# Patient Record
Sex: Female | Born: 1945 | Race: White | Hispanic: No | Marital: Married | Smoking: Never smoker
Health system: Southern US, Community
[De-identification: ages and names within clinical notes are randomized; demographics above are authoritative.]

## PROBLEM LIST (undated history)

## (undated) DIAGNOSIS — C50919 Malignant neoplasm of unspecified site of unspecified female breast: Secondary | ICD-10-CM

## (undated) DIAGNOSIS — M5416 Radiculopathy, lumbar region: Secondary | ICD-10-CM

## (undated) DIAGNOSIS — Z923 Personal history of irradiation: Secondary | ICD-10-CM

## (undated) DIAGNOSIS — M533 Sacrococcygeal disorders, not elsewhere classified: Secondary | ICD-10-CM

## (undated) DIAGNOSIS — E039 Hypothyroidism, unspecified: Secondary | ICD-10-CM

## (undated) DIAGNOSIS — E669 Obesity, unspecified: Secondary | ICD-10-CM

## (undated) DIAGNOSIS — G894 Chronic pain syndrome: Secondary | ICD-10-CM

## (undated) DIAGNOSIS — E538 Deficiency of other specified B group vitamins: Secondary | ICD-10-CM

## (undated) DIAGNOSIS — E119 Type 2 diabetes mellitus without complications: Secondary | ICD-10-CM

## (undated) DIAGNOSIS — D649 Anemia, unspecified: Secondary | ICD-10-CM

## (undated) DIAGNOSIS — I119 Hypertensive heart disease without heart failure: Secondary | ICD-10-CM

## (undated) DIAGNOSIS — I5189 Other ill-defined heart diseases: Secondary | ICD-10-CM

## (undated) DIAGNOSIS — M51369 Other intervertebral disc degeneration, lumbar region without mention of lumbar back pain or lower extremity pain: Secondary | ICD-10-CM

## (undated) DIAGNOSIS — Z9841 Cataract extraction status, right eye: Secondary | ICD-10-CM

## (undated) DIAGNOSIS — R0609 Other forms of dyspnea: Secondary | ICD-10-CM

## (undated) DIAGNOSIS — M5136 Other intervertebral disc degeneration, lumbar region: Secondary | ICD-10-CM

## (undated) DIAGNOSIS — C44711 Basal cell carcinoma of skin of unspecified lower limb, including hip: Secondary | ICD-10-CM

## (undated) DIAGNOSIS — E8809 Other disorders of plasma-protein metabolism, not elsewhere classified: Secondary | ICD-10-CM

## (undated) DIAGNOSIS — M1990 Unspecified osteoarthritis, unspecified site: Secondary | ICD-10-CM

## (undated) DIAGNOSIS — M7750 Other enthesopathy of unspecified foot: Secondary | ICD-10-CM

## (undated) DIAGNOSIS — E0590 Thyrotoxicosis, unspecified without thyrotoxic crisis or storm: Secondary | ICD-10-CM

## (undated) DIAGNOSIS — E559 Vitamin D deficiency, unspecified: Secondary | ICD-10-CM

## (undated) DIAGNOSIS — R600 Localized edema: Secondary | ICD-10-CM

## (undated) DIAGNOSIS — I499 Cardiac arrhythmia, unspecified: Secondary | ICD-10-CM

## (undated) DIAGNOSIS — I493 Ventricular premature depolarization: Secondary | ICD-10-CM

## (undated) DIAGNOSIS — M5126 Other intervertebral disc displacement, lumbar region: Secondary | ICD-10-CM

## (undated) DIAGNOSIS — I10 Essential (primary) hypertension: Secondary | ICD-10-CM

## (undated) DIAGNOSIS — E079 Disorder of thyroid, unspecified: Secondary | ICD-10-CM

## (undated) DIAGNOSIS — I890 Lymphedema, not elsewhere classified: Secondary | ICD-10-CM

## (undated) DIAGNOSIS — C801 Malignant (primary) neoplasm, unspecified: Secondary | ICD-10-CM

## (undated) DIAGNOSIS — M461 Sacroiliitis, not elsewhere classified: Secondary | ICD-10-CM

## (undated) DIAGNOSIS — R011 Cardiac murmur, unspecified: Secondary | ICD-10-CM

## (undated) DIAGNOSIS — M47816 Spondylosis without myelopathy or radiculopathy, lumbar region: Secondary | ICD-10-CM

## (undated) DIAGNOSIS — E785 Hyperlipidemia, unspecified: Secondary | ICD-10-CM

## (undated) DIAGNOSIS — I272 Pulmonary hypertension, unspecified: Secondary | ICD-10-CM

## (undated) DIAGNOSIS — M179 Osteoarthritis of knee, unspecified: Secondary | ICD-10-CM

## (undated) DIAGNOSIS — D0511 Intraductal carcinoma in situ of right breast: Secondary | ICD-10-CM

## (undated) DIAGNOSIS — M48061 Spinal stenosis, lumbar region without neurogenic claudication: Secondary | ICD-10-CM

## (undated) DIAGNOSIS — N183 Chronic kidney disease, stage 3 unspecified: Secondary | ICD-10-CM

## (undated) DIAGNOSIS — I071 Rheumatic tricuspid insufficiency: Secondary | ICD-10-CM

## (undated) DIAGNOSIS — C44309 Unspecified malignant neoplasm of skin of other parts of face: Secondary | ICD-10-CM

## (undated) HISTORY — DX: Type 2 diabetes mellitus without complications: E11.9

## (undated) HISTORY — PX: EYE SURGERY: SHX253

## (undated) HISTORY — PX: TOTAL KNEE ARTHROPLASTY: SHX125

## (undated) HISTORY — DX: Disorder of thyroid, unspecified: E07.9

## (undated) HISTORY — PX: KNEE ARTHROSCOPY: SUR90

## (undated) HISTORY — PX: KNEE SURGERY: SHX244

## (undated) HISTORY — DX: Rheumatic tricuspid insufficiency: I07.1

## (undated) HISTORY — DX: Osteoarthritis of knee, unspecified: M17.9

## (undated) HISTORY — DX: Hypertensive heart disease without heart failure: I11.9

## (undated) HISTORY — DX: Hyperlipidemia, unspecified: E78.5

## (undated) HISTORY — PX: SKIN CANCER EXCISION: SHX779

## (undated) HISTORY — DX: Sacroiliitis, not elsewhere classified: M46.1

## (undated) HISTORY — PX: CATARACT EXTRACTION: SUR2

## (undated) HISTORY — DX: Cardiac arrhythmia, unspecified: I49.9

## (undated) HISTORY — DX: Essential (primary) hypertension: I10

## (undated) HISTORY — DX: Vitamin D deficiency, unspecified: E55.9

## (undated) HISTORY — PX: JOINT REPLACEMENT: SHX530

## (undated) HISTORY — DX: Malignant neoplasm of unspecified site of unspecified female breast: C50.919

## (undated) HISTORY — DX: Obesity, unspecified: E66.9

## (undated) HISTORY — DX: Intraductal carcinoma in situ of right breast: D05.11

---

## 2000-11-20 ENCOUNTER — Other Ambulatory Visit: Admission: RE | Admit: 2000-11-20 | Discharge: 2000-11-20 | Payer: Self-pay | Admitting: Family Medicine

## 2005-04-18 ENCOUNTER — Ambulatory Visit: Payer: Self-pay | Admitting: General Practice

## 2005-05-27 ENCOUNTER — Other Ambulatory Visit: Payer: Self-pay

## 2005-06-03 ENCOUNTER — Ambulatory Visit: Payer: Self-pay | Admitting: General Practice

## 2006-07-14 ENCOUNTER — Ambulatory Visit: Payer: Self-pay

## 2007-06-26 ENCOUNTER — Ambulatory Visit: Payer: Self-pay | Admitting: Sports Medicine

## 2007-06-26 ENCOUNTER — Encounter (INDEPENDENT_AMBULATORY_CARE_PROVIDER_SITE_OTHER): Payer: Self-pay | Admitting: *Deleted

## 2007-06-26 DIAGNOSIS — M7750 Other enthesopathy of unspecified foot: Secondary | ICD-10-CM

## 2007-10-16 ENCOUNTER — Encounter: Payer: Self-pay | Admitting: Family Medicine

## 2007-10-16 ENCOUNTER — Ambulatory Visit: Payer: Self-pay | Admitting: Sports Medicine

## 2007-10-17 ENCOUNTER — Encounter: Payer: Self-pay | Admitting: Sports Medicine

## 2007-11-14 ENCOUNTER — Ambulatory Visit: Payer: Self-pay | Admitting: Sports Medicine

## 2007-11-14 DIAGNOSIS — M7660 Achilles tendinitis, unspecified leg: Secondary | ICD-10-CM

## 2007-11-14 DIAGNOSIS — E669 Obesity, unspecified: Secondary | ICD-10-CM | POA: Insufficient documentation

## 2008-01-21 ENCOUNTER — Ambulatory Visit: Payer: Self-pay | Admitting: Family Medicine

## 2008-03-12 ENCOUNTER — Ambulatory Visit: Payer: Self-pay | Admitting: Sports Medicine

## 2008-04-22 LAB — HM DEXA SCAN: HM Dexa Scan: NORMAL

## 2008-04-23 ENCOUNTER — Ambulatory Visit: Payer: Self-pay | Admitting: Sports Medicine

## 2008-04-23 LAB — HM PAP SMEAR: HM Pap smear: NORMAL

## 2008-05-08 ENCOUNTER — Ambulatory Visit: Payer: Self-pay | Admitting: Family Medicine

## 2009-05-19 ENCOUNTER — Ambulatory Visit: Payer: Self-pay | Admitting: Family Medicine

## 2010-02-16 ENCOUNTER — Ambulatory Visit: Payer: Self-pay | Admitting: Family Medicine

## 2010-08-23 ENCOUNTER — Encounter: Payer: Self-pay | Admitting: *Deleted

## 2011-07-11 ENCOUNTER — Ambulatory Visit: Payer: Self-pay | Admitting: General Practice

## 2011-07-11 LAB — CBC
HGB: 12.1 g/dL (ref 12.0–16.0)
MCH: 27.8 pg (ref 26.0–34.0)
Platelet: 258 10*3/uL (ref 150–440)

## 2011-07-11 LAB — BASIC METABOLIC PANEL
Anion Gap: 13 (ref 7–16)
Creatinine: 1.03 mg/dL (ref 0.60–1.30)
Glucose: 221 mg/dL — ABNORMAL HIGH (ref 65–99)
Osmolality: 292 (ref 275–301)
Sodium: 142 mmol/L (ref 136–145)

## 2011-07-11 LAB — PROTIME-INR
INR: 1
Prothrombin Time: 13.4 secs (ref 11.5–14.7)

## 2011-07-11 LAB — URINALYSIS, COMPLETE
Glucose,UR: NEGATIVE mg/dL (ref 0–75)
Hyaline Cast: 6
Ketone: NEGATIVE
Nitrite: NEGATIVE
RBC,UR: NONE SEEN /HPF (ref 0–5)
Specific Gravity: 1.015 (ref 1.003–1.030)
WBC UR: 2 /HPF (ref 0–5)

## 2011-07-11 LAB — SEDIMENTATION RATE: Erythrocyte Sed Rate: 20 mm/hr (ref 0–30)

## 2011-07-11 LAB — MRSA PCR SCREENING

## 2011-07-12 LAB — URINE CULTURE

## 2011-07-25 ENCOUNTER — Inpatient Hospital Stay: Payer: Self-pay | Admitting: General Practice

## 2011-07-26 LAB — BASIC METABOLIC PANEL
Anion Gap: 10 (ref 7–16)
Calcium, Total: 8.4 mg/dL — ABNORMAL LOW (ref 8.5–10.1)
Chloride: 109 mmol/L — ABNORMAL HIGH (ref 98–107)
Creatinine: 1.13 mg/dL (ref 0.60–1.30)
EGFR (Non-African Amer.): 52 — ABNORMAL LOW
Glucose: 125 mg/dL — ABNORMAL HIGH (ref 65–99)
Osmolality: 292 (ref 275–301)
Potassium: 4 mmol/L (ref 3.5–5.1)
Sodium: 143 mmol/L (ref 136–145)

## 2011-07-26 LAB — PLATELET COUNT: Platelet: 230 10*3/uL (ref 150–440)

## 2011-07-26 LAB — HEMOGLOBIN: HGB: 10.4 g/dL — ABNORMAL LOW (ref 12.0–16.0)

## 2011-07-27 LAB — BASIC METABOLIC PANEL
Anion Gap: 12 (ref 7–16)
BUN: 29 mg/dL — ABNORMAL HIGH (ref 7–18)
Calcium, Total: 8.2 mg/dL — ABNORMAL LOW (ref 8.5–10.1)
Co2: 19 mmol/L — ABNORMAL LOW (ref 21–32)
Creatinine: 1.32 mg/dL — ABNORMAL HIGH (ref 0.60–1.30)
Glucose: 166 mg/dL — ABNORMAL HIGH (ref 65–99)
Potassium: 4.1 mmol/L (ref 3.5–5.1)

## 2011-07-27 LAB — HEMOGLOBIN: HGB: 10.1 g/dL — ABNORMAL LOW (ref 12.0–16.0)

## 2011-07-28 ENCOUNTER — Encounter: Payer: Self-pay | Admitting: Internal Medicine

## 2011-07-28 LAB — BASIC METABOLIC PANEL
Anion Gap: 11 (ref 7–16)
Calcium, Total: 8.5 mg/dL (ref 8.5–10.1)
Chloride: 106 mmol/L (ref 98–107)
EGFR (African American): 54 — ABNORMAL LOW
Osmolality: 290 (ref 275–301)
Potassium: 4.2 mmol/L (ref 3.5–5.1)

## 2011-08-01 ENCOUNTER — Encounter: Payer: Self-pay | Admitting: Internal Medicine

## 2011-08-30 ENCOUNTER — Ambulatory Visit: Payer: Self-pay | Admitting: Family Medicine

## 2011-08-31 ENCOUNTER — Encounter: Payer: Self-pay | Admitting: Internal Medicine

## 2011-08-31 ENCOUNTER — Ambulatory Visit: Payer: Self-pay | Admitting: Family Medicine

## 2011-08-31 LAB — CREATININE, SERUM
Creatinine: 1.17 mg/dL (ref 0.60–1.30)
EGFR (Non-African Amer.): 49 — ABNORMAL LOW

## 2011-11-07 DIAGNOSIS — M7989 Other specified soft tissue disorders: Secondary | ICD-10-CM | POA: Diagnosis not present

## 2011-11-07 DIAGNOSIS — I890 Lymphedema, not elsewhere classified: Secondary | ICD-10-CM | POA: Diagnosis not present

## 2011-11-07 DIAGNOSIS — I872 Venous insufficiency (chronic) (peripheral): Secondary | ICD-10-CM | POA: Diagnosis not present

## 2011-11-07 DIAGNOSIS — M79609 Pain in unspecified limb: Secondary | ICD-10-CM | POA: Diagnosis not present

## 2011-11-09 DIAGNOSIS — Z1331 Encounter for screening for depression: Secondary | ICD-10-CM | POA: Diagnosis not present

## 2011-11-09 DIAGNOSIS — Z1339 Encounter for screening examination for other mental health and behavioral disorders: Secondary | ICD-10-CM | POA: Diagnosis not present

## 2011-11-09 DIAGNOSIS — Z0000 Encounter for general adult medical examination without abnormal findings: Secondary | ICD-10-CM | POA: Diagnosis not present

## 2011-11-09 DIAGNOSIS — E7800 Pure hypercholesterolemia, unspecified: Secondary | ICD-10-CM | POA: Diagnosis not present

## 2011-11-09 DIAGNOSIS — E119 Type 2 diabetes mellitus without complications: Secondary | ICD-10-CM | POA: Diagnosis not present

## 2011-11-09 DIAGNOSIS — R0989 Other specified symptoms and signs involving the circulatory and respiratory systems: Secondary | ICD-10-CM | POA: Diagnosis not present

## 2011-11-11 DIAGNOSIS — D045 Carcinoma in situ of skin of trunk: Secondary | ICD-10-CM | POA: Diagnosis not present

## 2011-11-14 DIAGNOSIS — M999 Biomechanical lesion, unspecified: Secondary | ICD-10-CM | POA: Diagnosis not present

## 2011-11-14 DIAGNOSIS — M5380 Other specified dorsopathies, site unspecified: Secondary | ICD-10-CM | POA: Diagnosis not present

## 2011-11-14 DIAGNOSIS — M549 Dorsalgia, unspecified: Secondary | ICD-10-CM | POA: Diagnosis not present

## 2011-11-14 DIAGNOSIS — M5137 Other intervertebral disc degeneration, lumbosacral region: Secondary | ICD-10-CM | POA: Diagnosis not present

## 2011-12-06 ENCOUNTER — Ambulatory Visit: Payer: Self-pay | Admitting: Family Medicine

## 2011-12-06 DIAGNOSIS — Z1231 Encounter for screening mammogram for malignant neoplasm of breast: Secondary | ICD-10-CM | POA: Diagnosis not present

## 2011-12-06 LAB — HM MAMMOGRAPHY: HM MAMMO: NORMAL

## 2012-01-19 ENCOUNTER — Ambulatory Visit: Payer: Self-pay | Admitting: Gastroenterology

## 2012-01-19 LAB — HM COLONOSCOPY: HM Colonoscopy: NORMAL

## 2012-02-07 DIAGNOSIS — M549 Dorsalgia, unspecified: Secondary | ICD-10-CM | POA: Diagnosis not present

## 2012-02-07 DIAGNOSIS — M5137 Other intervertebral disc degeneration, lumbosacral region: Secondary | ICD-10-CM | POA: Diagnosis not present

## 2012-02-07 DIAGNOSIS — M999 Biomechanical lesion, unspecified: Secondary | ICD-10-CM | POA: Diagnosis not present

## 2012-02-07 DIAGNOSIS — M5380 Other specified dorsopathies, site unspecified: Secondary | ICD-10-CM | POA: Diagnosis not present

## 2012-02-27 ENCOUNTER — Ambulatory Visit: Payer: Self-pay | Admitting: Family Medicine

## 2012-07-18 DIAGNOSIS — E1139 Type 2 diabetes mellitus with other diabetic ophthalmic complication: Secondary | ICD-10-CM | POA: Diagnosis not present

## 2013-06-10 DIAGNOSIS — D485 Neoplasm of uncertain behavior of skin: Secondary | ICD-10-CM | POA: Diagnosis not present

## 2013-07-22 ENCOUNTER — Ambulatory Visit: Payer: Self-pay | Admitting: General Practice

## 2013-12-11 DIAGNOSIS — I493 Ventricular premature depolarization: Secondary | ICD-10-CM | POA: Insufficient documentation

## 2014-02-14 ENCOUNTER — Other Ambulatory Visit: Payer: Self-pay

## 2014-02-24 DIAGNOSIS — Z23 Encounter for immunization: Secondary | ICD-10-CM | POA: Diagnosis not present

## 2014-02-27 DIAGNOSIS — D2261 Melanocytic nevi of right upper limb, including shoulder: Secondary | ICD-10-CM | POA: Diagnosis not present

## 2014-02-27 DIAGNOSIS — D225 Melanocytic nevi of trunk: Secondary | ICD-10-CM | POA: Diagnosis not present

## 2014-02-27 DIAGNOSIS — D2262 Melanocytic nevi of left upper limb, including shoulder: Secondary | ICD-10-CM | POA: Diagnosis not present

## 2014-02-27 DIAGNOSIS — Z85828 Personal history of other malignant neoplasm of skin: Secondary | ICD-10-CM | POA: Diagnosis not present

## 2014-02-27 DIAGNOSIS — C4441 Basal cell carcinoma of skin of scalp and neck: Secondary | ICD-10-CM | POA: Diagnosis not present

## 2014-02-27 DIAGNOSIS — D485 Neoplasm of uncertain behavior of skin: Secondary | ICD-10-CM | POA: Diagnosis not present

## 2014-03-17 DIAGNOSIS — E039 Hypothyroidism, unspecified: Secondary | ICD-10-CM | POA: Diagnosis not present

## 2014-03-17 DIAGNOSIS — M1990 Unspecified osteoarthritis, unspecified site: Secondary | ICD-10-CM | POA: Diagnosis not present

## 2014-03-17 DIAGNOSIS — E780 Pure hypercholesterolemia: Secondary | ICD-10-CM | POA: Diagnosis not present

## 2014-03-17 DIAGNOSIS — I10 Essential (primary) hypertension: Secondary | ICD-10-CM | POA: Diagnosis not present

## 2014-03-17 DIAGNOSIS — E669 Obesity, unspecified: Secondary | ICD-10-CM | POA: Diagnosis not present

## 2014-03-17 DIAGNOSIS — E785 Hyperlipidemia, unspecified: Secondary | ICD-10-CM | POA: Diagnosis not present

## 2014-03-17 DIAGNOSIS — E119 Type 2 diabetes mellitus without complications: Secondary | ICD-10-CM | POA: Diagnosis not present

## 2014-03-17 DIAGNOSIS — E559 Vitamin D deficiency, unspecified: Secondary | ICD-10-CM | POA: Diagnosis not present

## 2014-03-17 LAB — CBC AND DIFFERENTIAL
HEMATOCRIT: 34 % — AB (ref 36–46)
HEMOGLOBIN: 10.8 g/dL — AB (ref 12.0–16.0)
NEUTROS ABS: 61 /uL
PLATELETS: 276 10*3/uL (ref 150–399)
WBC: 10.8 10^3/mL

## 2014-03-17 LAB — BASIC METABOLIC PANEL
BUN: 28 mg/dL — AB (ref 4–21)
Creatinine: 1.1 mg/dL (ref <=1.1)
GLUCOSE: 103 mg/dL
Potassium: 4.9 mmol/L (ref 3.4–5.3)
SODIUM: 142 mmol/L (ref 137–147)

## 2014-03-17 LAB — HEPATIC FUNCTION PANEL
ALT: 7 U/L (ref 7–35)
AST: 16 U/L (ref 13–35)
Alkaline Phosphatase: 63 U/L (ref 25–125)
Bilirubin, Total: 0.4 mg/dL

## 2014-03-17 LAB — LIPID PANEL
CHOLESTEROL: 121 mg/dL (ref 0–200)
HDL: 61 mg/dL (ref 35–70)
LDL CALC: 47 mg/dL
LDL/HDL RATIO: 0.8
TRIGLYCERIDES: 65 mg/dL (ref 40–160)

## 2014-03-17 LAB — TSH: TSH: 2.41 u[IU]/mL (ref <=5.90)

## 2014-03-17 LAB — HEMOGLOBIN A1C: HEMOGLOBIN A1C: 6.6 % — AB (ref 4.0–6.0)

## 2014-04-22 DIAGNOSIS — C4441 Basal cell carcinoma of skin of scalp and neck: Secondary | ICD-10-CM | POA: Diagnosis not present

## 2014-07-03 DIAGNOSIS — D2262 Melanocytic nevi of left upper limb, including shoulder: Secondary | ICD-10-CM | POA: Diagnosis not present

## 2014-07-03 DIAGNOSIS — D2261 Melanocytic nevi of right upper limb, including shoulder: Secondary | ICD-10-CM | POA: Diagnosis not present

## 2014-07-03 DIAGNOSIS — D225 Melanocytic nevi of trunk: Secondary | ICD-10-CM | POA: Diagnosis not present

## 2014-07-03 DIAGNOSIS — Z85828 Personal history of other malignant neoplasm of skin: Secondary | ICD-10-CM | POA: Diagnosis not present

## 2014-07-08 DIAGNOSIS — E780 Pure hypercholesterolemia: Secondary | ICD-10-CM | POA: Diagnosis not present

## 2014-07-08 DIAGNOSIS — E119 Type 2 diabetes mellitus without complications: Secondary | ICD-10-CM | POA: Diagnosis not present

## 2014-07-08 DIAGNOSIS — I10 Essential (primary) hypertension: Secondary | ICD-10-CM | POA: Diagnosis not present

## 2014-07-08 DIAGNOSIS — M1990 Unspecified osteoarthritis, unspecified site: Secondary | ICD-10-CM | POA: Diagnosis not present

## 2014-07-08 DIAGNOSIS — Z96652 Presence of left artificial knee joint: Secondary | ICD-10-CM | POA: Diagnosis not present

## 2014-07-08 DIAGNOSIS — E669 Obesity, unspecified: Secondary | ICD-10-CM | POA: Diagnosis not present

## 2014-08-05 DIAGNOSIS — E11329 Type 2 diabetes mellitus with mild nonproliferative diabetic retinopathy without macular edema: Secondary | ICD-10-CM | POA: Diagnosis not present

## 2014-08-05 DIAGNOSIS — H524 Presbyopia: Secondary | ICD-10-CM | POA: Diagnosis not present

## 2014-08-24 NOTE — Op Note (Signed)
PATIENT NAME:  Carol Ramsey, Carol Ramsey MR#:  [id number] DATE OF BIRTH:  [birth year]  DATE OF PROCEDURE:  07/25/2011  PREOPERATIVE DIAGNOSIS: Degenerative arthrosis of the left knee.   POSTOPERATIVE DIAGNOSIS: Degenerative arthrosis of the left knee.   PROCEDURE PERFORMED: Left total knee arthroplasty using computer-assisted navigation.   SURGEON: Laurice Record. Holley Bouche., MD    ASSISTANT: Vance Peper, PA-C (required to maintain retraction throughout the procedure)   ANESTHESIA: Femoral nerve block and general.   ESTIMATED BLOOD LOSS: 50 mL.   FLUIDS REPLACED: 2000 mL of Crystalloid.   TOURNIQUET TIME: 92 minutes.   DRAINS: Two medium drains to reinfusion system.   SOFT TISSUE RELEASES: Anterior cruciate ligament, posterior cruciate ligament, deep medial collateral ligament, and patellofemoral ligament.   IMPLANTS UTILIZED: DePuy PFC Sigma size 3 posterior stabilized femoral component (cemented), size 3 MBT tibial component (cemented), 32 mm three peg oval dome patella (cemented), and a 12.5 mm stabilized rotating platform polyethylene insert.   INDICATIONS FOR SURGERY: The patient is a 69 year old female who has been seen for complaints of progressive left knee pain. X-rays demonstrated severe degenerative changes in a tricompartmental fashion with relative valgus deformity. After discussion of the risks and benefits of surgical intervention, the patient expressed her understanding of the risks and benefits and agreed with plans for surgical intervention.   PROCEDURE IN DETAIL: The patient was brought into the operating room and, after adequate femoral nerve block and general anesthesia was achieved, a tourniquet was placed on the patient's upper left thigh. The patient's left knee and leg were cleaned and prepped with alcohol and DuraPrep and draped in the usual sterile fashion. A "time-out" was performed as per usual protocol. The left lower extremity was exsanguinated using an Esmarch and the  tourniquet was inflated to 300 mmHg. Anterior longitudinal incision was made followed by a standard mid vastus approach. A large effusion was evacuated. Deep fibers of the medial collateral ligament were elevated in a subperiosteal fashion off the medial flare of the tibia so as to maintain a continuous soft tissue sleeve. The patella was subluxed laterally and the patellofemoral ligament was incised. Inspection of the knee demonstrated severe degenerative changes with evidence of eburnated bone noted to the lateral compartment. Prominent osteophytes were debrided using a rongeur. Anterior and posterior cruciate ligaments were excised. Two 4.0 mm Schanz pins were inserted into the femur and into the tibia for attachment of the array of spheres used for computer-assisted navigation. Hip center was identified using circumduction technique. Distal landmarks were mapped using the computer. Distal femur and proximal tibia were mapped using the computer. Distal femoral cutting guide was positioned using computer-assisted navigation so as to achieve 5 degree distal valgus cut. Cut was performed and verified using the computer. Distal femur was sized and it was felt that a size 3 femoral component was appropriate. Size 3 cutting guide was positioned using computer-assisted navigation and the anterior cut was performed and verified using the computer. This was followed by completion of the posterior and chamfer cuts. Femoral cutting guide for central box was then positioned and central box cut was performed.   Attention was then directed to the proximal tibia. Medial and lateral menisci were excised. The extramedullary tibial cutting guide was positioned using computer-assisted navigation so as to achieve 0 degree varus valgus alignment and 0 degree posterior slope. Cut was performed and verified using the computer. The proximal tibia was sized and it was felt that a size 3 tibial tray was appropriate. Tibial  and femoral  trials were inserted followed by insertion of first a 10 and subsequently a 12.5 mm polyethylene trial. This allowed for excellent mediolateral soft tissue balancing both in full extension and in 90 degrees of flexion. Finally, the patella was cut and prepared so as to accommodate a 32 mm three peg oval dome patella. Patellar trial was placed and the knee was placed through a range of motion with excellent patellar tracking appreciated.   Femoral trial was removed. Central post hole for the tibial component was reamed followed by insertion of keel punch. Tibial trial was then removed. Cut surfaces of bone were irrigated with copious amounts of normal saline with antibiotic solution using pulsatile lavage and then suctioned dry. Polymethyl methacrylate cement with gentamicin was mixed in the usual fashion using a vacuum mixer. Cement with gentamicin was utilized due to the patient's history of diabetes as well as elevated body mass index. Cement was applied to the cut surface of the proximal tibia as well as along the undersurface of a size 3 MBT tibial component. Tibial component was positioned and impacted into place. Excess cement was removed using freer elevators. Cement was then applied to the cut surface of the femur as well as along the posterior flanges of size 3 posterior stabilized femoral component. Femoral component was positioned and impacted into place. Excess cement was removed using freer elevators. A 12.5 mm stabilized polyethylene trial was inserted and the knee was brought in full extension with steady axial compression applied. Finally, cement was applied to the backside of a 32 mm three peg oval dome patella and patellar component was positioned and patellar clamp applied. Excess cement was removed using freer elevators.   After adequate curing of cement, the tourniquet was deflated after a total tourniquet time of 92 minutes. Hemostasis was achieved using electrocautery. Knee was irrigated  with copious amounts of normal saline with antibiotic solution using pulsatile lavage and then suctioned dry. The knee was inspected for any residual cement debris. 30 mL of 0.25% Marcaine with epinephrine was injected along the posterior capsule. A 12.5 mm stabilized rotating platform polyethylene insert was positioned and the knee was placed through a range of motion. Excellent patellar tracking was appreciated and excellent mediolateral soft tissue balancing was appreciated.   Two medium drains were placed in the wound bed and brought out through a separate stab incision to be attached to a reinfusion system. The medial parapatellar portion of the incision was reapproximated using interrupted sutures of #1 Vicryl. Subcutaneous tissue was approximated in layers using first #0 Vicryl followed by #2-0 Vicryl. Skin was closed with skin staples. Sterile dressing was applied.   The patient tolerated the procedure well. She was transported to the recovery room in stable condition.   ____________________________ Laurice Record. Holley Bouche., MD [id number] D: 07/25/2011 22:29:19 ET T: 07/26/2011 10:25:13 ET JOB#: [id number]  cc: Laurice Record. Holley Bouche., MD, <Dictator> Laurice Record Holley Bouche MD ELECTRONICALLY SIGNED 07/27/2011 15:53

## 2014-08-24 NOTE — Discharge Summary (Signed)
PATIENT NAME:  Carol Ramsey, BISPING MR#:  [id number] DATE OF BIRTH:  December 08, 1945  DATE OF ADMISSION:  07/25/2011 DATE OF DISCHARGE:  07/28/2011  ADMITTING DIAGNOSIS: Degenerative arthrosis of the left knee.   DISCHARGE DIAGNOSIS: Degenerative arthrosis of the left knee.  HISTORY OF PRESENT ILLNESS: The patient is a pleasant 69 year old who has been followed at [facility] for progression of left knee pain. She reported approximately a one-year history of progressive left knee pain. She also had some similar but less severe pain to the right knee. She had localized most of the pain along the lateral aspect of the knee. Her pain was aggravated with weight-bearing activities. The patient also reported start-up stiffness and episodes of near giving way. She had not seen any significant improvement in her condition despite the use of anti-inflammatory medications. At the time of surgery, she was not using any ambulatory aid. The pain progressed to the point that it was significantly interfering with her activities of daily living. X-rays taken in the clinic showed narrowing of the lateral compartment space with associated valgus alignment. Osteophyte as well as subchondral sclerosis was noted. After discussion of the risks and benefits of surgical intervention, the patient expressed her understanding of the risks and benefits and agreed with plans for surgical intervention.   PROCEDURE: Left total knee arthroplasty using computer-assisted navigation.   ANESTHESIA: Femoral nerve block with general.   SOFT TISSUE RELEASE: Anterior cruciate ligament, posterior cruciate ligament, deep medial collateral ligament, as well as the patellofemoral ligament.   IMPLANTS UTILIZED: DePuy PFC Sigma size 3 posterior stabilized femoral component (cemented), size 3 MBT tibial component (cemented), 32 mm three pegged oval dome patella (cemented), and a 12.5 mm stabilized rotating platform polyethylene insert.   HOSPITAL  COURSE: The patient tolerated the procedure very well. She had no complications. She was then taken to the PAC-U where she was stabilized and then transferred to the orthopedic floor. She began receiving anticoagulation therapy of Lovenox 30 mg subcutaneous every 12 hours per anesthesia and pharmacy protocol. She was fitted with TED stockings bilaterally. These were allowed to be removed one hour per eight hour shift. The left one was applied on day two following removal of the Hemovac and dressing change. The patient was also fitted with the AV-I compression foot pumps bilaterally set at 80 mmHg. Her calves have been nontender and free of any evidence of any deep venous thromboses to the lower extremity. Heels were elevated off the bed using rolled towels. Heels have been nontender and there was no tissue breakdown noted.   The patient has denied any chest pain or shortness of breath. She has had no other complaints as well. Her vital signs have been stable. She has been afebrile. Hemodynamically she was stable and no transfusions were given, other than the Autovac transfusion given the first six hours postoperatively. Her laboratory studies did show some elevation of her BUN and creatinine and after some discussion with her it appeared that she had not been drinking a lot of fluids. She began taking fluids and BUN and creatinine improved. Her urine output however was within normal limits throughout the hospital course.   The patient began receiving physical therapy on day one for gait training and transfers. She has been a little slow with this. This is partially secondary to her size and deconditioning. Occupational therapy was also initiated for activities of daily living and assistive devices.   The patient's IV, catheter, and Hemovac were discontinued on day two  along with a dressing change. Polar Care was reapplied to the surgical leg maintaining a temperature of 40 to 50 degrees Fahrenheit.    DISPOSITION: The patient is being discharged to a skilled nursing facility in improved stable condition.   DISCHARGE INSTRUCTIONS:  1. She will continue with physical therapy for gait training and transfers. Occupational therapy for activities of daily living and assistive devices.  2. Continue TED stockings. These are allowed to be removed one hour per eight hour shift.  3. Incentive spirometer every 1 hour while awake.  4. Encourage cough and deep breathing every two hours while awake.  5. She is placed on an ADA 1800 calorie diet.  6. Knee immobilizer until she is able to do 10 straight leg raises.  7. She may weight bear as tolerated.  8. Polar Care to the surgical leg maintaining a temperature of 40 to 50 degrees Fahrenheit.  9. She has a follow-up appointment on 08/09/2011 at 9:00 a.m. with Vance Peper, PA and on 09/06/2011 at 9:30 with Dr. Skip Estimable. She is to call the clinic sooner if any temperatures of 101.5 or greater or excessive bleeding.   DRUG ALLERGIES: Amoxicillin, diclofenac, quinine.   DISCHARGE MEDICATIONS:  1. Naphazoline-glycerine 0.012-0.2% eyedrops, one drop to both eyes every 4 hours p.r.n. for eye irritation.  2. Tylenol ES 500 to 1000 mg every four hours p.r.n. for temperatures of 100.4 or greater.  3. Celebrex 200 mg twice a day. 4. Roxicodone 5 to 10 mg every 4 to 6 hours p.r.n. 5. Tramadol 50 to 100 mg every four hours p.r.n.  6. Dulcolax suppositories 10 mg rectally daily p.r.n. for constipation.  7. Milk of Magnesia 30 mL twice a day p.r.n.  8. Enema soapsuds if no results with milk of magnesia or Dulcolax.  9. Mylanta DS 30 mL every six hours p.r.n.  10. Pantoprazole 40 mg twice a day. 11. Senokot-S 1 tablet twice a day. 12. Pioglitazone 45 mg daily with meals. 13. Glucotrol XL 10 mg before breakfast.  14. Lovenox 30 mg subcutaneous every 12 hours for 14 days then discontinue and begin taking one 81 mg enteric-coated aspirin per day.  15. Benadryl  25 mg every six hours p.r.n.  16. Lactulose 10 grams/15 mL syrup, 30 mL twice a day p.r.n.  17. Insulin sliding scale, Novolin R. 18. Glucophage 1000 mg twice a day.  19. Januvia 50 mg usual schedule every day at 0700 and 1700. 20. Norvasc 5 mg daily. 21. Lisinopril 20 mg/ HCTZ 12.5 mg two tablets every  a.m.  22. Vitamin D3 1,000 units daily.   PAST MEDICAL HISTORY:  1. Chickenpox.  2. Hypertension.  3. Diabetes.  4. 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      5. Seasonal allergies.  6. Arthritis.  ____________________________ Vance Peper, PA [id number] D: 07/28/2011 06:59:00 ET T: 07/28/2011 07:18:51 ET JOB#: [id number]  cc: Vance Peper, PA, <Dictator> JON WOLFE PA ELECTRONICALLY SIGNED 07/31/2011 20:21

## 2014-09-17 DIAGNOSIS — E785 Hyperlipidemia, unspecified: Secondary | ICD-10-CM | POA: Insufficient documentation

## 2014-09-17 DIAGNOSIS — M47816 Spondylosis without myelopathy or radiculopathy, lumbar region: Secondary | ICD-10-CM | POA: Insufficient documentation

## 2014-09-17 DIAGNOSIS — I499 Cardiac arrhythmia, unspecified: Secondary | ICD-10-CM | POA: Insufficient documentation

## 2014-09-17 DIAGNOSIS — E119 Type 2 diabetes mellitus without complications: Secondary | ICD-10-CM | POA: Insufficient documentation

## 2014-09-17 DIAGNOSIS — D649 Anemia, unspecified: Secondary | ICD-10-CM | POA: Insufficient documentation

## 2014-09-17 DIAGNOSIS — F4320 Adjustment disorder, unspecified: Secondary | ICD-10-CM | POA: Insufficient documentation

## 2014-09-17 DIAGNOSIS — E559 Vitamin D deficiency, unspecified: Secondary | ICD-10-CM | POA: Insufficient documentation

## 2014-09-17 DIAGNOSIS — E1122 Type 2 diabetes mellitus with diabetic chronic kidney disease: Secondary | ICD-10-CM | POA: Insufficient documentation

## 2014-09-17 DIAGNOSIS — C44309 Unspecified malignant neoplasm of skin of other parts of face: Secondary | ICD-10-CM | POA: Insufficient documentation

## 2014-09-17 DIAGNOSIS — E039 Hypothyroidism, unspecified: Secondary | ICD-10-CM | POA: Insufficient documentation

## 2014-09-17 DIAGNOSIS — I10 Essential (primary) hypertension: Secondary | ICD-10-CM | POA: Insufficient documentation

## 2014-09-17 DIAGNOSIS — E668 Other obesity: Secondary | ICD-10-CM | POA: Insufficient documentation

## 2014-10-21 ENCOUNTER — Other Ambulatory Visit: Payer: Self-pay

## 2014-10-21 ENCOUNTER — Other Ambulatory Visit: Payer: Self-pay | Admitting: Family Medicine

## 2014-10-21 DIAGNOSIS — M5442 Lumbago with sciatica, left side: Secondary | ICD-10-CM | POA: Diagnosis not present

## 2014-10-21 DIAGNOSIS — M5416 Radiculopathy, lumbar region: Secondary | ICD-10-CM | POA: Diagnosis not present

## 2014-10-21 DIAGNOSIS — M9905 Segmental and somatic dysfunction of pelvic region: Secondary | ICD-10-CM | POA: Diagnosis not present

## 2014-10-21 DIAGNOSIS — M791 Myalgia: Secondary | ICD-10-CM | POA: Diagnosis not present

## 2014-10-21 DIAGNOSIS — E118 Type 2 diabetes mellitus with unspecified complications: Secondary | ICD-10-CM

## 2014-10-21 DIAGNOSIS — M9903 Segmental and somatic dysfunction of lumbar region: Secondary | ICD-10-CM | POA: Diagnosis not present

## 2014-10-21 DIAGNOSIS — M5136 Other intervertebral disc degeneration, lumbar region: Secondary | ICD-10-CM | POA: Diagnosis not present

## 2014-10-21 MED ORDER — PIOGLITAZONE HCL 45 MG PO TABS: 45.0000 mg | ORAL_TABLET | Freq: Every day | ORAL | Status: DC

## 2014-10-21 MED ORDER — SITAGLIPTIN PHOS-METFORMIN HCL 50-1000 MG PO TABS: 1.0000 | ORAL_TABLET | Freq: Two times a day (BID) | ORAL | Status: DC

## 2014-10-22 DIAGNOSIS — M791 Myalgia: Secondary | ICD-10-CM | POA: Diagnosis not present

## 2014-10-22 DIAGNOSIS — M9905 Segmental and somatic dysfunction of pelvic region: Secondary | ICD-10-CM | POA: Diagnosis not present

## 2014-10-22 DIAGNOSIS — M9903 Segmental and somatic dysfunction of lumbar region: Secondary | ICD-10-CM | POA: Diagnosis not present

## 2014-10-22 DIAGNOSIS — M5136 Other intervertebral disc degeneration, lumbar region: Secondary | ICD-10-CM | POA: Diagnosis not present

## 2014-10-22 DIAGNOSIS — M5442 Lumbago with sciatica, left side: Secondary | ICD-10-CM | POA: Diagnosis not present

## 2014-10-22 DIAGNOSIS — M5416 Radiculopathy, lumbar region: Secondary | ICD-10-CM | POA: Diagnosis not present

## 2014-10-23 DIAGNOSIS — M5442 Lumbago with sciatica, left side: Secondary | ICD-10-CM | POA: Diagnosis not present

## 2014-10-23 DIAGNOSIS — M5416 Radiculopathy, lumbar region: Secondary | ICD-10-CM | POA: Diagnosis not present

## 2014-10-23 DIAGNOSIS — M791 Myalgia: Secondary | ICD-10-CM | POA: Diagnosis not present

## 2014-10-23 DIAGNOSIS — M9905 Segmental and somatic dysfunction of pelvic region: Secondary | ICD-10-CM | POA: Diagnosis not present

## 2014-10-23 DIAGNOSIS — M9903 Segmental and somatic dysfunction of lumbar region: Secondary | ICD-10-CM | POA: Diagnosis not present

## 2014-10-23 DIAGNOSIS — M5136 Other intervertebral disc degeneration, lumbar region: Secondary | ICD-10-CM | POA: Diagnosis not present

## 2014-10-27 ENCOUNTER — Other Ambulatory Visit: Payer: Self-pay

## 2014-10-29 DIAGNOSIS — M9903 Segmental and somatic dysfunction of lumbar region: Secondary | ICD-10-CM | POA: Diagnosis not present

## 2014-10-29 DIAGNOSIS — M791 Myalgia: Secondary | ICD-10-CM | POA: Diagnosis not present

## 2014-10-29 DIAGNOSIS — M5442 Lumbago with sciatica, left side: Secondary | ICD-10-CM | POA: Diagnosis not present

## 2014-10-29 DIAGNOSIS — M9905 Segmental and somatic dysfunction of pelvic region: Secondary | ICD-10-CM | POA: Diagnosis not present

## 2014-10-29 DIAGNOSIS — M5136 Other intervertebral disc degeneration, lumbar region: Secondary | ICD-10-CM | POA: Diagnosis not present

## 2014-10-29 DIAGNOSIS — M5416 Radiculopathy, lumbar region: Secondary | ICD-10-CM | POA: Diagnosis not present

## 2014-11-05 DIAGNOSIS — M9903 Segmental and somatic dysfunction of lumbar region: Secondary | ICD-10-CM | POA: Diagnosis not present

## 2014-11-05 DIAGNOSIS — M5442 Lumbago with sciatica, left side: Secondary | ICD-10-CM | POA: Diagnosis not present

## 2014-11-05 DIAGNOSIS — M5416 Radiculopathy, lumbar region: Secondary | ICD-10-CM | POA: Diagnosis not present

## 2014-11-05 DIAGNOSIS — M9905 Segmental and somatic dysfunction of pelvic region: Secondary | ICD-10-CM | POA: Diagnosis not present

## 2014-11-05 DIAGNOSIS — M5136 Other intervertebral disc degeneration, lumbar region: Secondary | ICD-10-CM | POA: Diagnosis not present

## 2014-11-05 DIAGNOSIS — M791 Myalgia: Secondary | ICD-10-CM | POA: Diagnosis not present

## 2014-11-13 ENCOUNTER — Encounter: Payer: Self-pay | Admitting: Family Medicine

## 2014-11-27 DIAGNOSIS — M791 Myalgia: Secondary | ICD-10-CM | POA: Diagnosis not present

## 2014-11-27 DIAGNOSIS — M9903 Segmental and somatic dysfunction of lumbar region: Secondary | ICD-10-CM | POA: Diagnosis not present

## 2014-11-27 DIAGNOSIS — M5442 Lumbago with sciatica, left side: Secondary | ICD-10-CM | POA: Diagnosis not present

## 2014-11-27 DIAGNOSIS — M9905 Segmental and somatic dysfunction of pelvic region: Secondary | ICD-10-CM | POA: Diagnosis not present

## 2014-11-27 DIAGNOSIS — M5416 Radiculopathy, lumbar region: Secondary | ICD-10-CM | POA: Diagnosis not present

## 2014-11-27 DIAGNOSIS — M5136 Other intervertebral disc degeneration, lumbar region: Secondary | ICD-10-CM | POA: Diagnosis not present

## 2014-12-15 ENCOUNTER — Ambulatory Visit (INDEPENDENT_AMBULATORY_CARE_PROVIDER_SITE_OTHER): Payer: Medicare Other | Admitting: Family Medicine

## 2014-12-15 ENCOUNTER — Encounter: Payer: Self-pay | Admitting: Family Medicine

## 2014-12-15 VITALS — BP 144/68 | HR 68 | Temp 98.1°F | Resp 14 | Ht 61.25 in | Wt 273.0 lb

## 2014-12-15 DIAGNOSIS — E785 Hyperlipidemia, unspecified: Secondary | ICD-10-CM | POA: Diagnosis not present

## 2014-12-15 DIAGNOSIS — E119 Type 2 diabetes mellitus without complications: Secondary | ICD-10-CM

## 2014-12-15 DIAGNOSIS — E668 Other obesity: Secondary | ICD-10-CM

## 2014-12-15 DIAGNOSIS — I10 Essential (primary) hypertension: Secondary | ICD-10-CM | POA: Diagnosis not present

## 2014-12-15 LAB — POCT GLYCOSYLATED HEMOGLOBIN (HGB A1C): Hemoglobin A1C: 6.2

## 2014-12-15 NOTE — Progress Notes (Signed)
Patient ID: Carol Ramsey, female   DOB: 1946/02/02, 69 y.o.   MRN: [id number]    Subjective:  HPI   Diabetes Mellitus Type II, Follow-up:   Lab Results  Component Value Date   HGBA1C 6.6* 03/17/2014   Last seen for diabetes 5 months ago.  Management changes included none. She reports good compliance with treatment. She is not having side effects.  Current symptoms include none  Home blood sugar records: fasting range: 60-80  Episodes of hypoglycemia? yes - one time when it got in 36s she did not feel so good.   Current Insulin Regimen: none Most Recent Eye Exam: 07/2014 Weight trend: stable Prior visit with dietician: no Current diet: not really follwoing a certain diet Current exercise: none     Hypertension, follow-up:  BP Readings from Last 3 Encounters:  12/15/14 144/68  07/08/14 120/60  04/23/08 126/79    She was last seen for hypertension 5 months ago.  BP at that visit was 120/60. Management changes since that visit include none .She reports good compliance with treatment. She is not having side effects.  She is not exercising. She is adherent to low salt diet.   Outside blood pressures are not been checked. She is experiencing dyspnea. When she is out in the heat and then goes inside.      Lipid/Cholesterol, Follow-up:   Last seen for this 5 months ago.  Management changes since that visit include none.Last labs were in November 2015  Last Lipid Panel:    Component Value Date/Time   CHOL 121 03/17/2014   TRIG 65 03/17/2014   HDL 61 03/17/2014   LDLCALC 47 03/17/2014    She reports good compliance with treatment. She is not having side effects.   Wt Readings from Last 3 Encounters:  12/15/14 273 lb (123.832 kg)  07/08/14 268 lb (121.564 kg)  01/21/08 284 lb (128.822 kg)    ------------------------------------------------------------------------    Prior to Admission medications   Medication Sig Start Date End Date Taking?  Authorizing Provider  ALPRAZolam Duanne Moron) 0.25 MG tablet Take by mouth. 07/23/12   Historical Provider, MD  amLODipine (NORVASC) 5 MG tablet Take by mouth. 05/28/14   Historical Provider, MD  aspirin 81 MG tablet Take by mouth. 08/30/11   Historical Provider, MD  atorvastatin (LIPITOR) 10 MG tablet Take by mouth.    Historical Provider, MD  cholecalciferol (VITAMIN D) 1000 UNITS tablet Take by mouth.    Historical Provider, MD  diclofenac sodium (VOLTAREN) 1 % GEL Apply topically. To affected area three times a day to qid as needed for pain-dispense 1 large container     Historical Provider, MD  glipiZIDE (GLUCOTROL XL) 10 MG 24 hr tablet Take by mouth. 08/26/14   Historical Provider, MD  glucose blood test strip BAYER CONTOUR TEST (In Vitro Strip)  1 (one) Strip Strip two times daily and as needed for 0 days  Quantity: 100;  Refills: 12   Ordered :05-Sep-2013  [name] ;  Started 05-Sep-2013 Active Comments: Medication taken as needed. diagnosis code 250.00 09/05/13   Historical Provider, MD  lisinopril-hydrochlorothiazide (PRINZIDE,ZESTORETIC) 20-12.5 MG per tablet Take by mouth. 08/26/14   Historical Provider, MD  loratadine (CLARITIN) 10 MG tablet Take by mouth. 01/31/12   Historical Provider, MD  meloxicam (MOBIC) 7.5 MG tablet Take by mouth. 03/18/13   Historical Provider, MD  nitroGLYCERIN (NITRO-DUR) 0.2 mg/hr Use 1/4 patch to each heel daily     Historical Provider, MD  pioglitazone (ACTOS) 45  MG tablet Take 1 tablet (45 mg total) by mouth daily. 10/21/14   Richard Maceo Pro., MD  sitaGLIPtin-metformin ([name]) 50-1000 MG per tablet Take 1 tablet by mouth 2 (two) times daily with a meal. 10/21/14   Jerrol Banana., MD    Patient Active Problem List   Diagnosis Date Noted  . Acquired hypothyroidism 09/17/2014  . Adaptation reaction 09/17/2014  . Absolute anemia 09/17/2014  . Cardiac dysrhythmia 09/17/2014  . Arthropathia 09/17/2014  . 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  09/17/2014  . Diabetes mellitus, type 2 09/17/2014  . Essential (primary) hypertension 09/17/2014  . HLD (hyperlipidemia) 09/17/2014  . Extreme obesity 09/17/2014  . Vitamin D deficiency 09/17/2014  . Beat, premature ventricular 12/11/2013  . OBESITY 11/14/2007  . ACHILLES BURSITIS OR TENDINITIS 11/14/2007  . BURSITIS, CALCANEAL 06/26/2007    No past medical history on file.  Social History   Social History  . Marital Status: Married    Spouse Name: N/A  . Number of Children: N/A  . Years of Education: N/A   Occupational History  . Not on file.   Social History Main Topics  . Smoking status: Never Smoker   . Smokeless tobacco: Never Used  . Alcohol Use: No  . Drug Use: No  . Sexual Activity: No   Other Topics Concern  . Not on file   Social History Narrative    Allergies  Allergen Reactions  . Amoxicillin   . Gabapentin Swelling  . Quinine Nausea Only and Nausea And Vomiting  . Dilaudid  [Hydromorphone Hcl] Rash  . Etodolac Rash  . Hydrochlorothiazide Rash  . Hydrocodone Rash  . Sulfa Antibiotics Rash    Review of Systems  Constitutional: Negative for fever, chills, weight loss and malaise/fatigue.  Eyes: Negative.   Respiratory: Positive for shortness of breath. Negative for cough, sputum production and wheezing.   Cardiovascular: Positive for leg swelling. Negative for chest pain, palpitations and orthopnea.  Gastrointestinal: Negative for heartburn, nausea, vomiting and abdominal pain.  Genitourinary: Negative.   Musculoskeletal: Positive for back pain and joint pain (knees stiffness).  Neurological: Negative for dizziness, tingling, tremors, weakness and headaches.  Endo/Heme/Allergies: Negative.   Psychiatric/Behavioral: Negative.     Immunization History  Administered Date(s) Administered  . Pneumococcal Polysaccharide-23 02/17/2007, 02/27/2012  . Td 10/21/2003  . Zoster 11/08/2010   Objective:  BP 144/68 mmHg  Pulse 68  Temp(Src) 98.1 F  (36.7 C)  Resp 14  Ht 5' 1.25" (1.556 m)  Wt 273 lb (123.832 kg)  BMI 51.15 kg/m2  Physical Exam  Constitutional: She is oriented to person, place, and time and well-developed, well-nourished, and in no distress.  Cooperative, obese white female in no acute distress  HENT:  Head: Normocephalic and atraumatic.  Right Ear: External ear normal.  Left Ear: External ear normal.  Nose: Nose normal.  Eyes: Conjunctivae are normal.  Neck: Neck supple.  Cardiovascular: Normal rate, regular rhythm and normal heart sounds.   Pulmonary/Chest: Effort normal and breath sounds normal.  Neurological: She is alert and oriented to person, place, and time. Gait normal.  Skin: Skin is warm and dry.  Psychiatric: Mood, memory, affect and judgment normal.    Lab Results  Component Value Date   WBC 10.8 03/17/2014   HGB 10.8* 03/17/2014   HCT 34* 03/17/2014   PLT 276 03/17/2014   GLUCOSE 153* 07/28/2011   CHOL 121 03/17/2014   TRIG 65 03/17/2014   HDL 61 03/17/2014  LDLCALC 47 03/17/2014   TSH 2.41 03/17/2014   INR 1.0 07/11/2011   HGBA1C 6.6* 03/17/2014    CMP     Component Value Date/Time   NA 142 03/17/2014   NA 140 07/28/2011 0144   K 4.9 03/17/2014   K 4.2 07/28/2011 0144   CL 106 07/28/2011 0144   CO2 23 07/28/2011 0144   GLUCOSE 153* 07/28/2011 0144   BUN 28* 03/17/2014   BUN 33* 07/28/2011 0144   CREATININE 1.1 03/17/2014   CREATININE 1.17 08/31/2011 1039   CALCIUM 8.5 07/28/2011 0144   AST 16 03/17/2014   ALT 7 03/17/2014   ALKPHOS 63 03/17/2014   GFRNONAA 49* 08/31/2011 1039   GFRNONAA 45* 07/28/2011 0144   GFRAA 57* 08/31/2011 1039   GFRAA 54* 07/28/2011 0144    Assessment and Plan :  Type 2 diabetes A1c today is 6.2%, this is improved from 6.6% this spring. Morbid obesity Diet and exercise discussed at some length. I think if the patient can lose 40 pounds we can get her off some of the medications she is on. It will take a significant effort on her part to  do so. Hypertension Again, regular exercise would help this issue. She will bring in blood pressures that are checked when she is at home or at the pharmacy. Readings for the past year been 120s over 89s.  [name] MD [facility] 12/15/2014 9:44 AM

## 2014-12-16 LAB — LIPID PANEL WITH LDL/HDL RATIO
CHOLESTEROL TOTAL: 111 mg/dL (ref 100–199)
HDL: 56 mg/dL (ref >39)
LDL CALC: 37 mg/dL (ref 0–99)
LDl/HDL Ratio: 0.7 ratio units (ref 0.0–3.2)
TRIGLYCERIDES: 92 mg/dL (ref 0–149)
VLDL CHOLESTEROL CAL: 18 mg/dL (ref 5–40)

## 2014-12-16 LAB — COMPREHENSIVE METABOLIC PANEL
ALBUMIN: 3.9 g/dL (ref 3.6–4.8)
ALT: 8 IU/L (ref 0–32)
AST: 18 IU/L (ref 0–40)
Albumin/Globulin Ratio: 1.5 (ref 1.1–2.5)
Alkaline Phosphatase: 55 IU/L (ref 39–117)
BILIRUBIN TOTAL: 0.4 mg/dL (ref 0.0–1.2)
BUN / CREAT RATIO: 29 — AB (ref 11–26)
BUN: 28 mg/dL — ABNORMAL HIGH (ref 8–27)
CHLORIDE: 105 mmol/L (ref 97–108)
CO2: 21 mmol/L (ref 18–29)
Calcium: 9.7 mg/dL (ref 8.7–10.3)
Creatinine, Ser: 0.98 mg/dL (ref 0.57–1.00)
GFR calc Af Amer: 69 mL/min/{1.73_m2} (ref >59)
GFR calc non Af Amer: 59 mL/min/{1.73_m2} — ABNORMAL LOW (ref >59)
GLOBULIN, TOTAL: 2.6 g/dL (ref 1.5–4.5)
Glucose: 102 mg/dL — ABNORMAL HIGH (ref 65–99)
Potassium: 4.7 mmol/L (ref 3.5–5.2)
SODIUM: 141 mmol/L (ref 134–144)
Total Protein: 6.5 g/dL (ref 6.0–8.5)

## 2014-12-16 LAB — CBC WITH DIFFERENTIAL/PLATELET
BASOS: 1 %
Basophils Absolute: 0 10*3/uL (ref 0.0–0.2)
EOS (ABSOLUTE): 0.2 10*3/uL (ref 0.0–0.4)
EOS: 3 %
HEMOGLOBIN: 10.4 g/dL — AB (ref 11.1–15.9)
Hematocrit: 32.9 % — ABNORMAL LOW (ref 34.0–46.6)
IMMATURE GRANS (ABS): 0 10*3/uL (ref 0.0–0.1)
IMMATURE GRANULOCYTES: 0 %
LYMPHS ABS: 1.5 10*3/uL (ref 0.7–3.1)
Lymphs: 22 %
MCH: 26.8 pg (ref 26.6–33.0)
MCHC: 31.6 g/dL (ref 31.5–35.7)
MCV: 85 fL (ref 79–97)
MONOS ABS: 0.6 10*3/uL (ref 0.1–0.9)
Monocytes: 10 %
Neutrophils Absolute: 4.2 10*3/uL (ref 1.4–7.0)
Neutrophils: 64 %
Platelets: 259 10*3/uL (ref 150–379)
RBC: 3.88 x10E6/uL (ref 3.77–5.28)
RDW: 15.2 % (ref 12.3–15.4)
WBC: 6.5 10*3/uL (ref 3.4–10.8)

## 2014-12-16 LAB — TSH: TSH: 2.21 u[IU]/mL (ref 0.450–4.500)

## 2014-12-18 ENCOUNTER — Other Ambulatory Visit: Payer: Self-pay | Admitting: Family Medicine

## 2014-12-19 NOTE — Telephone Encounter (Signed)
Should she continue with this at her age? Carol Ramsey

## 2015-01-08 DIAGNOSIS — Z23 Encounter for immunization: Secondary | ICD-10-CM | POA: Diagnosis not present

## 2015-01-14 DIAGNOSIS — I493 Ventricular premature depolarization: Secondary | ICD-10-CM | POA: Diagnosis not present

## 2015-01-14 DIAGNOSIS — I10 Essential (primary) hypertension: Secondary | ICD-10-CM | POA: Diagnosis not present

## 2015-01-14 DIAGNOSIS — E782 Mixed hyperlipidemia: Secondary | ICD-10-CM | POA: Diagnosis not present

## 2015-02-10 ENCOUNTER — Ambulatory Visit (INDEPENDENT_AMBULATORY_CARE_PROVIDER_SITE_OTHER): Payer: Medicare Other | Admitting: Family Medicine

## 2015-02-10 ENCOUNTER — Encounter: Payer: Self-pay | Admitting: Family Medicine

## 2015-02-10 VITALS — BP 142/72 | HR 62 | Temp 97.7°F | Resp 16 | Ht 61.0 in | Wt 273.0 lb

## 2015-02-10 DIAGNOSIS — Z1211 Encounter for screening for malignant neoplasm of colon: Secondary | ICD-10-CM

## 2015-02-10 DIAGNOSIS — Z1239 Encounter for other screening for malignant neoplasm of breast: Secondary | ICD-10-CM | POA: Diagnosis not present

## 2015-02-10 DIAGNOSIS — Z0000 Encounter for general adult medical examination without abnormal findings: Secondary | ICD-10-CM | POA: Diagnosis not present

## 2015-02-10 DIAGNOSIS — Z124 Encounter for screening for malignant neoplasm of cervix: Secondary | ICD-10-CM | POA: Diagnosis not present

## 2015-02-10 DIAGNOSIS — I272 Pulmonary hypertension, unspecified: Secondary | ICD-10-CM | POA: Insufficient documentation

## 2015-02-10 NOTE — Progress Notes (Signed)
Patient ID: Deriana L Scholze, female   DOB: 10/08/1945, 69 y.o.   MRN: [id number] Patient: Marasia L Buonomo, Female    DOB: [birth year], 69 y.o.   MRN: [id number] Visit Date: 02/10/2015  Today's Provider: [name], MD   Chief Complaint  Patient presents with  . Annual Exam   Subjective:   Hermena L Bemiss is a 69 y.o. female who presents today for her Subsequent Annual Wellness Visit. She feels well. She reports she is not exercising. She reports she is sleeping well.  Colonoscopy- 01/19/12 normal repeat 10 years Mammo 12/06/11  BMD- 11/28/11 Same osteopenia Pap- 04/23/08 normal Pneumovax23- has had 3 last 02/27/12 Td- 10/21/03 Zoster- 11/08/10  Pt already had flu vaccine she reports 2-3 weeks ago. Needs Prevnar.    Review of Systems  Constitutional: Negative.   HENT: Negative.   Eyes: Negative.   Respiratory: Negative.   Cardiovascular: Positive for leg swelling.  Gastrointestinal: Negative.   Endocrine: Positive for cold intolerance.  Genitourinary: Negative.   Musculoskeletal: Positive for back pain and arthralgias.  Skin: Negative.   Allergic/Immunologic: Negative.   Neurological: Negative.   Hematological: Negative.   Psychiatric/Behavioral: Negative.     Patient Active Problem List   Diagnosis Date Noted  . Pulmonary hypertension ([place]) 02/10/2015  . Acquired hypothyroidism 09/17/2014  . Adaptation reaction 09/17/2014  . Absolute anemia 09/17/2014  . Cardiac dysrhythmia 09/17/2014  . Arthropathia 09/17/2014  . 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 Diabetes mellitus, type 2 ([place]) 09/17/2014  . Essential (primary) hypertension 09/17/2014  . HLD (hyperlipidemia) 09/17/2014  . Extreme obesity ([place]) 09/17/2014  . Vitamin D deficiency 09/17/2014  . Beat, premature ventricular 12/11/2013  . OBESITY 11/14/2007  . ACHILLES BURSITIS OR TENDINITIS 11/14/2007  . BURSITIS, [place]CANEAL 06/26/2007    Social History   Social History  . Marital Status:  Married    Spouse Name: N/A  . Number of Children: N/A  . Years of Education: N/A   Occupational History  . Not on file.   Social History Main Topics  . Smoking status: Never Smoker   . Smokeless tobacco: Never Used  . Alcohol Use: No  . Drug Use: No  . Sexual Activity: No   Other Topics Concern  . Not on file   Social History Narrative    Past Surgical History  Procedure Laterality Date  . Knee surgery Left     arthroscopy  . Skin cancer excision      face    Her family history includes Brain cancer in her sister; Cervical cancer in her mother; Diabetes in her brother; Heart disease in her brother and father; Hypertension in her brother, father, and mother; Lung cancer in her father and sister; Stroke in her brother.    Outpatient Prescriptions Prior to Visit  Medication Sig Dispense Refill  . amLODipine (NORVASC) 5 MG tablet Take by mouth.    [name] aspirin 81 MG tablet Take by mouth.    [name] atorvastatin (LIPITOR) 10 MG tablet Take by mouth.    . cholecalciferol (VITAMIN D) 1000 UNITS tablet Take by mouth.    [name] glipiZIDE (GLUCOTROL XL) 10 MG 24 hr tablet Take by mouth.    [name] glucose blood test strip BAYER CONTOUR TEST (In Vitro Strip)  1 (one) Strip Strip two times daily and as needed for 0 days  Quantity: 100;  Refills: 12   Ordered :05-Sep-2013  [name] ;  Started 05-Sep-2013 Active Comments: Medication taken as needed.  diagnosis code 250.00    . lisinopril-hydrochlorothiazide (PRINZIDE,ZESTORETIC) 20-12.5 MG per tablet Take by mouth.    . meloxicam (MOBIC) 7.5 MG tablet TAKE 1 TABLET BY MOUTH TWICE DAILY AS NEEDED 60 tablet 0  . nitroGLYCERIN (NITRO-DUR) 0.2 mg/hr Use 1/4 patch to each heel daily     . sitaGLIPtin-metformin ([name]) 50-1000 MG per tablet Take 1 tablet by mouth 2 (two) times daily with a meal. 30 tablet 12  . pioglitazone (ACTOS) 45 MG tablet Take 1 tablet (45 mg total) by mouth daily. 30 tablet 12   No facility-administered medications prior to visit.     Allergies  Allergen Reactions  . Amoxicillin   . Gabapentin Swelling  . Quinine Nausea Only and Nausea And Vomiting  . Dilaudid  [Hydromorphone Hcl] Rash  . Etodolac Rash  . Hydrochlorothiazide Rash  . Hydrocodone Rash  . Sulfa Antibiotics Rash    Patient Care Team: Jerrol Banana., MD as PCP - General (Family Medicine)  Objective:   Vitals:  Filed Vitals:   02/10/15 0936  BP: 142/72  Pulse: 62  Temp: 97.7 F (36.5 C)  TempSrc: Oral  Resp: 16  Height: 5\' 1"  (1.549 m)  Weight: 273 lb (123.832 kg)    Physical Exam  Constitutional: She is oriented to person, place, and time. She appears well-developed and well-nourished.  Morbidly obese white female in no acute distress  HENT:  Head: Normocephalic and atraumatic.  Right Ear: External ear normal.  Left Ear: External ear normal.  Nose: Nose normal.  Mouth/Throat: Oropharynx is clear and moist.  Eyes: Conjunctivae and EOM are normal. Pupils are equal, round, and reactive to light.  Neck: Neck supple.  Cardiovascular: Normal rate, regular rhythm, normal heart sounds and intact distal pulses.   Pulmonary/Chest: Effort normal and breath sounds normal.  Musculoskeletal:  Patient has great difficulty moving/ambulating due to her obesity and arthritis  Neurological: She is alert and oriented to person, place, and time.  Skin: Skin is warm and dry.  Fair skin, thinning hair on the top of her head.  Psychiatric: She has a normal mood and affect. Her behavior is normal. Judgment and thought content normal.    Activities of Daily Living In your present state of health, do you have any difficulty performing the following activities: 02/10/2015 02/10/2015  Hearing? N N  Vision? N N  Difficulty concentrating or making decisions? N N  Walking or climbing stairs? Y N  Dressing or bathing? N N  Doing errands, shopping? N N    Fall Risk Assessment Fall Risk  02/10/2015 02/10/2015 12/15/2014  Falls in the past year?  No No No     Depression Screen PHQ 2/9 Scores 02/10/2015 02/10/2015 12/15/2014  PHQ - 2 Score 0 0 0    Cognitive Testing - 6-CIT    Year: 0 points  Month: 0 points  Memorize "Pia Mau, [address] [place]"  Time (within 1 hour:) 0 points  Count backwards from 20: 0 points  Name months of year: 0 points  Repeat Address: 4  points   Total Score: 4/28  Interpretation : Normal (0-7) Abnormal (8-28)    Assessment & Plan:     Annual Wellness Visit  Reviewed patient's Family Medical History Reviewed and updated list of patient's medical providers Assessment of cognitive impairment was done Assessed patient's functional ability Established a written schedule for health screening [place] Completed and Reviewed  Exercise Activities and Dietary recommendations Goals    None  Immunization History  Administered Date(s) Administered  . Pneumococcal Polysaccharide-23 02/17/2007, 02/27/2012  . Td 10/21/2003  . Zoster 11/08/2010    Health Maintenance  Topic Date Due  . Hepatitis C Screening  December 12, 1945  . FOOT EXAM  11/30/1956  . OPHTHALMOLOGY EXAM  11/30/1956  . URINE MICROALBUMIN  11/30/1956  . PNA vac Low Risk Adult (2 of 2 - PCV13) 02/26/2013  . TETANUS/TDAP  10/20/2013  . MAMMOGRAM  12/05/2013  . INFLUENZA VACCINE  02/10/2016 (Originally 12/01/2014)  . HEMOGLOBIN A1C  06/17/2015  . COLONOSCOPY  01/18/2022  . DEXA SCAN  Completed  . ZOSTAVAX  Completed     Tdap recommended. Discussed health benefits of physical activity, and encouraged her to engage in regular exercise appropriate for her age and condition.    [name] MD [facility] 02/10/2015 9:41 AM  ------------------------------------------------------------------------------------------------------------

## 2015-02-13 LAB — PAP IG (IMAGE GUIDED): PAP SMEAR COMMENT: 0

## 2015-02-16 ENCOUNTER — Other Ambulatory Visit: Payer: Self-pay | Admitting: Family Medicine

## 2015-02-17 ENCOUNTER — Other Ambulatory Visit: Payer: Self-pay | Admitting: Family Medicine

## 2015-02-17 ENCOUNTER — Telehealth: Payer: Self-pay

## 2015-02-17 DIAGNOSIS — Z1231 Encounter for screening mammogram for malignant neoplasm of breast: Secondary | ICD-10-CM

## 2015-02-17 NOTE — Telephone Encounter (Signed)
Tried calling, no answer-aa

## 2015-02-17 NOTE — Telephone Encounter (Signed)
Left message to call back  

## 2015-02-17 NOTE — Telephone Encounter (Signed)
Pt advised-aa 

## 2015-02-17 NOTE — Telephone Encounter (Signed)
Pt is returning call.  CB#[phone]/MW

## 2015-02-17 NOTE — Telephone Encounter (Signed)
-----   Message from Jerrol Banana., MD sent at 02/16/2015  1:56 PM EDT ----- Normal Pap

## 2015-02-24 DIAGNOSIS — M25561 Pain in right knee: Secondary | ICD-10-CM | POA: Diagnosis not present

## 2015-02-24 DIAGNOSIS — M1711 Unilateral primary osteoarthritis, right knee: Secondary | ICD-10-CM | POA: Diagnosis not present

## 2015-02-25 ENCOUNTER — Ambulatory Visit
Admission: RE | Admit: 2015-02-25 | Discharge: 2015-02-25 | Disposition: A | Discharge status: Home / Self Care | Payer: Medicare Other | Source: Ambulatory Visit | Attending: Family Medicine | Admitting: Family Medicine

## 2015-02-25 ENCOUNTER — Other Ambulatory Visit: Payer: Self-pay | Admitting: Family Medicine

## 2015-02-25 DIAGNOSIS — Z1231 Encounter for screening mammogram for malignant neoplasm of breast: Secondary | ICD-10-CM

## 2015-02-25 HISTORY — DX: Malignant (primary) neoplasm, unspecified: C80.1

## 2015-03-02 ENCOUNTER — Encounter: Payer: Self-pay | Admitting: Family Medicine

## 2015-03-02 NOTE — Telephone Encounter (Signed)
Pt advised through my chart-aa

## 2015-03-02 NOTE — Telephone Encounter (Signed)
With Tylenol yes, with ibuprofen no.

## 2015-03-02 NOTE — Telephone Encounter (Signed)
If I take tylenol or ibuprofen do I still take the low dose aspirin that day? Please review this was sent by my chart mail-aa

## 2015-03-17 ENCOUNTER — Other Ambulatory Visit: Payer: Self-pay | Admitting: Family Medicine

## 2015-04-13 ENCOUNTER — Other Ambulatory Visit: Payer: Self-pay | Admitting: Family Medicine

## 2015-04-13 ENCOUNTER — Encounter: Payer: Self-pay | Admitting: Family Medicine

## 2015-04-13 ENCOUNTER — Ambulatory Visit (INDEPENDENT_AMBULATORY_CARE_PROVIDER_SITE_OTHER): Payer: Medicare Other | Admitting: Family Medicine

## 2015-04-13 VITALS — BP 128/62 | HR 76 | Temp 98.0°F | Resp 18 | Wt 269.0 lb

## 2015-04-13 DIAGNOSIS — Z23 Encounter for immunization: Secondary | ICD-10-CM

## 2015-04-13 DIAGNOSIS — D559 Anemia due to enzyme disorder, unspecified: Secondary | ICD-10-CM | POA: Diagnosis not present

## 2015-04-13 DIAGNOSIS — I10 Essential (primary) hypertension: Secondary | ICD-10-CM

## 2015-04-13 DIAGNOSIS — E785 Hyperlipidemia, unspecified: Secondary | ICD-10-CM | POA: Diagnosis not present

## 2015-04-13 DIAGNOSIS — E119 Type 2 diabetes mellitus without complications: Secondary | ICD-10-CM | POA: Diagnosis not present

## 2015-04-13 LAB — POCT GLYCOSYLATED HEMOGLOBIN (HGB A1C): Hemoglobin A1C: 6.1

## 2015-04-13 MED ORDER — GLIPIZIDE ER 5 MG PO TB24: 10.0000 mg | ORAL_TABLET | Freq: Every day | ORAL | Status: DC

## 2015-04-13 NOTE — Progress Notes (Signed)
Patient ID: Carol Ramsey, female   DOB: 02/23/1946, 69 y.o.   MRN: [id number]    Subjective:  HPI  Diabetes follow up: Patient is here for 4 months follow up. Her lat OV was in October but for wellness visit. She does check her sugar and it has been running around 91, she had 1 low reading at 57 and she woke up sweaty that morning. She is taking Glipizide, [name], and Actos. Lab Results  Component Value Date   HGBA1C 6.2 12/15/2014    Hypertension follow up: Patient does not check her B/P at home. She is not having cardiac issues. BP Readings from Last 3 Encounters:  04/13/15 128/62  02/10/15 142/72  12/15/14 144/68    Prior to Admission medications   Medication Sig Start Date End Date Taking? Authorizing Provider  amLODipine (NORVASC) 5 MG tablet Take by mouth. 05/28/14  Yes Historical Provider, MD  aspirin 81 MG tablet Take by mouth. 08/30/11  Yes Historical Provider, MD  atorvastatin (LIPITOR) 10 MG tablet Take by mouth.   Yes Historical Provider, MD  cholecalciferol (VITAMIN D) 1000 UNITS tablet Take by mouth.   Yes Historical Provider, MD  glipiZIDE (GLUCOTROL XL) 10 MG 24 hr tablet TAKE 1 TABLET BY MOUTH DAILY. 02/16/15  Yes Shahzad Thomann Maceo Pro., MD  glucose blood test strip BAYER CONTOUR TEST (In Vitro Strip)  1 (one) Strip Strip two times daily and as needed for 0 days  Quantity: 100;  Refills: 12   Ordered :05-Sep-2013  [name] ;  Started 05-Sep-2013 Active Comments: Medication taken as needed. diagnosis code 250.00 09/05/13  Yes Historical Provider, MD  lisinopril-hydrochlorothiazide (PRINZIDE,ZESTORETIC) 20-12.5 MG tablet TAKE 2 TABLETS BY MOUTH EVERY MORNING 03/17/15  Yes Jerrol Banana., MD  meloxicam (MOBIC) 7.5 MG tablet TAKE 1 TABLET BY MOUTH TWICE DAILY AS NEEDED 12/19/14  Yes Jerrol Banana., MD  nitroGLYCERIN (NITRO-DUR) 0.2 mg/hr Use 1/4 patch to each heel daily    Yes Historical Provider, MD  pioglitazone (ACTOS) 45 MG tablet Take 1 tablet (45 mg  total) by mouth daily. 10/21/14  Yes Criss Pallone Maceo Pro., MD  sitaGLIPtin-metformin ([name]) 50-1000 MG per tablet Take 1 tablet by mouth 2 (two) times daily with a meal. 10/21/14  Yes Jerrol Banana., MD    Patient Active Problem List   Diagnosis Date Noted  . Pulmonary hypertension ([place]) 02/10/2015  . Acquired hypothyroidism 09/17/2014  . Adaptation reaction 09/17/2014  . Absolute anemia 09/17/2014  . Cardiac dysrhythmia 09/17/2014  . Arthropathia 09/17/2014  . 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 Diabetes mellitus, type 2 ([place]) 09/17/2014  . Essential (primary) hypertension 09/17/2014  . HLD (hyperlipidemia) 09/17/2014  . Extreme obesity ([place]) 09/17/2014  . Vitamin D deficiency 09/17/2014  . Beat, premature ventricular 12/11/2013  . OBESITY 11/14/2007  . ACHILLES BURSITIS OR TENDINITIS 11/14/2007  . BURSITIS, [place] 06/26/2007    Past Medical History  Diagnosis Date  . Cancer ([place])     basal cell on leg    Social History   Social History  . Marital Status: Married    Spouse Name: N/A  . Number of Children: N/A  . Years of Education: N/A   Occupational History  . Not on file.   Social History Main Topics  . Smoking status: Never Smoker   . Smokeless tobacco: Never Used  . Alcohol Use: No  . Drug Use: No  . Sexual Activity: No   Other  Topics Concern  . Not on file   Social History Narrative    Allergies  Allergen Reactions  . Amoxicillin   . Gabapentin Swelling  . Quinine Nausea Only and Nausea And Vomiting  . Dilaudid  [Hydromorphone Hcl] Rash  . Etodolac Rash  . Hydrochlorothiazide Rash  . Hydrocodone Rash  . Hydromorphone Rash  . Sulfa Antibiotics Rash    Review of Systems  Constitutional: Negative.   Eyes: Negative.   Respiratory: Negative.   Cardiovascular: Negative.   Gastrointestinal: Negative.   Musculoskeletal: Positive for back pain and joint pain.  Skin: Negative.   Neurological: Negative.     Psychiatric/Behavioral: Negative.     Immunization History  Administered Date(s) Administered  . Pneumococcal Polysaccharide-23 02/17/2007, 02/27/2012  . Td 10/21/2003  . Zoster 11/08/2010   Objective:  BP 128/62 mmHg  Pulse 76  Temp(Src) 98 F (36.7 C)  Resp 18  Wt 269 lb (122.018 kg)  Physical Exam  Constitutional: She is oriented to person, place, and time and well-developed, well-nourished, and in no distress.  Morbidly obese white female in no acute distress.  HENT:  Head: Normocephalic and atraumatic.  Eyes: Conjunctivae are normal. Pupils are equal, round, and reactive to light.  Neck: Normal range of motion. Neck supple.  Cardiovascular: Normal rate, regular rhythm, normal heart sounds and intact distal pulses.   No murmur heard. Pulmonary/Chest: Effort normal and breath sounds normal. No respiratory distress. She has no wheezes.  Musculoskeletal: She exhibits no edema or tenderness.  Neurological: She is alert and oriented to person, place, and time.  Skin: Skin is warm and dry.  Hair thinning on top of head.  Psychiatric: Mood, memory, affect and judgment normal.    Lab Results  Component Value Date   WBC 6.5 12/15/2014   HGB 10.8* 03/17/2014   HCT 32.9* 12/15/2014   PLT 276 03/17/2014   GLUCOSE 102* 12/15/2014   CHOL 111 12/15/2014   TRIG 92 12/15/2014   HDL 56 12/15/2014   LDLCALC 37 12/15/2014   TSH 2.210 12/15/2014   INR 1.0 07/11/2011   HGBA1C 6.2 12/15/2014    CMP     Component Value Date/Time   NA 141 12/15/2014 1106   NA 140 07/28/2011 0144   K 4.7 12/15/2014 1106   K 4.2 07/28/2011 0144   CL 105 12/15/2014 1106   CL 106 07/28/2011 0144   CO2 21 12/15/2014 1106   CO2 23 07/28/2011 0144   GLUCOSE 102* 12/15/2014 1106   GLUCOSE 153* 07/28/2011 0144   BUN 28* 12/15/2014 1106   BUN 33* 07/28/2011 0144   CREATININE 0.98 12/15/2014 1106   CREATININE 1.1 03/17/2014   CREATININE 1.17 08/31/2011 1039   CALCIUM 9.7 12/15/2014 1106    CALCIUM 8.5 07/28/2011 0144   PROT 6.5 12/15/2014 1106   ALBUMIN 3.9 12/15/2014 1106   AST 18 12/15/2014 1106   ALT 8 12/15/2014 1106   ALKPHOS 55 12/15/2014 1106   BILITOT 0.4 12/15/2014 1106   GFRNONAA 59* 12/15/2014 1106   GFRNONAA 49* 08/31/2011 1039   GFRNONAA 45* 07/28/2011 0144   GFRAA 69 12/15/2014 1106   GFRAA 57* 08/31/2011 1039   GFRAA 54* 07/28/2011 0144    Assessment and Plan :  1. Essential (primary) hypertension Stable. continue current medication.  2. Type 2 diabetes mellitus without complication, without long-term current use of insulin ([name]) A1C 6.1 today, better. Continue working on habits. She lost 4 lbs since October so far. May need to stop Glipizide in the  future if she starts having frequent hypoglycemic episodes, will go ahead just decrease Glipizide to 5 mg for now. FOllow. - POCT HgB A1C  3. Morbid obesity, unspecified obesity type ([place]) Encouraged continued weight loss with diet and exercise efforts. 4. HLD (hyperlipidemia)  5. 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 Re check labs today. May need further work up. Follow. Consider GI/hematology workup if persistent  6. Need for pneumococcal vaccination - Pneumococcal conjugate vaccine 13-valent   Patient was seen and examined by Dr. Eulas Post and note was scribed by Theressa Millard, RMA.   [name] MD [facility] 04/13/2015 8:47 AM

## 2015-04-14 ENCOUNTER — Telehealth: Payer: Self-pay

## 2015-04-14 LAB — CBC WITH DIFFERENTIAL/PLATELET
BASOS ABS: 0.1 10*3/uL (ref 0.0–0.2)
Basos: 1 %
EOS (ABSOLUTE): 0.1 10*3/uL (ref 0.0–0.4)
Eos: 2 %
Hematocrit: 34.1 % (ref 34.0–46.6)
Hemoglobin: 10.8 g/dL — ABNORMAL LOW (ref 11.1–15.9)
IMMATURE GRANS (ABS): 0 10*3/uL (ref 0.0–0.1)
Immature Granulocytes: 1 %
LYMPHS: 29 %
Lymphocytes Absolute: 1.7 10*3/uL (ref 0.7–3.1)
MCH: 26.7 pg (ref 26.6–33.0)
MCHC: 31.7 g/dL (ref 31.5–35.7)
MCV: 84 fL (ref 79–97)
MONOS ABS: 0.7 10*3/uL (ref 0.1–0.9)
Monocytes: 11 %
NEUTROS ABS: 3.2 10*3/uL (ref 1.4–7.0)
Neutrophils: 56 %
PLATELETS: 264 10*3/uL (ref 150–379)
RBC: 4.04 x10E6/uL (ref 3.77–5.28)
RDW: 14.8 % (ref 12.3–15.4)
WBC: 5.8 10*3/uL (ref 3.4–10.8)

## 2015-04-14 LAB — RENAL FUNCTION PANEL
Albumin: 4 g/dL (ref 3.6–4.8)
BUN / CREAT RATIO: 22 (ref 11–26)
BUN: 25 mg/dL (ref 8–27)
CO2: 20 mmol/L (ref 18–29)
CREATININE: 1.12 mg/dL — AB (ref 0.57–1.00)
Calcium: 9.7 mg/dL (ref 8.7–10.3)
Chloride: 102 mmol/L (ref 96–106)
GFR, EST AFRICAN AMERICAN: 58 mL/min/{1.73_m2} — AB (ref >59)
GFR, EST NON AFRICAN AMERICAN: 51 mL/min/{1.73_m2} — AB (ref >59)
GLUCOSE: 119 mg/dL — AB (ref 65–99)
POTASSIUM: 4.6 mmol/L (ref 3.5–5.2)
Phosphorus: 3.5 mg/dL (ref 2.5–4.5)
SODIUM: 143 mmol/L (ref 134–144)

## 2015-04-14 NOTE — Telephone Encounter (Signed)
LMTCB ED 

## 2015-04-14 NOTE — Telephone Encounter (Signed)
Advised patient

## 2015-04-14 NOTE — Telephone Encounter (Addendum)
Pt is returning call.  CB#[phone]/MW

## 2015-04-14 NOTE — Telephone Encounter (Signed)
-----   Message from Jerrol Banana., MD sent at 04/14/2015 10:29 AM EST ----- Labs stable

## 2015-04-20 DIAGNOSIS — Z85828 Personal history of other malignant neoplasm of skin: Secondary | ICD-10-CM | POA: Diagnosis not present

## 2015-04-20 DIAGNOSIS — L89899 Pressure ulcer of other site, unspecified stage: Secondary | ICD-10-CM | POA: Diagnosis not present

## 2015-04-20 DIAGNOSIS — D485 Neoplasm of uncertain behavior of skin: Secondary | ICD-10-CM | POA: Diagnosis not present

## 2015-04-20 DIAGNOSIS — D045 Carcinoma in situ of skin of trunk: Secondary | ICD-10-CM | POA: Diagnosis not present

## 2015-05-05 DIAGNOSIS — D045 Carcinoma in situ of skin of trunk: Secondary | ICD-10-CM | POA: Diagnosis not present

## 2015-05-21 ENCOUNTER — Other Ambulatory Visit: Payer: Self-pay

## 2015-05-21 MED ORDER — GLUCOSE BLOOD VI STRP: ORAL_STRIP | Status: DC

## 2015-05-22 ENCOUNTER — Other Ambulatory Visit: Payer: Self-pay | Admitting: Emergency Medicine

## 2015-05-22 ENCOUNTER — Telehealth: Payer: Self-pay | Admitting: Family Medicine

## 2015-05-22 DIAGNOSIS — E119 Type 2 diabetes mellitus without complications: Secondary | ICD-10-CM

## 2015-05-22 MED ORDER — GLUCOSE BLOOD VI STRP: ORAL_STRIP | Status: DC

## 2015-06-15 ENCOUNTER — Other Ambulatory Visit: Payer: Self-pay | Admitting: Family Medicine

## 2015-07-07 DIAGNOSIS — Z96652 Presence of left artificial knee joint: Secondary | ICD-10-CM | POA: Diagnosis not present

## 2015-07-07 DIAGNOSIS — M1711 Unilateral primary osteoarthritis, right knee: Secondary | ICD-10-CM | POA: Diagnosis not present

## 2015-08-24 ENCOUNTER — Ambulatory Visit: Payer: Medicare Other | Admitting: Family Medicine

## 2015-09-01 DIAGNOSIS — E113293 Type 2 diabetes mellitus with mild nonproliferative diabetic retinopathy without macular edema, bilateral: Secondary | ICD-10-CM | POA: Diagnosis not present

## 2015-09-01 DIAGNOSIS — H524 Presbyopia: Secondary | ICD-10-CM | POA: Diagnosis not present

## 2015-09-01 DIAGNOSIS — H2513 Age-related nuclear cataract, bilateral: Secondary | ICD-10-CM | POA: Diagnosis not present

## 2015-10-05 ENCOUNTER — Ambulatory Visit (INDEPENDENT_AMBULATORY_CARE_PROVIDER_SITE_OTHER): Payer: Medicare Other | Admitting: Family Medicine

## 2015-10-05 VITALS — BP 144/68 | HR 80 | Temp 97.5°F | Resp 16 | Wt 265.0 lb

## 2015-10-05 DIAGNOSIS — D559 Anemia due to enzyme disorder, unspecified: Secondary | ICD-10-CM | POA: Diagnosis not present

## 2015-10-05 DIAGNOSIS — I10 Essential (primary) hypertension: Secondary | ICD-10-CM

## 2015-10-05 DIAGNOSIS — E119 Type 2 diabetes mellitus without complications: Secondary | ICD-10-CM | POA: Diagnosis not present

## 2015-10-05 DIAGNOSIS — E785 Hyperlipidemia, unspecified: Secondary | ICD-10-CM | POA: Diagnosis not present

## 2015-10-05 NOTE — Progress Notes (Signed)
Patient ID: Carol Ramsey, female   DOB: Nov 26, 1945, 70 y.o.   MRN: [id number]   Carol Ramsey  MRN: [id number] DOB: [birth year]  Subjective:  HPI   1. Type 2 diabetes mellitus without complication, without long-term current use of insulin [name] The patient is a 70 year old female who presents today for follow up of her diabetes.  Her last visit was on 04/13/15.  Her A1C on that date was 6.1.  She does check her glucose at home and has been getting readings of 60's-130's.  When her glucose was 60, 3 days ago she did have symptoms of sweaty, shaky, weak and nervous feeling.  She did not have any nausea, tachycardia or palpations with  It.  On her last visit she was instructed to decrease the Glypizide to 5 mg daily which she has been doing.  It was discussed that she may need to come off of the Glypizide.  2. 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  On her last visit it was discussed that she may need to have a referral if this persisted.  Her last CBC was done on 04/13/15  Lab Results  Component Value Date   WBC 5.8 04/13/2015   HGB 10.8* 03/17/2014   HCT 34.1 04/13/2015   MCV 84 04/13/2015   PLT 264 04/13/2015    3. HLD (hyperlipidemia) The patient is due to have her cholesterol checked.  Her last one was done in August  2016.  Lab Results  Component Value Date   CHOL 111 12/15/2014   HDL 56 12/15/2014   LDLCALC 37 12/15/2014   TRIG 92 12/15/2014    4. Essential (primary) hypertension The patient is also followed for her hypertension.  On her last visit her blood pressure was 128/62.  She states that it is usually pretty good.  She has been stable until today but states she had a stressful weekend and did not sleep well last night.  She feels this may be contributing to her elevated pressure today.  The patient is due for Microalbumin, foot and eye exam, and she has never had a Hep C screening.    The patient is questioning if it would  be safe for her to take Turmeric for joint pain.  It does have a precaution for patient's with gall stones, she did have a test done about 3 years ago that reported seeing gall stones.   She would also like to know about taking Curamin for inflammation and joint pain.  It does not appear to have any precautions on the bottle.   Patient Active Problem List   Diagnosis Date Noted  . Pulmonary hypertension ([name]) 02/10/2015  . Acquired hypothyroidism 09/17/2014  . Adaptation reaction 09/17/2014  . Absolute anemia 09/17/2014  . Cardiac dysrhythmia 09/17/2014  . Arthropathia 09/17/2014  . 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 Diabetes mellitus, type 2 ([name]) 09/17/2014  . Essential (primary) hypertension 09/17/2014  . HLD (hyperlipidemia) 09/17/2014  . Extreme obesity ([name]) 09/17/2014  . Vitamin D deficiency 09/17/2014  . Beat, premature ventricular 12/11/2013  . OBESITY 11/14/2007  . ACHILLES BURSITIS OR TENDINITIS 11/14/2007  . BURSITIS, [place] 06/26/2007    Past Medical History  Diagnosis Date  . Cancer ([name])     basal cell on leg    Social History   Social History  . Marital Status: Married    Spouse Name: N/A  . Number  of Children: N/A  . Years of Education: N/A   Occupational History  . Not on file.   Social History Main Topics  . Smoking status: Never Smoker   . Smokeless tobacco: Never Used  . Alcohol Use: No  . Drug Use: No  . Sexual Activity: No   Other Topics Concern  . Not on file   Social History Narrative    Outpatient Prescriptions Prior to Visit  Medication Sig Dispense Refill  . amLODipine (NORVASC) 5 MG tablet TAKE 1 TABLET BY MOUTH EVERY DAY 90 tablet 3  . aspirin 81 MG tablet Take by mouth.    [name] atorvastatin (LIPITOR) 10 MG tablet Take by mouth.    . cholecalciferol (VITAMIN D) 1000 UNITS tablet Take by mouth.    [name] glipiZIDE (GLUCOTROL XL) 5 MG 24 hr tablet Take 2 tablets (10 mg total) by mouth daily. 90 tablet 1  .  glucose blood (ONETOUCH VERIO) test strip Use as instructed; Check blood sugar twice daily 100 each 12  . glucose blood test strip BAYER CONTOUR TEST (In Vitro Strip)  1 (one) Strip Strip two times daily and as needed for 0 days  Quantity: 100;  Refills: 12   Ordered :05-Sep-2013  [name] ;  Started 05-Sep-2013 Active Comments: Medication taken as needed. diagnosis code 250.00    . lisinopril-hydrochlorothiazide (PRINZIDE,ZESTORETIC) 20-12.5 MG tablet TAKE 2 TABLETS BY MOUTH EVERY MORNING 60 tablet 12  . meloxicam (MOBIC) 7.5 MG tablet TAKE 1 TABLET BY MOUTH TWICE DAILY AS NEEDED 60 tablet 0  . nitroGLYCERIN (NITRO-DUR) 0.2 mg/hr Use 1/4 patch to each heel daily     . pioglitazone (ACTOS) 45 MG tablet Take 1 tablet (45 mg total) by mouth daily. 30 tablet 12  . sitaGLIPtin-metformin ([name]) 50-1000 MG per tablet Take 1 tablet by mouth 2 (two) times daily with a meal. 30 tablet 12   No facility-administered medications prior to visit.    Allergies  Allergen Reactions  . Amoxicillin   . Gabapentin Swelling  . Quinine Nausea Only and Nausea And Vomiting  . Dilaudid  [Hydromorphone Hcl] Rash  . Etodolac Rash  . Hydrochlorothiazide Rash  . Hydrocodone Rash  . Hydromorphone Rash  . Sulfa Antibiotics Rash    Review of Systems  Constitutional: Negative for fever and malaise/fatigue.  Respiratory: Negative for cough, shortness of breath and wheezing.   Cardiovascular: Negative for chest pain, palpitations, orthopnea, claudication, leg swelling and PND.  Genitourinary: Negative for frequency.  Neurological: Negative for dizziness, weakness and headaches.  Endo/Heme/Allergies: Negative for polydipsia.   Objective:  BP 144/68 mmHg  Pulse 80  Temp(Src) 97.5 F (36.4 C) (Oral)  Resp 16  Wt 265 lb (120.203 kg)  Physical Exam  Constitutional: She is oriented to person, place, and time and well-developed, well-nourished, and in no distress.  HENT:  Head: Normocephalic and atraumatic.    Right Ear: External ear normal.  Left Ear: External ear normal.  Nose: Nose normal.  Eyes: Conjunctivae are normal.  Neck: Neck supple.  Cardiovascular: Normal rate, regular rhythm and normal heart sounds.   Pulmonary/Chest: Effort normal and breath sounds normal.  Abdominal: Soft.  Neurological: She is alert and oriented to person, place, and time.  Skin: Skin is warm and dry.  Psychiatric: Mood, memory, affect and judgment normal.    Diabetic Foot Exam - Simple   Simple Foot Form  Visual Inspection  No deformities, no ulcerations, no other skin breakdown bilaterally:  Yes  Sensation Testing  Intact  to touch and monofilament testing bilaterally:  Yes  Pulse Check  Posterior Tibialis and Dorsalis pulse intact bilaterally:  Yes  Comments       Assessment and Plan :   1. Type 2 diabetes mellitus without complication, without long-term current use of insulin ([name])  - Hemoglobin A1c  2. Anemia  ([name])  - CBC with Differential/Platelet  3. HLD (hyperlipidemia)  - Lipid Panel With LDL/HDL Ratio  4. Essential (primary) hypertension  5.Morbid Obesity  [name] MD [facility] 10/05/2015 9:42 AM

## 2015-10-06 LAB — CBC WITH DIFFERENTIAL/PLATELET
BASOS: 1 %
Basophils Absolute: 0.1 10*3/uL (ref 0.0–0.2)
EOS (ABSOLUTE): 0.3 10*3/uL (ref 0.0–0.4)
EOS: 4 %
HEMOGLOBIN: 10.8 g/dL — AB (ref 11.1–15.9)
Hematocrit: 33.6 % — ABNORMAL LOW (ref 34.0–46.6)
IMMATURE GRANS (ABS): 0 10*3/uL (ref 0.0–0.1)
IMMATURE GRANULOCYTES: 0 %
LYMPHS: 32 %
Lymphocytes Absolute: 2 10*3/uL (ref 0.7–3.1)
MCH: 26.7 pg (ref 26.6–33.0)
MCHC: 32.1 g/dL (ref 31.5–35.7)
MCV: 83 fL (ref 79–97)
MONOCYTES: 12 %
Monocytes Absolute: 0.7 10*3/uL (ref 0.1–0.9)
NEUTROS ABS: 3.2 10*3/uL (ref 1.4–7.0)
NEUTROS PCT: 51 %
PLATELETS: 266 10*3/uL (ref 150–379)
RBC: 4.05 x10E6/uL (ref 3.77–5.28)
RDW: 15.6 % — ABNORMAL HIGH (ref 12.3–15.4)
WBC: 6.2 10*3/uL (ref 3.4–10.8)

## 2015-10-06 LAB — HEMOGLOBIN A1C
ESTIMATED AVERAGE GLUCOSE: 146 mg/dL
Hgb A1c MFr Bld: 6.7 % — ABNORMAL HIGH (ref 4.8–5.6)

## 2015-10-06 LAB — LIPID PANEL WITH LDL/HDL RATIO
CHOLESTEROL TOTAL: 115 mg/dL (ref 100–199)
HDL: 55 mg/dL (ref >39)
LDL Calculated: 38 mg/dL (ref 0–99)
LDl/HDL Ratio: 0.7 ratio units (ref 0.0–3.2)
TRIGLYCERIDES: 109 mg/dL (ref 0–149)
VLDL CHOLESTEROL CAL: 22 mg/dL (ref 5–40)

## 2015-10-07 ENCOUNTER — Encounter: Payer: Self-pay | Admitting: Family Medicine

## 2015-10-08 NOTE — Progress Notes (Signed)
Advised  ED 

## 2015-10-20 DIAGNOSIS — D0472 Carcinoma in situ of skin of left lower limb, including hip: Secondary | ICD-10-CM | POA: Diagnosis not present

## 2015-10-20 DIAGNOSIS — D485 Neoplasm of uncertain behavior of skin: Secondary | ICD-10-CM | POA: Diagnosis not present

## 2015-10-20 DIAGNOSIS — D225 Melanocytic nevi of trunk: Secondary | ICD-10-CM | POA: Diagnosis not present

## 2015-10-20 DIAGNOSIS — D2261 Melanocytic nevi of right upper limb, including shoulder: Secondary | ICD-10-CM | POA: Diagnosis not present

## 2015-10-20 DIAGNOSIS — C44719 Basal cell carcinoma of skin of left lower limb, including hip: Secondary | ICD-10-CM | POA: Diagnosis not present

## 2015-10-20 DIAGNOSIS — D2272 Melanocytic nevi of left lower limb, including hip: Secondary | ICD-10-CM | POA: Diagnosis not present

## 2015-10-20 DIAGNOSIS — Z85828 Personal history of other malignant neoplasm of skin: Secondary | ICD-10-CM | POA: Diagnosis not present

## 2015-11-16 ENCOUNTER — Other Ambulatory Visit: Payer: Self-pay | Admitting: Family Medicine

## 2015-11-24 ENCOUNTER — Other Ambulatory Visit: Payer: Self-pay | Admitting: Family Medicine

## 2015-11-24 ENCOUNTER — Other Ambulatory Visit: Payer: Self-pay

## 2015-11-24 DIAGNOSIS — E119 Type 2 diabetes mellitus without complications: Secondary | ICD-10-CM

## 2015-12-09 ENCOUNTER — Encounter: Payer: Self-pay | Admitting: Family Medicine

## 2015-12-09 NOTE — Telephone Encounter (Signed)
error 

## 2015-12-14 DIAGNOSIS — C44719 Basal cell carcinoma of skin of left lower limb, including hip: Secondary | ICD-10-CM | POA: Diagnosis not present

## 2015-12-14 DIAGNOSIS — L905 Scar conditions and fibrosis of skin: Secondary | ICD-10-CM | POA: Diagnosis not present

## 2015-12-15 ENCOUNTER — Other Ambulatory Visit: Payer: Self-pay | Admitting: Family Medicine

## 2015-12-15 DIAGNOSIS — E119 Type 2 diabetes mellitus without complications: Secondary | ICD-10-CM

## 2015-12-15 DIAGNOSIS — E118 Type 2 diabetes mellitus with unspecified complications: Secondary | ICD-10-CM

## 2015-12-28 DIAGNOSIS — D0472 Carcinoma in situ of skin of left lower limb, including hip: Secondary | ICD-10-CM | POA: Diagnosis not present

## 2016-01-19 DIAGNOSIS — I10 Essential (primary) hypertension: Secondary | ICD-10-CM | POA: Diagnosis not present

## 2016-01-19 DIAGNOSIS — Z0000 Encounter for general adult medical examination without abnormal findings: Secondary | ICD-10-CM | POA: Diagnosis not present

## 2016-01-19 DIAGNOSIS — I119 Hypertensive heart disease without heart failure: Secondary | ICD-10-CM | POA: Insufficient documentation

## 2016-01-19 DIAGNOSIS — E119 Type 2 diabetes mellitus without complications: Secondary | ICD-10-CM | POA: Diagnosis not present

## 2016-02-15 ENCOUNTER — Ambulatory Visit: Payer: Medicare Other | Admitting: Family Medicine

## 2016-02-17 ENCOUNTER — Telehealth: Payer: Self-pay | Admitting: Family Medicine

## 2016-02-17 NOTE — Telephone Encounter (Signed)
Called Pt to schedule AWV with [facility] for 10:45 11/2 b4 OV - knb

## 2016-03-03 ENCOUNTER — Ambulatory Visit (INDEPENDENT_AMBULATORY_CARE_PROVIDER_SITE_OTHER): Payer: Medicare Other | Admitting: Family Medicine

## 2016-03-03 ENCOUNTER — Encounter: Payer: Self-pay | Admitting: Family Medicine

## 2016-03-03 VITALS — BP 134/62 | HR 74 | Temp 97.9°F | Resp 16 | Wt 259.0 lb

## 2016-03-03 DIAGNOSIS — I10 Essential (primary) hypertension: Secondary | ICD-10-CM

## 2016-03-03 DIAGNOSIS — E119 Type 2 diabetes mellitus without complications: Secondary | ICD-10-CM | POA: Diagnosis not present

## 2016-03-03 DIAGNOSIS — Z23 Encounter for immunization: Secondary | ICD-10-CM

## 2016-03-03 LAB — POCT GLYCOSYLATED HEMOGLOBIN (HGB A1C): Hemoglobin A1C: 6.4

## 2016-03-03 NOTE — Progress Notes (Signed)
Subjective:  HPI  Diabetes Mellitus Type II, Follow-up:   Lab Results  Component Value Date   HGBA1C 6.7 (H) 10/05/2015   HGBA1C 6.1 04/13/2015   HGBA1C 6.2 12/15/2014    Last seen for diabetes 4 months ago.  Management since then includes none. She reports good compliance with treatment. She is not having side effects.  Current symptoms include none and have been unchanged. Home blood sugar records: 90's-120 fasting and around 176 after meals   Episodes of hypoglycemia? yes - 1-2.   Current Insulin Regimen: n/a Most Recent Eye Exam: 08/2015 Current exercise: none  Pertinent Labs:    Component Value Date/Time   CHOL 115 10/05/2015 1059   TRIG 109 10/05/2015 1059   HDL 55 10/05/2015 1059   LDLCALC 38 10/05/2015 1059   CREATININE 1.12 (H) 04/13/2015 0932   CREATININE 1.17 08/31/2011 1039    Wt Readings from Last 3 Encounters:  03/03/16 259 lb (117.5 kg)  10/05/15 265 lb (120.2 kg)  04/13/15 269 lb (122 kg)    ------------------------------------------------------------------------  Pt would like forms filled out for diabetic shoes.  Prior to Admission medications   Medication Sig Start Date End Date Taking? Authorizing Provider  amLODipine (NORVASC) 5 MG tablet TAKE 1 TABLET BY MOUTH EVERY DAY 06/15/15   Jerrol Banana., MD  aspirin 81 MG tablet Take by mouth. 08/30/11   Historical Provider, MD  atorvastatin (LIPITOR) 10 MG tablet Take by mouth.    Historical Provider, MD  cholecalciferol (VITAMIN D) 1000 UNITS tablet Take by mouth.    Historical Provider, MD  glipiZIDE (GLUCOTROL XL) 5 MG 24 hr tablet TAKE 2 TABLETS(10 MG) BY MOUTH DAILY 11/24/15   Jerrol Banana., MD  glucose blood (ONETOUCH VERIO) test strip Use as instructed; Check blood sugar twice daily 05/22/15   Richard Maceo Pro., MD  glucose blood test strip BAYER CONTOUR TEST (In Vitro Strip)  1 (one) Strip Strip two times daily and as needed for 0 days  Quantity: 100;  Refills: 12    Ordered :05-Sep-2013  [name] ;  Started 05-Sep-2013 Active Comments: Medication taken as needed. diagnosis code 250.00 09/05/13   Historical Provider, MD  [name] 50-1000 MG tablet TAKE 1 TABLET BY MOUTH TWICE DAILY WITH A MEAL 12/15/15   Jerrol Banana., MD  lisinopril-hydrochlorothiazide (PRINZIDE,ZESTORETIC) 20-12.5 MG tablet TAKE 2 TABLETS BY MOUTH EVERY MORNING 03/17/15   Jerrol Banana., MD  meloxicam (MOBIC) 7.5 MG tablet TAKE 1 TABLET BY MOUTH TWICE DAILY AS NEEDED 12/19/14   Jerrol Banana., MD  nitroGLYCERIN (NITRO-DUR) 0.2 mg/hr Use 1/4 patch to each heel daily     Historical Provider, MD  pioglitazone (ACTOS) 45 MG tablet TAKE 1 TABLET(45 MG) BY MOUTH DAILY 11/16/15   Jerrol Banana., MD    Patient Active Problem List   Diagnosis Date Noted  . LVH (left ventricular hypertrophy) due to hypertensive disease, without heart failure 01/19/2016  . Pulmonary hypertension 02/10/2015  . Acquired hypothyroidism 09/17/2014  . Adaptation reaction 09/17/2014  . Absolute anemia 09/17/2014  . Cardiac dysrhythmia 09/17/2014  . Arthropathia 09/17/2014  . 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 Diabetes mellitus, type 2 ([place]) 09/17/2014  . Essential (primary) hypertension 09/17/2014  . HLD (hyperlipidemia) 09/17/2014  . Extreme obesity ([place]) 09/17/2014  . Vitamin D deficiency 09/17/2014  . Beat, premature ventricular 12/11/2013  . OBESITY 11/14/2007  . ACHILLES BURSITIS OR TENDINITIS 11/14/2007  .  BURSITIS, CALCANEAL 06/26/2007    Past Medical History:  Diagnosis Date  . Cancer ([place])    basal cell on leg    Social History   Social History  . Marital status: Married    Spouse name: N/A  . Number of children: N/A  . Years of education: N/A   Occupational History  . Not on file.   Social History Main Topics  . Smoking status: Never Smoker  . Smokeless tobacco: Never Used  . Alcohol use No  . Drug use: No  . Sexual activity: No    Other Topics Concern  . Not on file   Social History Narrative  . No narrative on file    Allergies  Allergen Reactions  . Amoxicillin   . Gabapentin Swelling  . Quinine Nausea Only and Nausea And Vomiting  . Dilaudid  [Hydromorphone Hcl] Rash  . Etodolac Rash  . Hydrochlorothiazide Rash  . Hydrocodone Rash  . Hydromorphone Rash  . Sulfa Antibiotics Rash    Review of Systems  Constitutional: Negative.   HENT: Negative.   Eyes: Negative.   Respiratory: Negative.   Cardiovascular: Negative.   Gastrointestinal: Negative.   Genitourinary: Negative.   Musculoskeletal: Positive for back pain.  Skin: Negative.   Neurological: Negative.   Endo/Heme/Allergies: Negative.   Psychiatric/Behavioral: Negative.     Immunization History  Administered Date(s) Administered  . Pneumococcal Conjugate-13 04/13/2015  . Pneumococcal Polysaccharide-23 02/17/2007, 02/27/2012  . Td 10/21/2003  . Zoster 11/08/2010   Objective:  BP 134/62 (BP Location: Left Arm, Patient Position: Sitting, Cuff Size: Large)   Pulse 74   Temp 97.9 F (36.6 C) (Oral)   Resp 16   Wt 259 lb (117.5 kg)   BMI 48.94 kg/m   Physical Exam  Constitutional: She is oriented to person, place, and time and well-developed, well-nourished, and in no distress.  Pleasant, cooperative, morbidly obese white female in no acute distress.  HENT:  Head: Normocephalic and atraumatic.  Eyes: Conjunctivae and EOM are normal. Pupils are equal, round, and reactive to light.  Neck: Normal range of motion. Neck supple. No thyromegaly present.  Cardiovascular: Normal rate, regular rhythm, normal heart sounds and intact distal pulses.   Pulmonary/Chest: Effort normal and breath sounds normal.  Abdominal: Soft.  Musculoskeletal: Normal range of motion. She exhibits edema (1+).  lymphedema   Neurological: She is alert and oriented to person, place, and time. She has normal reflexes. Gait normal. GCS score is 15.  Skin: Skin is  warm and dry.  Psychiatric: Mood, memory, affect and judgment normal.    Lab Results  Component Value Date   WBC 6.2 10/05/2015   HGB 10.8 (A) 03/17/2014   HCT 33.6 (L) 10/05/2015   PLT 266 10/05/2015   GLUCOSE 119 (H) 04/13/2015   CHOL 115 10/05/2015   TRIG 109 10/05/2015   HDL 55 10/05/2015   LDLCALC 38 10/05/2015   TSH 2.210 12/15/2014   INR 1.0 07/11/2011   HGBA1C 6.7 (H) 10/05/2015    CMP     Component Value Date/Time   NA 143 04/13/2015 0932   NA 140 07/28/2011 0144   K 4.6 04/13/2015 0932   K 4.2 07/28/2011 0144   CL 102 04/13/2015 0932   CL 106 07/28/2011 0144   CO2 20 04/13/2015 0932   CO2 23 07/28/2011 0144   GLUCOSE 119 (H) 04/13/2015 0932   GLUCOSE 153 (H) 07/28/2011 0144   BUN 25 04/13/2015 0932   BUN 33 (H) 07/28/2011 0144  CREATININE 1.12 (H) 04/13/2015 0932   CREATININE 1.17 08/31/2011 1039   CALCIUM 9.7 04/13/2015 0932   CALCIUM 8.5 07/28/2011 0144   PROT 6.5 12/15/2014 1106   ALBUMIN 4.0 04/13/2015 0932   AST 18 12/15/2014 1106   ALT 8 12/15/2014 1106   ALKPHOS 55 12/15/2014 1106   BILITOT 0.4 12/15/2014 1106   GFRNONAA 51 (L) 04/13/2015 0932   GFRNONAA 49 (L) 08/31/2011 1039   GFRAA 58 (L) 04/13/2015 0932   GFRAA 57 (L) 08/31/2011 1039    Assessment and Plan :  1. Type 2 diabetes mellitus without complication, without long-term current use of insulin ([name]) 6.4 today. Continue current medications.  - POCT HgB A1C--6.4--was 6.7 - Comprehensive metabolic panel  2. Essential (primary) hypertension Stable.  - TSH  3. Need for influenza vaccination  - Flu vaccine HIGH DOSE PF 4. Morbid obesity    HPI, Exam, and A&P Transcribed under the direction and in the presence of Richard L. Cranford Mon, MD  Electronically Signed: Webb Laws, CMA I have done the exam and reviewed the above chart and it is accurate to the best of my knowledge.  [name] MD [facility] 03/03/2016 11:33 AM

## 2016-03-04 LAB — COMPREHENSIVE METABOLIC PANEL
A/G RATIO: 1.4 (ref 1.2–2.2)
ALT: 7 IU/L (ref 0–32)
AST: 15 IU/L (ref 0–40)
Albumin: 4.2 g/dL (ref 3.6–4.8)
Alkaline Phosphatase: 65 IU/L (ref 39–117)
BUN/Creatinine Ratio: 28 (ref 12–28)
BUN: 30 mg/dL — AB (ref 8–27)
Bilirubin Total: 0.5 mg/dL (ref 0.0–1.2)
CALCIUM: 9.8 mg/dL (ref 8.7–10.3)
CO2: 21 mmol/L (ref 18–29)
Chloride: 100 mmol/L (ref 96–106)
Creatinine, Ser: 1.07 mg/dL — ABNORMAL HIGH (ref 0.57–1.00)
GFR, EST AFRICAN AMERICAN: 61 mL/min/{1.73_m2} (ref >59)
GFR, EST NON AFRICAN AMERICAN: 53 mL/min/{1.73_m2} — AB (ref >59)
Globulin, Total: 2.9 g/dL (ref 1.5–4.5)
Glucose: 129 mg/dL — ABNORMAL HIGH (ref 65–99)
Potassium: 4.5 mmol/L (ref 3.5–5.2)
Sodium: 137 mmol/L (ref 134–144)
TOTAL PROTEIN: 7.1 g/dL (ref 6.0–8.5)

## 2016-03-04 LAB — TSH: TSH: 2.14 u[IU]/mL (ref 0.450–4.500)

## 2016-03-07 ENCOUNTER — Telehealth: Payer: Self-pay

## 2016-03-07 NOTE — Telephone Encounter (Signed)
Pt advised-aa 

## 2016-03-07 NOTE — Telephone Encounter (Signed)
Called x 2, once busy tone other no sound on the other side of the phone-aa

## 2016-03-07 NOTE — Telephone Encounter (Signed)
-----   Message from Jerrol Banana., MD sent at 03/07/2016  8:19 AM EST ----- Labs OK

## 2016-03-17 ENCOUNTER — Other Ambulatory Visit: Payer: Self-pay | Admitting: Family Medicine

## 2016-03-17 DIAGNOSIS — Z1231 Encounter for screening mammogram for malignant neoplasm of breast: Secondary | ICD-10-CM

## 2016-04-06 ENCOUNTER — Other Ambulatory Visit: Payer: Self-pay | Admitting: Family Medicine

## 2016-04-06 NOTE — Telephone Encounter (Signed)
It states in her chart that she had rash from HCTZ, I looked in harmony this information came from her chart been abstracted from paper chart, I can not find a record where it mentions this allergy in anything that is scanned in harmony and in epic. She has been on Lisinopril-HCTZ since 2013 and has been doing well. I am not sure what to do about this been in her allergy record. Wanted to bring this to your attention-aa

## 2016-04-07 ENCOUNTER — Other Ambulatory Visit: Payer: Self-pay

## 2016-04-07 DIAGNOSIS — M7551 Bursitis of right shoulder: Secondary | ICD-10-CM

## 2016-04-12 DIAGNOSIS — M7542 Impingement syndrome of left shoulder: Secondary | ICD-10-CM | POA: Diagnosis not present

## 2016-04-21 ENCOUNTER — Telehealth: Payer: Self-pay | Admitting: Family Medicine

## 2016-04-21 NOTE — Telephone Encounter (Signed)
Called Pt to schedule AWV with [facility] for 3/21 @ 10am move f/up with Rosanna Randy to  3/21 @ 11am- knb

## 2016-05-02 DIAGNOSIS — D0511 Intraductal carcinoma in situ of right breast: Secondary | ICD-10-CM

## 2016-05-02 HISTORY — DX: Intraductal carcinoma in situ of right breast: D05.11

## 2016-05-11 ENCOUNTER — Ambulatory Visit (INDEPENDENT_AMBULATORY_CARE_PROVIDER_SITE_OTHER): Payer: Medicare Other

## 2016-05-11 VITALS — BP 129/60 | HR 76 | Temp 98.0°F | Ht 61.0 in | Wt 256.6 lb

## 2016-05-11 DIAGNOSIS — Z23 Encounter for immunization: Secondary | ICD-10-CM

## 2016-05-11 DIAGNOSIS — Z1159 Encounter for screening for other viral diseases: Secondary | ICD-10-CM | POA: Diagnosis not present

## 2016-05-11 DIAGNOSIS — Z0000 Encounter for general adult medical examination without abnormal findings: Secondary | ICD-10-CM | POA: Diagnosis not present

## 2016-05-11 NOTE — Patient Instructions (Addendum)
[name] , Thank you for taking time to come for your Medicare Wellness Visit. I appreciate your ongoing commitment to your health goals. Please review the following plan we discussed and let me know if I can assist you in the future.   These are the goals we discussed: Goals    None      This is a list of the screening recommended for you and due dates:  Health Maintenance  Topic Date Due  .  Hepatitis C: One time screening is recommended by Center for Disease Control  (CDC) for  adults born from [birth year] through 1964.   Carol Ramsey 10, 1947  . Tetanus Vaccine  10/20/2013  . Hemoglobin A1C  08/31/2016  . Eye exam for diabetics  09/02/2016  . Complete foot exam   10/04/2016  . Mammogram  02/24/2017  . Colon Cancer Screening  01/18/2022  . Flu Shot  Completed  . DEXA scan (bone density measurement)  Completed  . Shingles Vaccine  Completed  . Pneumonia vaccines  Completed   Preventive Care for Adults  A healthy lifestyle and preventive care can promote health and wellness. Preventive health guidelines for adults include the following key practices.  . A routine yearly physical is a good way to check with your health care provider about your health and preventive screening. It is a chance to share any concerns and updates on your health and to receive a thorough exam.  . Visit your dentist for a routine exam and preventive care every 6 months. Brush your teeth twice a day and floss once a day. Good oral hygiene prevents tooth decay and gum disease.  . The frequency of eye exams is based on your age, health, family medical history, use  of contact lenses, and other factors. Follow your health care provider's ecommendations for frequency of eye exams.  . Eat a healthy diet. Foods like vegetables, fruits, whole grains, low-fat dairy products, and lean protein foods contain the nutrients you need without too many calories. Decrease your intake of foods high in solid fats, added sugars, and salt.  Eat the right amount of calories for you. Get information about a proper diet from your health care provider, if necessary.  . Regular physical exercise is one of the most important things you can do for your health. Most adults should get at least 150 minutes of moderate-intensity exercise (any activity that increases your heart rate and causes you to sweat) each week. In addition, most adults need muscle-strengthening exercises on 2 or more days a week.  Silver Sneakers may be a benefit available to you. To determine eligibility, you may visit the website: www.silversneakers.com or contact program at 620-866-2267 Mon-Fri between 8AM-8PM.   . Maintain a healthy weight. The body mass index (BMI) is a screening tool to identify possible weight problems. It provides an estimate of body fat based on height and weight. Your health care provider can find your BMI and can help you achieve or maintain a healthy weight.   For adults 20 years and older: ? A BMI below 18.5 is considered underweight. ? A BMI of 18.5 to 24.9 is normal. ? A BMI of 25 to 29.9 is considered overweight. ? A BMI of 30 and above is considered obese.   . Maintain normal blood lipids and cholesterol levels by exercising and minimizing your intake of saturated fat. Eat a balanced diet with plenty of fruit and vegetables. Blood tests for lipids and cholesterol should begin at age 62 and  be repeated every 5 years. If your lipid or cholesterol levels are high, you are over 50, or you are at high risk for heart disease, you may need your cholesterol levels checked more frequently. Ongoing high lipid and cholesterol levels should be treated with medicines if diet and exercise are not working.  . If you smoke, find out from your health care provider how to quit. If you do not use tobacco, please do not start.  . If you choose to drink alcohol, please do not consume more than 2 drinks per day. One drink is considered to be 12 ounces (355  mL) of beer, 5 ounces (148 mL) of wine, or 1.5 ounces (44 mL) of liquor.  . If you are 52-42 years old, ask your health care provider if you should take aspirin to prevent strokes.  . Use sunscreen. Apply sunscreen liberally and repeatedly throughout the day. You should seek shade when your shadow is shorter than you. Protect yourself by wearing long sleeves, pants, a wide-brimmed hat, and sunglasses year round, whenever you are outdoors.  . Once a month, do a whole body skin exam, using a mirror to look at the skin on your back. Tell your health care provider of new moles, moles that have irregular borders, moles that are larger than a pencil eraser, or moles that have changed in shape or color.      Health Maintenance, Female Introduction Adopting a healthy lifestyle and getting preventive care can go a long way to promote health and wellness. Talk with your health care provider about what schedule of regular examinations is right for you. This is a good chance for you to check in with your provider about disease prevention and staying healthy. In between checkups, there are plenty of things you can do on your own. Experts have done a lot of research about which lifestyle changes and preventive measures are most likely to keep you healthy. Ask your health care provider for more information. Weight and diet Eat a healthy diet  Be sure to include plenty of vegetables, fruits, low-fat dairy products, and lean protein.  Do not eat a lot of foods high in solid fats, added sugars, or salt.  Get regular exercise. This is one of the most important things you can do for your health.  Most adults should exercise for at least 150 minutes each week. The exercise should increase your heart rate and make you sweat (moderate-intensity exercise).  Most adults should also do strengthening exercises at least twice a week. This is in addition to the moderate-intensity exercise. Maintain a healthy  weight  Body mass index (BMI) is a measurement that can be used to identify possible weight problems. It estimates body fat based on height and weight. Your health care provider can help determine your BMI and help you achieve or maintain a healthy weight.  For females 76 years of age and older:  A BMI below 18.5 is considered underweight.  A BMI of 18.5 to 24.9 is normal.  A BMI of 25 to 29.9 is considered overweight.  A BMI of 30 and above is considered obese. Watch levels of cholesterol and blood lipids  You should start having your blood tested for lipids and cholesterol at 70 years of age, then have this test every 5 years.  You may need to have your cholesterol levels checked more often if:  Your lipid or cholesterol levels are high.  You are older than 70 years of age.  You are at high risk for heart disease. Cancer screening Lung Cancer  Lung cancer screening is recommended for adults 25-110 years old who are at high risk for lung cancer because of a history of smoking.  A yearly low-dose CT scan of the lungs is recommended for people who:  Currently smoke.  Have quit within the past 15 years.  Have at least a 30-pack-year history of smoking. A pack year is smoking an average of one pack of cigarettes a day for 1 year.  Yearly screening should continue until it has been 15 years since you quit.  Yearly screening should stop if you develop a health problem that would prevent you from having lung cancer treatment. Breast Cancer  Practice breast self-awareness. This means understanding how your breasts normally appear and feel.  It also means doing regular breast self-exams. Let your health care provider know about any changes, no matter how small.  If you are in your 20s or 30s, you should have a clinical breast exam (CBE) by a health care provider every 1-3 years as part of a regular health exam.  If you are 35 or older, have a CBE every year. Also consider  having a breast X-ray (mammogram) every year.  If you have a family history of breast cancer, talk to your health care provider about genetic screening.  If you are at high risk for breast cancer, talk to your health care provider about having an MRI and a mammogram every year.  Breast cancer gene (BRCA) assessment is recommended for women who have family members with BRCA-related cancers. BRCA-related cancers include:  Breast.  Ovarian.  Tubal.  Peritoneal cancers.  Results of the assessment will determine the need for genetic counseling and BRCA1 and BRCA2 testing. Colorectal Cancer  This type of cancer can be detected and often prevented.  Routine colorectal cancer screening usually begins at 70 years of age and continues through 70 years of age.  Your health care provider may recommend screening at an earlier age if you have risk factors for colon cancer.  Your health care provider may also recommend using home test kits to check for hidden blood in the stool.  A small camera at the end of a tube can be used to examine your colon directly (sigmoidoscopy or colonoscopy). This is done to check for the earliest forms of colorectal cancer.  Routine screening usually begins at age 74.  Direct examination of the colon should be repeated every 5-10 years through 70 years of age. However, you may need to be screened more often if early forms of precancerous polyps or small growths are found. Skin Cancer  Check your skin from head to toe regularly.  Tell your health care provider about any new moles or changes in moles, especially if there is a change in a mole's shape or color.  Also tell your health care provider if you have a mole that is larger than the size of a pencil eraser.  Always use sunscreen. Apply sunscreen liberally and repeatedly throughout the day.  Protect yourself by wearing long sleeves, pants, a wide-brimmed hat, and sunglasses whenever you are outside. Heart  disease, diabetes, and high blood pressure  High blood pressure causes heart disease and increases the risk of stroke. High blood pressure is more likely to develop in:  People who have blood pressure in the high end of the normal range (130-139/85-89 mm Hg).  People who are overweight or obese.  People who are African  American.  If you are 46-32 years of age, have your blood pressure checked every 3-5 years. If you are 68 years of age or older, have your blood pressure checked every year. You should have your blood pressure measured twice-once when you are at a hospital or clinic, and once when you are not at a hospital or clinic. Record the average of the two measurements. To check your blood pressure when you are not at a hospital or clinic, you can use:  An automated blood pressure machine at a pharmacy.  A home blood pressure monitor.  If you are between 32 years and 54 years old, ask your health care provider if you should take aspirin to prevent strokes.  Have regular diabetes screenings. This involves taking a blood sample to check your fasting blood sugar level.  If you are at a normal weight and have a low risk for diabetes, have this test once every three years after 70 years of age.  If you are overweight and have a high risk for diabetes, consider being tested at a younger age or more often. Preventing infection Hepatitis B  If you have a higher risk for hepatitis B, you should be screened for this virus. You are considered at high risk for hepatitis B if:  You were born in a country where hepatitis B is common. Ask your health care provider which countries are considered high risk.  Your parents were born in a high-risk country, and you have not been immunized against hepatitis B (hepatitis B vaccine).  You have HIV or AIDS.  You use needles to inject street drugs.  You live with someone who has hepatitis B.  You have had sex with someone who has hepatitis  B.  You get hemodialysis treatment.  You take certain medicines for conditions, including cancer, organ transplantation, and autoimmune conditions. Hepatitis C  Blood testing is recommended for:  Everyone born from [birth year] through 1964.  Anyone with known risk factors for hepatitis C. Osteoporosis and menopause  Osteoporosis is a disease in which the bones lose minerals and strength with aging. This can result in serious bone fractures. Your risk for osteoporosis can be identified using a bone density scan.  If you are 58 years of age or older, or if you are at risk for osteoporosis and fractures, ask your health care provider if you should be screened.  Ask your health care provider whether you should take a calcium or vitamin D supplement to lower your risk for osteoporosis.  Menopause may have certain physical symptoms and risks.  Hormone replacement therapy may reduce some of these symptoms and risks. Talk to your health care provider about whether hormone replacement therapy is right for you. Follow these instructions at home:  Schedule regular health, dental, and eye exams.  Stay current with your immunizations.  Do not use any tobacco products including cigarettes, chewing tobacco, or electronic cigarettes.  If you are pregnant, do not drink alcohol.  If you are breastfeeding, limit how much and how often you drink alcohol.  Limit alcohol intake to no more than 1 drink per day for nonpregnant women. One drink equals 12 ounces of beer, 5 ounces of wine, or 1 ounces of hard liquor.  Do not use street drugs.  Do not share needles.  Ask your health care provider for help if you need support or information about quitting drugs.  Tell your health care provider if you often feel depressed.  Tell your health care  provider if you have ever been abused or do not feel safe at home. This information is not intended to replace advice given to you by your health care provider.  Make sure you discuss any questions you have with your health care provider. Document Released: 11/01/2010 Document Revised: 09/24/2015 Document Reviewed: 01/20/2015  2017 [employer]

## 2016-05-11 NOTE — Progress Notes (Signed)
Subjective:   Carol Ramsey is a 71 y.o. female who presents for Medicare Annual (Subsequent) preventive examination.  Review of Systems:  N/A  Cardiac Risk Factors include: advanced age (>57men, >8 women);obesity (BMI >30kg/m2);diabetes mellitus;dyslipidemia;hypertension     Objective:     Vitals: BP 129/60 (BP Location: Right Arm)   Pulse 76   Temp 98 F (36.7 C) (Oral)   Ht 5\' 1"  (1.549 m)   Wt 256 lb 9.6 oz (116.4 kg)   BMI 48.48 kg/m   Body mass index is 48.48 kg/m.   Tobacco History  Smoking Status  . Never Smoker  Smokeless Tobacco  . Never Used     Counseling given: Not Answered   Past Medical History:  Diagnosis Date  . 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 Diabetes mellitus without complication ([place])   . Hyperlipidemia   . Hypertension   . Obesity   . Thyroid disease    hyperthyroidism  . Vitamin D deficiency    Past Surgical History:  Procedure Laterality Date  . KNEE SURGERY Left    arthroscopy  . SKIN CANCER EXCISION     face   Family History  Problem Relation Age of Onset  . Hypertension Mother   . Cervical cancer Mother   . Heart disease Father   . Hypertension Father   . Lung cancer Father   . Lung cancer Sister   . Brain cancer Sister   . Diabetes Brother   . Heart disease Brother   . Hypertension Brother   . Stroke Brother    History  Sexual Activity  . Sexual activity: No    Outpatient Encounter Prescriptions as of 05/11/2016  Medication Sig  . amLODipine (NORVASC) 5 MG tablet TAKE 1 TABLET BY MOUTH EVERY DAY  . aspirin 81 MG tablet Take by mouth.  [name] atorvastatin (LIPITOR) 10 MG tablet Take by mouth.  . cholecalciferol (VITAMIN D) 1000 UNITS tablet Take by mouth.  . clindamycin (CLEOCIN) 150 MG capsule Take 150 mg by mouth as needed.  [name] glipiZIDE (GLUCOTROL XL) 5 MG 24 hr tablet TAKE 2 TABLETS(10 MG) BY MOUTH DAILY (Patient taking differently: TAKE 1 TABLETS BY MOUTH DAILY)  . glucose blood (ONETOUCH VERIO) test  strip Use as instructed; Check blood sugar twice daily (Patient taking differently: Use as instructed; Check blood sugar twice daily)  . [name] 50-1000 MG tablet TAKE 1 TABLET BY MOUTH TWICE DAILY WITH A MEAL  . lisinopril-hydrochlorothiazide (PRINZIDE,ZESTORETIC) 20-12.5 MG tablet TAKE 2 TABLETS BY MOUTH EVERY MORNING  . meloxicam (MOBIC) 15 MG tablet Take 15 mg by mouth daily.  . pioglitazone (ACTOS) 45 MG tablet TAKE 1 TABLET(45 MG) BY MOUTH DAILY  . meloxicam (MOBIC) 7.5 MG tablet TAKE 1 TABLET BY MOUTH TWICE DAILY AS NEEDED (Patient not taking: Reported on 05/11/2016)  . [DISCONTINUED] glucose blood test strip BAYER CONTOUR TEST (In Vitro Strip)  1 (one) Strip Strip two times daily and as needed for 0 days  Quantity: 100;  Refills: 12   Ordered :05-Sep-2013  [name] ;  Started 05-Sep-2013 Active Comments: Medication taken as needed. diagnosis code 250.00   No facility-administered encounter medications on file as of 05/11/2016.     Activities of Daily Living In your present state of health, do you have any difficulty performing the following activities: 05/11/2016 03/03/2016  Hearing? N N  Vision? N N  Difficulty concentrating or making decisions? N N  Walking or climbing stairs? Tempie Donning  Dressing or bathing? N N  Doing errands, shopping? N N  Preparing Food and eating ? N -  Using the Toilet? N -  In the past six months, have you accidently leaked urine? N -  Do you have problems with loss of bowel control? N -  Managing your Medications? N -  Managing your Finances? N -  Housekeeping or managing your Housekeeping? N -  Some recent data might be hidden    Patient Care Team: Jerrol Banana., MD as PCP - General (Family Medicine) Dereck Leep, MD as Consulting Physician (Orthopedic Surgery) Kennieth Francois, MD as Consulting Physician (Dermatology) Corey Skains, MD as Consulting Physician (Cardiology) Thelma Comp, OD as Consulting Physician (Optometry)      Assessment:     Exercise Activities and Dietary recommendations Current Exercise Habits: Structured exercise class (gym), Type of exercise: strength training/weights, Time (Minutes): 15, Frequency (Times/Week): 3, Weekly Exercise (Minutes/Week): 45, Intensity: Mild  Goals    . Have 3 meals a day          Starting 05/12/15, I will work to make sure I am eating 3 meals a day.      Fall Risk Fall Risk  05/11/2016 03/03/2016 02/10/2015 02/10/2015 12/15/2014  Falls in the past year? No No No No No   Depression Screen PHQ 2/9 Scores 05/11/2016 03/03/2016 02/10/2015 02/10/2015  PHQ - 2 Score 0 0 0 0     Cognitive Function     6CIT Screen 05/11/2016  What Year? 0 points  What month? 0 points  What time? 0 points  Count back from 20 0 points  Months in reverse 0 points  Repeat phrase 0 points  Total Score 0    Immunization History  Administered Date(s) Administered  . Influenza, High Dose Seasonal PF 03/03/2016  . Pneumococcal Conjugate-13 04/13/2015  . Pneumococcal Polysaccharide-23 02/17/2007, 02/27/2012  . Td 10/21/2003  . Tdap 05/11/2016  . Zoster 11/08/2010   Screening Tests Health Maintenance  Topic Date Due  . HEMOGLOBIN A1C  08/31/2016  . OPHTHALMOLOGY EXAM  09/02/2016  . FOOT EXAM  10/04/2016  . MAMMOGRAM  02/24/2017  . COLONOSCOPY  01/18/2022  . TETANUS/TDAP  05/11/2026  . INFLUENZA VACCINE  Completed  . DEXA SCAN  Completed  . ZOSTAVAX  Completed  . Hepatitis C Screening  Completed  . PNA vac Low Risk Adult  Completed      Plan:  I have personally reviewed and addressed the Medicare Annual Wellness questionnaire and have noted the following in the patient's chart:  A. Medical and social history B. Use of alcohol, tobacco or illicit drugs  C. Current medications and supplements D. Functional ability and status E.  Nutritional status F.  Physical activity G. Advance directives H. List of other physicians I.  Hospitalizations, surgeries, and ER visits  in previous 12 months J.  [place] such as hearing and vision if needed, cognitive and depression L. Referrals and appointments   In addition, I have reviewed and discussed with patient certain preventive protocols, quality metrics, and best practice recommendations. A written personalized care plan for preventive services as well as general preventive health recommendations were provided to patient.  See attached scanned questionnaire for additional information.   Signed,  Fabio Neighbors, LPN Nurse Health Advisor   MD Recommendations: none I have reviewed the health advisors note, was  available for consultation and I agree with documentation and plan. [name] MD [facility]

## 2016-05-12 LAB — HEPATITIS C ANTIBODY: Hep C Virus Ab: <0.1 s/co ratio (ref 0.0–0.9)

## 2016-05-24 DIAGNOSIS — D225 Melanocytic nevi of trunk: Secondary | ICD-10-CM | POA: Diagnosis not present

## 2016-05-24 DIAGNOSIS — L570 Actinic keratosis: Secondary | ICD-10-CM | POA: Diagnosis not present

## 2016-05-24 DIAGNOSIS — Z85828 Personal history of other malignant neoplasm of skin: Secondary | ICD-10-CM | POA: Diagnosis not present

## 2016-05-24 DIAGNOSIS — D2272 Melanocytic nevi of left lower limb, including hip: Secondary | ICD-10-CM | POA: Diagnosis not present

## 2016-05-24 DIAGNOSIS — X32XXXA Exposure to sunlight, initial encounter: Secondary | ICD-10-CM | POA: Diagnosis not present

## 2016-05-24 DIAGNOSIS — D2261 Melanocytic nevi of right upper limb, including shoulder: Secondary | ICD-10-CM | POA: Diagnosis not present

## 2016-05-25 ENCOUNTER — Ambulatory Visit
Admission: RE | Admit: 2016-05-25 | Discharge: 2016-05-25 | Disposition: A | Discharge status: Home / Self Care | Payer: Medicare Other | Source: Ambulatory Visit | Attending: Family Medicine | Admitting: Family Medicine

## 2016-05-25 DIAGNOSIS — R921 Mammographic calcification found on diagnostic imaging of breast: Secondary | ICD-10-CM | POA: Insufficient documentation

## 2016-05-25 DIAGNOSIS — Z1231 Encounter for screening mammogram for malignant neoplasm of breast: Secondary | ICD-10-CM | POA: Diagnosis not present

## 2016-05-27 ENCOUNTER — Other Ambulatory Visit: Payer: Self-pay | Admitting: Family Medicine

## 2016-05-27 DIAGNOSIS — R921 Mammographic calcification found on diagnostic imaging of breast: Secondary | ICD-10-CM

## 2016-05-27 DIAGNOSIS — R928 Other abnormal and inconclusive findings on diagnostic imaging of breast: Secondary | ICD-10-CM

## 2016-06-13 ENCOUNTER — Other Ambulatory Visit: Payer: Self-pay | Admitting: Family Medicine

## 2016-06-21 ENCOUNTER — Ambulatory Visit
Admission: RE | Admit: 2016-06-21 | Discharge: 2016-06-21 | Disposition: A | Discharge status: Home / Self Care | Payer: Medicare Other | Source: Ambulatory Visit | Attending: Family Medicine | Admitting: Family Medicine

## 2016-06-21 ENCOUNTER — Other Ambulatory Visit: Payer: Self-pay | Admitting: Family Medicine

## 2016-06-21 DIAGNOSIS — R921 Mammographic calcification found on diagnostic imaging of breast: Secondary | ICD-10-CM

## 2016-06-21 DIAGNOSIS — R928 Other abnormal and inconclusive findings on diagnostic imaging of breast: Secondary | ICD-10-CM

## 2016-06-28 ENCOUNTER — Ambulatory Visit
Admission: RE | Admit: 2016-06-28 | Discharge: 2016-06-28 | Disposition: A | Discharge status: Home / Self Care | Payer: Medicare Other | Source: Ambulatory Visit | Attending: Family Medicine | Admitting: Family Medicine

## 2016-06-28 DIAGNOSIS — R921 Mammographic calcification found on diagnostic imaging of breast: Secondary | ICD-10-CM | POA: Diagnosis not present

## 2016-06-28 DIAGNOSIS — R928 Other abnormal and inconclusive findings on diagnostic imaging of breast: Secondary | ICD-10-CM | POA: Diagnosis present

## 2016-06-28 DIAGNOSIS — C50919 Malignant neoplasm of unspecified site of unspecified female breast: Secondary | ICD-10-CM

## 2016-06-28 DIAGNOSIS — D0511 Intraductal carcinoma in situ of right breast: Secondary | ICD-10-CM | POA: Diagnosis not present

## 2016-06-28 HISTORY — DX: Malignant neoplasm of unspecified site of unspecified female breast: C50.919

## 2016-06-28 HISTORY — PX: BREAST BIOPSY: SHX20

## 2016-06-29 LAB — SURGICAL PATHOLOGY

## 2016-06-30 ENCOUNTER — Encounter: Payer: Self-pay | Admitting: General Surgery

## 2016-06-30 ENCOUNTER — Inpatient Hospital Stay: Payer: Self-pay

## 2016-06-30 ENCOUNTER — Ambulatory Visit (INDEPENDENT_AMBULATORY_CARE_PROVIDER_SITE_OTHER): Payer: Medicare Other | Admitting: General Surgery

## 2016-06-30 VITALS — BP 158/74 | HR 110 | Resp 16 | Ht 63.0 in | Wt 254.0 lb

## 2016-06-30 DIAGNOSIS — N6310 Unspecified lump in the right breast, unspecified quadrant: Secondary | ICD-10-CM

## 2016-06-30 DIAGNOSIS — D0511 Intraductal carcinoma in situ of right breast: Secondary | ICD-10-CM

## 2016-06-30 NOTE — Progress Notes (Addendum)
Patient ID: Carol Ramsey, female   DOB: [birth year], 71 y.o.   MRN: [id number]  Chief Complaint  Patient presents with  . Breast Problem    HPI Carol Ramsey is a 71 y.o. female.  who presents for a breast evaluation. The most recent right mammogram was done on 06-21-16 with right breast biopsy 06-28-16 .  Patient does perform regular self breast checks and does not get regular mammograms done.  Husband Sonia Side and Telford Nab present at visit.  The patient has had no previous breast problems.  She is a retired Pharmacist, hospital. She reports that she's had diabetes for about 30 years. A.m. fasting blood sugars run between 80 and 106.  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   HPI  Past Medical History:  Diagnosis Date  . Breast cancer ([place]) 06/28/2016   right breast  . 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 Diabetes mellitus without complication ([place])   . Hyperlipidemia   . Hypertension   . Obesity   . Thyroid disease    hyperthyroidism  . Vitamin D deficiency     Past Surgical History:  Procedure Laterality Date  . BREAST BIOPSY Right 06/28/2016   stereotactic biopsy - DUCTAL CARCINOMA IN SITU (DCIS), HIGH NUCLEAR GRADE   . KNEE SURGERY Left    arthroscopy  . SKIN CANCER EXCISION     face    Family History  Problem Relation Age of Onset  . Hypertension Mother   . Cervical cancer Mother   . Heart disease Father   . Hypertension Father   . Lung cancer Father   . Lung cancer Sister   . Brain cancer Sister   . Diabetes Brother   . Heart disease Brother   . Hypertension Brother   . Stroke Brother     Social History Social History  Substance Use Topics  . Smoking status: Never Smoker  . Smokeless tobacco: Never Used  . Alcohol use No    Allergies  Allergen Reactions  . Amoxicillin   . Gabapentin Swelling  . Quinine Nausea Only and Nausea And Vomiting  . Dilaudid  [Hydromorphone Hcl] Rash  . Etodolac Rash  . Hydrochlorothiazide Rash  . Hydrocodone Rash  .  Hydromorphone Rash  . Sulfa Antibiotics Rash    Current Outpatient Prescriptions  Medication Sig Dispense Refill  . amLODipine (NORVASC) 5 MG tablet TAKE 1 TABLET BY MOUTH EVERY DAY 90 tablet 3  . aspirin 81 MG tablet Take by mouth.    [name] atorvastatin (LIPITOR) 10 MG tablet Take by mouth.    . cholecalciferol (VITAMIN D) 1000 UNITS tablet Take by mouth.    [name] glipiZIDE (GLUCOTROL XL) 5 MG 24 hr tablet TAKE 2 TABLETS(10 MG) BY MOUTH DAILY (Patient taking differently: TAKE 1 TABLETS BY MOUTH DAILY) 90 tablet 3  . glucose blood (ONETOUCH VERIO) test strip Use as instructed; Check blood sugar twice daily (Patient taking differently: Use as instructed; Check blood sugar twice daily) 100 each 12  . [name] 50-1000 MG tablet TAKE 1 TABLET BY MOUTH TWICE DAILY WITH A MEAL 60 tablet 5  . lisinopril-hydrochlorothiazide (PRINZIDE,ZESTORETIC) 20-12.5 MG tablet TAKE 2 TABLETS BY MOUTH EVERY MORNING 60 tablet 11  . meloxicam (MOBIC) 7.5 MG tablet TAKE 1 TABLET BY MOUTH TWICE DAILY AS NEEDED 60 tablet 0  . pioglitazone (ACTOS) 45 MG tablet TAKE 1 TABLET(45 MG) BY MOUTH DAILY 30 tablet 12   No current facility-administered medications for this visit.  Review of Systems Review of Systems  Constitutional: Negative.   Respiratory: Negative.   Cardiovascular: Negative.     Blood pressure (!) 158/74, pulse (!) 110, resp. rate 16, height 5\' 3"  (1.6 m), weight 254 lb (115.2 kg).  Physical Exam Physical Exam  Constitutional: She is oriented to person, place, and time. She appears well-developed and well-nourished.  Eyes: Conjunctivae are normal. No scleral icterus.  Neck: Neck supple.  Cardiovascular: Normal rate, regular rhythm and normal heart sounds.   Bilateral leg edema  Pulmonary/Chest: Effort normal and breath sounds normal. Right breast exhibits no inverted nipple, no nipple discharge, no skin change and no tenderness. Left breast exhibits no inverted nipple, no mass, no nipple discharge, no  skin change and no tenderness.    Abdominal: Soft. Bowel sounds are normal. There is no tenderness.  Neurological: She is alert and oriented to person, place, and time.  Skin: Skin is warm and dry.  Psychiatric: Her behavior is normal.    Data Reviewed 2016 as well as 2018 mammograms reviewed.  DIAGNOSIS:  A. BREAST, RIGHT, MEDIAL SUBAREOLAR; STEREOTACTIC CORE BIOPSY:  - DUCTAL CARCINOMA IN SITU (DCIS), HIGH NUCLEAR GRADE WITH  COMEDONECROSIS, ASSOCIATED WITH CALCIFICATIONS.    Ultrasound was completed to determine if preoperative needle localization would be required. In the retroareolar area at the 4:00 position, 1 cm from the nipple a well-defined biopsy cavity and marker is identified. This is within 8 mm of the overlying skin.  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    Plan    While the patient has no skin changes of the nipple areola, the location is very close to the retroareolar ductal structures. It is possible that this could be resected with appropriate 2 mm margins, but is possible that the DCIS extends into the retroareolar ducts which I have suggested that excision of the nipple/areolar complex by undertaken at the same setting.  Role of postoperative radiation therapy and possible hormone therapy was reviewed.  With the present finding of only high-grade DCIS, I would not find necessary to do additional imaging. Surgical resection of the nipple/areolar would not preclude later sentinel node biopsy if invasive cancer was identified.  Informational website provided to the family.  Opportunity for second surgical opinion reviewed.  Approximately 75 minutes was spent reviewing surgical options and reviewing records.  The patient will contact the office if she have any questions or concerns.    If patient does decide to proceed with surgery through our office, patient will require an in person pre-admission testing appointment.   This  information has been scribed by Gaspar Cola CMA.   Robert Bellow 06/30/2016, 3:51 PM

## 2016-06-30 NOTE — Progress Notes (Addendum)
  Oncology Nurse Navigator Documentation  Navigator Location: [facility] Onc (06/30/16 1300)   )Navigator Encounter Type: Introductory phone call (06/30/16 1300)                     Patient Visit Type: Initial (06/30/16 1300) Treatment Phase: Pre-Tx/Tx Discussion (06/30/16 1300) Barriers/Navigation Needs: Coordination of Care;Education (06/30/16 1300)   Interventions: Coordination of Care (06/30/16 1300)   Coordination of Care: Appts (06/30/16 1300)        Acuity: Level 2 (06/30/16 1300)         Time Spent with Patient: 30 (06/30/16 1300)  Phoned patient to introduce navigation service, and to schedule Surgical and Med/Onc appointment.  Patient is being seen  By Dr. Bary Castilla today at 3:00.  Notified Dr. Alben Spittle office.  Met patient at Dr. Dwyane Luo office, and gave her the Breast Cancer Treatment Handbook/folder with hospital services.

## 2016-06-30 NOTE — Patient Instructions (Signed)
The patient is aware to call back for any questions or concerns.  

## 2016-07-01 NOTE — Progress Notes (Signed)
  Oncology Nurse Navigator Documentation  Navigator Location: [facility] Onc (07/01/16 1400) Referral date to RadOnc/MedOnc: 07/05/16 (07/01/16 1400) )Navigator Encounter Type: Telephone (07/01/16 1400) Telephone: Incoming Call;Outgoing Call;Appt Confirmation/Clarification (07/01/16 1400)                   Patient Visit Type: Follow-up (07/01/16 1400) Treatment Phase: Pre-Tx/Tx Discussion (07/01/16 1400) Barriers/Navigation Needs: Coordination of Care;Education (07/01/16 1400) Education: Accessing Care/ Finding Providers;Coping with Diagnosis/ Prognosis;Newly Diagnosed Cancer Education (07/01/16 1400) Interventions: Coordination of Care;Education (07/01/16 1400)   Coordination of Care: Appts (07/01/16 1400)        Acuity: Level 2 (07/01/16 1400)   Acuity Level 2: Initial guidance, education and coordination as needed;Educational needs;Assistance expediting appointments (07/01/16 1400)     Time Spent with Patient: 30 (07/01/16 1400)   Phoned patient as follow-up to surgical consult, and to schedule Med/Onc appointment.  She is scheduled to see Dr. Grayland Ormond on 07/05/16 at 8:30.  Will finalize surgical decision at that visit, and notify Dr. Bary Castilla.  States she felt less anxious after navigator phone call and surgical consult.    Notified Dr. Alben Spittle office of appointment, and to send referral.

## 2016-07-04 DIAGNOSIS — D0511 Intraductal carcinoma in situ of right breast: Secondary | ICD-10-CM | POA: Insufficient documentation

## 2016-07-04 NOTE — Progress Notes (Signed)
[place]  Telephone:([phone] Fax:(336) [phone]  ID: Carol Ramsey OB: July 05, 1945  MR#: [id number]  ZES#:[id number]  Patient Care Team: Jerrol Banana., MD as PCP - General (Family Medicine) Dereck Leep, MD as Consulting Physician (Orthopedic Surgery) Kennieth Francois, MD as Consulting Physician (Dermatology) Corey Skains, MD as Consulting Physician (Cardiology) Thelma Comp, OD as Consulting Physician (Optometry)  CHIEF COMPLAINT: Right breast high grade DCIS  INTERVAL HISTORY: Patient is a 71 year old female who was noted to have an abnormality on routine breast screening mammogram. Subsequent ultrasound and biopsy revealed high-grade DCIS. She currently feels well and is asymptomatic. She has no neurologic complaints. She denies any recent fevers or illnesses. She has a good appetite and denies weight loss. She has no chest pain or shortness of breath. She denies any nausea, vomiting, constipation, or diarrhea. She has no urinary complaints. Patient feels at her baseline and offers no specific complaints today.  REVIEW OF SYSTEMS:   Review of Systems  Constitutional: Negative.  Negative for fever, malaise/fatigue and weight loss.  Respiratory: Negative.  Negative for cough and shortness of breath.   Cardiovascular: Negative.  Negative for chest pain and leg swelling.  Gastrointestinal: Negative.  Negative for abdominal pain.  Genitourinary: Negative.   Musculoskeletal: Negative.   Neurological: Negative.  Negative for weakness.  Psychiatric/Behavioral: Negative.  The patient is not nervous/anxious.     As per HPI. Otherwise, a complete review of systems is negative.  PAST MEDICAL HISTORY: Past Medical History:  Diagnosis Date  . Breast cancer ([place]) 06/28/2016   right breast  . Cancer ([place])    basal cell on leg  . Diabetes mellitus without complication ([place])   . Hyperlipidemia   . Hypertension   . Obesity   . Thyroid disease    hyperthyroidism  . Vitamin D deficiency     PAST SURGICAL HISTORY: Past Surgical History:  Procedure Laterality Date  . BREAST BIOPSY Right 06/28/2016   stereotactic biopsy - DUCTAL CARCINOMA IN SITU (DCIS), HIGH NUCLEAR GRADE   . KNEE SURGERY Left    arthroscopy  . SKIN CANCER EXCISION     face    FAMILY HISTORY: Family History  Problem Relation Age of Onset  . Hypertension Mother   . Cervical cancer Mother   . Heart disease Father   . Hypertension Father   . Lung cancer Father   . Lung cancer Sister   . Brain cancer Sister   . Diabetes Brother   . Heart disease Brother   . Hypertension Brother   . Stroke Brother     ADVANCED DIRECTIVES (Y/N):  N  HEALTH MAINTENANCE: Social History  Substance Use Topics  . Smoking status: Never Smoker  . Smokeless tobacco: Never Used  . Alcohol use No     Colonoscopy:  PAP:  Bone density:  Lipid panel:  Allergies  Allergen Reactions  . Gabapentin Swelling  . Quinine Nausea Only and Nausea And Vomiting  . Amoxicillin Itching and Rash    Has patient had a PCN reaction causing immediate rash, facial/tongue/throat swelling, SOB or lightheadedness with hypotension: No Has patient had a PCN reaction causing severe rash involving mucus membranes or skin necrosis: No Has patient had a PCN reaction that required hospitalization No Has patient had a PCN reaction occurring within the last 10 years: No If all of the above answers are "NO", then may proceed with Cephalosporin use.   . Dilaudid  [Hydromorphone Hcl] Rash  . Etodolac  Rash  . Hydrochlorothiazide Rash  . Hydrocodone Rash  . Hydromorphone Rash  . Sulfa Antibiotics Rash    Current Outpatient Prescriptions  Medication Sig Dispense Refill  . amLODipine (NORVASC) 5 MG tablet TAKE 1 TABLET BY MOUTH EVERY DAY 90 tablet 3  . aspirin 81 MG tablet Take 81 mg by mouth daily. Hasn't taken in 1 week    . atorvastatin (LIPITOR) 10 MG tablet Take 10 mg by mouth daily at 6 PM.       . glipiZIDE (GLUCOTROL XL) 5 MG 24 hr tablet TAKE 2 TABLETS(10 MG) BY MOUTH DAILY (Patient taking differently: TAKE 1 TABLETS BY MOUTH DAILY) 90 tablet 3  . glucose blood (ONETOUCH VERIO) test strip Use as instructed; Check blood sugar twice daily (Patient taking differently: Use as instructed; Check blood sugar twice daily) 100 each 12  . [name] 50-1000 MG tablet TAKE 1 TABLET BY MOUTH TWICE DAILY WITH A MEAL 60 tablet 5  . lisinopril-hydrochlorothiazide (PRINZIDE,ZESTORETIC) 20-12.5 MG tablet TAKE 2 TABLETS BY MOUTH EVERY MORNING 60 tablet 11  . pioglitazone (ACTOS) 45 MG tablet TAKE 1 TABLET(45 MG) BY MOUTH DAILY 30 tablet 12  . acetaminophen (TYLENOL) 500 MG tablet Take 500 mg by mouth every 6 (six) hours as needed for mild pain or moderate pain.    . Cholecalciferol (VITAMIN D) 2000 units CAPS Take 2,000 Units by mouth daily.     . meloxicam (MOBIC) 15 MG tablet Take 15 mg by mouth daily.     . meloxicam (MOBIC) 7.5 MG tablet TAKE 1 TABLET BY MOUTH TWICE DAILY AS NEEDED (Patient not taking: Reported on 07/05/2016) 60 tablet 0   No current facility-administered medications for this visit.     OBJECTIVE: Vitals:   07/05/16 0843  BP: (!) 181/72  Pulse: 86  Resp: 18  Temp: 97.4 F (36.3 C)     Body mass index is 44.05 kg/m.    ECOG FS:0 - Asymptomatic  General: Well-developed, well-nourished, no acute distress. Eyes: Pink conjunctiva, anicteric sclera. Breasts: Patient declined breast exam today. HEENT: Normocephalic, moist mucous membranes, clear oropharnyx. Lungs: Clear to auscultation bilaterally. Heart: Regular rate and rhythm. No rubs, murmurs, or gallops. Abdomen: Soft, nontender, nondistended. No organomegaly noted, normoactive bowel sounds. Musculoskeletal: No edema, cyanosis, or clubbing. Neuro: Alert, answering all questions appropriately. Cranial nerves grossly intact. Skin: No rashes or petechiae noted. Psych: Normal affect. Lymphatics: No cervical, calvicular,  axillary or inguinal LAD.   LAB RESULTS:  Lab Results  Component Value Date   NA 137 03/03/2016   K 4.5 03/03/2016   CL 100 03/03/2016   CO2 21 03/03/2016   GLUCOSE 129 (H) 03/03/2016   BUN 30 (H) 03/03/2016   CREATININE 1.07 (H) 03/03/2016   CALCIUM 9.8 03/03/2016   PROT 7.1 03/03/2016   ALBUMIN 4.2 03/03/2016   AST 15 03/03/2016   ALT 7 03/03/2016   ALKPHOS 65 03/03/2016   BILITOT 0.5 03/03/2016   GFRNONAA 53 (L) 03/03/2016   GFRAA 61 03/03/2016    Lab Results  Component Value Date   WBC 6.2 10/05/2015   NEUTROABS 3.2 10/05/2015   HGB 10.8 (A) 03/17/2014   HCT 33.6 (L) 10/05/2015   MCV 83 10/05/2015   PLT 266 10/05/2015     STUDIES: Mm Digital Diagnostic Unilat R  Result Date: 06/21/2016 CLINICAL DATA:  Screening recall for right breast calcifications. EXAM: DIGITAL DIAGNOSTIC RIGHT MAMMOGRAM WITH CAD COMPARISON:  Previous exam(s). ACR Breast Density Category b: There are scattered areas of fibroglandular density.  FINDINGS: In the subareolar right breast, there is a 1.2 cm group of fine pleomorphic calcifications. Mammographic images were processed with CAD. IMPRESSION: There is an indeterminate group of calcifications in the subareolar right breast. RECOMMENDATION: Stereotactic biopsy is recommended for the subareolar right breast calcifications. I have discussed the findings and recommendations with the patient. Results were also provided in writing at the conclusion of the visit. If applicable, a reminder letter will be sent to the patient regarding the next appointment. BI-RADS CATEGORY  4: Suspicious. Electronically Signed   By: Ammie Ferrier M.D.   On: 06/21/2016 14:25   US Breast Complete Uni Right Inc Axilla  Result Date: 06/30/2016 Ultrasound was completed to determine if preoperative needle localization would be required. In the retroareolar area at the 4:00 position, 1 cm from the nipple a well-defined biopsy cavity and marker is identified. This is within 8  mm of the overlying skin.   Mm Clip Placement Right  Result Date: 06/28/2016 CLINICAL DATA:  Post biopsy mammogram of the right breast for clip placement. EXAM: DIAGNOSTIC RIGHT MAMMOGRAM POST STEREOTACTIC BIOPSY COMPARISON:  Previous exam(s). FINDINGS: Mammographic images were obtained following stereotactic guided biopsy of right breast calcifications. The cylindrical shaped biopsy marking clip is appropriately positioned at the site of biopsied calcifications in the subareolar slightly medial right breast. IMPRESSION: Appropriate positioning of the cylindrical shaped biopsy marking clip in the subareolar slightly medial right breast. Final Assessment: Post Procedure Mammograms for Marker Placement Electronically Signed   By: Ammie Ferrier M.D.   On: 06/28/2016 13:54   Mm Rt Breast Bx W Loc Dev 1st Lesion Image Bx Spec Stereo Guide  Addendum Date: 06/29/2016   ADDENDUM REPORT: 06/29/2016 16:00 ADDENDUM: PATHOLOGY: DIAGNOSIS: A. BREAST, RIGHT, MEDIAL SUBAREOLAR; STEREOTACTIC CORE BIOPSY: - DUCTAL CARCINOMA IN SITU (DCIS), HIGH NUCLEAR GRADE WITH COMEDONECROSIS, ASSOCIATED WITH CALCIFICATIONS. Comment: ER and PR are deferred to the excision specimen. High-grade DCIS is present in multiple core fragments, some foci associated with calcifications and others without calcifications. CONCORDANT: YES I telephoned the patient on 06/29/2016 at 4:00 p.m. and discussed these results and the recommendations stated below. All questions were answered. The patient denies significant pain or bleeding from the biopsy site. The patient was asked to call the [facility] with any questions or issues related to the biopsy. RECOMMENDATION: Surgical consultation is recommended. The patient will be contacted by one of the nurse navigators from the cancer center with a surgical referral. Electronically Signed   By: Pamelia Hoit M.D.   On: 06/29/2016 16:00   Result Date: 06/29/2016 CLINICAL DATA:  71 year old female  presenting for stereotactic biopsy of right breast calcifications. EXAM: Choose 3 BREAST STEREOTACTIC CORE NEEDLE BIOPSY COMPARISON:  Previous exams. FINDINGS: The patient and I discussed the procedure of stereotactic-guided biopsy including benefits and alternatives. We discussed the high likelihood of a successful procedure. We discussed the risks of the procedure including infection, bleeding, tissue injury, clip migration, and inadequate sampling. Informed written consent was given. The usual time out protocol was performed immediately prior to the procedure. Using sterile technique and 1% Lidocaine as local anesthetic, under stereotactic guidance, a 9 gauge vacuum assisted device was used to perform core needle biopsy of calcifications in the subareolar medial right breast using a medial approach. Specimen radiograph was performed showing calcifications within multiple core samples. Specimens with calcifications are identified for pathology. At the conclusion of the procedure, a cylinder shaped tissue marker clip was deployed into the biopsy cavity. Follow-up 2-view mammogram  was performed and dictated separately. IMPRESSION: Stereotactic-guided biopsy of subareolar medial right breast calcifications. No apparent complications. Electronically Signed: By: Ammie Ferrier M.D. On: 06/28/2016 13:53    ASSESSMENT: Right breast high grade DCIS.  PLAN:    1. Right breast high grade DCIS: Patient has agreed to pursue lumpectomy and this is scheduled on July 18, 2016. It is unlikely patient will require chemotherapy unless her final pathology specimen returns with an invasive component. She will require adjuvant XRT. Patient will also require tamoxifen for total 5 years at the conclusion of XRT. Return to clinic 1 to 2 weeks after her surgery to discuss her final pathology results and additional treatment planning.  Approximately 45 minutes was spent in discussion of which greater than 50% was  consultation.  Patient expressed understanding and was in agreement with this plan. She also understands that She can call clinic at any time with any questions, concerns, or complaints.   Cancer Staging Ductal carcinoma in situ (DCIS) of right breast Staging form: Breast, AJCC 8th Edition - Clinical stage from 07/04/2016: Stage 0 (cTis (DCIS), cN0, cM0, ER: Not Assessed, PR: Not Assessed, HER2: Not Assessed) - Signed by Lloyd Huger, MD on 07/04/2016   Lloyd Huger, MD   07/08/2016 4:30 PM

## 2016-07-05 ENCOUNTER — Encounter: Payer: Self-pay | Admitting: *Deleted

## 2016-07-05 ENCOUNTER — Inpatient Hospital Stay: Payer: Medicare Other | Attending: Oncology | Admitting: Oncology

## 2016-07-05 DIAGNOSIS — I10 Essential (primary) hypertension: Secondary | ICD-10-CM | POA: Insufficient documentation

## 2016-07-05 DIAGNOSIS — Z801 Family history of malignant neoplasm of trachea, bronchus and lung: Secondary | ICD-10-CM | POA: Insufficient documentation

## 2016-07-05 DIAGNOSIS — E785 Hyperlipidemia, unspecified: Secondary | ICD-10-CM | POA: Insufficient documentation

## 2016-07-05 DIAGNOSIS — D0511 Intraductal carcinoma in situ of right breast: Secondary | ICD-10-CM | POA: Insufficient documentation

## 2016-07-05 DIAGNOSIS — E039 Hypothyroidism, unspecified: Secondary | ICD-10-CM | POA: Diagnosis not present

## 2016-07-05 DIAGNOSIS — Z809 Family history of malignant neoplasm, unspecified: Secondary | ICD-10-CM | POA: Insufficient documentation

## 2016-07-05 DIAGNOSIS — E119 Type 2 diabetes mellitus without complications: Secondary | ICD-10-CM | POA: Insufficient documentation

## 2016-07-05 DIAGNOSIS — E669 Obesity, unspecified: Secondary | ICD-10-CM | POA: Insufficient documentation

## 2016-07-05 DIAGNOSIS — Z7984 Long term (current) use of oral hypoglycemic drugs: Secondary | ICD-10-CM | POA: Insufficient documentation

## 2016-07-05 DIAGNOSIS — Z79899 Other long term (current) drug therapy: Secondary | ICD-10-CM | POA: Diagnosis not present

## 2016-07-05 DIAGNOSIS — Z85828 Personal history of other malignant neoplasm of skin: Secondary | ICD-10-CM

## 2016-07-05 DIAGNOSIS — E559 Vitamin D deficiency, unspecified: Secondary | ICD-10-CM | POA: Diagnosis not present

## 2016-07-05 NOTE — Progress Notes (Signed)
  Oncology Nurse Navigator Documentation  Navigator Location: [facility]-Med Onc (07/05/16 0900)   )Navigator Encounter Type: Initial MedOnc (07/05/16 0900)                     Patient Visit Type: MedOnc (07/05/16 0900) Treatment Phase: Pre-Tx/Tx Discussion (07/05/16 0900)   Education: Understanding Cancer/ Treatment Options;Coping with Diagnosis/ Prognosis (07/05/16 0900)              Acuity: Level 2 (07/05/16 0900)    Met patient, her husband and her son today during her initial medical oncology visit with Dr. Grayland Ormond.  He reviewed next steps for surgery, radiation therapy and probable antihormonal therapy.  Husband with questions regarding insurance coverage.  Encouraged him to talk with Loletha Grayer for specific information.  He is agreeable.  She is to call with her surgery date so we can schedule a 2 week follow-up with Dr. Grayland Ormond.     Time Spent with Patient: 45 (07/05/16 0900)

## 2016-07-05 NOTE — Progress Notes (Signed)
Patient here today as new evaluation regarding breast cancer.  Referred by Dr. Rosanna Randy.

## 2016-07-06 ENCOUNTER — Other Ambulatory Visit: Payer: Self-pay | Admitting: General Surgery

## 2016-07-06 ENCOUNTER — Telehealth: Payer: Self-pay

## 2016-07-06 NOTE — Telephone Encounter (Signed)
Patient has decided to schedule her breast surgery. The patient is scheduled for surgery at [facility] on 07/18/16. She will pre admit at the hospital on 07/11/16 at 9:45 am. She is aware of dates, time, and instructions. Pre admit paperwork has been sent to the patient.

## 2016-07-11 ENCOUNTER — Encounter
Admission: RE | Admit: 2016-07-11 | Discharge: 2016-07-11 | Disposition: A | Discharge status: Home / Self Care | Payer: Medicare Other | Source: Ambulatory Visit | Attending: General Surgery | Admitting: General Surgery

## 2016-07-11 DIAGNOSIS — I10 Essential (primary) hypertension: Secondary | ICD-10-CM | POA: Diagnosis not present

## 2016-07-11 DIAGNOSIS — D0511 Intraductal carcinoma in situ of right breast: Secondary | ICD-10-CM | POA: Insufficient documentation

## 2016-07-11 DIAGNOSIS — R9431 Abnormal electrocardiogram [ECG] [EKG]: Secondary | ICD-10-CM | POA: Insufficient documentation

## 2016-07-11 DIAGNOSIS — E119 Type 2 diabetes mellitus without complications: Secondary | ICD-10-CM | POA: Insufficient documentation

## 2016-07-11 DIAGNOSIS — Z01818 Encounter for other preprocedural examination: Secondary | ICD-10-CM | POA: Insufficient documentation

## 2016-07-11 HISTORY — DX: Thyrotoxicosis, unspecified without thyrotoxic crisis or storm: E05.90

## 2016-07-11 HISTORY — DX: Anemia, unspecified: D64.9

## 2016-07-11 HISTORY — DX: Unspecified osteoarthritis, unspecified site: M19.90

## 2016-07-11 LAB — BASIC METABOLIC PANEL
ANION GAP: 9 (ref 5–15)
BUN: 26 mg/dL — ABNORMAL HIGH (ref 6–20)
CALCIUM: 9.4 mg/dL (ref 8.9–10.3)
CO2: 25 mmol/L (ref 22–32)
CREATININE: 1.14 mg/dL — AB (ref 0.44–1.00)
Chloride: 106 mmol/L (ref 101–111)
GFR, EST AFRICAN AMERICAN: 56 mL/min — AB (ref >60)
GFR, EST NON AFRICAN AMERICAN: 48 mL/min — AB (ref >60)
Glucose, Bld: 155 mg/dL — ABNORMAL HIGH (ref 65–99)
Potassium: 3.8 mmol/L (ref 3.5–5.1)
Sodium: 140 mmol/L (ref 135–145)

## 2016-07-11 LAB — CBC
HCT: 34.5 % — ABNORMAL LOW (ref 35.0–47.0)
HEMOGLOBIN: 11.3 g/dL — AB (ref 12.0–16.0)
MCH: 27.5 pg (ref 26.0–34.0)
MCHC: 32.9 g/dL (ref 32.0–36.0)
MCV: 83.7 fL (ref 80.0–100.0)
PLATELETS: 245 10*3/uL (ref 150–440)
RBC: 4.12 MIL/uL (ref 3.80–5.20)
RDW: 14.9 % — ABNORMAL HIGH (ref 11.5–14.5)
WBC: 5.8 10*3/uL (ref 3.6–11.0)

## 2016-07-11 NOTE — Patient Instructions (Signed)
  Your procedure is scheduled on: 07/18/16 Report to Day Surgery. [facility] SECOND FLOOR To find out your arrival time please call [phone] between 1PM - 3PM on 07/15/16.  Remember: Instructions that are not followed completely may result in serious medical risk, up to and including death, or upon the discretion of your surgeon and anesthesiologist your surgery may need to be rescheduled.    __X__ 1. Do not eat food or drink liquids after midnight. No gum chewing or hard candies.     ____ 2. No Alcohol for 24 hours before or after surgery.   ____ 3. Do Not Smoke For 24 Hours Prior to Your Surgery.   ____ 4. Bring all medications with you on the day of surgery if instructed.    ___X_ 5. Notify your doctor if there is any change in your medical condition     (cold, fever, infections).       Do not wear jewelry, make-up, hairpins, clips or nail polish.  Do not wear lotions, powders, or perfumes. You may wear deodorant.  Do not shave 48 hours prior to surgery. Men may shave face and neck.  Do not bring valuables to the hospital.    [facility] is not responsible for any belongings or valuables.               Contacts, dentures or bridgework may not be worn into surgery.  Leave your suitcase in the car. After surgery it may be brought to your room.  For patients admitted to the hospital, discharge time is determined by your                treatment team.   Patients discharged the day of surgery will not be allowed to drive home.   Please read over the following fact sheets that you were given:   Surgical Site Infection Prevention   ___X_ Take these medicines the morning of surgery with A SIP OF WATER:    1. AMLODIPINE  2.   3.   4.  5.  6.  ____ Fleet Enema (as directed)   X____ Use CHG Soap as directed  ____ Use inhalers on the day of surgery  _X___ Stop metformin 2 days prior to surgery STOP [name]  2 DAYS BEFORE SURGERY   ____ Take 1/2 of usual insulin dose the  night before surgery and none on the morning of surgery.   ____ Stop Coumadin/Plavix/aspirin on   ____ Stop Anti-inflammatories on    ALREADY STOPPED MOBIC   ____ Stop supplements until after surgery.    ____ Bring C-Pap to the hospital.

## 2016-07-11 NOTE — Pre-Procedure Instructions (Signed)
EKG COMPARED WITH EKG OF 9/17

## 2016-07-12 ENCOUNTER — Encounter: Payer: Self-pay | Admitting: *Deleted

## 2016-07-12 ENCOUNTER — Telehealth: Payer: Self-pay | Admitting: Family Medicine

## 2016-07-12 LAB — HEMOGLOBIN A1C
HEMOGLOBIN A1C: 7.1 % — AB (ref 4.8–5.6)
Mean Plasma Glucose: 157 mg/dL

## 2016-07-12 NOTE — Telephone Encounter (Signed)
Patient was at [facility] for pre-admit testing.  Her HBGa1c was 7.1 and Carol Ramsey though you needed to know.

## 2016-07-12 NOTE — Pre-Procedure Instructions (Signed)
hgba1c called to dr byrnett's nurse,carolyn. Was ordered under incorrect order set. Faxed to dr Rosanna Randy.

## 2016-07-14 ENCOUNTER — Encounter: Payer: Self-pay | Admitting: Family Medicine

## 2016-07-14 ENCOUNTER — Ambulatory Visit (INDEPENDENT_AMBULATORY_CARE_PROVIDER_SITE_OTHER): Payer: Medicare Other | Admitting: Family Medicine

## 2016-07-14 VITALS — BP 148/60 | HR 78 | Temp 97.6°F | Resp 16 | Wt 247.0 lb

## 2016-07-14 DIAGNOSIS — E119 Type 2 diabetes mellitus without complications: Secondary | ICD-10-CM

## 2016-07-14 DIAGNOSIS — M1711 Unilateral primary osteoarthritis, right knee: Secondary | ICD-10-CM | POA: Diagnosis not present

## 2016-07-14 DIAGNOSIS — Z96652 Presence of left artificial knee joint: Secondary | ICD-10-CM | POA: Diagnosis not present

## 2016-07-14 DIAGNOSIS — I10 Essential (primary) hypertension: Secondary | ICD-10-CM

## 2016-07-14 LAB — POCT GLYCOSYLATED HEMOGLOBIN (HGB A1C): HEMOGLOBIN A1C: 7

## 2016-07-14 MED ORDER — METOPROLOL SUCCINATE ER 25 MG PO TB24: 25.0000 mg | ORAL_TABLET | Freq: Every day | ORAL | 11 refills | Status: DC

## 2016-07-14 NOTE — Progress Notes (Signed)
Subjective:  HPI  Diabetes Mellitus Type II, Follow-up:   Lab Results  Component Value Date   HGBA1C 7.1 (H) 07/11/2016   HGBA1C 6.4 03/03/2016   HGBA1C 6.7 (H) 10/05/2015    Last seen for diabetes 4 months ago.  Management since then includes none. She reports good compliance with treatment. She is not having side effects.  Home blood sugar records: 130's and below, no low blood sugars though  Episodes of hypoglycemia? no    Pertinent Labs:    Component Value Date/Time   CHOL 115 10/05/2015 1059   TRIG 109 10/05/2015 1059   HDL 55 10/05/2015 1059   LDLCALC 38 10/05/2015 1059   CREATININE 1.14 (H) 07/11/2016 1001   CREATININE 1.17 08/31/2011 1039    Wt Readings from Last 3 Encounters:  07/14/16 247 lb (112 kg)  07/05/16 248 lb 10.9 oz (112.8 kg)  06/30/16 254 lb (115.2 kg)    ------------------------------------------------------------------------   Hypertension, follow-up:  BP Readings from Last 3 Encounters:  07/14/16 (!) 148/60  07/11/16 (!) 164/67  07/05/16 (!) 181/72    She was last seen for hypertension 4 months ago.  BP at that visit was 164/67. Management since that visit includes none. She reports good compliance with treatment. She is not having side effects.  Outside blood pressures are higher the last couple of weeks due being diagnosed with early breast cancer of her right breast. She is experiencing none.  Patient denies chest pain, chest pressure/discomfort, claudication, dyspnea, exertional chest pressure/discomfort, fatigue, irregular heart beat, lower extremity edema, near-syncope, orthopnea, palpitations, paroxysmal nocturnal dyspnea, syncope and tachypnea.   Wt Readings from Last 3 Encounters:  07/14/16 247 lb (112 kg)  07/05/16 248 lb 10.9 oz (112.8 kg)  06/30/16 254 lb (115.2 kg)   ------------------------------------------------------------------------    Prior to Admission medications   Medication Sig Start Date End Date  Taking? Authorizing Provider  acetaminophen (TYLENOL) 500 MG tablet Take 500 mg by mouth every 6 (six) hours as needed for mild pain or moderate pain.    Historical Provider, MD  amLODipine (NORVASC) 5 MG tablet TAKE 1 TABLET BY MOUTH EVERY DAY 06/13/16   Jerrol Banana., MD  aspirin 81 MG tablet Take 81 mg by mouth daily. Hasn't taken in 1 week 08/30/11   Historical Provider, MD  atorvastatin (LIPITOR) 10 MG tablet Take 10 mg by mouth daily at 6 PM.     Historical Provider, MD  Cholecalciferol (VITAMIN D) 2000 units CAPS Take 2,000 Units by mouth daily.     Historical Provider, MD  glipiZIDE (GLUCOTROL XL) 5 MG 24 hr tablet TAKE 2 TABLETS(10 MG) BY MOUTH DAILY Patient taking differently: TAKE 1 TABLETS BY MOUTH DAILY 11/24/15   Jerrol Banana., MD  glucose blood (ONETOUCH VERIO) test strip Use as instructed; Check blood sugar twice daily Patient taking differently: Use as instructed; Check blood sugar twice daily 05/22/15   Leslie Langille Maceo Pro., MD  [name] 50-1000 MG tablet TAKE 1 TABLET BY MOUTH TWICE DAILY WITH A MEAL 12/15/15   Jerrol Banana., MD  lisinopril-hydrochlorothiazide (PRINZIDE,ZESTORETIC) 20-12.5 MG tablet TAKE 2 TABLETS BY MOUTH EVERY MORNING 04/06/16   Jerrol Banana., MD  meloxicam (MOBIC) 15 MG tablet Take 15 mg by mouth daily.  06/06/16   Historical Provider, MD  pioglitazone (ACTOS) 45 MG tablet TAKE 1 TABLET(45 MG) BY MOUTH DAILY 11/16/15   Jerrol Banana., MD    Patient Active Problem List  Diagnosis Date Noted  . 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 LVH (left ventricular hypertrophy) due to hypertensive disease, without heart failure 01/19/2016  . Pulmonary hypertension 02/10/2015  . Acquired hypothyroidism 09/17/2014  . Adaptation reaction 09/17/2014  . Absolute anemia 09/17/2014  . Cardiac dysrhythmia 09/17/2014  . Arthropathia 09/17/2014  . 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 Diabetes  mellitus, type 2 ([place]) 09/17/2014  . Essential (primary) hypertension 09/17/2014  . HLD (hyperlipidemia) 09/17/2014  . Extreme obesity ([place]) 09/17/2014  . Vitamin D deficiency 09/17/2014  . Beat, premature ventricular 12/11/2013  . OBESITY 11/14/2007  . ACHILLES BURSITIS OR TENDINITIS 11/14/2007  . BURSITIS, CALCANEAL 06/26/2007    Past Medical History:  Diagnosis Date  . Anemia   . Arthritis   . Breast cancer ([place]) 06/28/2016   right breast  . Cancer ([place])    basal cell on leg  . Diabetes mellitus without complication ([place])   . Hyperlipidemia   . Hypertension   . Hyperthyroidism   . Obesity   . Thyroid disease    hyperthyroidism  . Vitamin D deficiency     Social History   Social History  . Marital status: Married    Spouse name: N/A  . Number of children: N/A  . Years of education: N/A   Occupational History  . Not on file.   Social History Main Topics  . Smoking status: Never Smoker  . Smokeless tobacco: Never Used  . Alcohol use No  . Drug use: No  . Sexual activity: No   Other Topics Concern  . Not on file   Social History Narrative  . No narrative on file    Allergies  Allergen Reactions  . Gabapentin Swelling  . Quinine Nausea Only and Nausea And Vomiting  . Amoxicillin Itching and Rash    Has patient had a PCN reaction causing immediate rash, facial/tongue/throat swelling, SOB or lightheadedness with hypotension: No Has patient had a PCN reaction causing severe rash involving mucus membranes or skin necrosis: No Has patient had a PCN reaction that required hospitalization No Has patient had a PCN reaction occurring within the last 10 years: No If all of the above answers are "NO", then may proceed with Cephalosporin use.   . Dilaudid  [Hydromorphone Hcl] Rash  . Etodolac Rash  . Hydrochlorothiazide Rash  . Hydrocodone Rash  . Hydromorphone Rash  . Sulfa Antibiotics Rash    Review of Systems  Constitutional: Negative.   HENT: Negative.    Eyes: Negative.   Respiratory: Negative.   Cardiovascular: Negative.   Gastrointestinal: Negative.   Genitourinary: Negative.   Musculoskeletal: Negative.   Skin: Negative.   Neurological: Negative.   Endo/Heme/Allergies: Negative.   Psychiatric/Behavioral: Negative.     Immunization History  Administered Date(s) Administered  . Influenza, High Dose Seasonal PF 03/03/2016  . Pneumococcal Conjugate-13 04/13/2015  . Pneumococcal Polysaccharide-23 02/17/2007, 02/27/2012  . Td 10/21/2003  . Tdap 05/11/2016  . Zoster 11/08/2010    Objective:  BP (!) 148/60 (BP Location: Left Arm, Patient Position: Sitting, Cuff Size: Large)   Pulse 78   Temp 97.6 F (36.4 C) (Oral)   Resp 16   Wt 247 lb (112 kg)   BMI 43.75 kg/m   Physical Exam  Constitutional: She is oriented to person, place, and time and well-developed, well-nourished, and in no distress.  HENT:  Head: Normocephalic and atraumatic.  Right Ear: External ear normal.  Left Ear: External  ear normal.  Nose: Nose normal.  Eyes: Conjunctivae and EOM are normal. Pupils are equal, round, and reactive to light.  Neck: Normal range of motion. Neck supple.  Cardiovascular: Normal rate, regular rhythm, normal heart sounds and intact distal pulses.   Pulmonary/Chest: Effort normal and breath sounds normal.  Musculoskeletal: Normal range of motion.  Neurological: She is alert and oriented to person, place, and time. She has normal reflexes. Gait normal. GCS score is 15.  Skin: Skin is warm and dry.  Psychiatric: Mood, memory, affect and judgment normal.    Lab Results  Component Value Date   WBC 5.8 07/11/2016   HGB 11.3 (L) 07/11/2016   HCT 34.5 (L) 07/11/2016   PLT 245 07/11/2016   GLUCOSE 155 (H) 07/11/2016   CHOL 115 10/05/2015   TRIG 109 10/05/2015   HDL 55 10/05/2015   LDLCALC 38 10/05/2015   TSH 2.140 03/03/2016   INR 1.0 07/11/2011   HGBA1C 7.1 (H) 07/11/2016    CMP     Component Value Date/Time   NA 140  07/11/2016 1001   NA 137 03/03/2016 1156   NA 140 07/28/2011 0144   K 3.8 07/11/2016 1001   K 4.2 07/28/2011 0144   CL 106 07/11/2016 1001   CL 106 07/28/2011 0144   CO2 25 07/11/2016 1001   CO2 23 07/28/2011 0144   GLUCOSE 155 (H) 07/11/2016 1001   GLUCOSE 153 (H) 07/28/2011 0144   BUN 26 (H) 07/11/2016 1001   BUN 30 (H) 03/03/2016 1156   BUN 33 (H) 07/28/2011 0144   CREATININE 1.14 (H) 07/11/2016 1001   CREATININE 1.17 08/31/2011 1039   CALCIUM 9.4 07/11/2016 1001   CALCIUM 8.5 07/28/2011 0144   PROT 7.1 03/03/2016 1156   ALBUMIN 4.2 03/03/2016 1156   AST 15 03/03/2016 1156   ALT 7 03/03/2016 1156   ALKPHOS 65 03/03/2016 1156   BILITOT 0.5 03/03/2016 1156   GFRNONAA 48 (L) 07/11/2016 1001   GFRNONAA 49 (L) 08/31/2011 1039   GFRAA 56 (L) 07/11/2016 1001   GFRAA 57 (L) 08/31/2011 1039    Assessment and Plan :   1. Type 2 diabetes mellitus without complication, without long-term current use of insulin ([name])  - POCT HgB A1C--7.0 today  2. Essential (primary) hypertension  - metoprolol succinate (TOPROL-XL) 25 MG 24 hr tablet; Take 1 tablet (25 mg total) by mouth daily.  Dispense: 30 tablet; Refill: 11 3.Recent Diagnosis of Breast Cancer Surgery soon. 4.Morbid Obesity I have done the exam and reviewed the chart and it is accurate to the best of my knowledge. Development worker, community has been used and  any errors in dictation or transcription are unintentional. [name] M.D. [facility]  HPI, Exam, and A&P Transcribed under the direction and in the presence of Kelley Polinsky L. Cranford Mon, MD  Electronically Signed: Katina Dung, [place]. I have done the exam and reviewed the above chart and it is accurate to the best of my knowledge. Development worker, community has been used in this note in any air is in the dictation or transcription are unintentional.  [facility] 07/14/2016 3:33  PM

## 2016-07-17 MED ORDER — FAMOTIDINE 20 MG PO TABS
20.0000 mg | ORAL_TABLET | Freq: Once | ORAL | Status: AC
  Administered 2016-07-18: 20 mg via ORAL

## 2016-07-18 ENCOUNTER — Ambulatory Visit: Payer: Medicare Other

## 2016-07-18 ENCOUNTER — Ambulatory Visit
Admission: RE | Admit: 2016-07-18 | Discharge: 2016-07-18 | Disposition: A | Discharge status: Home / Self Care | Payer: Medicare Other | Source: Ambulatory Visit | Attending: General Surgery | Admitting: General Surgery

## 2016-07-18 ENCOUNTER — Encounter
Admission: RE | Disposition: A | Discharge status: Home / Self Care | Payer: Self-pay | Source: Ambulatory Visit | Attending: General Surgery

## 2016-07-18 ENCOUNTER — Ambulatory Visit: Payer: Medicare Other | Admitting: Anesthesiology

## 2016-07-18 ENCOUNTER — Ambulatory Visit: Payer: Medicare Other | Admitting: Family Medicine

## 2016-07-18 ENCOUNTER — Encounter: Payer: Self-pay | Admitting: *Deleted

## 2016-07-18 DIAGNOSIS — Z79899 Other long term (current) drug therapy: Secondary | ICD-10-CM | POA: Insufficient documentation

## 2016-07-18 DIAGNOSIS — C50111 Malignant neoplasm of central portion of right female breast: Secondary | ICD-10-CM | POA: Diagnosis not present

## 2016-07-18 DIAGNOSIS — D0511 Intraductal carcinoma in situ of right breast: Secondary | ICD-10-CM | POA: Insufficient documentation

## 2016-07-18 DIAGNOSIS — D649 Anemia, unspecified: Secondary | ICD-10-CM | POA: Diagnosis not present

## 2016-07-18 DIAGNOSIS — Z6841 Body Mass Index (BMI) 40.0 and over, adult: Secondary | ICD-10-CM | POA: Insufficient documentation

## 2016-07-18 DIAGNOSIS — E119 Type 2 diabetes mellitus without complications: Secondary | ICD-10-CM | POA: Diagnosis not present

## 2016-07-18 DIAGNOSIS — I10 Essential (primary) hypertension: Secondary | ICD-10-CM | POA: Insufficient documentation

## 2016-07-18 DIAGNOSIS — C50911 Malignant neoplasm of unspecified site of right female breast: Secondary | ICD-10-CM | POA: Diagnosis not present

## 2016-07-18 DIAGNOSIS — E039 Hypothyroidism, unspecified: Secondary | ICD-10-CM | POA: Insufficient documentation

## 2016-07-18 DIAGNOSIS — Z7984 Long term (current) use of oral hypoglycemic drugs: Secondary | ICD-10-CM | POA: Insufficient documentation

## 2016-07-18 DIAGNOSIS — N6310 Unspecified lump in the right breast, unspecified quadrant: Secondary | ICD-10-CM

## 2016-07-18 HISTORY — PX: BREAST LUMPECTOMY: SHX2

## 2016-07-18 HISTORY — PX: MASTECTOMY, PARTIAL: SHX709

## 2016-07-18 LAB — GLUCOSE, CAPILLARY
GLUCOSE-CAPILLARY: 131 mg/dL — AB (ref 65–99)
Glucose-Capillary: 163 mg/dL — ABNORMAL HIGH (ref 65–99)

## 2016-07-18 SURGERY — MASTECTOMY PARTIAL
Anesthesia: General | Laterality: Right | Wound class: Clean

## 2016-07-18 MED ORDER — BUPIVACAINE-EPINEPHRINE (PF) 0.5% -1:200000 IJ SOLN
INTRAMUSCULAR | Status: AC
  Filled 2016-07-18: qty 30

## 2016-07-18 MED ORDER — BUPIVACAINE-EPINEPHRINE (PF) 0.5% -1:200000 IJ SOLN
INTRAMUSCULAR | Status: DC | PRN
  Administered 2016-07-18: 30 mL

## 2016-07-18 MED ORDER — FENTANYL CITRATE (PF) 100 MCG/2ML IJ SOLN
INTRAMUSCULAR | Status: DC | PRN
  Administered 2016-07-18 (×2): 50 ug via INTRAVENOUS
  Administered 2016-07-18: 25 ug via INTRAVENOUS

## 2016-07-18 MED ORDER — FENTANYL CITRATE (PF) 100 MCG/2ML IJ SOLN
INTRAMUSCULAR | Status: AC
  Filled 2016-07-18: qty 2

## 2016-07-18 MED ORDER — PHENYLEPHRINE HCL 10 MG/ML IJ SOLN
INTRAMUSCULAR | Status: AC
Start: 2016-07-18 — End: 2016-07-18
  Filled 2016-07-18: qty 1

## 2016-07-18 MED ORDER — SODIUM CHLORIDE 0.9 % IV SOLN
INTRAVENOUS | Status: DC
  Administered 2016-07-18: 06:00:00 via INTRAVENOUS

## 2016-07-18 MED ORDER — ACETAMINOPHEN 10 MG/ML IV SOLN
INTRAVENOUS | Status: DC | PRN
  Administered 2016-07-18: 1000 mg via INTRAVENOUS

## 2016-07-18 MED ORDER — ONDANSETRON HCL 4 MG/2ML IJ SOLN
INTRAMUSCULAR | Status: DC | PRN
  Administered 2016-07-18: 4 mg via INTRAVENOUS

## 2016-07-18 MED ORDER — PROPOFOL 10 MG/ML IV BOLUS
INTRAVENOUS | Status: AC
  Filled 2016-07-18: qty 20

## 2016-07-18 MED ORDER — SODIUM CHLORIDE 0.9 % IV SOLN
INTRAVENOUS | Status: DC | PRN
  Administered 2016-07-18: 100 ug/min via INTRAVENOUS

## 2016-07-18 MED ORDER — PHENYLEPHRINE HCL 10 MG/ML IJ SOLN
INTRAMUSCULAR | Status: DC | PRN
  Administered 2016-07-18 (×3): 100 ug via INTRAVENOUS
  Administered 2016-07-18: 50 ug via INTRAVENOUS

## 2016-07-18 MED ORDER — ACETAMINOPHEN 10 MG/ML IV SOLN
INTRAVENOUS | Status: AC
  Filled 2016-07-18: qty 100

## 2016-07-18 MED ORDER — ONDANSETRON HCL 4 MG/2ML IJ SOLN
INTRAMUSCULAR | Status: AC
  Filled 2016-07-18: qty 2

## 2016-07-18 MED ORDER — GLYCOPYRROLATE 0.2 MG/ML IJ SOLN
INTRAMUSCULAR | Status: DC | PRN
  Administered 2016-07-18: 0.1 mg via INTRAVENOUS

## 2016-07-18 MED ORDER — ONDANSETRON HCL 4 MG/2ML IJ SOLN: 4.0000 mg | Freq: Once | INTRAMUSCULAR | Status: DC | PRN

## 2016-07-18 MED ORDER — FAMOTIDINE 20 MG PO TABS
ORAL_TABLET | ORAL | Status: AC
  Administered 2016-07-18: 20 mg via ORAL
  Filled 2016-07-18: qty 1

## 2016-07-18 MED ORDER — TRAMADOL HCL 50 MG PO TABS: 50.0000 mg | ORAL_TABLET | ORAL | 0 refills | Status: DC | PRN

## 2016-07-18 MED ORDER — FENTANYL CITRATE (PF) 100 MCG/2ML IJ SOLN: 25.0000 ug | INTRAMUSCULAR | Status: DC | PRN

## 2016-07-18 MED ORDER — PROPOFOL 10 MG/ML IV BOLUS
INTRAVENOUS | Status: DC | PRN
  Administered 2016-07-18: 200 mg via INTRAVENOUS

## 2016-07-18 MED ORDER — GLIPIZIDE ER 5 MG PO TB24: ORAL_TABLET | ORAL | 3 refills | Status: DC

## 2016-07-18 SURGICAL SUPPLY — 53 items
BANDAGE ELASTIC 6 LF NS (GAUZE/BANDAGES/DRESSINGS) ×1 IMPLANT
BLADE SURG 15 STRL SS SAFETY (BLADE) ×2 IMPLANT
BNDG CMPR MED 5X6 ELC HKLP NS (GAUZE/BANDAGES/DRESSINGS)
BNDG GAUZE 4.5X4.1 6PLY STRL (MISCELLANEOUS) ×3 IMPLANT
BRA SURGICAL 2XLRG (MISCELLANEOUS) ×2 IMPLANT
BULB RESERV EVAC DRAIN JP 100C (MISCELLANEOUS) IMPLANT
CANISTER SUCT 1200ML W/VALVE (MISCELLANEOUS) ×3 IMPLANT
CHLORAPREP W/TINT 26ML (MISCELLANEOUS) ×3 IMPLANT
CLOSURE WOUND 1/2 X4 (GAUZE/BANDAGES/DRESSINGS) ×1
CNTNR SPEC 2.5X3XGRAD LEK (MISCELLANEOUS)
CONT SPEC 4OZ STER OR WHT (MISCELLANEOUS)
CONT SPEC 4OZ STRL OR WHT (MISCELLANEOUS)
CONTAINER SPEC 2.5X3XGRAD LEK (MISCELLANEOUS) ×1 IMPLANT
COVER PROBE FLX POLY STRL (MISCELLANEOUS) ×3 IMPLANT
DRAIN CHANNEL JP 15F RND 16 (MISCELLANEOUS) IMPLANT
DRAPE LAPAROTOMY TRNSV 106X77 (MISCELLANEOUS) ×3 IMPLANT
DRSG TELFA 3X8 NADH (GAUZE/BANDAGES/DRESSINGS) ×3 IMPLANT
ELECT CAUTERY BLADE TIP 2.5 (TIP) ×3
ELECT REM PT RETURN 9FT ADLT (ELECTROSURGICAL) ×3
ELECTRODE CAUTERY BLDE TIP 2.5 (TIP) ×1 IMPLANT
ELECTRODE REM PT RTRN 9FT ADLT (ELECTROSURGICAL) ×1 IMPLANT
GAUZE FLUFF 18X24 1PLY STRL (GAUZE/BANDAGES/DRESSINGS) ×3 IMPLANT
GAUZE SPONGE 4X4 12PLY STRL (GAUZE/BANDAGES/DRESSINGS) ×1 IMPLANT
GLOVE BIO SURGEON STRL SZ7.5 (GLOVE) ×7 IMPLANT
GLOVE INDICATOR 8.0 STRL GRN (GLOVE) ×3 IMPLANT
GOWN STRL REUS W/ TWL LRG LVL3 (GOWN DISPOSABLE) ×2 IMPLANT
GOWN STRL REUS W/TWL LRG LVL3 (GOWN DISPOSABLE) ×6
HARMONIC SCALPEL FOCUS (MISCELLANEOUS) ×1 IMPLANT
KIT RM TURNOVER STRD PROC AR (KITS) ×3 IMPLANT
LABEL OR SOLS (LABEL) ×3 IMPLANT
MARGIN MAP 10MM (MISCELLANEOUS) ×3 IMPLANT
NDL HYPO 25X1 1.5 SAFETY (NEEDLE) ×2 IMPLANT
NDL SAFETY 22GX1.5 (NEEDLE) ×3 IMPLANT
NEEDLE HYPO 25X1 1.5 SAFETY (NEEDLE) ×6 IMPLANT
PACK BASIN MINOR ARMC (MISCELLANEOUS) ×3 IMPLANT
PAD DRESSING TELFA 3X8 NADH (GAUZE/BANDAGES/DRESSINGS) ×1 IMPLANT
SHEARS FOC LG CVD HARMONIC 17C (MISCELLANEOUS) IMPLANT
STRIP CLOSURE SKIN 1/2X4 (GAUZE/BANDAGES/DRESSINGS) ×2 IMPLANT
SURGI-BRA 2X (MISCELLANEOUS) IMPLANT
SUT ETHILON 3-0 FS-10 30 BLK (SUTURE) ×3
SUT SILK 2 0 (SUTURE) ×3
SUT SILK 2-0 18XBRD TIE 12 (SUTURE) ×1 IMPLANT
SUT VIC AB 2-0 CT1 27 (SUTURE) ×6
SUT VIC AB 2-0 CT1 TAPERPNT 27 (SUTURE) ×2 IMPLANT
SUT VIC AB 4-0 FS2 27 (SUTURE) ×3 IMPLANT
SUT VICRYL+ 3-0 144IN (SUTURE) ×3 IMPLANT
SUTURE EHLN 3-0 FS-10 30 BLK (SUTURE) ×1 IMPLANT
SWABSTK COMLB BENZOIN TINCTURE (MISCELLANEOUS) ×1 IMPLANT
SYR BULB IRRIG 60ML STRL (SYRINGE) ×3 IMPLANT
SYR CONTROL 10ML (SYRINGE) ×3 IMPLANT
SYRINGE 10CC LL (SYRINGE) ×3 IMPLANT
TAPE TRANSPORE STRL 2 31045 (GAUZE/BANDAGES/DRESSINGS) ×3 IMPLANT
WATER STERILE IRR 1000ML POUR (IV SOLUTION) ×3 IMPLANT

## 2016-07-18 NOTE — Anesthesia Preprocedure Evaluation (Signed)
Anesthesia Evaluation  Patient identified by MRN, date of birth, ID band Patient awake    Reviewed: Allergy & Precautions, NPO status , Patient's Chart, lab work & pertinent test results  Airway Mallampati: III  TM Distance: >3 FB     Dental  (+) Teeth Intact   Pulmonary neg pulmonary ROS,    breath sounds clear to auscultation       Cardiovascular Exercise Tolerance: Good hypertension, Pt. on medications and Pt. on home beta blockers  Rhythm:Regular Rate:Normal     Neuro/Psych negative neurological ROS     GI/Hepatic negative GI ROS, Neg liver ROS,   Endo/Other  diabetes, Type 2, Oral Hypoglycemic AgentsHypothyroidism Morbid obesity  Renal/GU negative Renal ROS     Musculoskeletal   Abdominal (+) + obese,   Peds  Hematology  (+) anemia ,   Anesthesia Other Findings   Reproductive/Obstetrics                             Anesthesia Physical Anesthesia Plan  ASA: III  Anesthesia Plan: General   Post-op Pain Management:    Induction: Intravenous  Airway Management Planned: LMA  Additional Equipment:   Intra-op Plan:   Post-operative Plan: Extubation in OR  Informed Consent: I have reviewed the patients History and Physical, chart, labs and discussed the procedure including the risks, benefits and alternatives for the proposed anesthesia with the patient or authorized representative who has indicated his/her understanding and acceptance.     Plan Discussed with: CRNA  Anesthesia Plan Comments:         Anesthesia Quick Evaluation

## 2016-07-18 NOTE — H&P (Signed)
No change in clinical history or exam.  For right breast partial mastectomy, possible nipple/ areola removal.

## 2016-07-18 NOTE — OR Nursing (Signed)
Dr. Bary Castilla into see pt, ok to dc home when pt is ready.

## 2016-07-18 NOTE — Op Note (Signed)
Preoperative diagnosis: High-grade DCIS right breast.  Postoperative diagnosis: Same.  Procedure: Wide excision right breast with ultrasound guidance.  Attending surgeon: Ollen Bowl, M.D.  Anesthesia: Gen. by LMA, Marcaine 0.5% with 1-200,000 epinephrine, 30 mL.  Estimated blood loss: 5 mL.  Clinical note: This 71 year old woman had an abnormal mammogram with biopsy showing evidence of high-grade DCIS. She is admitted at this time for planned wide excision. Possibility of loss of the nipple areolar complex was reviewed prior to procedure.  Operative note: With the patient under adequate general anesthesia the area was prepped with ChloraPrep and draped. Ultrasound was used to confirm location of the biopsy site at the 4:00 position just 1 cm outside the edge of the nipple. Local anesthesia was infiltrated for postoperative analgesia. A periareolar incision from the 3-9:00 position was made and carried aphthous consulting this tissue with hemostasis achieved by electrocautery. The nipple areolar complex was lifted off the underlying breast parenchyma. A skin marker was placed at the area where the ductal structures came to the base the nipple for identification. A 6 cm diameter 3 cm thick section of tissue was resected. Postresection ultrasound confirmed the previously placed biopsy clip. This was again confirmed by mammography. The specimen was sent fresh to pathology for review showing a estimated 5 mm spacing between the biopsy cavity and the superficial aspect of the specimen.  The breast tissue was closed in multiple layers to reconstruct the volume behind the nipple areolar complex with interrupted 2-0 Vicryl figure-of-eight sutures in 2 layers. Interrupted 3-0 Vicryl sutures were used to approximate the subcutaneous fat and then a running 4-0 Vicryl subcuticular suture for was used on the skin.  Benzoin, Steri-Strips, Telfa, fluffed gauze and a surgical bra were then placed.  The  patient tolerated the procedure well and was taken recovery room stable condition.

## 2016-07-18 NOTE — Anesthesia Procedure Notes (Signed)
Procedure Name: LMA Insertion Performed by: Lance Muss Pre-anesthesia Checklist: Patient identified, Patient being monitored, Timeout performed, Emergency Drugs available and Suction available Patient Re-evaluated:Patient Re-evaluated prior to inductionOxygen Delivery Method: Circle system utilized Preoxygenation: Pre-oxygenation with 100% oxygen Intubation Type: IV induction Ventilation: Mask ventilation without difficulty LMA: LMA inserted LMA Size: 4.0 Tube type: Oral Number of attempts: 1 Placement Confirmation: positive ETCO2 and breath sounds checked- equal and bilateral Tube secured with: Tape Dental Injury: Teeth and Oropharynx as per pre-operative assessment

## 2016-07-18 NOTE — Anesthesia Post-op Follow-up Note (Cosign Needed)
Anesthesia QCDR form completed.        

## 2016-07-18 NOTE — Transfer of Care (Signed)
Immediate Anesthesia Transfer of Care Note  Patient: Carol Ramsey  Procedure(s) Performed: Procedure(s): MASTECTOMY PARTIAL (Right)  Patient Location: PACU  Anesthesia Type:General  Level of Consciousness: awake and responds to stimulation  Airway & Oxygen Therapy: Patient Spontanous Breathing and Patient connected to face mask oxygen  Post-op Assessment: Report given to RN and Post -op Vital signs reviewed and stable  Post vital signs: Reviewed and stable  Last Vitals:  Vitals:   07/18/16 0614 07/18/16 0843  BP: (!) 150/46 134/64  Pulse: 64 67  Resp: 16   Temp: 36.7 C     Last Pain:  Vitals:   07/18/16 0614  TempSrc: Oral         Complications: No apparent anesthesia complications

## 2016-07-18 NOTE — Anesthesia Postprocedure Evaluation (Signed)
Anesthesia Post Note  Patient: Carol Ramsey  Procedure(s) Performed: Procedure(s) (LRB): MASTECTOMY PARTIAL (Right)  Patient location during evaluation: PACU Anesthesia Type: General Level of consciousness: awake Pain management: pain level controlled Vital Signs Assessment: post-procedure vital signs reviewed and stable Respiratory status: spontaneous breathing Cardiovascular status: blood pressure returned to baseline Anesthetic complications: no     Last Vitals:  Vitals:   07/18/16 0922 07/18/16 0933  BP: (!) 106/58 (!) 161/47  Pulse: (!) 58 (!) 57  Resp: 17 18  Temp: 36.2 C 36.6 C    Last Pain:  Vitals:   07/18/16 0933  TempSrc: Oral                 VAN STAVEREN,Lynnlee Revels

## 2016-07-18 NOTE — Discharge Instructions (Addendum)
AMBULATORY SURGERY  DISCHARGE INSTRUCTIONS   1) The drugs that you were given will stay in your system until tomorrow so for the next 24 hours you should not:  A) Drive an automobile B) Make any legal decisions C) Drink any alcoholic beverage   2) You may resume regular meals tomorrow.  Today it is better to start with liquids and gradually work up to solid foods.  You may eat anything you prefer, but it is better to start with liquids, then soup and crackers, and gradually work up to solid foods.   3) Please notify your doctor immediately if you have any unusual bleeding, trouble breathing, redness and pain at the surgery site, drainage, fever, or pain not relieved by medication.   4) Additional Instructions: See Dr. Dwyane Luo instruction.

## 2016-07-19 ENCOUNTER — Encounter: Payer: Self-pay | Admitting: General Surgery

## 2016-07-19 NOTE — Progress Notes (Signed)
  Oncology Nurse Navigator Documentation  Navigator Location: [facility] (07/19/16 0900)   )Navigator Encounter Type: Telephone (07/19/16 0900) Telephone: Carol Ramsey Call (07/19/16 0900)     Surgery Date: 07/18/16 (07/19/16 0900)           Treatment Initiated Date: 07/18/16 (07/19/16 0900)   Treatment Phase:  (post surgery phone call) (07/19/16 0900)                            Time Spent with Patient: 15 (07/19/16 0900)  Post surgery phone call to patient.  State she has minimal discomfort, and slept well yesterday,and last night.  Confirmed follow-up appointments.

## 2016-07-21 ENCOUNTER — Ambulatory Visit (INDEPENDENT_AMBULATORY_CARE_PROVIDER_SITE_OTHER): Payer: Medicare Other | Admitting: General Surgery

## 2016-07-21 ENCOUNTER — Encounter: Payer: Self-pay | Admitting: General Surgery

## 2016-07-21 VITALS — BP 148/78 | HR 100 | Resp 14 | Ht 63.0 in | Wt 250.0 lb

## 2016-07-21 DIAGNOSIS — D0511 Intraductal carcinoma in situ of right breast: Secondary | ICD-10-CM

## 2016-07-21 DIAGNOSIS — N183 Chronic kidney disease, stage 3 unspecified: Secondary | ICD-10-CM | POA: Insufficient documentation

## 2016-07-21 DIAGNOSIS — I10 Essential (primary) hypertension: Secondary | ICD-10-CM | POA: Diagnosis not present

## 2016-07-21 DIAGNOSIS — I493 Ventricular premature depolarization: Secondary | ICD-10-CM | POA: Diagnosis not present

## 2016-07-21 DIAGNOSIS — E782 Mixed hyperlipidemia: Secondary | ICD-10-CM | POA: Diagnosis not present

## 2016-07-21 NOTE — Patient Instructions (Addendum)
The patient is aware to call back for any questions or concerns. Follow up appointment to be announced.

## 2016-07-21 NOTE — Progress Notes (Signed)
Patient ID: Carol Ramsey, female   DOB: 07/25/1945, 71 y.o.   MRN: [id number]  Chief Complaint  Patient presents with  . Routine Post Op    right breast lumpectomy    HPI Carol Ramsey is a 71 y.o. female here today for her post op right breast lumpectomy done on 07/18/2016. Patient states she is doing well. Son, Carol Ramsey present at visit.  HPI  Past Medical History:  Diagnosis Date  . Anemia   . Arthritis   . Breast cancer ([place]) 06/28/2016   right breast DCIS grade 3  . 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 Diabetes mellitus without complication ([place])   . Hyperlipidemia   . Hypertension   . Hyperthyroidism   . Obesity   . Thyroid disease    hyperthyroidism  . Vitamin D deficiency     Past Surgical History:  Procedure Laterality Date  . BREAST BIOPSY Right 06/28/2016   stereotactic biopsy - DUCTAL CARCINOMA IN SITU (DCIS), HIGH NUCLEAR GRADE   . JOINT REPLACEMENT Left    tkr  . KNEE SURGERY Left    arthroscopy  . MASTECTOMY, PARTIAL Right 07/18/2016   Procedure: MASTECTOMY PARTIAL;  Surgeon: Robert Bellow, MD;  Location: [facility] ORS;  Service: General;  Laterality: Right;  . SKIN CANCER EXCISION     face    Family History  Problem Relation Age of Onset  . Hypertension Mother   . Cervical cancer Mother   . Heart disease Father   . Hypertension Father   . Lung cancer Father   . Lung cancer Sister   . Brain cancer Sister   . Diabetes Brother   . Heart disease Brother   . Hypertension Brother   . Stroke Brother     Social History Social History  Substance Use Topics  . Smoking status: Never Smoker  . Smokeless tobacco: Never Used  . Alcohol use No    Allergies  Allergen Reactions  . Gabapentin Swelling  . Quinine Nausea Only and Nausea And Vomiting  . Amoxicillin Itching and Rash    Has patient had a PCN reaction causing immediate rash, facial/tongue/throat swelling, SOB or lightheadedness with hypotension: No Has patient had a PCN reaction causing  severe rash involving mucus membranes or skin necrosis: No Has patient had a PCN reaction that required hospitalization No Has patient had a PCN reaction occurring within the last 10 years: No If all of the above answers are "NO", then may proceed with Cephalosporin use.   . Dilaudid  [Hydromorphone Hcl] Rash  . Etodolac Rash  . Hydrochlorothiazide Rash  . Hydrocodone Rash  . Hydromorphone Rash  . Sulfa Antibiotics Rash    Current Outpatient Prescriptions  Medication Sig Dispense Refill  . acetaminophen (TYLENOL) 500 MG tablet Take 500 mg by mouth every 6 (six) hours as needed for mild pain or moderate pain.    [name] amLODipine (NORVASC) 5 MG tablet TAKE 1 TABLET BY MOUTH EVERY DAY 90 tablet 3  . aspirin 81 MG tablet Take 81 mg by mouth daily. Hasn't taken in 1 week    . atorvastatin (LIPITOR) 10 MG tablet Take 10 mg by mouth daily at 6 PM.     . Cholecalciferol (VITAMIN D) 2000 units CAPS Take 2,000 Units by mouth daily.     [name] glipiZIDE (GLUCOTROL XL) 5 MG 24 hr tablet TAKE 1 TABLETS BY MOUTH DAILY 90 tablet 3  . glucose blood (ONETOUCH VERIO) test strip Use as  instructed; Check blood sugar twice daily (Patient taking differently: Use as instructed; Check blood sugar twice daily) 100 each 12  . [name] 50-1000 MG tablet TAKE 1 TABLET BY MOUTH TWICE DAILY WITH A MEAL 60 tablet 5  . lisinopril-hydrochlorothiazide (PRINZIDE,ZESTORETIC) 20-12.5 MG tablet TAKE 2 TABLETS BY MOUTH EVERY MORNING 60 tablet 11  . meloxicam (MOBIC) 15 MG tablet Take 15 mg by mouth daily.     . metoprolol succinate (TOPROL-XL) 25 MG 24 hr tablet Take 1 tablet (25 mg total) by mouth daily. 30 tablet 11  . pioglitazone (ACTOS) 45 MG tablet TAKE 1 TABLET(45 MG) BY MOUTH DAILY 30 tablet 12   No current facility-administered medications for this visit.     Review of Systems Review of Systems  Constitutional: Negative.   Respiratory: Negative.   Cardiovascular: Negative.     Blood pressure (!) 148/78, pulse 100,  resp. rate 14, height 5\' 3"  (1.6 m), weight 250 lb (113.4 kg).  Physical Exam Physical Exam  Constitutional: She is oriented to person, place, and time. She appears well-developed and well-nourished.  HENT:  Mouth/Throat: Oropharynx is clear and moist.  Eyes: Conjunctivae are normal. No scleral icterus.  Neck: Neck supple.  Cardiovascular: Normal rate, regular rhythm and normal heart sounds.   Pulmonary/Chest: Effort normal and breath sounds normal.  Right breast incision   Lymphadenopathy:    She has no cervical adenopathy.  Neurological: She is alert and oriented to person, place, and time.  Skin: Skin is warm and dry.  Psychiatric: Her behavior is normal.    Data Reviewed DIAGNOSIS:  A. RIGHT BREAST; WIDE EXCISION:  - DUCTAL CARCINOMA IN SITU, NUCLEAR GRADE 3 WITH COMEDO NECROSIS.  - THE SURGICAL MARGINS ARE CLOSE BUT NEGATIVE (FOCALLY <0.5 MM TO  POSTERIOR).  - BIOPSY SITE CHANGES, MARKER CLIP PRESENT.   Surgical Pathology Cancer Case Summary   DUCTAL CARCINOMA IN SITU OF THE BREAST:  Procedure: Wide excision  Specimen Laterality: Right  Size (Extent) of DCIS: at least 20 mm  Histologic Type: Ductal carcinoma in situ (DCIS)  Nuclear Grade: 3  Necrosis: Comedo necrosis  Margins: Not involved, closest margin is posterior, <0.5 mm  Regional Lymph nodes: Lymph nodes not submitted or found  Pathologic Stage Classification (pTNM, AJCC 8th Edition): pTis pNx   Assessment    High-grade DCIS, good preservation of the nipple areolar complex, thin posterior margin.    Plan    Recommendations for a 2 mm margin was discussed with the patient and her son with appropriate visual illustration. Options for management at this time include 1) return to the operating room for reexcision of the deeper margin versus 2 proceed directly to whole breast radiation. The likelihood of additional malignancy is a problem 1 and 3. He had additional malignancy was left in situ her chance of  recurrent DCIS/invasive cancer would likely rise from 6-12% assuming a hormone negative tumor or 3-6% for a hormone Positive tumor.  Once again the ER/PR studies, the patient will be contacted and will make a treatment plan from that point.       Follow up appointment to be announced.   This information has been scribed by Gaspar Cola CMA.   Robert Bellow 07/21/2016, 9:25 PM

## 2016-07-22 LAB — SURGICAL PATHOLOGY

## 2016-07-25 ENCOUNTER — Ambulatory Visit: Payer: Medicare Other | Admitting: General Surgery

## 2016-07-26 NOTE — Progress Notes (Signed)
  Oncology Nurse Navigator Documentation  Navigator Location: [facility] (07/26/16 1400)   )Navigator Encounter Type: Telephone (07/26/16 1400) Telephone: Lahoma Crocker Call;Incoming Call;Appt Confirmation/Clarification;Diagnostic Results (07/26/16 1400)                   Patient Visit Type: Follow-up (07/26/16 1400) Treatment Phase: Active Tx (07/26/16 1400) Barriers/Navigation Needs: Coordination of Care;Family concerns (07/26/16 1400)   Interventions: Coordination of Care (07/26/16 1400)   Coordination of Care: Appts (07/26/16 1400)                  Time Spent with Patient: 30 (07/26/16 1400)   Patient ,and family have concerns about treatment options related to surgical margins.  Requested appointment with Dr.Finnegan be moved up from 08/01/16.  Scheduled patient to see Dr. Grayland Ormond 07/27/16 at 2:45.

## 2016-07-26 NOTE — Progress Notes (Signed)
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 She tolerated her lumpectomy well without significant side effects. She currently feels well and is asymptomatic. She has no neurologic complaints. She denies any recent fevers or illnesses. She has a good appetite and denies weight loss. She has no chest pain or shortness of breath. She denies any nausea, vomiting, constipation, or diarrhea. She has no urinary complaints. Patient offers no specific complaints today.  REVIEW OF SYSTEMS:   Review of Systems  Constitutional: Negative.  Negative for fever, malaise/fatigue and weight loss.  Respiratory: Negative.  Negative for cough and shortness of breath.   Cardiovascular: Negative.  Negative for chest pain and leg swelling.  Gastrointestinal: Negative.  Negative for abdominal pain.  Genitourinary: Negative.   Musculoskeletal: Negative.   Neurological: Negative.  Negative for weakness.  Psychiatric/Behavioral: Negative.  The patient is not nervous/anxious.     As per HPI. Otherwise, a complete review of systems is negative.  PAST MEDICAL HISTORY: Past Medical History:  Diagnosis Date  . Anemia   . Arthritis   . Breast cancer ([name]) 06/28/2016   right breast DCIS grade 3  . 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 Diabetes mellitus without complication ([name])   . Hyperlipidemia   .  Hypertension   . Hyperthyroidism   . Obesity   . Thyroid disease    hyperthyroidism  . Vitamin D deficiency     PAST SURGICAL HISTORY: Past Surgical History:  Procedure Laterality Date  . BREAST BIOPSY Right 06/28/2016   stereotactic biopsy - DUCTAL CARCINOMA IN SITU (DCIS), HIGH NUCLEAR GRADE   . JOINT REPLACEMENT Left    tkr  . KNEE SURGERY Left    arthroscopy  . MASTECTOMY, PARTIAL Right 07/18/2016   Procedure: MASTECTOMY PARTIAL;  Surgeon: Jeffrey W Byrnett, MD;  Location: [facility] ORS;  Service: General;  Laterality: Right;  . SKIN CANCER EXCISION     face    FAMILY HISTORY: Family History  Problem Relation Age of Onset  . Hypertension Mother   . Cervical cancer Mother   . Heart disease Father   . Hypertension Father   . Lung cancer Father   . Lung cancer Sister   . Brain cancer Sister   . Diabetes Brother   . Heart disease Brother   . Hypertension Brother   . Stroke Brother     ADVANCED DIRECTIVES (Y/N):  N  HEALTH MAINTENANCE: Social History  Substance Use Topics  . Smoking status: Never Smoker  . Smokeless tobacco: Never Used  . Alcohol use No     Colonoscopy:  PAP:  Bone density:  Lipid panel:  Allergies  Allergen Reactions  . Gabapentin Swelling  . Quinine Nausea Only and Nausea And Vomiting  . Amoxicillin Itching and Rash    Has patient had a PCN reaction causing immediate rash, facial/tongue/throat swelling, SOB or lightheadedness with hypotension: No Has patient had a PCN reaction causing severe rash involving mucus membranes   or skin necrosis: No Has patient had a PCN reaction that required hospitalization No Has patient had a PCN reaction occurring within the last 10 years: No If all of the above answers are "NO", then may proceed with Cephalosporin use.   . Dilaudid  [Hydromorphone Hcl] Rash  . Etodolac Rash  . Hydrochlorothiazide Rash  . Hydrocodone Rash  . Hydromorphone Rash  . Sulfa Antibiotics Rash    Current Outpatient  Prescriptions  Medication Sig Dispense Refill  . acetaminophen (TYLENOL) 500 MG tablet Take 500 mg by mouth every 6 (six) hours as needed for mild pain or moderate pain.    . amLODipine (NORVASC) 5 MG tablet TAKE 1 TABLET BY MOUTH EVERY DAY 90 tablet 3  . aspirin 81 MG tablet Take 81 mg by mouth daily. Hasn't taken in 1 week    . atorvastatin (LIPITOR) 10 MG tablet Take 10 mg by mouth daily at 6 PM.     . Cholecalciferol (VITAMIN D) 2000 units CAPS Take 2,000 Units by mouth daily.     . glipiZIDE (GLUCOTROL XL) 5 MG 24 hr tablet TAKE 1 TABLETS BY MOUTH DAILY 90 tablet 3  . glucose blood (ONETOUCH VERIO) test strip Use as instructed; Check blood sugar twice daily (Patient taking differently: Use as instructed; Check blood sugar twice daily) 100 each 12  . [name] 50-1000 MG tablet TAKE 1 TABLET BY MOUTH TWICE DAILY WITH A MEAL 60 tablet 5  . lisinopril-hydrochlorothiazide (PRINZIDE,ZESTORETIC) 20-12.5 MG tablet TAKE 2 TABLETS BY MOUTH EVERY MORNING 60 tablet 11  . meloxicam (MOBIC) 15 MG tablet Take 15 mg by mouth daily.     . metoprolol succinate (TOPROL-XL) 25 MG 24 hr tablet Take 1 tablet (25 mg total) by mouth daily. 30 tablet 11  . pioglitazone (ACTOS) 45 MG tablet TAKE 1 TABLET(45 MG) BY MOUTH DAILY 30 tablet 12   No current facility-administered medications for this visit.     OBJECTIVE: Vitals:   07/28/16 1456  BP: 136/71  Pulse: 69     Body mass index is 44.29 kg/m.    ECOG FS:0 - Asymptomatic  General: Well-developed, well-nourished, no acute distress. Eyes: Pink conjunctiva, anicteric sclera. Breasts: Patient declined breast exam today. Lungs: Clear to auscultation bilaterally. Heart: Regular rate and rhythm. No rubs, murmurs, or gallops. Abdomen: Soft, nontender, nondistended. No organomegaly noted, normoactive bowel sounds. Musculoskeletal: No edema, cyanosis, or clubbing. Neuro: Alert, answering all questions appropriately. Cranial nerves grossly intact. Skin: No rashes  or petechiae noted. Psych: Normal affect.   LAB RESULTS:  Lab Results  Component Value Date   NA 140 07/11/2016   K 3.8 07/11/2016   CL 106 07/11/2016   CO2 25 07/11/2016   GLUCOSE 155 (H) 07/11/2016   BUN 26 (H) 07/11/2016   CREATININE 1.14 (H) 07/11/2016   CALCIUM 9.4 07/11/2016   PROT 7.1 03/03/2016   ALBUMIN 4.2 03/03/2016   AST 15 03/03/2016   ALT 7 03/03/2016   ALKPHOS 65 03/03/2016   BILITOT 0.5 03/03/2016   GFRNONAA 48 (L) 07/11/2016   GFRAA 56 (L) 07/11/2016    Lab Results  Component Value Date   WBC 5.8 07/11/2016   NEUTROABS 3.2 10/05/2015   HGB 11.3 (L) 07/11/2016   HCT 34.5 (L) 07/11/2016   MCV 83.7 07/11/2016   PLT 245 07/11/2016     STUDIES: No results found.  ASSESSMENT: Right breast high grade DCIS.  PLAN:    1. Right breast high grade DCIS: Final pathology was reviewed independently as   also discussed at breast case conference. Patient has close margins less than 0.5 mm. Patient was given the option for reexcision to obtain better margins or proceed directly with XRT. She had an appointment with radiation oncology today as well and patient likely is going to decline repeat surgery and continue with adjuvant XRT. Patient will require tamoxifen for total 5 years. Return to clinic in 3 months approximately at the end of her XRT for further evaluation and initiation of tamoxifen.   Approximately 30 minutes was spent in discussion of which greater than 50% was consultation.  Patient expressed understanding and was in agreement with this plan. She also understands that She can call clinic at any time with any questions, concerns, or complaints.   Cancer Staging Ductal carcinoma in situ (DCIS) of right breast Staging form: Breast, AJCC 8th Edition - Pathologic stage from 08/02/2016: Stage 0 (pTis (DCIS), pN0, cM0, ER: Positive, PR: Positive, HER2: Negative) - Signed by Timothy J Finnegan, MD on 08/02/2016   Timothy J Finnegan, MD   08/02/2016 10:07  PM     

## 2016-07-28 ENCOUNTER — Encounter: Payer: Self-pay | Admitting: Oncology

## 2016-07-28 ENCOUNTER — Inpatient Hospital Stay (HOSPITAL_BASED_OUTPATIENT_CLINIC_OR_DEPARTMENT_OTHER): Payer: Medicare Other | Admitting: Oncology

## 2016-07-28 VITALS — BP 136/71 | HR 69 | Wt 250.0 lb

## 2016-07-28 DIAGNOSIS — E785 Hyperlipidemia, unspecified: Secondary | ICD-10-CM | POA: Diagnosis not present

## 2016-07-28 DIAGNOSIS — I10 Essential (primary) hypertension: Secondary | ICD-10-CM

## 2016-07-28 DIAGNOSIS — E669 Obesity, unspecified: Secondary | ICD-10-CM

## 2016-07-28 DIAGNOSIS — E039 Hypothyroidism, unspecified: Secondary | ICD-10-CM | POA: Diagnosis not present

## 2016-07-28 DIAGNOSIS — Z809 Family history of malignant neoplasm, unspecified: Secondary | ICD-10-CM

## 2016-07-28 DIAGNOSIS — E559 Vitamin D deficiency, unspecified: Secondary | ICD-10-CM | POA: Diagnosis not present

## 2016-07-28 DIAGNOSIS — Z7984 Long term (current) use of oral hypoglycemic drugs: Secondary | ICD-10-CM | POA: Diagnosis not present

## 2016-07-28 DIAGNOSIS — E119 Type 2 diabetes mellitus without complications: Secondary | ICD-10-CM | POA: Diagnosis not present

## 2016-07-28 DIAGNOSIS — Z79899 Other long term (current) drug therapy: Secondary | ICD-10-CM

## 2016-07-28 DIAGNOSIS — Z85828 Personal history of other malignant neoplasm of skin: Secondary | ICD-10-CM

## 2016-07-28 DIAGNOSIS — D0511 Intraductal carcinoma in situ of right breast: Secondary | ICD-10-CM | POA: Diagnosis not present

## 2016-07-28 DIAGNOSIS — Z801 Family history of malignant neoplasm of trachea, bronchus and lung: Secondary | ICD-10-CM | POA: Diagnosis not present

## 2016-08-01 ENCOUNTER — Encounter: Payer: Self-pay | Admitting: Radiation Oncology

## 2016-08-01 ENCOUNTER — Ambulatory Visit
Admission: RE | Admit: 2016-08-01 | Discharge: 2016-08-01 | Disposition: A | Discharge status: Home / Self Care | Payer: Medicare Other | Source: Ambulatory Visit | Attending: Radiation Oncology | Admitting: Radiation Oncology

## 2016-08-01 ENCOUNTER — Inpatient Hospital Stay: Payer: Medicare Other | Admitting: Oncology

## 2016-08-01 VITALS — BP 137/67 | HR 67 | Temp 97.2°F | Resp 18 | Wt 253.0 lb

## 2016-08-01 DIAGNOSIS — E559 Vitamin D deficiency, unspecified: Secondary | ICD-10-CM | POA: Diagnosis not present

## 2016-08-01 DIAGNOSIS — M25561 Pain in right knee: Secondary | ICD-10-CM | POA: Insufficient documentation

## 2016-08-01 DIAGNOSIS — Z510 Encounter for antineoplastic radiation therapy: Secondary | ICD-10-CM | POA: Insufficient documentation

## 2016-08-01 DIAGNOSIS — E785 Hyperlipidemia, unspecified: Secondary | ICD-10-CM | POA: Insufficient documentation

## 2016-08-01 DIAGNOSIS — D649 Anemia, unspecified: Secondary | ICD-10-CM | POA: Diagnosis not present

## 2016-08-01 DIAGNOSIS — Z7984 Long term (current) use of oral hypoglycemic drugs: Secondary | ICD-10-CM | POA: Insufficient documentation

## 2016-08-01 DIAGNOSIS — D0511 Intraductal carcinoma in situ of right breast: Secondary | ICD-10-CM

## 2016-08-01 DIAGNOSIS — Z9012 Acquired absence of left breast and nipple: Secondary | ICD-10-CM | POA: Diagnosis not present

## 2016-08-01 DIAGNOSIS — Z170 Estrogen receptor positive status [ER+]: Secondary | ICD-10-CM | POA: Diagnosis not present

## 2016-08-01 DIAGNOSIS — I10 Essential (primary) hypertension: Secondary | ICD-10-CM | POA: Diagnosis not present

## 2016-08-01 DIAGNOSIS — M25562 Pain in left knee: Secondary | ICD-10-CM | POA: Insufficient documentation

## 2016-08-01 DIAGNOSIS — Z808 Family history of malignant neoplasm of other organs or systems: Secondary | ICD-10-CM | POA: Insufficient documentation

## 2016-08-01 DIAGNOSIS — E119 Type 2 diabetes mellitus without complications: Secondary | ICD-10-CM | POA: Diagnosis not present

## 2016-08-01 DIAGNOSIS — D0512 Intraductal carcinoma in situ of left breast: Secondary | ICD-10-CM | POA: Diagnosis not present

## 2016-08-01 DIAGNOSIS — E0590 Thyrotoxicosis, unspecified without thyrotoxic crisis or storm: Secondary | ICD-10-CM | POA: Diagnosis not present

## 2016-08-01 DIAGNOSIS — E669 Obesity, unspecified: Secondary | ICD-10-CM | POA: Insufficient documentation

## 2016-08-01 DIAGNOSIS — Z79899 Other long term (current) drug therapy: Secondary | ICD-10-CM | POA: Insufficient documentation

## 2016-08-01 NOTE — Consult Note (Signed)
NEW PATIENT EVALUATION  Name: Carol Ramsey  MRN: [id number]  Date:   08/01/2016     DOB: 03-14-1946   This 71 y.o. 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  REFERRING PHYSICIAN: Jerrol Banana.,*  CHIEF COMPLAINT:  Chief Complaint  Patient presents with  . Breast Cancer    Initial Evaluation for treatment    DIAGNOSIS: The encounter diagnosis was Ductal carcinoma in situ (DCIS) of right breast.   PREVIOUS INVESTIGATIONS:  Mammograms and ultrasound reviewed Pathology reports reviewed Clinical notes reviewed Case presented at breast tumor board  HPI: Patient is a 71 year old morbidly obese female who presented with an abnormal mammogram of the right breast showing indeterminate group of calcifications the subgaleal region for which biopsy was recommended. Biopsy was positive for ER/PR positive ductal carcinoma in situ high-grade. She underwent a wide local excision showing nuclear grade 3 ductal carcinoma in situ with comedonecrosis measuring approximately 2 cm. Posterior margin was close at 0.5 mm. Again tumor is ER/PR positive. She has done well postoperatively has see medical oncology for which tamoxifen has been recommended. She is now referred to radiation oncology for consideration of treatment. She has extremely poor knees making ambulation difficult and she is morbidly obese. She is healing well specifically denies breast tenderness cough or bone pain.  PLANNED TREATMENT REGIMEN: Right whole breast radiation  PAST MEDICAL HISTORY:  has a past medical history of Anemia; Arthritis; Breast cancer ([place]) (06/28/2016); Cancer ([place]); Diabetes mellitus without complication ([place]); Hyperlipidemia; Hypertension; Hyperthyroidism; Obesity; Thyroid disease; and Vitamin D deficiency.    PAST SURGICAL HISTORY:  Past Surgical History:  Procedure Laterality Date  .  BREAST BIOPSY Right 06/28/2016   stereotactic biopsy - DUCTAL CARCINOMA IN SITU (DCIS), HIGH NUCLEAR GRADE   . JOINT REPLACEMENT Left    tkr  . KNEE SURGERY Left    arthroscopy  . MASTECTOMY, PARTIAL Right 07/18/2016   Procedure: MASTECTOMY PARTIAL;  Surgeon: Robert Bellow, MD;  Location: [facility] ORS;  Service: General;  Laterality: Right;  . SKIN CANCER EXCISION     face    FAMILY HISTORY: family history includes Brain cancer in her sister; Cervical cancer in her mother; Diabetes in her brother; Heart disease in her brother and father; Hypertension in her brother, father, and mother; Lung cancer in her father and sister; Stroke in her brother.  SOCIAL HISTORY:  reports that she has never smoked. She has never used smokeless tobacco. She reports that she does not drink alcohol or use drugs.  ALLERGIES: Gabapentin; Quinine; Amoxicillin; Dilaudid  [hydromorphone hcl]; Etodolac; Hydrochlorothiazide; Hydrocodone; Hydromorphone; and Sulfa antibiotics  MEDICATIONS:  Current Outpatient Prescriptions  Medication Sig Dispense Refill  . acetaminophen (TYLENOL) 500 MG tablet Take 500 mg by mouth every 6 (six) hours as needed for mild pain or moderate pain.    [name] amLODipine (NORVASC) 5 MG tablet TAKE 1 TABLET BY MOUTH EVERY DAY 90 tablet 3  . aspirin 81 MG tablet Take 81 mg by mouth daily. Hasn't taken in 1 week    . atorvastatin (LIPITOR) 10 MG tablet Take 10 mg by mouth daily at 6 PM.     . Cholecalciferol (VITAMIN D) 2000 units CAPS Take 2,000 Units by mouth daily.     [name] glipiZIDE (GLUCOTROL XL) 5 MG 24 hr tablet TAKE 1 TABLETS BY MOUTH DAILY 90 tablet 3  . glucose blood (ONETOUCH  VERIO) test strip Use as instructed; Check blood sugar twice daily (Patient taking differently: Use as instructed; Check blood sugar twice daily) 100 each 12  . [name] 50-1000 MG tablet TAKE 1 TABLET BY MOUTH TWICE DAILY WITH A MEAL 60 tablet 5  . lisinopril-hydrochlorothiazide (PRINZIDE,ZESTORETIC) 20-12.5 MG tablet  TAKE 2 TABLETS BY MOUTH EVERY MORNING 60 tablet 11  . meloxicam (MOBIC) 15 MG tablet Take 15 mg by mouth daily.     . metoprolol succinate (TOPROL-XL) 25 MG 24 hr tablet Take 1 tablet (25 mg total) by mouth daily. 30 tablet 11  . pioglitazone (ACTOS) 45 MG tablet TAKE 1 TABLET(45 MG) BY MOUTH DAILY 30 tablet 12   No current facility-administered medications for this encounter.     ECOG PERFORMANCE STATUS:  0 - Asymptomatic  REVIEW OF SYSTEMS: Except for the weakness in her knees and morbid obesity Patient denies any weight loss, fatigue, weakness, fever, chills or night sweats. Patient denies any loss of vision, blurred vision. Patient denies any ringing  of the ears or hearing loss. No irregular heartbeat. Patient denies heart murmur or history of fainting. Patient denies any chest pain or pain radiating to her upper extremities. Patient denies any shortness of breath, difficulty breathing at night, cough or hemoptysis. Patient denies any swelling in the lower legs. Patient denies any nausea vomiting, vomiting of blood, or coffee ground material in the vomitus. Patient denies any stomach pain. Patient states has had normal bowel movements no significant constipation or diarrhea. Patient denies any dysuria, hematuria or significant nocturia. Patient denies any problems walking, swelling in the joints or loss of balance. Patient denies any skin changes, loss of hair or loss of weight. Patient denies any excessive worrying or anxiety or significant depression. Patient denies any problems with insomnia. Patient denies excessive thirst, polyuria, polydipsia. Patient denies any swollen glands, patient denies easy bruising or easy bleeding. Patient denies any recent infections, allergies or URI. Patient "s visual fields have not changed significantly in recent time.    PHYSICAL EXAM: BP 137/67   Pulse 67   Temp 97.2 F (36.2 C)   Resp 18   Wt 252 lb 15.7 oz (114.8 kg)   BMI 44.81 kg/m  Morbidly  obese female with large pendulous breasts. She status post wide local excision in the area adjacent to the right areolar complex. Incision is healing well. No dominant mass or nodularity is noted in either breast in 2 positions examined. No axillary or supraclavicular adenopathy is appreciated. Well-developed well-nourished patient in NAD. HEENT reveals PERLA, EOMI, discs not visualized.  Oral cavity is clear. No oral mucosal lesions are identified. Neck is clear without evidence of cervical or supraclavicular adenopathy. Lungs are clear to A&P. Cardiac examination is essentially unremarkable with regular rate and rhythm without murmur rub or thrill. Abdomen is benign with no organomegaly or masses noted. Motor sensory and DTR levels are equal and symmetric in the upper and lower extremities. Cranial nerves II through XII are grossly intact. Proprioception is intact. No peripheral adenopathy or edema is identified. No motor or sensory levels are noted. Crude visual fields are within normal range.  LABORATORY DATA: Pathology reports reviewed    RADIOLOGY RESULTS: Mammogram and ultrasound reviewed   IMPRESSION: Stage 0 high-grade DCIS in 71 year old female status post wide local excision ER/PR positive  PLAN: At this time I discussed the case personally with the surgeon. She is not a MammoSite catheter placement candidate based on the close proximity to the nipple areolar  complex. I am not concerned about her close margin I would boost that area after whole breast radiation to 5040 cGy in 28 fractions would boost that area another 1600 cGy using electron beam boost. Based on the patient's large breast size I've told her that skin reaction will be the most critical issue. Other side effects such as fatigue possible inclusion of superficial lung alteration of blood counts all were described in detail to the patient and her family. I have personally set up and ordered CT simulation for later this week. Patient  also will be a candidate for tamoxifen therapy after completion of radiation.  I would like to take this opportunity to thank you for allowing me to participate in the care of your patient.Armstead Peaks., MD

## 2016-08-03 ENCOUNTER — Ambulatory Visit
Admission: RE | Admit: 2016-08-03 | Discharge: 2016-08-03 | Disposition: A | Discharge status: Home / Self Care | Payer: Medicare Other | Source: Ambulatory Visit | Attending: Radiation Oncology | Admitting: Radiation Oncology

## 2016-08-03 DIAGNOSIS — Z170 Estrogen receptor positive status [ER+]: Secondary | ICD-10-CM | POA: Diagnosis not present

## 2016-08-03 DIAGNOSIS — M25561 Pain in right knee: Secondary | ICD-10-CM | POA: Diagnosis not present

## 2016-08-03 DIAGNOSIS — M25562 Pain in left knee: Secondary | ICD-10-CM | POA: Diagnosis not present

## 2016-08-03 DIAGNOSIS — D0511 Intraductal carcinoma in situ of right breast: Secondary | ICD-10-CM | POA: Diagnosis not present

## 2016-08-03 DIAGNOSIS — E669 Obesity, unspecified: Secondary | ICD-10-CM | POA: Diagnosis not present

## 2016-08-03 DIAGNOSIS — D0512 Intraductal carcinoma in situ of left breast: Secondary | ICD-10-CM | POA: Diagnosis not present

## 2016-08-03 DIAGNOSIS — Z510 Encounter for antineoplastic radiation therapy: Secondary | ICD-10-CM | POA: Diagnosis not present

## 2016-08-04 ENCOUNTER — Other Ambulatory Visit: Payer: Self-pay | Admitting: *Deleted

## 2016-08-04 DIAGNOSIS — D0511 Intraductal carcinoma in situ of right breast: Secondary | ICD-10-CM

## 2016-08-12 ENCOUNTER — Ambulatory Visit
Admission: RE | Admit: 2016-08-12 | Discharge: 2016-08-12 | Disposition: A | Discharge status: Home / Self Care | Payer: Medicare Other | Source: Ambulatory Visit | Attending: Radiation Oncology | Admitting: Radiation Oncology

## 2016-08-12 DIAGNOSIS — D0512 Intraductal carcinoma in situ of left breast: Secondary | ICD-10-CM | POA: Diagnosis not present

## 2016-08-12 DIAGNOSIS — D0511 Intraductal carcinoma in situ of right breast: Secondary | ICD-10-CM | POA: Diagnosis not present

## 2016-08-12 DIAGNOSIS — M25562 Pain in left knee: Secondary | ICD-10-CM | POA: Diagnosis not present

## 2016-08-12 DIAGNOSIS — M25561 Pain in right knee: Secondary | ICD-10-CM | POA: Diagnosis not present

## 2016-08-12 DIAGNOSIS — Z510 Encounter for antineoplastic radiation therapy: Secondary | ICD-10-CM | POA: Diagnosis not present

## 2016-08-12 DIAGNOSIS — E669 Obesity, unspecified: Secondary | ICD-10-CM | POA: Diagnosis not present

## 2016-08-12 DIAGNOSIS — Z170 Estrogen receptor positive status [ER+]: Secondary | ICD-10-CM | POA: Diagnosis not present

## 2016-08-15 ENCOUNTER — Ambulatory Visit: Payer: Medicare Other

## 2016-08-15 ENCOUNTER — Ambulatory Visit
Admission: RE | Admit: 2016-08-15 | Discharge: 2016-08-15 | Disposition: A | Discharge status: Home / Self Care | Payer: Medicare Other | Source: Ambulatory Visit | Attending: Radiation Oncology | Admitting: Radiation Oncology

## 2016-08-15 DIAGNOSIS — Z170 Estrogen receptor positive status [ER+]: Secondary | ICD-10-CM | POA: Diagnosis not present

## 2016-08-15 DIAGNOSIS — E669 Obesity, unspecified: Secondary | ICD-10-CM | POA: Diagnosis not present

## 2016-08-15 DIAGNOSIS — Z510 Encounter for antineoplastic radiation therapy: Secondary | ICD-10-CM | POA: Diagnosis not present

## 2016-08-15 DIAGNOSIS — M25561 Pain in right knee: Secondary | ICD-10-CM | POA: Diagnosis not present

## 2016-08-15 DIAGNOSIS — M25562 Pain in left knee: Secondary | ICD-10-CM | POA: Diagnosis not present

## 2016-08-15 DIAGNOSIS — D0512 Intraductal carcinoma in situ of left breast: Secondary | ICD-10-CM | POA: Diagnosis not present

## 2016-08-16 ENCOUNTER — Ambulatory Visit: Payer: Medicare Other

## 2016-08-16 ENCOUNTER — Ambulatory Visit
Admission: RE | Admit: 2016-08-16 | Discharge: 2016-08-16 | Disposition: A | Discharge status: Home / Self Care | Payer: Medicare Other | Source: Ambulatory Visit | Attending: Radiation Oncology | Admitting: Radiation Oncology

## 2016-08-16 DIAGNOSIS — D0512 Intraductal carcinoma in situ of left breast: Secondary | ICD-10-CM | POA: Diagnosis not present

## 2016-08-16 DIAGNOSIS — E669 Obesity, unspecified: Secondary | ICD-10-CM | POA: Diagnosis not present

## 2016-08-16 DIAGNOSIS — M25561 Pain in right knee: Secondary | ICD-10-CM | POA: Diagnosis not present

## 2016-08-16 DIAGNOSIS — Z170 Estrogen receptor positive status [ER+]: Secondary | ICD-10-CM | POA: Diagnosis not present

## 2016-08-16 DIAGNOSIS — M25562 Pain in left knee: Secondary | ICD-10-CM | POA: Diagnosis not present

## 2016-08-16 DIAGNOSIS — Z510 Encounter for antineoplastic radiation therapy: Secondary | ICD-10-CM | POA: Diagnosis not present

## 2016-08-17 ENCOUNTER — Ambulatory Visit: Payer: Medicare Other

## 2016-08-17 ENCOUNTER — Ambulatory Visit: Admission: RE | Admit: 2016-08-17 | Payer: Medicare Other | Source: Ambulatory Visit

## 2016-08-17 ENCOUNTER — Ambulatory Visit
Admission: RE | Admit: 2016-08-17 | Discharge: 2016-08-17 | Disposition: A | Discharge status: Home / Self Care | Payer: Medicare Other | Source: Ambulatory Visit | Attending: Radiation Oncology | Admitting: Radiation Oncology

## 2016-08-18 ENCOUNTER — Other Ambulatory Visit: Payer: Self-pay | Admitting: Family Medicine

## 2016-08-18 ENCOUNTER — Ambulatory Visit
Admission: RE | Admit: 2016-08-18 | Discharge: 2016-08-18 | Disposition: A | Discharge status: Home / Self Care | Payer: Medicare Other | Source: Ambulatory Visit | Attending: Radiation Oncology | Admitting: Radiation Oncology

## 2016-08-18 ENCOUNTER — Ambulatory Visit: Payer: Medicare Other

## 2016-08-18 DIAGNOSIS — Z510 Encounter for antineoplastic radiation therapy: Secondary | ICD-10-CM | POA: Diagnosis not present

## 2016-08-18 DIAGNOSIS — M25561 Pain in right knee: Secondary | ICD-10-CM | POA: Diagnosis not present

## 2016-08-18 DIAGNOSIS — E118 Type 2 diabetes mellitus with unspecified complications: Secondary | ICD-10-CM

## 2016-08-18 DIAGNOSIS — D0512 Intraductal carcinoma in situ of left breast: Secondary | ICD-10-CM | POA: Diagnosis not present

## 2016-08-18 DIAGNOSIS — M25562 Pain in left knee: Secondary | ICD-10-CM | POA: Diagnosis not present

## 2016-08-18 DIAGNOSIS — E669 Obesity, unspecified: Secondary | ICD-10-CM | POA: Diagnosis not present

## 2016-08-18 DIAGNOSIS — Z170 Estrogen receptor positive status [ER+]: Secondary | ICD-10-CM | POA: Diagnosis not present

## 2016-08-19 ENCOUNTER — Ambulatory Visit
Admission: RE | Admit: 2016-08-19 | Discharge: 2016-08-19 | Disposition: A | Discharge status: Home / Self Care | Payer: Medicare Other | Source: Ambulatory Visit | Attending: Radiation Oncology | Admitting: Radiation Oncology

## 2016-08-19 ENCOUNTER — Ambulatory Visit: Payer: Medicare Other

## 2016-08-19 DIAGNOSIS — Z510 Encounter for antineoplastic radiation therapy: Secondary | ICD-10-CM | POA: Diagnosis not present

## 2016-08-19 DIAGNOSIS — M25562 Pain in left knee: Secondary | ICD-10-CM | POA: Diagnosis not present

## 2016-08-19 DIAGNOSIS — Z170 Estrogen receptor positive status [ER+]: Secondary | ICD-10-CM | POA: Diagnosis not present

## 2016-08-19 DIAGNOSIS — D0512 Intraductal carcinoma in situ of left breast: Secondary | ICD-10-CM | POA: Diagnosis not present

## 2016-08-19 DIAGNOSIS — M25561 Pain in right knee: Secondary | ICD-10-CM | POA: Diagnosis not present

## 2016-08-19 DIAGNOSIS — E669 Obesity, unspecified: Secondary | ICD-10-CM | POA: Diagnosis not present

## 2016-08-22 ENCOUNTER — Ambulatory Visit
Admission: RE | Admit: 2016-08-22 | Discharge: 2016-08-22 | Disposition: A | Discharge status: Home / Self Care | Payer: Medicare Other | Source: Ambulatory Visit | Attending: Radiation Oncology | Admitting: Radiation Oncology

## 2016-08-22 ENCOUNTER — Ambulatory Visit: Payer: Medicare Other

## 2016-08-22 DIAGNOSIS — M25562 Pain in left knee: Secondary | ICD-10-CM | POA: Diagnosis not present

## 2016-08-22 DIAGNOSIS — Z170 Estrogen receptor positive status [ER+]: Secondary | ICD-10-CM | POA: Diagnosis not present

## 2016-08-22 DIAGNOSIS — E669 Obesity, unspecified: Secondary | ICD-10-CM | POA: Diagnosis not present

## 2016-08-22 DIAGNOSIS — D0512 Intraductal carcinoma in situ of left breast: Secondary | ICD-10-CM | POA: Diagnosis not present

## 2016-08-22 DIAGNOSIS — M25561 Pain in right knee: Secondary | ICD-10-CM | POA: Diagnosis not present

## 2016-08-22 DIAGNOSIS — D0511 Intraductal carcinoma in situ of right breast: Secondary | ICD-10-CM | POA: Diagnosis not present

## 2016-08-22 DIAGNOSIS — Z510 Encounter for antineoplastic radiation therapy: Secondary | ICD-10-CM | POA: Diagnosis not present

## 2016-08-23 ENCOUNTER — Ambulatory Visit: Payer: Medicare Other

## 2016-08-23 ENCOUNTER — Ambulatory Visit
Admission: RE | Admit: 2016-08-23 | Discharge: 2016-08-23 | Disposition: A | Discharge status: Home / Self Care | Payer: Medicare Other | Source: Ambulatory Visit | Attending: Radiation Oncology | Admitting: Radiation Oncology

## 2016-08-23 DIAGNOSIS — M25562 Pain in left knee: Secondary | ICD-10-CM | POA: Diagnosis not present

## 2016-08-23 DIAGNOSIS — D0512 Intraductal carcinoma in situ of left breast: Secondary | ICD-10-CM | POA: Diagnosis not present

## 2016-08-23 DIAGNOSIS — E669 Obesity, unspecified: Secondary | ICD-10-CM | POA: Diagnosis not present

## 2016-08-23 DIAGNOSIS — Z510 Encounter for antineoplastic radiation therapy: Secondary | ICD-10-CM | POA: Diagnosis not present

## 2016-08-23 DIAGNOSIS — Z170 Estrogen receptor positive status [ER+]: Secondary | ICD-10-CM | POA: Diagnosis not present

## 2016-08-23 DIAGNOSIS — M25561 Pain in right knee: Secondary | ICD-10-CM | POA: Diagnosis not present

## 2016-08-24 ENCOUNTER — Ambulatory Visit
Admission: RE | Admit: 2016-08-24 | Discharge: 2016-08-24 | Disposition: A | Discharge status: Home / Self Care | Payer: Medicare Other | Source: Ambulatory Visit | Attending: Radiation Oncology | Admitting: Radiation Oncology

## 2016-08-24 ENCOUNTER — Ambulatory Visit: Payer: Medicare Other

## 2016-08-24 DIAGNOSIS — E669 Obesity, unspecified: Secondary | ICD-10-CM | POA: Diagnosis not present

## 2016-08-24 DIAGNOSIS — M25561 Pain in right knee: Secondary | ICD-10-CM | POA: Diagnosis not present

## 2016-08-24 DIAGNOSIS — D0512 Intraductal carcinoma in situ of left breast: Secondary | ICD-10-CM | POA: Diagnosis not present

## 2016-08-24 DIAGNOSIS — M25562 Pain in left knee: Secondary | ICD-10-CM | POA: Diagnosis not present

## 2016-08-24 DIAGNOSIS — Z170 Estrogen receptor positive status [ER+]: Secondary | ICD-10-CM | POA: Diagnosis not present

## 2016-08-24 DIAGNOSIS — Z510 Encounter for antineoplastic radiation therapy: Secondary | ICD-10-CM | POA: Diagnosis not present

## 2016-08-25 ENCOUNTER — Ambulatory Visit: Payer: Medicare Other

## 2016-08-25 ENCOUNTER — Ambulatory Visit
Admission: RE | Admit: 2016-08-25 | Discharge: 2016-08-25 | Disposition: A | Discharge status: Home / Self Care | Payer: Medicare Other | Source: Ambulatory Visit | Attending: Radiation Oncology | Admitting: Radiation Oncology

## 2016-08-25 DIAGNOSIS — E669 Obesity, unspecified: Secondary | ICD-10-CM | POA: Diagnosis not present

## 2016-08-25 DIAGNOSIS — Z170 Estrogen receptor positive status [ER+]: Secondary | ICD-10-CM | POA: Diagnosis not present

## 2016-08-25 DIAGNOSIS — M25562 Pain in left knee: Secondary | ICD-10-CM | POA: Diagnosis not present

## 2016-08-25 DIAGNOSIS — M25561 Pain in right knee: Secondary | ICD-10-CM | POA: Diagnosis not present

## 2016-08-25 DIAGNOSIS — Z510 Encounter for antineoplastic radiation therapy: Secondary | ICD-10-CM | POA: Diagnosis not present

## 2016-08-25 DIAGNOSIS — D0512 Intraductal carcinoma in situ of left breast: Secondary | ICD-10-CM | POA: Diagnosis not present

## 2016-08-26 ENCOUNTER — Ambulatory Visit: Payer: Medicare Other

## 2016-08-26 ENCOUNTER — Ambulatory Visit
Admission: RE | Admit: 2016-08-26 | Discharge: 2016-08-26 | Disposition: A | Discharge status: Home / Self Care | Payer: Medicare Other | Source: Ambulatory Visit | Attending: Radiation Oncology | Admitting: Radiation Oncology

## 2016-08-26 DIAGNOSIS — E669 Obesity, unspecified: Secondary | ICD-10-CM | POA: Diagnosis not present

## 2016-08-26 DIAGNOSIS — D0512 Intraductal carcinoma in situ of left breast: Secondary | ICD-10-CM | POA: Diagnosis not present

## 2016-08-26 DIAGNOSIS — M25561 Pain in right knee: Secondary | ICD-10-CM | POA: Diagnosis not present

## 2016-08-26 DIAGNOSIS — Z510 Encounter for antineoplastic radiation therapy: Secondary | ICD-10-CM | POA: Diagnosis not present

## 2016-08-26 DIAGNOSIS — Z170 Estrogen receptor positive status [ER+]: Secondary | ICD-10-CM | POA: Diagnosis not present

## 2016-08-26 DIAGNOSIS — M25562 Pain in left knee: Secondary | ICD-10-CM | POA: Diagnosis not present

## 2016-08-29 ENCOUNTER — Ambulatory Visit
Admission: RE | Admit: 2016-08-29 | Discharge: 2016-08-29 | Disposition: A | Discharge status: Home / Self Care | Payer: Medicare Other | Source: Ambulatory Visit | Attending: Radiation Oncology | Admitting: Radiation Oncology

## 2016-08-29 ENCOUNTER — Ambulatory Visit: Payer: Medicare Other

## 2016-08-29 DIAGNOSIS — E669 Obesity, unspecified: Secondary | ICD-10-CM | POA: Diagnosis not present

## 2016-08-29 DIAGNOSIS — M25561 Pain in right knee: Secondary | ICD-10-CM | POA: Diagnosis not present

## 2016-08-29 DIAGNOSIS — D0511 Intraductal carcinoma in situ of right breast: Secondary | ICD-10-CM | POA: Diagnosis not present

## 2016-08-29 DIAGNOSIS — M25562 Pain in left knee: Secondary | ICD-10-CM | POA: Diagnosis not present

## 2016-08-29 DIAGNOSIS — Z170 Estrogen receptor positive status [ER+]: Secondary | ICD-10-CM | POA: Diagnosis not present

## 2016-08-29 DIAGNOSIS — Z510 Encounter for antineoplastic radiation therapy: Secondary | ICD-10-CM | POA: Diagnosis not present

## 2016-08-29 DIAGNOSIS — D0512 Intraductal carcinoma in situ of left breast: Secondary | ICD-10-CM | POA: Diagnosis not present

## 2016-08-30 ENCOUNTER — Ambulatory Visit: Payer: Medicare Other

## 2016-08-30 ENCOUNTER — Inpatient Hospital Stay: Payer: Medicare Other | Attending: Radiation Oncology

## 2016-08-30 ENCOUNTER — Ambulatory Visit
Admission: RE | Admit: 2016-08-30 | Discharge: 2016-08-30 | Disposition: A | Discharge status: Home / Self Care | Payer: Medicare Other | Source: Ambulatory Visit | Attending: Radiation Oncology | Admitting: Radiation Oncology

## 2016-08-30 DIAGNOSIS — E669 Obesity, unspecified: Secondary | ICD-10-CM | POA: Diagnosis not present

## 2016-08-30 DIAGNOSIS — E785 Hyperlipidemia, unspecified: Secondary | ICD-10-CM | POA: Insufficient documentation

## 2016-08-30 DIAGNOSIS — I10 Essential (primary) hypertension: Secondary | ICD-10-CM | POA: Insufficient documentation

## 2016-08-30 DIAGNOSIS — Z7984 Long term (current) use of oral hypoglycemic drugs: Secondary | ICD-10-CM | POA: Insufficient documentation

## 2016-08-30 DIAGNOSIS — Z7982 Long term (current) use of aspirin: Secondary | ICD-10-CM | POA: Diagnosis not present

## 2016-08-30 DIAGNOSIS — E039 Hypothyroidism, unspecified: Secondary | ICD-10-CM | POA: Diagnosis not present

## 2016-08-30 DIAGNOSIS — Z923 Personal history of irradiation: Secondary | ICD-10-CM | POA: Diagnosis not present

## 2016-08-30 DIAGNOSIS — E119 Type 2 diabetes mellitus without complications: Secondary | ICD-10-CM | POA: Insufficient documentation

## 2016-08-30 DIAGNOSIS — Z79899 Other long term (current) drug therapy: Secondary | ICD-10-CM | POA: Diagnosis not present

## 2016-08-30 DIAGNOSIS — E0590 Thyrotoxicosis, unspecified without thyrotoxic crisis or storm: Secondary | ICD-10-CM | POA: Insufficient documentation

## 2016-08-30 DIAGNOSIS — Z510 Encounter for antineoplastic radiation therapy: Secondary | ICD-10-CM | POA: Diagnosis not present

## 2016-08-30 DIAGNOSIS — Z170 Estrogen receptor positive status [ER+]: Secondary | ICD-10-CM | POA: Insufficient documentation

## 2016-08-30 DIAGNOSIS — Z79810 Long term (current) use of selective estrogen receptor modulators (SERMs): Secondary | ICD-10-CM | POA: Diagnosis not present

## 2016-08-30 DIAGNOSIS — D0511 Intraductal carcinoma in situ of right breast: Secondary | ICD-10-CM | POA: Diagnosis present

## 2016-08-30 DIAGNOSIS — D0512 Intraductal carcinoma in situ of left breast: Secondary | ICD-10-CM | POA: Diagnosis not present

## 2016-08-30 DIAGNOSIS — E559 Vitamin D deficiency, unspecified: Secondary | ICD-10-CM | POA: Insufficient documentation

## 2016-08-30 DIAGNOSIS — Z85828 Personal history of other malignant neoplasm of skin: Secondary | ICD-10-CM | POA: Diagnosis not present

## 2016-08-30 DIAGNOSIS — M25561 Pain in right knee: Secondary | ICD-10-CM | POA: Diagnosis not present

## 2016-08-30 DIAGNOSIS — M25562 Pain in left knee: Secondary | ICD-10-CM | POA: Diagnosis not present

## 2016-08-30 LAB — CBC
HEMATOCRIT: 31.9 % — AB (ref 35.0–47.0)
HEMOGLOBIN: 10.4 g/dL — AB (ref 12.0–16.0)
MCH: 27.4 pg (ref 26.0–34.0)
MCHC: 32.7 g/dL (ref 32.0–36.0)
MCV: 84 fL (ref 80.0–100.0)
PLATELETS: 222 10*3/uL (ref 150–440)
RBC: 3.8 MIL/uL (ref 3.80–5.20)
RDW: 15.2 % — ABNORMAL HIGH (ref 11.5–14.5)
WBC: 6.4 10*3/uL (ref 3.6–11.0)

## 2016-08-31 ENCOUNTER — Ambulatory Visit
Admission: RE | Admit: 2016-08-31 | Discharge: 2016-08-31 | Disposition: A | Discharge status: Home / Self Care | Payer: Medicare Other | Source: Ambulatory Visit | Attending: Radiation Oncology | Admitting: Radiation Oncology

## 2016-08-31 ENCOUNTER — Ambulatory Visit: Payer: Medicare Other

## 2016-08-31 DIAGNOSIS — Z510 Encounter for antineoplastic radiation therapy: Secondary | ICD-10-CM | POA: Diagnosis not present

## 2016-08-31 DIAGNOSIS — D0512 Intraductal carcinoma in situ of left breast: Secondary | ICD-10-CM | POA: Diagnosis not present

## 2016-08-31 DIAGNOSIS — Z170 Estrogen receptor positive status [ER+]: Secondary | ICD-10-CM | POA: Diagnosis not present

## 2016-08-31 DIAGNOSIS — E669 Obesity, unspecified: Secondary | ICD-10-CM | POA: Diagnosis not present

## 2016-08-31 DIAGNOSIS — M25561 Pain in right knee: Secondary | ICD-10-CM | POA: Diagnosis not present

## 2016-08-31 DIAGNOSIS — M25562 Pain in left knee: Secondary | ICD-10-CM | POA: Diagnosis not present

## 2016-09-01 ENCOUNTER — Ambulatory Visit: Payer: Medicare Other

## 2016-09-01 ENCOUNTER — Ambulatory Visit
Admission: RE | Admit: 2016-09-01 | Discharge: 2016-09-01 | Disposition: A | Discharge status: Home / Self Care | Payer: Medicare Other | Source: Ambulatory Visit | Attending: Radiation Oncology | Admitting: Radiation Oncology

## 2016-09-01 DIAGNOSIS — M25562 Pain in left knee: Secondary | ICD-10-CM | POA: Diagnosis not present

## 2016-09-01 DIAGNOSIS — D0512 Intraductal carcinoma in situ of left breast: Secondary | ICD-10-CM | POA: Diagnosis not present

## 2016-09-01 DIAGNOSIS — Z170 Estrogen receptor positive status [ER+]: Secondary | ICD-10-CM | POA: Diagnosis not present

## 2016-09-01 DIAGNOSIS — Z510 Encounter for antineoplastic radiation therapy: Secondary | ICD-10-CM | POA: Diagnosis not present

## 2016-09-01 DIAGNOSIS — E669 Obesity, unspecified: Secondary | ICD-10-CM | POA: Diagnosis not present

## 2016-09-01 DIAGNOSIS — M25561 Pain in right knee: Secondary | ICD-10-CM | POA: Diagnosis not present

## 2016-09-02 ENCOUNTER — Ambulatory Visit: Payer: Medicare Other

## 2016-09-02 ENCOUNTER — Ambulatory Visit
Admission: RE | Admit: 2016-09-02 | Discharge: 2016-09-02 | Disposition: A | Discharge status: Home / Self Care | Payer: Medicare Other | Source: Ambulatory Visit | Attending: Radiation Oncology | Admitting: Radiation Oncology

## 2016-09-02 DIAGNOSIS — D0512 Intraductal carcinoma in situ of left breast: Secondary | ICD-10-CM | POA: Diagnosis not present

## 2016-09-02 DIAGNOSIS — E669 Obesity, unspecified: Secondary | ICD-10-CM | POA: Diagnosis not present

## 2016-09-02 DIAGNOSIS — M25562 Pain in left knee: Secondary | ICD-10-CM | POA: Diagnosis not present

## 2016-09-02 DIAGNOSIS — Z510 Encounter for antineoplastic radiation therapy: Secondary | ICD-10-CM | POA: Diagnosis not present

## 2016-09-02 DIAGNOSIS — M25561 Pain in right knee: Secondary | ICD-10-CM | POA: Diagnosis not present

## 2016-09-02 DIAGNOSIS — Z170 Estrogen receptor positive status [ER+]: Secondary | ICD-10-CM | POA: Diagnosis not present

## 2016-09-05 ENCOUNTER — Ambulatory Visit
Admission: RE | Admit: 2016-09-05 | Discharge: 2016-09-05 | Disposition: A | Discharge status: Home / Self Care | Payer: Medicare Other | Source: Ambulatory Visit | Attending: Radiation Oncology | Admitting: Radiation Oncology

## 2016-09-05 ENCOUNTER — Ambulatory Visit: Payer: Medicare Other

## 2016-09-05 DIAGNOSIS — M25561 Pain in right knee: Secondary | ICD-10-CM | POA: Diagnosis not present

## 2016-09-05 DIAGNOSIS — D0511 Intraductal carcinoma in situ of right breast: Secondary | ICD-10-CM | POA: Diagnosis not present

## 2016-09-05 DIAGNOSIS — D0512 Intraductal carcinoma in situ of left breast: Secondary | ICD-10-CM | POA: Diagnosis not present

## 2016-09-05 DIAGNOSIS — E669 Obesity, unspecified: Secondary | ICD-10-CM | POA: Diagnosis not present

## 2016-09-05 DIAGNOSIS — Z170 Estrogen receptor positive status [ER+]: Secondary | ICD-10-CM | POA: Diagnosis not present

## 2016-09-05 DIAGNOSIS — Z510 Encounter for antineoplastic radiation therapy: Secondary | ICD-10-CM | POA: Diagnosis not present

## 2016-09-05 DIAGNOSIS — M25562 Pain in left knee: Secondary | ICD-10-CM | POA: Diagnosis not present

## 2016-09-06 ENCOUNTER — Ambulatory Visit: Payer: Medicare Other

## 2016-09-06 ENCOUNTER — Ambulatory Visit
Admission: RE | Admit: 2016-09-06 | Discharge: 2016-09-06 | Disposition: A | Discharge status: Home / Self Care | Payer: Medicare Other | Source: Ambulatory Visit | Attending: Radiation Oncology | Admitting: Radiation Oncology

## 2016-09-06 DIAGNOSIS — D0512 Intraductal carcinoma in situ of left breast: Secondary | ICD-10-CM | POA: Diagnosis not present

## 2016-09-06 DIAGNOSIS — M25562 Pain in left knee: Secondary | ICD-10-CM | POA: Diagnosis not present

## 2016-09-06 DIAGNOSIS — Z510 Encounter for antineoplastic radiation therapy: Secondary | ICD-10-CM | POA: Diagnosis not present

## 2016-09-06 DIAGNOSIS — E669 Obesity, unspecified: Secondary | ICD-10-CM | POA: Diagnosis not present

## 2016-09-06 DIAGNOSIS — M25561 Pain in right knee: Secondary | ICD-10-CM | POA: Diagnosis not present

## 2016-09-06 DIAGNOSIS — Z170 Estrogen receptor positive status [ER+]: Secondary | ICD-10-CM | POA: Diagnosis not present

## 2016-09-07 ENCOUNTER — Ambulatory Visit
Admission: RE | Admit: 2016-09-07 | Discharge: 2016-09-07 | Disposition: A | Discharge status: Home / Self Care | Payer: Medicare Other | Source: Ambulatory Visit | Attending: Radiation Oncology | Admitting: Radiation Oncology

## 2016-09-07 ENCOUNTER — Ambulatory Visit: Payer: Medicare Other

## 2016-09-07 DIAGNOSIS — Z170 Estrogen receptor positive status [ER+]: Secondary | ICD-10-CM | POA: Diagnosis not present

## 2016-09-07 DIAGNOSIS — M25562 Pain in left knee: Secondary | ICD-10-CM | POA: Diagnosis not present

## 2016-09-07 DIAGNOSIS — D0512 Intraductal carcinoma in situ of left breast: Secondary | ICD-10-CM | POA: Diagnosis not present

## 2016-09-07 DIAGNOSIS — E669 Obesity, unspecified: Secondary | ICD-10-CM | POA: Diagnosis not present

## 2016-09-07 DIAGNOSIS — M25561 Pain in right knee: Secondary | ICD-10-CM | POA: Diagnosis not present

## 2016-09-07 DIAGNOSIS — Z510 Encounter for antineoplastic radiation therapy: Secondary | ICD-10-CM | POA: Diagnosis not present

## 2016-09-08 ENCOUNTER — Ambulatory Visit: Payer: Medicare Other

## 2016-09-08 ENCOUNTER — Ambulatory Visit
Admission: RE | Admit: 2016-09-08 | Discharge: 2016-09-08 | Disposition: A | Discharge status: Home / Self Care | Payer: Medicare Other | Source: Ambulatory Visit | Attending: Radiation Oncology | Admitting: Radiation Oncology

## 2016-09-08 DIAGNOSIS — E669 Obesity, unspecified: Secondary | ICD-10-CM | POA: Diagnosis not present

## 2016-09-08 DIAGNOSIS — M25561 Pain in right knee: Secondary | ICD-10-CM | POA: Diagnosis not present

## 2016-09-08 DIAGNOSIS — Z510 Encounter for antineoplastic radiation therapy: Secondary | ICD-10-CM | POA: Diagnosis not present

## 2016-09-08 DIAGNOSIS — M25562 Pain in left knee: Secondary | ICD-10-CM | POA: Diagnosis not present

## 2016-09-08 DIAGNOSIS — D0512 Intraductal carcinoma in situ of left breast: Secondary | ICD-10-CM | POA: Diagnosis not present

## 2016-09-08 DIAGNOSIS — Z170 Estrogen receptor positive status [ER+]: Secondary | ICD-10-CM | POA: Diagnosis not present

## 2016-09-09 ENCOUNTER — Telehealth: Payer: Self-pay

## 2016-09-09 ENCOUNTER — Ambulatory Visit: Payer: Medicare Other

## 2016-09-09 ENCOUNTER — Ambulatory Visit
Admission: RE | Admit: 2016-09-09 | Discharge: 2016-09-09 | Disposition: A | Discharge status: Home / Self Care | Payer: Medicare Other | Source: Ambulatory Visit | Attending: Radiation Oncology | Admitting: Radiation Oncology

## 2016-09-09 DIAGNOSIS — Z510 Encounter for antineoplastic radiation therapy: Secondary | ICD-10-CM | POA: Diagnosis not present

## 2016-09-09 DIAGNOSIS — M25562 Pain in left knee: Secondary | ICD-10-CM | POA: Diagnosis not present

## 2016-09-09 DIAGNOSIS — Z170 Estrogen receptor positive status [ER+]: Secondary | ICD-10-CM | POA: Diagnosis not present

## 2016-09-09 DIAGNOSIS — D0512 Intraductal carcinoma in situ of left breast: Secondary | ICD-10-CM | POA: Diagnosis not present

## 2016-09-09 DIAGNOSIS — M25561 Pain in right knee: Secondary | ICD-10-CM | POA: Diagnosis not present

## 2016-09-09 DIAGNOSIS — E669 Obesity, unspecified: Secondary | ICD-10-CM | POA: Diagnosis not present

## 2016-09-09 NOTE — Telephone Encounter (Signed)
According to [facility] note patient is to rtc 3 month approximately after her XRT treatment for further evaluation and we had her scheduled to [place] [facility].

## 2016-09-09 NOTE — Telephone Encounter (Signed)
Pt states that she was supposed to be seen a week before her last treatment to try a new medication.  Please call and advise.

## 2016-09-12 ENCOUNTER — Ambulatory Visit: Payer: Medicare Other

## 2016-09-12 ENCOUNTER — Ambulatory Visit
Admission: RE | Admit: 2016-09-12 | Discharge: 2016-09-12 | Disposition: A | Discharge status: Home / Self Care | Payer: Medicare Other | Source: Ambulatory Visit | Attending: Radiation Oncology | Admitting: Radiation Oncology

## 2016-09-12 DIAGNOSIS — E669 Obesity, unspecified: Secondary | ICD-10-CM | POA: Diagnosis not present

## 2016-09-12 DIAGNOSIS — M25562 Pain in left knee: Secondary | ICD-10-CM | POA: Diagnosis not present

## 2016-09-12 DIAGNOSIS — Z170 Estrogen receptor positive status [ER+]: Secondary | ICD-10-CM | POA: Diagnosis not present

## 2016-09-12 DIAGNOSIS — D0512 Intraductal carcinoma in situ of left breast: Secondary | ICD-10-CM | POA: Diagnosis not present

## 2016-09-12 DIAGNOSIS — M25561 Pain in right knee: Secondary | ICD-10-CM | POA: Diagnosis not present

## 2016-09-12 DIAGNOSIS — D0511 Intraductal carcinoma in situ of right breast: Secondary | ICD-10-CM | POA: Diagnosis not present

## 2016-09-12 DIAGNOSIS — Z510 Encounter for antineoplastic radiation therapy: Secondary | ICD-10-CM | POA: Diagnosis not present

## 2016-09-13 ENCOUNTER — Ambulatory Visit
Admission: RE | Admit: 2016-09-13 | Discharge: 2016-09-13 | Disposition: A | Discharge status: Home / Self Care | Payer: Medicare Other | Source: Ambulatory Visit | Attending: Radiation Oncology | Admitting: Radiation Oncology

## 2016-09-13 ENCOUNTER — Ambulatory Visit: Payer: Medicare Other

## 2016-09-13 ENCOUNTER — Inpatient Hospital Stay: Payer: Medicare Other

## 2016-09-13 DIAGNOSIS — E119 Type 2 diabetes mellitus without complications: Secondary | ICD-10-CM | POA: Diagnosis not present

## 2016-09-13 DIAGNOSIS — D0511 Intraductal carcinoma in situ of right breast: Secondary | ICD-10-CM | POA: Diagnosis not present

## 2016-09-13 DIAGNOSIS — Z510 Encounter for antineoplastic radiation therapy: Secondary | ICD-10-CM | POA: Diagnosis not present

## 2016-09-13 DIAGNOSIS — Z79810 Long term (current) use of selective estrogen receptor modulators (SERMs): Secondary | ICD-10-CM | POA: Diagnosis not present

## 2016-09-13 DIAGNOSIS — E669 Obesity, unspecified: Secondary | ICD-10-CM | POA: Diagnosis not present

## 2016-09-13 DIAGNOSIS — M25561 Pain in right knee: Secondary | ICD-10-CM | POA: Diagnosis not present

## 2016-09-13 DIAGNOSIS — I10 Essential (primary) hypertension: Secondary | ICD-10-CM | POA: Diagnosis not present

## 2016-09-13 DIAGNOSIS — M25562 Pain in left knee: Secondary | ICD-10-CM | POA: Diagnosis not present

## 2016-09-13 DIAGNOSIS — D0512 Intraductal carcinoma in situ of left breast: Secondary | ICD-10-CM | POA: Diagnosis not present

## 2016-09-13 DIAGNOSIS — Z170 Estrogen receptor positive status [ER+]: Secondary | ICD-10-CM | POA: Diagnosis not present

## 2016-09-13 DIAGNOSIS — E785 Hyperlipidemia, unspecified: Secondary | ICD-10-CM | POA: Diagnosis not present

## 2016-09-13 LAB — CBC
HEMATOCRIT: 32.6 % — AB (ref 35.0–47.0)
Hemoglobin: 10.7 g/dL — ABNORMAL LOW (ref 12.0–16.0)
MCH: 27.4 pg (ref 26.0–34.0)
MCHC: 32.9 g/dL (ref 32.0–36.0)
MCV: 83.3 fL (ref 80.0–100.0)
Platelets: 226 10*3/uL (ref 150–440)
RBC: 3.91 MIL/uL (ref 3.80–5.20)
RDW: 15.2 % — ABNORMAL HIGH (ref 11.5–14.5)
WBC: 5.9 10*3/uL (ref 3.6–11.0)

## 2016-09-14 ENCOUNTER — Ambulatory Visit
Admission: RE | Admit: 2016-09-14 | Discharge: 2016-09-14 | Disposition: A | Discharge status: Home / Self Care | Payer: Medicare Other | Source: Ambulatory Visit | Attending: Radiation Oncology | Admitting: Radiation Oncology

## 2016-09-14 ENCOUNTER — Ambulatory Visit: Payer: Medicare Other

## 2016-09-14 DIAGNOSIS — E669 Obesity, unspecified: Secondary | ICD-10-CM | POA: Diagnosis not present

## 2016-09-14 DIAGNOSIS — Z510 Encounter for antineoplastic radiation therapy: Secondary | ICD-10-CM | POA: Diagnosis not present

## 2016-09-14 DIAGNOSIS — Z170 Estrogen receptor positive status [ER+]: Secondary | ICD-10-CM | POA: Diagnosis not present

## 2016-09-14 DIAGNOSIS — D0512 Intraductal carcinoma in situ of left breast: Secondary | ICD-10-CM | POA: Diagnosis not present

## 2016-09-14 DIAGNOSIS — M25561 Pain in right knee: Secondary | ICD-10-CM | POA: Diagnosis not present

## 2016-09-14 DIAGNOSIS — M25562 Pain in left knee: Secondary | ICD-10-CM | POA: Diagnosis not present

## 2016-09-15 ENCOUNTER — Ambulatory Visit: Payer: Medicare Other

## 2016-09-16 ENCOUNTER — Ambulatory Visit: Payer: Medicare Other

## 2016-09-18 ENCOUNTER — Ambulatory Visit: Payer: Medicare Other

## 2016-09-18 NOTE — Progress Notes (Signed)
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 She is tolerating her XRT well and we will complete later this week. She currently feels well and is asymptomatic. She has no neurologic complaints. She denies any recent fevers or illnesses. She has a good appetite and denies weight loss. She has no chest pain or shortness of breath. She denies any nausea, vomiting, constipation, or diarrhea. She has no urinary complaints. Patient offers no specific complaints today.  REVIEW OF SYSTEMS:   Review of Systems  Constitutional: Negative.  Negative for fever, malaise/fatigue and weight loss.  Respiratory: Negative.  Negative for cough and shortness of breath.   Cardiovascular: Negative.  Negative for chest pain and leg swelling.  Gastrointestinal: Negative.  Negative for abdominal pain.  Genitourinary: Negative.   Musculoskeletal: Negative.   Skin: Negative.  Negative for rash.  Neurological: Negative.  Negative for weakness.  Psychiatric/Behavioral: Negative.  The patient is not nervous/anxious.     As per HPI. Otherwise, a complete review of systems is negative.  PAST MEDICAL HISTORY: Past Medical History:  Diagnosis Date  . Anemia   . Arthritis   . Breast cancer ([name]) 06/28/2016   right breast DCIS grade 3  . 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 Diabetes mellitus without complication  ([name])   . Hyperlipidemia   . Hypertension   . Hyperthyroidism   . Obesity   . Thyroid disease    hyperthyroidism  . Vitamin D deficiency     PAST SURGICAL HISTORY: Past Surgical History:  Procedure Laterality Date  . BREAST BIOPSY Right 06/28/2016   stereotactic biopsy - DUCTAL CARCINOMA IN SITU (DCIS), HIGH NUCLEAR GRADE   . JOINT REPLACEMENT Left    tkr  . KNEE SURGERY Left    arthroscopy  . MASTECTOMY, PARTIAL Right 07/18/2016   Procedure: MASTECTOMY PARTIAL;  Surgeon: Earline Mayotte, MD;  Location: [facility] ORS;  Service: General;  Laterality: Right;  . SKIN CANCER EXCISION     face    FAMILY HISTORY: Family History  Problem Relation Age of Onset  . Hypertension Mother   . Cervical cancer Mother   . Heart disease Father   . Hypertension Father   . Lung cancer Father   . Lung cancer Sister   . Brain cancer Sister   . Diabetes Brother   . Heart disease Brother   . Hypertension Brother   . Stroke Brother     ADVANCED DIRECTIVES (Y/N):  N  HEALTH MAINTENANCE: Social History  Substance Use Topics  . Smoking status: Never Smoker  . Smokeless tobacco: Never Used  . Alcohol use No     Colonoscopy:  PAP:  Bone density:  Lipid panel:  Allergies  Allergen Reactions  . Gabapentin Swelling  . Quinine Nausea Only and Nausea And Vomiting  . Amoxicillin Itching and Rash    Has patient had a PCN reaction causing immediate rash, facial/tongue/throat swelling, SOB or lightheadedness with hypotension: No Has patient had  a PCN reaction causing severe rash involving mucus membranes or skin necrosis: No Has patient had a PCN reaction that required hospitalization No Has patient had a PCN reaction occurring within the last 10 years: No If all of the above answers are "NO", then may proceed with Cephalosporin use.   . Dilaudid  [Hydromorphone Hcl] Rash  . Etodolac Rash  . Hydrochlorothiazide Rash  . Hydrocodone Rash  . Hydromorphone Rash  . Sulfa Antibiotics Rash     Current Outpatient Prescriptions  Medication Sig Dispense Refill  . acetaminophen (TYLENOL) 500 MG tablet Take 500 mg by mouth every 6 (six) hours as needed for mild pain or moderate pain.    [name] amLODipine (NORVASC) 5 MG tablet TAKE 1 TABLET BY MOUTH EVERY DAY 90 tablet 3  . aspirin 81 MG tablet Take 81 mg by mouth daily. Hasn't taken in 1 week    . atorvastatin (LIPITOR) 10 MG tablet Take 10 mg by mouth daily at 6 PM.     . Cholecalciferol (VITAMIN D) 2000 units CAPS Take 2,000 Units by mouth daily.     [name] glipiZIDE (GLUCOTROL XL) 5 MG 24 hr tablet TAKE 1 TABLETS BY MOUTH DAILY 90 tablet 3  . glucose blood (ONETOUCH VERIO) test strip Use as instructed; Check blood sugar twice daily (Patient taking differently: Use as instructed; Check blood sugar twice daily) 100 each 12  . [name] 50-1000 MG tablet TAKE 1 TABLET BY MOUTH TWICE DAILY WITH A MEAL 180 tablet 3  . lisinopril-hydrochlorothiazide (PRINZIDE,ZESTORETIC) 20-12.5 MG tablet TAKE 2 TABLETS BY MOUTH EVERY MORNING 60 tablet 11  . meloxicam (MOBIC) 15 MG tablet Take 15 mg by mouth daily.     . metoprolol succinate (TOPROL-XL) 25 MG 24 hr tablet Take 1 tablet (25 mg total) by mouth daily. 30 tablet 11  . pioglitazone (ACTOS) 45 MG tablet TAKE 1 TABLET(45 MG) BY MOUTH DAILY 30 tablet 12  . tamoxifen (NOLVADEX) 20 MG tablet Take 1 tablet (20 mg total) by mouth daily. 30 tablet 11   No current facility-administered medications for this visit.     OBJECTIVE: Vitals:   09/19/16 1524  BP: (!) 141/80  Pulse: 71  Resp: 20  Temp: 97.2 F (36.2 C)     Body mass index is 45.35 kg/m.    ECOG FS:0 - Asymptomatic  General: Well-developed, well-nourished, no acute distress. Eyes: Pink conjunctiva, anicteric sclera. Breasts: Patient declined breast exam today. Lungs: Clear to auscultation bilaterally. Heart: Regular rate and rhythm. No rubs, murmurs, or gallops. Abdomen: Soft, nontender, nondistended. No organomegaly noted, normoactive  bowel sounds. Musculoskeletal: No edema, cyanosis, or clubbing. Neuro: Alert, answering all questions appropriately. Cranial nerves grossly intact. Skin: No rashes or petechiae noted. Psych: Normal affect.   LAB RESULTS:  Lab Results  Component Value Date   NA 140 07/11/2016   K 3.8 07/11/2016   CL 106 07/11/2016   CO2 25 07/11/2016   GLUCOSE 155 (H) 07/11/2016   BUN 26 (H) 07/11/2016   CREATININE 1.14 (H) 07/11/2016   CALCIUM 9.4 07/11/2016   PROT 7.1 03/03/2016   ALBUMIN 4.2 03/03/2016   AST 15 03/03/2016   ALT 7 03/03/2016   ALKPHOS 65 03/03/2016   BILITOT 0.5 03/03/2016   GFRNONAA 48 (L) 07/11/2016   GFRAA 56 (L) 07/11/2016    Lab Results  Component Value Date   WBC 5.9 09/13/2016   NEUTROABS 3.2 10/05/2015   HGB 10.7 (L) 09/13/2016   HCT 32.6 (L) 09/13/2016   MCV 83.3  09/13/2016   PLT 226 09/13/2016     STUDIES: No results found.  ASSESSMENT: Right breast high grade DCIS.  PLAN:    1. Right breast high grade DCIS: Final pathology was reviewed independently as also discussed at breast case conference. Patient has close margins less than 0.5 mm. Patient was given the option for reexcision to obtain better margins or proceed directly with XRT. She declined reexcision and will complete adjuvant XRT later this week. Patient was given a prescription for tamoxifen which she will be quick required to take for total 5 years completing in May 2023. Return to clinic in 3 months for further evaluation.   Approximately 30 minutes was spent in discussion of which greater than 50% was consultation.  Patient expressed understanding and was in agreement with this plan. She also understands that She can call clinic at any time with any questions, concerns, or complaints.   Cancer Staging Ductal carcinoma in situ (DCIS) of right breast Staging form: Breast, AJCC 8th Edition - Pathologic stage from 08/02/2016: Stage 0 (pTis (DCIS), pN0, cM0, ER: Positive, PR: Positive, HER2:  Negative) - Signed by Lloyd Huger, MD on 08/02/2016   Lloyd Huger, MD   09/24/2016 9:19 AM

## 2016-09-19 ENCOUNTER — Inpatient Hospital Stay (HOSPITAL_BASED_OUTPATIENT_CLINIC_OR_DEPARTMENT_OTHER): Payer: Medicare Other | Admitting: Oncology

## 2016-09-19 ENCOUNTER — Ambulatory Visit
Admission: RE | Admit: 2016-09-19 | Discharge: 2016-09-19 | Disposition: A | Discharge status: Home / Self Care | Payer: Medicare Other | Source: Ambulatory Visit | Attending: Radiation Oncology | Admitting: Radiation Oncology

## 2016-09-19 ENCOUNTER — Ambulatory Visit: Payer: Medicare Other

## 2016-09-19 VITALS — BP 141/80 | HR 71 | Temp 97.2°F | Resp 20 | Wt 256.0 lb

## 2016-09-19 DIAGNOSIS — Z79810 Long term (current) use of selective estrogen receptor modulators (SERMs): Secondary | ICD-10-CM | POA: Diagnosis not present

## 2016-09-19 DIAGNOSIS — Z7984 Long term (current) use of oral hypoglycemic drugs: Secondary | ICD-10-CM

## 2016-09-19 DIAGNOSIS — D0512 Intraductal carcinoma in situ of left breast: Secondary | ICD-10-CM | POA: Diagnosis not present

## 2016-09-19 DIAGNOSIS — E0590 Thyrotoxicosis, unspecified without thyrotoxic crisis or storm: Secondary | ICD-10-CM

## 2016-09-19 DIAGNOSIS — D0511 Intraductal carcinoma in situ of right breast: Secondary | ICD-10-CM

## 2016-09-19 DIAGNOSIS — Z85828 Personal history of other malignant neoplasm of skin: Secondary | ICD-10-CM

## 2016-09-19 DIAGNOSIS — M25562 Pain in left knee: Secondary | ICD-10-CM | POA: Diagnosis not present

## 2016-09-19 DIAGNOSIS — E785 Hyperlipidemia, unspecified: Secondary | ICD-10-CM

## 2016-09-19 DIAGNOSIS — E669 Obesity, unspecified: Secondary | ICD-10-CM | POA: Diagnosis not present

## 2016-09-19 DIAGNOSIS — M25561 Pain in right knee: Secondary | ICD-10-CM | POA: Diagnosis not present

## 2016-09-19 DIAGNOSIS — E039 Hypothyroidism, unspecified: Secondary | ICD-10-CM

## 2016-09-19 DIAGNOSIS — Z923 Personal history of irradiation: Secondary | ICD-10-CM

## 2016-09-19 DIAGNOSIS — E559 Vitamin D deficiency, unspecified: Secondary | ICD-10-CM

## 2016-09-19 DIAGNOSIS — I10 Essential (primary) hypertension: Secondary | ICD-10-CM

## 2016-09-19 DIAGNOSIS — Z170 Estrogen receptor positive status [ER+]: Secondary | ICD-10-CM

## 2016-09-19 DIAGNOSIS — Z79899 Other long term (current) drug therapy: Secondary | ICD-10-CM

## 2016-09-19 DIAGNOSIS — Z510 Encounter for antineoplastic radiation therapy: Secondary | ICD-10-CM | POA: Diagnosis not present

## 2016-09-19 DIAGNOSIS — E119 Type 2 diabetes mellitus without complications: Secondary | ICD-10-CM | POA: Diagnosis not present

## 2016-09-19 DIAGNOSIS — Z7982 Long term (current) use of aspirin: Secondary | ICD-10-CM

## 2016-09-19 MED ORDER — TAMOXIFEN CITRATE 20 MG PO TABS: 20.0000 mg | ORAL_TABLET | Freq: Every day | ORAL | 11 refills | Status: DC

## 2016-09-19 NOTE — Progress Notes (Signed)
Patient denies any concerns today.  

## 2016-09-20 ENCOUNTER — Ambulatory Visit
Admission: RE | Admit: 2016-09-20 | Discharge: 2016-09-20 | Disposition: A | Discharge status: Home / Self Care | Payer: Medicare Other | Source: Ambulatory Visit | Attending: Radiation Oncology | Admitting: Radiation Oncology

## 2016-09-20 ENCOUNTER — Ambulatory Visit: Payer: Medicare Other

## 2016-09-20 DIAGNOSIS — Z510 Encounter for antineoplastic radiation therapy: Secondary | ICD-10-CM | POA: Diagnosis not present

## 2016-09-20 DIAGNOSIS — Z170 Estrogen receptor positive status [ER+]: Secondary | ICD-10-CM | POA: Diagnosis not present

## 2016-09-20 DIAGNOSIS — M25562 Pain in left knee: Secondary | ICD-10-CM | POA: Diagnosis not present

## 2016-09-20 DIAGNOSIS — D0512 Intraductal carcinoma in situ of left breast: Secondary | ICD-10-CM | POA: Diagnosis not present

## 2016-09-20 DIAGNOSIS — E669 Obesity, unspecified: Secondary | ICD-10-CM | POA: Diagnosis not present

## 2016-09-20 DIAGNOSIS — M25561 Pain in right knee: Secondary | ICD-10-CM | POA: Diagnosis not present

## 2016-09-21 ENCOUNTER — Ambulatory Visit: Payer: Medicare Other

## 2016-09-22 ENCOUNTER — Ambulatory Visit: Payer: Medicare Other

## 2016-09-23 ENCOUNTER — Ambulatory Visit: Payer: Medicare Other

## 2016-09-27 ENCOUNTER — Ambulatory Visit: Payer: Medicare Other

## 2016-09-28 ENCOUNTER — Ambulatory Visit: Payer: Medicare Other

## 2016-09-29 ENCOUNTER — Ambulatory Visit: Payer: Medicare Other

## 2016-09-30 ENCOUNTER — Ambulatory Visit: Payer: Medicare Other

## 2016-10-03 ENCOUNTER — Ambulatory Visit: Payer: Medicare Other

## 2016-10-04 ENCOUNTER — Encounter: Payer: Self-pay | Admitting: Family Medicine

## 2016-10-04 ENCOUNTER — Ambulatory Visit: Payer: Medicare Other

## 2016-10-14 DIAGNOSIS — H524 Presbyopia: Secondary | ICD-10-CM | POA: Diagnosis not present

## 2016-10-14 DIAGNOSIS — E113293 Type 2 diabetes mellitus with mild nonproliferative diabetic retinopathy without macular edema, bilateral: Secondary | ICD-10-CM | POA: Diagnosis not present

## 2016-10-14 DIAGNOSIS — H01022 Squamous blepharitis right lower eyelid: Secondary | ICD-10-CM | POA: Diagnosis not present

## 2016-10-14 DIAGNOSIS — H01025 Squamous blepharitis left lower eyelid: Secondary | ICD-10-CM | POA: Diagnosis not present

## 2016-10-14 DIAGNOSIS — H2513 Age-related nuclear cataract, bilateral: Secondary | ICD-10-CM | POA: Diagnosis not present

## 2016-10-17 LAB — HM DIABETES EYE EXAM

## 2016-10-18 DIAGNOSIS — M1711 Unilateral primary osteoarthritis, right knee: Secondary | ICD-10-CM | POA: Diagnosis not present

## 2016-10-24 ENCOUNTER — Ambulatory Visit (INDEPENDENT_AMBULATORY_CARE_PROVIDER_SITE_OTHER): Payer: Medicare Other | Admitting: Family Medicine

## 2016-10-24 ENCOUNTER — Encounter: Payer: Self-pay | Admitting: Family Medicine

## 2016-10-24 VITALS — BP 138/70 | HR 79 | Temp 98.0°F | Resp 16 | Wt 254.0 lb

## 2016-10-24 DIAGNOSIS — I10 Essential (primary) hypertension: Secondary | ICD-10-CM

## 2016-10-24 DIAGNOSIS — R600 Localized edema: Secondary | ICD-10-CM

## 2016-10-24 DIAGNOSIS — E119 Type 2 diabetes mellitus without complications: Secondary | ICD-10-CM | POA: Diagnosis not present

## 2016-10-24 LAB — POCT GLYCOSYLATED HEMOGLOBIN (HGB A1C)
ESTIMATED AVERAGE GLUCOSE: 137
HEMOGLOBIN A1C: 6.4

## 2016-10-24 MED ORDER — METOPROLOL SUCCINATE ER 25 MG PO TB24: 50.0000 mg | ORAL_TABLET | Freq: Every day | ORAL | 11 refills | Status: DC

## 2016-10-24 NOTE — Progress Notes (Signed)
Patient: Carol Ramsey Female    DOB: July 25, 1945   71 y.o.   MRN: [id number] Visit Date: 10/24/2016  Today's Provider: [name], MD   Chief Complaint  Patient presents with  . Hypertension  . Diabetes   Subjective:    HPI  Diabetes Mellitus Type II, Follow-up:   Lab Results  Component Value Date   HGBA1C 7.0 07/14/2016   HGBA1C 7.1 (H) 07/11/2016   HGBA1C 6.4 03/03/2016    Last seen for diabetes 3 months ago.  Management since then includes no changes. She reports good compliance with treatment. She is not having side effects.  Current symptoms include none and have been stable. Home blood sugar records: fasting range: 80-100  Episodes of hypoglycemia? yes - 74   Current Insulin Regimen: none Most Recent Eye Exam: 10/14/2016 Weight trend: stable Prior visit with dietician: no Current diet: in general, an "unhealthy" diet Current exercise: none  Pertinent Labs:    Component Value Date/Time   CHOL 115 10/05/2015 1059   TRIG 109 10/05/2015 1059   HDL 55 10/05/2015 1059   LDLCALC 38 10/05/2015 1059   CREATININE 1.14 (H) 07/11/2016 1001   CREATININE 1.17 08/31/2011 1039    Wt Readings from Last 3 Encounters:  10/24/16 254 lb (115.2 kg)  09/19/16 256 lb (116.1 kg)  08/01/16 252 lb 15.7 oz (114.8 kg)    ------------------------------------------------------------------------  Hypertension, follow-up:  BP Readings from Last 3 Encounters:  09/19/16 (!) 141/80  08/01/16 137/67  07/28/16 136/71    She was last seen for hypertension 3 months ago.  BP at that visit was 141/81. Management since that visit includes adding Metoprolol 25mg  daily. She reports good compliance with treatment. She is not having side effects.  She is not exercising. She is adherent to low salt diet.   Outside blood pressures are not being checked. She is experiencing none.  Patient denies chest pain, chest pressure/discomfort, claudication, dyspnea, exertional  chest pressure/discomfort, fatigue, irregular heart beat, near-syncope, orthopnea, palpitations, paroxysmal nocturnal dyspnea, syncope and tachypnea.   Cardiovascular risk factors include advanced age (older than 8 for men, 66 for women), diabetes mellitus and hypertension.  Use of agents associated with hypertension: NSAIDS.     Weight trend: stable Wt Readings from Last 3 Encounters:  10/24/16 254 lb (115.2 kg)  09/19/16 256 lb (116.1 kg)  08/01/16 252 lb 15.7 oz (114.8 kg)    Current diet: in general, an "unhealthy" diet  ------------------------------------------------------------------------     Allergies  Allergen Reactions  . Gabapentin Swelling  . Quinine Nausea Only and Nausea And Vomiting  . Amoxicillin Itching and Rash    Has patient had a PCN reaction causing immediate rash, facial/tongue/throat swelling, SOB or lightheadedness with hypotension: No Has patient had a PCN reaction causing severe rash involving mucus membranes or skin necrosis: No Has patient had a PCN reaction that required hospitalization No Has patient had a PCN reaction occurring within the last 10 years: No If all of the above answers are "NO", then may proceed with Cephalosporin use.   . Dilaudid  [Hydromorphone Hcl] Rash  . Etodolac Rash  . Hydrochlorothiazide Rash  . Hydrocodone Rash  . Hydromorphone Rash  . Sulfa Antibiotics Rash     Current Outpatient Prescriptions:  .  acetaminophen (TYLENOL) 500 MG tablet, Take 500 mg by mouth every 6 (six) hours as needed for mild pain or moderate pain., Disp: , Rfl:  .  amLODipine (NORVASC) 5 MG tablet,  TAKE 1 TABLET BY MOUTH EVERY DAY, Disp: 90 tablet, Rfl: 3 .  aspirin 81 MG tablet, Take 81 mg by mouth daily. Hasn't taken in 1 week, Disp: , Rfl:  .  atorvastatin (LIPITOR) 10 MG tablet, Take 10 mg by mouth daily at 6 PM. , Disp: , Rfl:  .  Cholecalciferol (VITAMIN D) 2000 units CAPS, Take 2,000 Units by mouth daily. , Disp: , Rfl:  .  glipiZIDE  (GLUCOTROL XL) 5 MG 24 hr tablet, TAKE 1 TABLETS BY MOUTH DAILY, Disp: 90 tablet, Rfl: 3 .  glucose blood (ONETOUCH VERIO) test strip, Use as instructed; Check blood sugar twice daily (Patient taking differently: Use as instructed; Check blood sugar twice daily), Disp: 100 each, Rfl: 12 .  [name] 50-1000 MG tablet, TAKE 1 TABLET BY MOUTH TWICE DAILY WITH A MEAL, Disp: 180 tablet, Rfl: 3 .  lisinopril-hydrochlorothiazide (PRINZIDE,ZESTORETIC) 20-12.5 MG tablet, TAKE 2 TABLETS BY MOUTH EVERY MORNING, Disp: 60 tablet, Rfl: 11 .  meloxicam (MOBIC) 15 MG tablet, Take 15 mg by mouth daily. , Disp: , Rfl:  .  metoprolol succinate (TOPROL-XL) 25 MG 24 hr tablet, Take 1 tablet (25 mg total) by mouth daily., Disp: 30 tablet, Rfl: 11 .  pioglitazone (ACTOS) 45 MG tablet, TAKE 1 TABLET(45 MG) BY MOUTH DAILY, Disp: 30 tablet, Rfl: 12 .  tamoxifen (NOLVADEX) 20 MG tablet, Take 1 tablet (20 mg total) by mouth daily., Disp: 30 tablet, Rfl: 11  Review of Systems  Constitutional: Negative for appetite change, chills, fatigue and fever.  Eyes: Negative.   Respiratory: Negative for chest tightness and shortness of breath.   Cardiovascular: Positive for leg swelling (in ankles and feet). Negative for chest pain and palpitations.  Gastrointestinal: Negative for abdominal pain, nausea and vomiting.  Endocrine: Negative.   Allergic/Immunologic: Negative.   Neurological: Negative for dizziness and weakness.  Hematological: Negative.   Psychiatric/Behavioral: Negative.     Social History  Substance Use Topics  . Smoking status: Never Smoker  . Smokeless tobacco: Never Used  . Alcohol use No   Objective:   BP 138/70 (BP Location: Left Arm, Patient Position: Sitting, Cuff Size: Large)   Pulse 79   Temp 98 F (36.7 C) (Oral)   Resp 16   Wt 254 lb (115.2 kg)   SpO2 98%   BMI 44.99 kg/m  Vitals:   10/24/16 0957  Weight: 254 lb (115.2 kg)     Physical Exam  Constitutional: She is oriented to person,  place, and time. She appears well-developed and well-nourished.  HENT:  Head: Normocephalic and atraumatic.  Right Ear: External ear normal.  Left Ear: External ear normal.  Nose: Nose normal.  Eyes: Conjunctivae are normal. No scleral icterus.  Neck: No thyromegaly present.  Cardiovascular: Normal rate, regular rhythm and normal heart sounds.   Pulmonary/Chest: Effort normal and breath sounds normal.  Abdominal: Soft.  Musculoskeletal: She exhibits edema.  Mild pedal and ankle edema.  Neurological: She is alert and oriented to person, place, and time.  Skin: Skin is dry.  Psychiatric: She has a normal mood and affect. Her behavior is normal. Judgment and thought content normal.        Assessment & Plan:      1. Type 2 diabetes mellitus without complication, without long-term current use of insulin ([name]) Stable. No changes.  - POCT HgB A1C  2. Essential (primary) hypertension Improved but patient having some swelling in ankles and feet. Will increase Metoprolol to 50mg  daily and discontinue Amlodipine.   -  metoprolol succinate (TOPROL-XL) 25 MG 24 hr tablet; Take 2 tablets (50 mg total) by mouth daily.  Dispense: 30 tablet; Refill: 11  3. Lower Extremity edema Will increase Metoprolol to 50mg  daily and discontinue Amlodipine. Follow up in 1 month.  4. Morbid obesity  HPI, Exam and A&P transcribed by Meyer Cory. CMA under direction and in the presence of [name], MD.       I have done the exam and reviewed the above chart and it is accurate to the best of my knowledge. Development worker, community has been used in this note in any air is in the dictation or transcription are unintentional.  [name], MD  [place]

## 2016-10-24 NOTE — Patient Instructions (Signed)
Increase Metoprolol to 50mg  daily and discontinue Amlodipine. Follow up in 1 month.

## 2016-10-27 ENCOUNTER — Ambulatory Visit: Payer: Medicare Other | Admitting: Oncology

## 2016-11-03 ENCOUNTER — Encounter: Payer: Self-pay | Admitting: Radiation Oncology

## 2016-11-03 ENCOUNTER — Ambulatory Visit
Admission: RE | Admit: 2016-11-03 | Discharge: 2016-11-03 | Disposition: A | Discharge status: Home / Self Care | Payer: Medicare Other | Source: Ambulatory Visit | Attending: Radiation Oncology | Admitting: Radiation Oncology

## 2016-11-03 VITALS — BP 150/76 | HR 55 | Temp 97.8°F | Resp 20 | Wt 252.0 lb

## 2016-11-03 DIAGNOSIS — Z923 Personal history of irradiation: Secondary | ICD-10-CM | POA: Diagnosis not present

## 2016-11-03 DIAGNOSIS — M549 Dorsalgia, unspecified: Secondary | ICD-10-CM | POA: Insufficient documentation

## 2016-11-03 DIAGNOSIS — Z79810 Long term (current) use of selective estrogen receptor modulators (SERMs): Secondary | ICD-10-CM | POA: Diagnosis not present

## 2016-11-03 DIAGNOSIS — Z170 Estrogen receptor positive status [ER+]: Secondary | ICD-10-CM | POA: Insufficient documentation

## 2016-11-03 DIAGNOSIS — D0511 Intraductal carcinoma in situ of right breast: Secondary | ICD-10-CM | POA: Insufficient documentation

## 2016-11-03 NOTE — Progress Notes (Signed)
Radiation Oncology Follow up Note  Name: Carol Ramsey   Date:   11/03/2016 MRN:  [id number] DOB: 1946-04-16    This 71 y.o. female presents to the clinic today for one-month follow-up status post whole breast radiation to her right breast for ductal carcinoma in situ ER/PR positive.  REFERRING PROVIDER: Jerrol Banana.,*  HPI: Patient is a 71 year old female now out 1 month having completed whole breast radiation to her right breast status post wide local excision for ER/PR positive ductal carcinoma in situ. She is seen today in routine follow-up is doing fairly well. She states she's been having some back pain. She was just started on tamoxifen. She specifically denies breast tenderness cough or bone pain.  COMPLICATIONS OF TREATMENT: none  FOLLOW UP COMPLIANCE: keeps appointments   PHYSICAL EXAM:  BP (!) 150/76   Pulse (!) 55   Temp 97.8 F (36.6 C)   Resp 20   Wt 251 lb 15.8 oz (114.3 kg)   BMI 44.64 kg/m  Lungs are clear to A&P cardiac examination essentially unremarkable with regular rate and rhythm. No dominant mass or nodularity is noted in either breast in 2 positions examined. Incision is well-healed. No axillary or supraclavicular adenopathy is appreciated. Cosmetic result is excellent. Still some slight hyperpigmentation of her right breast. Breasts are large and pendulous. Well-developed well-nourished patient in NAD. HEENT reveals PERLA, EOMI, discs not visualized.  Oral cavity is clear. No oral mucosal lesions are identified. Neck is clear without evidence of cervical or supraclavicular adenopathy. Lungs are clear to A&P. Cardiac examination is essentially unremarkable with regular rate and rhythm without murmur rub or thrill. Abdomen is benign with no organomegaly or masses noted. Motor sensory and DTR levels are equal and symmetric in the upper and lower extremities. Cranial nerves II through XII are grossly intact. Proprioception is intact. No peripheral adenopathy  or edema is identified. No motor or sensory levels are noted. Crude visual fields are within normal range.  RADIOLOGY RESULTS: No current films for review  PLAN: Present time she is recovering nicely from her radiation therapy treatments. I've assured her the radiation is nothing to do with her back pain although there is sometimes exacerbation of arthritis with tamoxifen. I've asked her to address that with her medical oncologist. Otherwise I'm please were overall progress. I've asked to see her back in 4-5 months for follow-up. Patient is to call sooner with any concerns.  I would like to take this opportunity to thank you for allowing me to participate in the care of your patient.Armstead Peaks., MD

## 2016-11-07 ENCOUNTER — Telehealth: Payer: Self-pay | Admitting: *Deleted

## 2016-11-07 NOTE — Telephone Encounter (Signed)
Patient states she thinks the Tamoxifen is making her kn ees and back worse. Advised per VO Dr Grayland Ormond to stop med times 1 week and call back next Monday to see how she is doing. Patient repeated back to me

## 2016-11-21 ENCOUNTER — Telehealth: Payer: Self-pay

## 2016-11-21 NOTE — Telephone Encounter (Signed)
Pt called that she stopped her tamoxifen for a week. And she is wanting to change it to something else. Call received at [facility] office.

## 2016-11-22 NOTE — Telephone Encounter (Signed)
Thank you. I will further discuss with her at her appt on 8/16.  -Tim

## 2016-11-23 ENCOUNTER — Ambulatory Visit (INDEPENDENT_AMBULATORY_CARE_PROVIDER_SITE_OTHER): Payer: Medicare Other | Admitting: Family Medicine

## 2016-11-23 ENCOUNTER — Encounter: Payer: Self-pay | Admitting: Family Medicine

## 2016-11-23 VITALS — BP 142/64 | HR 62 | Temp 97.9°F | Resp 16 | Wt 250.0 lb

## 2016-11-23 DIAGNOSIS — M5440 Lumbago with sciatica, unspecified side: Secondary | ICD-10-CM | POA: Diagnosis not present

## 2016-11-23 DIAGNOSIS — E119 Type 2 diabetes mellitus without complications: Secondary | ICD-10-CM

## 2016-11-23 DIAGNOSIS — G8929 Other chronic pain: Secondary | ICD-10-CM

## 2016-11-23 DIAGNOSIS — D0511 Intraductal carcinoma in situ of right breast: Secondary | ICD-10-CM

## 2016-11-23 DIAGNOSIS — I10 Essential (primary) hypertension: Secondary | ICD-10-CM | POA: Diagnosis not present

## 2016-11-23 MED ORDER — METOPROLOL SUCCINATE ER 50 MG PO TB24: 50.0000 mg | ORAL_TABLET | Freq: Every day | ORAL | 3 refills | Status: DC

## 2016-11-23 NOTE — Progress Notes (Signed)
Subjective:  HPI  Hypertension, follow-up:  BP Readings from Last 3 Encounters:  11/23/16 (!) 142/64  11/03/16 (!) 150/76  10/24/16 138/70    She was last seen for hypertension 1 months ago.  BP at that visit was 142/64. Management since that visit includes discontinued Amlodipine and double Metoprolol to 50 mg daily. She reports good compliance with treatment. She is not having side effects.  Patient denies chest pain, chest pressure/discomfort, claudication, dyspnea, exertional chest pressure/discomfort, fatigue, irregular heart beat, lower extremity edema, near-syncope, orthopnea, palpitations, paroxysmal nocturnal dyspnea, syncope and tachypnea.    Wt Readings from Last 3 Encounters:  11/23/16 250 lb (113.4 kg)  11/03/16 251 lb 15.8 oz (114.3 kg)  10/24/16 254 lb (115.2 kg)    ------------------------------------------------------------------------ Pt reports that her back has been getting worse. She reports that it started about 2 weeks after she started Tamoxifen. She read that it can cause arthritis to be worse. She has to wait 4 weeks to see Dr. Massie Maroon again. She is hoping he will change it. She reports that her knees feels stiff also. She says her back feels very tight, so that she can not stand up straight.     Prior to Admission medications   Medication Sig Start Date End Date Taking? Authorizing Provider  acetaminophen (TYLENOL) 500 MG tablet Take 500 mg by mouth every 6 (six) hours as needed for mild pain or moderate pain.    [provider]  amLODipine (NORVASC) 5 MG tablet TK 1 T PO QD 09/10/16   [provider]  aspirin 81 MG tablet Take 81 mg by mouth daily. Hasn't taken in 1 week 08/30/11   [provider]  atorvastatin (LIPITOR) 10 MG tablet Take 10 mg by mouth daily at 6 PM.     [provider]  Cholecalciferol (VITAMIN D) 2000 units CAPS Take 2,000 Units by mouth daily.     [provider]  glipiZIDE (GLUCOTROL  XL) 5 MG 24 hr tablet TAKE 1 TABLETS BY MOUTH DAILY 07/18/16   Byrnett, Forest Gleason, MD  glucose blood (ONETOUCH VERIO) test strip Use as instructed; Check blood sugar twice daily Patient taking differently: Use as instructed; Check blood sugar twice daily 05/22/15   Jerrol Banana., MD  [name] 50-1000 MG tablet TAKE 1 TABLET BY MOUTH TWICE DAILY WITH A MEAL 08/18/16   Jerrol Banana., MD  lisinopril-hydrochlorothiazide [facility]) 20-12.5 MG tablet TAKE 2 TABLETS BY MOUTH EVERY MORNING 04/06/16   Jerrol Banana., MD  meloxicam (MOBIC) 15 MG tablet Take 15 mg by mouth daily.  06/06/16   [provider]  metoprolol succinate (TOPROL-XL) 25 MG 24 hr tablet Take 2 tablets (50 mg total) by mouth daily. 10/24/16   Jerrol Banana., MD  pioglitazone (ACTOS) 45 MG tablet TAKE 1 TABLET(45 MG) BY MOUTH DAILY 11/16/15   Jerrol Banana., MD  tamoxifen (NOLVADEX) 20 MG tablet Take 1 tablet (20 mg total) by mouth daily. 09/19/16   Lloyd Huger, MD    Patient Active Problem List   Diagnosis Date Noted  . CKD (chronic kidney disease) stage 3, GFR 30-59 ml/min 07/21/2016  . 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 LVH (left ventricular hypertrophy) due to hypertensive disease, without heart failure 01/19/2016  . Pulmonary hypertension ([place]) 02/10/2015  . Acquired hypothyroidism 09/17/2014  . Adaptation reaction 09/17/2014  . Absolute anemia 09/17/2014  . Cardiac dysrhythmia 09/17/2014  . Arthropathia  09/17/2014  . 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 Diabetes mellitus, type 2 ([place]) 09/17/2014  . Essential (primary) hypertension 09/17/2014  . HLD (hyperlipidemia) 09/17/2014  . Extreme obesity 09/17/2014  . Vitamin D deficiency 09/17/2014  . Beat, premature ventricular 12/11/2013  . OBESITY 11/14/2007  . ACHILLES BURSITIS OR TENDINITIS 11/14/2007  . BURSITIS, CALCANEAL 06/26/2007    Past Medical History:    Diagnosis Date  . Anemia   . Arthritis   . Breast cancer ([place]) 06/28/2016   right breast DCIS grade 3  . 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 Diabetes mellitus without complication ([place])   . Hyperlipidemia   . Hypertension   . Hyperthyroidism   . Obesity   . Thyroid disease    hyperthyroidism  . Vitamin D deficiency     Social History   Social History  . Marital status: Married    Spouse name: N/A  . Number of children: N/A  . Years of education: N/A   Occupational History  . Not on file.   Social History Main Topics  . Smoking status: Never Smoker  . Smokeless tobacco: Never Used  . Alcohol use No  . Drug use: No  . Sexual activity: No   Other Topics Concern  . Not on file   Social History Narrative  . No narrative on file    Allergies  Allergen Reactions  . Gabapentin Swelling  . Quinine Nausea Only and Nausea And Vomiting  . Amoxicillin Itching and Rash    Has patient had a PCN reaction causing immediate rash, facial/tongue/throat swelling, SOB or lightheadedness with hypotension: No Has patient had a PCN reaction causing severe rash involving mucus membranes or skin necrosis: No Has patient had a PCN reaction that required hospitalization No Has patient had a PCN reaction occurring within the last 10 years: No If all of the above answers are "NO", then may proceed with Cephalosporin use.   . Dilaudid  [Hydromorphone Hcl] Rash  . Etodolac Rash  . Hydrochlorothiazide Rash  . Hydrocodone Rash  . Hydromorphone Rash  . Sulfa Antibiotics Rash    Review of Systems  Constitutional: Negative.   HENT: Negative.   Eyes: Negative.   Respiratory: Negative.   Cardiovascular: Negative.   Gastrointestinal: Negative.   Genitourinary: Negative.   Musculoskeletal: Positive for back pain and joint pain.  Skin: Negative.   Neurological: Negative.   Endo/Heme/Allergies: Negative.   Psychiatric/Behavioral: Negative.     Immunization History  Administered  Date(s) Administered  . Influenza, High Dose Seasonal PF 03/03/2016  . Pneumococcal Conjugate-13 04/13/2015  . Pneumococcal Polysaccharide-23 02/17/2007, 02/27/2012  . Td 10/21/2003  . Tdap 05/11/2016  . Zoster 11/08/2010    Objective:  BP (!) 142/64 (BP Location: Left Arm, Patient Position: Sitting, Cuff Size: Large)   Pulse 62   Temp 97.9 F (36.6 C) (Oral)   Resp 16   Wt 250 lb (113.4 kg)   BMI 44.29 kg/m   Physical Exam  Constitutional: She is oriented to person, place, and time and well-developed, well-nourished, and in no distress.  Eyes: Pupils are equal, round, and reactive to light. Conjunctivae and EOM are normal.  Neck: Normal range of motion. Neck supple.  Cardiovascular: Normal rate, regular rhythm, normal heart sounds and intact distal pulses.   Pulmonary/Chest: Effort normal and breath sounds normal.  Musculoskeletal: Normal range of motion. She exhibits tenderness (upper spine and right SI joint).  Neurological: She is alert  and oriented to person, place, and time. She has normal reflexes. Gait normal. GCS score is 15.  Skin: Skin is warm and dry.  Psychiatric: Mood, memory, affect and judgment normal.    Lab Results  Component Value Date   WBC 5.9 09/13/2016   HGB 10.7 (L) 09/13/2016   HCT 32.6 (L) 09/13/2016   PLT 226 09/13/2016   GLUCOSE 155 (H) 07/11/2016   CHOL 115 10/05/2015   TRIG 109 10/05/2015   HDL 55 10/05/2015   LDLCALC 38 10/05/2015   TSH 2.140 03/03/2016   INR 1.0 07/11/2011   HGBA1C 6.4 10/24/2016    CMP     Component Value Date/Time   NA 140 07/11/2016 1001   NA 137 03/03/2016 1156   NA 140 07/28/2011 0144   K 3.8 07/11/2016 1001   K 4.2 07/28/2011 0144   CL 106 07/11/2016 1001   CL 106 07/28/2011 0144   CO2 25 07/11/2016 1001   CO2 23 07/28/2011 0144   GLUCOSE 155 (H) 07/11/2016 1001   GLUCOSE 153 (H) 07/28/2011 0144   BUN 26 (H) 07/11/2016 1001   BUN 30 (H) 03/03/2016 1156   BUN 33 (H) 07/28/2011 0144   CREATININE 1.14  (H) 07/11/2016 1001   CREATININE 1.17 08/31/2011 1039   CALCIUM 9.4 07/11/2016 1001   CALCIUM 8.5 07/28/2011 0144   PROT 7.1 03/03/2016 1156   ALBUMIN 4.2 03/03/2016 1156   AST 15 03/03/2016 1156   ALT 7 03/03/2016 1156   ALKPHOS 65 03/03/2016 1156   BILITOT 0.5 03/03/2016 1156   GFRNONAA 48 (L) 07/11/2016 1001   GFRNONAA 49 (L) 08/31/2011 1039   GFRAA 56 (L) 07/11/2016 1001   GFRAA 57 (L) 08/31/2011 1039    Assessment and Plan :  1. Essential (primary) hypertension Improved. continue current dose of Metoprolol. Med refilled. - metoprolol succinate (TOPROL-XL) 50 MG 24 hr tablet; Take 1 tablet (50 mg total) by mouth daily.  Dispense: 90 tablet; Refill: 3  2. Ductal carcinoma in situ (DCIS) of right breast   3. Chronic low back pain with sciatica, sciatica laterality unspecified, unspecified back pain laterality Pt will stop Tamoxifen for 1 week and see if back pain is better and restart it after that week.   4. Type 2 diabetes mellitus without complication, without long-term current use of insulin ([place]) Follow up in 2 months for a1c. 5.Obesity  HPI, Exam, and A&P Transcribed under the direction and in the presence of Richard L. Cranford Mon, MD  Electronically Signed: Katina Dung, [place] MD [facility] 11/23/2016 3:13 PM

## 2016-11-23 NOTE — Patient Instructions (Signed)
Stop Tamoxifen for 1 week only then restart and see if you back gets better.    Back Exercises If you have pain in your back, do these exercises 2-3 times each day or as told by your doctor. When the pain goes away, do the exercises once each day, but repeat the steps more times for each exercise (do more repetitions). If you do not have pain in your back, do these exercises once each day or as told by your doctor. Exercises Single Knee to Chest  Do these steps 3-5 times in a row for each leg: 1. Lie on your back on a firm bed or the floor with your legs stretched out. 2. Bring one knee to your chest. 3. Hold your knee to your chest by grabbing your knee or thigh. 4. Pull on your knee until you feel a gentle stretch in your lower back. 5. Keep doing the stretch for 10-30 seconds. 6. Slowly let go of your leg and straighten it.  Pelvic Tilt  Do these steps 5-10 times in a row: 1. Lie on your back on a firm bed or the floor with your legs stretched out. 2. Bend your knees so they point up to the ceiling. Your feet should be flat on the floor. 3. Tighten your lower belly (abdomen) muscles to press your lower back against the floor. This will make your tailbone point up to the ceiling instead of pointing down to your feet or the floor. 4. Stay in this position for 5-10 seconds while you gently tighten your muscles and breathe evenly.  Cat-Cow  Do these steps until your lower back bends more easily: 1. Get on your hands and knees on a firm surface. Keep your hands under your shoulders, and keep your knees under your hips. You may put padding under your knees. 2. Let your head hang down, and make your tailbone point down to the floor so your lower back is round like the back of a cat. 3. Stay in this position for 5 seconds. 4. Slowly lift your head and make your tailbone point up to the ceiling so your back hangs low (sags) like the back of a cow. 5. Stay in this position for 5  seconds.  Press-Ups  Do these steps 5-10 times in a row: 1. Lie on your belly (face-down) on the floor. 2. Place your hands near your head, about shoulder-width apart. 3. While you keep your back relaxed and keep your hips on the floor, slowly straighten your arms to raise the top half of your body and lift your shoulders. Do not use your back muscles. To make yourself more comfortable, you may change where you place your hands. 4. Stay in this position for 5 seconds. 5. Slowly return to lying flat on the floor.  Bridges  Do these steps 10 times in a row: 1. Lie on your back on a firm surface. 2. Bend your knees so they point up to the ceiling. Your feet should be flat on the floor. 3. Tighten your butt muscles and lift your butt off of the floor until your waist is almost as high as your knees. If you do not feel the muscles working in your butt and the back of your thighs, slide your feet 1-2 inches farther away from your butt. 4. Stay in this position for 3-5 seconds. 5. Slowly lower your butt to the floor, and let your butt muscles relax.  If this exercise is too easy, try  doing it with your arms crossed over your chest. Belly Crunches  Do these steps 5-10 times in a row: 1. Lie on your back on a firm bed or the floor with your legs stretched out. 2. Bend your knees so they point up to the ceiling. Your feet should be flat on the floor. 3. Cross your arms over your chest. 4. Tip your chin a little bit toward your chest but do not bend your neck. 5. Tighten your belly muscles and slowly raise your chest just enough to lift your shoulder blades a tiny bit off of the floor. 6. Slowly lower your chest and your head to the floor.  Back Lifts Do these steps 5-10 times in a row: 1. Lie on your belly (face-down) with your arms at your sides, and rest your forehead on the floor. 2. Tighten the muscles in your legs and your butt. 3. Slowly lift your chest off of the floor while you keep  your hips on the floor. Keep the back of your head in line with the curve in your back. Look at the floor while you do this. 4. Stay in this position for 3-5 seconds. 5. Slowly lower your chest and your face to the floor.  Contact a doctor if:  Your back pain gets a lot worse when you do an exercise.  Your back pain does not lessen 2 hours after you exercise. If you have any of these problems, stop doing the exercises. Do not do them again unless your doctor says it is okay. Get help right away if:  You have sudden, very bad back pain. If this happens, stop doing the exercises. Do not do them again unless your doctor says it is okay. This information is not intended to replace advice given to you by your health care provider. Make sure you discuss any questions you have with your health care provider. Document Released: 05/21/2010 Document Revised: 09/24/2015 Document Reviewed: 06/12/2014 Elsevier Interactive Patient Education  [employer].

## 2016-11-23 NOTE — Telephone Encounter (Signed)
lvm per Dr Gary Fleet response.

## 2016-11-24 ENCOUNTER — Ambulatory Visit: Payer: Medicare Other | Admitting: Family Medicine

## 2016-12-14 NOTE — Progress Notes (Signed)
[facility]  [phone]) [phone] [phone]) [phone]  ID: Avanti L Shelvin OB: 03/26/1946  MR#: [id number]  ZYS#:[id number]  Patient Care Team: Jerrol Banana., MD as PCP - General (Family Medicine) Marry Guan, Laurice Record, MD as Consulting Physician (Orthopedic Surgery) Dasher, Rayvon Char, MD as Consulting Physician (Dermatology) Corey Skains, MD as Consulting Physician (Cardiology) Thelma Comp, OD as Consulting Physician (Optometry) Bary Castilla, Forest Gleason, MD (General Surgery)  CHIEF COMPLAINT: Right breast high grade DCIS  INTERVAL HISTORY: Patient returns to clinic today for follow-up for high grade DCIS of right breast. She was initiated on tamoxifen in May and reports tolerating it well with few side effects. She reports some hot flashes and complains that her chronic back pain is somewhat worse in the past 2 weeks. She has seen [facility] in the past at [facility] and is under the care of Dr. Rosanna Randy for primary care. She completed XRT 09/20/16 and continues to have some mild peeling of the breast. She uses aquaphor which seems to help. She is concerned of the effect of tamoxifen on cataracts which she was diagnosed with. She has no neurologic complaints. She denies any recent fevers or illnesses. She has a good appetite and denies weight loss. She has no chest pain or shortness of breath. She denies any nausea, vomiting, constipation, or diarrhea. She has no urinary complaints. Patient offers no specific complaints today.  REVIEW OF SYSTEMS:   Review of Systems  Constitutional: Negative.  Negative for fever, malaise/fatigue and weight loss.  Respiratory: Negative.  Negative for cough and shortness of breath.   Cardiovascular: Negative.  Negative for chest pain and leg swelling.  Gastrointestinal: Negative.  Negative for abdominal pain.  Genitourinary: Negative.   Musculoskeletal: Negative.   Skin: Negative.  Negative for rash.  Neurological: Negative.  Negative for  weakness.  Psychiatric/Behavioral: Negative.  The patient is not nervous/anxious.     As per HPI. Otherwise, a complete review of systems is negative.  PAST MEDICAL HISTORY: Past Medical History:  Diagnosis Date  . Anemia   . Arthritis   . Breast cancer ([place]) 06/28/2016   right breast DCIS grade 3  . 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 Diabetes mellitus without complication ([place])   . Hyperlipidemia   . Hypertension   . Hyperthyroidism   . Obesity   . Thyroid disease    hyperthyroidism  . Vitamin D deficiency     PAST SURGICAL HISTORY: Past Surgical History:  Procedure Laterality Date  . BREAST BIOPSY Right 06/28/2016   stereotactic biopsy - DUCTAL CARCINOMA IN SITU (DCIS), HIGH NUCLEAR GRADE   . JOINT REPLACEMENT Left    tkr  . KNEE SURGERY Left    arthroscopy  . MASTECTOMY, PARTIAL Right 07/18/2016   Procedure: MASTECTOMY PARTIAL;  Surgeon: Robert Bellow, MD;  Location: [facility] ORS;  Service: General;  Laterality: Right;  . SKIN CANCER EXCISION     face    FAMILY HISTORY: Family History  Problem Relation Age of Onset  . Hypertension Mother   . Cervical cancer Mother   . Heart disease Father   . Hypertension Father   . Lung cancer Father   . Lung cancer Sister   . Brain cancer Sister   . Diabetes Brother   . Heart disease Brother   . Hypertension Brother   . Stroke Brother     ADVANCED DIRECTIVES (Y/N):  N  HEALTH MAINTENANCE: Social History  Substance Use Topics  . Smoking  status: Never Smoker  . Smokeless tobacco: Never Used  . Alcohol use No     Colonoscopy:  PAP:  Bone density: 2013   Lipid panel:  Allergies  Allergen Reactions  . Gabapentin Swelling  . Quinine Nausea Only and Nausea And Vomiting  . Amoxicillin Itching and Rash    Has patient had a PCN reaction causing immediate rash, facial/tongue/throat swelling, SOB or lightheadedness with hypotension: No Has patient had a PCN reaction causing severe rash involving mucus membranes  or skin necrosis: No Has patient had a PCN reaction that required hospitalization No Has patient had a PCN reaction occurring within the last 10 years: No If all of the above answers are "NO", then may proceed with Cephalosporin use.   . Dilaudid  [Hydromorphone Hcl] Rash  . Etodolac Rash  . Hydrochlorothiazide Rash  . Hydrocodone Rash  . Hydromorphone Rash  . Sulfa Antibiotics Rash    Current Outpatient Prescriptions  Medication Sig Dispense Refill  . acetaminophen (TYLENOL) 500 MG tablet Take 500 mg by mouth every 6 (six) hours as needed for mild pain or moderate pain.    [name] aspirin 81 MG tablet Take 81 mg by mouth daily. Hasn't taken in 1 week    . atorvastatin (LIPITOR) 10 MG tablet Take 10 mg by mouth daily at 6 PM.     . Cholecalciferol (VITAMIN D) 2000 units CAPS Take 2,000 Units by mouth daily.     [name] glipiZIDE (GLUCOTROL XL) 5 MG 24 hr tablet TAKE 1 TABLETS BY MOUTH DAILY 90 tablet 3  . glucose blood (ONETOUCH VERIO) test strip Use as instructed; Check blood sugar twice daily (Patient taking differently: Use as instructed; Check blood sugar twice daily) 100 each 12  . [name] 50-1000 MG tablet TAKE 1 TABLET BY MOUTH TWICE DAILY WITH A MEAL 180 tablet 3  . lisinopril-hydrochlorothiazide (PRINZIDE,ZESTORETIC) 20-12.5 MG tablet TAKE 2 TABLETS BY MOUTH EVERY MORNING 60 tablet 11  . meloxicam (MOBIC) 15 MG tablet Take 15 mg by mouth daily.     . metoprolol succinate (TOPROL-XL) 50 MG 24 hr tablet Take 1 tablet (50 mg total) by mouth daily. 90 tablet 3  . pioglitazone (ACTOS) 45 MG tablet TAKE 1 TABLET(45 MG) BY MOUTH DAILY 30 tablet 12  . tamoxifen (NOLVADEX) 20 MG tablet Take 1 tablet (20 mg total) by mouth daily. 30 tablet 11   No current facility-administered medications for this visit.     OBJECTIVE: Vitals:   12/15/16 1032  BP: (!) 157/77  Pulse: (!) 58  Resp: 20  Temp: (!) 97.5 F (36.4 C)     Body mass index is 45.83 kg/m.    ECOG FS:1 - Symptomatic but completely  ambulatory  General: Obese, elderly female, in no acute distress. Eyes: Pink conjunctiva, anicteric sclera. Breasts: Patient declined breast exam today.  Lungs: Clear to auscultation bilaterally. Heart: Regular rate and rhythm. No rubs, murmurs, or gallops. Abdomen: Soft, nontender, nondistended. No organomegaly noted, normoactive bowel sounds. Musculoskeletal: 2+ edema BLE, no cyanosis, or clubbing. Well healed surgical scar  Neuro: Alert, answering all questions appropriately. Cranial nerves grossly intact. Skin: mild erythema of right breast. Skin intact.  Psych: Normal affect.   LAB RESULTS:  Lab Results  Component Value Date   NA 140 07/11/2016   K 3.8 07/11/2016   CL 106 07/11/2016   CO2 25 07/11/2016   GLUCOSE 155 (H) 07/11/2016   BUN 26 (H) 07/11/2016   CREATININE 1.14 (H) 07/11/2016   CALCIUM 9.4 07/11/2016  PROT 7.1 03/03/2016   ALBUMIN 4.2 03/03/2016   AST 15 03/03/2016   ALT 7 03/03/2016   ALKPHOS 65 03/03/2016   BILITOT 0.5 03/03/2016   GFRNONAA 48 (L) 07/11/2016   GFRAA 56 (L) 07/11/2016    Lab Results  Component Value Date   WBC 5.9 09/13/2016   NEUTROABS 3.2 10/05/2015   HGB 10.7 (L) 09/13/2016   HCT 32.6 (L) 09/13/2016   MCV 83.3 09/13/2016   PLT 226 09/13/2016     STUDIES: No results found.  ASSESSMENT: Right breast high grade DCIS.  PLAN:    1. Right breast high grade DCIS: Final pathology was reviewed independently as also discussed at breast case conference. Patient has close margins less than 0.5 mm. Patient was given the option for reexcision to obtain better margins or proceed directly with XRT. She declined reexcision and completed XRT 09/20/16. Continues to have skin peeling which Dr. Donella Stade reports may take several months to resolve. Tamoxifen was initiated and she will take for a total of 5 years completing in May 2023. Next mammogram due 06/28/2017. DEXA scan due now. Will defer to primary care for scheduling. Previous scan in 2013  demonstrated osteopenia in the femoral neck and progression from previous scans. Return to clinic in 3 months for further evaluation.  2. Cataracts: cataracts have been cautioned as a rare side effect of tamoxifen usage. Encouraged patient to seek regular eye exams and can discontinue tamoxifen if opthalmology feels there is relationship. Discussed follow up eye exam with opthalmology in 6 months to evaluate for possible progression or sooner if change in vision.  3. Chronic back pain: defer to primary care for workup and management  Approximately 30 minutes was spent in discussion of which greater than 50% was consultation.  Patient expressed understanding and was in agreement with this plan. She also understands that She can call clinic at any time with any questions, concerns, or complaints.   Cancer Staging Ductal carcinoma in situ (DCIS) of right breast Staging form: Breast, AJCC 8th Edition - Pathologic stage from 08/02/2016: Stage 0 (pTis (DCIS), pN0, cM0, ER: Positive, PR: Positive, HER2: Negative) - Signed by Lloyd Huger, MD on 08/02/2016  Beverely Risen. Zenia Resides, NP 12/15/16 1136 AM  Patient was seen and evaluated independently and I agree with the assessment and plan as dictated above. Patient also noted to have elevated blood pressures today. Continue treatment and monitoring per primary care.  Lloyd Huger, MD 12/16/16 1:03 PM

## 2016-12-15 ENCOUNTER — Inpatient Hospital Stay: Payer: Medicare Other | Attending: Oncology | Admitting: Oncology

## 2016-12-15 VITALS — BP 157/77 | HR 58 | Temp 97.5°F | Resp 20 | Wt 258.7 lb

## 2016-12-15 DIAGNOSIS — E559 Vitamin D deficiency, unspecified: Secondary | ICD-10-CM | POA: Insufficient documentation

## 2016-12-15 DIAGNOSIS — D0511 Intraductal carcinoma in situ of right breast: Secondary | ICD-10-CM

## 2016-12-15 DIAGNOSIS — D649 Anemia, unspecified: Secondary | ICD-10-CM

## 2016-12-15 DIAGNOSIS — E669 Obesity, unspecified: Secondary | ICD-10-CM | POA: Diagnosis not present

## 2016-12-15 DIAGNOSIS — Z808 Family history of malignant neoplasm of other organs or systems: Secondary | ICD-10-CM

## 2016-12-15 DIAGNOSIS — Z7984 Long term (current) use of oral hypoglycemic drugs: Secondary | ICD-10-CM

## 2016-12-15 DIAGNOSIS — E59 Dietary selenium deficiency: Secondary | ICD-10-CM

## 2016-12-15 DIAGNOSIS — E785 Hyperlipidemia, unspecified: Secondary | ICD-10-CM

## 2016-12-15 DIAGNOSIS — E119 Type 2 diabetes mellitus without complications: Secondary | ICD-10-CM | POA: Diagnosis not present

## 2016-12-15 DIAGNOSIS — Z170 Estrogen receptor positive status [ER+]: Secondary | ICD-10-CM | POA: Diagnosis not present

## 2016-12-15 DIAGNOSIS — M549 Dorsalgia, unspecified: Secondary | ICD-10-CM | POA: Diagnosis not present

## 2016-12-15 DIAGNOSIS — Z809 Family history of malignant neoplasm, unspecified: Secondary | ICD-10-CM | POA: Diagnosis not present

## 2016-12-15 DIAGNOSIS — I10 Essential (primary) hypertension: Secondary | ICD-10-CM

## 2016-12-15 DIAGNOSIS — R232 Flushing: Secondary | ICD-10-CM

## 2016-12-15 DIAGNOSIS — Z79810 Long term (current) use of selective estrogen receptor modulators (SERMs): Secondary | ICD-10-CM | POA: Diagnosis not present

## 2016-12-15 DIAGNOSIS — Z79899 Other long term (current) drug therapy: Secondary | ICD-10-CM | POA: Diagnosis not present

## 2016-12-15 DIAGNOSIS — Z801 Family history of malignant neoplasm of trachea, bronchus and lung: Secondary | ICD-10-CM | POA: Diagnosis not present

## 2016-12-15 DIAGNOSIS — E0590 Thyrotoxicosis, unspecified without thyrotoxic crisis or storm: Secondary | ICD-10-CM

## 2016-12-15 NOTE — Progress Notes (Signed)
Patient here today for follow up regarding DCIS. Patient reports worsening lower back pain. Patient has questions regarding lower back pain and cataracts being worsened by Tamoxifen.

## 2016-12-20 ENCOUNTER — Ambulatory Visit: Payer: Medicare Other | Admitting: Oncology

## 2016-12-26 ENCOUNTER — Other Ambulatory Visit: Payer: Self-pay | Admitting: Family Medicine

## 2017-01-05 DIAGNOSIS — Z85828 Personal history of other malignant neoplasm of skin: Secondary | ICD-10-CM | POA: Diagnosis not present

## 2017-01-05 DIAGNOSIS — D2272 Melanocytic nevi of left lower limb, including hip: Secondary | ICD-10-CM | POA: Diagnosis not present

## 2017-01-05 DIAGNOSIS — D2261 Melanocytic nevi of right upper limb, including shoulder: Secondary | ICD-10-CM | POA: Diagnosis not present

## 2017-01-05 DIAGNOSIS — X32XXXA Exposure to sunlight, initial encounter: Secondary | ICD-10-CM | POA: Diagnosis not present

## 2017-01-05 DIAGNOSIS — L570 Actinic keratosis: Secondary | ICD-10-CM | POA: Diagnosis not present

## 2017-01-05 DIAGNOSIS — D225 Melanocytic nevi of trunk: Secondary | ICD-10-CM | POA: Diagnosis not present

## 2017-01-05 DIAGNOSIS — D0461 Carcinoma in situ of skin of right upper limb, including shoulder: Secondary | ICD-10-CM | POA: Diagnosis not present

## 2017-01-05 DIAGNOSIS — D485 Neoplasm of uncertain behavior of skin: Secondary | ICD-10-CM | POA: Diagnosis not present

## 2017-01-25 DIAGNOSIS — N183 Chronic kidney disease, stage 3 (moderate): Secondary | ICD-10-CM | POA: Diagnosis not present

## 2017-01-25 DIAGNOSIS — I272 Pulmonary hypertension, unspecified: Secondary | ICD-10-CM | POA: Diagnosis not present

## 2017-01-25 DIAGNOSIS — R0609 Other forms of dyspnea: Secondary | ICD-10-CM | POA: Diagnosis not present

## 2017-01-25 DIAGNOSIS — I119 Hypertensive heart disease without heart failure: Secondary | ICD-10-CM | POA: Diagnosis not present

## 2017-01-25 DIAGNOSIS — I10 Essential (primary) hypertension: Secondary | ICD-10-CM | POA: Diagnosis not present

## 2017-01-25 DIAGNOSIS — E119 Type 2 diabetes mellitus without complications: Secondary | ICD-10-CM | POA: Diagnosis not present

## 2017-01-25 DIAGNOSIS — E782 Mixed hyperlipidemia: Secondary | ICD-10-CM | POA: Diagnosis not present

## 2017-01-26 ENCOUNTER — Encounter: Payer: Self-pay | Admitting: Family Medicine

## 2017-01-26 ENCOUNTER — Ambulatory Visit (INDEPENDENT_AMBULATORY_CARE_PROVIDER_SITE_OTHER): Payer: Medicare Other | Admitting: Family Medicine

## 2017-01-26 VITALS — BP 142/64 | HR 60 | Temp 98.1°F | Resp 16 | Ht 63.0 in | Wt 256.0 lb

## 2017-01-26 DIAGNOSIS — M5136 Other intervertebral disc degeneration, lumbar region: Secondary | ICD-10-CM | POA: Diagnosis not present

## 2017-01-26 DIAGNOSIS — E7800 Pure hypercholesterolemia, unspecified: Secondary | ICD-10-CM | POA: Diagnosis not present

## 2017-01-26 DIAGNOSIS — G8929 Other chronic pain: Secondary | ICD-10-CM | POA: Diagnosis not present

## 2017-01-26 DIAGNOSIS — E119 Type 2 diabetes mellitus without complications: Secondary | ICD-10-CM | POA: Diagnosis not present

## 2017-01-26 DIAGNOSIS — M545 Low back pain: Secondary | ICD-10-CM

## 2017-01-26 DIAGNOSIS — E039 Hypothyroidism, unspecified: Secondary | ICD-10-CM | POA: Diagnosis not present

## 2017-01-26 DIAGNOSIS — Z23 Encounter for immunization: Secondary | ICD-10-CM

## 2017-01-26 DIAGNOSIS — I10 Essential (primary) hypertension: Secondary | ICD-10-CM

## 2017-01-26 LAB — COMPREHENSIVE METABOLIC PANEL
AG Ratio: 1.2 (calc) (ref 1.0–2.5)
ALBUMIN MSPROF: 3.4 g/dL — AB (ref 3.6–5.1)
ALKALINE PHOSPHATASE (APISO): 44 U/L (ref 33–130)
ALT: 6 U/L (ref 6–29)
AST: 16 U/L (ref 10–35)
BILIRUBIN TOTAL: 0.6 mg/dL (ref 0.2–1.2)
BUN/Creatinine Ratio: 22 (calc) (ref 6–22)
BUN: 25 mg/dL (ref 7–25)
CALCIUM: 8.6 mg/dL (ref 8.6–10.4)
CHLORIDE: 105 mmol/L (ref 98–110)
CO2: 23 mmol/L (ref 20–32)
Creat: 1.16 mg/dL — ABNORMAL HIGH (ref 0.60–0.93)
GLOBULIN: 2.9 g/dL (ref 1.9–3.7)
GLUCOSE: 119 mg/dL — AB (ref 65–99)
POTASSIUM: 4.5 mmol/L (ref 3.5–5.3)
SODIUM: 138 mmol/L (ref 135–146)
TOTAL PROTEIN: 6.3 g/dL (ref 6.1–8.1)

## 2017-01-26 LAB — CBC WITH DIFFERENTIAL/PLATELET
Basophils Absolute: 49 cells/uL (ref 0–200)
Basophils Relative: 1 %
EOS ABS: 348 {cells}/uL (ref 15–500)
Eosinophils Relative: 7.1 %
HCT: 33.4 % — ABNORMAL LOW (ref 35.0–45.0)
Hemoglobin: 10.7 g/dL — ABNORMAL LOW (ref 11.7–15.5)
Lymphs Abs: 1240 cells/uL (ref 850–3900)
MCH: 27.6 pg (ref 27.0–33.0)
MCHC: 32 g/dL (ref 32.0–36.0)
MCV: 86.3 fL (ref 80.0–100.0)
MONOS PCT: 11.3 %
MPV: 11.7 fL (ref 7.5–12.5)
NEUTROS PCT: 55.3 %
Neutro Abs: 2710 cells/uL (ref 1500–7800)
PLATELETS: 210 10*3/uL (ref 140–400)
RBC: 3.87 10*6/uL (ref 3.80–5.10)
RDW: 13.7 % (ref 11.0–15.0)
TOTAL LYMPHOCYTE: 25.3 %
WBC: 4.9 10*3/uL (ref 3.8–10.8)
WBCMIX: 554 {cells}/uL (ref 200–950)

## 2017-01-26 LAB — LIPID PANEL
CHOL/HDL RATIO: 2.2 (calc) (ref <5.0)
Cholesterol: 107 mg/dL (ref <200)
HDL: 48 mg/dL — ABNORMAL LOW (ref >50)
LDL CHOLESTEROL (CALC): 43 mg/dL
Non-HDL Cholesterol (Calc): 59 mg/dL (calc) (ref <130)
Triglycerides: 81 mg/dL (ref <150)

## 2017-01-26 LAB — POCT GLYCOSYLATED HEMOGLOBIN (HGB A1C)
Est. average glucose Bld gHb Est-mCnc: 137
HEMOGLOBIN A1C: 6.4

## 2017-01-26 LAB — TSH: TSH: 2.49 m[IU]/L (ref 0.40–4.50)

## 2017-01-26 NOTE — Progress Notes (Signed)
Patient: Carol Ramsey Female    DOB: Carol Ramsey 04, 1947   71 y.o.   MRN: [id number] Visit Date: 01/26/2017  Today's Provider: [name], MD   Chief Complaint  Patient presents with  . Diabetes  . Hypertension  . Back Pain   Subjective:    HPI  Diabetes Mellitus Type II, Follow-up:   Lab Results  Component Value Date   HGBA1C 6.4 01/26/2017   HGBA1C 6.4 10/24/2016   HGBA1C 7.0 07/14/2016    Last seen for diabetes 3 months ago.  Management since then includes no changes. She reports good compliance with treatment. She is not having side effects.  Current symptoms include none and have been stable. Home blood sugar records: fasting range: 70s  Episodes of hypoglycemia? no   Current Insulin Regimen: none Most Recent Eye Exam: due Weight trend: stable Prior visit with dietician: no Current diet: well balanced Current exercise: none  Pertinent Labs:    Component Value Date/Time   CHOL 115 10/05/2015 1059   TRIG 109 10/05/2015 1059   HDL 55 10/05/2015 1059   LDLCALC 38 10/05/2015 1059   CREATININE 1.14 (H) 07/11/2016 1001   CREATININE 1.17 08/31/2011 1039    Wt Readings from Last 3 Encounters:  01/26/17 256 lb (116.1 kg)  12/15/16 258 lb 11.2 oz (117.3 kg)  11/23/16 250 lb (113.4 kg)      Hypertension, follow-up:  BP Readings from Last 3 Encounters:  01/26/17 (!) 142/64  12/15/16 (!) 157/77  11/23/16 (!) 142/64    She was last seen for hypertension 3 months ago.  BP at that visit was 142/64. Management since that visit includes no changes. She reports good compliance with treatment. She is not having side effects.  She is not exercising. She is adherent to low salt diet.   Outside blood pressures are checked occasionally. She is experiencing none.  Patient denies exertional chest pressure/discomfort and palpitations.   Cardiovascular risk factors include diabetes mellitus and dyslipidemia.   Weight trend: stable Wt Readings from Last  3 Encounters:  01/26/17 256 lb (116.1 kg)  12/15/16 258 lb 11.2 oz (117.3 kg)  11/23/16 250 lb (113.4 kg)    Current diet: well balanced   Back pain: Patient reports that she has had ongoing back pain for several months. On last visit, it was recommended that she hold the Tamoxifen X 1 week and see if her back pain improves. She reports that she saw Dr. Grayland Ramsey 1 week later, and he recommended that she continue Tamoxifen. She reports that she is currently taking Meloxicam which helps a little.  she has been seen by due physiatry and do neurosurgery for this. She was told that this is not a surgical issue.     Allergies  Allergen Reactions  . Gabapentin Swelling  . Quinine Nausea Only and Nausea And Vomiting  . Amoxicillin Itching and Rash    Has patient had a PCN reaction causing immediate rash, facial/tongue/throat swelling, SOB or lightheadedness with hypotension: No Has patient had a PCN reaction causing severe rash involving mucus membranes or skin necrosis: No Has patient had a PCN reaction that required hospitalization No Has patient had a PCN reaction occurring within the last 10 years: No If all of the above answers are "NO", then may proceed with Cephalosporin use.   . Dilaudid  [Hydromorphone Hcl] Rash  . Etodolac Rash  . Hydrochlorothiazide Rash  . Hydrocodone Rash  . Hydromorphone Rash  . Sulfa Antibiotics  Rash     Current Outpatient Prescriptions:  .  acetaminophen (TYLENOL) 500 MG tablet, Take 500 mg by mouth every 6 (six) hours as needed for mild pain or moderate pain., Disp: , Rfl:  .  aspirin 81 MG tablet, Take 81 mg by mouth daily. Hasn't taken in 1 week, Disp: , Rfl:  .  atorvastatin (LIPITOR) 10 MG tablet, Take 10 mg by mouth daily at 6 PM. , Disp: , Rfl:  .  Cholecalciferol (VITAMIN D) 2000 units CAPS, Take 2,000 Units by mouth daily. , Disp: , Rfl:  .  glipiZIDE (GLUCOTROL XL) 5 MG 24 hr tablet, TAKE 1 TABLETS BY MOUTH DAILY, Disp: 90 tablet, Rfl: 3 .   glucose blood (ONETOUCH VERIO) test strip, Use as instructed; Check blood sugar twice daily (Patient taking differently: Use as instructed; Check blood sugar twice daily), Disp: 100 each, Rfl: 12 .  [name] 50-1000 MG tablet, TAKE 1 TABLET BY MOUTH TWICE DAILY WITH A MEAL, Disp: 180 tablet, Rfl: 3 .  lisinopril-hydrochlorothiazide (PRINZIDE,ZESTORETIC) 20-12.5 MG tablet, TAKE 2 TABLETS BY MOUTH EVERY MORNING, Disp: 60 tablet, Rfl: 11 .  meloxicam (MOBIC) 15 MG tablet, Take 15 mg by mouth daily. , Disp: , Rfl:  .  metoprolol succinate (TOPROL-XL) 50 MG 24 hr tablet, Take 1 tablet (50 mg total) by mouth daily., Disp: 90 tablet, Rfl: 3 .  pioglitazone (ACTOS) 45 MG tablet, TAKE 1 TABLET(45 MG) BY MOUTH DAILY, Disp: 30 tablet, Rfl: 11 .  tamoxifen (NOLVADEX) 20 MG tablet, Take 1 tablet (20 mg total) by mouth daily., Disp: 30 tablet, Rfl: 11  Review of Systems  Constitutional: Positive for activity change and fatigue.  HENT: Negative.   Eyes: Negative.   Respiratory: Negative.   Cardiovascular: Negative for chest pain, palpitations and leg swelling.  Gastrointestinal: Negative.   Endocrine: Negative.   Musculoskeletal: Positive for arthralgias and back pain.  Skin: Negative.   Allergic/Immunologic: Positive for immunocompromised state. Negative for environmental allergies and food allergies.       Has breast cancer   Neurological: Negative.   Hematological: Negative.   Psychiatric/Behavioral: Negative.     Social History  Substance Use Topics  . Smoking status: Never Smoker  . Smokeless tobacco: Never Used  . Alcohol use No   Objective:   BP (!) 142/64 (BP Location: Left Arm, Patient Position: Sitting, Cuff Size: Large)   Pulse 60   Temp 98.1 F (36.7 C)   Resp 16   Ht 5\' 3"  (1.6 m)   Wt 256 lb (116.1 kg)   SpO2 94%   BMI 45.35 kg/m  Vitals:   01/26/17 1114  BP: (!) 142/64  Pulse: 60  Resp: 16  Temp: 98.1 F (36.7 C)  SpO2: 94%  Weight: 256 lb (116.1 kg)  Height: 5\' 3"   (1.6 m)     Physical Exam  Constitutional: She is oriented to person, place, and time. She appears well-developed and well-nourished.  Pleasant, morbidly obese white female in no acute distress.  HENT:  Head: Normocephalic and atraumatic.  Right Ear: External ear normal.  Left Ear: External ear normal.  Nose: Nose normal.  Eyes: Conjunctivae are normal. No scleral icterus.  Cardiovascular: Normal rate, regular rhythm and normal heart sounds.   Pulmonary/Chest: Effort normal and breath sounds normal.  Abdominal: Soft.  Musculoskeletal: She exhibits edema.  +1 pitting edema bilaterally  Neurological: She is alert and oriented to person, place, and time.  Skin: Skin is warm and dry.  Psychiatric: She has  a normal mood and affect. Her behavior is normal. Judgment and thought content normal.        Assessment & Plan:     1. Type 2 diabetes mellitus without complication, without long-term current use of insulin ([name])  - POCT glycosylated hemoglobin (Hb A1C)  2. Essential (primary) hypertension  - Comprehensive metabolic panel  3. Influenza vaccine needed  - Flu vaccine HIGH DOSE PF (Fluzone High dose)  4. DDD (degenerative disc disease), lumbar  - Ambulatory referral to Physical Therapy  5. Chronic low back pain, unspecified back pain laterality, with sciatica presence unspecified  - Ambulatory referral to Physical Therapy  6. Acquired hypothyroidism  - CBC with Differential/Platelet - TSH  7. Pure hypercholesterolemia  - Comprehensive metabolic panel - Lipid panel 8.Breast Cancer      I have done the exam and reviewed the above chart and it is accurate to the best of my knowledge. Development worker, community has been used in this note in any air is in the dictation or transcription are unintentional.  [name], MD  [employer]

## 2017-01-27 ENCOUNTER — Telehealth: Payer: Self-pay

## 2017-01-27 NOTE — Telephone Encounter (Signed)
Patient advised.KW 

## 2017-01-27 NOTE — Telephone Encounter (Signed)
-----   Message from Jerrol Banana., MD sent at 01/27/2017 12:54 PM EDT ----- Stable.

## 2017-02-10 DIAGNOSIS — R0609 Other forms of dyspnea: Secondary | ICD-10-CM | POA: Diagnosis not present

## 2017-02-14 ENCOUNTER — Encounter: Payer: Self-pay | Admitting: Physical Therapy

## 2017-02-14 ENCOUNTER — Ambulatory Visit: Payer: Medicare Other | Attending: Family Medicine | Admitting: Physical Therapy

## 2017-02-14 DIAGNOSIS — M6281 Muscle weakness (generalized): Secondary | ICD-10-CM | POA: Insufficient documentation

## 2017-02-14 DIAGNOSIS — R262 Difficulty in walking, not elsewhere classified: Secondary | ICD-10-CM | POA: Insufficient documentation

## 2017-02-14 DIAGNOSIS — M5440 Lumbago with sciatica, unspecified side: Secondary | ICD-10-CM | POA: Diagnosis not present

## 2017-02-15 NOTE — Therapy (Signed)
[facility] [address] [place], [address] Phone: [phone]   Fax:  [phone]  Physical Therapy Evaluation  Patient Details  Name: Carol Ramsey MRN: [id number] Date of Birth: [birth year] Referring Provider: [name] MD  Encounter Date: 02/14/69      PT End of Session - 02/14/17 1445    Visit Number 1   Number of Visits 12   Date for PT Re-Evaluation 03/28/17   Authorization Type 1   Authorization Time Period 10 (G codes)   PT Start Time 1350   PT Stop Time 1440   PT Time Calculation (min) 50 min   Activity Tolerance Patient tolerated treatment well   Behavior During Therapy [facility] for tasks assessed/performed      Past Medical History:  Diagnosis Date  . Anemia   . Arthritis   . 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 Diabetes mellitus without complication ([place])   . Hyperlipidemia   . Hypertension   . Hyperthyroidism   . Obesity   . Thyroid disease    hyperthyroidism  . Vitamin D deficiency     Past Surgical History:  Procedure Laterality Date  . BREAST BIOPSY Right 06/28/2016   stereotactic biopsy - DUCTAL CARCINOMA IN SITU (DCIS), HIGH NUCLEAR GRADE   . JOINT REPLACEMENT Left    tkr  . KNEE SURGERY Left    arthroscopy  . MASTECTOMY, PARTIAL Right 07/18/2016   Procedure: MASTECTOMY PARTIAL;  Surgeon: Robert Bellow, MD;  Location: [facility] ORS;  Service: General;  Laterality: Right;  . SKIN CANCER EXCISION     face    There were no vitals filed for this visit.       Subjective Assessment - 02/14/17 1417    Subjective Patient reports she is having back pain with prolonged standing. She is not able to perform household chores which requires standing such as dishes, standing in shower to wash. Pain also affects walking and bending activities.   Pertinent History history of back pain; most recent episode began   11/2016 and now she is having increased pain into bilateral buttocks with sitting, which quickly resolves   Limitations Sitting;Standing;Walking;House hold activities   How long can you sit comfortably? as long as she would like with choice of seating surfaces   How long can you stand comfortably? 10 minutes or less   How long can you walk comfortably? no more than short distances   Patient Stated Goals walk further with less pain, difficulty   Currently in Pain? Yes   Pain Score 8   constanct 8-9/10 per patient report   Pain Location Back   Pain Orientation Lower   Pain Descriptors / Indicators Aching   Pain Type Chronic pain   Pain Onset More than a month ago   Pain Frequency Constant   Effect of Pain on Daily Activities limited in standing, walking and bending activities            [facility] PT Assessment - 02/14/17 1407      Assessment   Medical Diagnosis DDD, lumbar; chronic low back pain, unspecified back pain laterality, with sciatica presence unspecified   Referring Provider [name] MD   Onset Date/Surgical Date 11/30/16   Hand Dominance Right   Prior Therapy yes prior therapy for back couple of years ago     Precautions  Precautions None     Balance Screen   Has the patient fallen in the past 6 months No     [place] residence   Living Arrangements Spouse/significant other;Children   Type of [place] to enter   Entrance Stairs-Number of Steps [place] One level     Prior Function   Level of [place] Retired   Leisure read, concerts, travel     Cognition   Overall Cognitive Status Within Functional Limits for tasks assessed     Observation/Other Assessments   Modified [place] 54% (severe self perceived disabiltiy)     Posture/Postural Control   Posture Comments sitting: slouched posture: standing posterior tilt pelvis      ROM / Strength   AROM / PROM / Strength AROM;Strength     AROM   Overall AROM Comments lumbar spine limited flexion, extension moderate with pain; hips, knees, ankles grossly WFL     Strength   Overall Strength Comments bilateral hip flexion, abduction, extension decreased 4-/5; knee extension bilateral 4-/5; knee flexion 4-/5     Ambulation/Gait   Gait Pattern Decreased arm swing - right;Decreased arm swing - left;Decreased step length - right;Decreased step length - left     Outcome measure: 5x sit to stand: using UE's for assistance: 21 seconds (unable to stand from standard chair without use of UE's)   Objective measurements completed on examination: See above findings.          PT Education - 02/14/17 1430    Education provided Yes   Education Details HEP; stabilization in sitting; hip adduction with ball and glute sets; hip abduction with resistive band, roll ball under each foot 25x; POC   Person(s) Educated Patient   Methods Explanation;Demonstration;Verbal cues;Handout   Comprehension Verbalized understanding;Returned demonstration;Verbal cues required             PT Long Term Goals - 02/14/17 1900      PT LONG TERM GOAL #1   Title  Patient will demonstrate improved function with daily tasks and decreased back pain as indicated by MODI score of 40% or better   Baseline 54%   Status New   Target Date 03/08/17     PT LONG TERM GOAL #2   Title Patient will demonstrate improved posture awareness and pain control strategies to allow patient to sit/stand for >30 min.   Baseline unable to sit or stand for 10 min. without increased back pain   Status New   Target Date 03/28/17     PT LONG TERM GOAL #3   Title Patient will improve 5x sit to stand in 15 seconds or less demonstrating improved functional strength in LE's   Baseline 21 seconds   Status New   Target Date 03/28/17     PT LONG TERM GOAL #4   Title Patient will be independent with home program  for posture awareness, pain control, progressive exercises to allow patient to transition to self management once discharged from physical therapy   Baseline requires guidance, moderate cuing for exercises, limited knowledge of appropriate pain control strategies, exercises   Status New   Target Date 03/28/17     PT LONG TERM GOAL #5   Title Patient will report maximal pain level of 5/10 with sitting, standing or walking activities   Baseline pain level ranges 8-9/10 and constant   Status New  Target Date 03/14/17     Additional Long Term Goals   Additional Long Term Goals Yes     PT LONG TERM GOAL #6   Title Patient will demonstrate decreased pain to moderate and improved function with less difficulty with MODI score of 30% or better   Baseline 54% initially 02/14/17   Status New   Target Date 03/28/17                Plan - 02/14/17 1917    Clinical Impression Statement Patient is a 71 year old female who presents with worsening back pain with radiation of symptoms into bilateral hip/buttocks. Pain is constant and ranges from 8-9/10. She is limited in standing and walking activities due to pain. she has MODI score of 54% indicating severe self perceived disability with daily activities. She has limited knowledge of appropriate exercises and progression including pain control strategies and will benefit from physical therapy intervention.    History and Personal Factors relevant to plan of care: pain in back with worsening symptoms since 11/2016. prior history of back pain, multiple co morbidities including diabetes, CA   Clinical Presentation Evolving   Clinical Presentation due to: radiating symptoms to bilateral hip/buttocks recently   Clinical Decision Making Moderate   Rehab Potential Fair   Clinical Impairments Affecting Rehab Potential (+)motivated, prior level of function(-)chronic condition, multiple co morbidities   PT Frequency 2x / week   PT Duration 6 weeks   PT  Treatment/Interventions Moist Heat;Electrical Stimulation;Cryotherapy;Patient/family education;Neuromuscular re-education;Therapeutic exercise;Manual techniques   PT Next Visit Plan pain control, progress exercises   PT Home Exercise Plan stabilization exercises for hip, LE   Consulted and Agree with Plan of Care Patient      Patient will benefit from skilled therapeutic intervention in order to improve the following deficits and impairments:  Decreased strength, Decreased activity tolerance, Impaired perceived functional ability, Pain, Obesity, Increased muscle spasms, Decreased endurance, Difficulty walking  Visit Diagnosis: Low back pain with sciatica, sciatica laterality unspecified, unspecified back pain laterality, unspecified chronicity - Plan: PT plan of care cert/re-cert  Muscle weakness (generalized) - Plan: PT plan of care cert/re-cert  Difficulty in walking, not elsewhere classified - Plan: PT plan of care cert/re-cert      G-Codes - 24/26/83 1924    Functional Assessment Tool Used (Outpatient Only) MODI, strength, pain, ROM, clinical judgment   Functional Limitation Mobility: Walking and moving around   Mobility: Walking and Moving Around Current Status (M1962) At least 40 percent but less than 60 percent impaired, limited or restricted   Mobility: Walking and Moving Around Goal Status [phone]) At least 20 percent but less than 40 percent impaired, limited or restricted       Problem List Patient Active Problem List   Diagnosis Date Noted  . CKD (chronic kidney disease) stage 3, GFR 30-59 ml/min ([name]) 07/21/2016  . 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 LVH (left ventricular hypertrophy) due to hypertensive disease, without heart failure 01/19/2016  . Pulmonary hypertension ([place]) 02/10/2015  . Acquired hypothyroidism 09/17/2014  . Adaptation reaction 09/17/2014  . Absolute anemia 09/17/2014  . Cardiac dysrhythmia 09/17/2014  . Arthropathia  09/17/2014  . 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 Diabetes mellitus, type 2 ([place]) 09/17/2014  . Essential (primary) hypertension 09/17/2014  . HLD (hyperlipidemia) 09/17/2014  . Extreme obesity 09/17/2014  . Vitamin D deficiency 09/17/2014  . Beat, premature ventricular 12/11/2013  . OBESITY 11/14/2007  .  ACHILLES BURSITIS OR TENDINITIS 11/14/2007  . Bonita Quin 06/26/2007    Jomarie Longs PT 02/15/2017, 9:55 PM  [facility] [address], [place], [address] Phone: [phone]   Fax:  [phone]  Name: Ira L Munyan MRN: [id number] Date of Birth: 1945-11-06

## 2017-02-20 ENCOUNTER — Encounter: Payer: Self-pay | Admitting: Physical Therapy

## 2017-02-20 ENCOUNTER — Ambulatory Visit: Payer: Medicare Other | Admitting: Physical Therapy

## 2017-02-20 DIAGNOSIS — M5440 Lumbago with sciatica, unspecified side: Secondary | ICD-10-CM | POA: Diagnosis not present

## 2017-02-20 DIAGNOSIS — M6281 Muscle weakness (generalized): Secondary | ICD-10-CM | POA: Diagnosis not present

## 2017-02-20 DIAGNOSIS — R262 Difficulty in walking, not elsewhere classified: Secondary | ICD-10-CM | POA: Diagnosis not present

## 2017-02-20 NOTE — Therapy (Signed)
[place] [facility] [address] [place], [address] Phone: [phone]   Fax:  [phone]  Physical Therapy Treatment  Patient Details  Name: Carol Ramsey MRN: [id number] Date of Birth: May 07, 1945 Referring Provider: [name] MD  Encounter Date: 02/20/2017      PT End of Session - 02/20/17 1527    Visit Number 2   Number of Visits 12   Date for PT Re-Evaluation 03/28/17   Authorization Type 2   Authorization Time Period 10 (G codes)   PT Start Time 1455   PT Stop Time 1525   PT Time Calculation (min) 30 min   Activity Tolerance Patient tolerated treatment well   Behavior During Therapy [facility] for tasks assessed/performed      Past Medical History:  Diagnosis Date  . Anemia   . Arthritis   . 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 Diabetes mellitus without complication ([place])   . Hyperlipidemia   . Hypertension   . Hyperthyroidism   . Obesity   . Thyroid disease    hyperthyroidism  . Vitamin D deficiency     Past Surgical History:  Procedure Laterality Date  . BREAST BIOPSY Right 06/28/2016   stereotactic biopsy - DUCTAL CARCINOMA IN SITU (DCIS), HIGH NUCLEAR GRADE   . JOINT REPLACEMENT Left    tkr  . KNEE SURGERY Left    arthroscopy  . MASTECTOMY, PARTIAL Right 07/18/2016   Procedure: MASTECTOMY PARTIAL;  Surgeon: Robert Bellow, MD;  Location: [facility] ORS;  Service: General;  Laterality: Right;  . SKIN CANCER EXCISION     face    There were no vitals filed for this visit.      Subjective Assessment - 02/20/17 1455    Subjective Patient reports feeling soreness the day or two following previous session. she is not having too much pain today. she is compliant with home program   Pertinent History history of back pain; most recent episode began   and now she is having increased pain into bilateral buttocks with sitting,  quickly resolves   Limitations Sitting;Standing;Walking;House hold activities   How long can you sit comfortably? as long as she would like with choice of seating surfaces   How long can you stand comfortably? 10 minutes or less   How long can you walk comfortably? no more than short distances   Patient Stated Goals walk further with less pain, difficulty   Currently in Pain? Yes   Pain Score 6    Pain Location Back   Pain Orientation Lower   Pain Descriptors / Indicators Aching   Pain Type Chronic pain   Pain Onset More than a month ago   Pain Frequency Constant     Observation: Gait ;increased valgus right knee and left  (right greater) Forward flexed posture, decreased flexibiltiy in hips and strength in quadriceps and hip ER/abduction  Treatment:  Therapeutic exercise: patient performed exercises with verbal, tactile cues and demonstration of therapist: goal: strength, independent with home program, improve LEFS Sitting: Hip adduction with glute sets x 15 with ball and hold 2 seconds each contraction Hip abduction with resistive band/manual resistance x 10 reps each Knee flexion with red resistive band 2 x 15 reps Sit to stand from elevated surface (24") onto balance pad holding (2) 2# weights 2 x 5 reps with contact guard PF off balance  stones with 4# weight on thighs 2 x 15 DF with red resistive band 2 x 15 reps  Patient response to treatment: patient demonstrated improved technique with exercises with moderate assistance and VC for correct alignment. Improved motor control with repetition and cuing.           PT Education - 02/20/17 1506    Education provided Yes   Education Details exercise instruction for correct technique/alignment   Person(s) Educated Patient   Methods Explanation;Demonstration;Verbal cues   Comprehension Verbalized understanding;Returned demonstration;Verbal cues required             PT Long Term Goals - 02/14/17 1900      PT LONG  TERM GOAL #1   Title  Patient will demonstrate improved function with daily tasks and decreased back pain as indicated by MODI score of 40% or better   Baseline 54%   Status New   Target Date 03/08/17     PT LONG TERM GOAL #2   Title Patient will demonstrate improved posture awareness and pain control strategies to allow patient to sit/stand for >30 min.   Baseline unable to sit or stand for 10 min. without increased back pain   Status New   Target Date 03/28/17     PT LONG TERM GOAL #3   Title Patient will improve 5x sit to stand in 15 seconds or less demonstrating improved functional strength in LE's   Baseline 21 seconds   Status New   Target Date 03/28/17     PT LONG TERM GOAL #4   Title Patient will be independent with home program for posture awareness, pain control, progressive exercises to allow patient to transition to self management once discharged from physical therapy   Baseline requires guidance, moderate cuing for exercises, limited knowledge of appropriate pain control strategies, exercises   Status New   Target Date 03/28/17     PT LONG TERM GOAL #5   Title Patient will report maximal pain level of 5/10 with sitting, standing or walking activities   Baseline pain level ranges 8-9/10 and constant   Status New   Target Date 03/14/17     Additional Long Term Goals   Additional Long Term Goals Yes     PT LONG TERM GOAL #6   Title Patient will demonstrate decreased pain to moderate and improved function with less difficulty with MODI score of 30% or better   Baseline 54% initially 02/14/17   Status New   Target Date 03/28/17               Plan - 02/20/17 1528    Clinical Impression Statement Patient demonstrated improvement with strength as demonstrated with increased intensity of exercises with minimal rest between exercises. She is compliant with home program. She should conitnue to improve with additional physical therapy intervention to progress  exercises appropriately.   Rehab Potential Fair   Clinical Impairments Affecting Rehab Potential (+)motivated, prior level of function(-)chronic condition, multiple co morbidities   PT Frequency 2x / week   PT Duration 6 weeks   PT Treatment/Interventions Moist Heat;Electrical Stimulation;Cryotherapy;Patient/family education;Neuromuscular re-education;Therapeutic exercise;Manual techniques   PT Next Visit Plan pain control, progress exercises   PT Home Exercise Plan stabilization exercises for hip, LE      Patient will benefit from skilled therapeutic intervention in order to improve the following deficits and impairments:  Decreased strength, Decreased activity tolerance, Impaired perceived functional ability, Pain, Obesity, Increased muscle spasms, Decreased endurance, Difficulty walking  Visit Diagnosis:  Low back pain with sciatica, sciatica laterality unspecified, unspecified back pain laterality, unspecified chronicity  Muscle weakness (generalized)  Difficulty in walking, not elsewhere classified     Problem List Patient Active Problem List   Diagnosis Date Noted  . CKD (chronic kidney disease) stage 3, GFR 30-59 ml/min ([name]) 07/21/2016  . 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 LVH (left ventricular hypertrophy) due to hypertensive disease, without heart failure 01/19/2016  . Pulmonary hypertension ([place]) 02/10/2015  . Acquired hypothyroidism 09/17/2014  . Adaptation reaction 09/17/2014  . Absolute anemia 09/17/2014  . Cardiac dysrhythmia 09/17/2014  . Arthropathia 09/17/2014  . 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 Diabetes mellitus, type 2 ([place]) 09/17/2014  . Essential (primary) hypertension 09/17/2014  . HLD (hyperlipidemia) 09/17/2014  . Extreme obesity 09/17/2014  . Vitamin D deficiency 09/17/2014  . Beat, premature ventricular 12/11/2013  . OBESITY 11/14/2007  . ACHILLES BURSITIS OR TENDINITIS 11/14/2007  . Bonita Quin 06/26/2007    Jomarie Longs PT 02/21/2017, 3:07 PM  [place] [facility] [address] [place], [address] Phone: [phone]   Fax:  [phone]  Name: Carol Ramsey MRN: [id number] Date of Birth: 11-19-1945

## 2017-02-21 DIAGNOSIS — N183 Chronic kidney disease, stage 3 (moderate): Secondary | ICD-10-CM | POA: Diagnosis not present

## 2017-02-21 DIAGNOSIS — E782 Mixed hyperlipidemia: Secondary | ICD-10-CM | POA: Diagnosis not present

## 2017-02-21 DIAGNOSIS — R600 Localized edema: Secondary | ICD-10-CM | POA: Diagnosis not present

## 2017-02-21 DIAGNOSIS — I10 Essential (primary) hypertension: Secondary | ICD-10-CM | POA: Diagnosis not present

## 2017-02-22 ENCOUNTER — Ambulatory Visit: Payer: Medicare Other | Admitting: Physical Therapy

## 2017-02-22 ENCOUNTER — Encounter: Payer: Self-pay | Admitting: Physical Therapy

## 2017-02-22 DIAGNOSIS — M5440 Lumbago with sciatica, unspecified side: Secondary | ICD-10-CM

## 2017-02-22 DIAGNOSIS — M6281 Muscle weakness (generalized): Secondary | ICD-10-CM

## 2017-02-22 DIAGNOSIS — R262 Difficulty in walking, not elsewhere classified: Secondary | ICD-10-CM

## 2017-02-22 NOTE — Therapy (Signed)
[place] [facility] [address], [place], [address] Phone: [phone]   Fax:  [phone]  Physical Therapy Treatment  Patient Details  Name: Carol Ramsey MRN: [id number] Date of Birth: Jan 31, 1946 Referring Provider: [name] MD  Encounter Date: 02/22/2017      PT End of Session - 02/22/17 1158    Visit Number 3   Number of Visits 12   Date for PT Re-Evaluation 03/28/17   Authorization Type 3   Authorization Time Period 10 (G codes)   PT Start Time 1150   PT Stop Time 1235   PT Time Calculation (min) 45 min   Activity Tolerance Patient tolerated treatment well   Behavior During Therapy [facility] for tasks assessed/performed      Past Medical History:  Diagnosis Date  . Anemia   . Arthritis   . 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 Diabetes mellitus without complication ([place])   . Hyperlipidemia   . Hypertension   . Hyperthyroidism   . Obesity   . Thyroid disease    hyperthyroidism  . Vitamin D deficiency     Past Surgical History:  Procedure Laterality Date  . BREAST BIOPSY Right 06/28/2016   stereotactic biopsy - DUCTAL CARCINOMA IN SITU (DCIS), HIGH NUCLEAR GRADE   . JOINT REPLACEMENT Left    tkr  . KNEE SURGERY Left    arthroscopy  . MASTECTOMY, PARTIAL Right 07/18/2016   Procedure: MASTECTOMY PARTIAL;  Surgeon: Robert Bellow, MD;  Location: [facility] ORS;  Service: General;  Laterality: Right;  . SKIN CANCER EXCISION     face    There were no vitals filed for this visit.      Subjective Assessment - 02/22/17 1153    Subjective Patient reports she is feeling sore in lower back on arrival today.    Pertinent History history of back pain; most recent episode began   and now she is having increased pain into bilateral buttocks with sitting, quickly resolves   Limitations Sitting;Standing;Walking;House hold activities   How  long can you sit comfortably? as long as she would like with choice of seating surfaces   How long can you stand comfortably? 10 minutes or less   How long can you walk comfortably? no more than short distances   Patient Stated Goals walk further with less pain, difficulty   Currently in Pain? Yes   Pain Score 6    Pain Location Back   Pain Orientation Lower   Pain Descriptors / Indicators Aching;Sore   Pain Type Chronic pain   Pain Onset More than a month ago   Pain Frequency Constant        Observation: Gait ;increased valgus right knee and left  (right greater) Forward flexed posture, decreased flexibiltiy in hips and strength in quadriceps and hip ER/abduction  Treatment:  Therapeutic exercise: patient performed exercises with verbal, tactile cues and demonstration of therapist: goal: strength, independent with home program, improve LEFS Sitting: with moist heat applied to lower back with patient in chair no adverse reactions noted; goal: pain control Hip adduction with glute sets x 15 with ball and hold 2 seconds each contraction Hip abduction with resistive band/manual resistance x 10 reps each Knee flexion with red resistive band 2 x 15 reps Sit to stand from elevated surface (24") holding (2) 3# weights 2 x 5 reps  with close supervision PF off balance stones with 6# weight on thighs 2 x 15 DF with red resistive band 2 x 15 reps  Patient response to treatment: Improved endurance, strength and technique with exercises with moderate cuing and repetition. Improved gait pattern with decreased valgus right knee at end of session.         PT Education - 02/22/17 1156    Education provided Yes   Education Details re assessed home exercises   Person(s) Educated Patient   Methods Explanation;Demonstration;Verbal cues   Comprehension Verbalized understanding;Returned demonstration;Verbal cues required             PT Long Term Goals - 02/14/17 1900      PT LONG TERM  GOAL #1   Title  Patient will demonstrate improved function with daily tasks and decreased back pain as indicated by MODI score of 40% or better   Baseline 54%   Status New   Target Date 03/08/17     PT LONG TERM GOAL #2   Title Patient will demonstrate improved posture awareness and pain control strategies to allow patient to sit/stand for >30 min.   Baseline unable to sit or stand for 10 min. without increased back pain   Status New   Target Date 03/28/17     PT LONG TERM GOAL #3   Title Patient will improve 5x sit to stand in 15 seconds or less demonstrating improved functional strength in LE's   Baseline 21 seconds   Status New   Target Date 03/28/17     PT LONG TERM GOAL #4   Title Patient will be independent with home program for posture awareness, pain control, progressive exercises to allow patient to transition to self management once discharged from physical therapy   Baseline requires guidance, moderate cuing for exercises, limited knowledge of appropriate pain control strategies, exercises   Status New   Target Date 03/28/17     PT LONG TERM GOAL #5   Title Patient will report maximal pain level of 5/10 with sitting, standing or walking activities   Baseline pain level ranges 8-9/10 and constant   Status New   Target Date 03/14/17     Additional Long Term Goals   Additional Long Term Goals Yes     PT LONG TERM GOAL #6   Title Patient will demonstrate decreased pain to moderate and improved function with less difficulty with MODI score of 30% or better   Baseline 54% initially 02/14/17   Status New   Target Date 03/28/17               Plan - 02/22/17 1245    Clinical Impression Statement Patient improved endurance and able to transfer sit to stand with less difficult today indicating good carry over and improving strength. she required moderat cuing to perform exercises with appropriate intensity and good technique.    Rehab Potential Fair   Clinical  Impairments Affecting Rehab Potential (+)motivated, prior level of function(-)chronic condition, multiple co morbidities   PT Frequency 2x / week   PT Duration 6 weeks   PT Treatment/Interventions Moist Heat;Electrical Stimulation;Cryotherapy;Patient/family education;Neuromuscular re-education;Therapeutic exercise;Manual techniques   PT Next Visit Plan pain control, progress exercises   PT Home Exercise Plan stabilization exercises for hip, LE      Patient will benefit from skilled therapeutic intervention in order to improve the following deficits and impairments:  Decreased strength, Decreased activity tolerance, Impaired perceived functional ability, Pain, Obesity, Increased muscle spasms, Decreased endurance, Difficulty  walking  Visit Diagnosis: Low back pain with sciatica, sciatica laterality unspecified, unspecified back pain laterality, unspecified chronicity  Muscle weakness (generalized)  Difficulty in walking, not elsewhere classified     Problem List Patient Active Problem List   Diagnosis Date Noted  . CKD (chronic kidney disease) stage 3, GFR 30-59 ml/min ([name]) 07/21/2016  . 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 LVH (left ventricular hypertrophy) due to hypertensive disease, without heart failure 01/19/2016  . Pulmonary hypertension ([place]) 02/10/2015  . Acquired hypothyroidism 09/17/2014  . Adaptation reaction 09/17/2014  . Absolute anemia 09/17/2014  . Cardiac dysrhythmia 09/17/2014  . Arthropathia 09/17/2014  . 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 Diabetes mellitus, type 2 ([place]) 09/17/2014  . Essential (primary) hypertension 09/17/2014  . HLD (hyperlipidemia) 09/17/2014  . Extreme obesity 09/17/2014  . Vitamin D deficiency 09/17/2014  . Beat, premature ventricular 12/11/2013  . OBESITY 11/14/2007  . ACHILLES BURSITIS OR TENDINITIS 11/14/2007  . Bonita Quin 06/26/2007    Jomarie Longs PT 02/22/2017, 1:49  PM  [place] [facility] [address] [place], [address] Phone: [phone]   Fax:  [phone]  Name: Carol Ramsey MRN: [id number] Date of Birth: 1945/08/18

## 2017-02-23 DIAGNOSIS — D0461 Carcinoma in situ of skin of right upper limb, including shoulder: Secondary | ICD-10-CM | POA: Diagnosis not present

## 2017-02-28 ENCOUNTER — Encounter: Payer: Self-pay | Admitting: Physical Therapy

## 2017-02-28 ENCOUNTER — Ambulatory Visit: Payer: Medicare Other | Admitting: Physical Therapy

## 2017-02-28 DIAGNOSIS — M5440 Lumbago with sciatica, unspecified side: Secondary | ICD-10-CM | POA: Diagnosis not present

## 2017-02-28 DIAGNOSIS — R262 Difficulty in walking, not elsewhere classified: Secondary | ICD-10-CM | POA: Diagnosis not present

## 2017-02-28 DIAGNOSIS — M6281 Muscle weakness (generalized): Secondary | ICD-10-CM | POA: Diagnosis not present

## 2017-02-28 NOTE — Therapy (Signed)
[place] [facility] [address], [place], [address] Phone: [phone]   Fax:  [phone]  Physical Therapy Treatment  Patient Details  Name: Carol Ramsey MRN: [id number] Date of Birth: 18-Dec-1945 Referring Provider: [name] MD  Encounter Date: 02/28/2017      PT End of Session - 02/28/17 0951    Visit Number 4   Number of Visits 12   Date for PT Re-Evaluation 03/28/17   Authorization Type 4   Authorization Time Period 10 (G codes)   PT Start Time 0945   PT Stop Time 1028   PT Time Calculation (min) 43 min   Activity Tolerance Patient tolerated treatment well   Behavior During Therapy [facility] for tasks assessed/performed      Past Medical History:  Diagnosis Date  . Anemia   . Arthritis   . 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 Diabetes mellitus without complication ([place])   . Hyperlipidemia   . Hypertension   . Hyperthyroidism   . Obesity   . Thyroid disease    hyperthyroidism  . Vitamin D deficiency     Past Surgical History:  Procedure Laterality Date  . BREAST BIOPSY Right 06/28/2016   stereotactic biopsy - DUCTAL CARCINOMA IN SITU (DCIS), HIGH NUCLEAR GRADE   . JOINT REPLACEMENT Left    tkr  . KNEE SURGERY Left    arthroscopy  . MASTECTOMY, PARTIAL Right 07/18/2016   Procedure: MASTECTOMY PARTIAL;  Surgeon: Robert Bellow, MD;  Location: [facility] ORS;  Service: General;  Laterality: Right;  . SKIN CANCER EXCISION     face    There were no vitals filed for this visit.      Subjective Assessment - 02/28/17 0948    Subjective Patient reports she is having increased back pain today and feels this is related to sitting in low seats at movie theater and sitting in a chair for dermatologist treatment last week.    Pertinent History history of back pain; most recent episode began   and now she is having increased pain into  bilateral buttocks with sitting, quickly resolves   Limitations Sitting;Standing;Walking;House hold activities   How long can you sit comfortably? as long as she would like with choice of seating surfaces   How long can you stand comfortably? 10 minutes or less   How long can you walk comfortably? no more than short distances   Patient Stated Goals walk further with less pain, difficulty   Currently in Pain? Yes   Pain Score 7    Pain Location Back   Pain Orientation Lower   Pain Descriptors / Indicators Aching;Sore   Pain Type Chronic pain   Pain Onset More than a month ago   Pain Frequency Constant        Observation: Gait; increased valgus right knee and left (right greater) Forward flexed posture, decreased flexibiltiy in hips and strength in quadriceps and hip ER/abduction  Treatment:  Therapeutic exercise: patient performed exercises with verbal, tactile cues and demonstration of therapist: goal: strength, independent with home program, improve LEFS Sitting: with moist heat applied to lower back with patient in chair no adverse reactions noted; goal: pain control 2# ankle weights bilateral knee extension 2 x 15 Hip adduction with glute sets x 15 with ball and hold 2 seconds each contraction Hip abduction with resistive band x 10  reps each Knee flexion with redresistive band 2 x 15 reps Sit to stand from elevated surface (24") holding (2) 3# weights 2 x 5 reps with close supervision PF off balance stones with 6# weight on thighs 2 x 15 DF with red resistive band 2 x 15 reps  Patient response to treatment: Improved endurance, strength and technique with exercises with moderate assistance, cuing of therapist. Improved motor control with repetition.         PT Education - 02/28/17 0950    Education provided Yes   Education Details exercise instruction   Person(s) Educated Patient   Methods Explanation;Demonstration;Verbal cues   Comprehension Verbal cues  required;Returned demonstration;Verbalized understanding             PT Long Term Goals - 02/14/17 1900      PT LONG TERM GOAL #1   Title  Patient will demonstrate improved function with daily tasks and decreased back pain as indicated by MODI score of 40% or better   Baseline 54%   Status New   Target Date 03/08/17     PT LONG TERM GOAL #2   Title Patient will demonstrate improved posture awareness and pain control strategies to allow patient to sit/stand for >30 min.   Baseline unable to sit or stand for 10 min. without increased back pain   Status New   Target Date 03/28/17     PT LONG TERM GOAL #3   Title Patient will improve 5x sit to stand in 15 seconds or less demonstrating improved functional strength in LE's   Baseline 21 seconds   Status New   Target Date 03/28/17     PT LONG TERM GOAL #4   Title Patient will be independent with home program for posture awareness, pain control, progressive exercises to allow patient to transition to self management once discharged from physical therapy   Baseline requires guidance, moderate cuing for exercises, limited knowledge of appropriate pain control strategies, exercises   Status New   Target Date 03/28/17     PT LONG TERM GOAL #5   Title Patient will report maximal pain level of 5/10 with sitting, standing or walking activities   Baseline pain level ranges 8-9/10 and constant   Status New   Target Date 03/14/17     Additional Long Term Goals   Additional Long Term Goals Yes     PT LONG TERM GOAL #6   Title Patient will demonstrate decreased pain to moderate and improved function with less difficulty with MODI score of 30% or better   Baseline 54% initially 02/14/17   Status New   Target Date 03/28/17               Plan - 02/28/17 1215    Clinical Impression Statement Patient demonstrates improvement with strength and endurance with exercises with less VC for correct technqiue. She continues with back pain,  limitations with standing and walking and will therefore benefit from additional physical therapy intervention.    Rehab Potential Fair   Clinical Impairments Affecting Rehab Potential (+)motivated, prior level of function(-)chronic condition, multiple co morbidities   PT Frequency 2x / week   PT Duration 6 weeks   PT Treatment/Interventions Moist Heat;Electrical Stimulation;Cryotherapy;Patient/family education;Neuromuscular re-education;Therapeutic exercise;Manual techniques   PT Next Visit Plan pain control, progress exercises   PT Home Exercise Plan stabilization exercises for hip, LE      Patient will benefit from skilled therapeutic intervention in order to improve the following deficits and impairments:  Decreased strength, Decreased activity tolerance, Impaired perceived functional ability, Pain, Obesity, Increased muscle spasms, Decreased endurance, Difficulty walking  Visit Diagnosis: Low back pain with sciatica, sciatica laterality unspecified, unspecified back pain laterality, unspecified chronicity  Muscle weakness (generalized)  Difficulty in walking, not elsewhere classified     Problem List Patient Active Problem List   Diagnosis Date Noted  . CKD (chronic kidney disease) stage 3, GFR 30-59 ml/min ([name]) 07/21/2016  . 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 LVH (left ventricular hypertrophy) due to hypertensive disease, without heart failure 01/19/2016  . Pulmonary hypertension ([place]) 02/10/2015  . Acquired hypothyroidism 09/17/2014  . Adaptation reaction 09/17/2014  . Absolute anemia 09/17/2014  . Cardiac dysrhythmia 09/17/2014  . Arthropathia 09/17/2014  . 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 Diabetes mellitus, type 2 ([place]) 09/17/2014  . Essential (primary) hypertension 09/17/2014  . HLD (hyperlipidemia) 09/17/2014  . Extreme obesity 09/17/2014  . Vitamin D deficiency 09/17/2014  . Beat, premature ventricular  12/11/2013  . OBESITY 11/14/2007  . ACHILLES BURSITIS OR TENDINITIS 11/14/2007  . Bonita Quin 06/26/2007    Jomarie Longs PT 03/01/2017, 12:16 PM  [place] [facility] [address], [place], [address] Phone: [phone]   Fax:  [phone]  Name: Carol Ramsey MRN: [id number] Date of Birth: 1946/02/26

## 2017-03-02 ENCOUNTER — Ambulatory Visit: Payer: Medicare Other | Attending: Family Medicine | Admitting: Physical Therapy

## 2017-03-02 ENCOUNTER — Encounter: Payer: Self-pay | Admitting: Physical Therapy

## 2017-03-02 DIAGNOSIS — R262 Difficulty in walking, not elsewhere classified: Secondary | ICD-10-CM | POA: Diagnosis not present

## 2017-03-02 DIAGNOSIS — M6281 Muscle weakness (generalized): Secondary | ICD-10-CM | POA: Diagnosis not present

## 2017-03-02 DIAGNOSIS — M5440 Lumbago with sciatica, unspecified side: Secondary | ICD-10-CM | POA: Diagnosis not present

## 2017-03-02 NOTE — Therapy (Signed)
[facility] [address], [place], [address] Phone: [phone]   Fax:  [phone]  Physical Therapy Treatment  Patient Details  Name: Carol Ramsey MRN: [id number] Date of Birth: Celenia 01, 1947 Referring Provider: [name] MD  Encounter Date: 03/02/2017      PT End of Session - 03/02/17 1640    Visit Number 5   Number of Visits 12   Date for PT Re-Evaluation 03/28/17   Authorization Type 5   Authorization Time Period 10 (G codes)   PT Start Time 0950   PT Stop Time 1030   PT Time Calculation (min) 40 min   Activity Tolerance Patient tolerated treatment well   Behavior During Therapy [facility] for tasks assessed/performed      Past Medical History:  Diagnosis Date  . Anemia   . Arthritis   . 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 Diabetes mellitus without complication ([place])   . Hyperlipidemia   . Hypertension   . Hyperthyroidism   . Obesity   . Thyroid disease    hyperthyroidism  . Vitamin D deficiency     Past Surgical History:  Procedure Laterality Date  . BREAST BIOPSY Right 06/28/2016   stereotactic biopsy - DUCTAL CARCINOMA IN SITU (DCIS), HIGH NUCLEAR GRADE   . JOINT REPLACEMENT Left    tkr  . KNEE SURGERY Left    arthroscopy  . MASTECTOMY, PARTIAL Right 07/18/2016   Procedure: MASTECTOMY PARTIAL;  Surgeon: Robert Bellow, MD;  Location: [facility] ORS;  Service: General;  Laterality: Right;  . SKIN CANCER EXCISION     face    There were no vitals filed for this visit.      Subjective Assessment - 03/02/17 0957    Subjective Patient reports she is having about the same pain in her back and feels she is not walking as well as she would like   Pertinent History history of back pain; most recent episode began   and now she is having increased pain into bilateral buttocks with sitting, quickly resolves   Limitations  Sitting;Standing;Walking;House hold activities   How long can you sit comfortably? as long as she would like with choice of seating surfaces   How long can you stand comfortably? 10 minutes or less   How long can you walk comfortably? no more than short distances   Patient Stated Goals walk further with less pain, difficulty   Currently in Pain? Yes   Pain Score 7    Pain Location Back   Pain Orientation Lower   Pain Descriptors / Indicators Aching;Sore   Pain Type Chronic pain   Pain Onset More than a month ago   Pain Frequency Constant        Observation: Gait; increased valgus right knee>left; decreased trunk rotation; forward trunk flexion  Treatment:  Therapeutic exercise: patient performed exercises with verbal, tactile cues and demonstration of therapist: goal: strength, independent with home program, improve LEFS Sitting: with moist heat applied to lower back with patient in chair no adverse reactions noted; goal: pain control 3# ankle weights bilateral knee extension 2 x 15 Hip adduction with glute sets x 15 with ball and hold 2 seconds each contraction Hip abduction with green + red resistive band x 25 reps Knee flexion with greenresistive band 2 x 20 reps Sit to stand from elevated surface (24") holding (  2) 4# weights x 10 reps with close supervision and VC PF off balance stones with 6# weight on thighs 2 x 15 DF with red resistive band  x 25 reps  Walking against resistive band with guidance 3 steps forward and back x 10; reverse and walk backwards x 10 reps  Patient response to treatment: Improved endurance, strength and technique with exercises with moderate assistance, cuing of therapist. Improved motor control with repetition. Improved posture with more erect posture at end of session with decreased back soreness to 4-5/10         PT Education - 03/02/17 1638    Education provided Yes   Education Details exercise instruction   Person(s) Educated Patient    Methods Explanation;Demonstration;Verbal cues   Comprehension Verbalized understanding;Returned demonstration;Verbal cues required             PT Long Term Goals - 02/14/17 1900      PT LONG TERM GOAL #1   Title  Patient will demonstrate improved function with daily tasks and decreased back pain as indicated by MODI score of 40% or better   Baseline 54%   Status New   Target Date 03/08/17     PT LONG TERM GOAL #2   Title Patient will demonstrate improved posture awareness and pain control strategies to allow patient to sit/stand for >30 min.   Baseline unable to sit or stand for 10 min. without increased back pain   Status New   Target Date 03/28/17     PT LONG TERM GOAL #3   Title Patient will improve 5x sit to stand in 15 seconds or less demonstrating improved functional strength in LE's   Baseline 21 seconds   Status New   Target Date 03/28/17     PT LONG TERM GOAL #4   Title Patient will be independent with home program for posture awareness, pain control, progressive exercises to allow patient to transition to self management once discharged from physical therapy   Baseline requires guidance, moderate cuing for exercises, limited knowledge of appropriate pain control strategies, exercises   Status New   Target Date 03/28/17     PT LONG TERM GOAL #5   Title Patient will report maximal pain level of 5/10 with sitting, standing or walking activities   Baseline pain level ranges 8-9/10 and constant   Status New   Target Date 03/14/17     Additional Long Term Goals   Additional Long Term Goals Yes     PT LONG TERM GOAL #6   Title Patient will demonstrate decreased pain to moderate and improved function with less difficulty with MODI score of 30% or better   Baseline 54% initially 02/14/17   Status New   Target Date 03/28/17               Plan - 03/02/17 1641    Clinical Impression Statement Patient demonstrates improvement with strength and endurance  with exercise and is able to advance to standing and walking with mild fatigue. She colntinues with back pain and limitations with standing and walking and will therefore benefit from additional physical therapy intervention.   Rehab Potential Fair   Clinical Impairments Affecting Rehab Potential (+)motivated, prior level of function(-)chronic condition, multiple co morbidities   PT Frequency 2x / week   PT Duration 6 weeks   PT Treatment/Interventions Moist Heat;Electrical Stimulation;Cryotherapy;Patient/family education;Neuromuscular re-education;Therapeutic exercise;Manual techniques   PT Next Visit Plan pain control, progress exercises   PT Home Exercise Plan stabilization  exercises for hip, LE      Patient will benefit from skilled therapeutic intervention in order to improve the following deficits and impairments:  Decreased strength, Decreased activity tolerance, Impaired perceived functional ability, Pain, Obesity, Increased muscle spasms, Decreased endurance, Difficulty walking  Visit Diagnosis: Low back pain with sciatica, sciatica laterality unspecified, unspecified back pain laterality, unspecified chronicity  Difficulty in walking, not elsewhere classified  Muscle weakness (generalized)     Problem List Patient Active Problem List   Diagnosis Date Noted  . CKD (chronic kidney disease) stage 3, GFR 30-59 ml/min ([name]) 07/21/2016  . 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 LVH (left ventricular hypertrophy) due to hypertensive disease, without heart failure 01/19/2016  . Pulmonary hypertension ([place]) 02/10/2015  . Acquired hypothyroidism 09/17/2014  . Adaptation reaction 09/17/2014  . Absolute anemia 09/17/2014  . Cardiac dysrhythmia 09/17/2014  . Arthropathia 09/17/2014  . 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 Diabetes mellitus, type 2 ([place]) 09/17/2014  . Essential (primary) hypertension 09/17/2014  . HLD (hyperlipidemia)  09/17/2014  . Extreme obesity 09/17/2014  . Vitamin D deficiency 09/17/2014  . Beat, premature ventricular 12/11/2013  . OBESITY 11/14/2007  . ACHILLES BURSITIS OR TENDINITIS 11/14/2007  . Bonita Quin 06/26/2007    Jomarie Longs PT 03/02/2017, 4:44 PM  [facility] [address] [place], [address] Phone: [phone]   Fax:  [phone]  Name: Carol Ramsey MRN: [id number] Date of Birth: 12-28-1945

## 2017-03-07 ENCOUNTER — Ambulatory Visit: Payer: Medicare Other | Admitting: Physical Therapy

## 2017-03-07 ENCOUNTER — Encounter: Payer: Self-pay | Admitting: Physical Therapy

## 2017-03-07 DIAGNOSIS — R262 Difficulty in walking, not elsewhere classified: Secondary | ICD-10-CM

## 2017-03-07 DIAGNOSIS — M5440 Lumbago with sciatica, unspecified side: Secondary | ICD-10-CM | POA: Diagnosis not present

## 2017-03-07 DIAGNOSIS — M6281 Muscle weakness (generalized): Secondary | ICD-10-CM | POA: Diagnosis not present

## 2017-03-07 NOTE — Therapy (Signed)
[facility] [address], [place], [address] Phone: [phone]   Fax:  [phone]  Physical Therapy Treatment  Patient Details  Name: Carol Ramsey MRN: [id number] Date of Birth: 1945-11-19 Referring Provider: [name] MD   Encounter Date: 03/07/2017  PT End of Session - 03/07/17 0857    Visit Number  6    Number of Visits  12    Date for PT Re-Evaluation  03/28/17    Authorization Type  6    Authorization Time Period  10 (G codes)    PT Start Time  0855    PT Stop Time  0938    PT Time Calculation (min)  43 min    Activity Tolerance  Patient tolerated treatment well    Behavior During Therapy  [facility] for tasks assessed/performed       Past Medical History:  Diagnosis Date  . Anemia   . Arthritis   . 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 Diabetes mellitus without complication ([place])   . Hyperlipidemia   . Hypertension   . Hyperthyroidism   . Obesity   . Thyroid disease    hyperthyroidism  . Vitamin D deficiency     Past Surgical History:  Procedure Laterality Date  . BREAST BIOPSY Right 06/28/2016   stereotactic biopsy - DUCTAL CARCINOMA IN SITU (DCIS), HIGH NUCLEAR GRADE   . JOINT REPLACEMENT Left    tkr  . KNEE SURGERY Left    arthroscopy  . SKIN CANCER EXCISION     face    There were no vitals filed for this visit.  Subjective Assessment - 03/07/17 0857    Subjective  Patient reports no problems from previous session. Her back is tight and sore today.     Pertinent History  history of back pain; most recent episode began   and now she is having increased pain into bilateral buttocks with sitting, quickly resolves    Limitations  Sitting;Standing;Walking;House hold activities    How long can you sit comfortably?  as long as she would like with choice of seating surfaces    How long can you stand comfortably?  10  minutes or less    How long can you walk comfortably?  no more than short distances    Patient Stated Goals  walk further with less pain, difficulty    Currently in Pain?  Yes    Pain Score  6     Pain Location  Back    Pain Orientation  Lower    Pain Descriptors / Indicators  Tightness;Aching    Pain Type  Chronic pain    Pain Onset  More than a month ago    Pain Frequency  Constant       Observation: Gait; guarded posture on arrival  Treatment:  Therapeutic exercise: patient performed exercises with verbal, tactile cues and demonstration of therapist: goal: strength, independent with home program, improve LEFS Sitting: with moist heat applied to lower back with patient in chair no adverse reactions noted; goal: pain control 3# ankle weights bilateral knee extension 2 x 15 Hip adduction with glute sets x 15 with ball and hold 2 seconds each contraction Hip abduction with green + red resistive band x 25 reps Knee flexion with greenresistive band 2 x 20 reps Sit to stand from elevated surface (24") holding (2)  4# weights x 10 reps with close supervision and VC PF off balance stones with 6# weight on thighs 2 x 15 DF with red resistive band  x 25 reps Tap balance stones in front with 3# weights on ankles x 25 reps  Walking against resistive band with guidance 3 steps forward and back x 10; reverse and walk backwards x 10 reps  Patient response to treatment: Patient required moderate VC and instruction to perform exercises through full ROM and with good technique. She improved motor control with repetition. Back pain decreased from  6/10 to 4/10 with treatment.       PT Education - 03/07/17 [phone]    Education provided  Yes    Education Details  exercise instruction    Person(s) Educated  Patient    Methods  Explanation;Demonstration;Verbal cues    Comprehension  Returned demonstration;Verbalized understanding;Verbal cues required          PT Long Term Goals - 02/14/17  1900      PT LONG TERM GOAL #1   Title   Patient will demonstrate improved function with daily tasks and decreased back pain as indicated by MODI score of 40% or better    Baseline  54%    Status  New    Target Date  03/08/17      PT LONG TERM GOAL #2   Title  Patient will demonstrate improved posture awareness and pain control strategies to allow patient to sit/stand for >30 min.    Baseline  unable to sit or stand for 10 min. without increased back pain    Status  New    Target Date  03/28/17      PT LONG TERM GOAL #3   Title  Patient will improve 5x sit to stand in 15 seconds or less demonstrating improved functional strength in LE's    Baseline  21 seconds    Status  New    Target Date  03/28/17      PT LONG TERM GOAL #4   Title  Patient will be independent with home program for posture awareness, pain control, progressive exercises to allow patient to transition to self management once discharged from physical therapy    Baseline  requires guidance, moderate cuing for exercises, limited knowledge of appropriate pain control strategies, exercises    Status  New    Target Date  03/28/17      PT LONG TERM GOAL #5   Title  Patient will report maximal pain level of 5/10 with sitting, standing or walking activities    Baseline  pain level ranges 8-9/10 and constant    Status  New    Target Date  03/14/17      Additional Long Term Goals   Additional Long Term Goals  Yes      PT LONG TERM GOAL #6   Title  Patient will demonstrate decreased pain to moderate and improved function with less difficulty with MODI score of 30% or better    Baseline  54% initially 02/14/17    Status  New    Target Date  03/28/17            Plan - 03/07/17 0857    Clinical Impression Statement  Patient demonstrates improvement with steady progress towards goals. She demonstrates improving endurance and strength as indicated by increased repetitions for exercises and increased intensity.     Rehab Potential  Fair    Clinical Impairments Affecting Rehab Potential  (+)motivated, prior  level of function(-)chronic condition, multiple co morbidities    PT Frequency  2x / week    PT Duration  6 weeks    PT Treatment/Interventions  Moist Heat;Electrical Stimulation;Cryotherapy;Patient/family education;Neuromuscular re-education;Therapeutic exercise;Manual techniques    PT Next Visit Plan  pain control, progress exercises    PT Home Exercise Plan  stabilization exercises for hip, LE       Patient will benefit from skilled therapeutic intervention in order to improve the following deficits and impairments:  Decreased strength, Decreased activity tolerance, Impaired perceived functional ability, Pain, Obesity, Increased muscle spasms, Decreased endurance, Difficulty walking  Visit Diagnosis: Low back pain with sciatica, sciatica laterality unspecified, unspecified back pain laterality, unspecified chronicity  Difficulty in walking, not elsewhere classified  Muscle weakness (generalized)     Problem List Patient Active Problem List   Diagnosis Date Noted  . CKD (chronic kidney disease) stage 3, GFR 30-59 ml/min ([name]) 07/21/2016  . 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 LVH (left ventricular hypertrophy) due to hypertensive disease, without heart failure 01/19/2016  . Pulmonary hypertension ([place]) 02/10/2015  . Acquired hypothyroidism 09/17/2014  . Adaptation reaction 09/17/2014  . Absolute anemia 09/17/2014  . Cardiac dysrhythmia 09/17/2014  . Arthropathia 09/17/2014  . 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 Diabetes mellitus, type 2 ([place]) 09/17/2014  . Essential (primary) hypertension 09/17/2014  . HLD (hyperlipidemia) 09/17/2014  . Extreme obesity 09/17/2014  . Vitamin D deficiency 09/17/2014  . Beat, premature ventricular 12/11/2013  . OBESITY 11/14/2007  . ACHILLES BURSITIS OR TENDINITIS 11/14/2007  . Bonita Quin  06/26/2007    Jomarie Longs PT 03/07/2017, 9:43 AM  [facility] [address], [place], [address] Phone: [phone]   Fax:  [phone]  Name: Keyri L Huot MRN: [id number] Date of Birth: 1945-09-25

## 2017-03-09 ENCOUNTER — Encounter: Payer: Self-pay | Admitting: Physical Therapy

## 2017-03-09 ENCOUNTER — Ambulatory Visit: Payer: Medicare Other | Admitting: Physical Therapy

## 2017-03-09 DIAGNOSIS — R262 Difficulty in walking, not elsewhere classified: Secondary | ICD-10-CM | POA: Diagnosis not present

## 2017-03-09 DIAGNOSIS — M5440 Lumbago with sciatica, unspecified side: Secondary | ICD-10-CM | POA: Diagnosis not present

## 2017-03-09 DIAGNOSIS — M6281 Muscle weakness (generalized): Secondary | ICD-10-CM

## 2017-03-09 NOTE — Therapy (Signed)
[facility] [address] [place], [address] Phone: [phone]   Fax:  [phone]  Physical Therapy Treatment  Patient Details  Name: Carol Ramsey MRN: [id number] Date of Birth: Aquarius 24, 1947 Referring Provider: [name] MD   Encounter Date: 03/09/2017  PT End of Session - 03/09/17 1249    Visit Number  7    Number of Visits  12    Date for PT Re-Evaluation  03/28/17    Authorization Type  7    Authorization Time Period  10 (G codes)    PT Start Time  [phone]    PT Stop Time  1031    PT Time Calculation (min)  45 min    Activity Tolerance  Patient tolerated treatment well    Behavior During Therapy  [facility] for tasks assessed/performed       Past Medical History:  Diagnosis Date  . Anemia   . Arthritis   . 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 Diabetes mellitus without complication ([place])   . Hyperlipidemia   . Hypertension   . Hyperthyroidism   . Obesity   . Thyroid disease    hyperthyroidism  . Vitamin D deficiency     Past Surgical History:  Procedure Laterality Date  . BREAST BIOPSY Right 06/28/2016   stereotactic biopsy - DUCTAL CARCINOMA IN SITU (DCIS), HIGH NUCLEAR GRADE   . JOINT REPLACEMENT Left    tkr  . KNEE SURGERY Left    arthroscopy  . SKIN CANCER EXCISION     face    There were no vitals filed for this visit.  Subjective Assessment - 03/09/17 0950    Subjective  Patient reports feeling sore and stiff in lower back today. a little sore following previous session, knees    Pertinent History  history of back pain; most recent episode began   and now she is having increased pain into bilateral buttocks with sitting, quickly resolves    Limitations  Sitting;Standing;Walking;House hold activities    How long can you sit comfortably?  as long as she would like with choice of seating surfaces    How long can you  stand comfortably?  10 minutes or less    How long can you walk comfortably?  no more than short distances    Patient Stated Goals  walk further with less pain, difficulty    Currently in Pain?  Yes    Pain Score  6     Pain Location  Back    Pain Orientation  Lower    Pain Descriptors / Indicators  Aching    Pain Type  Chronic pain    Pain Onset  More than a month ago    Pain Frequency  Intermittent no pain at rest, sitting   no pain at rest, sitting        Observation: Posture with standing improved erect posture Strength: right LE hip flexion 3/5; knee extension 4-/5; flexion 4/5; left LE hip flexion 4-/5, knee extension 4-/5, flexion 4/5  Treatment:  Therapeutic exercise: patient performed exercises with verbal, tactile cues and demonstration of therapist: goal: strength, independent with home program, improve LEFS Sitting: with moist heat applied to lower back with patient in chair no adverse reactions noted; goal: pain control 3# ankle weights bilateral knee extension 2 x 15 Hip adduction with glute sets x 15  with ball and hold 2 seconds each contraction Hip abduction withgreen + redresistive band x25 reps Knee flexion withgreenresistive band 2 x 20reps Sit to stand from elevated surface (26") holding (2)3# weightsx 8reps with close supervision and VC PF off balance stones with 6# weight on thighs 2 x 15 DF with red resistive band 2 x 15 reps Tap balance stones in front with 3# weights on ankles x 25 reps Sitting crunch/hip flexion with 45 cm ball alternate each hip10x Sitting raise 45 cm ball overhead x 10 Sitting trunk stretch with stability ball forward, left and right side 3 reps   Walking against resistive band with guidance 3 steps forward and back x 10; reverse and walk backwards x 10 reps  Patient response to treatment: Patient required moderate assistance and VC to perform exercises with improved technique and position. Mild soreness bilateral knees,  back with exercises.    PT Education - 03/09/17 [phone]    Education provided  Yes    Education Details  exercise instruction    Person(s) Educated  Patient    Methods  Explanation;Demonstration;Verbal cues    Comprehension  Verbalized understanding;Returned demonstration;Verbal cues required          PT Long Term Goals - 02/14/17 1900      PT LONG TERM GOAL #1   Title   Patient will demonstrate improved function with daily tasks and decreased back pain as indicated by MODI score of 40% or better    Baseline  54%    Status  New    Target Date  03/08/17      PT LONG TERM GOAL #2   Title  Patient will demonstrate improved posture awareness and pain control strategies to allow patient to sit/stand for >30 min.    Baseline  unable to sit or stand for 10 min. without increased back pain    Status  New    Target Date  03/28/17      PT LONG TERM GOAL #3   Title  Patient will improve 5x sit to stand in 15 seconds or less demonstrating improved functional strength in LE's    Baseline  21 seconds    Status  New    Target Date  03/28/17      PT LONG TERM GOAL #4   Title  Patient will be independent with home program for posture awareness, pain control, progressive exercises to allow patient to transition to self management once discharged from physical therapy    Baseline  requires guidance, moderate cuing for exercises, limited knowledge of appropriate pain control strategies, exercises    Status  New    Target Date  03/28/17      PT LONG TERM GOAL #5   Title  Patient will report maximal pain level of 5/10 with sitting, standing or walking activities    Baseline  pain level ranges 8-9/10 and constant    Status  New    Target Date  03/14/17      Additional Long Term Goals   Additional Long Term Goals  Yes      PT LONG TERM GOAL #6   Title  Patient will demonstrate decreased pain to moderate and improved function with less difficulty with MODI score of 30% or better    Baseline   54% initially 02/14/17    Status  New    Target Date  03/28/17            Plan - 03/09/17 1031  Clinical Impression Statement  Patient demonstrates improvement with increased endurance and strength and requires moderate assistance and cuing to perform exercises with good technique.    Rehab Potential  Fair    Clinical Impairments Affecting Rehab Potential  (+)motivated, prior level of function(-)chronic condition, multiple co morbidities    PT Frequency  2x / week    PT Duration  6 weeks    PT Treatment/Interventions  Moist Heat;Electrical Stimulation;Cryotherapy;Patient/family education;Neuromuscular re-education;Therapeutic exercise;Manual techniques    PT Next Visit Plan  pain control, progress exercises    PT Home Exercise Plan  stabilization exercises for hip, LE       Patient will benefit from skilled therapeutic intervention in order to improve the following deficits and impairments:  Decreased strength, Decreased activity tolerance, Impaired perceived functional ability, Pain, Obesity, Increased muscle spasms, Decreased endurance, Difficulty walking  Visit Diagnosis: Low back pain with sciatica, sciatica laterality unspecified, unspecified back pain laterality, unspecified chronicity  Difficulty in walking, not elsewhere classified  Muscle weakness (generalized)     Problem List Patient Active Problem List   Diagnosis Date Noted  . CKD (chronic kidney disease) stage 3, GFR 30-59 ml/min ([name]) 07/21/2016  . 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 LVH (left ventricular hypertrophy) due to hypertensive disease, without heart failure 01/19/2016  . Pulmonary hypertension ([place]) 02/10/2015  . Acquired hypothyroidism 09/17/2014  . Adaptation reaction 09/17/2014  . Absolute anemia 09/17/2014  . Cardiac dysrhythmia 09/17/2014  . Arthropathia 09/17/2014  . 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 Diabetes mellitus, type 2 ([place])  09/17/2014  . Essential (primary) hypertension 09/17/2014  . HLD (hyperlipidemia) 09/17/2014  . Extreme obesity 09/17/2014  . Vitamin D deficiency 09/17/2014  . Beat, premature ventricular 12/11/2013  . OBESITY 11/14/2007  . ACHILLES BURSITIS OR TENDINITIS 11/14/2007  . Bonita Quin 06/26/2007    Jomarie Longs PT 03/09/2017, 12:58 PM  [facility] [address], [place], [address] Phone: [phone]   Fax:  [phone]  Name: Tynleigh L Jaimes MRN: [id number] Date of Birth: [birth year]

## 2017-03-13 ENCOUNTER — Ambulatory Visit: Payer: Medicare Other | Admitting: Physical Therapy

## 2017-03-13 ENCOUNTER — Encounter: Payer: Self-pay | Admitting: Physical Therapy

## 2017-03-13 DIAGNOSIS — M6281 Muscle weakness (generalized): Secondary | ICD-10-CM | POA: Diagnosis not present

## 2017-03-13 DIAGNOSIS — M5440 Lumbago with sciatica, unspecified side: Secondary | ICD-10-CM

## 2017-03-13 DIAGNOSIS — R262 Difficulty in walking, not elsewhere classified: Secondary | ICD-10-CM | POA: Diagnosis not present

## 2017-03-13 NOTE — Therapy (Signed)
[facility] [id number] [place], [address] Phone: [phone]   Fax:  [phone]  Physical Therapy Treatment  Patient Details  Name: Carol Ramsey MRN: [id number] Date of Birth: 1945/12/06 Referring Provider: [name] MD   Encounter Date: 03/13/2017  PT End of Session - 03/13/17 0918    Visit Number  8    Number of Visits  12    Date for PT Re-Evaluation  03/28/17    Authorization Type  8    Authorization Time Period  10 (G codes)    PT Start Time  0904    PT Stop Time  0950    PT Time Calculation (min)  46 min    Activity Tolerance  Patient tolerated treatment well;Patient limited by fatigue;Patient limited by pain    Behavior During Therapy  [facility] for tasks assessed/performed       Past Medical History:  Diagnosis Date  . Anemia   . Arthritis   . 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 Diabetes mellitus without complication ([place])   . Hyperlipidemia   . Hypertension   . Hyperthyroidism   . Obesity   . Thyroid disease    hyperthyroidism  . Vitamin D deficiency     Past Surgical History:  Procedure Laterality Date  . BREAST BIOPSY Right 06/28/2016   stereotactic biopsy - DUCTAL CARCINOMA IN SITU (DCIS), HIGH NUCLEAR GRADE   . JOINT REPLACEMENT Left    tkr  . KNEE SURGERY Left    arthroscopy  . SKIN CANCER EXCISION     face    There were no vitals filed for this visit.  Subjective Assessment - 03/13/17 0915    Subjective  Patient reports increased soreness over the weekend for no apparent reason.     Pertinent History  history of back pain; most recent episode began   and now she is having increased pain into bilateral buttocks with sitting, quickly resolves    Limitations  Sitting;Standing;Walking;House hold activities    How long can you sit comfortably?  as long as she would like with choice of seating surfaces     How long can you stand comfortably?  10 minutes or less    How long can you walk comfortably?  no more than short distances    Patient Stated Goals  walk further with less pain, difficulty    Currently in Pain?  Yes    Pain Score  7     Pain Location  Back    Pain Orientation  Lower    Pain Type  Chronic pain    Pain Onset  More than a month ago    Pain Frequency  Intermittent         Observation: Gait: slow cadence, lateral trunk flexion to right, guarded posture  Treatment:  Therapeutic exercise: patient performed exercises with verbal, tactile cues and demonstration of therapist: goal: strength, independent with home program, improve LEFS Sitting: with moist heat applied to lower back with patient in chair no adverse reactions noted; goal: pain control Hip adduction with glute sets x 15 with ball and hold 2 seconds each contraction Hip abduction withgreen resistive band x15 reps, then single leg x 15 reps Knee flexion withgreenresistive band 2 x 20reps Sit to stand from elevated surface (26") x 10reps with close supervision and VC PF off balance  stones with 6# weight on thighs 2 x 15 DF with red resistive band 2 x 15 reps Sitting crunch/hip flexion with 45 cm ball alternate each hip10x Sitting raise 45 cm ball overhead x 10  Walking against resistive band with guidance 3 steps forward and back x 10; reverse and walk backwards x 10 reps  Patient response to treatment:Patient required minimal to moderate cuing to perform exercises with good technique. Limited trunk exercises due to increased pain in back today. No increased pain reported during session.      PT Education - 03/13/17 0917    Education provided  Yes    Education Details  exercise instruction    Person(s) Educated  Patient    Methods  Explanation;Demonstration;Verbal cues    Comprehension  Verbalized understanding;Returned demonstration;Verbal cues required          PT Long Term Goals -  02/14/17 1900      PT LONG TERM GOAL #1   Title   Patient will demonstrate improved function with daily tasks and decreased back pain as indicated by MODI score of 40% or better    Baseline  54%    Status  New    Target Date  03/08/17      PT LONG TERM GOAL #2   Title  Patient will demonstrate improved posture awareness and pain control strategies to allow patient to sit/stand for >30 min.    Baseline  unable to sit or stand for 10 min. without increased back pain    Status  New    Target Date  03/28/17      PT LONG TERM GOAL #3   Title  Patient will improve 5x sit to stand in 15 seconds or less demonstrating improved functional strength in LE's    Baseline  21 seconds    Status  New    Target Date  03/28/17      PT LONG TERM GOAL #4   Title  Patient will be independent with home program for posture awareness, pain control, progressive exercises to allow patient to transition to self management once discharged from physical therapy    Baseline  requires guidance, moderate cuing for exercises, limited knowledge of appropriate pain control strategies, exercises    Status  New    Target Date  03/28/17      PT LONG TERM GOAL #5   Title  Patient will report maximal pain level of 5/10 with sitting, standing or walking activities    Baseline  pain level ranges 8-9/10 and constant    Status  New    Target Date  03/14/17      Additional Long Term Goals   Additional Long Term Goals  Yes      PT LONG TERM GOAL #6   Title  Patient will demonstrate decreased pain to moderate and improved function with less difficulty with MODI score of 30% or better    Baseline  54% initially 02/14/17    Status  New    Target Date  03/28/17            Plan - 03/13/17 0945    Clinical Impression Statement  Patient continues to progress steadily towards goals with guidance, minimal to moderate cuing to perform exercises with correct technique.     Rehab Potential  Fair    Clinical Impairments  Affecting Rehab Potential  (+)motivated, prior level of function(-)chronic condition, multiple co morbidities    PT Frequency  2x / week  PT Duration  6 weeks    PT Treatment/Interventions  Moist Heat;Electrical Stimulation;Cryotherapy;Patient/family education;Neuromuscular re-education;Therapeutic exercise;Manual techniques    PT Next Visit Plan  pain control, progress exercises    PT Home Exercise Plan  stabilization exercises for hip, LE       Patient will benefit from skilled therapeutic intervention in order to improve the following deficits and impairments:  Decreased strength, Decreased activity tolerance, Impaired perceived functional ability, Pain, Obesity, Increased muscle spasms, Decreased endurance, Difficulty walking  Visit Diagnosis: Low back pain with sciatica, sciatica laterality unspecified, unspecified back pain laterality, unspecified chronicity  Difficulty in walking, not elsewhere classified  Muscle weakness (generalized)     Problem List Patient Active Problem List   Diagnosis Date Noted  . CKD (chronic kidney disease) stage 3, GFR 30-59 ml/min ([name]) 07/21/2016  . 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 LVH (left ventricular hypertrophy) due to hypertensive disease, without heart failure 01/19/2016  . Pulmonary hypertension ([place]) 02/10/2015  . Acquired hypothyroidism 09/17/2014  . Adaptation reaction 09/17/2014  . Absolute anemia 09/17/2014  . Cardiac dysrhythmia 09/17/2014  . Arthropathia 09/17/2014  . 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 Diabetes mellitus, type 2 ([place]) 09/17/2014  . Essential (primary) hypertension 09/17/2014  . HLD (hyperlipidemia) 09/17/2014  . Extreme obesity 09/17/2014  . Vitamin D deficiency 09/17/2014  . Beat, premature ventricular 12/11/2013  . OBESITY 11/14/2007  . ACHILLES BURSITIS OR TENDINITIS 11/14/2007  . Bonita Quin 06/26/2007    Jomarie Longs PT 03/14/2017, 9:08  AM  [facility] [address], [place], [address] Phone: [phone]   Fax:  [phone]  Name: Carol Ramsey MRN: [id number] Date of Birth: 02/25/1946

## 2017-03-15 ENCOUNTER — Ambulatory Visit: Payer: Medicare Other | Admitting: Physical Therapy

## 2017-03-15 ENCOUNTER — Other Ambulatory Visit: Payer: Self-pay

## 2017-03-15 ENCOUNTER — Encounter: Payer: Self-pay | Admitting: Physical Therapy

## 2017-03-15 DIAGNOSIS — R262 Difficulty in walking, not elsewhere classified: Secondary | ICD-10-CM

## 2017-03-15 DIAGNOSIS — M6281 Muscle weakness (generalized): Secondary | ICD-10-CM

## 2017-03-15 DIAGNOSIS — M5440 Lumbago with sciatica, unspecified side: Secondary | ICD-10-CM

## 2017-03-15 NOTE — Therapy (Signed)
[place] [facility] [address], [place], [address] Phone: [phone]   Fax:  [phone]  Physical Therapy Treatment  Patient Details  Name: Carol Ramsey MRN: [id number] Date of Birth: December 24, 1945 Referring Provider: [name] MD   Encounter Date: 03/15/2017  PT End of Session - 03/15/17 1024    Visit Number  9    Number of Visits  12    Date for PT Re-Evaluation  03/28/17    Authorization Type  9    Authorization Time Period  10 (G codes)    PT Start Time  0904    PT Stop Time  0950    PT Time Calculation (min)  46 min    Activity Tolerance  Patient tolerated treatment well;Patient limited by fatigue;Patient limited by pain    Behavior During Therapy  [facility] for tasks assessed/performed       Past Medical History:  Diagnosis Date  . Anemia   . Arthritis   . 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 Diabetes mellitus without complication ([place])   . Hyperlipidemia   . Hypertension   . Hyperthyroidism   . Obesity   . Thyroid disease    hyperthyroidism  . Vitamin D deficiency     Past Surgical History:  Procedure Laterality Date  . BREAST BIOPSY Right 06/28/2016   stereotactic biopsy - DUCTAL CARCINOMA IN SITU (DCIS), HIGH NUCLEAR GRADE   . JOINT REPLACEMENT Left    tkr  . KNEE SURGERY Left    arthroscopy  . SKIN CANCER EXCISION     face    There were no vitals filed for this visit.  Subjective Assessment - 03/15/17 0919    Subjective  Patient reports improvement with electrical stimulation last session and was able to walk with less difficulty. she is searching for a TENS unit to use at home.     Pertinent History  history of back pain; most recent episode began   and now she is having increased pain into bilateral buttocks with sitting, quickly resolves    Limitations  Sitting;Standing;Walking;House hold activities    How long  can you sit comfortably?  as long as she would like with choice of seating surfaces    How long can you stand comfortably?  10 minutes or less    How long can you walk comfortably?  no more than short distances    Patient Stated Goals  walk further with less pain, difficulty    Currently in Pain?  Yes    Pain Score  5     Pain Orientation  Lower    Pain Descriptors / Indicators  Aching    Pain Type  Chronic pain    Pain Onset  More than a month ago    Pain Frequency  Intermittent       Observation: Gait: slow cadence, lateral trunk flexion to right, guarded posture  Treatment:  Modalities: Sitting: in conjunction with exercises: moist heat and HV estim x 20 min.  (4) electrodes applied to lower back with patient seated in chair, intensity to tolerance; no adverse reactions noted; goal: pain control Therapeutic exercise: patient performed exercises with verbal, tactile cues and demonstration of therapist: goal: strength, independent with home program, improve LEFS Hip adduction with glute sets x 15 with ball and hold 2 seconds each contraction Hip abduction withgreen resistive  band x15 reps, then single leg x 15 reps Knee flexion withgreenresistive band 2 x 20reps PF off balance stones with 6# weight on thighs 2 x 15 DF with green resistive band 2x15 reps Sitting crunch/hip flexion with 45 cm ball alternate each hip10x Sitting raise 45 cm ball overhead x 10 Large stability ball for trunk stretches forward and each side with 5 second holds 3 reps each  Walking against resistive band with guidance and graded resistance: 3 steps forward and back x 10; reverse and walk backwards x 10 reps (patient using UE support on counter for balance as needed)  Patient response to treatment:Patient required minimal cuing to perform exercises with correct technique and alignment. Decreased soreness and pain in lower back from 5/10 to 3/10 following modialities, estim.  No increased pain  reported during session.    PT Education - 03/15/17 1023    Education provided  Yes    Education Details  exercise instruction; use of TENS unit for home    Person(s) Educated  Patient    Methods  Explanation;Demonstration;Verbal cues    Comprehension  Verbalized understanding;Returned demonstration;Verbal cues required          PT Long Term Goals - 02/14/17 1900      PT LONG TERM GOAL #1   Title   Patient will demonstrate improved function with daily tasks and decreased back pain as indicated by MODI score of 40% or better    Baseline  54%    Status  New    Target Date  03/08/17      PT LONG TERM GOAL #2   Title  Patient will demonstrate improved posture awareness and pain control strategies to allow patient to sit/stand for >30 min.    Baseline  unable to sit or stand for 10 min. without increased back pain    Status  New    Target Date  03/28/17      PT LONG TERM GOAL #3   Title  Patient will improve 5x sit to stand in 15 seconds or less demonstrating improved functional strength in LE's    Baseline  21 seconds    Status  New    Target Date  03/28/17      PT LONG TERM GOAL #4   Title  Patient will be independent with home program for posture awareness, pain control, progressive exercises to allow patient to transition to self management once discharged from physical therapy    Baseline  requires guidance, moderate cuing for exercises, limited knowledge of appropriate pain control strategies, exercises    Status  New    Target Date  03/28/17      PT LONG TERM GOAL #5   Title  Patient will report maximal pain level of 5/10 with sitting, standing or walking activities    Baseline  pain level ranges 8-9/10 and constant    Status  New    Target Date  03/14/17      Additional Long Term Goals   Additional Long Term Goals  Yes      PT LONG TERM GOAL #6   Title  Patient will demonstrate decreased pain to moderate and improved function with less difficulty with MODI score  of 30% or better    Baseline  54% initially 02/14/17    Status  New    Target Date  03/28/17            Plan - 03/15/17 1024    Clinical Impression Statement  Patient progressing steadily with goals with decreasing pain with modalities and may do well with home TENS unit. She continues to require minimal assistance for guidance, cuing for exercise technique and appropriate intensity.     Rehab Potential  Fair    Clinical Impairments Affecting Rehab Potential  (+)motivated, prior level of function(-)chronic condition, multiple co morbidities    PT Frequency  2x / week    PT Duration  6 weeks    PT Treatment/Interventions  Moist Heat;Electrical Stimulation;Cryotherapy;Patient/family education;Neuromuscular re-education;Therapeutic exercise;Manual techniques    PT Next Visit Plan  pain control, progress exercises    PT Home Exercise Plan  stabilization exercises for hip, LE       Patient will benefit from skilled therapeutic intervention in order to improve the following deficits and impairments:  Decreased strength, Decreased activity tolerance, Impaired perceived functional ability, Pain, Obesity, Increased muscle spasms, Decreased endurance, Difficulty walking  Visit Diagnosis: Low back pain with sciatica, sciatica laterality unspecified, unspecified back pain laterality, unspecified chronicity  Difficulty in walking, not elsewhere classified  Muscle weakness (generalized)     Problem List Patient Active Problem List   Diagnosis Date Noted  . CKD (chronic kidney disease) stage 3, GFR 30-59 ml/min ([name]) 07/21/2016  . 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 LVH (left ventricular hypertrophy) due to hypertensive disease, without heart failure 01/19/2016  . Pulmonary hypertension ([place]) 02/10/2015  . Acquired hypothyroidism 09/17/2014  . Adaptation reaction 09/17/2014  . Absolute anemia 09/17/2014  . Cardiac dysrhythmia 09/17/2014  . Arthropathia  09/17/2014  . 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 Diabetes mellitus, type 2 ([place]) 09/17/2014  . Essential (primary) hypertension 09/17/2014  . HLD (hyperlipidemia) 09/17/2014  . Extreme obesity 09/17/2014  . Vitamin D deficiency 09/17/2014  . Beat, premature ventricular 12/11/2013  . OBESITY 11/14/2007  . ACHILLES BURSITIS OR TENDINITIS 11/14/2007  . Bonita Quin 06/26/2007    Jomarie Longs PT 03/15/2017, 10:33 AM  [place] [facility] [address], [place], [address] Phone: [phone]   Fax:  [phone]  Name: Carol Ramsey MRN: [id number] Date of Birth: March 18, 1946

## 2017-03-16 ENCOUNTER — Ambulatory Visit: Payer: Medicare Other | Admitting: Oncology

## 2017-03-20 ENCOUNTER — Ambulatory Visit: Payer: Medicare Other | Admitting: Physical Therapy

## 2017-03-20 ENCOUNTER — Encounter: Payer: Self-pay | Admitting: Physical Therapy

## 2017-03-20 ENCOUNTER — Other Ambulatory Visit: Payer: Self-pay

## 2017-03-20 DIAGNOSIS — M5440 Lumbago with sciatica, unspecified side: Secondary | ICD-10-CM | POA: Diagnosis not present

## 2017-03-20 DIAGNOSIS — M6281 Muscle weakness (generalized): Secondary | ICD-10-CM | POA: Diagnosis not present

## 2017-03-20 DIAGNOSIS — R262 Difficulty in walking, not elsewhere classified: Secondary | ICD-10-CM | POA: Diagnosis not present

## 2017-03-20 NOTE — Therapy (Signed)
[place] [facility] [address], [place], [address] Phone: [phone]   Fax:  [phone]  Physical Therapy Treatment  Patient Details  Name: Carol Ramsey MRN: [id number] Date of Birth: 1946/04/05 Referring Provider: [name] MD   Encounter Date: 03/20/2017  PT End of Session - 03/20/17 1000    Visit Number  10    Number of Visits  12    Date for PT Re-Evaluation  03/28/17    Authorization Type  10    Authorization Time Period  10 (G codes)    PT Start Time  0903    PT Stop Time  0950    PT Time Calculation (min)  47 min    Activity Tolerance  Patient tolerated treatment well;Patient limited by fatigue;Patient limited by pain    Behavior During Therapy  [facility] for tasks assessed/performed       Past Medical History:  Diagnosis Date  . Anemia   . Arthritis   . 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 Diabetes mellitus without complication ([place])   . Hyperlipidemia   . Hypertension   . Hyperthyroidism   . Obesity   . Thyroid disease    hyperthyroidism  . Vitamin D deficiency     Past Surgical History:  Procedure Laterality Date  . BREAST BIOPSY Right 06/28/2016   stereotactic biopsy - DUCTAL CARCINOMA IN SITU (DCIS), HIGH NUCLEAR GRADE   . JOINT REPLACEMENT Left    tkr  . KNEE SURGERY Left    arthroscopy  . MASTECTOMY PARTIAL Right 07/18/2016   Performed by Robert Bellow, MD at [facility] ORS  . SKIN CANCER EXCISION     face    There were no vitals filed for this visit.  Subjective Assessment - 03/20/17 0914    Subjective  Patient reports soreness in back.     Pertinent History  history of back pain; most recent episode began   and now she is having increased pain into bilateral buttocks with sitting, quickly resolves    Limitations  Sitting;Standing;Walking;House hold activities    How long can you sit comfortably?  as long as  she would like with choice of seating surfaces    How long can you stand comfortably?  10 minutes or less    How long can you walk comfortably?  no more than short distances    Patient Stated Goals  walk further with less pain, difficulty    Currently in Pain?  Yes    Pain Score  5     Pain Location  Back    Pain Orientation  Lower    Pain Descriptors / Indicators  Aching    Pain Type  Chronic pain    Pain Onset  More than a month ago    Pain Frequency  Intermittent       Observation: Posture:  Forward flexed posture with increased thoracic kyphosis MODI 45%; 5x sit to stand 20 seconds from elevated surface  Treatment:  Modalities: Sitting: in conjunction with exercises: moist heat and HV estim x 20 min.  (4) electrodes applied to lower back with patient seated in chair, intensity to tolerance; no adverse reactions noted; goal: pain control Therapeutic exercise: patient performed exercises with verbal, tactile cues and demonstration of therapist: goal: strength, independent with home program, improve LEFS Hip adduction with glute sets x 15  with ball and hold 2 seconds each contraction Hip abduction withgreen resistive band x15 reps, then single leg x 15 reps Knee flexion withgreenresistive band 2 x 20reps PF off balance stones with 6# weight on thighs 2 x 15 DF with green resistive band 2x15 reps Sitting crunch/hip flexion with 45 cm ball alternate each hip10x Sitting raise 45 cm ball overhead x 10 Large stability ball for trunk stretches forward and each side with 5 second holds 3 reps each  Patient response to treatment: patient improved technique with exercises with minimal demonstration/verbla cuing. Patient with decreased pain in lower back following estim/moist heat to 3/10. Improved posture, more erect at end of session.     PT Education - 03/20/17 1115    Education provided  Yes    Education Details  exercise insruction    Person(s) Educated  Patient     Methods  Explanation;Verbal cues;Demonstration    Comprehension  Returned demonstration;Verbal cues required;Verbalized understanding          PT Long Term Goals - 03/20/17 1120      PT LONG TERM GOAL #1   Title   Patient will demonstrate improved function with daily tasks and decreased back pain as indicated by MODI score of 40% or better    Baseline  54%    Status  On-going    Target Date  03/28/17      PT LONG TERM GOAL #2   Title  Patient will demonstrate improved posture awareness and pain control strategies to allow patient to sit/stand for >30 min.    Baseline  unable to sit or stand for 10 min. without increased back pain    Status  On-going    Target Date  03/28/17      PT LONG TERM GOAL #3   Title  Patient will improve 5x sit to stand in 15 seconds or less demonstrating improved functional strength in LE's    Baseline  21 seconds    Status  On-going    Target Date  03/28/17      PT LONG TERM GOAL #4   Title  Patient will be independent with home program for posture awareness, pain control, progressive exercises to allow patient to transition to self management once discharged from physical therapy    Baseline  requires guidance, moderate cuing for exercises, limited knowledge of appropriate pain control strategies, exercises    Status  On-going    Target Date  03/28/17      PT LONG TERM GOAL #5   Title  Patient will report maximal pain level of 5/10 with sitting, standing or walking activities    Baseline  pain level ranges 8-9/10 and constant    Status  On-going    Target Date  03/28/17      PT LONG TERM GOAL #6   Title  Patient will demonstrate decreased pain to moderate and improved function with less difficulty with MODI score of 30% or better    Baseline  54% initially 02/14/17    Status  On-going    Target Date  03/28/17            Plan - 03/20/17 1118    Clinical Impression Statement  Patient demonstrated improved posture with decreased forwared  trunk flexion at end of session. improved technique with exercises with minimal cuing. Her current impairment level is 40% based on MODI, strength and clinical judgment. She is steadily progressing with goals and should continue with additional physical therapy intervention.  Rehab Potential  Fair    Clinical Impairments Affecting Rehab Potential  (+)motivated, prior level of function(-)chronic condition, multiple co morbidities    PT Frequency  2x / week    PT Duration  6 weeks    PT Treatment/Interventions  Moist Heat;Electrical Stimulation;Cryotherapy;Patient/family education;Neuromuscular re-education;Therapeutic exercise;Manual techniques    PT Next Visit Plan  pain control, progress exercises    PT Home Exercise Plan  stabilization exercises for hip, LE       Patient will benefit from skilled therapeutic intervention in order to improve the following deficits and impairments:  Decreased strength, Decreased activity tolerance, Impaired perceived functional ability, Pain, Obesity, Increased muscle spasms, Decreased endurance, Difficulty walking  Visit Diagnosis: Low back pain with sciatica, sciatica laterality unspecified, unspecified back pain laterality, unspecified chronicity  Difficulty in walking, not elsewhere classified  Muscle weakness (generalized)   G-Codes - Mar 25, 2017 1154    Functional Assessment Tool Used (Outpatient Only)  MODI, strength, pain, ROM, clinical judgment    Functional Limitation  Mobility: Walking and moving around    Mobility: Walking and Moving Around Current Status [phone])  At least 40 percent but less than 60 percent impaired, limited or restricted    Mobility: Walking and Moving Around Goal Status [phone])  At least 20 percent but less than 40 percent impaired, limited or restricted       Problem List Patient Active Problem List   Diagnosis Date Noted  . CKD (chronic kidney disease) stage 3, GFR 30-59 ml/min ([name]) 07/21/2016  . Ductal carcinoma in  situ (DCIS) of right breast 07/04/2016  . LVH (left ventricular hypertrophy) due to hypertensive disease, without heart failure 01/19/2016  . Pulmonary hypertension ([place]) 02/10/2015  . Acquired hypothyroidism 09/17/2014  . Adaptation reaction 09/17/2014  . Absolute anemia 09/17/2014  . Cardiac dysrhythmia 09/17/2014  . Arthropathia 09/17/2014  . 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 Diabetes mellitus, type 2 ([place]) 09/17/2014  . Essential (primary) hypertension 09/17/2014  . HLD (hyperlipidemia) 09/17/2014  . Extreme obesity 09/17/2014  . Vitamin D deficiency 09/17/2014  . Beat, premature ventricular 12/11/2013  . OBESITY 11/14/2007  . ACHILLES BURSITIS OR TENDINITIS 11/14/2007  . Bonita Quin 06/26/2007    Jomarie Longs PT 2017/03/25, 11:55 AM  [place] [facility] [address], [place], [address] Phone: [phone]   Fax:  [phone]  Name: Carol Ramsey MRN: [id number] Date of Birth: 02-Nov-1945

## 2017-03-22 ENCOUNTER — Encounter: Payer: Self-pay | Admitting: Physical Therapy

## 2017-03-22 ENCOUNTER — Ambulatory Visit: Payer: Medicare Other | Admitting: Physical Therapy

## 2017-03-22 DIAGNOSIS — M5440 Lumbago with sciatica, unspecified side: Secondary | ICD-10-CM

## 2017-03-22 DIAGNOSIS — R262 Difficulty in walking, not elsewhere classified: Secondary | ICD-10-CM

## 2017-03-22 DIAGNOSIS — M6281 Muscle weakness (generalized): Secondary | ICD-10-CM | POA: Diagnosis not present

## 2017-03-22 NOTE — Therapy (Signed)
[facility] Jul 29, [address], [place], [address] Phone: [phone]   Fax:  [phone]  Physical Therapy Treatment  Patient Details  Name: Carol Ramsey MRN: [id number] Date of Birth: 29-Sep-1945 Referring Provider: [name] MD   Encounter Date: 03/22/2017  PT End of Session - 03/22/17 0910    Visit Number  11    Number of Visits  12    Date for PT Re-Evaluation  03/28/17    Authorization Type  11    Authorization Time Period  20 (G codes)    PT Start Time  0901    PT Stop Time  0945    PT Time Calculation (min)  44 min    Activity Tolerance  Patient tolerated treatment well;Patient limited by fatigue;Patient limited by pain    Behavior During Therapy  [facility] for tasks assessed/performed       Past Medical History:  Diagnosis Date  . Anemia   . Arthritis   . 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 Diabetes mellitus without complication ([place])   . Hyperlipidemia   . Hypertension   . Hyperthyroidism   . Obesity   . Thyroid disease    hyperthyroidism  . Vitamin D deficiency     Past Surgical History:  Procedure Laterality Date  . BREAST BIOPSY Right 06/28/2016   stereotactic biopsy - DUCTAL CARCINOMA IN SITU (DCIS), HIGH NUCLEAR GRADE   . JOINT REPLACEMENT Left    tkr  . KNEE SURGERY Left    arthroscopy  . MASTECTOMY, PARTIAL Right 07/18/2016   Procedure: MASTECTOMY PARTIAL;  Surgeon: Robert Bellow, MD;  Location: [facility] ORS;  Service: General;  Laterality: Right;  . SKIN CANCER EXCISION     face    There were no vitals filed for this visit.  Subjective Assessment - 03/22/17 0909    Subjective  Patient reports she is doing better today and not as sore as the other day.     Pertinent History  history of back pain; most recent episode began   and now she is having increased pain into bilateral buttocks with sitting, quickly  resolves    Limitations  Sitting;Standing;Walking;House hold activities    How long can you sit comfortably?  as long as she would like with choice of seating surfaces    How long can you stand comfortably?  10 minutes or less    How long can you walk comfortably?  no more than short distances    Patient Stated Goals  walk further with less pain, difficulty    Currently in Pain?  Yes    Pain Score  5     Pain Location  Back    Pain Orientation  Lower    Pain Descriptors / Indicators  Sore;Aching    Pain Type  Chronic pain    Pain Onset  More than a month ago    Pain Frequency  Intermittent          Observation: Posture:  Forward flexed posture with increased thoracic kyphosis with standing /walking   Treatment: Modalities: Sitting:in conjunction with exercises:moist heatand HV estimx 20 min.(4) electrodes applied to lower back with patientseatedin chair, intensity to tolerance (180 volts);no adverse reactions noted; goal: pain control Therapeutic exercise: patient performed exercises with verbal, tactile cues and demonstration of therapist: goal: strength, independent with home program, improve  LEFS Hip adduction with glute sets x 15 with ball and hold 2 seconds each contraction Hip abduction withgreen resistive band x15 reps, then single leg x 15 reps Knee flexion withgreenresistive band 2 x 20reps PF off balance stones with 6# weight on thighs 2 x 15 DF withgreenresistive band 2x15 reps Sitting crunch/hip flexion with 45 cm ball alternate each hip10x Sitting raise 45 cm ball overhead x 10 Large stability ball for trunk stretches forward and each side with 5 second holds 3 reps each 5x sit to stand from 24" height table, 3 sets alternate  Scapular retraction at door frame with pillow + 2 folded towels behind head x 10 reps  Patient response to treatment: Patient required minimal VC and assistance for correct technique and alignment with exercises. Pain in  lower back decreased to 3/10 following estim., moist heat. Improved posture, more erect with decreased thoracic kyphosis at end of session.      PT Education - 03/22/17 1000    Education provided  Yes    Education Details  exercise instruction, postural education    Person(s) Educated  Patient    Methods  Explanation;Demonstration;Verbal cues    Comprehension  Verbalized understanding;Returned demonstration;Verbal cues required          PT Long Term Goals - 03/20/17 1120      PT LONG TERM GOAL #1   Title   Patient will demonstrate improved function with daily tasks and decreased back pain as indicated by MODI score of 40% or better    Baseline  54%    Status  On-going    Target Date  03/28/17      PT LONG TERM GOAL #2   Title  Patient will demonstrate improved posture awareness and pain control strategies to allow patient to sit/stand for >30 min.    Baseline  unable to sit or stand for 10 min. without increased back pain    Status  On-going    Target Date  03/28/17      PT LONG TERM GOAL #3   Title  Patient will improve 5x sit to stand in 15 seconds or less demonstrating improved functional strength in LE's    Baseline  21 seconds    Status  On-going    Target Date  03/28/17      PT LONG TERM GOAL #4   Title  Patient will be independent with home program for posture awareness, pain control, progressive exercises to allow patient to transition to self management once discharged from physical therapy    Baseline  requires guidance, moderate cuing for exercises, limited knowledge of appropriate pain control strategies, exercises    Status  On-going    Target Date  03/28/17      PT LONG TERM GOAL #5   Title  Patient will report maximal pain level of 5/10 with sitting, standing or walking activities    Baseline  pain level ranges 8-9/10 and constant    Status  On-going    Target Date  03/28/17      PT LONG TERM GOAL #6   Title  Patient will demonstrate decreased pain to  moderate and improved function with less difficulty with MODI score of 30% or better    Baseline  54% initially 02/14/17    Status  On-going    Target Date  03/28/17            Plan - 03/22/17 0950    Clinical Impression Statement  Patient is progressing  well towareds goals with imrpoving setrength and endurance and minimal cuing to perform exercises with correct techniuqe and alignment. Anticipate discharge to self managment next week following re assessment.     Rehab Potential  Fair    Clinical Impairments Affecting Rehab Potential  (+)motivated, prior level of function(-)chronic condition, multiple co morbidities    PT Frequency  2x / week    PT Duration  6 weeks    PT Treatment/Interventions  Moist Heat;Electrical Stimulation;Cryotherapy;Patient/family education;Neuromuscular re-education;Therapeutic exercise;Manual techniques    PT Next Visit Plan  pain control, progress exercises; re assess strength, function with MODI, LEFS and anticipate discharge    PT Home Exercise Plan  stabilization exercises for hip, LE's including strength and flexibility        Patient will benefit from skilled therapeutic intervention in order to improve the following deficits and impairments:  Decreased strength, Decreased activity tolerance, Impaired perceived functional ability, Pain, Obesity, Increased muscle spasms, Decreased endurance, Difficulty walking  Visit Diagnosis: Low back pain with sciatica, sciatica laterality unspecified, unspecified back pain laterality, unspecified chronicity  Difficulty in walking, not elsewhere classified  Muscle weakness (generalized)     Problem List Patient Active Problem List   Diagnosis Date Noted  . CKD (chronic kidney disease) stage 3, GFR 30-59 ml/min ([name]) 07/21/2016  . 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 LVH (left ventricular hypertrophy) due to hypertensive disease, without heart failure 01/19/2016  . Pulmonary  hypertension ([place]) 02/10/2015  . Acquired hypothyroidism 09/17/2014  . Adaptation reaction 09/17/2014  . Absolute anemia 09/17/2014  . Cardiac dysrhythmia 09/17/2014  . Arthropathia 09/17/2014  . 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 Diabetes mellitus, type 2 ([place]) 09/17/2014  . Essential (primary) hypertension 09/17/2014  . HLD (hyperlipidemia) 09/17/2014  . Extreme obesity 09/17/2014  . Vitamin D deficiency 09/17/2014  . Beat, premature ventricular 12/11/2013  . OBESITY 11/14/2007  . ACHILLES BURSITIS OR TENDINITIS 11/14/2007  . BURSITIS, CALCANEAL 06/26/2007    Aldona Lento 03/23/2017, 1:45 PM  [facility] [address] [place], [address] Phone: [phone]   Fax:  [phone]  Name: Carol Ramsey MRN: [id number] Date of Birth: [birth year]

## 2017-03-26 NOTE — Progress Notes (Signed)
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  She is tolerating tamoxifen well without significant side effects. She currently feels well and is asymptomatic. She has no neurologic complaints. She denies any recent fevers or illnesses. She has a good appetite and denies weight loss. She has no chest pain or shortness of breath. She denies any nausea, vomiting, constipation, or diarrhea. She has no urinary complaints. Patient offers no specific complaints today.  REVIEW OF SYSTEMS:   Review of Systems  Constitutional: Negative.  Negative for fever, malaise/fatigue and weight loss.  Respiratory: Negative.  Negative for cough and shortness of breath.   Cardiovascular: Negative.  Negative for chest pain and leg swelling.  Gastrointestinal: Negative.  Negative for abdominal pain.  Genitourinary: Negative.   Musculoskeletal: Negative.   Skin: Negative.  Negative for rash.  Neurological: Negative.  Negative for weakness.  Psychiatric/Behavioral: Negative.  The patient is not nervous/anxious.     As per HPI. Otherwise, a complete review of systems is negative.  PAST MEDICAL HISTORY: Past Medical History:  Diagnosis Date  . Anemia   . Arthritis   . [place]   right breast DCIS grade 3  . 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 Diabetes [place] complication ([place]   .  Hyperlipidemia   . Hypertension   . Hyperthyroidism   . Obesity   . Thyroid disease    hyperthyroidism  . Vitamin D deficiency     PAST SURGICAL HISTORY: Past Surgical History:  Procedure Laterality Date  . BREAST BIOPSY Right 06/28/2016   stereotactic biopsy - DUCTAL CARCINOMA IN SITU (DCIS), HIGH NUCLEAR GRADE   . JOINT REPLACEMENT Left    tkr  . KNEE SURGERY Left    arthroscopy  . MASTECTOMY, PARTIAL Right 07/18/2016   Procedure: MASTECTOMY PARTIAL;  Surgeon: JRobert Bellow MD;  Location: [facility] ORS;  Service: General;  Laterality: Right;  . SKIN CANCER EXCISION     face    FAMILY HISTORY: Family History  Problem Relation Age of Onset  . Hypertension Mother   . Cervical cancer Mother   . Heart disease Father   . Hypertension Father   . Lung cancer Father   . Lung cancer Sister   . Brain cancer Sister   . Diabetes Brother   . Heart disease Brother   . Hypertension Brother   . Stroke Brother     ADVANCED DIRECTIVES (Y/N):  N  HEALTH MAINTENANCE: Social History   Tobacco Use  . Smoking status: Never Smoker  . Smokeless tobacco: Never Used  Substance Use Topics  . Alcohol use: No  . Drug use: No     Colonoscopy:  PAP:  Bone density:  Lipid panel:  Allergies  Allergen Reactions  . Gabapentin Swelling  . Quinine Nausea Only and Nausea And Vomiting  . Amoxicillin Itching and Rash    Has patient had a PCN reaction causing immediate rash, facial/tongue/throat swelling, SOB or lightheadedness with hypotension: No  Has patient had a PCN reaction causing severe rash involving mucus membranes or skin necrosis: No Has patient had a PCN reaction that required hospitalization No Has patient had a PCN reaction occurring within the last 10 years: No If all of the above answers are "NO", then may proceed with Cephalosporin use.   . Dilaudid  [Hydromorphone Hcl] Rash  . Etodolac Rash  . Hydrochlorothiazide Rash  . Hydrocodone Rash  . Hydromorphone Rash  .  Sulfa Antibiotics Rash    Current Outpatient Medications  Medication Sig Dispense Refill  . acetaminophen (TYLENOL) 500 MG tablet Take 500 mg by mouth every 6 (six) hours as needed for mild pain or moderate pain.    [name] aspirin 81 MG tablet Take 81 mg by mouth daily. Hasn't taken in 1 week    . atorvastatin (LIPITOR) 10 MG tablet Take 10 mg by mouth daily at 6 PM.     . Cholecalciferol (VITAMIN D) 2000 units CAPS Take 2,000 Units by mouth daily.     [name] glipiZIDE (GLUCOTROL XL) 5 MG 24 hr tablet TAKE 1 TABLETS BY MOUTH DAILY 90 tablet 3  . glucose blood (ONETOUCH VERIO) test strip Use as instructed; Check blood sugar twice daily (Patient taking differently: Use as instructed; Check blood sugar twice daily) 100 each 12  . [name] 50-1000 MG tablet TAKE 1 TABLET BY MOUTH TWICE DAILY WITH A MEAL 180 tablet 3  . lisinopril-hydrochlorothiazide (PRINZIDE,ZESTORETIC) 20-12.5 MG tablet TAKE 2 TABLETS BY MOUTH EVERY MORNING 60 tablet 11  . meloxicam (MOBIC) 15 MG tablet Take 15 mg by mouth daily.     . metoprolol succinate (TOPROL-XL) 50 MG 24 hr tablet Take 1 tablet (50 mg total) by mouth daily. 90 tablet 3  . pioglitazone (ACTOS) 45 MG tablet TAKE 1 TABLET(45 MG) BY MOUTH DAILY 30 tablet 11  . tamoxifen (NOLVADEX) 20 MG tablet Take 1 tablet (20 mg total) by mouth daily. 30 tablet 11   No current facility-administered medications for this visit.     OBJECTIVE: Vitals:   03/27/17 1059  BP: (!) 159/82  Pulse: 67  Resp: 20  Temp: 97.8 F (36.6 C)     Body mass index is 45.15 kg/m.    ECOG FS:0 - Asymptomatic  General: Well-developed, well-nourished, no acute distress. Eyes: Pink conjunctiva, anicteric sclera. Breasts: Patient declined breast exam today. Lungs: Clear to auscultation bilaterally. Heart: Regular rate and rhythm. No rubs, murmurs, or gallops. Abdomen: Soft, nontender, nondistended. No organomegaly noted, normoactive bowel sounds. Musculoskeletal: No edema, cyanosis, or  clubbing. Neuro: Alert, answering all questions appropriately. Cranial nerves grossly intact. Skin: No rashes or petechiae noted. Psych: Normal affect.   LAB RESULTS:  Lab Results  Component Value Date   NA 138 01/26/2017   K 4.5 01/26/2017   CL 105 01/26/2017   CO2 23 01/26/2017   GLUCOSE 119 (H) 01/26/2017   BUN 25 01/26/2017   CREATININE 1.16 (H) 01/26/2017   CALCIUM 8.6 01/26/2017   PROT 6.3 01/26/2017   ALBUMIN 4.2 03/03/2016   AST 16 01/26/2017   ALT 6 01/26/2017   ALKPHOS 65 03/03/2016   BILITOT 0.6 01/26/2017   GFRNONAA 48 (L) 07/11/2016   GFRAA 56 (L) 07/11/2016    Lab Results  Component Value Date   WBC 4.9 01/26/2017   NEUTROABS 2,710 01/26/2017   HGB 10.7 (L) 01/26/2017   HCT 33.4 (L) 01/26/2017   MCV 86.3 01/26/2017   PLT 210 01/26/2017     STUDIES: No results found.  ASSESSMENT: Right breast high grade DCIS.  PLAN:    1. Right breast high grade DCIS: Final pathology was reviewed independently as also discussed at breast case conference. Patient has close margins less than 0.5 mm. Patient was given the option for reexcision to obtain better margins or proceed directly with XRT. She declined reexcision and subsequently underwent adjuvant XRT.  Continue tamoxifen for a total of 5 years completing in May 2023.  Patient will require repeat mammogram in February 2019.  Return to clinic in 6 months for further evaluation.   Approximately 20 minutes was spent in discussion of which greater than 50% was consultation.  Patient expressed understanding and was in agreement with this plan. She also understands that She can call clinic at any time with any questions, concerns, or complaints.   Cancer Staging Ductal carcinoma in situ (DCIS) of right breast Staging form: Breast, AJCC 8th Edition - Pathologic stage from 08/02/2016: Stage 0 (pTis (DCIS), pN0, cM0, ER: Positive, PR: Positive, HER2: Negative) - Signed by Lloyd Huger, MD on 08/02/2016   Lloyd Huger, MD   03/29/2017 9:59 AM

## 2017-03-27 ENCOUNTER — Encounter: Payer: Self-pay | Admitting: Physical Therapy

## 2017-03-27 ENCOUNTER — Other Ambulatory Visit: Payer: Self-pay

## 2017-03-27 ENCOUNTER — Encounter: Payer: Self-pay | Admitting: Oncology

## 2017-03-27 ENCOUNTER — Ambulatory Visit: Payer: Medicare Other | Admitting: Physical Therapy

## 2017-03-27 ENCOUNTER — Inpatient Hospital Stay: Payer: Medicare Other | Attending: Oncology | Admitting: Oncology

## 2017-03-27 VITALS — BP 159/82 | HR 67 | Temp 97.8°F | Resp 20 | Wt 254.9 lb

## 2017-03-27 DIAGNOSIS — Z170 Estrogen receptor positive status [ER+]: Secondary | ICD-10-CM | POA: Diagnosis not present

## 2017-03-27 DIAGNOSIS — E785 Hyperlipidemia, unspecified: Secondary | ICD-10-CM | POA: Diagnosis not present

## 2017-03-27 DIAGNOSIS — Z7984 Long term (current) use of oral hypoglycemic drugs: Secondary | ICD-10-CM | POA: Diagnosis not present

## 2017-03-27 DIAGNOSIS — E0590 Thyrotoxicosis, unspecified without thyrotoxic crisis or storm: Secondary | ICD-10-CM | POA: Diagnosis not present

## 2017-03-27 DIAGNOSIS — M129 Arthropathy, unspecified: Secondary | ICD-10-CM | POA: Diagnosis not present

## 2017-03-27 DIAGNOSIS — Z79810 Long term (current) use of selective estrogen receptor modulators (SERMs): Secondary | ICD-10-CM | POA: Diagnosis not present

## 2017-03-27 DIAGNOSIS — Z85828 Personal history of other malignant neoplasm of skin: Secondary | ICD-10-CM | POA: Insufficient documentation

## 2017-03-27 DIAGNOSIS — Z9011 Acquired absence of right breast and nipple: Secondary | ICD-10-CM

## 2017-03-27 DIAGNOSIS — R262 Difficulty in walking, not elsewhere classified: Secondary | ICD-10-CM | POA: Diagnosis not present

## 2017-03-27 DIAGNOSIS — M5440 Lumbago with sciatica, unspecified side: Secondary | ICD-10-CM | POA: Diagnosis not present

## 2017-03-27 DIAGNOSIS — Z923 Personal history of irradiation: Secondary | ICD-10-CM | POA: Insufficient documentation

## 2017-03-27 DIAGNOSIS — E119 Type 2 diabetes mellitus without complications: Secondary | ICD-10-CM

## 2017-03-27 DIAGNOSIS — E669 Obesity, unspecified: Secondary | ICD-10-CM | POA: Insufficient documentation

## 2017-03-27 DIAGNOSIS — Z801 Family history of malignant neoplasm of trachea, bronchus and lung: Secondary | ICD-10-CM | POA: Insufficient documentation

## 2017-03-27 DIAGNOSIS — M6281 Muscle weakness (generalized): Secondary | ICD-10-CM

## 2017-03-27 DIAGNOSIS — Z79899 Other long term (current) drug therapy: Secondary | ICD-10-CM | POA: Insufficient documentation

## 2017-03-27 DIAGNOSIS — Z808 Family history of malignant neoplasm of other organs or systems: Secondary | ICD-10-CM | POA: Diagnosis not present

## 2017-03-27 DIAGNOSIS — D0511 Intraductal carcinoma in situ of right breast: Secondary | ICD-10-CM | POA: Diagnosis not present

## 2017-03-27 DIAGNOSIS — I10 Essential (primary) hypertension: Secondary | ICD-10-CM | POA: Diagnosis not present

## 2017-03-27 DIAGNOSIS — D649 Anemia, unspecified: Secondary | ICD-10-CM | POA: Insufficient documentation

## 2017-03-27 DIAGNOSIS — E559 Vitamin D deficiency, unspecified: Secondary | ICD-10-CM | POA: Diagnosis not present

## 2017-03-27 NOTE — Therapy (Signed)
[facility] [address], [place], [address] Phone: [phone]   Fax:  [phone]  Physical Therapy Treatment/Discharge Summary  Patient Details  Name: Carol Ramsey MRN: [id number] Date of Birth: 07-13-1945 Referring Provider: [name] MD   Encounter Date: 03/27/2017   Patient began physical therapy on 02/14/2017 and has attended 12 sessions through 03/27/17. She has achieved/partially met/not met goals as outlined in goal section and is independent in home program for continued self management of pain/symptoms and exercises as instructed. Plan discharge from physical therapy at this time.    PT End of Session - 03/27/17 0900    Visit Number  12    Number of Visits  12    Date for PT Re-Evaluation  03/28/17    Authorization Type  12    Authorization Time Period  20 (G codes)    PT Start Time  0851    PT Stop Time  0937    PT Time Calculation (min)  46 min    Activity Tolerance  Patient tolerated treatment well;Patient limited by fatigue;Patient limited by pain    Behavior During Therapy  [facility] for tasks assessed/performed       Past Medical History:  Diagnosis Date  . Anemia   . Arthritis   . Breast cancer ([place]) 06/28/2016   right breast DCIS grade 3  . 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 Diabetes mellitus without complication ([place])   . Hyperlipidemia   . Hypertension   . Hyperthyroidism   . Obesity   . Thyroid disease    hyperthyroidism  . Vitamin D deficiency     Past Surgical History:  Procedure Laterality Date  . BREAST BIOPSY Right 06/28/2016   stereotactic biopsy - DUCTAL CARCINOMA IN SITU (DCIS), HIGH NUCLEAR GRADE   . JOINT REPLACEMENT Left    tkr  . KNEE SURGERY Left    arthroscopy  . MASTECTOMY, PARTIAL Right 07/18/2016   Procedure: MASTECTOMY PARTIAL;  Surgeon: Robert Bellow, MD;  Location: [facility] ORS;  Service: General;  Laterality: Right;  . SKIN CANCER EXCISION      face    There were no vitals filed for this visit.  Subjective Assessment - 03/27/17 0857    Subjective  Patient reports back soreness, may be due to rainy, cold weather. She is doing well with exercises and agrees to discharge from physical therapy at this time.    Pertinent History  history of back pain; most recent episode began   and now she is having increased pain into bilateral buttocks with sitting, quickly resolves    Limitations  Sitting;Standing;Walking;House hold activities    How long can you sit comfortably?  as long as she would like with choice of seating surfaces    How long can you stand comfortably?  10 minutes or less    How long can you walk comfortably?  no more than short distances    Patient Stated Goals  walk further with less pain, difficulty    Currently in Pain?  Yes    Pain Score  5     Pain Location  Back    Pain Orientation  Lower    Pain Descriptors / Indicators  Aching;Sore    Pain Type  Chronic pain    Pain Onset  More than a month ago    Pain Frequency  Intermittent         Observation: Posture:  Forward flexed posture with increased thoracic kyphosis with standing /walking MODI 40% (initially 54%: significant change) 5x sit to stand, elevated surface 22" no use of UE's 16.6 seconds ( initially 21 seconds)    Treatment: Modalities: Sitting:in conjunction with exercises:moist heatand HV estimx 20 min.(4) electrodes applied to lower back with patientseatedin chair, intensity to tolerance (180 volts);no adverse reactions noted; goal: pain control Therapeutic exercise: patient performed exercises with verbal, tactile cues and demonstration of therapist: goal: strength, independent with home program, improve LEFS Hip adduction with glute sets x 15 with ball and hold 2 seconds each contraction Hip abduction withgreen resistive band x25 reps Knee flexion withgreenresistive band 2 x 20reps PF off balance stones with 6# weight on  thighs 2 x 15 DF withgreen/redresistive band 2x15 reps with instruction to perform independently at home Sitting crunch/hip flexion with 45 cm ball alternate each hip10x Sitting raise 45 cm ball overhead x 10 Large stability ball for trunk stretches forward and each side with 5 second holds 3 reps each  Patient response to treatment: Patient demonstrates good understanding of exercises with minimal to no cuing to perform with correct technique.  Pain in lower back decreased to 3/10 following estim., moist heat. Improved posture, more erect with decreased thoracic kyphosis at end of session.       PT Education - 03/27/17 0859    Education provided  Yes    Education Details  re assessed home program with written instructions    Person(s) Educated  Patient    Methods  Explanation;Demonstration;Verbal cues    Comprehension  Verbalized understanding;Returned demonstration;Verbal cues required          PT Long Term Goals - 03/27/17 0940      PT LONG TERM GOAL #1   Title   Patient will demonstrate improved function with daily tasks and decreased back pain as indicated by MODI score of 40% or better    Baseline  54%; 03/27/17 40%    Status  Achieved      PT LONG TERM GOAL #2   Title  Patient will demonstrate improved posture awareness and pain control strategies to allow patient to sit/stand for >30 min.    Baseline  unable to sit or stand for 10 min. without increased back pain; intermittently able to sit and stand for longer periods    Status  Partially Met      PT LONG TERM GOAL #3   Title  Patient will improve 5x sit to stand in 15 seconds or less demonstrating improved functional strength in LE's    Baseline  21 seconds/ 03/27/17 16.6 seconds    Status  Partially Met      PT LONG TERM GOAL #4   Title  Patient will be independent with home program for posture awareness, pain control, progressive exercises to allow patient to transition to self management once discharged  from physical therapy    Baseline  requires guidance, moderate cuing for exercises, limited knowledge of appropriate pain control strategies, exercises    Status  Achieved      PT LONG TERM GOAL #5   Title  Patient will report maximal pain level of 5/10 with sitting, standing or walking activities    Baseline  pain level ranges 8-9/10 and constant; 03/27/17 5/10 reported consistently     Status  Achieved      PT LONG TERM GOAL #6   Title  Patient will demonstrate decreased pain to moderate and improved function with less difficulty  with MODI score of 30% or better    Baseline  54% initially 02/14/17    Status  Not Met            Plan - 16-Apr-2017 2706    Clinical Impression Statement  Patient has achieved or partially met most goals and is independent with home exercises. She is ready for discharge from physical therapy at this time.     Rehab Potential  Fair    Clinical Impairments Affecting Rehab Potential  (+)motivated, prior level of function(-)chronic condition, multiple co morbidities    PT Frequency  2x / week    PT Duration  6 weeks    PT Treatment/Interventions  Moist Heat;Electrical Stimulation;Cryotherapy;Patient/family education;Neuromuscular re-education;Therapeutic exercise;Manual techniques    PT Next Visit Plan  pain control, progress exercises; re assess strength, function with MODI, LEFS and anticipate discharge    PT Home Exercise Plan  stabilization exercises for hip, LE's including strength and flexibility     Consulted and Agree with Plan of Care  Patient       Patient will benefit from skilled therapeutic intervention in order to improve the following deficits and impairments:  Decreased strength, Decreased activity tolerance, Impaired perceived functional ability, Pain, Obesity, Increased muscle spasms, Decreased endurance, Difficulty walking  Visit Diagnosis: Low back pain with sciatica, sciatica laterality unspecified, unspecified back pain laterality,  unspecified chronicity  Difficulty in walking, not elsewhere classified  Muscle weakness (generalized)   G-Codes - 16-Apr-2017 1613    Functional Assessment Tool Used (Outpatient Only)  MODI, strength, pain, ROM, clinical judgment    Functional Limitation  Mobility: Walking and moving around    Mobility: Walking and Moving Around Goal Status [phone])  At least 20 percent but less than 40 percent impaired, limited or restricted    Mobility: Walking and Moving Around Discharge Status [phone])  At least 20 percent but less than 40 percent impaired, limited or restricted       Problem List Patient Active Problem List   Diagnosis Date Noted  . CKD (chronic kidney disease) stage 3, GFR 30-59 ml/min ([name]) 07/21/2016  . 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 LVH (left ventricular hypertrophy) due to hypertensive disease, without heart failure 01/19/2016  . Pulmonary hypertension ([place]) 02/10/2015  . Acquired hypothyroidism 09/17/2014  . Adaptation reaction 09/17/2014  . Absolute anemia 09/17/2014  . Cardiac dysrhythmia 09/17/2014  . Arthropathia 09/17/2014  . 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 Diabetes mellitus, type 2 ([place]) 09/17/2014  . Essential (primary) hypertension 09/17/2014  . HLD (hyperlipidemia) 09/17/2014  . Extreme obesity 09/17/2014  . Vitamin D deficiency 09/17/2014  . Beat, premature ventricular 12/11/2013  . OBESITY 11/14/2007  . ACHILLES BURSITIS OR TENDINITIS 11/14/2007  . Bonita Quin 06/26/2007    Jomarie Longs PT 16-Apr-2017, 4:14 PM  [facility] [address] [place], [address] Phone: [phone]   Fax:  [phone]  Name: Carol Ramsey MRN: [id number] Date of Birth: [birth year]

## 2017-03-27 NOTE — Progress Notes (Signed)
Patient reports back pain today, has been to physical therapy. Patient reports she is tolerating her Tamoxifen well.

## 2017-03-28 ENCOUNTER — Encounter: Payer: Medicare Other | Admitting: Physical Therapy

## 2017-03-29 ENCOUNTER — Encounter: Payer: Medicare Other | Admitting: Physical Therapy

## 2017-04-03 ENCOUNTER — Encounter: Payer: Medicare Other | Admitting: Physical Therapy

## 2017-04-06 ENCOUNTER — Encounter: Payer: Medicare Other | Admitting: Physical Therapy

## 2017-05-10 ENCOUNTER — Other Ambulatory Visit: Payer: Self-pay

## 2017-05-10 ENCOUNTER — Encounter: Payer: Self-pay | Admitting: Radiation Oncology

## 2017-05-10 ENCOUNTER — Ambulatory Visit
Admission: RE | Admit: 2017-05-10 | Discharge: 2017-05-10 | Disposition: A | Discharge status: Home / Self Care | Payer: Medicare Other | Source: Ambulatory Visit | Attending: Radiation Oncology | Admitting: Radiation Oncology

## 2017-05-10 VITALS — BP 183/75 | HR 55 | Temp 96.5°F | Resp 20 | Wt 252.8 lb

## 2017-05-10 DIAGNOSIS — Z79810 Long term (current) use of selective estrogen receptor modulators (SERMs): Secondary | ICD-10-CM | POA: Diagnosis not present

## 2017-05-10 DIAGNOSIS — D0511 Intraductal carcinoma in situ of right breast: Secondary | ICD-10-CM | POA: Insufficient documentation

## 2017-05-10 DIAGNOSIS — M549 Dorsalgia, unspecified: Secondary | ICD-10-CM | POA: Diagnosis not present

## 2017-05-10 DIAGNOSIS — Z923 Personal history of irradiation: Secondary | ICD-10-CM | POA: Insufficient documentation

## 2017-05-10 DIAGNOSIS — Z170 Estrogen receptor positive status [ER+]: Secondary | ICD-10-CM | POA: Insufficient documentation

## 2017-05-10 NOTE — Progress Notes (Signed)
Radiation Oncology Follow up Note  Name: Carol Ramsey   Date:   05/10/2017 MRN:  [id number] DOB: 07-17-1945    This 72 y.o. female presents to the clinic today for six-month follow-up status post whole breast radiation to her right breast for ER/PR positive ductal carcinoma in situ  REFERRING PROVIDER: Jerrol Banana.,*  HPI: Patient is a 72 year old female now out 6 months having completed whole breast radiation to her right breast for ER/PR positive ductal carcinoma in situ. She is seen today in routine follow-up and from a breast standpoint is doing well. She continues to have issues with back pain causing significant discomfort especially in the morning hours. She specifically denies breast tenderness cough or bone pain.. She is currently on tamoxifen tolerating that well without side effect. She is scheduled for mammograms in February.  COMPLICATIONS OF TREATMENT: none  FOLLOW UP COMPLIANCE: keeps appointments   PHYSICAL EXAM:  BP (!) 183/75   Pulse (!) 55   Temp (!) 96.5 F (35.8 C)   Resp 20   Wt 252 lb 12.1 oz (114.7 kg)   BMI 44.77 kg/m  Lungs are clear to A&P cardiac examination essentially unremarkable with regular rate and rhythm. No dominant mass or nodularity is noted in either breast in 2 positions examined. Incision is well-healed. No axillary or supraclavicular adenopathy is appreciated. Cosmetic result is excellent. Well-developed well-nourished patient in NAD. HEENT reveals PERLA, EOMI, discs not visualized.  Oral cavity is clear. No oral mucosal lesions are identified. Neck is clear without evidence of cervical or supraclavicular adenopathy. Lungs are clear to A&P. Cardiac examination is essentially unremarkable with regular rate and rhythm without murmur rub or thrill. Abdomen is benign with no organomegaly or masses noted. Motor sensory and DTR levels are equal and symmetric in the upper and lower extremities. Cranial nerves II through XII are grossly intact.  Proprioception is intact. No peripheral adenopathy or edema is identified. No motor or sensory levels are noted. Crude visual fields are within normal range.  RADIOLOGY RESULTS: No current films for review  PLAN: At the present time patient is doing well with no evidence of disease. I'm please were overall progress. I've asked to see her back in 6 months for follow-up and then will start once your follow-up visits. I will review her mammograms when available. She continues on tamoxifen without side effect. Patient knows to call with any concerns.  I would like to take this opportunity to thank you for allowing me to participate in the care of your patient.Noreene Filbert, MD

## 2017-05-22 ENCOUNTER — Ambulatory Visit (INDEPENDENT_AMBULATORY_CARE_PROVIDER_SITE_OTHER): Payer: Medicare Other | Admitting: Family Medicine

## 2017-05-22 ENCOUNTER — Ambulatory Visit: Payer: Self-pay | Admitting: Family Medicine

## 2017-05-22 ENCOUNTER — Other Ambulatory Visit: Payer: Self-pay

## 2017-05-22 VITALS — BP 152/70 | HR 76 | Temp 97.6°F | Resp 18 | Wt 252.0 lb

## 2017-05-22 DIAGNOSIS — E119 Type 2 diabetes mellitus without complications: Secondary | ICD-10-CM

## 2017-05-22 DIAGNOSIS — M5440 Lumbago with sciatica, unspecified side: Secondary | ICD-10-CM | POA: Diagnosis not present

## 2017-05-22 DIAGNOSIS — I10 Essential (primary) hypertension: Secondary | ICD-10-CM

## 2017-05-22 DIAGNOSIS — E668 Other obesity: Secondary | ICD-10-CM

## 2017-05-22 DIAGNOSIS — G8929 Other chronic pain: Secondary | ICD-10-CM

## 2017-05-22 LAB — POCT GLYCOSYLATED HEMOGLOBIN (HGB A1C): Hemoglobin A1C: 7.2

## 2017-05-22 MED ORDER — METOPROLOL SUCCINATE ER 100 MG PO TB24: 100.0000 mg | ORAL_TABLET | Freq: Every day | ORAL | 3 refills | Status: DC

## 2017-05-22 NOTE — Progress Notes (Signed)
Carol Ramsey  MRN: [id number] DOB: 10-08-1945  Subjective:  HPI   The patient is a 72 year old female who presents for follow up of her chronic illnesses.  She was last seen on 01/26/17.  Diabetes-Her last A1C in September was 6.4.  She checks her glucose on a regular basis and gets readings that range from 70-160.  No episodes suggestive of hypoglycemic events.    Hypertension-Patient is not at goal with her blood pressure readings.  She is on Lisinopril HCTZ 20/12.5 mg 2 q day and Metoprolol succinate 50.  BP Readings from Last 3 Encounters:  05/22/17 (!) 152/70  05/10/17 (!) 183/75  03/27/17 (!) 159/82     Patient Active Problem List   Diagnosis Date Noted  . CKD (chronic kidney disease) stage 3, GFR 30-59 ml/min ([name]) 07/21/2016  . 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 LVH (left ventricular hypertrophy) due to hypertensive disease, without heart failure 01/19/2016  . Pulmonary hypertension ([place]) 02/10/2015  . Acquired hypothyroidism 09/17/2014  . Adaptation reaction 09/17/2014  . Absolute anemia 09/17/2014  . Cardiac dysrhythmia 09/17/2014  . Arthropathia 09/17/2014  . 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 Diabetes mellitus, type 2 ([place]) 09/17/2014  . Essential (primary) hypertension 09/17/2014  . HLD (hyperlipidemia) 09/17/2014  . Extreme obesity 09/17/2014  . Vitamin D deficiency 09/17/2014  . Beat, premature ventricular 12/11/2013  . OBESITY 11/14/2007  . ACHILLES BURSITIS OR TENDINITIS 11/14/2007  . BURSITIS, CALCANEAL 06/26/2007    Past Medical History:  Diagnosis Date  . Anemia   . Arthritis   . Breast cancer ([place]) 06/28/2016   right breast DCIS grade 3  . 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 Diabetes mellitus without complication ([place])   . Hyperlipidemia   . Hypertension   . Hyperthyroidism   . Obesity   . Thyroid disease    hyperthyroidism  . Vitamin D deficiency     Social History   Socioeconomic  History  . Marital status: Married    Spouse name: Not on file  . Number of children: Not on file  . Years of education: Not on file  . Highest education level: Not on file  Social Needs  . Financial resource strain: Not on file  . Food insecurity - worry: Not on file  . Food insecurity - inability: Not on file  . Transportation needs - medical: Not on file  . Transportation needs - non-medical: Not on file  Occupational History  . Not on file  Tobacco Use  . Smoking status: Never Smoker  . Smokeless tobacco: Never Used  Substance and Sexual Activity  . Alcohol use: No  . Drug use: No  . Sexual activity: No  Other Topics Concern  . Not on file  Social History Narrative  . Not on file    Outpatient Encounter Medications as of 05/22/2017  Medication Sig Note  . acetaminophen (TYLENOL) 500 MG tablet Take 500 mg by mouth every 6 (six) hours as needed for mild pain or moderate pain.   [name] aspirin 81 MG tablet Take 81 mg by mouth daily. Hasn't taken in 1 week 09/17/2014: Received from: [facility]  . atorvastatin (LIPITOR) 10 MG tablet Take 10 mg by mouth daily at 6 PM.  09/17/2014: Received from: [facility]  . Cholecalciferol (VITAMIN D) 2000 units CAPS Take 2,000 Units by mouth daily.    [name] glipiZIDE (GLUCOTROL XL)  5 MG 24 hr tablet TAKE 1 TABLETS BY MOUTH DAILY   . glucose blood (ONETOUCH VERIO) test strip Use as instructed; Check blood sugar twice daily (Patient taking differently: Use as instructed; Check blood sugar twice daily)   . [name] 50-1000 MG tablet TAKE 1 TABLET BY MOUTH TWICE DAILY WITH A MEAL   . lisinopril-hydrochlorothiazide (PRINZIDE,ZESTORETIC) 20-12.5 MG tablet TAKE 2 TABLETS BY MOUTH EVERY MORNING   . meloxicam (MOBIC) 15 MG tablet Take 15 mg by mouth daily.    . metoprolol succinate (TOPROL-XL) 50 MG 24 hr tablet Take 1 tablet (50 mg total) by mouth daily.   . pioglitazone (ACTOS) 45 MG tablet TAKE 1 TABLET(45 MG) BY MOUTH DAILY    . tamoxifen (NOLVADEX) 20 MG tablet Take 1 tablet (20 mg total) by mouth daily.    No facility-administered encounter medications on file as of 05/22/2017.     Allergies  Allergen Reactions  . Gabapentin Swelling  . Quinine Nausea Only and Nausea And Vomiting  . Amoxicillin Itching and Rash    Has patient had a PCN reaction causing immediate rash, facial/tongue/throat swelling, SOB or lightheadedness with hypotension: No Has patient had a PCN reaction causing severe rash involving mucus membranes or skin necrosis: No Has patient had a PCN reaction that required hospitalization No Has patient had a PCN reaction occurring within the last 10 years: No If all of the above answers are "NO", then may proceed with Cephalosporin use.   . Dilaudid  [Hydromorphone Hcl] Rash  . Etodolac Rash  . Hydrochlorothiazide Rash  . Hydrocodone Rash  . Hydromorphone Rash  . Sulfa Antibiotics Rash    Review of Systems  Constitutional: Negative for fever and malaise/fatigue.  Respiratory: Negative for cough, shortness of breath and wheezing.   Cardiovascular: Negative for chest pain, palpitations, orthopnea, claudication and leg swelling.  Genitourinary: Negative for frequency.  Musculoskeletal: Positive for back pain.  Neurological: Negative.  Negative for weakness.  Endo/Heme/Allergies: Negative for polydipsia.  Psychiatric/Behavioral: Negative.     Objective:  BP (!) 152/70 (BP Location: Right Arm, Patient Position: Sitting, Cuff Size: Normal)   Pulse 76   Temp 97.6 F (36.4 C) (Oral)   Resp 18   Wt 252 lb (114.3 kg)   BMI 44.64 kg/m   Physical Exam  Constitutional: She is oriented to person, place, and time and well-developed, well-nourished, and in no distress.  HENT:  Head: Normocephalic and atraumatic.  Right Ear: External ear normal.  Left Ear: External ear normal.  Nose: Nose normal.  Eyes: Conjunctivae are normal. Pupils are equal, round, and reactive to light. No scleral  icterus.  Neck: Normal range of motion.  Cardiovascular: Normal rate, regular rhythm and normal heart sounds.  Pulmonary/Chest: Effort normal and breath sounds normal.  Abdominal: Soft.  Musculoskeletal: She exhibits edema (1+).  Neurological: She is alert and oriented to person, place, and time. Gait normal. GCS score is 15.  Skin: Skin is warm and dry.  Psychiatric: Mood, memory, affect and judgment normal.    Assessment and Plan :  1. Type 2 diabetes mellitus without complication, without long-term current use of insulin ([name])  - POCT glycosylated hemoglobin (Hb A1C)--7.2 today.  2. Essential (primary) hypertension  - metoprolol succinate (TOPROL-XL) 100 MG 24 hr tablet; Take 1 tablet (100 mg total) by mouth daily. Take with or immediately following a meal.  Dispense: 90 tablet; Refill: 3 3.Morbid Obesity 4.Chronic Back Pain Limited option--has seen physiatry and neurosurgery. Weight loss and PT.  I  have done the exam and reviewed the chart and it is accurate to the best of my knowledge. Development worker, community has been used and  any errors in dictation or transcription are unintentional. [name] M.D. [facility]

## 2017-05-23 ENCOUNTER — Telehealth: Payer: Self-pay | Admitting: Family Medicine

## 2017-05-23 NOTE — Telephone Encounter (Signed)
Pt stated at her OV yesterday Dr. Rosanna Randy discussed pt trying Voltaren gel for her back pain. Pt is requesting it be sent to [facility]. Pt stated she knows it might be costly but if it might help with her pain she would like to try it. Please advise. Thanks TNP

## 2017-05-23 NOTE — Telephone Encounter (Signed)
Dr Rosanna Randy, I spoke with her and apologized because I thought we had decided not to order anything due to the cost.  She said she would like to see if she could get something depending on just how expensive it is.  I went to send in the Voltaren because I knew you were ok with it yesterday.  However, she is allergic to Etodolac.  While talking with her she said if there is another topical or one of the patches that she could try she would like to see if she can.  I can see if there is any other gel otherwise I can do one of the patches if you like

## 2017-05-25 ENCOUNTER — Other Ambulatory Visit: Payer: Self-pay

## 2017-05-25 NOTE — Telephone Encounter (Signed)
With a "history of "allergy to etodolac I do not know of any other medication topically that I could prescribe.

## 2017-05-25 NOTE — Telephone Encounter (Signed)
Pt advised.

## 2017-05-25 NOTE — Telephone Encounter (Signed)
Pt called back for an update on the medication that she had requested earlier this week. Pt stated we were going to see if there was a cream or a patch that she could try.   I spoke with Jiles Garter and was advised that she would ask Dr. Rosanna Randy and call pt back. Pt was advised and voiced understanding. Thanks TNP

## 2017-05-25 NOTE — Telephone Encounter (Signed)
Patient is calling back.  What would you like me to call in? Thanks

## 2017-05-29 ENCOUNTER — Telehealth: Payer: Self-pay | Admitting: Family Medicine

## 2017-06-12 ENCOUNTER — Other Ambulatory Visit: Payer: Medicare Other

## 2017-06-12 NOTE — Telephone Encounter (Signed)
Pt is scheduled for AWV with [facility] on 09/18/17 @ 10 am. Thanks TNP

## 2017-06-14 ENCOUNTER — Ambulatory Visit
Admission: RE | Admit: 2017-06-14 | Discharge: 2017-06-14 | Disposition: A | Discharge status: Home / Self Care | Payer: Medicare Other | Source: Ambulatory Visit | Attending: Oncology | Admitting: Oncology

## 2017-06-14 DIAGNOSIS — R928 Other abnormal and inconclusive findings on diagnostic imaging of breast: Secondary | ICD-10-CM | POA: Diagnosis not present

## 2017-06-14 DIAGNOSIS — D0511 Intraductal carcinoma in situ of right breast: Secondary | ICD-10-CM | POA: Diagnosis not present

## 2017-06-14 HISTORY — DX: Personal history of irradiation: Z92.3

## 2017-06-15 ENCOUNTER — Encounter: Payer: Self-pay | Admitting: Family Medicine

## 2017-06-15 ENCOUNTER — Ambulatory Visit (INDEPENDENT_AMBULATORY_CARE_PROVIDER_SITE_OTHER): Payer: Medicare Other | Admitting: Family Medicine

## 2017-06-15 VITALS — BP 126/64

## 2017-06-15 DIAGNOSIS — M545 Low back pain: Secondary | ICD-10-CM

## 2017-06-15 DIAGNOSIS — H6123 Impacted cerumen, bilateral: Secondary | ICD-10-CM | POA: Diagnosis not present

## 2017-06-15 MED ORDER — CYCLOBENZAPRINE HCL 5 MG PO TABS
5.0000 mg | ORAL_TABLET | Freq: Every day | ORAL | 1 refills | Status: DC
Start: 2017-06-15 — End: 2017-09-18

## 2017-06-15 NOTE — Progress Notes (Signed)
Patient: Carol Ramsey Female    DOB: [birth year]   71 y.o.   MRN: [id number] Visit Date: 06/15/2017  Today's Provider: [name], MD   Chief Complaint  Patient presents with  . Ear Drainage   Subjective:    HPI  Patient reports that over the last 2 weeks, she has felt like her right ear has been "clogged". She has not used anything OTC for her symptoms. She reports that her ears often gets full of wax, and has to get them cleaned out at least twice a year.   Patient also reports that she traveled to [place] over the last week, and has developed low back pain due to the long car rides. She is requesting that something be called in for this.    Allergies  Allergen Reactions  . Gabapentin Swelling  . Quinine Nausea Only and Nausea And Vomiting  . Amoxicillin Itching and Rash    Has patient had a PCN reaction causing immediate rash, facial/tongue/throat swelling, SOB or lightheadedness with hypotension: No Has patient had a PCN reaction causing severe rash involving mucus membranes or skin necrosis: No Has patient had a PCN reaction that required hospitalization No Has patient had a PCN reaction occurring within the last 10 years: No If all of the above answers are "NO", then may proceed with Cephalosporin use.   . Dilaudid  [Hydromorphone Hcl] Rash  . Etodolac Rash  . Hydrochlorothiazide Rash  . Hydrocodone Rash  . Hydromorphone Rash  . Sulfa Antibiotics Rash     Current Outpatient Medications:  .  acetaminophen (TYLENOL) 500 MG tablet, Take 500 mg by mouth every 6 (six) hours as needed for mild pain or moderate pain., Disp: , Rfl:  .  aspirin 81 MG tablet, Take 81 mg by mouth daily. Hasn't taken in 1 week, Disp: , Rfl:  .  atorvastatin (LIPITOR) 10 MG tablet, Take 10 mg by mouth daily at 6 PM. , Disp: , Rfl:  .  Cholecalciferol (VITAMIN D) 2000 units CAPS, Take 2,000 Units by mouth daily. , Disp: , Rfl:  .  glipiZIDE (GLUCOTROL XL) 5 MG 24 hr tablet, TAKE 1  TABLETS BY MOUTH DAILY, Disp: 90 tablet, Rfl: 3 .  glucose blood (ONETOUCH VERIO) test strip, Use as instructed; Check blood sugar twice daily (Patient taking differently: Use as instructed; Check blood sugar twice daily), Disp: 100 each, Rfl: 12 .  [name] 50-1000 MG tablet, TAKE 1 TABLET BY MOUTH TWICE DAILY WITH A MEAL, Disp: 180 tablet, Rfl: 3 .  lisinopril-hydrochlorothiazide (PRINZIDE,ZESTORETIC) 20-12.5 MG tablet, TAKE 2 TABLETS BY MOUTH EVERY MORNING, Disp: 60 tablet, Rfl: 11 .  meloxicam (MOBIC) 15 MG tablet, Take 15 mg by mouth daily. , Disp: , Rfl:  .  metoprolol succinate (TOPROL-XL) 100 MG 24 hr tablet, Take 1 tablet (100 mg total) by mouth daily. Take with or immediately following a meal., Disp: 90 tablet, Rfl: 3 .  pioglitazone (ACTOS) 45 MG tablet, TAKE 1 TABLET(45 MG) BY MOUTH DAILY, Disp: 30 tablet, Rfl: 11 .  tamoxifen (NOLVADEX) 20 MG tablet, Take 1 tablet (20 mg total) by mouth daily., Disp: 30 tablet, Rfl: 11 .  cyclobenzaprine (FLEXERIL) 5 MG tablet, Take 1 tablet (5 mg total) by mouth at bedtime., Disp: 30 tablet, Rfl: 1  Review of Systems  Constitutional: Negative.   HENT: Positive for ear discharge and hearing loss.        Due to excessive ear wax.   Musculoskeletal:  Positive for back pain and myalgias.    Social History   Tobacco Use  . Smoking status: Never Smoker  . Smokeless tobacco: Never Used  Substance Use Topics  . Alcohol use: No   Objective:   BP 126/64  Vitals:   06/15/17 1159  BP: 126/64     Physical Exam  Constitutional: She appears well-developed and well-nourished.  HENT:  Head: Normocephalic and atraumatic.  Right Ear: External ear normal.  Left Ear: External ear normal.  Nose: Nose normal.  Both ears partially blocked with cerumen.  Eyes: Conjunctivae are normal. No scleral icterus.  Neck: No thyromegaly present.        Assessment & Plan:     Cermenosis Irrigated. Chronic back pain      [name], MD    [facility]

## 2017-06-15 NOTE — Patient Instructions (Signed)
Debrox OTC and soak for 10-15 mins at home. Ear will come clean in the shower.

## 2017-06-20 ENCOUNTER — Encounter: Payer: Self-pay | Admitting: *Deleted

## 2017-06-27 DIAGNOSIS — L821 Other seborrheic keratosis: Secondary | ICD-10-CM | POA: Diagnosis not present

## 2017-06-27 DIAGNOSIS — D2261 Melanocytic nevi of right upper limb, including shoulder: Secondary | ICD-10-CM | POA: Diagnosis not present

## 2017-06-27 DIAGNOSIS — L570 Actinic keratosis: Secondary | ICD-10-CM | POA: Diagnosis not present

## 2017-06-27 DIAGNOSIS — D045 Carcinoma in situ of skin of trunk: Secondary | ICD-10-CM | POA: Diagnosis not present

## 2017-06-27 DIAGNOSIS — Z85828 Personal history of other malignant neoplasm of skin: Secondary | ICD-10-CM | POA: Diagnosis not present

## 2017-06-27 DIAGNOSIS — D225 Melanocytic nevi of trunk: Secondary | ICD-10-CM | POA: Diagnosis not present

## 2017-06-27 DIAGNOSIS — D485 Neoplasm of uncertain behavior of skin: Secondary | ICD-10-CM | POA: Diagnosis not present

## 2017-06-27 DIAGNOSIS — X32XXXA Exposure to sunlight, initial encounter: Secondary | ICD-10-CM | POA: Diagnosis not present

## 2017-07-03 ENCOUNTER — Other Ambulatory Visit: Payer: Self-pay | Admitting: Family Medicine

## 2017-07-05 DIAGNOSIS — D045 Carcinoma in situ of skin of trunk: Secondary | ICD-10-CM | POA: Diagnosis not present

## 2017-07-25 DIAGNOSIS — Z6841 Body Mass Index (BMI) 40.0 and over, adult: Secondary | ICD-10-CM | POA: Diagnosis not present

## 2017-07-25 DIAGNOSIS — Z96652 Presence of left artificial knee joint: Secondary | ICD-10-CM | POA: Diagnosis not present

## 2017-08-08 ENCOUNTER — Encounter: Payer: Self-pay | Admitting: Family Medicine

## 2017-08-10 NOTE — Telephone Encounter (Signed)
Try 10mg  flexeril q 8 hours prn,#60,3rf.

## 2017-08-15 DIAGNOSIS — N183 Chronic kidney disease, stage 3 (moderate): Secondary | ICD-10-CM | POA: Diagnosis not present

## 2017-08-15 DIAGNOSIS — E782 Mixed hyperlipidemia: Secondary | ICD-10-CM | POA: Diagnosis not present

## 2017-08-15 DIAGNOSIS — I10 Essential (primary) hypertension: Secondary | ICD-10-CM | POA: Diagnosis not present

## 2017-08-15 DIAGNOSIS — I493 Ventricular premature depolarization: Secondary | ICD-10-CM | POA: Diagnosis not present

## 2017-09-05 ENCOUNTER — Other Ambulatory Visit: Payer: Self-pay | Admitting: Oncology

## 2017-09-17 NOTE — Progress Notes (Signed)
[facility]  [phone] [phone]  ID: Camren L Haque OB: 04/04/1946  MR#: [id number]  TIW#:[id number]  Patient Care Team: Jerrol Banana., MD as PCP - General (Family Medicine) Marry Guan, Laurice Record, MD as Consulting Physician (Orthopedic Surgery) Dasher, Rayvon Char, MD as Consulting Physician (Dermatology) Corey Skains, MD as Consulting Physician (Cardiology) Thelma Comp, OD as Consulting Physician (Optometry) Byrnett, Forest Gleason, MD (General Surgery) Noreene Filbert, MD as Referring Physician (Radiation Oncology) Lloyd Huger, MD as Consulting Physician (Oncology)  CHIEF COMPLAINT: Right breast high grade DCIS  INTERVAL HISTORY: Patient returns to clinic today for routine six-month evaluation.  She continues to tolerate tamoxifen well without significant side effects. She currently feels well and is asymptomatic. She has no neurologic complaints. She denies any recent fevers or illnesses. She has a good appetite and denies weight loss. She has no chest pain or shortness of breath. She denies any nausea, vomiting, constipation, or diarrhea. She has no urinary complaints.  Patient feels at her baseline offers no specific complaints today.  REVIEW OF SYSTEMS:   Review of Systems  Constitutional: Negative.  Negative for fever, malaise/fatigue and weight loss.  Respiratory: Negative.  Negative for cough and shortness of breath.   Cardiovascular: Negative.  Negative for chest pain and leg swelling.  Gastrointestinal: Negative.  Negative for abdominal pain.  Genitourinary: Negative.  Negative for dysuria.  Musculoskeletal: Negative.  Negative for back pain.  Skin: Negative.  Negative for rash.  Neurological: Negative.  Negative for sensory change, focal weakness and weakness.  Psychiatric/Behavioral: Negative.  The patient is not nervous/anxious.     As per HPI. Otherwise, a complete review of systems is negative.  PAST MEDICAL  HISTORY: Past Medical History:  Diagnosis Date  . Anemia   . Arthritis   . Breast cancer ([place]) 06/28/2016   right breast DCIS grade 3  . 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 Diabetes mellitus without complication ([place])   . Hyperlipidemia   . Hypertension   . Hyperthyroidism   . Obesity   . Personal history of radiation therapy   . Thyroid disease    hyperthyroidism  . Vitamin D deficiency     PAST SURGICAL HISTORY: Past Surgical History:  Procedure Laterality Date  . BREAST BIOPSY Right 06/28/2016   stereotactic biopsy - DUCTAL CARCINOMA IN SITU (DCIS), HIGH NUCLEAR GRADE  (very narrow margins)  . BREAST LUMPECTOMY Right 07/18/2016  . JOINT REPLACEMENT Left    tkr  . KNEE SURGERY Left    arthroscopy  . MASTECTOMY, PARTIAL Right 07/18/2016   Procedure: MASTECTOMY PARTIAL;  Surgeon: Robert Bellow, MD;  Location: [facility] ORS;  Service: General;  Laterality: Right;  . SKIN CANCER EXCISION     face    FAMILY HISTORY: Family History  Problem Relation Age of Onset  . Hypertension Mother   . Cervical cancer Mother   . Heart disease Father   . Hypertension Father   . Lung cancer Father   . Lung cancer Sister   . Brain cancer Sister   . Diabetes Brother   . Heart disease Brother   . Hypertension Brother   . Stroke Brother   . Breast cancer Neg Hx     ADVANCED DIRECTIVES (Y/N):  N  HEALTH MAINTENANCE: Social History   Tobacco Use  . Smoking status: Never Smoker  . Smokeless tobacco: Never Used  Substance Use Topics  . Alcohol use: No  . Drug use:  No     Colonoscopy:  PAP:  Bone density:  Lipid panel:  Allergies  Allergen Reactions  . Gabapentin Swelling  . Quinine Nausea Only and Nausea And Vomiting  . Amoxicillin Itching and Rash    Has patient had a PCN reaction causing immediate rash, facial/tongue/throat swelling, SOB or lightheadedness with hypotension: No Has patient had a PCN reaction causing severe rash involving mucus membranes or skin  necrosis: No Has patient had a PCN reaction that required hospitalization No Has patient had a PCN reaction occurring within the last 10 years: No If all of the above answers are "NO", then may proceed with Cephalosporin use.   . Dilaudid  [Hydromorphone Hcl] Rash  . Etodolac Rash  . Hydrochlorothiazide Rash  . Hydrocodone Rash  . Hydromorphone Rash  . Sulfa Antibiotics Rash    Current Outpatient Medications  Medication Sig Dispense Refill  . atorvastatin (LIPITOR) 10 MG tablet Take 10 mg by mouth daily at 6 PM.     . Cholecalciferol (VITAMIN D) 2000 units CAPS Take 2,000 Units by mouth daily.     [name] glipiZIDE (GLUCOTROL XL) 5 MG 24 hr tablet TAKE 1 TABLETS BY MOUTH DAILY 90 tablet 3  . glucose blood (ONETOUCH VERIO) test strip Use as instructed; Check blood sugar twice daily (Patient taking differently: Use as instructed; Check blood sugar twice daily) 100 each 12  . [name] 50-1000 MG tablet TAKE 1 TABLET BY MOUTH TWICE DAILY WITH A MEAL 180 tablet 3  . lisinopril-hydrochlorothiazide (PRINZIDE,ZESTORETIC) 20-12.5 MG tablet TAKE 2 TABLETS BY MOUTH EVERY MORNING 60 tablet 12  . metoprolol succinate (TOPROL-XL) 100 MG 24 hr tablet Take 1 tablet (100 mg total) by mouth daily. Take with or immediately following a meal. 90 tablet 3  . pioglitazone (ACTOS) 45 MG tablet TAKE 1 TABLET(45 MG) BY MOUTH DAILY 30 tablet 11  . tamoxifen (NOLVADEX) 20 MG tablet TAKE 1 TABLET(20 MG) BY MOUTH DAILY 30 tablet 0  . acetaminophen (TYLENOL) 500 MG tablet Take 500 mg by mouth every 6 (six) hours as needed for mild pain or moderate pain.    . cyclobenzaprine (FLEXERIL) 10 MG tablet Take 1 tablet (10 mg total) by mouth every 8 (eight) hours as needed for muscle spasms. (Patient not taking: Reported on 09/19/2017) 60 tablet 3  . meloxicam (MOBIC) 15 MG tablet Take 15 mg by mouth daily.      No current facility-administered medications for this visit.     OBJECTIVE: Vitals:   09/19/17 1147  BP: (!) 180/85    Pulse: (!) 59  Resp: 20  Temp: (!) 97.5 F (36.4 C)     Body mass index is 44.11 kg/m.    ECOG FS:0 - Asymptomatic  General: Well-developed, well-nourished, no acute distress. Eyes: Pink conjunctiva, anicteric sclera. Breast: Patient declined breast exam today. Lungs: Clear to auscultation bilaterally. Heart: Regular rate and rhythm. No rubs, murmurs, or gallops. Abdomen: Soft, nontender, nondistended. No organomegaly noted, normoactive bowel sounds. Musculoskeletal: No edema, cyanosis, or clubbing. Neuro: Alert, answering all questions appropriately. Cranial nerves grossly intact. Skin: No rashes or petechiae noted. Psych: Normal affect.  LAB RESULTS:  Lab Results  Component Value Date   NA 141 09/18/2017   K 4.4 09/18/2017   CL 105 09/18/2017   CO2 20 09/18/2017   GLUCOSE 140 (H) 09/18/2017   BUN 27 09/18/2017   CREATININE 1.36 (H) 09/18/2017   CALCIUM 9.0 09/18/2017   PROT 6.6 09/18/2017   ALBUMIN 3.9 09/18/2017   AST  17 09/18/2017   ALT 8 09/18/2017   ALKPHOS 43 09/18/2017   BILITOT 0.4 09/18/2017   GFRNONAA 39 (L) 09/18/2017   GFRAA 45 (L) 09/18/2017    Lab Results  Component Value Date   WBC 6.7 09/18/2017   NEUTROABS 4.1 09/18/2017   HGB 11.3 09/18/2017   HCT 36.8 09/18/2017   MCV 91 09/18/2017   PLT 225 09/18/2017     STUDIES: No results found.  ASSESSMENT: Right breast high grade DCIS.  PLAN:    1. Right breast high grade DCIS: Final pathology was reviewed independently as also discussed at breast case conference. Patient has close margins less than 0.5 mm. Patient was given the option for reexcision to obtain better margins or proceed directly with XRT. She declined reexcision and subsequently underwent adjuvant XRT.  Continue tamoxifen for a total of 5 years completing in May 2023.  Patient's most recent mammogram on June 14, 2017 was reported as BI-RADS 2.  Repeat in February 2020.  Return to clinic in 6 months for routine  evaluation.  Approximately 20 minutes spent in discussion of which greater than 50% was consultation.    Patient expressed understanding and was in agreement with this plan. She also understands that She can call clinic at any time with any questions, concerns, or complaints.   Cancer Staging Ductal carcinoma in situ (DCIS) of right breast Staging form: Breast, AJCC 8th Edition - Pathologic stage from 08/02/2016: Stage 0 (pTis (DCIS), pN0, cM0, ER: Positive, PR: Positive, HER2: Negative) - Signed by Lloyd Huger, MD on 08/02/2016   Lloyd Huger, MD   09/22/2017 11:30 AM

## 2017-09-18 ENCOUNTER — Ambulatory Visit (INDEPENDENT_AMBULATORY_CARE_PROVIDER_SITE_OTHER): Payer: Medicare Other | Admitting: Family Medicine

## 2017-09-18 ENCOUNTER — Encounter: Payer: Self-pay | Admitting: Family Medicine

## 2017-09-18 ENCOUNTER — Ambulatory Visit (INDEPENDENT_AMBULATORY_CARE_PROVIDER_SITE_OTHER): Payer: Medicare Other

## 2017-09-18 VITALS — BP 142/68 | HR 63 | Temp 98.4°F | Ht 63.0 in | Wt 248.6 lb

## 2017-09-18 DIAGNOSIS — Z0000 Encounter for general adult medical examination without abnormal findings: Secondary | ICD-10-CM | POA: Diagnosis not present

## 2017-09-18 DIAGNOSIS — M545 Low back pain: Secondary | ICD-10-CM

## 2017-09-18 DIAGNOSIS — N183 Chronic kidney disease, stage 3 unspecified: Secondary | ICD-10-CM

## 2017-09-18 DIAGNOSIS — E7800 Pure hypercholesterolemia, unspecified: Secondary | ICD-10-CM | POA: Diagnosis not present

## 2017-09-18 DIAGNOSIS — I10 Essential (primary) hypertension: Secondary | ICD-10-CM

## 2017-09-18 DIAGNOSIS — E119 Type 2 diabetes mellitus without complications: Secondary | ICD-10-CM | POA: Diagnosis not present

## 2017-09-18 MED ORDER — CYCLOBENZAPRINE HCL 10 MG PO TABS
10.0000 mg | ORAL_TABLET | Freq: Three times a day (TID) | ORAL | 3 refills | Status: DC | PRN
Start: 2017-09-18 — End: 2017-12-19

## 2017-09-18 NOTE — Progress Notes (Signed)
Subjective:   Carol Ramsey is a 72 y.o. female who presents for Medicare Annual (Subsequent) preventive examination.  Review of Systems:  N/A  Cardiac Risk Factors include: advanced age (>41men, >61 women);diabetes mellitus;dyslipidemia;hypertension;obesity (BMI >30kg/m2)     Objective:     Vitals: BP (!) 142/68 (BP Location: Right Arm)   Pulse 63   Temp 98.4 F (36.9 C) (Oral)   Ht 5\' 3"  (1.6 m)   Wt 248 lb 9.6 oz (112.8 kg)   BMI 44.04 kg/m   Body mass index is 44.04 kg/m.  Advanced Directives 09/18/2017 05/10/2017 02/14/2017 11/03/2016 08/01/2016 07/28/2016 07/05/2016  Does Patient Have a Medical Advance Directive? No No No No No No No  Would patient like information on creating a medical advance directive? No - Patient declined No - Patient declined - No - Patient declined No - Patient declined No - Patient declined -    Tobacco Social History   Tobacco Use  Smoking Status Never Smoker  Smokeless Tobacco Never Used     Counseling given: Not Answered   Clinical Intake:  Pre-visit preparation completed: Yes  Pain : No/denies pain Pain Score: 0-No pain(Pt states she only has back pain when she up and moving around.)     Nutritional Status: BMI > 30  Obese Nutritional Risks: None Diabetes: Yes(type 2) CBG done?: No Did pt. bring in CBG monitor from home?: No  How often do you need to have someone help you when you read instructions, pamphlets, or other written materials from your doctor or pharmacy?: 1 - Never  Interpreter Needed?: No  Information entered by :: [facility], LPN  Past Medical History:  Diagnosis Date  . Anemia   . Arthritis   . 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 Diabetes mellitus without complication ([place])   . Hyperlipidemia   . Hypertension   . Hyperthyroidism   . Obesity   . Personal history of radiation therapy   . Thyroid disease    hyperthyroidism  . Vitamin D  deficiency    Past Surgical History:  Procedure Laterality Date  . BREAST BIOPSY Right 06/28/2016   stereotactic biopsy - DUCTAL CARCINOMA IN SITU (DCIS), HIGH NUCLEAR GRADE  (very narrow margins)  . BREAST LUMPECTOMY Right 07/18/2016  . JOINT REPLACEMENT Left    tkr  . KNEE SURGERY Left    arthroscopy  . MASTECTOMY, PARTIAL Right 07/18/2016   Procedure: MASTECTOMY PARTIAL;  Surgeon: Robert Bellow, MD;  Location: [facility] ORS;  Service: General;  Laterality: Right;  . SKIN CANCER EXCISION     face   Family History  Problem Relation Age of Onset  . Hypertension Mother   . Cervical cancer Mother   . Heart disease Father   . Hypertension Father   . Lung cancer Father   . Lung cancer Sister   . Brain cancer Sister   . Diabetes Brother   . Heart disease Brother   . Hypertension Brother   . Stroke Brother   . Breast cancer Neg Hx    Social History   Socioeconomic History  . Marital status: Married    Spouse name: Not on file  . Number of children: 1  . Years of education: Not on file  . Highest education level: Bachelor's degree (e.g., BA, AB, BS)  Occupational History  . Occupation: retired  Scientific laboratory technician  . Financial resource strain: Not  hard at all  . Food insecurity:    Worry: Never true    Inability: Never true  . Transportation needs:    Medical: No    Non-medical: No  Tobacco Use  . Smoking status: Never Smoker  . Smokeless tobacco: Never Used  Substance and Sexual Activity  . Alcohol use: No  . Drug use: No  . Sexual activity: Never  Lifestyle  . Physical activity:    Days per week: Not on file    Minutes per session: Not on file  . Stress: Not at all  Relationships  . Social connections:    Talks on phone: Not on file    Gets together: Not on file    Attends religious service: Not on file    Active member of club or organization: Not on file    Attends meetings of clubs or organizations: Not on file    Relationship status: Not on file  Other  Topics Concern  . Not on file  Social History Narrative  . Not on file    Outpatient Encounter Medications as of 09/18/2017  Medication Sig  . acetaminophen (TYLENOL) 500 MG tablet Take 500 mg by mouth every 6 (six) hours as needed for mild pain or moderate pain.  [name] atorvastatin (LIPITOR) 10 MG tablet Take 10 mg by mouth daily at 6 PM.   . Cholecalciferol (VITAMIN D) 2000 units CAPS Take 2,000 Units by mouth daily.   [name] glipiZIDE (GLUCOTROL XL) 5 MG 24 hr tablet TAKE 1 TABLETS BY MOUTH DAILY  . glucose blood (ONETOUCH VERIO) test strip Use as instructed; Check blood sugar twice daily (Patient taking differently: Use as instructed; Check blood sugar twice daily)  . [name] 50-1000 MG tablet TAKE 1 TABLET BY MOUTH TWICE DAILY WITH A MEAL  . lisinopril-hydrochlorothiazide (PRINZIDE,ZESTORETIC) 20-12.5 MG tablet TAKE 2 TABLETS BY MOUTH EVERY MORNING  . meloxicam (MOBIC) 15 MG tablet Take 15 mg by mouth daily.   . metoprolol succinate (TOPROL-XL) 100 MG 24 hr tablet Take 1 tablet (100 mg total) by mouth daily. Take with or immediately following a meal.  . pioglitazone (ACTOS) 45 MG tablet TAKE 1 TABLET(45 MG) BY MOUTH DAILY  . tamoxifen (NOLVADEX) 20 MG tablet TAKE 1 TABLET(20 MG) BY MOUTH DAILY  . aspirin 81 MG tablet Take 81 mg by mouth daily. Hasn't taken in 1 week  . cyclobenzaprine (FLEXERIL) 5 MG tablet Take 1 tablet (5 mg total) by mouth at bedtime. (Patient not taking: Reported on 09/18/2017)   No facility-administered encounter medications on file as of 09/18/2017.     Activities of Daily Living In your present state of health, do you have any difficulty performing the following activities: 09/18/2017  Hearing? N  Vision? N  Difficulty concentrating or making decisions? N  Walking or climbing stairs? Y  Comment Due to back pain.   Dressing or bathing? N  Doing errands, shopping? Y  Comment Currently not driving, still has license.   Preparing Food and eating ? N  Using the Toilet?  N  In the past six months, have you accidently leaked urine? N  Do you have problems with loss of bowel control? N  Managing your Medications? N  Managing your Finances? N  Housekeeping or managing your Housekeeping? N  Some recent data might be hidden    Patient Care Team: Jerrol Banana., MD as PCP - General (Family Medicine) Marry Guan, Laurice Record, MD as Consulting Physician (Orthopedic Surgery) Dasher, Rayvon Char, MD  as Consulting Physician (Dermatology) Corey Skains, MD as Consulting Physician (Cardiology) Thelma Comp, OD as Consulting Physician (Optometry) Byrnett, Forest Gleason, MD (General Surgery) Noreene Filbert, MD as Referring Physician (Radiation Oncology) Lloyd Huger, MD as Consulting Physician (Oncology)    Assessment:   This is a routine wellness examination for Carol Ramsey.  Exercise Activities and Dietary recommendations Current Exercise Habits: The patient does not participate in regular exercise at present, Exercise limited by: orthopedic condition(s)(back pain)  Goals    . Exercise 3x per week (30 min per time)     Recommend to try the "sit down exercises" for 3 days a week for at least 20-30 minutes.        Fall Risk Fall Risk  09/18/2017 11/03/2016 05/11/2016 03/03/2016 02/10/2015  Falls in the past year? No No No No No   Is the patient's home free of loose throw rugs in walkways, pet beds, electrical cords, etc?   yes      Grab bars in the bathroom? yes      Handrails on the stairs?   yes      Adequate lighting?   yes  Will send a community referral for a walk in shower and ramps where stair are at home.   Timed Get Up and Go performed: N/A  Depression Screen PHQ 2/9 Scores 09/18/2017 11/03/2016 05/11/2016 03/03/2016  PHQ - 2 Score 0 0 0 0     Cognitive Function: Pt declined screening today.      6CIT Screen 05/11/2016  What Year? 0 points  What month? 0 points  What time? 0 points  Count back from 20 0 points  Months in reverse 0 points    Repeat phrase 0 points  Total Score 0    Immunization History  Administered Date(s) Administered  . Influenza Split 02/25/2006, 03/07/2011, 02/27/2012  . Influenza, High Dose Seasonal PF 03/03/2016, 01/26/2017  . Influenza,inj,Quad PF,6+ Mos 03/04/2013  . Pneumococcal Conjugate-13 04/13/2015  . Pneumococcal Polysaccharide-23 02/17/2007, 02/27/2012  . Td 10/21/2003  . Tdap 05/11/2016  . Zoster 11/08/2010    Qualifies for Shingles Vaccine? Due for Shingles vaccine. Declined my offer to administer today. Education has been provided regarding the importance of this vaccine. Pt has been advised to call her insurance company to determine her out of pocket expense. Advised she may also receive this vaccine at her local pharmacy or Health Dept. Verbalized acceptance and understanding.  Screening Tests Health Maintenance  Topic Date Due  . OPHTHALMOLOGY EXAM  10/17/2017  . HEMOGLOBIN A1C  11/19/2017  . INFLUENZA VACCINE  11/30/2017  . FOOT EXAM  01/26/2018  . MAMMOGRAM  06/15/2019  . COLONOSCOPY  01/18/2022  . TETANUS/TDAP  05/11/2026  . DEXA SCAN  Completed  . Hepatitis C Screening  Completed  . PNA vac Low Risk Adult  Completed    Cancer Screenings: Lung: Low Dose CT Chest recommended if Age 4-80 years, 30 pack-year currently smoking OR have quit w/in 15years. Patient does not qualify. Breast:  Up to date on Mammogram? Yes   Up to date of Bone Density/Dexa? Yes Colorectal: Up to date  Additional Screenings:  Hepatitis C Screening: Up to date     Plan:  I have personally reviewed and addressed the Medicare Annual Wellness questionnaire and have noted the following in the patient's chart:  A. Medical and social history B. Use of alcohol, tobacco or illicit drugs  C. Current medications and supplements D. Functional ability and status E.  Nutritional status F.  Physical activity G. Advance directives H. List of other physicians I.  Hospitalizations, surgeries, and ER  visits in previous 12 months J.  [place] such as hearing and vision if needed, cognitive and depression L. Referrals and appointments - none  In addition, I have reviewed and discussed with patient certain preventive protocols, quality metrics, and best practice recommendations. A written personalized care plan for preventive services as well as general preventive health recommendations were provided to patient.  See attached scanned questionnaire for additional information.   Signed,  Fabio Neighbors, LPN Nurse Health Advisor   Nurse Recommendations: None.

## 2017-09-18 NOTE — Progress Notes (Signed)
Patient: Carol Ramsey Female    DOB: 09-21-1945   72 y.o.   MRN: [id number] Visit Date: 09/18/2017  Today's Provider: [name], MD   Chief Complaint  Patient presents with  . Diabetes  . Hypertension   Subjective:    HPI  Mammogram- 06/14/17 normal Colonoscopy- 01/09/2012 normal repeat 10 years Pap- 02/10/15 normal BMD-04/22/08   Diabetes Mellitus Type II, Follow-up:   Lab Results  Component Value Date   HGBA1C 7.2 05/22/2017   HGBA1C 6.4 01/26/2017   HGBA1C 6.4 10/24/2016    Last seen for diabetes 4 months ago.  Management since then includes none. She reports good compliance with treatment. She is not having side effects.  Home blood sugar records: 120's  Episodes of hypoglycemia? no   Current Insulin Regimen: n/a Most Recent Eye Exam: 10/17/16 Current exercise: none  Pertinent Labs:    Component Value Date/Time   CHOL 107 01/26/2017 1222   CHOL 115 10/05/2015 1059   TRIG 81 01/26/2017 1222   HDL 48 (L) 01/26/2017 1222   HDL 55 10/05/2015 1059   LDLCALC 43 01/26/2017 1222   CREATININE 1.16 (H) 01/26/2017 1222    Wt Readings from Last 3 Encounters:  09/18/17 248 lb 9.6 oz (112.8 kg)  09/18/17 248 lb 9.6 oz (112.8 kg)  05/22/17 252 lb (114.3 kg)    ------------------------------------------------------------------------   Hypertension, follow-up:  BP Readings from Last 3 Encounters:  09/18/17 (!) 142/68  09/18/17 (!) 142/68  06/15/17 126/64    She was last seen for hypertension 4 months ago.  BP at that visit was . Management since that visit includes none. She reports good compliance with treatment. She is not having side effects.  She is not exercising. She is adherent to low salt diet.   Outside blood pressures are not being checked. Patient denies chest pain, chest pressure/discomfort, claudication, dyspnea, exertional chest pressure/discomfort, fatigue, irregular heart beat, lower extremity edema, near-syncope,  orthopnea, palpitations, paroxysmal nocturnal dyspnea, syncope and tachypnea.    Wt Readings from Last 3 Encounters:  09/18/17 248 lb 9.6 oz (112.8 kg)  09/18/17 248 lb 9.6 oz (112.8 kg)  05/22/17 252 lb (114.3 kg)   ------------------------------------------------------------------------ Her main complaint is chronic back and knee pain.    Allergies  Allergen Reactions  . Gabapentin Swelling  . Quinine Nausea Only and Nausea And Vomiting  . Amoxicillin Itching and Rash    Has patient had a PCN reaction causing immediate rash, facial/tongue/throat swelling, SOB or lightheadedness with hypotension: No Has patient had a PCN reaction causing severe rash involving mucus membranes or skin necrosis: No Has patient had a PCN reaction that required hospitalization No Has patient had a PCN reaction occurring within the last 10 years: No If all of the above answers are "NO", then may proceed with Cephalosporin use.   . Dilaudid  [Hydromorphone Hcl] Rash  . Etodolac Rash  . Hydrochlorothiazide Rash  . Hydrocodone Rash  . Hydromorphone Rash  . Sulfa Antibiotics Rash     Current Outpatient Medications:  .  acetaminophen (TYLENOL) 500 MG tablet, Take 500 mg by mouth every 6 (six) hours as needed for mild pain or moderate pain., Disp: , Rfl:  .  aspirin 81 MG tablet, Take 81 mg by mouth daily. Hasn't taken in 1 week, Disp: , Rfl:  .  atorvastatin (LIPITOR) 10 MG tablet, Take 10 mg by mouth daily at 6 PM. , Disp: , Rfl:  .  Cholecalciferol (VITAMIN  D) 2000 units CAPS, Take 2,000 Units by mouth daily. , Disp: , Rfl:  .  glipiZIDE (GLUCOTROL XL) 5 MG 24 hr tablet, TAKE 1 TABLETS BY MOUTH DAILY, Disp: 90 tablet, Rfl: 3 .  glucose blood (ONETOUCH VERIO) test strip, Use as instructed; Check blood sugar twice daily (Patient taking differently: Use as instructed; Check blood sugar twice daily), Disp: 100 each, Rfl: 12 .  [name] 50-1000 MG tablet, TAKE 1 TABLET BY MOUTH TWICE DAILY WITH A MEAL, Disp:  180 tablet, Rfl: 3 .  lisinopril-hydrochlorothiazide (PRINZIDE,ZESTORETIC) 20-12.5 MG tablet, TAKE 2 TABLETS BY MOUTH EVERY MORNING, Disp: 60 tablet, Rfl: 12 .  meloxicam (MOBIC) 15 MG tablet, Take 15 mg by mouth daily. , Disp: , Rfl:  .  metoprolol succinate (TOPROL-XL) 100 MG 24 hr tablet, Take 1 tablet (100 mg total) by mouth daily. Take with or immediately following a meal., Disp: 90 tablet, Rfl: 3 .  pioglitazone (ACTOS) 45 MG tablet, TAKE 1 TABLET(45 MG) BY MOUTH DAILY, Disp: 30 tablet, Rfl: 11 .  tamoxifen (NOLVADEX) 20 MG tablet, TAKE 1 TABLET(20 MG) BY MOUTH DAILY, Disp: 30 tablet, Rfl: 0 .  cyclobenzaprine (FLEXERIL) 5 MG tablet, Take 1 tablet (5 mg total) by mouth at bedtime. (Patient not taking: Reported on 09/18/2017), Disp: 30 tablet, Rfl: 1  Review of Systems  Constitutional: Negative.   HENT: Negative.   Eyes: Negative.   Respiratory: Negative.   Cardiovascular: Positive for leg swelling.  Gastrointestinal: Negative.   Endocrine: Positive for cold intolerance.  Genitourinary: Negative.   Musculoskeletal: Positive for arthralgias and back pain.  Skin: Negative.   Allergic/Immunologic: Positive for environmental allergies.  Neurological: Negative.   Hematological: Negative.   Psychiatric/Behavioral: Negative.     Social History   Tobacco Use  . Smoking status: Never Smoker  . Smokeless tobacco: Never Used  Substance Use Topics  . Alcohol use: No   Objective:   BP (!) 142/68   Pulse 63   Temp 98.4 F (36.9 C) (Oral)   Ht 5\' 3"  (1.6 m)   Wt 248 lb 9.6 oz (112.8 kg)   BMI 44.04 kg/m  Vitals:   09/18/17 1031  BP: (!) 142/68  Pulse: 63  Temp: 98.4 F (36.9 C)  TempSrc: Oral  Weight: 248 lb 9.6 oz (112.8 kg)  Height: 5\' 3"  (1.6 m)     Physical Exam  Constitutional: She is oriented to person, place, and time. She appears well-developed and well-nourished.  HENT:  Head: Normocephalic and atraumatic.  Right Ear: External ear normal.  Left Ear: External  ear normal.  Nose: Nose normal.  Eyes: Conjunctivae are normal. No scleral icterus.  Neck: No thyromegaly present.  Cardiovascular: Normal rate, regular rhythm and normal heart sounds.  Pulmonary/Chest: Effort normal and breath sounds normal.  Abdominal: Soft.  Musculoskeletal: She exhibits no edema.  Neurological: She is alert and oriented to person, place, and time.  Skin: Skin is warm and dry.  Chronic lymphedema.  Psychiatric: She has a normal mood and affect. Her behavior is normal. Judgment and thought content normal.        Assessment & Plan:     1. Acute on chronic low back pain, unspecified back pain laterality, with sciatica presence unspecified  - cyclobenzaprine (FLEXERIL) 10 MG tablet; Take 1 tablet (10 mg total) by mouth every 8 (eight) hours as needed for muscle spasms. (Patient not taking: Reported on 09/19/2017)  Dispense: 60 tablet; Refill: 3  2. Type 2 diabetes mellitus without complication, without  long-term current use of insulin ([name]) RTC 3 months. - Hemoglobin A1c  3. Essential (primary) hypertension  - CBC with Differential/Platelet - TSH  4. Pure hypercholesterolemia  - Lipid panel - Comprehensive metabolic panel 5.Morbid Obesity BMD WNL. 5.Chronic lymphedema 6.CKD     I have done the exam and reviewed the above chart and it is accurate to the best of my knowledge. Development worker, community has been used in this note in any air is in the dictation or transcription are unintentional.  [name], MD  [facility]

## 2017-09-18 NOTE — Patient Instructions (Addendum)
[name] , Thank you for taking time to come for your Medicare Wellness Visit. I appreciate your ongoing commitment to your health goals. Please review the following plan we discussed and let me know if I can assist you in the future.   Screening recommendations/referrals: Colonoscopy: Up to date Mammogram: Up to date Bone Density: Up to date Recommended yearly ophthalmology/optometry visit for glaucoma screening and checkup Recommended yearly dental visit for hygiene and checkup  Vaccinations: Influenza vaccine: Up to date Pneumococcal vaccine: Up to date Tdap vaccine: Up to date Shingles vaccine: Pt declines today.     Advanced directives: Advance directive discussed with you today. Even though you declined this today please call our office should you change your mind and we can give you the proper paperwork for you to fill out.  Conditions/risks identified: Obesity- recommend to try the "sit down exercises" for 3 days a week for at least 20-30 minutes.   Next appointment: 10:40 AM today with Dr Carol Ramsey   Preventive Care 10 Years and Older, Female Preventive care refers to lifestyle choices and visits with your health care provider that can promote health and wellness. What does preventive care include?  A yearly physical exam. This is also called an annual well check.  Dental exams once or twice a year.  Routine eye exams. Ask your health care provider how often you should have your eyes checked.  Personal lifestyle choices, including:  Daily care of your teeth and gums.  Regular physical activity.  Eating a healthy diet.  Avoiding tobacco and drug use.  Limiting alcohol use.  Practicing safe sex.  Taking low-dose aspirin every day.  Taking vitamin and mineral supplements as recommended by your health care provider. What happens during an annual well check? The services and screenings done by your health care provider during your annual well check will depend  on your age, overall health, lifestyle risk factors, and family history of disease. Counseling  Your health care provider may ask you questions about your:  Alcohol use.  Tobacco use.  Drug use.  Emotional well-being.  Home and relationship well-being.  Sexual activity.  Eating habits.  History of falls.  Memory and ability to understand (cognition).  Work and work Statistician.  Reproductive health. Screening  You may have the following tests or measurements:  Height, weight, and BMI.  Blood pressure.  Lipid and cholesterol levels. These may be checked every 5 years, or more frequently if you are over 66 years old.  Skin check.  Lung cancer screening. You may have this screening every year starting at age 69 if you have a 30-pack-year history of smoking and currently smoke or have quit within the past 15 years.  Fecal occult blood test (FOBT) of the stool. You may have this test every year starting at age 66.  Flexible sigmoidoscopy or colonoscopy. You may have a sigmoidoscopy every 5 years or a colonoscopy every 10 years starting at age 35.  Hepatitis C blood test.  Hepatitis B blood test.  Sexually transmitted disease (STD) testing.  Diabetes screening. This is done by checking your blood sugar (glucose) after you have not eaten for a while (fasting). You may have this done every 1-3 years.  Bone density scan. This is done to screen for osteoporosis. You may have this done starting at age 71.  Mammogram. This may be done every 1-2 years. Talk to your health care provider about how often you should have regular mammograms. Talk with your health  care provider about your test results, treatment options, and if necessary, the need for more tests. Vaccines  Your health care provider may recommend certain vaccines, such as:  Influenza vaccine. This is recommended every year.  Tetanus, diphtheria, and acellular pertussis (Tdap, Td) vaccine. You may need a Td  booster every 10 years.  Zoster vaccine. You may need this after age 54.  Pneumococcal 13-valent conjugate (PCV13) vaccine. One dose is recommended after age 67.  Pneumococcal polysaccharide (PPSV23) vaccine. One dose is recommended after age 31. Talk to your health care provider about which screenings and vaccines you need and how often you need them. This information is not intended to replace advice given to you by your health care provider. Make sure you discuss any questions you have with your health care provider. Document Released: 05/15/2015 Document Revised: 01/06/2016 Document Reviewed: 02/17/2015 Elsevier Interactive Patient Education  2017 [place] Prevention in the Home Falls can cause injuries. They can happen to people of all ages. There are many things you can do to make your home safe and to help prevent falls. What can I do on the outside of my home?  Regularly fix the edges of walkways and driveways and fix any cracks.  Remove anything that might make you trip as you walk through a door, such as a raised step or threshold.  Trim any bushes or trees on the path to your home.  Use bright outdoor lighting.  Clear any walking paths of anything that might make someone trip, such as rocks or tools.  Regularly check to see if handrails are loose or broken. Make sure that both sides of any steps have handrails.  Any raised decks and porches should have guardrails on the edges.  Have any leaves, snow, or ice cleared regularly.  Use sand or salt on walking paths during winter.  Clean up any spills in your garage right away. This includes oil or grease spills. What can I do in the bathroom?  Use night lights.  Install grab bars by the toilet and in the tub and shower. Do not use towel bars as grab bars.  Use non-skid mats or decals in the tub or shower.  If you need to sit down in the shower, use a plastic, non-slip stool.  Keep the floor dry. Clean up  any water that spills on the floor as soon as it happens.  Remove soap buildup in the tub or shower regularly.  Attach bath mats securely with double-sided non-slip rug tape.  Do not have throw rugs and other things on the floor that can make you trip. What can I do in the bedroom?  Use night lights.  Make sure that you have a light by your bed that is easy to reach.  Do not use any sheets or blankets that are too big for your bed. They should not hang down onto the floor.  Have a firm chair that has side arms. You can use this for support while you get dressed.  Do not have throw rugs and other things on the floor that can make you trip. What can I do in the kitchen?  Clean up any spills right away.  Avoid walking on wet floors.  Keep items that you use a lot in easy-to-reach places.  If you need to reach something above you, use a strong step stool that has a grab bar.  Keep electrical cords out of the way.  Do not use floor  polish or wax that makes floors slippery. If you must use wax, use non-skid floor wax.  Do not have throw rugs and other things on the floor that can make you trip. What can I do with my stairs?  Do not leave any items on the stairs.  Make sure that there are handrails on both sides of the stairs and use them. Fix handrails that are broken or loose. Make sure that handrails are as long as the stairways.  Check any carpeting to make sure that it is firmly attached to the stairs. Fix any carpet that is loose or worn.  Avoid having throw rugs at the top or bottom of the stairs. If you do have throw rugs, attach them to the floor with carpet tape.  Make sure that you have a light switch at the top of the stairs and the bottom of the stairs. If you do not have them, ask someone to add them for you. What else can I do to help prevent falls?  Wear shoes that:  Do not have high heels.  Have rubber bottoms.  Are comfortable and fit you well.  Are  closed at the toe. Do not wear sandals.  If you use a stepladder:  Make sure that it is fully opened. Do not climb a closed stepladder.  Make sure that both sides of the stepladder are locked into place.  Ask someone to hold it for you, if possible.  Clearly mark and make sure that you can see:  Any grab bars or handrails.  First and last steps.  Where the edge of each step is.  Use tools that help you move around (mobility aids) if they are needed. These include:  Canes.  Walkers.  Scooters.  Crutches.  Turn on the lights when you go into a dark area. Replace any light bulbs as soon as they burn out.  Set up your furniture so you have a clear path. Avoid moving your furniture around.  If any of your floors are uneven, fix them.  If there are any pets around you, be aware of where they are.  Review your medicines with your doctor. Some medicines can make you feel dizzy. This can increase your chance of falling. Ask your doctor what other things that you can do to help prevent falls. This information is not intended to replace advice given to you by your health care provider. Make sure you discuss any questions you have with your health care provider. Document Released: 02/12/2009 Document Revised: 09/24/2015 Document Reviewed: 05/23/2014 Elsevier Interactive Patient Education  2017 [employer].

## 2017-09-19 ENCOUNTER — Encounter: Payer: Self-pay | Admitting: Oncology

## 2017-09-19 ENCOUNTER — Other Ambulatory Visit: Payer: Self-pay

## 2017-09-19 ENCOUNTER — Inpatient Hospital Stay: Payer: Medicare Other | Attending: Oncology | Admitting: Oncology

## 2017-09-19 VITALS — BP 180/85 | HR 59 | Temp 97.5°F | Resp 20 | Wt 249.0 lb

## 2017-09-19 DIAGNOSIS — E669 Obesity, unspecified: Secondary | ICD-10-CM | POA: Insufficient documentation

## 2017-09-19 DIAGNOSIS — E0590 Thyrotoxicosis, unspecified without thyrotoxic crisis or storm: Secondary | ICD-10-CM | POA: Insufficient documentation

## 2017-09-19 DIAGNOSIS — Z79899 Other long term (current) drug therapy: Secondary | ICD-10-CM | POA: Diagnosis not present

## 2017-09-19 DIAGNOSIS — D0511 Intraductal carcinoma in situ of right breast: Secondary | ICD-10-CM | POA: Insufficient documentation

## 2017-09-19 DIAGNOSIS — E785 Hyperlipidemia, unspecified: Secondary | ICD-10-CM | POA: Diagnosis not present

## 2017-09-19 DIAGNOSIS — Z170 Estrogen receptor positive status [ER+]: Secondary | ICD-10-CM | POA: Diagnosis not present

## 2017-09-19 DIAGNOSIS — Z79810 Long term (current) use of selective estrogen receptor modulators (SERMs): Secondary | ICD-10-CM | POA: Diagnosis not present

## 2017-09-19 DIAGNOSIS — Z801 Family history of malignant neoplasm of trachea, bronchus and lung: Secondary | ICD-10-CM | POA: Diagnosis not present

## 2017-09-19 DIAGNOSIS — Z9011 Acquired absence of right breast and nipple: Secondary | ICD-10-CM

## 2017-09-19 DIAGNOSIS — Z923 Personal history of irradiation: Secondary | ICD-10-CM | POA: Diagnosis not present

## 2017-09-19 DIAGNOSIS — E119 Type 2 diabetes mellitus without complications: Secondary | ICD-10-CM | POA: Diagnosis not present

## 2017-09-19 DIAGNOSIS — I10 Essential (primary) hypertension: Secondary | ICD-10-CM | POA: Diagnosis not present

## 2017-09-19 DIAGNOSIS — Z808 Family history of malignant neoplasm of other organs or systems: Secondary | ICD-10-CM | POA: Diagnosis not present

## 2017-09-19 DIAGNOSIS — E559 Vitamin D deficiency, unspecified: Secondary | ICD-10-CM | POA: Diagnosis not present

## 2017-09-19 LAB — COMPREHENSIVE METABOLIC PANEL
ALBUMIN: 3.9 g/dL (ref 3.5–4.8)
ALT: 8 IU/L (ref 0–32)
AST: 17 IU/L (ref 0–40)
Albumin/Globulin Ratio: 1.4 (ref 1.2–2.2)
Alkaline Phosphatase: 43 IU/L (ref 39–117)
BUN / CREAT RATIO: 20 (ref 12–28)
BUN: 27 mg/dL (ref 8–27)
Bilirubin Total: 0.4 mg/dL (ref 0.0–1.2)
CO2: 20 mmol/L (ref 20–29)
CREATININE: 1.36 mg/dL — AB (ref 0.57–1.00)
Calcium: 9 mg/dL (ref 8.7–10.3)
Chloride: 105 mmol/L (ref 96–106)
GFR calc Af Amer: 45 mL/min/{1.73_m2} — ABNORMAL LOW (ref >59)
GFR calc non Af Amer: 39 mL/min/{1.73_m2} — ABNORMAL LOW (ref >59)
GLUCOSE: 140 mg/dL — AB (ref 65–99)
Globulin, Total: 2.7 g/dL (ref 1.5–4.5)
Potassium: 4.4 mmol/L (ref 3.5–5.2)
Sodium: 141 mmol/L (ref 134–144)
Total Protein: 6.6 g/dL (ref 6.0–8.5)

## 2017-09-19 LAB — CBC WITH DIFFERENTIAL/PLATELET
BASOS: 1 %
Basophils Absolute: 0 10*3/uL (ref 0.0–0.2)
EOS (ABSOLUTE): 0.2 10*3/uL (ref 0.0–0.4)
Eos: 3 %
Hematocrit: 36.8 % (ref 34.0–46.6)
Hemoglobin: 11.3 g/dL (ref 11.1–15.9)
IMMATURE GRANULOCYTES: 1 %
Immature Grans (Abs): 0 10*3/uL (ref 0.0–0.1)
Lymphocytes Absolute: 1.8 10*3/uL (ref 0.7–3.1)
Lymphs: 27 %
MCH: 28 pg (ref 26.6–33.0)
MCHC: 30.7 g/dL — AB (ref 31.5–35.7)
MCV: 91 fL (ref 79–97)
MONOS ABS: 0.6 10*3/uL (ref 0.1–0.9)
Monocytes: 9 %
NEUTROS PCT: 59 %
Neutrophils Absolute: 4.1 10*3/uL (ref 1.4–7.0)
PLATELETS: 225 10*3/uL (ref 150–450)
RBC: 4.03 x10E6/uL (ref 3.77–5.28)
RDW: 14.1 % (ref 12.3–15.4)
WBC: 6.7 10*3/uL (ref 3.4–10.8)

## 2017-09-19 LAB — HEMOGLOBIN A1C
Est. average glucose Bld gHb Est-mCnc: 171 mg/dL
Hgb A1c MFr Bld: 7.6 % — ABNORMAL HIGH (ref 4.8–5.6)

## 2017-09-19 LAB — TSH: TSH: 4.07 u[IU]/mL (ref 0.450–4.500)

## 2017-09-19 LAB — LIPID PANEL
Chol/HDL Ratio: 2.3 ratio (ref 0.0–4.4)
Cholesterol, Total: 114 mg/dL (ref 100–199)
HDL: 50 mg/dL (ref >39)
LDL Calculated: 41 mg/dL (ref 0–99)
Triglycerides: 114 mg/dL (ref 0–149)
VLDL Cholesterol Cal: 23 mg/dL (ref 5–40)

## 2017-09-19 NOTE — Progress Notes (Signed)
Pt stated " doing pretty good " except for on going back pain c/o

## 2017-09-20 NOTE — Progress Notes (Signed)
Advised  ED 

## 2017-10-18 ENCOUNTER — Other Ambulatory Visit: Payer: Self-pay | Admitting: Family Medicine

## 2017-10-18 DIAGNOSIS — E119 Type 2 diabetes mellitus without complications: Secondary | ICD-10-CM

## 2017-10-18 MED ORDER — GLIPIZIDE ER 5 MG PO TB24: ORAL_TABLET | ORAL | 3 refills | Status: DC

## 2017-10-18 NOTE — Telephone Encounter (Signed)
Please review. Thanks!  

## 2017-10-18 NOTE — Telephone Encounter (Signed)
Patient needs refill on Glipizide ER 5 mg. #90 called to [facility].

## 2017-11-01 ENCOUNTER — Other Ambulatory Visit: Payer: Self-pay | Admitting: *Deleted

## 2017-11-01 MED ORDER — TAMOXIFEN CITRATE 20 MG PO TABS: ORAL_TABLET | ORAL | 0 refills | Status: DC

## 2017-11-08 ENCOUNTER — Ambulatory Visit: Payer: Medicare Other | Admitting: Radiation Oncology

## 2017-11-15 ENCOUNTER — Ambulatory Visit
Admission: RE | Admit: 2017-11-15 | Discharge: 2017-11-15 | Disposition: A | Discharge status: Home / Self Care | Payer: Medicare Other | Source: Ambulatory Visit | Attending: Radiation Oncology | Admitting: Radiation Oncology

## 2017-11-15 ENCOUNTER — Encounter: Payer: Self-pay | Admitting: Radiation Oncology

## 2017-11-15 ENCOUNTER — Other Ambulatory Visit: Payer: Self-pay

## 2017-11-15 VITALS — BP 152/66 | HR 57 | Temp 96.0°F | Resp 18

## 2017-11-15 DIAGNOSIS — Z923 Personal history of irradiation: Secondary | ICD-10-CM | POA: Diagnosis not present

## 2017-11-15 DIAGNOSIS — Z79810 Long term (current) use of selective estrogen receptor modulators (SERMs): Secondary | ICD-10-CM | POA: Diagnosis not present

## 2017-11-15 DIAGNOSIS — M549 Dorsalgia, unspecified: Secondary | ICD-10-CM | POA: Diagnosis not present

## 2017-11-15 DIAGNOSIS — E669 Obesity, unspecified: Secondary | ICD-10-CM | POA: Insufficient documentation

## 2017-11-15 DIAGNOSIS — Z170 Estrogen receptor positive status [ER+]: Secondary | ICD-10-CM | POA: Insufficient documentation

## 2017-11-15 DIAGNOSIS — D0511 Intraductal carcinoma in situ of right breast: Secondary | ICD-10-CM | POA: Diagnosis not present

## 2017-11-15 NOTE — Progress Notes (Signed)
Here for follow up. Doing good per pt

## 2017-11-15 NOTE — Progress Notes (Signed)
Radiation Oncology Follow up Note  Name: Carol Ramsey   Date:   11/15/2017 MRN:  [id number] DOB: Sep 18, 1945    This 72 y.o. 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 Seen today in routine follow-up she is doing well. Major issues her back pain most likely connected to her morbid obesity..she had a mammogram back in February which I have reviewed was BI-RADS 2 benign.she is currently on tamoxifen tolerating that well without side effect.  COMPLICATIONS OF TREATMENT: none  FOLLOW UP COMPLIANCE: keeps appointments   PHYSICAL EXAM:  BP (!) 152/66 (BP Location: Right Arm, Patient Position: Sitting, Cuff Size: Normal)   Pulse (!) 57   Temp (!) 96 F (35.6 C) (Tympanic)   Resp 18  Lungs are clear to A&P cardiac examination essentially unremarkable with regular rate and rhythm. No dominant mass or nodularity is noted in either breast in 2 positions examined. Incision is well-healed. No axillary or supraclavicular adenopathy is appreciated. Cosmetic result is excellent. Well-developed well-nourished patient in NAD. HEENT reveals PERLA, EOMI, discs not visualized.  Oral cavity is clear. No oral mucosal lesions are identified. Neck is clear without evidence of cervical or supraclavicular adenopathy. Lungs are clear to A&P. Cardiac examination is essentially unremarkable with regular rate and rhythm without murmur rub or thrill. Abdomen is benign with no organomegaly or masses noted. Motor sensory and DTR levels are equal and symmetric in the upper and lower extremities. Cranial nerves II through XII are grossly intact. Proprioception is intact. No peripheral adenopathy or edema is  identified. No motor or sensory levels are noted. Crude visual fields are within normal range.  RADIOLOGY RESULTS: mammograms reviewed and compatible with the above-stated findings  PLAN: present time patient is doing well with no evidence of disease. I've asked to see her back in 1 year for follow-up. She knows to call sooner with any concerns. She is a rescheduled for follow-up mammograms. She continues on tamoxifen without side effect. Patient knows to call with any concerns at any time.  I would like to take this opportunity to thank you for allowing me to participate in the care of your patient.Noreene Filbert, MD

## 2017-11-28 DIAGNOSIS — E113293 Type 2 diabetes mellitus with mild nonproliferative diabetic retinopathy without macular edema, bilateral: Secondary | ICD-10-CM | POA: Diagnosis not present

## 2017-11-28 DIAGNOSIS — H25013 Cortical age-related cataract, bilateral: Secondary | ICD-10-CM | POA: Diagnosis not present

## 2017-11-28 DIAGNOSIS — H2513 Age-related nuclear cataract, bilateral: Secondary | ICD-10-CM | POA: Diagnosis not present

## 2017-11-28 DIAGNOSIS — H5203 Hypermetropia, bilateral: Secondary | ICD-10-CM | POA: Diagnosis not present

## 2017-11-28 DIAGNOSIS — H524 Presbyopia: Secondary | ICD-10-CM | POA: Diagnosis not present

## 2017-12-19 ENCOUNTER — Encounter: Payer: Self-pay | Admitting: Family Medicine

## 2017-12-19 ENCOUNTER — Ambulatory Visit (INDEPENDENT_AMBULATORY_CARE_PROVIDER_SITE_OTHER): Payer: Medicare Other | Admitting: Family Medicine

## 2017-12-19 VITALS — BP 136/62 | HR 65 | Temp 98.2°F | Wt 246.0 lb

## 2017-12-19 DIAGNOSIS — I10 Essential (primary) hypertension: Secondary | ICD-10-CM | POA: Diagnosis not present

## 2017-12-19 DIAGNOSIS — E119 Type 2 diabetes mellitus without complications: Secondary | ICD-10-CM | POA: Diagnosis not present

## 2017-12-19 DIAGNOSIS — M129 Arthropathy, unspecified: Secondary | ICD-10-CM | POA: Diagnosis not present

## 2017-12-19 DIAGNOSIS — Z6841 Body Mass Index (BMI) 40.0 and over, adult: Secondary | ICD-10-CM | POA: Diagnosis not present

## 2017-12-19 DIAGNOSIS — E7800 Pure hypercholesterolemia, unspecified: Secondary | ICD-10-CM

## 2017-12-19 DIAGNOSIS — R609 Edema, unspecified: Secondary | ICD-10-CM

## 2017-12-19 DIAGNOSIS — D0511 Intraductal carcinoma in situ of right breast: Secondary | ICD-10-CM

## 2017-12-19 LAB — POCT GLYCOSYLATED HEMOGLOBIN (HGB A1C): HEMOGLOBIN A1C: 7.6 % — AB (ref 4.0–5.6)

## 2017-12-19 NOTE — Progress Notes (Signed)
Patient: Carol Ramsey Female    DOB: 1945/11/14   72 y.o.   MRN: [id number] Visit Date: 12/19/2017  Today's Provider: [name], MD   Chief Complaint  Patient presents with  . Hypertension  . Hyperlipidemia  . Diabetes   Subjective:    Diabetes  She presents for her follow-up diabetic visit. She has type 2 diabetes mellitus. Her disease course has been stable. Pertinent negatives for hypoglycemia include no dizziness, headaches or sweats. Pertinent negatives for diabetes include no blurred vision, no chest pain, no fatigue, no foot paresthesias, no foot ulcerations, no polydipsia, no polyphagia, no polyuria, no visual change, no weakness and no weight loss. There are no hypoglycemic complications. Symptoms are stable. Her weight is stable. She is following a diabetic and generally healthy diet. She participates in exercise intermittently. There is no change (Pt states her fasting blood sugars run in the 90's.) in her home blood glucose trend. An ACE inhibitor/angiotensin II receptor blocker is being taken. She does not see a podiatrist.Eye exam is current.  Hypertension  This is a chronic problem. The problem is unchanged. Associated symptoms include peripheral edema. Pertinent negatives include no anxiety, blurred vision, chest pain, headaches, malaise/fatigue, neck pain, orthopnea, palpitations, PND, shortness of breath or sweats. There are no associated agents to hypertension. There are no compliance problems.   Hyperlipidemia  This is a chronic problem. The problem is controlled. Pertinent negatives include no chest pain, myalgias or shortness of breath. Current antihyperlipidemic treatment includes statins. There are no compliance problems.    BP Readings from Last 3 Encounters:  12/19/17 136/62  11/15/17 (!) 152/66  09/19/17 (!) 180/85   Lab Results  Component Value Date   CHOL 114 09/18/2017   CHOL 107 01/26/2017   CHOL 115 10/05/2015   Lab Results  Component  Value Date   HDL 50 09/18/2017   HDL 48 (L) 01/26/2017   HDL 55 10/05/2015   Lab Results  Component Value Date   LDLCALC 41 09/18/2017   LDLCALC 43 01/26/2017   LDLCALC 38 10/05/2015   Lab Results  Component Value Date   TRIG 114 09/18/2017   TRIG 81 01/26/2017   TRIG 109 10/05/2015   Lab Results  Component Value Date   CHOLHDL 2.3 09/18/2017   CHOLHDL 2.2 01/26/2017   No results found for: LDLDIRECT Lab Results  Component Value Date   HGBA1C 7.6 (H) 09/18/2017   Wt Readings from Last 3 Encounters:  12/19/17 246 lb (111.6 kg)  09/19/17 249 lb (112.9 kg)  09/18/17 248 lb 9.6 oz (112.8 kg)        Allergies  Allergen Reactions  . Gabapentin Swelling  . Quinine Nausea Only and Nausea And Vomiting  . Amoxicillin Itching and Rash    Has patient had a PCN reaction causing immediate rash, facial/tongue/throat swelling, SOB or lightheadedness with hypotension: No Has patient had a PCN reaction causing severe rash involving mucus membranes or skin necrosis: No Has patient had a PCN reaction that required hospitalization No Has patient had a PCN reaction occurring within the last 10 years: No If all of the above answers are "NO", then may proceed with Cephalosporin use.   . Dilaudid  [Hydromorphone Hcl] Rash  . Etodolac Rash  . Hydrochlorothiazide Rash  . Hydrocodone Rash  . Hydromorphone Rash  . Sulfa Antibiotics Rash     Current Outpatient Medications:  .  acetaminophen (TYLENOL) 500 MG tablet, Take 500 mg by mouth  every 6 (six) hours as needed for mild pain or moderate pain., Disp: , Rfl:  .  atorvastatin (LIPITOR) 10 MG tablet, Take 10 mg by mouth daily at 6 PM. , Disp: , Rfl:  .  Cholecalciferol (VITAMIN D) 2000 units CAPS, Take 2,000 Units by mouth daily. , Disp: , Rfl:  .  cyclobenzaprine (FLEXERIL) 10 MG tablet, Take 1 tablet (10 mg total) by mouth every 8 (eight) hours as needed for muscle spasms., Disp: 60 tablet, Rfl: 3 .  glipiZIDE (GLUCOTROL XL) 5 MG 24  hr tablet, TAKE 1 TABLETS BY MOUTH DAILY, Disp: 90 tablet, Rfl: 3 .  glucose blood (ONETOUCH VERIO) test strip, Use as instructed; Check blood sugar twice daily (Patient taking differently: Use as instructed; Check blood sugar twice daily), Disp: 100 each, Rfl: 12 .  [name] 50-1000 MG tablet, TAKE 1 TABLET BY MOUTH TWICE DAILY WITH A MEAL, Disp: 180 tablet, Rfl: 3 .  lisinopril-hydrochlorothiazide (PRINZIDE,ZESTORETIC) 20-12.5 MG tablet, TAKE 2 TABLETS BY MOUTH EVERY MORNING, Disp: 60 tablet, Rfl: 12 .  metoprolol succinate (TOPROL-XL) 100 MG 24 hr tablet, Take 1 tablet (100 mg total) by mouth daily. Take with or immediately following a meal., Disp: 90 tablet, Rfl: 3 .  pioglitazone (ACTOS) 45 MG tablet, TAKE 1 TABLET(45 MG) BY MOUTH DAILY, Disp: 30 tablet, Rfl: 11 .  tamoxifen (NOLVADEX) 20 MG tablet, TAKE 1 TABLET(20 MG) BY MOUTH DAILY, Disp: 90 tablet, Rfl: 0  Review of Systems  Constitutional: Negative.  Negative for fatigue, malaise/fatigue and weight loss.  Eyes: Negative for blurred vision.  Respiratory: Negative.  Negative for shortness of breath.   Cardiovascular: Negative.  Negative for chest pain, palpitations, orthopnea and PND.  Gastrointestinal: Negative.   Endocrine: Positive for cold intolerance. Negative for heat intolerance, polydipsia, polyphagia and polyuria.  Genitourinary: Negative.   Musculoskeletal: Positive for arthralgias (Chronic issue), back pain and gait problem. Negative for joint swelling, myalgias, neck pain and neck stiffness.       Pt complains bitterly of chronic low back pain.  Allergic/Immunologic: Negative.   Neurological: Negative for dizziness, weakness, light-headedness, numbness and headaches.  Psychiatric/Behavioral: Negative.     Social History   Tobacco Use  . Smoking status: Never Smoker  . Smokeless tobacco: Never Used  Substance Use Topics  . Alcohol use: No   Objective:   BP 136/62 (BP Location: Right Arm, Patient Position: Sitting,  Cuff Size: Large)   Pulse 65   Temp 98.2 F (36.8 C) (Oral)   Wt 246 lb (111.6 kg)   SpO2 95%   BMI 43.58 kg/m  Vitals:   12/19/17 0957  BP: 136/62  Pulse: 65  Temp: 98.2 F (36.8 C)  TempSrc: Oral  SpO2: 95%  Weight: 246 lb (111.6 kg)     Physical Exam  Constitutional: She is oriented to person, place, and time. She appears well-developed and well-nourished.  Obese WF NAD.  HENT:  Head: Normocephalic and atraumatic.  Right Ear: External ear normal.  Left Ear: External ear normal.  Nose: Nose normal.  Eyes: Conjunctivae are normal. No scleral icterus.  Neck: No thyromegaly present.  Cardiovascular: Normal rate, regular rhythm and normal heart sounds.  Pulmonary/Chest: Effort normal and breath sounds normal.  Abdominal: Soft.  Musculoskeletal: She exhibits no edema.  Neurological: She is alert and oriented to person, place, and time.  Skin: Skin is warm and dry.  Psychiatric: She has a normal mood and affect. Her behavior is normal. Judgment and thought content normal.  Assessment & Plan:     1. Essential (primary) hypertension   2. Type 2 diabetes mellitus without complication, without long-term current use of insulin ([name]) A1C stable at 7.6%   - POCT glycosylated hemoglobin (Hb A1C)  3. Pure hypercholesterolemia 4.Morbid Obesity 5.Chronic Back Pain Try Tizanadine Consider Pain rereferral.  6.OA 7.LE edema Pt wishes to try Lasix prn.      Richard Cranford Mon, MD  [facility]

## 2017-12-20 ENCOUNTER — Telehealth: Payer: Self-pay | Admitting: Family Medicine

## 2017-12-20 NOTE — Telephone Encounter (Signed)
Please review. I remember it was Tizanidine but I cant remember the mg or the fluid pill that you wanted. Thanks!

## 2017-12-20 NOTE — Telephone Encounter (Signed)
Pt stated she had an OV with Dr. Rosanna Randy yesterday 12/19/17 and thought that Dr. Rosanna Randy was going to send an Rx for a diuretic and muscle relaxer to [facility]. Pt stated that [facility] advised her they haven't received an Rx for either type of medications. Pt is requesting the 2 Rx's be sent today of possible. Please advise. Thanks TNP

## 2017-12-21 NOTE — Telephone Encounter (Signed)
Pt called back wanting to know if we had sent the medication in today that were prescribed on the 21st when she came in  Thanks Sobieski

## 2017-12-21 NOTE — Telephone Encounter (Signed)
Please review. Thanks!  

## 2017-12-22 MED ORDER — TIZANIDINE HCL 4 MG PO TABS: 4.0000 mg | ORAL_TABLET | Freq: Three times a day (TID) | ORAL | 1 refills | Status: DC

## 2017-12-22 MED ORDER — FUROSEMIDE 20 MG PO TABS: 20.0000 mg | ORAL_TABLET | Freq: Every day | ORAL | 3 refills | Status: DC

## 2017-12-25 MED ORDER — TIZANIDINE HCL 4 MG PO TABS: 4.0000 mg | ORAL_TABLET | Freq: Three times a day (TID) | ORAL | 2 refills | Status: DC

## 2017-12-25 MED ORDER — FUROSEMIDE 20 MG PO TABS: 20.0000 mg | ORAL_TABLET | Freq: Every day | ORAL | 3 refills | Status: DC

## 2018-01-02 ENCOUNTER — Other Ambulatory Visit: Payer: Self-pay | Admitting: Family Medicine

## 2018-01-02 DIAGNOSIS — E118 Type 2 diabetes mellitus with unspecified complications: Secondary | ICD-10-CM

## 2018-01-05 ENCOUNTER — Other Ambulatory Visit: Payer: Self-pay | Admitting: *Deleted

## 2018-01-05 MED ORDER — PIOGLITAZONE HCL 45 MG PO TABS: ORAL_TABLET | ORAL | 11 refills | Status: DC

## 2018-01-08 ENCOUNTER — Ambulatory Visit (INDEPENDENT_AMBULATORY_CARE_PROVIDER_SITE_OTHER): Payer: Medicare Other | Admitting: Family Medicine

## 2018-01-08 ENCOUNTER — Encounter: Payer: Self-pay | Admitting: Family Medicine

## 2018-01-08 VITALS — BP 142/80 | HR 96 | Temp 98.2°F | Resp 20 | Wt 246.0 lb

## 2018-01-08 DIAGNOSIS — N183 Chronic kidney disease, stage 3 unspecified: Secondary | ICD-10-CM

## 2018-01-08 DIAGNOSIS — Z6841 Body Mass Index (BMI) 40.0 and over, adult: Secondary | ICD-10-CM

## 2018-01-08 DIAGNOSIS — M5136 Other intervertebral disc degeneration, lumbar region: Secondary | ICD-10-CM

## 2018-01-08 DIAGNOSIS — Z23 Encounter for immunization: Secondary | ICD-10-CM | POA: Diagnosis not present

## 2018-01-08 DIAGNOSIS — E118 Type 2 diabetes mellitus with unspecified complications: Secondary | ICD-10-CM | POA: Diagnosis not present

## 2018-01-08 DIAGNOSIS — R609 Edema, unspecified: Secondary | ICD-10-CM

## 2018-01-08 DIAGNOSIS — I10 Essential (primary) hypertension: Secondary | ICD-10-CM | POA: Diagnosis not present

## 2018-01-08 NOTE — Patient Instructions (Signed)
Try knee high support hose to decrease swelling.

## 2018-01-08 NOTE — Progress Notes (Signed)
Patient: Carol Ramsey Female    DOB: [birth year]   72 y.o.   MRN: [id number] Visit Date: 01/08/2018  Today's Provider: [name], MD   Chief Complaint  Patient presents with  . Back Pain  . Leg Swelling   Subjective:    HPI Patient comes in today for a follow up. She was last seen in the office 3 weeks ago.   Chronic back pain- She was advised to start Tizanadine to help with this. She reports that her symptoms are still the same.   Lower extremity edema- Patient was advised to start lasix 20mg  daily to help decrease swelling. Patient reports that the fluid pill has not helped her swelling much either.      Allergies  Allergen Reactions  . Gabapentin Swelling  . Quinine Nausea Only and Nausea And Vomiting  . Amoxicillin Itching and Rash    Has patient had a PCN reaction causing immediate rash, facial/tongue/throat swelling, SOB or lightheadedness with hypotension: No Has patient had a PCN reaction causing severe rash involving mucus membranes or skin necrosis: No Has patient had a PCN reaction that required hospitalization No Has patient had a PCN reaction occurring within the last 10 years: No If all of the above answers are "NO", then may proceed with Cephalosporin use.   . Dilaudid  [Hydromorphone Hcl] Rash  . Etodolac Rash  . Hydrochlorothiazide Rash  . Hydrocodone Rash  . Hydromorphone Rash  . Sulfa Antibiotics Rash     Current Outpatient Medications:  .  acetaminophen (TYLENOL) 500 MG tablet, Take 500 mg by mouth every 6 (six) hours as needed for mild pain or moderate pain., Disp: , Rfl:  .  atorvastatin (LIPITOR) 10 MG tablet, Take 10 mg by mouth daily at 6 PM. , Disp: , Rfl:  .  Cholecalciferol (VITAMIN D) 2000 units CAPS, Take 2,000 Units by mouth daily. , Disp: , Rfl:  .  furosemide (LASIX) 20 MG tablet, Take 1 tablet (20 mg total) by mouth daily., Disp: 30 tablet, Rfl: 3 .  furosemide (LASIX) 20 MG tablet, Take 1 tablet (20 mg total) by mouth  daily., Disp: 30 tablet, Rfl: 3 .  glipiZIDE (GLUCOTROL XL) 5 MG 24 hr tablet, TAKE 1 TABLETS BY MOUTH DAILY, Disp: 90 tablet, Rfl: 3 .  glucose blood (ONETOUCH VERIO) test strip, Use as instructed; Check blood sugar twice daily (Patient taking differently: Use as instructed; Check blood sugar twice daily), Disp: 100 each, Rfl: 12 .  [name] 50-1000 MG tablet, TAKE 1 TABLET BY MOUTH TWICE DAILY WITH A MEAL, Disp: 180 tablet, Rfl: 3 .  lisinopril-hydrochlorothiazide (PRINZIDE,ZESTORETIC) 20-12.5 MG tablet, TAKE 2 TABLETS BY MOUTH EVERY MORNING, Disp: 60 tablet, Rfl: 12 .  metoprolol succinate (TOPROL-XL) 100 MG 24 hr tablet, Take 1 tablet (100 mg total) by mouth daily. Take with or immediately following a meal., Disp: 90 tablet, Rfl: 3 .  pioglitazone (ACTOS) 45 MG tablet, TAKE 1 TABLET(45 MG) BY MOUTH DAILY, Disp: 30 tablet, Rfl: 11 .  tamoxifen (NOLVADEX) 20 MG tablet, TAKE 1 TABLET(20 MG) BY MOUTH DAILY, Disp: 90 tablet, Rfl: 0 .  tiZANidine (ZANAFLEX) 4 MG tablet, Take 1 tablet (4 mg total) by mouth 3 (three) times daily., Disp: 90 tablet, Rfl: 2 .  tiZANidine (ZANAFLEX) 4 MG tablet, Take 1 tablet (4 mg total) by mouth 3 (three) times daily., Disp: 90 tablet, Rfl: 1  Review of Systems  Constitutional: Positive for fatigue. Negative for activity change,  appetite change, chills and diaphoresis.  HENT: Positive for postnasal drip and sneezing.   Eyes: Negative.   Respiratory: Negative for cough and shortness of breath.   Cardiovascular: Positive for leg swelling. Negative for chest pain and palpitations.  Gastrointestinal: Positive for nausea.  Endocrine: Negative.   Musculoskeletal: Positive for arthralgias, back pain, joint swelling and myalgias.  Allergic/Immunologic: Positive for environmental allergies.  Neurological: Negative for dizziness, light-headedness and headaches.  Psychiatric/Behavioral: Negative.     Social History   Tobacco Use  . Smoking status: Never Smoker  .  Smokeless tobacco: Never Used  Substance Use Topics  . Alcohol use: No   Objective:   BP (!) 142/80 (BP Location: Left Arm, Patient Position: Sitting, Cuff Size: Large)   Pulse 96   Temp 98.2 F (36.8 C)   Resp 20   Wt 246 lb (111.6 kg)   SpO2 96%   BMI 43.58 kg/m  Vitals:   01/08/18 1044  BP: (!) 142/80  Pulse: 96  Resp: 20  Temp: 98.2 F (36.8 C)  SpO2: 96%  Weight: 246 lb (111.6 kg)     Physical Exam  Constitutional: She is oriented to person, place, and time. She appears well-developed and well-nourished.  Obese WF NAD.  HENT:  Head: Normocephalic and atraumatic.  Right Ear: External ear normal.  Left Ear: External ear normal.  Nose: Nose normal.  Eyes: Conjunctivae are normal. No scleral icterus.  Neck: No thyromegaly present.  Cardiovascular: Normal rate, regular rhythm and normal heart sounds.  Pulmonary/Chest: Effort normal and breath sounds normal.  Abdominal: Soft.  Musculoskeletal: She exhibits edema.  1+ LE edema.  Neurological: She is alert and oriented to person, place, and time.  Skin: Skin is warm and dry.  Psychiatric: She has a normal mood and affect. Her behavior is normal. Judgment and thought content normal.        Assessment & Plan:     1. DDD (degenerative disc disease), lumbar Refer to pain clinic. Poor sugical candidate due to size. - Ambulatory referral to Pain Clinic  2. Edema, unspecified type Stop ;asix--no help--try knee high support hose.  3. Need for influenza vaccination  - Flu vaccine HIGH DOSE PF (Fluzone High dose)  4. Essential (primary) hypertension   5. Type 2 diabetes mellitus with complication, without long-term current use of insulin ([name])   6. CKD (chronic kidney disease) stage 3, GFR 30-59 ml/min ([name])   7. Class 3 severe obesity due to excess calories with serious comorbidity and body mass index (BMI) of 40.0 to 44.9 in adult [name])         I have done the exam and reviewed the above chart and it  is accurate to the best of my knowledge. Development worker, community has been used in this note in any air is in the dictation or transcription are unintentional.  [name], MD  [place]

## 2018-01-29 ENCOUNTER — Other Ambulatory Visit: Payer: Self-pay | Admitting: Oncology

## 2018-02-01 ENCOUNTER — Encounter: Payer: Self-pay | Admitting: Nurse Practitioner

## 2018-02-01 ENCOUNTER — Other Ambulatory Visit: Payer: Self-pay

## 2018-02-01 ENCOUNTER — Ambulatory Visit: Payer: Medicare Other | Attending: Nurse Practitioner | Admitting: Nurse Practitioner

## 2018-02-01 DIAGNOSIS — N183 Chronic kidney disease, stage 3 (moderate): Secondary | ICD-10-CM | POA: Diagnosis not present

## 2018-02-01 DIAGNOSIS — M5442 Lumbago with sciatica, left side: Secondary | ICD-10-CM

## 2018-02-01 DIAGNOSIS — M533 Sacrococcygeal disorders, not elsewhere classified: Secondary | ICD-10-CM | POA: Diagnosis not present

## 2018-02-01 DIAGNOSIS — M899 Disorder of bone, unspecified: Secondary | ICD-10-CM | POA: Diagnosis not present

## 2018-02-01 DIAGNOSIS — E1122 Type 2 diabetes mellitus with diabetic chronic kidney disease: Secondary | ICD-10-CM | POA: Insufficient documentation

## 2018-02-01 DIAGNOSIS — Z6841 Body Mass Index (BMI) 40.0 and over, adult: Secondary | ICD-10-CM | POA: Insufficient documentation

## 2018-02-01 DIAGNOSIS — E785 Hyperlipidemia, unspecified: Secondary | ICD-10-CM | POA: Diagnosis not present

## 2018-02-01 DIAGNOSIS — Z79899 Other long term (current) drug therapy: Secondary | ICD-10-CM | POA: Diagnosis not present

## 2018-02-01 DIAGNOSIS — G894 Chronic pain syndrome: Secondary | ICD-10-CM | POA: Diagnosis not present

## 2018-02-01 DIAGNOSIS — E039 Hypothyroidism, unspecified: Secondary | ICD-10-CM | POA: Diagnosis not present

## 2018-02-01 DIAGNOSIS — G8929 Other chronic pain: Secondary | ICD-10-CM | POA: Diagnosis not present

## 2018-02-01 DIAGNOSIS — M79605 Pain in left leg: Secondary | ICD-10-CM

## 2018-02-01 DIAGNOSIS — I129 Hypertensive chronic kidney disease with stage 1 through stage 4 chronic kidney disease, or unspecified chronic kidney disease: Secondary | ICD-10-CM | POA: Diagnosis not present

## 2018-02-01 DIAGNOSIS — Z789 Other specified health status: Secondary | ICD-10-CM | POA: Diagnosis not present

## 2018-02-01 NOTE — Progress Notes (Signed)
Patient's Name: Carol Ramsey  MRN: [id number]  Referring Provider: Jerrol Banana.,*  DOB: 03-07-1946  PCP: Jerrol Banana., MD  DOS: 02/01/2018  Note by: Dionisio David NP  Service setting: Ambulatory outpatient  Specialty: Interventional Pain Management  Location: [facility] (AMB) Pain Management Facility    Patient type: New Patient    Primary Reason(s) for Visit: Initial Patient Evaluation CC: Back Pain (low) and Pain (left buttock)  HPI  Carol Ramsey is a 72 y.o. year old, female patient, who comes today for an initial evaluation. She has OBESITY; ACHILLES BURSITIS OR TENDINITIS; BURSITIS, CALCANEAL; Acquired hypothyroidism; Adaptation reaction; Absolute anemia; Cardiac dysrhythmia; Arthropathia; 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; Diabetes mellitus type 2, uncomplicated ([place]); Essential (primary) hypertension; HLD (hyperlipidemia); Extreme obesity; Vitamin D deficiency; Beat, premature ventricular; Pulmonary hypertension ([name]); LVH (left ventricular hypertrophy) due to hypertensive disease, without heart failure; Ductal carcinoma in situ (DCIS) of right breast; CKD (chronic kidney disease) stage 3, GFR 30-59 ml/min ([place]); Bilateral leg edema; Morbid obesity ([place]); Chronic bilateral low back pain with left-sided sciatica (Primary Area of Pain); Chronic pain of left lower extremity (Secondary Area of Pain); Chronic pain syndrome; Pharmacologic therapy; Disorder of skeletal system; Problems influencing health status; and Chronic sacroiliac joint pain on their problem list.. Her primarily concern today is the Back Pain (low) and Pain (left buttock)  Pain Assessment: Location: Lower Back Onset: More than a month ago Duration: Chronic pain Quality: Aching, Tender Severity: 8 /10 (subjective, self-reported pain score)  Note: Reported level is compatible with observation. Clinically the patient looks like a 2/10 A 2/10 is viewed as "Mild to Moderate" and described as noticeable and  distracting. Impossible to hide from other people. More frequent flare-ups. Still possible to adapt and function close to normal. It can be very annoying and may have occasional stronger flare-ups. With discipline, patients may get used to it and adapt. Information on the proper use of the pain scale provided to the patient today. When using our objective Pain Scale, levels between 6 and 10/10 are said to belong in an emergency room, as it progressively worsens from a 6/10, described as severely limiting, requiring emergency care not usually available at an outpatient pain management facility. At a 6/10 level, communication becomes difficult and requires great effort. Assistance to reach the emergency department may be required. Facial flushing and profuse sweating along with potentially dangerous increases in heart rate and blood pressure will be evident. Timing: Intermittent Modifying factors: sitting BP: (!) 141/50  HR: (!) 57  Onset and Duration: Gradual Cause of pain: Unknown Severity: Getting worse, NAS-11 at its worse: 10/10, NAS-11 at its best: 6/10 and NAS-11 on the average: 8/10 Timing: Morning Aggravating Factors: Bending, Prolonged standing and Walking Alleviating Factors: Chiropractic manipulations Associated Problems: Night-time cramps and Swelling Quality of Pain: Aching and Tender Previous Examinations or Tests: MRI scan and Chiropractic evaluation Previous Treatments: Chiropractic manipulations and Physical Therapy  The patient comes into the clinics today for the first time for a chronic pain management evaluation.  According to the patient her primary area of pain is in her lower back.  She feels like the left side is worse than the right.  She denies any precipitating factors.  She admits that is just getting worse over the years.  She admits that the left side there is a tightness in her back.  The right side is an achy type feeling.  She denies any previous surgery.  She did  have 2 injections in the past by Dr. Nanci Pina approximately 3 to 4 years ago.  She does not feel like those injections were effective.  She has done physical therapy he approximately 1 year ago which she states was helpful.  She denies any recent images.  Her second area of pain is in her legs.  She admits that the pain in the left leg goes down the back of her leg into the thigh.  She denies numbness or tingling.  She denies any pain in the right leg.  Has previous nerve conduction studies.  Today I took the time to provide the patient with information regarding this pain practice. The patient was informed that the practice is divided into two sections: an interventional pain management section, as well as a completely separate and distinct medication management section. I explained that there are procedure days for interventional therapies, and evaluation days for follow-ups and medication management. Because of the amount of documentation required during both, they are kept separated. This means that there is the possibility that he may be scheduled for a procedure on one day, and medication management the next. I have also informed her that because of staffing and facility limitations, this practice will no longer take patients for medication management only. To illustrate the reasons for this, I gave the patient the example of surgeons, and how inappropriate it would be to refer a patient to his/her care, just to write for the post-surgical antibiotics on a surgery done by a different surgeon.   Because interventional pain management is part of the board-certified specialty for the doctors, the patient was informed that joining this practice means that they are open to any and all interventional therapies. I made it clear that this does not mean that they will be forced to have any procedures done. What this means is that I believe interventional therapies to be essential part of the diagnosis and proper  management of chronic pain conditions. Therefore, patients not interested in these interventional alternatives will be better served under the care of a different practitioner.  The patient was also made aware of my Comprehensive Pain Management Safety Guidelines where by joining this practice, they limit all of their nerve blocks and joint injections to those done by our practice, for as long as we are retained to manage their care. Historic Controlled Substance Pharmacotherapy Review  PMP and historical list of controlled substances: Tramadol 50 mg, alprazolam 0.5 mg Highest opioid analgesic regimen found: Tramadol 50 mg 1 tablet 4 times daily (last fill date 07/18/2016) tramadol 200 mg/day Most recent opioid analgesic: None Current opioid analgesics: None Highest recorded MME/day: 20 mg/day MME/day: 78m/day Medications: The patient did not bring the medication(s) to the appointment, as requested in our "New Patient Package" Pharmacodynamics: Desired effects: Analgesia: The patient reports >50% benefit. Reported improvement in function: The patient reports medication allows her to accomplish basic ADLs. Clinically meaningful improvement in function (CMIF): Sustained CMIF goals met Perceived effectiveness: Described as relatively effective, allowing for increase in activities of daily living (ADL) Undesirable effects: Side-effects or Adverse reactions: None reported Historical Monitoring: The patient  reports that she does not use drugs. List of all UDS Test(s): No results found for: MDMA, COCAINSCRNUR, PCPSCRNUR, PCPQUANT, CANNABQUANT, THCU, EJacksonList of all Serum Drug Screening Test(s):  No results found for: AMPHSCRSER, BARBSCRSER, BENZOSCRSER, COCAINSCRSER, PCPSCRSER, PCPQUANT, THCSCRSER, CANNABQUANT, OPIATESCRSER, OXYSCRSER, PROPOXSCRSER Historical Background Evaluation: [place] PDMP: Six (6) year initial data search conducted.  [place] Department of public safety, offender search:  Editor, commissioning Information) Non-contributory Risk Assessment Profile: Aberrant behavior: None observed or detected today Risk factors for fatal opioid overdose: None identified today Fatal overdose hazard ratio (HR): Calculation deferred Non-fatal overdose hazard ratio (HR): Calculation deferred Risk of opioid abuse or dependence: 0.7-3.0% with doses ? 36 MME/day and 6.1-26% with doses ? 120 MME/day. Substance use disorder (SUD) risk level: Pending results of Medical Psychology Evaluation for SUD Opioid risk tool (ORT) (Total Score): 0  ORT Scoring interpretation table:  Score <3 = Low Risk for SUD  Score between 4-7 = Moderate Risk for SUD  Score >8 = High Risk for Opioid Abuse   PHQ-2 Depression Scale:  Total score: 0  PHQ-2 Scoring interpretation table: (Score and probability of major depressive disorder)  Score 0 = No depression  Score 1 = 15.4% Probability  Score 2 = 21.1% Probability  Score 3 = 38.4% Probability  Score 4 = 45.5% Probability  Score 5 = 56.4% Probability  Score 6 = 78.6% Probability   PHQ-9 Depression Scale:  Total score: 0  PHQ-9 Scoring interpretation table:  Score 0-4 = No depression  Score 5-9 = Mild depression  Score 10-14 = Moderate depression  Score 15-19 = Moderately severe depression  Score 20-27 = Severe depression (2.4 times higher risk of SUD and 2.89 times higher risk of overuse)   Pharmacologic Plan: Pending ordered tests and/or consults  Meds  The patient has a current medication list which includes the following prescription(s): acetaminophen, atorvastatin, vitamin d, glipizide, glucose blood, janumet, lisinopril-hydrochlorothiazide, metoprolol succinate, pioglitazone, and tamoxifen.  Current Outpatient Medications on File Prior to Visit  Medication Sig  . acetaminophen (TYLENOL) 500 MG tablet Take 500 mg by mouth every 6 (six) hours as needed for mild pain or moderate pain.  [name] atorvastatin (LIPITOR) 10 MG tablet Take 10 mg by mouth daily at 6  PM.   . Cholecalciferol (VITAMIN D) 2000 units CAPS Take 2,000 Units by mouth daily.   [name] glipiZIDE (GLUCOTROL XL) 5 MG 24 hr tablet TAKE 1 TABLETS BY MOUTH DAILY  . glucose blood (ONETOUCH VERIO) test strip Use as instructed; Check blood sugar twice daily (Patient taking differently: Use as instructed; Check blood sugar twice daily)  . [name] 50-1000 MG tablet TAKE 1 TABLET BY MOUTH TWICE DAILY WITH A MEAL  . lisinopril-hydrochlorothiazide (PRINZIDE,ZESTORETIC) 20-12.5 MG tablet TAKE 2 TABLETS BY MOUTH EVERY MORNING  . metoprolol succinate (TOPROL-XL) 100 MG 24 hr tablet Take 1 tablet (100 mg total) by mouth daily. Take with or immediately following a meal.  . pioglitazone (ACTOS) 45 MG tablet TAKE 1 TABLET(45 MG) BY MOUTH DAILY  . tamoxifen (NOLVADEX) 20 MG tablet TAKE 1 TABLET(20 MG) BY MOUTH DAILY   No current facility-administered medications on file prior to visit.    Imaging Review  Note: No new results found.        ROS  Cardiovascular History: Abnormal heart rhythm, High blood pressure, Heart murmur and Needs antibiotics prior to dental procedures Pulmonary or Respiratory History: Coughing up mucus (Bronchitis) Neurological History: No reported neurological signs or symptoms such as seizures, abnormal skin sensations, urinary and/or fecal incontinence, being born with an abnormal open spine and/or a tethered spinal cord Review of Past Neurological Studies: No results found for this or any previous visit. Psychological-Psychiatric History: No reported psychological or psychiatric signs or symptoms such as difficulty sleeping, anxiety, depression, delusions or hallucinations (schizophrenial), mood swings (bipolar disorders) or suicidal ideations or attempts Gastrointestinal  History: No reported gastrointestinal signs or symptoms such as vomiting or evacuating blood, reflux, heartburn, alternating episodes of diarrhea and constipation, inflamed or scarred liver, or pancreas or irrregular  and/or infrequent bowel movements Genitourinary History: Kidney disease Hematological History: Weakness due to low blood hemoglobin or red blood cell count (Anemia) and Brusing easily Endocrine History: High blood sugar requiring insulin (IDDM) Rheumatologic History: Joint aches and or swelling due to excess weight (Osteoarthritis) Musculoskeletal History: Negative for myasthenia gravis, muscular dystrophy, multiple sclerosis or malignant hyperthermia Work History: Retired  Allergies  Carol Ramsey is allergic to gabapentin; quinine; amoxicillin; dilaudid  [hydromorphone hcl]; etodolac; hydrochlorothiazide; hydrocodone; hydromorphone; and sulfa antibiotics.  Laboratory Chemistry  Inflammation Markers Lab Results  Component Value Date   ESRSEDRATE 20 07/11/2011   (CRP: Acute Phase) (ESR: Chronic Phase) Renal Function Markers Lab Results  Component Value Date   BUN 27 09/18/2017   CREATININE 1.36 (H) 09/18/2017   GFRAA 45 (L) 09/18/2017   GFRNONAA 39 (L) 09/18/2017   Hepatic Function Markers Lab Results  Component Value Date   AST 17 09/18/2017   ALT 8 09/18/2017   ALBUMIN 3.9 09/18/2017   ALKPHOS 43 09/18/2017   Electrolytes Lab Results  Component Value Date   NA 141 09/18/2017   K 4.4 09/18/2017   CL 105 09/18/2017   CALCIUM 9.0 09/18/2017   Neuropathy Markers No results found for: [id number] Bone Pathology Markers Lab Results  Component Value Date   ALKPHOS 43 09/18/2017   CALCIUM 9.0 09/18/2017   Coagulation Parameters Lab Results  Component Value Date   INR 1.0 07/11/2011   LABPROT 13.4 07/11/2011   APTT 40.0 (H) 07/11/2011   PLT 225 09/18/2017   Cardiovascular Markers Lab Results  Component Value Date   HGB 11.3 09/18/2017   HCT 36.8 09/18/2017   Note: Lab results reviewed.  [place]  Drug: Carol Ramsey  reports that she does not use drugs. Alcohol:  reports that she does not drink alcohol. Tobacco:  reports that she has never smoked. She has never  used smokeless tobacco. Medical:  has a past medical history of Anemia, Arthritis, Breast cancer ([place]) (06/28/2016), Cancer ([place]), Diabetes mellitus without complication ([place]), Hyperlipidemia, Hypertension, Hyperthyroidism, Obesity, Personal history of radiation therapy, Thyroid disease, and Vitamin D deficiency. Family: family history includes Brain cancer in her sister; Cervical cancer in her mother; Diabetes in her brother; Heart disease in her brother and father; Hypertension in her brother, father, and mother; Lung cancer in her father and sister; Stroke in her brother.  Past Surgical History:  Procedure Laterality Date  . BREAST BIOPSY Right 06/28/2016   stereotactic biopsy - DUCTAL CARCINOMA IN SITU (DCIS), HIGH NUCLEAR GRADE  (very narrow margins)  . BREAST LUMPECTOMY Right 07/18/2016  . JOINT REPLACEMENT Left    tkr  . KNEE SURGERY Left    arthroscopy  . MASTECTOMY, PARTIAL Right 07/18/2016   Procedure: MASTECTOMY PARTIAL;  Surgeon: Robert Bellow, MD;  Location: [facility] ORS;  Service: General;  Laterality: Right;  . SKIN CANCER EXCISION     face   Active Ambulatory Problems    Diagnosis Date Noted  . OBESITY 11/14/2007  . ACHILLES BURSITIS OR TENDINITIS 11/14/2007  . BURSITIS, CALCANEAL 06/26/2007  . Acquired hypothyroidism 09/17/2014  . Adaptation reaction 09/17/2014  . Absolute anemia 09/17/2014  . Cardiac dysrhythmia 09/17/2014  . Arthropathia 09/17/2014  . 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 Diabetes mellitus type 2, uncomplicated ([place]) 73/42/8768  . Essential (primary) hypertension  09/17/2014  . HLD (hyperlipidemia) 09/17/2014  . Extreme obesity 09/17/2014  . Vitamin D deficiency 09/17/2014  . Beat, premature ventricular 12/11/2013  . Pulmonary hypertension ([place]) 02/10/2015  . LVH (left ventricular hypertrophy) due to hypertensive disease, without heart failure 01/19/2016  . 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 CKD  (chronic kidney disease) stage 3, GFR 30-59 ml/min ([name]) 07/21/2016  . Bilateral leg edema 02/21/2017  . Morbid obesity ([place]) 02/01/2018  . Chronic bilateral low back pain with left-sided sciatica (Primary Area of Pain) 02/01/2018  . Chronic pain of left lower extremity (Secondary Area of Pain) 02/01/2018  . Chronic pain syndrome 02/01/2018  . Pharmacologic therapy 02/01/2018  . Disorder of skeletal system 02/01/2018  . Problems influencing health status 02/01/2018  . Chronic sacroiliac joint pain 02/01/2018   Resolved Ambulatory Problems    Diagnosis Date Noted  . Breast neoplasm, Tis (DCIS), right 07/21/2016   Past Medical History:  Diagnosis Date  . Anemia   . Arthritis   . Breast cancer ([place]) 06/28/2016  . Cancer ([place])   . Diabetes mellitus without complication ([place])   . Hyperlipidemia   . Hypertension   . Hyperthyroidism   . Obesity   . Personal history of radiation therapy   . Thyroid disease    Constitutional Exam  General appearance: Well nourished, well developed, and well hydrated. In no apparent acute distress Vitals:   02/01/18 1021  BP: (!) 141/50  Pulse: (!) 57  Temp: 98.2 F (36.8 C)  TempSrc: Oral  Weight: 245 lb (111.1 kg)  Height: '5\' 3"'  (1.6 m)   BMI Assessment: Estimated body mass index is 43.4 kg/m as calculated from the following:   Height as of this encounter: '5\' 3"'  (1.6 m).   Weight as of this encounter: 245 lb (111.1 kg).  BMI interpretation table: BMI level Category Range association with higher incidence of chronic pain  <18 kg/m2 Underweight   18.5-24.9 kg/m2 Ideal body weight   25-29.9 kg/m2 Overweight Increased incidence by 20%  30-34.9 kg/m2 Obese (Class I) Increased incidence by 68%  35-39.9 kg/m2 Severe obesity (Class II) Increased incidence by 136%  >40 kg/m2 Extreme obesity (Class III) Increased incidence by 254%   BMI Readings from Last 4 Encounters:  02/01/18 43.40 kg/m  01/08/18 43.58 kg/m  12/19/17 43.58 kg/m   09/19/17 44.11 kg/m   Wt Readings from Last 4 Encounters:  02/01/18 245 lb (111.1 kg)  01/08/18 246 lb (111.6 kg)  12/19/17 246 lb (111.6 kg)  09/19/17 249 lb (112.9 kg)  Psych/Mental status: Alert, oriented x 3 (person, place, & time)       Eyes: PERLA Respiratory: No evidence of acute respiratory distress  Cervical Spine Exam  Inspection: No masses, redness, or swelling Alignment: Symmetrical Functional ROM: Unrestricted ROM      Stability: No instability detected Muscle strength & Tone: Functionally intact Sensory: Unimpaired Palpation: No palpable anomalies              Upper Extremity (UE) Exam    Side: Right upper extremity  Side: Left upper extremity  Inspection: No masses, redness, swelling, or asymmetry. No contractures  Inspection: No masses, redness, swelling, or asymmetry. No contractures  Functional ROM: Unrestricted ROM          Functional ROM: Unrestricted ROM          Muscle strength & Tone: Functionally intact  Muscle strength & Tone: Functionally intact  Sensory: Unimpaired  Sensory: Unimpaired  Palpation: No  palpable anomalies              Palpation: No palpable anomalies              Specialized Test(s): Deferred         Specialized Test(s): Deferred          Thoracic Spine Exam  Inspection: No masses, redness, or swelling Alignment: Symmetrical Functional ROM: Unrestricted ROM Stability: No instability detected Sensory: Unimpaired Muscle strength & Tone: No palpable anomalies  Lumbar Spine Exam  Inspection: No masses, redness, or swelling Alignment: Symmetrical Functional ROM: Unrestricted ROM      Stability: No instability detected Muscle strength & Tone: Functionally intact Sensory: Unimpaired Palpation: Complains of area being tender to palpation       Provocative Tests: Lumbar Hyperextension and rotation test: Positive bilaterally for facet joint pain.  Leg raises positive less than 45 degrees bilaterally Patrick's Maneuver: Positive for  bilateral S-I arthralgia              Gait & Posture Assessment  Ambulation: Patient ambulates using a wheel chair Gait: Antalgic Posture: Antalgic   Lower Extremity Exam    Side: Right lower extremity  Side: Left lower extremity  Inspection: Edema  Inspection: Edema  Functional ROM: Unrestricted ROM          Functional ROM: Unrestricted ROM          Muscle strength & Tone: Functionally intact  Muscle strength & Tone: Functionally intact  Sensory: Unimpaired  Sensory: Unimpaired  Palpation: No palpable anomalies  Palpation: No palpable anomalies   Assessment  Primary Diagnosis & Pertinent Problem List: Diagnoses of Chronic bilateral low back pain with left-sided sciatica (Primary Area of Pain), Chronic pain of left lower extremity (Secondary Area of Pain), Chronic pain syndrome, Pharmacologic therapy, Disorder of skeletal system, Problems influencing health status, and Chronic sacroiliac joint pain were pertinent to this visit.  Visit Diagnosis: 1. Chronic bilateral low back pain with left-sided sciatica (Primary Area of Pain)   2. Chronic pain of left lower extremity (Secondary Area of Pain)   3. Chronic pain syndrome   4. Pharmacologic therapy   5. Disorder of skeletal system   6. Problems influencing health status   7. Chronic sacroiliac joint pain    Plan of Care  Initial treatment plan:  Please be advised that as per protocol, today's visit has been an evaluation only. We have not taken over the patient's controlled substance management.  Problem-specific plan: No problem-specific Assessment & Plan notes found for this encounter.  Ordered Lab-work, Procedure(s), Referral(s), & Consult(s): Orders Placed This Encounter  Procedures  . DG Lumbar Spine Complete W/Bend  . DG Si Joints  . Compliance Drug Analysis, Ur  . Comp. Metabolic Panel (12)  . Magnesium  . Vitamin B12  . Sedimentation rate  . 25-Hydroxyvitamin D Lcms D2+D3  . C-reactive protein    Pharmacotherapy: Medications ordered:  No orders of the defined types were placed in this encounter.  Medications administered during this visit: Rooney L. Boehning had no medications administered during this visit.   Pharmacotherapy under consideration:  Opioid Analgesics: The patient was informed that there is no guarantee that she would be a candidate for opioid analgesics. The decision will be made following CDC guidelines. This decision will be based on the results of diagnostic studies, as well as Carol Ramsey's risk profile.  Membrane stabilizer: To be determined at a later time Muscle relaxant: To be determined at a later time NSAID:  To be determined at a later time Other analgesic(s): To be determined at a later time   Interventional therapies under consideration: Carol Ramsey was informed that there is no guarantee that she would be a candidate for interventional therapies. The decision will be based on the results of diagnostic studies, as well as Carol Ramsey's risk profile.  Possible procedure(s): Diagnostic left sided lumbar epidural steroid injection Diagnostic bilateral lumbar facet nerve block Diagnostic bilateral sacroiliac joint nerve block Possible bilateral lumbar facet radiofrequency ablation Possible bilateral sacroiliac joint radiofrequency ablation   Provider-requested follow-up: Return for 2nd Visit, w/ Dr. Dossie Arbour.  Future Appointments  Date Time Provider [place]  02/28/2018 10:30 AM Milinda Pointer, MD [facility] None  04/05/2018 11:00 AM Lloyd Huger, MD [facility] None  05/10/2018 10:40 AM Jerrol Banana., MD [facility] None  09/20/2018 10:00 AM [facility]SE HEALTH ADVISOR [facility] None  09/20/2018 10:40 AM Jerrol Banana., MD [facility] None  11/21/2018  9:30 AM Noreene Filbert, MD [facility] None    Primary Care Physician: Jerrol Banana., MD Location: [facility] Facility Note by:  Date: 02/01/2018;  Time: 3:57 PM  Pain Score Disclaimer: We use the NRS-11 scale. This is a self-reported, subjective measurement of pain severity with only modest accuracy. It is used primarily to identify changes within a particular patient. It must be understood that outpatient pain scales are significantly less accurate that those used for research, where they can be applied under ideal controlled circumstances with minimal exposure to variables. In reality, the score is likely to be a combination of pain intensity and pain affect, where pain affect describes the degree of emotional arousal or changes in action readiness caused by the sensory experience of pain. Factors such as social and work situation, setting, emotional state, anxiety levels, expectation, and prior pain experience may influence pain perception and show large inter-individual differences that may also be affected by time variables.  Patient instructions provided during this appointment: Patient Instructions   ____________________________________________________________________________________________  Appointment Policy Summary  It is our goal and responsibility to provide the medical community with assistance in the evaluation and management of patients with chronic pain. Unfortunately our resources are limited. Because we do not have an unlimited amount of time, or available appointments, we are required to closely monitor and manage their use. The following rules exist to maximize their use:  Patient's responsibilities: 1. Punctuality:  At what time should I arrive? You should be physically present in our office 30 minutes before your scheduled appointment. Your scheduled appointment is with your assigned healthcare provider. However, it takes 5-10 minutes to be "checked-in", and another 15 minutes for the nurses to do the admission. If you arrive to our office at the time you were given for your appointment, you will end up being at least  20-25 minutes late to your appointment with the provider. 2. Tardiness:  What happens if I arrive only a few minutes after my scheduled appointment time? You will need to reschedule your appointment. The cutoff is your appointment time. This is why it is so important that you arrive at least 30 minutes before that appointment. If you have an appointment scheduled for 10:00 AM and you arrive at 10:01, you will be required to reschedule your appointment.  3. Plan ahead:  Always assume that you will encounter traffic on your way in. Plan for it. If you are dependent on a driver, make sure they understand these rules and the need to  arrive early. 4. Other appointments and responsibilities:  Avoid scheduling any other appointments before or after your pain clinic appointments.  5. Be prepared:  Write down everything that you need to discuss with your healthcare provider and give this information to the admitting nurse. Write down the medications that you will need refilled. Bring your pills and bottles (even the empty ones), to all of your appointments, except for those where a procedure is scheduled. 6. No children or pets:  Find someone to take care of them. It is not appropriate to bring them in. 7. Scheduling changes:  We request "advanced notification" of any changes or cancellations. 8. Advanced notification:  Defined as a time period of more than 24 hours prior to the originally scheduled appointment. This allows for the appointment to be offered to other patients. 9. Rescheduling:  When a visit is rescheduled, it will require the cancellation of the original appointment. For this reason they both fall within the category of "Cancellations".  10. Cancellations:  They require advanced notification. Any cancellation less than 24 hours before the  appointment will be recorded as a "No Show". 11. No Show:  Defined as an unkept appointment where the patient failed to notify or declare to the  practice their intention or inability to keep the appointment.  Corrective process for repeat offenders:  1. Tardiness: Three (3) episodes of rescheduling due to late arrivals will be recorded as one (1) "No Show". 2. Cancellation or reschedule: Three (3) cancellations or rescheduling will be recorded as one (1) "No Show". 3. "No Shows": Three (3) "No Shows" within a 12 month period will result in discharge from the practice. ____________________________________________________________________________________________  ____________________________________________________________________________________________  Pain Scale  Introduction: The pain score used by this practice is the Verbal Numerical Rating Scale (VNRS-11). This is an 11-point scale. It is for adults and children 10 years or older. There are significant differences in how the pain score is reported, used, and applied. Forget everything you learned in the past and learn this scoring system.  General Information: The scale should reflect your current level of pain. Unless you are specifically asked for the level of your worst pain, or your average pain. If you are asked for one of these two, then it should be understood that it is over the past 24 hours.  Basic Activities of Daily Living (ADL): Personal hygiene, dressing, eating, transferring, and using restroom.  Instructions: Most patients tend to report their level of pain as a combination of two factors, their physical pain and their psychosocial pain. This last one is also known as "suffering" and it is reflection of how physical pain affects you socially and psychologically. From now on, report them separately. From this point on, when asked to report your pain level, report only your physical pain. Use the following table for reference.  Pain Clinic Pain Levels (0-5/10)  Pain Level Score  Description  No Pain 0   Mild pain 1 Nagging, annoying, but does not interfere with basic  activities of daily living (ADL). Patients are able to eat, bathe, get dressed, toileting (being able to get on and off the toilet and perform personal hygiene functions), transfer (move in and out of bed or a chair without assistance), and maintain continence (able to control bladder and bowel functions). Blood pressure and heart rate are unaffected. A normal heart rate for a healthy adult ranges from 60 to 100 bpm (beats per minute).   Mild to moderate pain 2 Noticeable and distracting.  Impossible to hide from other people. More frequent flare-ups. Still possible to adapt and function close to normal. It can be very annoying and may have occasional stronger flare-ups. With discipline, patients may get used to it and adapt.   Moderate pain 3 Interferes significantly with activities of daily living (ADL). It becomes difficult to feed, bathe, get dressed, get on and off the toilet or to perform personal hygiene functions. Difficult to get in and out of bed or a chair without assistance. Very distracting. With effort, it can be ignored when deeply involved in activities.   Moderately severe pain 4 Impossible to ignore for more than a few minutes. With effort, patients may still be able to manage work or participate in some social activities. Very difficult to concentrate. Signs of autonomic nervous system discharge are evident: dilated pupils (mydriasis); mild sweating (diaphoresis); sleep interference. Heart rate becomes elevated (>115 bpm). Diastolic blood pressure (lower number) rises above 100 mmHg. Patients find relief in laying down and not moving.   Severe pain 5 Intense and extremely unpleasant. Associated with frowning face and frequent crying. Pain overwhelms the senses.  Ability to do any activity or maintain social relationships becomes significantly limited. Conversation becomes difficult. Pacing back and forth is common, as getting into a comfortable position is nearly impossible. Pain wakes you  up from deep sleep. Physical signs will be obvious: pupillary dilation; increased sweating; goosebumps; brisk reflexes; cold, clammy hands and feet; nausea, vomiting or dry heaves; loss of appetite; significant sleep disturbance with inability to fall asleep or to remain asleep. When persistent, significant weight loss is observed due to the complete loss of appetite and sleep deprivation.  Blood pressure and heart rate becomes significantly elevated. Caution: If elevated blood pressure triggers a pounding headache associated with blurred vision, then the patient should immediately seek attention at an urgent or emergency care unit, as these may be signs of an impending stroke.    Emergency Department Pain Levels (6-10/10)  Emergency Room Pain 6 Severely limiting. Requires emergency care and should not be seen or managed at an outpatient pain management facility. Communication becomes difficult and requires great effort. Assistance to reach the emergency department may be required. Facial flushing and profuse sweating along with potentially dangerous increases in heart rate and blood pressure will be evident.   Distressing pain 7 Self-care is very difficult. Assistance is required to transport, or use restroom. Assistance to reach the emergency department will be required. Tasks requiring coordination, such as bathing and getting dressed become very difficult.   Disabling pain 8 Self-care is no longer possible. At this level, pain is disabling. The individual is unable to do even the most "basic" activities such as walking, eating, bathing, dressing, transferring to a bed, or toileting. Fine motor skills are lost. It is difficult to think clearly.   Incapacitating pain 9 Pain becomes incapacitating. Thought processing is no longer possible. Difficult to remember your own name. Control of movement and coordination are lost.   The worst pain imaginable 10 At this level, most patients pass out from pain.  When this level is reached, collapse of the autonomic nervous system occurs, leading to a sudden drop in blood pressure and heart rate. This in turn results in a temporary and dramatic drop in blood flow to the brain, leading to a loss of consciousness. Fainting is one of the body's self defense mechanisms. Passing out puts the brain in a calmed state and causes it to shut down for a  while, in order to begin the healing process.    Summary: 1. Refer to this scale when providing [place] with your pain level. 2. Be accurate and careful when reporting your pain level. This will help with your care. 3. Over-reporting your pain level will lead to loss of credibility. 4. Even a level of 1/10 means that there is pain and will be treated at our facility. 5. High, inaccurate reporting will be documented as "Symptom Exaggeration", leading to loss of credibility and suspicions of possible secondary gains such as obtaining more narcotics, or wanting to appear disabled, for fraudulent reasons. 6. Only pain levels of 5 or below will be seen at our facility. 7. Pain levels of 6 and above will be sent to the Emergency Department and the appointment cancelled. ____________________________________________________________________________________________   BMI Assessment: Estimated body mass index is 43.4 kg/m as calculated from the following:   Height as of this encounter: '5\' 3"'  (1.6 m).   Weight as of this encounter: 245 lb (111.1 kg).  BMI interpretation table: BMI level Category Range association with higher incidence of chronic pain  <18 kg/m2 Underweight   18.5-24.9 kg/m2 Ideal body weight   25-29.9 kg/m2 Overweight Increased incidence by 20%  30-34.9 kg/m2 Obese (Class I) Increased incidence by 68%  35-39.9 kg/m2 Severe obesity (Class II) Increased incidence by 136%  >40 kg/m2 Extreme obesity (Class III) Increased incidence by 254%   BMI Readings from Last 4 Encounters:  02/01/18 43.40 kg/m  01/08/18  43.58 kg/m  12/19/17 43.58 kg/m  09/19/17 44.11 kg/m   Wt Readings from Last 4 Encounters:  02/01/18 245 lb (111.1 kg)  01/08/18 246 lb (111.6 kg)  12/19/17 246 lb (111.6 kg)  09/19/17 249 lb (112.9 kg)

## 2018-02-01 NOTE — Patient Instructions (Addendum)
____________________________________________________________________________________________  Appointment Policy Summary  It is our goal and responsibility to provide the medical community with assistance in the evaluation and management of patients with chronic pain. Unfortunately our resources are limited. Because we do not have an unlimited amount of time, or available appointments, we are required to closely monitor and manage their use. The following rules exist to maximize their use:  Patient's responsibilities: 1. Punctuality:  At what time should I arrive? You should be physically present in our office 30 minutes before your scheduled appointment. Your scheduled appointment is with your assigned healthcare provider. However, it takes 5-10 minutes to be "checked-in", and another 15 minutes for the nurses to do the admission. If you arrive to our office at the time you were given for your appointment, you will end up being at least 20-25 minutes late to your appointment with the provider. 2. Tardiness:  What happens if I arrive only a few minutes after my scheduled appointment time? You will need to reschedule your appointment. The cutoff is your appointment time. This is why it is so important that you arrive at least 30 minutes before that appointment. If you have an appointment scheduled for 10:00 AM and you arrive at 10:01, you will be required to reschedule your appointment.  3. Plan ahead:  Always assume that you will encounter traffic on your way in. Plan for it. If you are dependent on a driver, make sure they understand these rules and the need to arrive early. 4. Other appointments and responsibilities:  Avoid scheduling any other appointments before or after your pain clinic appointments.  5. Be prepared:  Write down everything that you need to discuss with your healthcare provider and give this information to the admitting nurse. Write down the medications that you will need  refilled. Bring your pills and bottles (even the empty ones), to all of your appointments, except for those where a procedure is scheduled. 6. No children or pets:  Find someone to take care of them. It is not appropriate to bring them in. 7. Scheduling changes:  We request "advanced notification" of any changes or cancellations. 8. Advanced notification:  Defined as a time period of more than 24 hours prior to the originally scheduled appointment. This allows for the appointment to be offered to other patients. 9. Rescheduling:  When a visit is rescheduled, it will require the cancellation of the original appointment. For this reason they both fall within the category of "Cancellations".  10. Cancellations:  They require advanced notification. Any cancellation less than 24 hours before the  appointment will be recorded as a "No Show". 11. No Show:  Defined as an unkept appointment where the patient failed to notify or declare to the practice their intention or inability to keep the appointment.  Corrective process for repeat offenders:  1. Tardiness: Three (3) episodes of rescheduling due to late arrivals will be recorded as one (1) "No Show". 2. Cancellation or reschedule: Three (3) cancellations or rescheduling will be recorded as one (1) "No Show". 3. "No Shows": Three (3) "No Shows" within a 12 month period will result in discharge from the practice. ____________________________________________________________________________________________  ____________________________________________________________________________________________  Pain Scale  Introduction: The pain score used by this practice is the Verbal Numerical Rating Scale (VNRS-11). This is an 11-point scale. It is for adults and children 10 years or older. There are significant differences in how the pain score is reported, used, and applied. Forget everything you learned in the past and learn  this scoring system.  General  Information: The scale should reflect your current level of pain. Unless you are specifically asked for the level of your worst pain, or your average pain. If you are asked for one of these two, then it should be understood that it is over the past 24 hours.  Basic Activities of Daily Living (ADL): Personal hygiene, dressing, eating, transferring, and using restroom.  Instructions: Most patients tend to report their level of pain as a combination of two factors, their physical pain and their psychosocial pain. This last one is also known as "suffering" and it is reflection of how physical pain affects you socially and psychologically. From now on, report them separately. From this point on, when asked to report your pain level, report only your physical pain. Use the following table for reference.  Pain Clinic Pain Levels (0-5/10)  Pain Level Score  Description  No Pain 0   Mild pain 1 Nagging, annoying, but does not interfere with basic activities of daily living (ADL). Patients are able to eat, bathe, get dressed, toileting (being able to get on and off the toilet and perform personal hygiene functions), transfer (move in and out of bed or a chair without assistance), and maintain continence (able to control bladder and bowel functions). Blood pressure and heart rate are unaffected. A normal heart rate for a healthy adult ranges from 60 to 100 bpm (beats per minute).   Mild to moderate pain 2 Noticeable and distracting. Impossible to hide from other people. More frequent flare-ups. Still possible to adapt and function close to normal. It can be very annoying and may have occasional stronger flare-ups. With discipline, patients may get used to it and adapt.   Moderate pain 3 Interferes significantly with activities of daily living (ADL). It becomes difficult to feed, bathe, get dressed, get on and off the toilet or to perform personal hygiene functions. Difficult to get in and out of bed or a chair  without assistance. Very distracting. With effort, it can be ignored when deeply involved in activities.   Moderately severe pain 4 Impossible to ignore for more than a few minutes. With effort, patients may still be able to manage work or participate in some social activities. Very difficult to concentrate. Signs of autonomic nervous system discharge are evident: dilated pupils (mydriasis); mild sweating (diaphoresis); sleep interference. Heart rate becomes elevated (>115 bpm). Diastolic blood pressure (lower number) rises above 100 mmHg. Patients find relief in laying down and not moving.   Severe pain 5 Intense and extremely unpleasant. Associated with frowning face and frequent crying. Pain overwhelms the senses.  Ability to do any activity or maintain social relationships becomes significantly limited. Conversation becomes difficult. Pacing back and forth is common, as getting into a comfortable position is nearly impossible. Pain wakes you up from deep sleep. Physical signs will be obvious: pupillary dilation; increased sweating; goosebumps; brisk reflexes; cold, clammy hands and feet; nausea, vomiting or dry heaves; loss of appetite; significant sleep disturbance with inability to fall asleep or to remain asleep. When persistent, significant weight loss is observed due to the complete loss of appetite and sleep deprivation.  Blood pressure and heart rate becomes significantly elevated. Caution: If elevated blood pressure triggers a pounding headache associated with blurred vision, then the patient should immediately seek attention at an urgent or emergency care unit, as these may be signs of an impending stroke.    Emergency Department Pain Levels (6-10/10)  Emergency Room Pain 6 Severely   limiting. Requires emergency care and should not be seen or managed at an outpatient pain management facility. Communication becomes difficult and requires great effort. Assistance to reach the emergency department  may be required. Facial flushing and profuse sweating along with potentially dangerous increases in heart rate and blood pressure will be evident.   Distressing pain 7 Self-care is very difficult. Assistance is required to transport, or use restroom. Assistance to reach the emergency department will be required. Tasks requiring coordination, such as bathing and getting dressed become very difficult.   Disabling pain 8 Self-care is no longer possible. At this level, pain is disabling. The individual is unable to do even the most "basic" activities such as walking, eating, bathing, dressing, transferring to a bed, or toileting. Fine motor skills are lost. It is difficult to think clearly.   Incapacitating pain 9 Pain becomes incapacitating. Thought processing is no longer possible. Difficult to remember your own name. Control of movement and coordination are lost.   The worst pain imaginable 10 At this level, most patients pass out from pain. When this level is reached, collapse of the autonomic nervous system occurs, leading to a sudden drop in blood pressure and heart rate. This in turn results in a temporary and dramatic drop in blood flow to the brain, leading to a loss of consciousness. Fainting is one of the body's self defense mechanisms. Passing out puts the brain in a calmed state and causes it to shut down for a while, in order to begin the healing process.    Summary: 1. Refer to this scale when providing [place] with your pain level. 2. Be accurate and careful when reporting your pain level. This will help with your care. 3. Over-reporting your pain level will lead to loss of credibility. 4. Even a level of 1/10 means that there is pain and will be treated at our facility. 5. High, inaccurate reporting will be documented as "Symptom Exaggeration", leading to loss of credibility and suspicions of possible secondary gains such as obtaining more narcotics, or wanting to appear disabled, for  fraudulent reasons. 6. Only pain levels of 5 or below will be seen at our facility. 7. Pain levels of 6 and above will be sent to the Emergency Department and the appointment cancelled. ____________________________________________________________________________________________   BMI Assessment: Estimated body mass index is 43.4 kg/m as calculated from the following:   Height as of this encounter: 5\' 3"  (1.6 m).   Weight as of this encounter: 245 lb (111.1 kg).  BMI interpretation table: BMI level Category Range association with higher incidence of chronic pain  <18 kg/m2 Underweight   18.5-24.9 kg/m2 Ideal body weight   25-29.9 kg/m2 Overweight Increased incidence by 20%  30-34.9 kg/m2 Obese (Class I) Increased incidence by 68%  35-39.9 kg/m2 Severe obesity (Class II) Increased incidence by 136%  >40 kg/m2 Extreme obesity (Class III) Increased incidence by 254%   BMI Readings from Last 4 Encounters:  02/01/18 43.40 kg/m  01/08/18 43.58 kg/m  12/19/17 43.58 kg/m  09/19/17 44.11 kg/m   Wt Readings from Last 4 Encounters:  02/01/18 245 lb (111.1 kg)  01/08/18 246 lb (111.6 kg)  12/19/17 246 lb (111.6 kg)  09/19/17 249 lb (112.9 kg)

## 2018-02-01 NOTE — Progress Notes (Signed)
Safety precautions to be maintained throughout the outpatient stay will include: orient to surroundings, keep bed in low position, maintain call bell within reach at all times, provide assistance with transfer out of bed and ambulation.  

## 2018-02-05 ENCOUNTER — Ambulatory Visit
Admission: RE | Admit: 2018-02-05 | Discharge: 2018-02-05 | Disposition: A | Discharge status: Home / Self Care | Payer: Medicare Other | Source: Ambulatory Visit | Attending: Nurse Practitioner | Admitting: Nurse Practitioner

## 2018-02-05 DIAGNOSIS — M533 Sacrococcygeal disorders, not elsewhere classified: Secondary | ICD-10-CM | POA: Insufficient documentation

## 2018-02-05 DIAGNOSIS — M5442 Lumbago with sciatica, left side: Secondary | ICD-10-CM | POA: Insufficient documentation

## 2018-02-05 DIAGNOSIS — M419 Scoliosis, unspecified: Secondary | ICD-10-CM | POA: Insufficient documentation

## 2018-02-05 DIAGNOSIS — G8929 Other chronic pain: Secondary | ICD-10-CM | POA: Insufficient documentation

## 2018-02-05 DIAGNOSIS — I700 Atherosclerosis of aorta: Secondary | ICD-10-CM | POA: Insufficient documentation

## 2018-02-05 DIAGNOSIS — M4807 Spinal stenosis, lumbosacral region: Secondary | ICD-10-CM | POA: Diagnosis not present

## 2018-02-05 DIAGNOSIS — M899 Disorder of bone, unspecified: Secondary | ICD-10-CM | POA: Diagnosis not present

## 2018-02-05 DIAGNOSIS — M47816 Spondylosis without myelopathy or radiculopathy, lumbar region: Secondary | ICD-10-CM | POA: Diagnosis not present

## 2018-02-05 DIAGNOSIS — Z789 Other specified health status: Secondary | ICD-10-CM | POA: Diagnosis not present

## 2018-02-05 DIAGNOSIS — Z79899 Other long term (current) drug therapy: Secondary | ICD-10-CM | POA: Diagnosis not present

## 2018-02-05 DIAGNOSIS — M4186 Other forms of scoliosis, lumbar region: Secondary | ICD-10-CM | POA: Diagnosis not present

## 2018-02-06 ENCOUNTER — Encounter: Payer: Self-pay | Admitting: Nurse Practitioner

## 2018-02-06 DIAGNOSIS — E538 Deficiency of other specified B group vitamins: Secondary | ICD-10-CM | POA: Insufficient documentation

## 2018-02-06 NOTE — Progress Notes (Signed)
Results were reviewed and found to be: abnormal  No acute injury or pathology identified  Review would suggest interventional pain management techniques may be of benefit

## 2018-02-06 NOTE — Progress Notes (Signed)
Results were reviewed and found to be: mildly abnormal  No acute injury or pathology identified  Review would suggest interventional pain management techniques may be of benefit 

## 2018-02-08 LAB — COMPLIANCE DRUG ANALYSIS, UR

## 2018-02-09 LAB — COMP. METABOLIC PANEL (12)
A/G RATIO: 1.2 (ref 1.2–2.2)
ALBUMIN: 3.4 g/dL — AB (ref 3.5–4.8)
AST: 10 IU/L (ref 0–40)
Alkaline Phosphatase: 35 IU/L — ABNORMAL LOW (ref 39–117)
BILIRUBIN TOTAL: 0.3 mg/dL (ref 0.0–1.2)
BUN/Creatinine Ratio: 24 (ref 12–28)
BUN: 36 mg/dL — ABNORMAL HIGH (ref 8–27)
CHLORIDE: 102 mmol/L (ref 96–106)
Calcium: 9.1 mg/dL (ref 8.7–10.3)
Creatinine, Ser: 1.49 mg/dL — ABNORMAL HIGH (ref 0.57–1.00)
GFR calc Af Amer: 40 mL/min/{1.73_m2} — ABNORMAL LOW (ref >59)
GFR calc non Af Amer: 35 mL/min/{1.73_m2} — ABNORMAL LOW (ref >59)
GLOBULIN, TOTAL: 2.8 g/dL (ref 1.5–4.5)
Glucose: 145 mg/dL — ABNORMAL HIGH (ref 65–99)
POTASSIUM: 4.3 mmol/L (ref 3.5–5.2)
SODIUM: 137 mmol/L (ref 134–144)
Total Protein: 6.2 g/dL (ref 6.0–8.5)

## 2018-02-09 LAB — C-REACTIVE PROTEIN: CRP: <1 mg/L (ref 0–10)

## 2018-02-09 LAB — 25-HYDROXYVITAMIN D LCMS D2+D3
25-HYDROXY, VITAMIN D-2: 2.6 ng/mL
25-HYDROXY, VITAMIN D: 29 ng/mL — AB

## 2018-02-09 LAB — 25-HYDROXY VITAMIN D LCMS D2+D3: 25-Hydroxy, Vitamin D-3: 26 ng/mL

## 2018-02-09 LAB — MAGNESIUM: MAGNESIUM: 1.5 mg/dL — AB (ref 1.6–2.3)

## 2018-02-09 LAB — SEDIMENTATION RATE: Sed Rate: 34 mm/hr (ref 0–40)

## 2018-02-09 LAB — VITAMIN B12: Vitamin B-12: <150 pg/mL — ABNORMAL LOW (ref 232–1245)

## 2018-02-14 DIAGNOSIS — E782 Mixed hyperlipidemia: Secondary | ICD-10-CM | POA: Diagnosis not present

## 2018-02-14 DIAGNOSIS — I272 Pulmonary hypertension, unspecified: Secondary | ICD-10-CM | POA: Diagnosis not present

## 2018-02-14 DIAGNOSIS — N183 Chronic kidney disease, stage 3 (moderate): Secondary | ICD-10-CM | POA: Diagnosis not present

## 2018-02-14 DIAGNOSIS — R600 Localized edema: Secondary | ICD-10-CM | POA: Diagnosis not present

## 2018-02-14 DIAGNOSIS — I493 Ventricular premature depolarization: Secondary | ICD-10-CM | POA: Diagnosis not present

## 2018-02-14 DIAGNOSIS — E119 Type 2 diabetes mellitus without complications: Secondary | ICD-10-CM | POA: Diagnosis not present

## 2018-02-14 DIAGNOSIS — I10 Essential (primary) hypertension: Secondary | ICD-10-CM | POA: Diagnosis not present

## 2018-02-26 ENCOUNTER — Ambulatory Visit: Payer: Medicare Other | Admitting: Pain Medicine

## 2018-02-26 NOTE — Progress Notes (Signed)
Patient's Name: Carol Ramsey  MRN: [id number]  Referring Provider: Jerrol Banana.,*  DOB: March 23, 1946  PCP: Jerrol Banana., MD  DOS: 02/28/2018  Note by: Gaspar Cola, MD  Service setting: Ambulatory outpatient  Specialty: Interventional Pain Management  Location: [facility] (AMB) Pain Management Facility    Patient type: Established   Primary Reason(s) for Visit: Encounter for evaluation before starting new chronic pain management plan of care (Level of risk: moderate) CC: Back Pain  HPI  Carol Ramsey is a 72 y.o. year old, female patient, who comes today for a follow-up evaluation to review the test results and decide on a treatment plan. 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; Diabetes mellitus type 2, uncomplicated ([place]); Essential (primary) hypertension; HLD (hyperlipidemia); Extreme obesity; Vitamin D insufficiency; Beat, premature ventricular; Pulmonary hypertension ([name]); LVH (left ventricular hypertrophy) due to hypertensive disease, without heart failure; Ductal carcinoma in situ (DCIS) of right breast; CKD (chronic kidney disease) stage 3, GFR 30-59 ml/min ([place]); Bilateral leg edema; Morbid obesity ([place]); Chronic low back pain (Primary Area of Pain) (Bilateral) w/ sciatica (Left); Chronic lower extremity pain (Secondary Area of Pain) (Left); Chronic pain syndrome; Pharmacologic therapy; Disorder of skeletal system; Problems influencing health status; Chronic sacroiliac joint pain (Left); Low serum vitamin B12; Morbid obesity with BMI of 40.0-44.9, adult ([place]); Hypoalbuminemia; Hypomagnesemia; Vitamin B 12 deficiency; DDD (degenerative disc disease), lumbar; Osteoarthritis of sacroiliac joint (Left); Somatic dysfunction of sacroiliac joint (Left); Sacroiliac joint  dysfunction (Left); Abnormal MRI, lumbar spine (07/22/2013); Lumbar facet syndrome (Bilateral); Lumbar spondylosis; 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 Her primarily concern today is the Back Pain  Pain Assessment: Location: Right, Left, Lower Back(more pain on the left side in the morning) Radiating: pain radiaties down left hip Onset: More than a month ago Duration: Chronic pain Quality: Aching, Constant, Discomfort Severity: 9 /10 (subjective, self-reported pain score)  Note: Reported level is inconsistent with clinical observations. Clinically the patient looks like a 3/10 A 3/10 is viewed as "Moderate" and described as significantly interfering with activities of daily living (ADL). It becomes difficult to feed, bathe, get dressed, get on and off the toilet or to perform personal hygiene functions. Difficult to get in and out of bed or a chair without assistance. Very distracting. With effort, it can be ignored when deeply involved in activities. Information on the proper use of the pain scale provided to the patient today. When using our objective Pain Scale, levels between 6 and 10/10 are said to belong in an emergency room, as it progressively worsens from a 6/10, described as severely limiting, requiring emergency care not usually available at an outpatient pain management facility. At a 6/10 level, communication becomes difficult and requires great effort. Assistance to reach the emergency department may be required. Facial flushing and profuse sweating along with potentially dangerous increases in heart rate and blood pressure will be evident. Effect on ADL: unable to do anything Timing: Constant Modifying factors: sit down, tylenol helps  little bit BP: (!) 149/48  HR: (!) 51  Carol Ramsey comes in today for a follow-up visit after her initial evaluation  on 02/01/2018. Today we went over the results of her tests. These were explained in "Layman's terms". During today's appointment we went over  my diagnostic impression, as well as the proposed treatment plan.  According to the patient her primary area of pain is in her lower back (B) (L>R).  She feels like the left side is worse than the right.  She denies any precipitating factors.  She admits that is just getting worse over the years.  She admits that the left side there is a tightness in her back.  The right side is an achy type feeling.  She denies any previous surgery.  She did have 2 injections in the past by Dr. Sharlet Salina approximately 3 to 4 years ago.  She does not feel like those injections were not effective.  She has done physical therapy he approximately 1 year ago which she states was helpful.  She denies any recent images.  Her second area of pain is in her leg (L).  She admits that the pain in the left leg goes down the back of her leg into the thigh, does not reach the knee.  She denies numbness or tingling.  She denies any pain in the right leg.  Has previous nerve conduction studies.  Right knee pain needs replacement. Dr Marry Guan has been working with her. Did not do any injections.   In considering the treatment plan options, Carol Ramsey was reminded that I no longer take patients for medication management only. I asked her to let me know if she had no intention of taking advantage of the interventional therapies, so that we could make arrangements to provide this space to someone interested. I also made it clear that undergoing interventional therapies for the purpose of getting pain medications is very inappropriate on the part of a patient, and it will not be tolerated in this practice. This type of behavior would suggest true addiction and therefore it requires referral to an addiction specialist.   Further details on both, my assessment(s), as well as the proposed treatment plan,  please see below.  Controlled Substance Pharmacotherapy Assessment REMS (Risk Evaluation and Mitigation Strategy)  Analgesic: Tramadol 50 mg 2 tablets p.o. every 8 hours #30 (300 mg/day) (07/18/2016) (Did not take it. Given to her after breast surgery for a stage 0 breast cancer surgery.) Highest recorded MME/day: 30 mg/day MME/day: 0 mg/day Pill Count: None expected due to no prior prescriptions written by our practice. No notes on file Pharmacokinetics: Liberation and absorption (onset of action): WNL Distribution (time to peak effect): WNL Metabolism and excretion (duration of action): WNL         Pharmacodynamics: Desired effects: Analgesia: Ms. Holtmeyer reports >50% benefit. Functional ability: Patient reports that medication allows her to accomplish basic ADLs Clinically meaningful improvement in function (CMIF): Sustained CMIF goals met Perceived effectiveness: Described as relatively effective, allowing for increase in activities of daily living (ADL) Undesirable effects: Side-effects or Adverse reactions: None reported Monitoring: [place] PMP: Online review of the past 32-monthperiod previously conducted. Not applicable at this point since we have not taken over the patient's medication management yet. List of other Serum/Urine Drug Screening Test(s):  No results found. List of all UDS test(s) done:  Lab Results  Component Value Date   SUMMARY FINAL 02/01/2018   Last UDS on record: Summary  Date Value Ref Range Status  02/01/2018 FINAL  Final    Comment:    ==================================================================== TOXASSURE COMP DRUG ANALYSIS,UR ==================================================================== Test  Result       Flag       Units Drug Present and Declared for Prescription Verification   Acetaminophen                  PRESENT      EXPECTED   Metoprolol                     PRESENT      EXPECTED Drug Present not  Declared for Prescription Verification   Diphenhydramine                PRESENT      UNEXPECTED ==================================================================== Test                      Result    Flag   Units      Ref Range   Creatinine              139              mg/dL      >=20 ==================================================================== Declared Medications:  The flagging and interpretation on this report are based on the  following declared medications.  Unexpected results may arise from  inaccuracies in the declared medications.  **Note: The testing scope of this panel includes these medications:  Metoprolol (Toprol)  **Note: The testing scope of this panel does not include small to  moderate amounts of these reported medications:  Acetaminophen (Tylenol)  **Note: The testing scope of this panel does not include following  reported medications:  Atorvastatin (Lipitor)  Glipizide (Glucotrol)  Hydrochlorothiazide (Lisinopril-HCTZ)  Lisinopril (Lisinopril-HCTZ)  Metformin ([name])  Pioglitazone (Actos)  Sitagliptin ([name])  Tamoxifen  Vitamin D ==================================================================== For clinical consultation, please call [phone]. ====================================================================    UDS interpretation: No unexpected findings.          Medication Assessment Form: Patient introduced to form today Treatment compliance: Treatment may start today if patient agrees with proposed plan. Evaluation of compliance is not applicable at this point Risk Assessment Profile: Aberrant behavior: See initial evaluations. None observed or detected today Comorbid factors increasing risk of overdose: See initial evaluation. No additional risks detected today Opioid risk tool (ORT) (Total Score): 0 Personal History of Substance Abuse (SUD-Substance use disorder):  Alcohol: Negative  Illegal Drugs: Negative  Rx Drugs:  Negative  ORT Risk Level calculation: Low Risk Risk of substance use disorder (SUD): Low Opioid Risk Tool - 02/28/18 1116      Family History of Substance Abuse   Alcohol  Negative    Illegal Drugs  Negative    Rx Drugs  Negative      Personal History of Substance Abuse   Alcohol  Negative    Illegal Drugs  Negative    Rx Drugs  Negative      Age   Age between 12-45 years   No      History of Preadolescent Sexual Abuse   History of Preadolescent Sexual Abuse  Negative or Female      Psychological Disease   Psychological Disease  Negative    Depression  Negative      Total Score   Opioid Risk Tool Scoring  0    Opioid Risk Interpretation  Low Risk      ORT Scoring interpretation table:  Score <3 = Low Risk for SUD  Score between 4-7 = Moderate Risk for SUD  Score >8 = High Risk for Opioid Abuse   Risk  Mitigation Strategies:  Patient opioid safety counseling: Completed today. Counseling provided to patient as per "Patient Counseling Document". Document signed by patient, attesting to counseling and understanding Patient-Prescriber Agreement (PPA): Obtained today.  Controlled substance notification to other providers: Written and sent today.  Pharmacologic Plan: Today we may be taking over the patient's pharmacological regimen. See below.             Laboratory Chemistry  Inflammation Markers (CRP: Acute Phase) (ESR: Chronic Phase) Lab Results  Component Value Date   CRP <1 02/05/2018   ESRSEDRATE 34 02/05/2018                         Rheumatology Markers No results found.  Renal Function Markers Lab Results  Component Value Date   BUN 36 (H) 02/05/2018   CREATININE 1.49 (H) 02/05/2018   BCR 24 02/05/2018   GFRAA 40 (L) 02/05/2018   GFRNONAA 35 (L) 02/05/2018  Dr. Laqueta Carina is working with this, according to her.               Hepatic Function Markers Lab Results  Component Value Date   AST 10 02/05/2018   ALT 8 09/18/2017   ALBUMIN 3.4 (L) 02/05/2018    ALKPHOS 35 (L) 02/05/2018                        Electrolytes Lab Results  Component Value Date   NA 137 02/05/2018   K 4.3 02/05/2018   CL 102 02/05/2018   CALCIUM 9.1 02/05/2018   MG 1.5 (L) 02/05/2018   PHOS 3.5 04/13/2015                        Neuropathy Markers Lab Results  Component Value Date   VITAMINB12 <150 (L) 02/05/2018   HGBA1C 7.6 (A) 12/19/2017                        CNS Tests No results found.  Bone Pathology Markers Lab Results  Component Value Date   25OHVITD1 29 (L) 02/05/2018   25OHVITD2 2.6 02/05/2018   25OHVITD3 26 02/05/2018                         Coagulation Parameters Lab Results  Component Value Date   INR 1.0 07/11/2011   LABPROT 13.4 07/11/2011   APTT 40.0 (H) 07/11/2011   PLT 225 09/18/2017                        Cardiovascular Markers Lab Results  Component Value Date   HGB 11.3 09/18/2017   HCT 36.8 09/18/2017                         CA Markers No results found.  Note: Lab results reviewed.  Recent Diagnostic Imaging Review  Lumbosacral Imaging: Lumbar DG Bending views:  Results for orders placed during the hospital encounter of 02/05/18  DG Lumbar Spine Complete W/Bend   Narrative CLINICAL DATA:  72 year old female with chronic back pain for several years. Initial encounter.  EXAM: LUMBAR SPINE - COMPLETE WITH BENDING VIEWS  COMPARISON:  07/23/2015 lumbar spine MR  FINDINGS: Prominent scoliosis lumbar spine convex right.  Moderate disc space narrowing greater on the left at the L1-2 through L3-4 level.  Moderate to marked L4-5 disc space narrowing  greater on the right. Shift of the L4 vertebra in right lateral direction.  Moderate to marked right-sided L5-S1 disc space narrowing.  No pars defect or compression fracture noted.  No abnormal motion between flexion and extension.  Vascular calcification.  IMPRESSION: 1. Scoliosis lumbar spine convex right with superimposed prominent degenerative changes  as detailed above. 2. No abnormal motion between flexion and extension. 3.  Aortic Atherosclerosis (ICD10-I70.0).   Electronically Signed   By: Genia Del M.D.   On: 02/05/2018 15:00    Sacroiliac Joint Imaging: Sacroiliac Joint DG:  Results for orders placed during the hospital encounter of 02/05/18  DG Si Joints   Narrative CLINICAL DATA:  72 year old female with chronic back pain for several years. Initial encounter.  EXAM: BILATERAL SACROILIAC JOINTS - 3+ VIEW  COMPARISON:  Lumbar spine films same date dictated separately.  FINDINGS: Minimal left-sided sacroiliac joint degenerative changes. Right sacroiliac joint is intact. Degenerative changes lumbar spine as detailed on lumbar spine exam, dictated separately.  IMPRESSION: Minimal left-sided sacroiliac joint degenerative changes.   Electronically Signed   By: Genia Del M.D.   On: 02/05/2018 15:02    Complexity Note: Imaging results reviewed. Results shared with Ms. Foor, using Layman's terms.                         Meds   Current Outpatient Medications:  .  acetaminophen (TYLENOL) 500 MG tablet, Take 500 mg by mouth every 6 (six) hours as needed for mild pain or moderate pain., Disp: , Rfl:  .  atorvastatin (LIPITOR) 10 MG tablet, Take 10 mg by mouth daily at 6 PM. , Disp: , Rfl:  .  Cholecalciferol (VITAMIN D) 2000 units CAPS, Take 2,000 Units by mouth daily. , Disp: , Rfl:  .  glipiZIDE (GLUCOTROL XL) 5 MG 24 hr tablet, TAKE 1 TABLETS BY MOUTH DAILY, Disp: 90 tablet, Rfl: 3 .  glucose blood (ONETOUCH VERIO) test strip, Use as instructed; Check blood sugar twice daily (Patient taking differently: Use as instructed; Check blood sugar twice daily), Disp: 100 each, Rfl: 12 .  [name] 50-1000 MG tablet, TAKE 1 TABLET BY MOUTH TWICE DAILY WITH A MEAL, Disp: 180 tablet, Rfl: 3 .  lisinopril-hydrochlorothiazide (PRINZIDE,ZESTORETIC) 20-12.5 MG tablet, TAKE 2 TABLETS BY MOUTH EVERY MORNING, Disp: 60 tablet,  Rfl: 12 .  metoprolol succinate (TOPROL-XL) 100 MG 24 hr tablet, Take 1 tablet (100 mg total) by mouth daily. Take with or immediately following a meal., Disp: 90 tablet, Rfl: 3 .  pioglitazone (ACTOS) 45 MG tablet, TAKE 1 TABLET(45 MG) BY MOUTH DAILY, Disp: 30 tablet, Rfl: 11 .  tamoxifen (NOLVADEX) 20 MG tablet, TAKE 1 TABLET(20 MG) BY MOUTH DAILY, Disp: 90 tablet, Rfl: 0 .  Calcium Carbonate-Vit D-Min (GNP CALCIUM 1200) 1200-1000 MG-UNIT CHEW, Chew 1,200 mg by mouth daily with breakfast. Take in combination with vitamin D and magnesium., Disp: 30 tablet, Rfl: 5 .  Cholecalciferol (VITAMIN D3) 5000 units CAPS, Take 1 capsule (5,000 Units total) by mouth daily with breakfast. Take along with calcium and magnesium., Disp: 30 capsule, Rfl: 5 .  Cyanocobalamin (VITAMIN B-12) 5000 MCG SUBL, Place 1 tablet (5,000 mcg total) under the tongue daily., Disp: 30 each, Rfl: 0 .  [START ON 03/01/2018] ergocalciferol (VITAMIN D2) 50000 units capsule, Take 1 capsule (50,000 Units total) by mouth 2 (two) times a week. X 6 weeks., Disp: 12 capsule, Rfl: 0 .  Magnesium 500 MG CAPS, Take 1 capsule (500  mg total) by mouth at bedtime., Disp: 30 capsule, Rfl: 5  ROS  Constitutional: Denies any fever or chills Gastrointestinal: No reported hemesis, hematochezia, vomiting, or acute GI distress Musculoskeletal: Denies any acute onset joint swelling, redness, loss of ROM, or weakness Neurological: No reported episodes of acute onset apraxia, aphasia, dysarthria, agnosia, amnesia, paralysis, loss of coordination, or loss of consciousness  Allergies  Ms. Lacross is allergic to gabapentin; quinine; amoxicillin; dilaudid  [hydromorphone hcl]; etodolac; hydrochlorothiazide; hydrocodone; hydromorphone; and sulfa antibiotics.  [place]  Drug: Ms. Nass  reports that she does not use drugs. Alcohol:  reports that she does not drink alcohol. Tobacco:  reports that she has never smoked. She has never used smokeless  tobacco. Medical:  has a past medical history of Anemia, Arthritis, Breast cancer ([place]) (06/28/2016), Cancer ([place]), Diabetes mellitus without complication ([place]), Hyperlipidemia, Hypertension, Hyperthyroidism, Obesity, Personal history of radiation therapy, Thyroid disease, and Vitamin D deficiency. Surgical: Ms. Weyandt  has a past surgical history that includes Knee surgery (Left); Skin cancer excision; Joint replacement (Left); Mastectomy, partial (Right, 07/18/2016); Breast lumpectomy (Right, 07/18/2016); and Breast biopsy (Right, 06/28/2016). Family: family history includes Brain cancer in her sister; Cervical cancer in her mother; Diabetes in her brother; Heart disease in her brother and father; Hypertension in her brother, father, and mother; Lung cancer in her father and sister; Stroke in her brother.  Constitutional Exam  General appearance: Well nourished, well developed, and well hydrated. In no apparent acute distress Vitals:   02/28/18 1106  BP: (!) 149/48  Pulse: (!) 51  Temp: 98.2 F (36.8 C)  SpO2: 100%  Weight: 245 lb (111.1 kg)   BMI Assessment: Estimated body mass index is 43.4 kg/m as calculated from the following:   Height as of 02/01/18: '5\' 3"'  (1.6 m).   Weight as of this encounter: 245 lb (111.1 kg).  BMI interpretation table: BMI level Category Range association with higher incidence of chronic pain  <18 kg/m2 Underweight   18.5-24.9 kg/m2 Ideal body weight   25-29.9 kg/m2 Overweight Increased incidence by 20%  30-34.9 kg/m2 Obese (Class I) Increased incidence by 68%  35-39.9 kg/m2 Severe obesity (Class II) Increased incidence by 136%  >40 kg/m2 Extreme obesity (Class III) Increased incidence by 254%   Patient's current BMI Ideal Body weight  Body mass index is 43.4 kg/m. Ideal body weight: 52.4 kg (115 lb 8.3 oz) Adjusted ideal body weight: 75.9 kg (167 lb 5 oz)   BMI Readings from Last 4 Encounters:  02/28/18 43.40 kg/m  02/01/18 43.40 kg/m  01/08/18  43.58 kg/m  12/19/17 43.58 kg/m   Wt Readings from Last 4 Encounters:  02/28/18 245 lb (111.1 kg)  02/01/18 245 lb (111.1 kg)  01/08/18 246 lb (111.6 kg)  12/19/17 246 lb (111.6 kg)  Psych/Mental status: Alert, oriented x 3 (person, place, & time)       Eyes: PERLA Respiratory: No evidence of acute respiratory distress  Cervical Spine Area Exam  Skin & Axial Inspection: No masses, redness, edema, swelling, or associated skin lesions Alignment: Symmetrical Functional ROM: Unrestricted ROM      Stability: No instability detected Muscle Tone/Strength: Functionally intact. No obvious neuro-muscular anomalies detected. Sensory (Neurological): Unimpaired Palpation: No palpable anomalies              Upper Extremity (UE) Exam    Side: Right upper extremity  Side: Left upper extremity  Skin & Extremity Inspection: Skin color, temperature, and hair growth are WNL. No peripheral edema or cyanosis. No  masses, redness, swelling, asymmetry, or associated skin lesions. No contractures.  Skin & Extremity Inspection: Skin color, temperature, and hair growth are WNL. No peripheral edema or cyanosis. No masses, redness, swelling, asymmetry, or associated skin lesions. No contractures.  Functional ROM: Unrestricted ROM          Functional ROM: Unrestricted ROM          Muscle Tone/Strength: Functionally intact. No obvious neuro-muscular anomalies detected.  Muscle Tone/Strength: Functionally intact. No obvious neuro-muscular anomalies detected.  Sensory (Neurological): Unimpaired          Sensory (Neurological): Unimpaired          Palpation: No palpable anomalies              Palpation: No palpable anomalies              Provocative Test(s):  Phalen's test: deferred Tinel's test: deferred Apley's scratch test (touch opposite shoulder):  Action 1 (Across chest): deferred Action 2 (Overhead): deferred Action 3 (LB reach): deferred   Provocative Test(s):  Phalen's test: deferred Tinel's test:  deferred Apley's scratch test (touch opposite shoulder):  Action 1 (Across chest): deferred Action 2 (Overhead): deferred Action 3 (LB reach): deferred    Thoracic Spine Area Exam  Skin & Axial Inspection: No masses, redness, or swelling Alignment: Symmetrical Functional ROM: Unrestricted ROM Stability: No instability detected Muscle Tone/Strength: Functionally intact. No obvious neuro-muscular anomalies detected. Sensory (Neurological): Unimpaired Muscle strength & Tone: No palpable anomalies  Lumbar Spine Area Exam  Skin & Axial Inspection: No masses, redness, or swelling Alignment: Symmetrical Functional ROM: Unrestricted ROM       Stability: No instability detected Muscle Tone/Strength: Functionally intact. No obvious neuro-muscular anomalies detected. Sensory (Neurological): Unimpaired Palpation: No palpable anomalies       Provocative Tests: Hyperextension/rotation test: deferred today       Lumbar quadrant test (Kemp's test): deferred today       Lateral bending test: deferred today       Patrick's Maneuver: deferred today                   FABER test: deferred today                   S-I anterior distraction/compression test: deferred today         S-I lateral compression test: deferred today         S-I Thigh-thrust test: deferred today         S-I Gaenslen's test: deferred today          Gait & Posture Assessment  Ambulation: Patient came in today in a wheel chair Gait: Significantly limited. Dependent on assistive device to ambulate Posture: Antalgic   Lower Extremity Exam    Side: Right lower extremity  Side: Left lower extremity  Stability: No instability observed          Stability: No instability observed          Skin & Extremity Inspection: Skin color, temperature, and hair growth are WNL. No peripheral edema or cyanosis. No masses, redness, swelling, asymmetry, or associated skin lesions. No contractures.  Skin & Extremity Inspection: Skin color,  temperature, and hair growth are WNL. No peripheral edema or cyanosis. No masses, redness, swelling, asymmetry, or associated skin lesions. No contractures.  Functional ROM: Unrestricted ROM                  Functional ROM: Unrestricted ROM  Muscle Tone/Strength: Functionally intact. No obvious neuro-muscular anomalies detected.  Muscle Tone/Strength: Functionally intact. No obvious neuro-muscular anomalies detected.  Sensory (Neurological): Unimpaired  Sensory (Neurological): Unimpaired  Palpation: No palpable anomalies  Palpation: No palpable anomalies   Assessment & Plan  Primary Diagnosis & Pertinent Problem List: The primary encounter diagnosis was Chronic pain syndrome. 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  Visit Diagnosis: 1. Chronic pain syndrome   2. Chronic low back pain (Primary Area of Pain) (Bilateral) w/ sciatica (Left)   3. DDD (degenerative disc disease), lumbar   4. Lumbar facet hypertrophy   5. Lumbar facet arthropathy (Bilateral)   6. Lumbar facet syndrome (Bilateral)   7. Lumbar spondylosis   8. Lumbar intervertebral  disc protrusion   9. Chronic sacroiliac joint pain (Left)   10. Osteoarthritis of sacroiliac joint (Left)   11. Sacroiliac joint dysfunction (Left)   12. Somatic dysfunction of sacroiliac joint (Left)   13. Chronic lower extremity pain (Secondary Area of Pain) (Left)   14. 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 Lumbar nerve root compression   16. Disorder of skeletal system   17. Pharmacologic therapy   18. Problems influencing health status   19. Morbid obesity with BMI of 40.0-44.9, adult ([place])   20. Vitamin B 12 deficiency   21. Hypomagnesemia   22. Vitamin D insufficiency   23. CKD (chronic kidney disease) stage 3, GFR 30-59 ml/min ([name])    Problems updated and reviewed during this visit: Problem  Ddd (Degenerative Disc Disease), Lumbar  Osteoarthritis of sacroiliac joint (Left)  Somatic dysfunction of sacroiliac joint (Left)  Sacroiliac joint dysfunction (Left)  Abnormal MRI, lumbar spine (07/22/2013)   FINDINGS:  Five lumbar type vertebral bodies are assumed. The alignment is near anatomic. There are mild endplate degenerative changes at L4-5 and L5-S1. There is no evidence of acute fracture or pars defect. 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   The conus medullaris extends to the level and appears normal. No acute paraspinal abnormalities are identified. There is atrophy of the erector spinae musculature. Small calcified gallstone noted.   L1-2: There is loss of disc height with annular disc bulging and a left paracentral disc extrusion. There is caudal migration of a small disc fragment behind the L2 vertebral body. No mass effect on the distal cord or high-grade foraminal stenosis results.  L2-3: There is disc bulging with a small posterolateral disc protrusion on the left, possibly encroaching on the left L3 nerve root in the lateral recess. There is no significant foraminal stenosis or exiting L2 nerve root encroachment.  L3-4: There is disc bulging  with a broad-based extraforaminal disc protrusion on the right. There is mild facet and ligamentous hypertrophy. These factors contribute to mild triangulation of the thecal sac and possible extraforaminal right L3 nerve root encroachment. The left foramen  is patent.  L4-5: Disc bulging and osteophytes are eccentric to the left. There is advanced facet disease with bilateral facet joint effusions. These factors contribute to moderate spinal stenosis with moderate asymmetric left foraminal stenosis.  L5-S1: Disc bulging, osteophytes and facet hypertrophy are asymmetric to the right. Osteophytes could encroach on the extraforaminal portion of the right L5 nerve root. There is no significant foraminal, central or lateral recess stenosis.   IMPRESSION:  1. 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 There is moderate multifactorial spinal stenosis at L4-5 with asymmetric left-sided foraminal stenosis.  2. No other significant central stenosis demonstrated.  3. Multiple small disc herniations, including a caudally migrated left paracentral disc extrusion at L1-2, a small posterolateral disc protrusion on the left at L2-3 and a broad-based extraforaminal disc protrusion on the right at L3-4.  4. Facet disease with facet joint effusions inferiorly.    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 L4-5: There is advanced facet disease with bilateral facet joint effusions. L5-S1: facet hypertrophy asymmetric to the right.   Lumbar intervertebral disc protrusion   L1-2: There is left paracentral disc extrusion. There is caudal migration of a small disc fragment behind the L2 vertebral body.  L2-3: There is disc bulging with a small posterolateral disc protrusion on the left, possibly encroaching on the left L3 nerve root in the lateral  recess.  L3-4: There is disc bulging with a broad-based extraforaminal disc protrusion on the right  and possible extraforaminal right L3 nerve root encroachment.  L4-5: Disc bulging and osteophytes are eccentric to the left.  L5-S1: Disc bulging   Lumbar nerve root compression   L2-3: possible encroachment on the left L3 nerve root in the lateral recess. L3-4: possible encroachment on the extraforaminal right L3 nerve root encroachment. L5-S1: possible encroachment on the extraforaminal portion of the right L5 nerve root.   Chronic sacroiliac joint pain (Left)  Lumbar facet hypertrophy    Plan of Care  Pharmacotherapy (Medications Ordered): Meds ordered this encounter  Medications  . Cyanocobalamin (VITAMIN B-12) 5000 MCG SUBL    Sig: Place 1 tablet (5,000 mcg total) under the tongue daily.    Dispense:  30 each    Refill:  0    Do not place medication on "Automatic Refill". Fill one day early if pharmacy is closed on scheduled refill date.  . ergocalciferol (VITAMIN D2) 50000 units capsule    Sig: Take 1 capsule (50,000 Units total) by mouth 2 (two) times a week. X 6 weeks.    Dispense:  12 capsule    Refill:  0    Do not add this medication to the electronic "Automatic Refill" notification system. Patient may have prescription filled one day early if pharmacy is closed on scheduled refill date.  . Cholecalciferol (VITAMIN D3) 5000 units CAPS    Sig: Take 1 capsule (5,000 Units total) by mouth daily with breakfast. Take along with calcium and magnesium.    Dispense:  30 capsule    Refill:  5    Do not place medication on "Automatic Refill".  May substitute with similar over-the-counter product.  . Magnesium 500 MG CAPS    Sig: Take 1 capsule (500 mg total) by mouth at bedtime.    Dispense:  30 capsule    Refill:  5    Do not place medication on "Automatic Refill".  The patient may use similar over-the-counter product.  . Calcium Carbonate-Vit D-Min (GNP CALCIUM 1200) 1200-1000  MG-UNIT CHEW    Sig: Chew 1,200 mg by mouth daily with breakfast. Take in combination with vitamin D and magnesium.    Dispense:  30 tablet    Refill:  5    Do not place medication on "Automatic Refill".  May substitute with similar over-the-counter product.    Procedure Orders     LUMBAR FACET(MEDIAL BRANCH NERVE BLOCK) [facility] Lab Orders  No laboratory test(s) ordered today   Imaging Orders  No imaging studies ordered today    Referral Orders     Amb Ref to Medical Weight Management     Amb Referral to Bariatric Surgery  Pharmacological management options:  Opioid Analgesics: We'll take over management today. See above orders Membrane stabilizer: We have discussed the possibility of optimizing this mode of therapy, if tolerated Muscle relaxant: We have discussed the possibility of a trial NSAID: We have discussed the possibility of a trial Other analgesic(s): To be determined at a later time   Interventional management options: Planned, scheduled, and/or pending:    PRN Diagnostic bilateral lumbar facet block #1 under fluoroscopic guidance and IV sedation.   Considering:   Diagnostic bilateral lumbar facet nerve block  Possible bilateral lumbar facet RFA  Diagnostic bilateral sacroiliac joint block  Possible bilateral sacroiliac joint RFA  Diagnostic bilateral L3 transforaminal ESI  Diagnostic right-sided L5 transforaminal ESI  Diagnostic left-sided L1-2 interlaminar LESI  Diagnostic left-sided L2-3 interlaminar LESI  Diagnostic right-sided L3-4 interlaminar LESI  Diagnostic left-sided L4-5 interlaminar LESI    PRN Procedures:   None at this time   Provider-requested follow-up: Return for PRN Procedure (w/ sedation): (B) L-FCT BLK #1.  Future Appointments  Date Time Provider [place]  04/05/2018 11:00 AM Lloyd Huger, MD [facility] None  05/10/2018 10:40 AM Jerrol Banana., MD [facility] None  09/20/2018 10:00 AM [facility] HEALTH ADVISOR [facility]  None  09/20/2018 10:40 AM Jerrol Banana., MD [facility] None  11/21/2018  9:30 AM Noreene Filbert, MD [facility] None    Primary Care Physician: Jerrol Banana., MD Location: [facility] Note by: Gaspar Cola, MD Date: 02/28/2018; Time: 1:27 PM

## 2018-02-27 DIAGNOSIS — E8809 Other disorders of plasma-protein metabolism, not elsewhere classified: Secondary | ICD-10-CM | POA: Insufficient documentation

## 2018-02-27 DIAGNOSIS — Z6841 Body Mass Index (BMI) 40.0 and over, adult: Secondary | ICD-10-CM

## 2018-02-27 DIAGNOSIS — E538 Deficiency of other specified B group vitamins: Secondary | ICD-10-CM | POA: Insufficient documentation

## 2018-02-28 ENCOUNTER — Other Ambulatory Visit: Payer: Self-pay

## 2018-02-28 ENCOUNTER — Encounter: Payer: Self-pay | Admitting: Pain Medicine

## 2018-02-28 ENCOUNTER — Ambulatory Visit: Payer: Medicare Other | Attending: Pain Medicine | Admitting: Pain Medicine

## 2018-02-28 VITALS — BP 149/48 | HR 51 | Temp 98.2°F | Wt 245.0 lb

## 2018-02-28 DIAGNOSIS — Z5181 Encounter for therapeutic drug level monitoring: Secondary | ICD-10-CM | POA: Insufficient documentation

## 2018-02-28 DIAGNOSIS — G894 Chronic pain syndrome: Secondary | ICD-10-CM

## 2018-02-28 DIAGNOSIS — M533 Sacrococcygeal disorders, not elsewhere classified: Secondary | ICD-10-CM

## 2018-02-28 DIAGNOSIS — E539 Vitamin B deficiency, unspecified: Secondary | ICD-10-CM | POA: Insufficient documentation

## 2018-02-28 DIAGNOSIS — M48061 Spinal stenosis, lumbar region without neurogenic claudication: Secondary | ICD-10-CM

## 2018-02-28 DIAGNOSIS — M5442 Lumbago with sciatica, left side: Secondary | ICD-10-CM | POA: Diagnosis not present

## 2018-02-28 DIAGNOSIS — M79605 Pain in left leg: Secondary | ICD-10-CM | POA: Diagnosis not present

## 2018-02-28 DIAGNOSIS — M47816 Spondylosis without myelopathy or radiculopathy, lumbar region: Secondary | ICD-10-CM | POA: Insufficient documentation

## 2018-02-28 DIAGNOSIS — M899 Disorder of bone, unspecified: Secondary | ICD-10-CM

## 2018-02-28 DIAGNOSIS — E559 Vitamin D deficiency, unspecified: Secondary | ICD-10-CM

## 2018-02-28 DIAGNOSIS — M47818 Spondylosis without myelopathy or radiculopathy, sacral and sacrococcygeal region: Secondary | ICD-10-CM

## 2018-02-28 DIAGNOSIS — N183 Chronic kidney disease, stage 3 unspecified: Secondary | ICD-10-CM

## 2018-02-28 DIAGNOSIS — Z6841 Body Mass Index (BMI) 40.0 and over, adult: Secondary | ICD-10-CM | POA: Diagnosis not present

## 2018-02-28 DIAGNOSIS — M5126 Other intervertebral disc displacement, lumbar region: Secondary | ICD-10-CM | POA: Diagnosis not present

## 2018-02-28 DIAGNOSIS — M51369 Other intervertebral disc degeneration, lumbar region without mention of lumbar back pain or lower extremity pain: Secondary | ICD-10-CM

## 2018-02-28 DIAGNOSIS — M5388 Other specified dorsopathies, sacral and sacrococcygeal region: Secondary | ICD-10-CM | POA: Diagnosis not present

## 2018-02-28 DIAGNOSIS — Z789 Other specified health status: Secondary | ICD-10-CM

## 2018-02-28 DIAGNOSIS — M9904 Segmental and somatic dysfunction of sacral region: Secondary | ICD-10-CM | POA: Diagnosis not present

## 2018-02-28 DIAGNOSIS — M461 Sacroiliitis, not elsewhere classified: Secondary | ICD-10-CM

## 2018-02-28 DIAGNOSIS — R937 Abnormal findings on diagnostic imaging of other parts of musculoskeletal system: Secondary | ICD-10-CM | POA: Insufficient documentation

## 2018-02-28 DIAGNOSIS — M5416 Radiculopathy, lumbar region: Secondary | ICD-10-CM | POA: Diagnosis not present

## 2018-02-28 DIAGNOSIS — G8929 Other chronic pain: Secondary | ICD-10-CM

## 2018-02-28 DIAGNOSIS — M5136 Other intervertebral disc degeneration, lumbar region: Secondary | ICD-10-CM | POA: Insufficient documentation

## 2018-02-28 DIAGNOSIS — E538 Deficiency of other specified B group vitamins: Secondary | ICD-10-CM

## 2018-02-28 DIAGNOSIS — Z79899 Other long term (current) drug therapy: Secondary | ICD-10-CM

## 2018-02-28 MED ORDER — MAGNESIUM 500 MG PO CAPS: 500.0000 mg | ORAL_CAPSULE | Freq: Every day | ORAL | 5 refills | Status: DC

## 2018-02-28 MED ORDER — VITAMIN B-12 5000 MCG SL SUBL: 5000.0000 ug | SUBLINGUAL_TABLET | Freq: Every day | SUBLINGUAL | 0 refills | Status: AC

## 2018-02-28 MED ORDER — GNP CALCIUM 1200 1200-1000 MG-UNIT PO CHEW: 1200.0000 mg | CHEWABLE_TABLET | Freq: Every day | ORAL | 5 refills | Status: DC

## 2018-02-28 MED ORDER — VITAMIN D3 125 MCG (5000 UT) PO CAPS: 1.0000 | ORAL_CAPSULE | Freq: Every day | ORAL | 5 refills | Status: AC

## 2018-02-28 MED ORDER — ERGOCALCIFEROL 1.25 MG (50000 UT) PO CAPS: 50000.0000 [IU] | ORAL_CAPSULE | ORAL | 0 refills | Status: AC

## 2018-02-28 NOTE — Patient Instructions (Addendum)
____________________________________________________________________________________________  Pain Scale  Introduction: The pain score used by this practice is the Verbal Numerical Rating Scale (VNRS-11). This is an 11-point scale. It is for adults and children 10 years or older. There are significant differences in how the pain score is reported, used, and applied. Forget everything you learned in the past and learn this scoring system.  General Information: The scale should reflect your current level of pain. Unless you are specifically asked for the level of your worst pain, or your average pain. If you are asked for one of these two, then it should be understood that it is over the past 24 hours.  Basic Activities of Daily Living (ADL): Personal hygiene, dressing, eating, transferring, and using restroom.  Instructions: Most patients tend to report their level of pain as a combination of two factors, their physical pain and their psychosocial pain. This last one is also known as "suffering" and it is reflection of how physical pain affects you socially and psychologically. From now on, report them separately. From this point on, when asked to report your pain level, report only your physical pain. Use the following table for reference.  Pain Clinic Pain Levels (0-5/10)  Pain Level Score  Description  No Pain 0   Mild pain 1 Nagging, annoying, but does not interfere with basic activities of daily living (ADL). Patients are able to eat, bathe, get dressed, toileting (being able to get on and off the toilet and perform personal hygiene functions), transfer (move in and out of bed or a chair without assistance), and maintain continence (able to control bladder and bowel functions). Blood pressure and heart rate are unaffected. A normal heart rate for a healthy adult ranges from 60 to 100 bpm (beats per minute).   Mild to moderate pain 2 Noticeable and distracting. Impossible to hide from other  people. More frequent flare-ups. Still possible to adapt and function close to normal. It can be very annoying and may have occasional stronger flare-ups. With discipline, patients may get used to it and adapt.   Moderate pain 3 Interferes significantly with activities of daily living (ADL). It becomes difficult to feed, bathe, get dressed, get on and off the toilet or to perform personal hygiene functions. Difficult to get in and out of bed or a chair without assistance. Very distracting. With effort, it can be ignored when deeply involved in activities.   Moderately severe pain 4 Impossible to ignore for more than a few minutes. With effort, patients may still be able to manage work or participate in some social activities. Very difficult to concentrate. Signs of autonomic nervous system discharge are evident: dilated pupils (mydriasis); mild sweating (diaphoresis); sleep interference. Heart rate becomes elevated (>115 bpm). Diastolic blood pressure (lower number) rises above 100 mmHg. Patients find relief in laying down and not moving.   Severe pain 5 Intense and extremely unpleasant. Associated with frowning face and frequent crying. Pain overwhelms the senses.  Ability to do any activity or maintain social relationships becomes significantly limited. Conversation becomes difficult. Pacing back and forth is common, as getting into a comfortable position is nearly impossible. Pain wakes you up from deep sleep. Physical signs will be obvious: pupillary dilation; increased sweating; goosebumps; brisk reflexes; cold, clammy hands and feet; nausea, vomiting or dry heaves; loss of appetite; significant sleep disturbance with inability to fall asleep or to remain asleep. When persistent, significant weight loss is observed due to the complete loss of appetite and sleep deprivation.  Blood   pressure and heart rate becomes significantly elevated. Caution: If elevated blood pressure triggers a pounding headache  associated with blurred vision, then the patient should immediately seek attention at an urgent or emergency care unit, as these may be signs of an impending stroke.    Emergency Department Pain Levels (6-10/10)  Emergency Room Pain 6 Severely limiting. Requires emergency care and should not be seen or managed at an outpatient pain management facility. Communication becomes difficult and requires great effort. Assistance to reach the emergency department may be required. Facial flushing and profuse sweating along with potentially dangerous increases in heart rate and blood pressure will be evident.   Distressing pain 7 Self-care is very difficult. Assistance is required to transport, or use restroom. Assistance to reach the emergency department will be required. Tasks requiring coordination, such as bathing and getting dressed become very difficult.   Disabling pain 8 Self-care is no longer possible. At this level, pain is disabling. The individual is unable to do even the most "basic" activities such as walking, eating, bathing, dressing, transferring to a bed, or toileting. Fine motor skills are lost. It is difficult to think clearly.   Incapacitating pain 9 Pain becomes incapacitating. Thought processing is no longer possible. Difficult to remember your own name. Control of movement and coordination are lost.   The worst pain imaginable 10 At this level, most patients pass out from pain. When this level is reached, collapse of the autonomic nervous system occurs, leading to a sudden drop in blood pressure and heart rate. This in turn results in a temporary and dramatic drop in blood flow to the brain, leading to a loss of consciousness. Fainting is one of the body's self defense mechanisms. Passing out puts the brain in a calmed state and causes it to shut down for a while, in order to begin the healing process.    Summary: 1. Refer to this scale when providing [place] with your pain level. 2. Be  accurate and careful when reporting your pain level. This will help with your care. 3. Over-reporting your pain level will lead to loss of credibility. 4. Even a level of 1/10 means that there is pain and will be treated at our facility. 5. High, inaccurate reporting will be documented as "Symptom Exaggeration", leading to loss of credibility and suspicions of possible secondary gains such as obtaining more narcotics, or wanting to appear disabled, for fraudulent reasons. 6. Only pain levels of 5 or below will be seen at our facility. 7. Pain levels of 6 and above will be sent to the Emergency Department and the appointment cancelled. ____________________________________________________________________________________________  ____________________________________________________________________________________________  Weight Management Required  URGENT: Your weight has been found to be adversely affecting your health.  Dear Carol Ramsey:  Your current Body mass index is 43.4 kg/m.[name] Estimated body mass index is 43.4 kg/m as calculated from the following:   Height as of 02/01/18: 5\' 3"  (1.6 m).   Weight as of this encounter: 245 lb (111.1 kg).  Your last four (4) weight and BMI calculations are as follows: Wt Readings from Last 4 Encounters:  02/28/18 245 lb (111.1 kg)  02/01/18 245 lb (111.1 kg)  01/08/18 246 lb (111.6 kg)  12/19/17 246 lb (111.6 kg)   BMI Readings from Last 4 Encounters:  02/28/18 43.40 kg/m  02/01/18 43.40 kg/m  01/08/18 43.58 kg/m  12/19/17 43.58 kg/m    Calculations estimate your ideal body weight to be: Ideal body weight: 52.4 kg (115 lb 8.3 oz)  Adjusted ideal body weight: 75.9 kg (167 lb 5 oz)  Please use the table below to identify your weight category and associated incidence of chronic pain, secondary to your weight.  BMI interpretation table: BMI level Category Associated incidence of chronic pain  <18 kg/m2 Underweight   18.5-24.9 kg/m2 Ideal  body weight   25-29.9 kg/m2 Overweight  20%  30-34.9 kg/m2 Obese (Class I)  68%  35-39.9 kg/m2 Severe obesity (Class II)  136%  >40 kg/m2 Extreme obesity (Class III)  254%   In addition: You will be considered "Morbidly Obese", if your BMI is above 30 and you have one or more of the following conditions that are directly associated with obesity: 1. Type 2 Diabetes (Which in turn can lead to cardiovascular diseases (CVD), stroke, peripheral vascular diseases (PVD), retinopathy, nephropathy, and neuropathy) 2. Cardiovascular Disease  3. Breathing problems (Asthma, obesity-hypoventilation syndrome, obstructive sleep apnea, chronic inflammatory airway disease) 4. Chronic kidney disease 5. Liver disease (nonalcoholic fatty liver disease) 6. High blood pressure 7. Acid reflux (gastroesophageal reflux disease) 8. Osteoarthritis (OA) 9. Low back pain (Lumbar Facet Syndrome) 10. Hip pain (Osteoarthritis of hip) 11. Knee pain (Osteoarthritis of knee) (patients with a BMI>30 kg/m2 were 6.8 times more likely to develop knee OA than normal-weight individuals) 12. Certain types of cancer. (Epidemiological studies have shown that obesity is a risk factor for: post-menopausal breast cancer; cancers of the endometrium, colon and kidney; malignant adenomas of the oesophagus. Obese subjects have an approximately 1.5-3.5-fold increased risk of developing these cancers compared with normal-weight subjects, and it has been estimated that between 15 and 45% of these cancers can be attributed to overweight. More recent studies suggest that obesity may also increase the risk of other types of cancer, including pancreatic, hepatic and gallbladder cancer. Ref: Obesity and cancer. Pischon T, Nthlings U, [employer] H. Proc Nutr Soc. 2008 May;67(2):128-45. doi: 31.5400/Q6761950932671245.)  Recommendation: At this point it is urgent that you take a step back and concentrate in loosing weight. Because most chronic pain patients do  have difficulty exercising secondary to their pain, you must rely on proper nutrition and dieting in order to lose the weight. If your BMI is above 40, you should seriously consider bariatric surgery. A realistic goal is to lose 10% of your body weight over a period of 12 months.  If over time you have unsuccessfully try to lose weight, then it is time for you to seek professional help and to enter a medically supervised weight management program.  Pain management considerations:  1. Pharmacological Problems: Be advised that the use of opioid analgesics has been associated with decreased metabolism and weight gain.  For this reason, should we see that you are unable to lose weight while taking these medications, it may become necessary for [place] to taper down and indefinitely discontinue these medicines.  2. Technical Problems: The incidence of successful interventional therapies decreases as the patient's BMI increases. It is much more difficult to accomplish a safe and effective interventional therapy on a patient with a BMI above 35. Yours is Body mass index is 43.4 kg/m.[name] 3. Radiation Exposure Problems: The x-rays machine, used to accomplish injection therapies, will automatically increase their x-ray output in order to capture an appropriate bone image. This means that radiation exposure increases exponentially with the patient's BMI. (The higher the BMI, the higher the radiation exposure.) Although the level of radiation used at a given time is still safe to the patient, it is not for the physician and/or  assisting staff. Unfortunately, radiation exposure is accumulative. Because physicians and the staff have to do procedures and be exposed on a daily basis, this can result in health problems such as cancer and radiation burns. Radiation exposure to the staff is monitored by the radiation batches that they wear. The exposure levels are reported back to the staff on a quarterly basis. Depending on levels of  exposure, physicians and staff may be obligated by law to decrease this exposure. This means that they have the right and obligation to refuse providing therapies where they may be overexposed to radiation. For this reason, physicians may decline to offer therapies such as radiofrequency ablation or implants to patients with a BMI above 40. ____________________________________________________________________________________________  ____________________________________________________________________________________________  General Risks and Possible Complications  Patient Responsibilities: It is important that you read this as it is part of your informed consent. It is our duty to inform you of the risks and possible complications associated with treatments offered to you. It is your responsibility as a patient to read this and to ask questions about anything that is not clear or that you believe was not covered in this document.  Patient's Rights: You have the right to refuse treatment. You also have the right to change your mind, even after initially having agreed to have the treatment done. However, under this last option, if you wait until the last second to change your mind, you may be charged for the materials used up to that point.  Introduction: Medicine is not an Chief Strategy Officer. Everything in Medicine, including the lack of treatment(s), carries the potential for danger, harm, or loss (which is by definition: Risk). In Medicine, a complication is a secondary problem, condition, or disease that can aggravate an already existing one. All treatments carry the risk of possible complications. The fact that a side effects or complications occurs, does not imply that the treatment was conducted incorrectly. It must be clearly understood that these can happen even when everything is done following the highest safety standards.  No treatment: You can choose not to proceed with the proposed treatment  alternative. The "PRO(s)" would include: avoiding the risk of complications associated with the therapy. The "CON(s)" would include: not getting any of the treatment benefits. These benefits fall under one of three categories: diagnostic; therapeutic; and/or palliative. Diagnostic benefits include: getting information which can ultimately lead to improvement of the disease or symptom(s). Therapeutic benefits are those associated with the successful treatment of the disease. Finally, palliative benefits are those related to the decrease of the primary symptoms, without necessarily curing the condition (example: decreasing the pain from a flare-up of a chronic condition, such as incurable terminal cancer).  General Risks and Complications: These are associated to most interventional treatments. They can occur alone, or in combination. They fall under one of the following six (6) categories: no benefit or worsening of symptoms; bleeding; infection; nerve damage; allergic reactions; and/or death. 1. No benefits or worsening of symptoms: In Medicine there are no guarantees, only probabilities. No healthcare provider can ever guarantee that a medical treatment will work, they can only state the probability that it may. Furthermore, there is always the possibility that the condition may worsen, either directly, or indirectly, as a consequence of the treatment. 2. Bleeding: This is more common if the patient is taking a blood thinner, either prescription or over the counter (example: Goody Powders, Fish oil, Aspirin, Garlic, etc.), or if suffering a condition associated with impaired coagulation (example: Hemophilia, cirrhosis  of the liver, low platelet counts, etc.). However, even if you do not have one on these, it can still happen. If you have any of these conditions, or take one of these drugs, make sure to notify your treating physician. 3. Infection: This is more common in patients with a compromised immune  system, either due to disease (example: diabetes, cancer, human immunodeficiency virus [HIV], etc.), or due to medications or treatments (example: therapies used to treat cancer and rheumatological diseases). However, even if you do not have one on these, it can still happen. If you have any of these conditions, or take one of these drugs, make sure to notify your treating physician. 4. Nerve Damage: This is more common when the treatment is an invasive one, but it can also happen with the use of medications, such as those used in the treatment of cancer. The damage can occur to small secondary nerves, or to large primary ones, such as those in the spinal cord and brain. This damage may be temporary or permanent and it may lead to impairments that can range from temporary numbness to permanent paralysis and/or brain death. 5. Allergic Reactions: Any time a substance or material comes in contact with our body, there is the possibility of an allergic reaction. These can range from a mild skin rash (contact dermatitis) to a severe systemic reaction (anaphylactic reaction), which can result in death. 6. Death: In general, any medical intervention can result in death, most of the time due to an unforeseen complication. ____________________________________________________________________________________________   ____________________________________________________________________________________________  Preparing for Procedure with Sedation  Instructions: . Oral Intake: Do not eat or drink anything for at least 8 hours prior to your procedure. . Transportation: Public transportation is not allowed. Bring an adult driver. The driver must be physically present in our waiting room before any procedure can be started. [name] Physical Assistance: Bring an adult physically capable of assisting you, in the event you need help. This adult should keep you company at home for at least 6 hours after the  procedure. . Blood Pressure Medicine: Take your blood pressure medicine with a sip of water the morning of the procedure. . Blood thinners: Notify our staff if you are taking any blood thinners. Depending on which one you take, there will be specific instructions on how and when to stop it. . Diabetics on insulin: Notify the staff so that you can be scheduled 1st case in the morning. If your diabetes requires high dose insulin, take only  of your normal insulin dose the morning of the procedure and notify the staff that you have done so. . Preventing infections: Shower with an antibacterial soap the morning of your procedure. . Build-up your immune system: Take 1000 mg of Vitamin C with every meal (3 times a day) the day prior to your procedure. [name] Antibiotics: Inform the staff if you have a condition or reason that requires you to take antibiotics before dental procedures. . Pregnancy: If you are pregnant, call and cancel the procedure. . Sickness: If you have a cold, fever, or any active infections, call and cancel the procedure. . Arrival: You must be in the facility at least 30 minutes prior to your scheduled procedure. . Children: Do not bring children with you. . Dress appropriately: Bring dark clothing that you would not mind if they get stained. . Valuables: Do not bring any jewelry or valuables.  Procedure appointments are reserved for interventional treatments only. [name] No Prescription Refills. . No medication  changes will be discussed during procedure appointments. . No disability issues will be discussed.  Reasons to call and reschedule or cancel your procedure: (Following these recommendations will minimize the risk of a serious complication.) . Surgeries: Avoid having procedures within 2 weeks of any surgery. (Avoid for 2 weeks before or after any surgery). . Flu Shots: Avoid having procedures within 2 weeks of a flu shots or . (Avoid for 2 weeks before or after  immunizations). . Barium: Avoid having a procedure within 7-10 days after having had a radiological study involving the use of radiological contrast. (Myelograms, Barium swallow or enema study). . Heart attacks: Avoid any elective procedures or surgeries for the initial 6 months after a "Myocardial Infarction" (Heart Attack). . Blood thinners: It is imperative that you stop these medications before procedures. Let us know if you if you take any blood thinner.  . Infection: Avoid procedures during or within two weeks of an infection (including chest colds or gastrointestinal problems). Symptoms associated with infections include: Localized redness, fever, chills, night sweats or profuse sweating, burning sensation when voiding, cough, congestion, stuffiness, runny nose, sore throat, diarrhea, nausea, vomiting, cold or Flu symptoms, recent or current infections. It is specially important if the infection is over the area that we intend to treat. [name] Heart and lung problems: Symptoms that may suggest an active cardiopulmonary problem include: cough, chest pain, breathing difficulties or shortness of breath, dizziness, ankle swelling, uncontrolled high or unusually low blood pressure, and/or palpitations. If you are experiencing any of these symptoms, cancel your procedure and contact your primary care physician for an evaluation.  Remember:  Regular Business hours are:  Monday to Thursday 8:00 AM to 4:00 PM  Provider's Schedule: Milinda Pointer, MD:  Procedure days: Tuesday and Thursday 7:30 AM to 4:00 PM  Gillis Santa, MD:  Procedure days: Monday and Wednesday 7:30 AM to 4:00 PM ____________________________________________________________________________________________

## 2018-03-01 ENCOUNTER — Telehealth: Payer: Self-pay | Admitting: Family Medicine

## 2018-03-01 NOTE — Telephone Encounter (Signed)
Advised  ED 

## 2018-03-01 NOTE — Telephone Encounter (Signed)
Pt states she was put on several diffrent medication by pain mgmt clinic and wanted to speak to Dr. Rosanna Randy or nurse about the medication before she gets them filled today.    Medications suggested: Magnesium  V B 12 V D  V D3 Calcium  V D2

## 2018-03-01 NOTE — Telephone Encounter (Signed)
Patient reports that pain management would like her to start supplements as below. However, patient wants to know should she take Calcium being that her levels were normal? Please advise. Thanks!

## 2018-03-01 NOTE — Telephone Encounter (Signed)
Ok to take these .

## 2018-03-30 ENCOUNTER — Encounter: Payer: Self-pay | Admitting: Family Medicine

## 2018-03-30 ENCOUNTER — Ambulatory Visit (INDEPENDENT_AMBULATORY_CARE_PROVIDER_SITE_OTHER): Payer: Medicare Other | Admitting: Family Medicine

## 2018-03-30 VITALS — BP 132/78 | HR 68 | Temp 99.1°F | Resp 20 | Wt 262.0 lb

## 2018-03-30 DIAGNOSIS — S80812A Abrasion, left lower leg, initial encounter: Secondary | ICD-10-CM

## 2018-03-30 DIAGNOSIS — J069 Acute upper respiratory infection, unspecified: Secondary | ICD-10-CM

## 2018-03-30 NOTE — Progress Notes (Signed)
  Subjective:     Patient ID: Carol Ramsey, female   DOB: May 24, 1945, 72 y.o.   MRN: [id number] Chief Complaint  Patient presents with  . Cough    Patient comes in c/o cough, congestion, and headaches X 1 week. She also has runny nose, watery eyes with occasional burning, and PND. She has taken OTC allergy meds with no change. She denies fever. Reports congestion is mainly in her head.   . Cyst    Patient also c/o cyst on the back of her lower left leg that is draining clear fluids. She reports that it itches occasionally.    HPI Reports cold sx witht persistent clear sinus drainage with PND provoking cough.   Review of Systems     Objective:   Physical Exam  Constitutional: She appears well-developed and well-nourished. No distress.  Skin:  Left posterior leg with a 0.5 cm erosion with clear drainage. Band-aid placed to prevent further manipulation.  Ears: T.M's intact without inflammation Throat: no tonsillar enlargement or exudate Neck: no cervical adenopathy Lungs: clear     Assessment:    1. Excoriation of lower leg, left, initial encounter: continue band-aid/leg elevation  2. URI, acute    Plan:    Discussed use of non-sedating antihistamine like Claritin and low dose benadryl 12.5 mg at night. Call if not improving.

## 2018-03-30 NOTE — Patient Instructions (Signed)
Discussed use of Claritin during the day and Benadryl 12.5 mg at bedtime.. Let us know if this does not help. Keep bandaid on the spot on your leg to keep from scratching. Elevate legs to decreased swelling.

## 2018-04-01 NOTE — Progress Notes (Signed)
[place]  [phone]) [phone] [phone]) [phone]  ID: Niylah L Tullius OB: 29-Jan-1946  MR#: [id number]  QJF#:[id number]  Patient Care Team: Jerrol Banana., MD as PCP - General (Family Medicine) Marry Guan, Laurice Record, MD as Consulting Physician (Orthopedic Surgery) Dasher, Rayvon Char, MD as Consulting Physician (Dermatology) Corey Skains, MD as Consulting Physician (Cardiology) Thelma Comp, OD as Consulting Physician (Optometry) Byrnett, Forest Gleason, MD (General Surgery) Noreene Filbert, MD as Referring Physician (Radiation Oncology) Lloyd Huger, MD as Consulting Physician (Oncology)  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  She continues to tolerate tamoxifen well without significant side effects.  She has noted increasing bilateral lower extremity edema which is being managed by her primary care physician.  She otherwise feels well and is asymptomatic. She has no neurologic complaints. She denies any recent fevers or illnesses. She has a good appetite and denies weight loss. She has no chest pain or shortness of breath. She denies any nausea, vomiting, constipation, or diarrhea. She has no urinary complaints.  Patient offers no further specific complaints today.  REVIEW OF SYSTEMS:   Review of Systems  Constitutional: Negative.  Negative for fever, malaise/fatigue and weight loss.  Respiratory: Negative.  Negative for cough and shortness of breath.   Cardiovascular: Positive for leg swelling. Negative for chest pain.  Gastrointestinal: Negative.  Negative for abdominal pain.  Genitourinary: Negative.  Negative for dysuria.  Musculoskeletal: Negative.  Negative for back pain.  Skin: Negative.  Negative for rash.  Neurological: Negative.  Negative for sensory change, focal weakness and weakness.  Endo/Heme/Allergies: Positive for environmental allergies.    Psychiatric/Behavioral: Negative.  The patient is not nervous/anxious.     As per HPI. Otherwise, a complete review of systems is negative.  PAST MEDICAL HISTORY: Past Medical History:  Diagnosis Date  . Anemia   . Arthritis   . Breast cancer ([place] 06/28/2016   right breast DCIS grade 3  . 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 Diabetes mellitus without complication ([place]   . Hyperlipidemia   . Hypertension   . Hyperthyroidism   . Obesity   . Personal history of radiation therapy   . Thyroid disease    hyperthyroidism  . Vitamin D deficiency     PAST SURGICAL HISTORY: Past Surgical History:  Procedure Laterality Date  . BREAST BIOPSY Right 06/28/2016   stereotactic biopsy - DUCTAL CARCINOMA IN SITU (DCIS), HIGH NUCLEAR GRADE  (very narrow margins)  . BREAST LUMPECTOMY Right 07/18/2016  . JOINT REPLACEMENT Left    tkr  . KNEE SURGERY Left    arthroscopy  . MASTECTOMY, PARTIAL Right 07/18/2016   Procedure: MASTECTOMY PARTIAL;  Surgeon: JRobert Bellow MD;  Location: [facility] ORS;  Service: General;  Laterality: Right;  . SKIN CANCER EXCISION     face    FAMILY HISTORY: Family History  Problem Relation Age of Onset  . Hypertension Mother   . Cervical cancer Mother   . Heart disease Father   . Hypertension Father   . Lung cancer Father   . Lung cancer Sister   . Brain cancer Sister   . Diabetes Brother   . Heart disease Brother   . Hypertension Brother   . Stroke Brother   . Breast cancer Neg Hx     ADVANCED DIRECTIVES (Y/N):  N  HEALTH MAINTENANCE: Social History   Tobacco Use  . Smoking  status: Never Smoker  . Smokeless tobacco: Never Used  Substance Use Topics  . Alcohol use: No  . Drug use: No     Colonoscopy:  PAP:  Bone density:  Lipid panel:  Allergies  Allergen Reactions  . Gabapentin Swelling  . Quinine Nausea Only and Nausea And Vomiting  . Amoxicillin Itching and Rash    Has patient had a PCN reaction causing immediate rash,  facial/tongue/throat swelling, SOB or lightheadedness with hypotension: No Has patient had a PCN reaction causing severe rash involving mucus membranes or skin necrosis: No Has patient had a PCN reaction that required hospitalization No Has patient had a PCN reaction occurring within the last 10 years: No If all of the above answers are "NO", then may proceed with Cephalosporin use.   . Dilaudid  [Hydromorphone Hcl] Rash  . Etodolac Rash  . Hydrochlorothiazide Rash  . Hydrocodone Rash  . Hydromorphone Rash  . Sulfa Antibiotics Rash    Current Outpatient Medications  Medication Sig Dispense Refill  . acetaminophen (TYLENOL) 500 MG tablet Take 500 mg by mouth every 6 (six) hours as needed for mild pain or moderate pain.    [name] atorvastatin (LIPITOR) 10 MG tablet Take 10 mg by mouth daily at 6 PM.     . Calcium Carbonate-Vit D-Min (GNP CALCIUM 1200) 1200-1000 MG-UNIT CHEW Chew 1,200 mg by mouth daily with breakfast. Take in combination with vitamin D and magnesium. 30 tablet 5  . Cholecalciferol (VITAMIN D) 2000 units CAPS Take 2,000 Units by mouth daily.     . Cholecalciferol (VITAMIN D3) 5000 units CAPS Take 1 capsule (5,000 Units total) by mouth daily with breakfast. Take along with calcium and magnesium. 30 capsule 5  . ergocalciferol (VITAMIN D2) 50000 units capsule Take 1 capsule (50,000 Units total) by mouth 2 (two) times a week. X 6 weeks. 12 capsule 0  . glipiZIDE (GLUCOTROL XL) 5 MG 24 hr tablet TAKE 1 TABLETS BY MOUTH DAILY 90 tablet 3  . glucose blood (ONETOUCH VERIO) test strip Use as instructed; Check blood sugar twice daily (Patient taking differently: Use as instructed; Check blood sugar twice daily) 100 each 12  . [name] 50-1000 MG tablet TAKE 1 TABLET BY MOUTH TWICE DAILY WITH A MEAL 180 tablet 3  . lisinopril-hydrochlorothiazide (PRINZIDE,ZESTORETIC) 20-12.5 MG tablet TAKE 2 TABLETS BY MOUTH EVERY MORNING 60 tablet 12  . Magnesium 500 MG CAPS Take 1 capsule (500 mg total) by  mouth at bedtime. 30 capsule 5  . metoprolol succinate (TOPROL-XL) 100 MG 24 hr tablet Take 1 tablet (100 mg total) by mouth daily. Take with or immediately following a meal. 90 tablet 3  . pioglitazone (ACTOS) 45 MG tablet TAKE 1 TABLET(45 MG) BY MOUTH DAILY 30 tablet 11  . tamoxifen (NOLVADEX) 20 MG tablet TAKE 1 TABLET(20 MG) BY MOUTH DAILY 90 tablet 0   No current facility-administered medications for this visit.     OBJECTIVE: Vitals:   04/05/18 1113  BP: (!) 170/81  Pulse: 80  Temp: 97.8 F (36.6 C)     Body mass index is 46.77 kg/m.    ECOG FS:0 - Asymptomatic  General: Well-developed, well-nourished, no acute distress. Eyes: Pink conjunctiva, anicteric sclera. HEENT: Normocephalic, moist mucous membranes. Breast: Patient declined breast exam today. Lungs: Clear to auscultation bilaterally. Heart: Regular rate and rhythm. No rubs, murmurs, or gallops. Abdomen: Soft, nontender, nondistended. No organomegaly noted, normoactive bowel sounds. Musculoskeletal: No edema, cyanosis, or clubbing. Neuro: Alert, answering all questions appropriately. Cranial nerves  grossly intact. Skin: No rashes or petechiae noted. Psych: Normal affect.  LAB RESULTS:  Lab Results  Component Value Date   NA 137 02/05/2018   K 4.3 02/05/2018   CL 102 02/05/2018   CO2 20 09/18/2017   GLUCOSE 145 (H) 02/05/2018   BUN 36 (H) 02/05/2018   CREATININE 1.49 (H) 02/05/2018   CALCIUM 9.1 02/05/2018   PROT 6.2 02/05/2018   ALBUMIN 3.4 (L) 02/05/2018   AST 10 02/05/2018   ALT 8 09/18/2017   ALKPHOS 35 (L) 02/05/2018   BILITOT 0.3 02/05/2018   GFRNONAA 35 (L) 02/05/2018   GFRAA 40 (L) 02/05/2018    Lab Results  Component Value Date   WBC 6.7 09/18/2017   NEUTROABS 4.1 09/18/2017   HGB 11.3 09/18/2017   HCT 36.8 09/18/2017   MCV 91 09/18/2017   PLT 225 09/18/2017     STUDIES: No results found.  ASSESSMENT: Right breast high grade DCIS.  PLAN:    1. Right breast high grade DCIS:  Final pathology was reviewed independently as also discussed at breast case conference. Patient has close margins less than 0.5 mm. Patient was given the option for reexcision to obtain better margins or proceed directly with XRT. She declined reexcision and subsequently underwent adjuvant XRT which she completed in May 2018.  Continue tamoxifen for a total of 5 years completing in May 2023.  Patient's most recent mammogram on June 14, 2017 was reported as BI-RADS 2.  Repeat in February 2020.  Return to clinic in 6 months for routine evaluation. 2.  Lower extremity edema: Continue management and treatment per primary care. 3.  Environmental allergies: Continue OTC medications as needed.  Patient expressed understanding and was in agreement with this plan. She also understands that She can call clinic at any time with any questions, concerns, or complaints.   Cancer Staging Ductal carcinoma in situ (DCIS) of right breast Staging form: Breast, AJCC 8th Edition - Pathologic stage from 08/02/2016: Stage 0 (pTis (DCIS), pN0, cM0, ER: Positive, PR: Positive, HER2: Negative) - Signed by Lloyd Huger, MD on 08/02/2016   Lloyd Huger, MD   04/07/2018 8:55 AM

## 2018-04-05 ENCOUNTER — Inpatient Hospital Stay: Payer: Medicare Other | Attending: Oncology | Admitting: Oncology

## 2018-04-05 VITALS — BP 170/81 | HR 80 | Temp 97.8°F | Ht 63.0 in | Wt 264.0 lb

## 2018-04-05 DIAGNOSIS — R609 Edema, unspecified: Secondary | ICD-10-CM | POA: Insufficient documentation

## 2018-04-05 DIAGNOSIS — Z801 Family history of malignant neoplasm of trachea, bronchus and lung: Secondary | ICD-10-CM | POA: Diagnosis not present

## 2018-04-05 DIAGNOSIS — E559 Vitamin D deficiency, unspecified: Secondary | ICD-10-CM

## 2018-04-05 DIAGNOSIS — E669 Obesity, unspecified: Secondary | ICD-10-CM | POA: Diagnosis not present

## 2018-04-05 DIAGNOSIS — M129 Arthropathy, unspecified: Secondary | ICD-10-CM | POA: Diagnosis not present

## 2018-04-05 DIAGNOSIS — E0590 Thyrotoxicosis, unspecified without thyrotoxic crisis or storm: Secondary | ICD-10-CM | POA: Diagnosis not present

## 2018-04-05 DIAGNOSIS — Z79899 Other long term (current) drug therapy: Secondary | ICD-10-CM | POA: Insufficient documentation

## 2018-04-05 DIAGNOSIS — Z9011 Acquired absence of right breast and nipple: Secondary | ICD-10-CM | POA: Insufficient documentation

## 2018-04-05 DIAGNOSIS — D0511 Intraductal carcinoma in situ of right breast: Secondary | ICD-10-CM

## 2018-04-05 DIAGNOSIS — Z79810 Long term (current) use of selective estrogen receptor modulators (SERMs): Secondary | ICD-10-CM | POA: Insufficient documentation

## 2018-04-05 DIAGNOSIS — E785 Hyperlipidemia, unspecified: Secondary | ICD-10-CM

## 2018-04-05 DIAGNOSIS — D649 Anemia, unspecified: Secondary | ICD-10-CM

## 2018-04-05 DIAGNOSIS — I10 Essential (primary) hypertension: Secondary | ICD-10-CM | POA: Diagnosis not present

## 2018-04-05 DIAGNOSIS — Z170 Estrogen receptor positive status [ER+]: Secondary | ICD-10-CM | POA: Diagnosis not present

## 2018-04-05 DIAGNOSIS — E119 Type 2 diabetes mellitus without complications: Secondary | ICD-10-CM | POA: Diagnosis not present

## 2018-04-05 NOTE — Progress Notes (Signed)
Patient is here to follow up on her right breast cancer. Patient stated that she has had bilateral leg edema for a couple of weeks. Patient's PCP gave her Lasix for two weeks but it did not help the patient. Patient stated that she would elevate her legs but it is not helping. Patient stated that she will see a vein and vascular doctor next week and hopefully she gets an answer. Patient also stated that she has had allergy symptoms (runny nose and watery eyes). She had tried taking Benadryl and it is not helping with her symptoms.

## 2018-04-09 NOTE — Progress Notes (Signed)
Patient: Carol Ramsey Female    DOB: 1945/07/05   72 y.o.   MRN: [id number] Visit Date: 04/10/2018  Today's Provider: Vernie Murders, PA   Chief Complaint  Patient presents with  . Leg Swelling   Subjective:    HPI  Patient states she has had swelling in both legs for several weeks. Patient states her left leg has a sore on the back of her calf that has been there for 1-2 weeks. Also patient states left has fluid seeping out of the pores. Patient states both legs hurt.   Past Medical History:  Diagnosis Date  . Anemia   . Arthritis   . Breast cancer ([place]) 06/28/2016   right breast DCIS grade 3  . 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 Diabetes mellitus without complication ([place])   . Hyperlipidemia   . Hypertension   . Hyperthyroidism   . Obesity   . Personal history of radiation therapy   . Thyroid disease    hyperthyroidism  . Vitamin D deficiency    Past Surgical History:  Procedure Laterality Date  . BREAST BIOPSY Right 06/28/2016   stereotactic biopsy - DUCTAL CARCINOMA IN SITU (DCIS), HIGH NUCLEAR GRADE  (very narrow margins)  . BREAST LUMPECTOMY Right 07/18/2016  . JOINT REPLACEMENT Left    tkr  . KNEE SURGERY Left    arthroscopy  . MASTECTOMY, PARTIAL Right 07/18/2016   Procedure: MASTECTOMY PARTIAL;  Surgeon: Robert Bellow, MD;  Location: [facility] ORS;  Service: General;  Laterality: Right;  . SKIN CANCER EXCISION     face   Family History  Problem Relation Age of Onset  . Hypertension Mother   . Cervical cancer Mother   . Heart disease Father   . Hypertension Father   . Lung cancer Father   . Lung cancer Sister   . Brain cancer Sister   . Diabetes Brother   . Heart disease Brother   . Hypertension Brother   . Stroke Brother   . Breast cancer Neg Hx    Allergies  Allergen Reactions  . Gabapentin Swelling  . Quinine Nausea Only and Nausea And Vomiting  . Amoxicillin Itching and Rash    Has patient had a PCN reaction causing immediate  rash, facial/tongue/throat swelling, SOB or lightheadedness with hypotension: No Has patient had a PCN reaction causing severe rash involving mucus membranes or skin necrosis: No Has patient had a PCN reaction that required hospitalization No Has patient had a PCN reaction occurring within the last 10 years: No If all of the above answers are "NO", then may proceed with Cephalosporin use.   . Dilaudid  [Hydromorphone Hcl] Rash  . Etodolac Rash  . Hydrochlorothiazide Rash  . Hydrocodone Rash  . Hydromorphone Rash  . Sulfa Antibiotics Rash    Current Outpatient Medications:  .  acetaminophen (TYLENOL) 500 MG tablet, Take 500 mg by mouth every 6 (six) hours as needed for mild pain or moderate pain., Disp: , Rfl:  .  atorvastatin (LIPITOR) 10 MG tablet, Take 10 mg by mouth daily at 6 PM. , Disp: , Rfl:  .  Cholecalciferol (VITAMIN D) 2000 units CAPS, Take 2,000 Units by mouth daily. , Disp: , Rfl:  .  Cholecalciferol (VITAMIN D3) 5000 units CAPS, Take 1 capsule (5,000 Units total) by mouth daily with breakfast. Take along with calcium and magnesium., Disp: 30 capsule, Rfl: 5 .  ergocalciferol (VITAMIN D2) 50000 units capsule, Take  1 capsule (50,000 Units total) by mouth 2 (two) times a week. X 6 weeks., Disp: 12 capsule, Rfl: 0 .  glipiZIDE (GLUCOTROL XL) 5 MG 24 hr tablet, TAKE 1 TABLETS BY MOUTH DAILY, Disp: 90 tablet, Rfl: 3 .  glucose blood (ONETOUCH VERIO) test strip, Use as instructed; Check blood sugar twice daily (Patient taking differently: Use as instructed; Check blood sugar twice daily), Disp: 100 each, Rfl: 12 .  [name] 50-1000 MG tablet, TAKE 1 TABLET BY MOUTH TWICE DAILY WITH A MEAL, Disp: 180 tablet, Rfl: 3 .  lisinopril-hydrochlorothiazide (PRINZIDE,ZESTORETIC) 20-12.5 MG tablet, TAKE 2 TABLETS BY MOUTH EVERY MORNING, Disp: 60 tablet, Rfl: 12 .  Magnesium 500 MG CAPS, Take 1 capsule (500 mg total) by mouth at bedtime., Disp: 30 capsule, Rfl: 5 .  metoprolol succinate  (TOPROL-XL) 100 MG 24 hr tablet, Take 1 tablet (100 mg total) by mouth daily. Take with or immediately following a meal., Disp: 90 tablet, Rfl: 3 .  pioglitazone (ACTOS) 45 MG tablet, TAKE 1 TABLET(45 MG) BY MOUTH DAILY, Disp: 30 tablet, Rfl: 11 .  tamoxifen (NOLVADEX) 20 MG tablet, TAKE 1 TABLET(20 MG) BY MOUTH DAILY, Disp: 90 tablet, Rfl: 0 .  Calcium Carbonate-Vit D-Min (GNP CALCIUM 1200) 1200-1000 MG-UNIT CHEW, Chew 1,200 mg by mouth daily with breakfast. Take in combination with vitamin D and magnesium. (Patient not taking: Reported on 04/10/2018), Disp: 30 tablet, Rfl: 5  Review of Systems  Constitutional: Negative for appetite change, chills, fatigue and fever.  Respiratory: Negative for chest tightness and shortness of breath.   Cardiovascular: Positive for leg swelling. Negative for chest pain and palpitations.  Gastrointestinal: Negative for abdominal pain, nausea and vomiting.  Neurological: Negative for dizziness and weakness.   Social History   Tobacco Use  . Smoking status: Never Smoker  . Smokeless tobacco: Never Used  Substance Use Topics  . Alcohol use: No   Objective:   BP (!) 165/68 (BP Location: Right Arm, Patient Position: Sitting, Cuff Size: Large)   Pulse 63   Temp 98 F (36.7 C) (Oral)   Resp 16   Wt 260 lb (117.9 kg)   SpO2 99%   BMI 46.06 kg/m  Vitals:   04/10/18 0812  BP: (!) 165/68  Pulse: 63  Resp: 16  Temp: 98 F (36.7 C)  TempSrc: Oral  SpO2: 99%  Weight: 260 lb (117.9 kg)   Physical Exam  Constitutional: She is oriented to person, place, and time. She appears well-developed and well-nourished. No distress.  HENT:  Head: Normocephalic and atraumatic.  Right Ear: Hearing normal.  Left Ear: Hearing normal.  Nose: Nose normal.  Eyes: Conjunctivae and lids are normal. Right eye exhibits no discharge. Left eye exhibits no discharge. No scleral icterus.  Pulmonary/Chest: Effort normal. No respiratory distress.  Musculoskeletal: She exhibits  edema and tenderness.  Extreme peripheral edema in legs bilaterally. Has a persistent 2.0 cm ulceration posterior left lower calf with some scabs and weeping anteriorly. Diminished sensation in the lower leg.  Neurological: She is alert and oriented to person, place, and time.  Skin: Skin is intact. No lesion and no rash noted.  Psychiatric: She has a normal mood and affect. Her speech is normal and behavior is normal. Thought content normal.      Assessment & Plan:     1. Wound cellulitis Persistent 2.0 cm ulcer on the left posterior calf that was felt to be an excoriation initially. Will get wound culture and give Doxycycline until report available. May  need referral to wound care. Extreme edema of both lower legs. - Aerobic culture - doxycycline (VIBRA-TABS) 100 MG tablet; Take 1 tablet (100 mg total) by mouth 2 (two) times daily.  Dispense: 20 tablet; Refill: 0  2. Edema of both lower extremities due to peripheral venous insufficiency Worsening edema over the past 1-2 weeks. No known injury or dyspnea. Will treat with Lasix 20 mg qd and elevation as often as possible. May need referral to PT or [facility] for edema/lymphedema . Oncologist (Dr. Grayland Ormond) was contacted by the patient to see if he would schedule this referral.  - furosemide (LASIX) 20 MG tablet; Take 1 tablet (20 mg total) by mouth daily.  Dispense: 30 tablet; Refill: 3  3. Chronic pain syndrome Back and leg pains with multiple degenerative disease issues recently evaluated by Dr. Dossie Arbour (pain management clinic).  4. Type 1 diabetes mellitus with other specified complication ([name]) States FBS was in the 70's a few days ago. Last Hgb A1C was 7.6% on 12-19-17. Continues Actos 45 mg qd, [name] 50-1000 mg BID and glipizide 5 mg qd. Scheduled for follow up with Dr. Rosanna Randy in 1 month.       Vernie Murders, PA  [facility]

## 2018-04-10 ENCOUNTER — Encounter: Payer: Self-pay | Admitting: Family Medicine

## 2018-04-10 ENCOUNTER — Ambulatory Visit (INDEPENDENT_AMBULATORY_CARE_PROVIDER_SITE_OTHER): Payer: Medicare Other | Admitting: Family Medicine

## 2018-04-10 VITALS — BP 165/68 | HR 63 | Temp 98.0°F | Resp 16 | Wt 260.0 lb

## 2018-04-10 DIAGNOSIS — I872 Venous insufficiency (chronic) (peripheral): Secondary | ICD-10-CM | POA: Diagnosis not present

## 2018-04-10 DIAGNOSIS — E1069 Type 1 diabetes mellitus with other specified complication: Secondary | ICD-10-CM | POA: Diagnosis not present

## 2018-04-10 DIAGNOSIS — G894 Chronic pain syndrome: Secondary | ICD-10-CM

## 2018-04-10 DIAGNOSIS — L0390 Cellulitis, unspecified: Secondary | ICD-10-CM

## 2018-04-10 MED ORDER — FUROSEMIDE 20 MG PO TABS: 20.0000 mg | ORAL_TABLET | Freq: Every day | ORAL | 3 refills | Status: DC

## 2018-04-10 MED ORDER — DOXYCYCLINE HYCLATE 100 MG PO TABS: 100.0000 mg | ORAL_TABLET | Freq: Two times a day (BID) | ORAL | 0 refills | Status: DC

## 2018-04-13 ENCOUNTER — Telehealth: Payer: Self-pay

## 2018-04-13 MED ORDER — CIPROFLOXACIN HCL 500 MG PO TABS: 500.0000 mg | ORAL_TABLET | Freq: Two times a day (BID) | ORAL | 0 refills | Status: DC

## 2018-04-13 NOTE — Telephone Encounter (Signed)
Pt advised RX sent to [facility] in [place].  Thanks,   -Mickel Baas

## 2018-04-13 NOTE — Telephone Encounter (Signed)
-----   Message from Margo Common, [place] sent at 04/13/2018  3:13 PM EST ----- Wound culture isolated pseudomonas bacteria causing ulcer on leg. Need to change antibiotic to Cipro 500 mg BID #20 and schedule recheck of leg Tuesday or Thursday next week.

## 2018-04-16 LAB — AEROBIC CULTURE

## 2018-04-17 ENCOUNTER — Telehealth: Payer: Self-pay | Admitting: *Deleted

## 2018-04-17 NOTE — Telephone Encounter (Signed)
LMOVM for pt to return call 

## 2018-04-17 NOTE — Telephone Encounter (Signed)
-----   Message from Margo Common, [place] sent at 04/16/2018  4:42 PM EST ----- Final culture report showed Pseudomonas and Staphylococcus bacteria. Both are sensitive to the Ciprofloxacin given. Keep follow up appointment as planned for 04-19-18.

## 2018-04-19 ENCOUNTER — Encounter: Payer: Self-pay | Admitting: Family Medicine

## 2018-04-19 ENCOUNTER — Ambulatory Visit (INDEPENDENT_AMBULATORY_CARE_PROVIDER_SITE_OTHER): Payer: Medicare Other | Admitting: Family Medicine

## 2018-04-19 VITALS — BP 102/80 | HR 77 | Temp 98.1°F | Resp 16 | Wt 256.4 lb

## 2018-04-19 DIAGNOSIS — L0390 Cellulitis, unspecified: Secondary | ICD-10-CM | POA: Diagnosis not present

## 2018-04-19 DIAGNOSIS — I83022 Varicose veins of left lower extremity with ulcer of calf: Secondary | ICD-10-CM

## 2018-04-19 DIAGNOSIS — L97221 Non-pressure chronic ulcer of left calf limited to breakdown of skin: Secondary | ICD-10-CM | POA: Diagnosis not present

## 2018-04-19 DIAGNOSIS — R600 Localized edema: Secondary | ICD-10-CM

## 2018-04-19 NOTE — Progress Notes (Signed)
Patient: Carol Ramsey Female    DOB: Jul 21, 1945   72 y.o.   MRN: [id number] Visit Date: 04/19/2018  Today's Provider: Vernie Murders, PA   Chief Complaint  Patient presents with  . Cellulitis   Subjective:     HPI  Follow Up  Wound cellulitis From 04/10/2018-advised may need referral to wound care. Wound culture obtained-showing Pseudomonas and Staphylococcus bacteria. Given rx for doxycycline (VIBRA-TABS) 100 MG tablet; Take 1 tablet (100 mg total) by mouth 2 (two) times daily.    Edema of both lower extremities due to peripheral venous insufficiency From 04/10/2018-Given rx for Lasix 20 mg qd and advised elevation as often as possible. May need referral to PT or [facility] for edema/lymphedema . Oncologist (Dr. Grayland Ormond) was contacted by the patient to see if he would schedule this referral.   Patient states today that she has completed antibiotic but is still suffering with Edema of her lower extremity, she reports good compliance on Lasix.   Past Medical History:  Diagnosis Date  . Anemia   . Arthritis   . 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 Diabetes mellitus without complication ([place])   . Hyperlipidemia   . Hypertension   . Hyperthyroidism   . Obesity   . Personal history of radiation therapy   . Thyroid disease    hyperthyroidism  . Vitamin D deficiency    Past Surgical History:  Procedure Laterality Date  . BREAST BIOPSY Right 06/28/2016   stereotactic biopsy - DUCTAL CARCINOMA IN SITU (DCIS), HIGH NUCLEAR GRADE  (very narrow margins)  . BREAST LUMPECTOMY Right 07/18/2016  . JOINT REPLACEMENT Left    tkr  . KNEE SURGERY Left    arthroscopy  . MASTECTOMY, PARTIAL Right 07/18/2016   Procedure: MASTECTOMY PARTIAL;  Surgeon: Robert Bellow, MD;  Location: [facility] ORS;  Service: General;  Laterality: Right;  . SKIN CANCER EXCISION     face   Family History  Problem Relation Age  of Onset  . Hypertension Mother   . Cervical cancer Mother   . Heart disease Father   . Hypertension Father   . Lung cancer Father   . Lung cancer Sister   . Brain cancer Sister   . Diabetes Brother   . Heart disease Brother   . Hypertension Brother   . Stroke Brother   . Breast cancer Neg Hx    Allergies  Allergen Reactions  . Gabapentin Swelling  . Quinine Nausea Only and Nausea And Vomiting  . Amoxicillin Itching and Rash    Has patient had a PCN reaction causing immediate rash, facial/tongue/throat swelling, SOB or lightheadedness with hypotension: No Has patient had a PCN reaction causing severe rash involving mucus membranes or skin necrosis: No Has patient had a PCN reaction that required hospitalization No Has patient had a PCN reaction occurring within the last 10 years: No If all of the above answers are "NO", then may proceed with Cephalosporin use.   . Dilaudid  [Hydromorphone Hcl] Rash  . Etodolac Rash  . Hydrochlorothiazide Rash  . Hydrocodone Rash  . Hydromorphone Rash  . Sulfa Antibiotics Rash    Current Outpatient Medications:  .  acetaminophen (TYLENOL) 500 MG tablet, Take 500 mg by mouth every 6 (six) hours as needed for mild pain or moderate pain., Disp: , Rfl:  .  atorvastatin (LIPITOR) 10 MG  tablet, Take 10 mg by mouth daily at 6 PM. , Disp: , Rfl:  .  Cholecalciferol (VITAMIN D) 2000 units CAPS, Take 2,000 Units by mouth daily. , Disp: , Rfl:  .  Cholecalciferol (VITAMIN D3) 5000 units CAPS, Take 1 capsule (5,000 Units total) by mouth daily with breakfast. Take along with calcium and magnesium., Disp: 30 capsule, Rfl: 5 .  ciprofloxacin (CIPRO) 500 MG tablet, Take 1 tablet (500 mg total) by mouth 2 (two) times daily., Disp: 20 tablet, Rfl: 0 .  furosemide (LASIX) 20 MG tablet, Take 1 tablet (20 mg total) by mouth daily., Disp: 30 tablet, Rfl: 3 .  glipiZIDE (GLUCOTROL XL) 5 MG 24 hr tablet, TAKE 1 TABLETS BY MOUTH DAILY, Disp: 90 tablet, Rfl: 3 .   glucose blood (ONETOUCH VERIO) test strip, Use as instructed; Check blood sugar twice daily (Patient taking differently: Use as instructed; Check blood sugar twice daily), Disp: 100 each, Rfl: 12 .  [name] 50-1000 MG tablet, TAKE 1 TABLET BY MOUTH TWICE DAILY WITH A MEAL, Disp: 180 tablet, Rfl: 3 .  lisinopril-hydrochlorothiazide (PRINZIDE,ZESTORETIC) 20-12.5 MG tablet, TAKE 2 TABLETS BY MOUTH EVERY MORNING, Disp: 60 tablet, Rfl: 12 .  Magnesium 500 MG CAPS, Take 1 capsule (500 mg total) by mouth at bedtime., Disp: 30 capsule, Rfl: 5 .  metoprolol succinate (TOPROL-XL) 100 MG 24 hr tablet, Take 1 tablet (100 mg total) by mouth daily. Take with or immediately following a meal., Disp: 90 tablet, Rfl: 3 .  pioglitazone (ACTOS) 45 MG tablet, TAKE 1 TABLET(45 MG) BY MOUTH DAILY, Disp: 30 tablet, Rfl: 11 .  tamoxifen (NOLVADEX) 20 MG tablet, TAKE 1 TABLET(20 MG) BY MOUTH DAILY, Disp: 90 tablet, Rfl: 0 .  Calcium Carbonate-Vit D-Min (GNP CALCIUM 1200) 1200-1000 MG-UNIT CHEW, Chew 1,200 mg by mouth daily with breakfast. Take in combination with vitamin D and magnesium. (Patient not taking: Reported on 04/10/2018), Disp: 30 tablet, Rfl: 5  Review of Systems  Constitutional: Negative for appetite change, chills, fatigue and fever.  Respiratory: Negative for chest tightness and shortness of breath.   Cardiovascular: Negative for chest pain and palpitations.  Gastrointestinal: Negative for abdominal pain, nausea and vomiting.  Musculoskeletal: Positive for myalgias.  Neurological: Negative for dizziness and weakness.   Social History   Tobacco Use  . Smoking status: Never Smoker  . Smokeless tobacco: Never Used  Substance Use Topics  . Alcohol use: No     Objective:   BP 102/80   Pulse 77   Temp 98.1 F (36.7 C) (Oral)   Resp 16   Wt 256 lb 6.4 oz (116.3 kg)   BMI 45.42 kg/m   Wt Readings from Last 3 Encounters:  04/19/18 256 lb 6.4 oz (116.3 kg)  04/10/18 260 lb (117.9 kg)  04/05/18 264  lb (119.7 kg)   Vitals:   04/19/18 0954  BP: 102/80  Pulse: 77  Resp: 16  Temp: 98.1 F (36.7 C)  TempSrc: Oral  Weight: 256 lb 6.4 oz (116.3 kg)   Physical Exam Constitutional:      General: She is not in acute distress.    Appearance: She is well-developed.  HENT:     Head: Normocephalic and atraumatic.     Right Ear: Hearing normal.     Left Ear: Hearing normal.     Nose: Nose normal.  Eyes:     General: Lids are normal. No scleral icterus.       Right eye: No discharge.  Left eye: No discharge.     Conjunctiva/sclera: Conjunctivae normal.  Pulmonary:     Effort: Pulmonary effort is normal. No respiratory distress.  Musculoskeletal:     Right lower leg: Edema present.     Left lower leg: Edema present.  Skin:    Findings: No lesion or rash.     Comments: Scabs on the anterior left lower leg are dry today without erythema. Stasis ulcer on the left calf is more shallow without erythema. Only mild clear drainage now. No tenderness. Both lower legs 4+ edema but not as tense on the right.  Neurological:     Mental Status: She is alert and oriented to person, place, and time.  Psychiatric:        Speech: Speech normal.        Behavior: Behavior normal.        Thought Content: Thought content normal.       Assessment & Plan    1. Venous stasis ulcer of left calf limited to breakdown of skin, unspecified whether varicose veins present ([name]) Edema with 1.5-2.0 stasis ulcer on the left calf slowly improving. Has lost 4 lbs since 04-10-18. Still sore lower legs to palpation but no further erythema. May need to consider compression boots for lymphedema.  2. Bilateral leg edema Slight change with 8 lb weight loss since 04-05-18. Been using Lasix 20 mg qd since 04-10-18. BP much better. With history of hypomagnesemia, will continue supplement and proceed with follow up appointment on 05-10-18 with Dr. Rosanna Randy for follow up labs.  3. Wound cellulitis Much improved stasis  ulcer with cellulitis. No erythema or purulent discharge today. Wound culture isolated Pseudomonas and Staph bacteria susceptible to Cipro. Changed from Doxycycline to Cipro on 04-13-18 and should finish it on 04-23-18. Continue to clean with warm soap and water. Change Band-Aid daily and recheck if needed before follow up appointment scheduled for 05-10-18.     Vernie Murders, PA  [facility]

## 2018-04-20 NOTE — Telephone Encounter (Signed)
Patient seen in office 04/19/2018.

## 2018-04-24 NOTE — Telephone Encounter (Signed)
Patient advised.KW 

## 2018-04-27 ENCOUNTER — Other Ambulatory Visit: Payer: Self-pay | Admitting: Oncology

## 2018-05-10 ENCOUNTER — Ambulatory Visit (INDEPENDENT_AMBULATORY_CARE_PROVIDER_SITE_OTHER): Payer: Medicare Other | Admitting: Family Medicine

## 2018-05-10 ENCOUNTER — Encounter: Payer: Self-pay | Admitting: Family Medicine

## 2018-05-10 VITALS — BP 138/68 | HR 69 | Temp 98.3°F | Wt 254.8 lb

## 2018-05-10 DIAGNOSIS — D0511 Intraductal carcinoma in situ of right breast: Secondary | ICD-10-CM

## 2018-05-10 DIAGNOSIS — R600 Localized edema: Secondary | ICD-10-CM

## 2018-05-10 DIAGNOSIS — I10 Essential (primary) hypertension: Secondary | ICD-10-CM | POA: Diagnosis not present

## 2018-05-10 DIAGNOSIS — E118 Type 2 diabetes mellitus with unspecified complications: Secondary | ICD-10-CM | POA: Diagnosis not present

## 2018-05-10 DIAGNOSIS — N183 Chronic kidney disease, stage 3 unspecified: Secondary | ICD-10-CM

## 2018-05-10 DIAGNOSIS — Z6841 Body Mass Index (BMI) 40.0 and over, adult: Secondary | ICD-10-CM | POA: Diagnosis not present

## 2018-05-10 DIAGNOSIS — G8929 Other chronic pain: Secondary | ICD-10-CM | POA: Diagnosis not present

## 2018-05-10 DIAGNOSIS — M5442 Lumbago with sciatica, left side: Secondary | ICD-10-CM | POA: Diagnosis not present

## 2018-05-10 NOTE — Progress Notes (Signed)
Patient: Carol Ramsey Female    DOB: 05-13-1945   73 y.o.   MRN: [id number] Visit Date: 05/10/2018  Today's Provider: [name], MD   Chief Complaint  Patient presents with  . Back Pain  . Edema   Subjective:    HPI  Back Pain Patient present today for 4 month follow-up for lower back pain. Patient was referred to pain clinic at last office visit. Patient states she has been to the pain clinic and the doctor states he would like to start injection to her lower back but she was ready to decide. Patient states she really does not care for the doctor that she seen the day of her visit.   Edema Patient is presents today for lower leg edema 4 month follow-up. Patient was advised to stop Lasix at last appointment 01/08/2018 and to try knee high TED hose. Patient states she has not stop taking her Lasix and have not found any knee high TED hose.  Patient is using a new lymphedema pump.  Helped.  She has chronic rhinorrhea.  No other symptoms.  Allergies  Allergen Reactions  . Gabapentin Swelling  . Quinine Nausea Only and Nausea And Vomiting  . Amoxicillin Itching and Rash    Has patient had a PCN reaction causing immediate rash, facial/tongue/throat swelling, SOB or lightheadedness with hypotension: No Has patient had a PCN reaction causing severe rash involving mucus membranes or skin necrosis: No Has patient had a PCN reaction that required hospitalization No Has patient had a PCN reaction occurring within the last 10 years: No If all of the above answers are "NO", then may proceed with Cephalosporin use.   . Dilaudid  [Hydromorphone Hcl] Rash  . Etodolac Rash  . Hydrochlorothiazide Rash  . Hydrocodone Rash  . Hydromorphone Rash  . Sulfa Antibiotics Rash     Current Outpatient Medications:  .  acetaminophen (TYLENOL) 500 MG tablet, Take 500 mg by mouth every 6 (six) hours as needed for mild pain or moderate pain., Disp: , Rfl:  .  atorvastatin (LIPITOR) 10  MG tablet, Take 10 mg by mouth daily at 6 PM. , Disp: , Rfl:  .  Cholecalciferol (VITAMIN D) 2000 units CAPS, Take 2,000 Units by mouth daily. , Disp: , Rfl:  .  Cholecalciferol (VITAMIN D3) 5000 units CAPS, Take 1 capsule (5,000 Units total) by mouth daily with breakfast. Take along with calcium and magnesium., Disp: 30 capsule, Rfl: 5 .  ciprofloxacin (CIPRO) 500 MG tablet, Take 1 tablet (500 mg total) by mouth 2 (two) times daily., Disp: 20 tablet, Rfl: 0 .  furosemide (LASIX) 20 MG tablet, Take 1 tablet (20 mg total) by mouth daily., Disp: 30 tablet, Rfl: 3 .  glipiZIDE (GLUCOTROL XL) 5 MG 24 hr tablet, TAKE 1 TABLETS BY MOUTH DAILY, Disp: 90 tablet, Rfl: 3 .  glucose blood (ONETOUCH VERIO) test strip, Use as instructed; Check blood sugar twice daily (Patient taking differently: Use as instructed; Check blood sugar twice daily), Disp: 100 each, Rfl: 12 .  [name] 50-1000 MG tablet, TAKE 1 TABLET BY MOUTH TWICE DAILY WITH A MEAL, Disp: 180 tablet, Rfl: 3 .  lisinopril-hydrochlorothiazide (PRINZIDE,ZESTORETIC) 20-12.5 MG tablet, TAKE 2 TABLETS BY MOUTH EVERY MORNING, Disp: 60 tablet, Rfl: 12 .  Magnesium 500 MG CAPS, Take 1 capsule (500 mg total) by mouth at bedtime., Disp: 30 capsule, Rfl: 5 .  metoprolol succinate (TOPROL-XL) 100 MG 24 hr tablet, Take 1 tablet (100  mg total) by mouth daily. Take with or immediately following a meal., Disp: 90 tablet, Rfl: 3 .  pioglitazone (ACTOS) 45 MG tablet, TAKE 1 TABLET(45 MG) BY MOUTH DAILY, Disp: 30 tablet, Rfl: 11 .  tamoxifen (NOLVADEX) 20 MG tablet, TAKE 1 TABLET(20 MG) BY MOUTH DAILY, Disp: 90 tablet, Rfl: 3 .  Calcium Carbonate-Vit D-Min (GNP CALCIUM 1200) 1200-1000 MG-UNIT CHEW, Chew 1,200 mg by mouth daily with breakfast. Take in combination with vitamin D and magnesium. (Patient not taking: Reported on 04/10/2018), Disp: 30 tablet, Rfl: 5  Review of Systems  Constitutional: Negative.   HENT: Negative.   Respiratory: Negative.   Cardiovascular:  Positive for leg swelling.  Endocrine: Negative.   Genitourinary: Negative.   Musculoskeletal: Positive for back pain.  Allergic/Immunologic: Negative.   Neurological: Negative.   Psychiatric/Behavioral: Negative.     Social History   Tobacco Use  . Smoking status: Never Smoker  . Smokeless tobacco: Never Used  Substance Use Topics  . Alcohol use: No      Objective:   BP 138/68 (BP Location: Right Arm, Patient Position: Sitting, Cuff Size: Large)   Pulse 69   Temp 98.3 F (36.8 C) (Oral)   Wt 254 lb 12.8 oz (115.6 kg)   SpO2 99%   BMI 45.14 kg/m  Vitals:   05/10/18 1056  BP: 138/68  Pulse: 69  Temp: 98.3 F (36.8 C)  TempSrc: Oral  SpO2: 99%  Weight: 254 lb 12.8 oz (115.6 kg)     Physical Exam Constitutional:      Appearance: She is well-developed.     Comments: Obese WF NAD.  HENT:     Head: Normocephalic and atraumatic.     Right Ear: External ear normal.     Left Ear: External ear normal.     Nose: Nose normal.  Eyes:     General: No scleral icterus.    Conjunctiva/sclera: Conjunctivae normal.  Neck:     Thyroid: No thyromegaly.  Cardiovascular:     Rate and Rhythm: Normal rate and regular rhythm.     Heart sounds: Normal heart sounds.  Pulmonary:     Effort: Pulmonary effort is normal.     Breath sounds: Normal breath sounds.  Abdominal:     Palpations: Abdomen is soft.  Skin:    General: Skin is warm and dry.  Neurological:     General: No focal deficit present.     Mental Status: She is alert and oriented to person, place, and time. Mental status is at baseline.  Psychiatric:        Mood and Affect: Mood normal.        Behavior: Behavior normal.        Thought Content: Thought content normal.        Judgment: Judgment normal.         Assessment & Plan     1. Type 2 diabetes mellitus with complication, without long-term current use of insulin ([name]) Goal A1c less than 7-7.5. - Hemoglobin A1c  2. CKD (chronic kidney disease) stage  3, GFR 30-59 ml/min ([name])  - Renal function panel  3. Essential (primary) hypertension Controlled 4. Chronic low back pain (Primary Area of Pain) (Bilateral) w/ sciatica (Left) Patient states that this is her primary issue.  He is followed by pain clinic.  5. Morbid obesity with BMI of 40.0-44.9, adult [facility]) I think this is the biggest issue affecting the patient's health.  A loss would help all of her problems.  I do not think she can move at all for any exercise but do hope that she can cut back on eating.  6. Ductal carcinoma in situ (DCIS) of right breast 7.Edema Using lymphedema pump. 8.  Rhinorrhea Try Dyna mist nasal spray.  I, Hurman Horn, CMA, am acting as a scribe for [name], MD.    I have done the exam and reviewed the above chart and it is accurate to the best of my knowledge. Development worker, community has been used in this note in any air is in the dictation or transcription are unintentional.  [name], MD  [place]

## 2018-05-11 LAB — RENAL FUNCTION PANEL
Albumin: 3.9 g/dL (ref 3.5–4.8)
BUN/Creatinine Ratio: 15 (ref 12–28)
BUN: 21 mg/dL (ref 8–27)
CO2: 21 mmol/L (ref 20–29)
CREATININE: 1.39 mg/dL — AB (ref 0.57–1.00)
Calcium: 9.2 mg/dL (ref 8.7–10.3)
Chloride: 102 mmol/L (ref 96–106)
GFR calc Af Amer: 44 mL/min/{1.73_m2} — ABNORMAL LOW (ref >59)
GFR calc non Af Amer: 38 mL/min/{1.73_m2} — ABNORMAL LOW (ref >59)
Glucose: 157 mg/dL — ABNORMAL HIGH (ref 65–99)
Phosphorus: 2.9 mg/dL (ref 2.5–4.5)
Potassium: 4.2 mmol/L (ref 3.5–5.2)
Sodium: 140 mmol/L (ref 134–144)

## 2018-05-11 LAB — HEMOGLOBIN A1C
ESTIMATED AVERAGE GLUCOSE: 160 mg/dL
Hgb A1c MFr Bld: 7.2 % — ABNORMAL HIGH (ref 4.8–5.6)

## 2018-05-14 ENCOUNTER — Other Ambulatory Visit: Payer: Self-pay | Admitting: Family Medicine

## 2018-05-14 DIAGNOSIS — I10 Essential (primary) hypertension: Secondary | ICD-10-CM

## 2018-05-15 ENCOUNTER — Telehealth: Payer: Self-pay

## 2018-05-15 DIAGNOSIS — I83893 Varicose veins of bilateral lower extremities with other complications: Secondary | ICD-10-CM | POA: Diagnosis not present

## 2018-05-15 NOTE — Telephone Encounter (Signed)
Left message to call back  

## 2018-05-15 NOTE — Telephone Encounter (Signed)
Advised patient of results.  

## 2018-05-15 NOTE — Telephone Encounter (Signed)
-----   Message from Jerrol Banana., MD sent at 05/15/2018  8:10 AM EST ----- Stable.

## 2018-05-22 DIAGNOSIS — I83893 Varicose veins of bilateral lower extremities with other complications: Secondary | ICD-10-CM | POA: Diagnosis not present

## 2018-06-11 NOTE — Progress Notes (Signed)
Patient: Carol Ramsey Female    DOB: Dec 20, 1945   73 y.o.   MRN: [id number] Visit Date: 06/12/2018  Today's Provider: Vernie Murders, PA   Chief Complaint  Patient presents with  . Follow-up   Subjective:   HPI    Venous stasis ulcer of left calf limited to breakdown of skin, unspecified whether varicose veins present ([place]) From 04/19/2018-May need to consider compression boots for lymphedema.  Wound cellulitis From 04/19/2018-Changed from Doxycycline to Cipro on 04-13-18 and should finish it on 04-23-18. Continue to clean with warm soap and water. Change Band-Aid daily and recheck if needed before follow up appointment scheduled for 05-10-18.  Patient states wound is still open, red, and oozing fluid.   Past Medical History:  Diagnosis Date  . Anemia   . Arthritis   . 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 Diabetes mellitus without complication ([place])   . Hyperlipidemia   . Hypertension   . Hyperthyroidism   . Obesity   . Personal history of radiation therapy   . Thyroid disease    hyperthyroidism  . Vitamin D deficiency    Past Surgical History:  Procedure Laterality Date  . BREAST BIOPSY Right 06/28/2016   stereotactic biopsy - DUCTAL CARCINOMA IN SITU (DCIS), HIGH NUCLEAR GRADE  (very narrow margins)  . BREAST LUMPECTOMY Right 07/18/2016  . JOINT REPLACEMENT Left    tkr  . KNEE SURGERY Left    arthroscopy  . MASTECTOMY, PARTIAL Right 07/18/2016   Procedure: MASTECTOMY PARTIAL;  Surgeon: Robert Bellow, MD;  Location: [facility] ORS;  Service: General;  Laterality: Right;  . SKIN CANCER EXCISION     face   Family History  Problem Relation Age of Onset  . Hypertension Mother   . Cervical cancer Mother   . Heart disease Father   . Hypertension Father   . Lung cancer Father   . Lung cancer Sister   . Brain cancer Sister   . Diabetes Brother   . Heart disease Brother   . Hypertension  Brother   . Stroke Brother   . Breast cancer Neg Hx    Allergies  Allergen Reactions  . Gabapentin Swelling  . Quinine Nausea Only and Nausea And Vomiting  . Amoxicillin Itching and Rash    Has patient had a PCN reaction causing immediate rash, facial/tongue/throat swelling, SOB or lightheadedness with hypotension: No Has patient had a PCN reaction causing severe rash involving mucus membranes or skin necrosis: No Has patient had a PCN reaction that required hospitalization No Has patient had a PCN reaction occurring within the last 10 years: No If all of the above answers are "NO", then may proceed with Cephalosporin use.   . Dilaudid  [Hydromorphone Hcl] Rash  . Etodolac Rash  . Hydrochlorothiazide Rash  . Hydrocodone Rash  . Hydromorphone Rash  . Sulfa Antibiotics Rash    Current Outpatient Medications:  .  acetaminophen (TYLENOL) 500 MG tablet, Take 500 mg by mouth every 6 (six) hours as needed for mild pain or moderate pain., Disp: , Rfl:  .  atorvastatin (LIPITOR) 10 MG tablet, Take 10 mg by mouth daily at 6 PM. , Disp: , Rfl:  .  Calcium Carbonate-Vit D-Min (GNP CALCIUM 1200) 1200-1000 MG-UNIT CHEW, Chew 1,200 mg by mouth daily with breakfast. Take in combination with vitamin D and magnesium., Disp: 30 tablet, Rfl:  5 .  Cholecalciferol (VITAMIN D) 2000 units CAPS, Take 2,000 Units by mouth daily. , Disp: , Rfl:  .  Cholecalciferol (VITAMIN D3) 5000 units CAPS, Take 1 capsule (5,000 Units total) by mouth daily with breakfast. Take along with calcium and magnesium., Disp: 30 capsule, Rfl: 5 .  furosemide (LASIX) 20 MG tablet, Take 1 tablet (20 mg total) by mouth daily., Disp: 30 tablet, Rfl: 3 .  glipiZIDE (GLUCOTROL XL) 5 MG 24 hr tablet, TAKE 1 TABLETS BY MOUTH DAILY, Disp: 90 tablet, Rfl: 3 .  glucose blood (ONETOUCH VERIO) test strip, Use as instructed; Check blood sugar twice daily (Patient taking differently: Use as instructed; Check blood sugar twice daily), Disp: 100 each,  Rfl: 12 .  [name] 50-1000 MG tablet, TAKE 1 TABLET BY MOUTH TWICE DAILY WITH A MEAL, Disp: 180 tablet, Rfl: 3 .  lisinopril-hydrochlorothiazide (PRINZIDE,ZESTORETIC) 20-12.5 MG tablet, TAKE 2 TABLETS BY MOUTH EVERY MORNING, Disp: 60 tablet, Rfl: 12 .  Magnesium 500 MG CAPS, Take 1 capsule (500 mg total) by mouth at bedtime., Disp: 30 capsule, Rfl: 5 .  metoprolol succinate (TOPROL-XL) 100 MG 24 hr tablet, TAKE 1 TABLET BY MOUTH DAILY WITH OR IMMEDIATELY FOLLOWING A MEAL, Disp: 90 tablet, Rfl: 3 .  pioglitazone (ACTOS) 45 MG tablet, TAKE 1 TABLET(45 MG) BY MOUTH DAILY, Disp: 30 tablet, Rfl: 11 .  tamoxifen (NOLVADEX) 20 MG tablet, TAKE 1 TABLET(20 MG) BY MOUTH DAILY, Disp: 90 tablet, Rfl: 3 .  ciprofloxacin (CIPRO) 500 MG tablet, Take 1 tablet (500 mg total) by mouth 2 (two) times daily. (Patient not taking: Reported on 06/12/2018), Disp: 20 tablet, Rfl: 0  Review of Systems  Constitutional: Negative for appetite change, chills, fatigue and fever.  Respiratory: Negative for chest tightness and shortness of breath.   Cardiovascular: Negative for chest pain and palpitations.  Gastrointestinal: Negative for abdominal pain, nausea and vomiting.  Neurological: Negative for dizziness and weakness.   Social History   Tobacco Use  . Smoking status: Never Smoker  . Smokeless tobacco: Never Used  Substance Use Topics  . Alcohol use: No     Objective:   BP (!) 140/58 (BP Location: Right Arm, Patient Position: Sitting, Cuff Size: Large)   Pulse (!) 58   Temp 98.1 F (36.7 C) (Oral)   Resp 16   Wt 253 lb (114.8 kg)   SpO2 97%   BMI 44.82 kg/m  Vitals:   06/12/18 1001  BP: (!) 140/58  Pulse: (!) 58  Resp: 16  Temp: 98.1 F (36.7 C)  TempSrc: Oral  SpO2: 97%  Weight: 253 lb (114.8 kg)   Physical Exam Constitutional:      General: She is not in acute distress.    Appearance: She is well-developed.  HENT:     Head: Normocephalic and atraumatic.     Right Ear: Hearing normal.      Left Ear: Hearing normal.     Nose: Nose normal.  Eyes:     General: Lids are normal. No scleral icterus.       Right eye: No discharge.        Left eye: No discharge.     Conjunctiva/sclera: Conjunctivae normal.  Cardiovascular:     Rate and Rhythm: Normal rate and regular rhythm.  Pulmonary:     Effort: Pulmonary effort is normal. No respiratory distress.  Musculoskeletal:     Right lower leg: Edema present.     Left lower leg: Edema present.     Comments: 4+  edema of both lower legs with a 1.5 x 1.5 cm ulcer posterior left calf. Some tenderness of entire lower legs bilaterally from edema. No purulent discharge from ulcer only serous weeping. No erythema.  Skin:    Findings: No lesion or rash.  Neurological:     Mental Status: She is alert and oriented to person, place, and time.  Psychiatric:        Speech: Speech normal.        Behavior: Behavior normal.        Thought Content: Thought content normal.   Media Information   Document Information   Photos    06/12/2018 10:32  Attached To:  Office Visit on 06/12/18 with Laramie Meissner, Vickki Muff, PA  Source Information   Abigail Marsiglia, Vickki Muff, [place]  [facility]    1. Chronic cutaneous venous stasis ulcer ([name]) Continue to have the same ulcer on the posterior left calf. No sign of purulent drainage or significant erythema. Constant serous weeping. May use wet-to-dry dressings after cleaning daily with soap and water. Schedule wound care evaluation and treatment.  - AMB referral to wound care center  2. Bilateral leg edema Has been using Lasix 20 mg qd for edema and compression boot pumps to help lymphedema the past month. Followed by cardiologist for CHF.     Vernie Murders, PA  [facility]

## 2018-06-12 ENCOUNTER — Encounter: Payer: Self-pay | Admitting: Family Medicine

## 2018-06-12 ENCOUNTER — Ambulatory Visit (INDEPENDENT_AMBULATORY_CARE_PROVIDER_SITE_OTHER): Payer: Medicare Other | Admitting: Family Medicine

## 2018-06-12 VITALS — BP 140/58 | HR 58 | Temp 98.1°F | Resp 16 | Wt 253.0 lb

## 2018-06-12 DIAGNOSIS — I83009 Varicose veins of unspecified lower extremity with ulcer of unspecified site: Secondary | ICD-10-CM | POA: Diagnosis not present

## 2018-06-12 DIAGNOSIS — L97909 Non-pressure chronic ulcer of unspecified part of unspecified lower leg with unspecified severity: Secondary | ICD-10-CM

## 2018-06-12 DIAGNOSIS — R600 Localized edema: Secondary | ICD-10-CM

## 2018-06-18 ENCOUNTER — Other Ambulatory Visit: Payer: Medicare Other

## 2018-06-18 ENCOUNTER — Ambulatory Visit
Admission: RE | Admit: 2018-06-18 | Discharge: 2018-06-18 | Disposition: A | Discharge status: Home / Self Care | Payer: Medicare Other | Source: Ambulatory Visit | Attending: Oncology | Admitting: Oncology

## 2018-06-18 DIAGNOSIS — D0511 Intraductal carcinoma in situ of right breast: Secondary | ICD-10-CM | POA: Diagnosis not present

## 2018-06-18 DIAGNOSIS — R928 Other abnormal and inconclusive findings on diagnostic imaging of breast: Secondary | ICD-10-CM | POA: Diagnosis not present

## 2018-06-19 DIAGNOSIS — I83893 Varicose veins of bilateral lower extremities with other complications: Secondary | ICD-10-CM | POA: Diagnosis not present

## 2018-06-20 ENCOUNTER — Encounter: Payer: Medicare Other | Attending: Internal Medicine | Admitting: Internal Medicine

## 2018-06-20 DIAGNOSIS — Z96652 Presence of left artificial knee joint: Secondary | ICD-10-CM | POA: Insufficient documentation

## 2018-06-20 DIAGNOSIS — E11622 Type 2 diabetes mellitus with other skin ulcer: Secondary | ICD-10-CM | POA: Insufficient documentation

## 2018-06-20 DIAGNOSIS — I872 Venous insufficiency (chronic) (peripheral): Secondary | ICD-10-CM | POA: Insufficient documentation

## 2018-06-20 DIAGNOSIS — I87312 Chronic venous hypertension (idiopathic) with ulcer of left lower extremity: Secondary | ICD-10-CM | POA: Insufficient documentation

## 2018-06-20 DIAGNOSIS — N183 Chronic kidney disease, stage 3 (moderate): Secondary | ICD-10-CM | POA: Diagnosis not present

## 2018-06-20 DIAGNOSIS — E1122 Type 2 diabetes mellitus with diabetic chronic kidney disease: Secondary | ICD-10-CM | POA: Diagnosis not present

## 2018-06-20 DIAGNOSIS — L97211 Non-pressure chronic ulcer of right calf limited to breakdown of skin: Secondary | ICD-10-CM | POA: Insufficient documentation

## 2018-06-20 DIAGNOSIS — S81802A Unspecified open wound, left lower leg, initial encounter: Secondary | ICD-10-CM | POA: Diagnosis not present

## 2018-06-20 DIAGNOSIS — Z86000 Personal history of in-situ neoplasm of breast: Secondary | ICD-10-CM | POA: Diagnosis not present

## 2018-06-20 DIAGNOSIS — E785 Hyperlipidemia, unspecified: Secondary | ICD-10-CM | POA: Diagnosis not present

## 2018-06-20 DIAGNOSIS — I890 Lymphedema, not elsewhere classified: Secondary | ICD-10-CM | POA: Insufficient documentation

## 2018-06-21 NOTE — Progress Notes (Signed)
Carol Ramsey, [name] ([id number]) Visit Report for 06/20/2018 Abuse/Suicide Risk Screen Details Patient Name: Carol Ramsey, Carol Ramsey. Date of Service: 06/20/2018 10:15 AM Medical Record Number: [id number] Patient Account Number: [id number] Date of Birth/Sex: [birth year] (73 y.o. Female) Treating RN: Montey Hora Primary Care Dimonique Bourdeau: Cranford Mon, Delfino Lovett Other Clinician: Referring Tisheena Maguire: Vernie Murders Treating Emalee Knies/Extender: Tito Dine in Treatment: 0 Abuse/Suicide Risk Screen Items Answer ABUSE/SUICIDE RISK SCREEN: Has anyone close to you tried to hurt or harm you recentlyo No Do you feel uncomfortable with anyone in your familyo No Has anyone forced you do things that you didnot want to doo No Do you have any thoughts of harming yourselfo No Patient displays signs or symptoms of abuse and/or neglect. No Electronic Signature(s) Signed: 06/20/2018 5:36:16 PM By: Montey Hora Entered By: Montey Hora on 06/20/2018 10:23:29 Carol Ramsey, [name] ([id number]) -------------------------------------------------------------------------------- Activities of Daily Living Details Patient Name: Carol Ramsey, Carol Ramsey. Date of Service: 06/20/2018 10:15 AM Medical Record Number: [id number] Patient Account Number: [id number] Date of Birth/Sex: 04-20-1946 (73 y.o. Female) Treating RN: Montey Hora Primary Care Janis Sol: Cranford Mon, Delfino Lovett Other Clinician: Referring Rafael Quesada: Vernie Murders Treating Romi Rathel/Extender: Tito Dine in Treatment: 0 Activities of Daily Living Items Answer Activities of Daily Living (Please select one for each item) Drive Automobile Not Able Take Medications Completely Able Use Telephone Completely Able Care for Appearance Completely Able Use Toilet Completely Able Bath / Shower Completely Able Dress Self Completely Able Feed Self Completely Able Walk Completely Able Get In / Out Bed Completely Able Housework Completely Able Prepare Meals  Completely Able Handle Money Completely Able Shop for Self Completely Able Electronic Signature(s) Signed: 06/20/2018 5:36:16 PM By: Montey Hora Entered By: Montey Hora on 06/20/2018 10:23:54 Carol Ramsey, [name] ([id number]) -------------------------------------------------------------------------------- Education Assessment Details Patient Name: Carol Ramsey, Carol Ramsey. Date of Service: 06/20/2018 10:15 AM Medical Record Number: [id number] Patient Account Number: [id number] Date of Birth/Sex: 1946-01-20 (73 y.o. Female) Treating RN: Montey Hora Primary Care Tarrence Enck: Cranford Mon, Delfino Lovett Other Clinician: Referring Angel Weedon: Vernie Murders Treating Henretta Quist/Extender: Tito Dine in Treatment: 0 Primary Learner Assessed: Patient Learning Preferences/Education Level/Primary Language Learning Preference: Explanation, Demonstration Highest Education Level: College or Above Preferred Language: English Cognitive Barrier Assessment/Beliefs Language Barrier: No Translator Needed: No Memory Deficit: No Emotional Barrier: No Cultural/Religious Beliefs Affecting Medical Care: No Physical Barrier Assessment Impaired Vision: No Impaired Hearing: No Decreased Hand dexterity: No Knowledge/Comprehension Assessment Knowledge Level: Medium Comprehension Level: Medium Ability to understand written Medium instructions: Ability to understand verbal Medium instructions: Motivation Assessment Anxiety Level: Calm Cooperation: Cooperative Education Importance: Acknowledges Need Interest in Health Problems: Asks Questions Perception: Coherent Willingness to Engage in Self- Medium Management Activities: Readiness to Engage in Self- Medium Management Activities: Electronic Signature(s) Signed: 06/20/2018 5:36:16 PM By: Montey Hora Entered By: Montey Hora on 06/20/2018 10:24:20 Carol Ramsey, [name]  ([id number]) -------------------------------------------------------------------------------- Fall Risk Assessment Details Patient Name: Carol Ramsey, Carol Ramsey. Date of Service: 06/20/2018 10:15 AM Medical Record Number: [id number] Patient Account Number: [id number] Date of Birth/Sex: 1945/07/07 (72 y.o. Female) Treating RN: Montey Hora Primary Care Lylian Sanagustin: Cranford Mon, Delfino Lovett Other Clinician: Referring Tenesha Garza: Vernie Murders Treating Ferne Ellingwood/Extender: Tito Dine in Treatment: 0 Fall Risk Assessment Items Have you had 2 or more falls in the last 12 monthso 0 No Have you had any fall that resulted in injury in the last 12 monthso 0 No FALL RISK ASSESSMENT: History of falling - immediate or within 3 months 0 No Secondary diagnosis 0 No Ambulatory aid None/bed rest/wheelchair/nurse 0 No  Crutches/cane/walker 15 Yes Furniture 0 No IV Access/Saline Lock 0 No Gait/Training Normal/bed rest/immobile 0 No Weak 10 Yes Impaired 0 No Mental Status Oriented to own ability 0 Yes Electronic Signature(s) Signed: 06/20/2018 5:36:16 PM By: Montey Hora Entered By: Montey Hora on 06/20/2018 10:24:35 Carol Ramsey, [name] ([id number]) -------------------------------------------------------------------------------- Foot Assessment Details Patient Name: Carol Ramsey, Carol Ramsey. Date of Service: 06/20/2018 10:15 AM Medical Record Number: [id number] Patient Account Number: [id number] Date of Birth/Sex: 10/25/1945 (73 y.o. Female) Treating RN: Montey Hora Primary Care Moniqua Engebretsen: Cranford Mon, Delfino Lovett Other Clinician: Referring Gavyn Zoss: Vernie Murders Treating Mischele Detter/Extender: Tito Dine in Treatment: 0 Foot Assessment Items Site Locations + = Sensation present, - = Sensation absent, C = Callus, U = Ulcer R = Redness, W = Warmth, M = Maceration, PU = Pre-ulcerative lesion F = Fissure, S = Swelling, D = Dryness Assessment Right: Left: Other Deformity: No No Prior Foot Ulcer: No  No Prior Amputation: No No Charcot Joint: No No Ambulatory Status: Ambulatory With Help Assistance Device: Cane Gait: Buyer, retail Signature(s) Signed: 06/20/2018 5:36:16 PM By: Montey Hora Entered By: Montey Hora on 06/20/2018 10:35:48 Carol Ramsey, [name] ([id number]) -------------------------------------------------------------------------------- Nutrition Risk Assessment Details Patient Name: Carol Ramsey, Carol Ramsey. Date of Service: 06/20/2018 10:15 AM Medical Record Number: [id number] Patient Account Number: [id number] Date of Birth/Sex: Jun 13, 1945 (73 y.o. Female) Treating RN: Montey Hora Primary Care Areil Ottey: Cranford Mon, Delfino Lovett Other Clinician: Referring Shewanda Sharpe: Vernie Murders Treating Ane Conerly/Extender: Tito Dine in Treatment: 0 Height (in): 63 Weight (lbs): 257 Body Mass Index (BMI): 45.5 Nutrition Risk Assessment Items NUTRITION RISK SCREEN: I have an illness or condition that made me change the kind and/or amount of 0 No food I eat I eat fewer than two meals per day 0 No I eat few fruits and vegetables, or milk products 0 No I have three or more drinks of beer, liquor or wine almost every day 0 No I have tooth or mouth problems that make it hard for me to eat 0 No I don't always have enough money to buy the food I need 0 No I eat alone most of the time 0 No I take three or more different prescribed or over-the-counter drugs a day 1 Yes Without wanting to, I have lost or gained 10 pounds in the last six months 0 No I am not always physically able to shop, cook and/or feed myself 0 No Nutrition Protocols Good Risk Protocol 0 No interventions needed Moderate Risk Protocol Electronic Signature(s) Signed: 06/20/2018 5:36:16 PM By: Montey Hora Entered By: Montey Hora on 06/20/2018 10:24:42

## 2018-06-26 DIAGNOSIS — I83893 Varicose veins of bilateral lower extremities with other complications: Secondary | ICD-10-CM | POA: Diagnosis not present

## 2018-06-26 NOTE — Progress Notes (Signed)
Coggeshall, Shontelle L. ([id number]) Visit Report for 06/20/2018 Allergy List Details Patient Name: Carol Ramsey, Carol Ramsey. Date of Service: 06/20/2018 10:15 AM Medical Record Number: [id number] Patient Account Number: [id number] Date of Birth/Sex: 12/31/1945 (73 y.o. Female) Treating RN: Carol Ramsey Primary Care Carol Ramsey: Cranford Mon, Carol Ramsey Other Clinician: Referring Carol Ramsey: Carol Ramsey Treating Nikolis Berent/Extender: Carol Ramsey Weeks in Treatment: 0 Allergies Active Allergies amoxicillin Dilaudid etodolac hydrochlorothiazide hydrocodone Sulfa (Sulfonamide Antibiotics) Allergy Notes Electronic Signature(s) Signed: 06/20/2018 5:36:16 PM By: Carol Ramsey Entered By: Carol Ramsey on 06/20/2018 10:22:59 Carol Ramsey, [name] ([id number]) -------------------------------------------------------------------------------- Arrival Information Details Patient Name: Carol Ramsey, Carol Ramsey. Date of Service: 06/20/2018 10:15 AM Medical Record Number: [id number] Patient Account Number: [id number] Date of Birth/Sex: December 23, 1945 (73 y.o. Female) Treating RN: Carol Ramsey Primary Care Carol Ramsey: Cranford Mon, Carol Ramsey Other Clinician: Referring Carol Ramsey: Carol Ramsey Treating Mabell Esguerra/Extender: Carol Ramsey in Treatment: 0 Visit Information Patient Arrived: Cane Arrival Time: 10:17 Accompanied By: self Transfer Assistance: None Patient Identification Verified: Yes Secondary Verification Process Completed: Yes Patient Has Alerts: Yes Patient Alerts: DMII Electronic Signature(s) Signed: 06/20/2018 5:36:16 PM By: Carol Ramsey Entered By: Carol Ramsey on 06/20/2018 10:21:49 Carol Ramsey, [name] ([id number]) -------------------------------------------------------------------------------- Clinic Level of Care Assessment Details Patient Name: Carol Ramsey, Carol Ramsey. Date of Service: 06/20/2018 10:15 AM Medical Record Number: [id number] Patient Account Number: [id number] Date of Birth/Sex: 02-16-1946 (73 y.o.  Female) Treating RN: Carol Ramsey Primary Care Jamara Vary: Cranford Mon, Carol Ramsey Other Clinician: Referring Carol Ramsey: Carol Ramsey Treating Carol Ramsey/Extender: Carol Ramsey in Treatment: 0 Clinic Level of Care Assessment Items TOOL 4 Quantity Score []  - Use when only an EandM is performed on FOLLOW-UP visit 0 ASSESSMENTS - Nursing Assessment / Reassessment X - Reassessment of Co-morbidities (includes updates in patient status) 1 10 X- 1 5 Reassessment of Adherence to Treatment Plan ASSESSMENTS - Wound and Skin Assessment / Reassessment X - Simple Wound Assessment / Reassessment - one wound 1 5 []  - 0 Complex Wound Assessment / Reassessment - multiple wounds []  - 0 Dermatologic / Skin Assessment (not related to wound area) ASSESSMENTS - Focused Assessment X - Circumferential Edema Measurements - multi extremities 2 5 []  - 0 Nutritional Assessment / Counseling / Intervention []  - 0 Lower Extremity Assessment (monofilament, tuning fork, pulses) []  - 0 Peripheral Arterial Disease Assessment (using hand held doppler) ASSESSMENTS - Ostomy and/or Continence Assessment and Care []  - Incontinence Assessment and Management 0 []  - 0 Ostomy Care Assessment and Management (repouching, etc.) PROCESS - Coordination of Care X - Simple Patient / Family Education for ongoing care 1 15 []  - 0 Complex (extensive) Patient / Family Education for ongoing care X- 1 10 Staff obtains Programmer, systems, Records, Test Results / Process Orders []  - 0 Staff telephones HHA, Nursing Homes / Clarify orders / etc []  - 0 Routine Transfer to another Facility (non-emergent condition) []  - 0 Routine Hospital Admission (non-emergent condition) []  - 0 New Admissions / Biomedical engineer / Ordering NPWT, Apligraf, etc. []  - 0 Emergency Hospital Admission (emergent condition) X- 1 10 Simple Discharge Coordination Carol Ramsey, [name] ([id number]) []  - 0 Complex (extensive) Discharge  Coordination PROCESS - Special Needs []  - Pediatric / Minor Patient Management 0 []  - 0 Isolation Patient Management []  - 0 Hearing / Language / Visual special needs []  - 0 Assessment of Community assistance (transportation, D/C planning, etc.) []  - 0 Additional assistance / Altered mentation []  - 0 Support Surface(s) Assessment (bed, cushion, seat, etc.) INTERVENTIONS - Wound Cleansing / Measurement X - Simple Wound Cleansing -  one wound 1 5 []  - 0 Complex Wound Cleansing - multiple wounds X- 1 5 Wound Imaging (photographs - any number of wounds) []  - 0 Wound Tracing (instead of photographs) X- 1 5 Simple Wound Measurement - one wound []  - 0 Complex Wound Measurement - multiple wounds INTERVENTIONS - Wound Dressings X - Small Wound Dressing one or multiple wounds 1 10 []  - 0 Medium Wound Dressing one or multiple wounds []  - 0 Large Wound Dressing one or multiple wounds []  - 0 Application of Medications - topical []  - 0 Application of Medications - injection INTERVENTIONS - Miscellaneous []  - External ear exam 0 []  - 0 Specimen Collection (cultures, biopsies, blood, body fluids, etc.) []  - 0 Specimen(s) / Culture(s) sent or taken to Lab for analysis []  - 0 Patient Transfer (multiple staff / Civil Service fast streamer / Similar devices) []  - 0 Simple Staple / Suture removal (25 or less) []  - 0 Complex Staple / Suture removal (26 or more) []  - 0 Hypo / Hyperglycemic Management (close monitor of Blood Glucose) []  - 0 Ankle / Brachial Index (ABI) - do not check if billed separately X- 1 5 Vital Signs Carol Ramsey, [name] ([id number]) Has the patient been seen at the hospital within the last three years: Yes Total Score: 95 Level Of Care: New/Established - Level 3 Electronic Signature(s) Signed: 06/26/2018 11:13:25 AM By: Carol Ramsey Entered By: Carol Ramsey on 06/20/2018 11:15:43 Carol Ramsey, [name]  ([id number]) -------------------------------------------------------------------------------- Encounter Discharge Information Details Patient Name: Carol Ramsey. Date of Service: 06/20/2018 10:15 AM Medical Record Number: [id number] Patient Account Number: [id number] Date of Birth/Sex: 1946/04/20 (73 y.o. Female) Treating RN: Carol Ramsey Primary Care Carletha Dawn: Cranford Mon, Carol Ramsey Other Clinician: Referring Meygan Kyser: Carol Ramsey Treating Yariana Hoaglund/Extender: Carol Ramsey in Treatment: 0 Encounter Discharge Information Items Discharge Condition: Stable Ambulatory Status: Cane Discharge Destination: Home Transportation: Private Auto Accompanied By: self Schedule Follow-up Appointment: Yes Clinical Summary of Care: Electronic Signature(s) Signed: 06/26/2018 11:13:25 AM By: Carol Ramsey Entered By: Carol Ramsey on 06/20/2018 11:30:16 Self, Kalen L. ([id number]) -------------------------------------------------------------------------------- Lower Extremity Assessment Details Patient Name: MALASIA, TORAIN. Date of Service: 06/20/2018 10:15 AM Medical Record Number: [id number] Patient Account Number: [id number] Date of Birth/Sex: Oct 11, 1945 (73 y.o. Female) Treating RN: Carol Ramsey Primary Care Angelmarie Ponzo: Cranford Mon, Carol Ramsey Other Clinician: Referring Keanan Melander: Carol Ramsey Treating Azul Coffie/Extender: Carol Ramsey in Treatment: 0 Edema Assessment Assessed: [Left: No] [Right: No] Edema: [Left: Yes] [Right: Yes] Calf Left: Right: Point of Measurement: 34 cm From Medial Instep 55.5 cm 54.8 cm Ankle Left: Right: Point of Measurement: 10 cm From Medial Instep 30.8 cm 30.2 cm Vascular Assessment Pulses: Dorsalis Pedis Palpable: [Left:Yes] [Right:Yes] Doppler Audible: [Left:Yes] [Right:Yes] Posterior Tibial Palpable: [Left:No] [Right:No] Doppler Audible: [Left:Yes] [Right:Yes] Extremity colors, hair growth, and conditions: Extremity Color:  [Left:Normal] [Right:Normal] Hair Growth on Extremity: [Left:Yes] [Right:Yes] Temperature of Extremity: [Left:Warm] [Right:Warm] Capillary Refill: [Left:< 3 seconds] [Right:< 3 seconds] Blood Pressure: Brachial: [Right:164] Dorsalis Pedis: [Left:Dorsalis Pedis: 158] Ankle: Posterior Tibial: [Left:Posterior Tibial:] [Right:0.96] Toe Nail Assessment Left: Right: Thick: No No Discolored: No No Deformed: No No Improper Length and Hygiene: No No Notes ABI [place] LEFT >220 Electronic Signature(s) Signed: 06/20/2018 5:36:16 PM By: Orland Jarred, Jillianna L. ([id number]) Entered By: Carol Ramsey on 06/20/2018 10:47:46 Carol Ramsey, [name] ([id number]) -------------------------------------------------------------------------------- Multi Wound Chart Details Patient Name: Carol Ramsey, RIEDE. Date of Service: 06/20/2018 10:15 AM Medical Record Number: [id number] Patient Account Number: [id number] Date of Birth/Sex: [birth year] (73 y.o. Female) Treating RN: Oretha Ellis,  Carmell Austria Primary Care Alfredo Spong: Cranford Mon, Carol Ramsey Other Clinician: Referring Srinivas Lippman: Carol Ramsey Treating Demetrio Leighty/Extender: Carol Ramsey in Treatment: 0 Vital Signs Height(in): 63 Pulse(bpm): 21 Weight(lbs): 257 Blood Pressure(mmHg): 158/67 Body Mass Index(BMI): 46 Temperature(F): 98.1 Respiratory Rate 16 (breaths/min): Photos: [N/A:N/A] Wound Location: Left Lower Leg - Posterior N/A N/A Wounding Event: Gradually Appeared N/A N/A Primary Etiology: Diabetic Wound/Ulcer of the N/A N/A Lower Extremity Secondary Etiology: Lymphedema N/A N/A Comorbid History: Lymphedema, Hypertension, N/A N/A Peripheral Venous Disease, Type II Diabetes, End Stage Renal Disease, Osteoarthritis, Neuropathy, Received Radiation Date Acquired: 02/26/2018 N/A N/A Weeks of Treatment: 0 N/A N/A Wound Status: Open N/A N/A Measurements L x W x D 3.5x5.5x0.1 N/A N/A (cm) Area (cm) : 15.119 N/A N/A Volume (cm) : 1.512 N/A  N/A Classification: Grade 1 N/A N/A Exudate Amount: Large N/A N/A Exudate Type: Serous N/A N/A Exudate Color: amber N/A N/A Wound Margin: Indistinct, nonvisible N/A N/A Granulation Amount: Medium (34-66%) N/A N/A Granulation Quality: Pink N/A N/A Necrotic Amount: Medium (34-66%) N/A N/A Exposed Structures: Fat Layer (Subcutaneous N/A N/A Tissue) Exposed: Yes Fascia: No Claassen, Tujuana L. ([id number]) Tendon: No Muscle: No Joint: No Bone: No Epithelialization: None N/A N/A Periwound Skin Texture: Excoriation: No N/A N/A Induration: No Callus: No Crepitus: No Rash: No Scarring: No Periwound Skin Moisture: Maceration: No N/A N/A Dry/Scaly: No Periwound Skin Color: Atrophie Blanche: No N/A N/A Cyanosis: No Ecchymosis: No Erythema: No Hemosiderin Staining: No Mottled: No Pallor: No Rubor: No Temperature: No Abnormality N/A N/A Tenderness on Palpation: Yes N/A N/A Wound Preparation: Ulcer Cleansing: N/A N/A Rinsed/Irrigated with Saline Topical Anesthetic Applied: Other: lidocaine 4% Treatment Notes Wound #1 (Left, Posterior Lower Leg) Notes Silver alginate, drawtec, ABD, 3-Layer Left leg Electronic Signature(s) Signed: 06/20/2018 6:18:32 PM By: Linton Ham MD Entered By: Linton Ham on 06/20/2018 12:33:15 Baumbach, Kiley L. ([id number]) -------------------------------------------------------------------------------- [place] Details Patient Name: Carol Ramsey, VOSLER. Date of Service: 06/20/2018 10:15 AM Medical Record Number: [id number] Patient Account Number: [id number] Date of Birth/Sex: [birth year] (73 y.o. Female) Treating RN: Carol Ramsey Primary Care Jawann Urbani: Cranford Mon, Carol Ramsey Other Clinician: Referring Ramzy Cappelletti: Carol Ramsey Treating Zharia Conrow/Extender: Carol Ramsey in Treatment: 0 Active Inactive Abuse / Safety / Falls / Self Care Management Nursing Diagnoses: Impaired physical mobility Potential for  falls Goals: Patient will remain injury free related to falls Date Initiated: 06/20/2018 Target Resolution Date: 07/19/2018 Goal Status: Active Interventions: Assess fall risk on admission and as needed Notes: Orientation to the Wound Care Program Nursing Diagnoses: Knowledge deficit related to the wound healing center program Goals: Patient/caregiver will verbalize understanding of the [facility] Program Date Initiated: 06/20/2018 Target Resolution Date: 06/20/2018 Goal Status: Active Interventions: Provide education on orientation to the wound center Notes: Wound/Skin Impairment Nursing Diagnoses: Impaired tissue integrity Goals: Ulcer/skin breakdown will have a volume reduction of 30% by week 4 Date Initiated: 06/20/2018 Target Resolution Date: 07/19/2018 Goal Status: Active Interventions: Longoria, Berklee L. ([id number]) Assess patient/caregiver ability to obtain necessary supplies Assess patient/caregiver ability to perform ulcer/skin care regimen upon admission and as needed Assess ulceration(s) every visit Provide education on ulcer and skin care Notes: Electronic Signature(s) Signed: 06/20/2018 6:01:32 PM By: Gretta Cool, BSN, RN, CWS, Kim RN, BSN Signed: 06/26/2018 11:13:25 AM By: Carol Ramsey Entered By: Gretta Cool, BSN, RN, CWS, Kim on 06/20/2018 18:01:32 [place]house, Temple ([id number]) -------------------------------------------------------------------------------- Pain Assessment Details Patient Name: JAKALA, HERFORD. Date of Service: 06/20/2018 10:15 AM Medical Record Number: [id number] Patient Account Number: [id number] Date of Birth/Sex: 10-03-1945 (72  y.o. Female) Treating RN: Carol Ramsey Primary Care Nelli Swalley: Cranford Mon, Carol Ramsey Other Clinician: Referring Louine Tenpenny: Carol Ramsey Treating Debbera Wolken/Extender: Carol Ramsey in Treatment: 0 Active Problems Location of Pain Severity and Description of Pain Patient Has Paino No Site Locations Pain  Management and Medication Current Pain Management: Electronic Signature(s) Signed: 06/20/2018 2:49:55 PM By: Paulla Fore, RRT, CHT Signed: 06/20/2018 6:42:35 PM By: Gretta Cool, BSN, RN, CWS, Kim RN, BSN Entered By: Lorine Bears on 06/20/2018 10:18:54 Sprung, Willena L. ([id number]) -------------------------------------------------------------------------------- Patient/Caregiver Education Details Patient Name: DELAYZA, LUNGREN. Date of Service: 06/20/2018 10:15 AM Medical Record Number: [id number] Patient Account Number: [id number] Date of Birth/Gender: 07/06/1945 (73 y.o. Female) Treating RN: Carol Ramsey Primary Care Physician: Cranford Mon, Carol Ramsey Other Clinician: Referring Physician: Vernie Ramsey Treating Physician/Extender: Carol Ramsey in Treatment: 0 Education Assessment Education Provided To: Patient Education Topics Provided Wound/Skin Impairment: Handouts: Caring for Your Ulcer Methods: Demonstration, Explain/Verbal Responses: State content correctly Electronic Signature(s) Signed: 06/26/2018 11:13:25 AM By: Carol Ramsey Entered By: Carol Ramsey on 06/20/2018 11:30:23 Dore, Nailah L. ([id number]) -------------------------------------------------------------------------------- Wound Assessment Details Patient Name: OLETHA, TOLSON. Date of Service: 06/20/2018 10:15 AM Medical Record Number: [id number] Patient Account Number: [id number] Date of Birth/Sex: 1945/07/22 (73 y.o. Female) Treating RN: Carol Ramsey Primary Care Jennavie Martinek: Cranford Mon, Carol Ramsey Other Clinician: Referring Akisha Sturgill: Carol Ramsey Treating Fantasha Daniele/Extender: Carol Ramsey in Treatment: 0 Wound Status Wound Number: 1 Primary Diabetic Wound/Ulcer of the Lower Extremity Etiology: Wound Location: Left Lower Leg - Posterior Secondary Lymphedema Wounding Event: Gradually Appeared Etiology: Date Acquired: 02/26/2018 Wound Open Weeks Of  Treatment: 0 Status: Clustered Wound: No Comorbid Lymphedema, Hypertension, Peripheral Venous History: Disease, Type II Diabetes, End Stage Renal Disease, Osteoarthritis, Neuropathy, Received Radiation Photos Photo Uploaded By: Carol Ramsey on 06/20/2018 11:47:48 Wound Measurements Length: (cm) 3.5 Width: (cm) 5.5 Depth: (cm) 0.1 Area: (cm) 15.119 Volume: (cm) 1.512 % Reduction in Area: % Reduction in Volume: Epithelialization: None Tunneling: No Undermining: No Wound Description Classification: Grade 1 Wound Margin: Indistinct, nonvisible Exudate Amount: Large Exudate Type: Serous Exudate Color: amber Foul Odor After Cleansing: No Slough/Fibrino Yes Wound Bed Granulation Amount: Medium (34-66%) Exposed Structure Granulation Quality: Pink Fascia Exposed: No Necrotic Amount: Medium (34-66%) Fat Layer (Subcutaneous Tissue) Exposed: Yes Necrotic Quality: Adherent Slough Tendon Exposed: No Muscle Exposed: No Joint Exposed: No Wenke, Missi L. ([id number]) Bone Exposed: No Periwound Skin Texture Texture Color No Abnormalities Noted: No No Abnormalities Noted: No Callus: No Atrophie Blanche: No Crepitus: No Cyanosis: No Excoriation: No Ecchymosis: No Induration: No Erythema: No Rash: No Hemosiderin Staining: No Scarring: No Mottled: No Pallor: No Moisture Rubor: No No Abnormalities Noted: No Dry / Scaly: No Temperature / Pain Maceration: No Temperature: No Abnormality Tenderness on Palpation: Yes Wound Preparation Ulcer Cleansing: Rinsed/Irrigated with Saline Topical Anesthetic Applied: Other: lidocaine 4%, Treatment Notes Wound #1 (Left, Posterior Lower Leg) Notes Silver alginate, drawtec, ABD, 3-Layer Left leg Electronic Signature(s) Signed: 06/20/2018 5:36:16 PM By: Carol Ramsey Entered By: Carol Ramsey on 06/20/2018 10:37:02 Bezio, Vaughn L.  ([id number]) -------------------------------------------------------------------------------- Vitals Details Patient Name: OLGA, BOURBEAU. Date of Service: 06/20/2018 10:15 AM Medical Record Number: [id number] Patient Account Number: [id number] Date of Birth/Sex: Tekeisha 10, 1947 (73 y.o. Female) Treating RN: Carol Ramsey Primary Care Jordie Skalsky: Cranford Mon, Carol Ramsey Other Clinician: Referring Kimesha Claxton: Carol Ramsey Treating Benny Deutschman/Extender: Carol Ramsey in Treatment: 0 Vital Signs Time Taken: 10:19 Temperature (F): 98.1 Height (in): 63 Pulse (bpm): 66 Source: Stated Respiratory Rate (breaths/min): 16 Weight (lbs): 257  Blood Pressure (mmHg): 158/67 Source: Measured Reference Range: 80 - 120 mg / dl Body Mass Index (BMI): 45.5 Electronic Signature(s) Signed: 06/20/2018 2:49:55 PM By: Lorine Bears RCP, RRT, CHT Entered By: Lorine Bears on 06/20/2018 10:19:33

## 2018-06-26 NOTE — Progress Notes (Signed)
Carol Ramsey. ([id number]) Visit Report for 06/20/2018 Chief Complaint Document Details Patient Name: Carol Ramsey, Carol Ramsey. Date of Service: 06/20/2018 10:15 AM Medical Record Number: [id number] Patient Account Number: [id number] Date of Birth/Sex: 07/14/1945 (73 y.o. Female) Treating RN: Carol Ramsey Primary Care Provider: Cranford Mon, Delfino Lovett Other Clinician: Referring Provider: Vernie Murders Treating Provider/Extender: Carol Ramsey in Treatment: 0 Information Obtained from: Patient Chief Complaint 06/20/2018; patient is here for review of a wound on her left posterior calf Electronic Signature(s) Signed: 06/20/2018 6:18:32 PM By: Carol Ham MD Entered By: Carol Ramsey on 06/20/2018 12:34:14 Carol Ramsey. ([id number]) -------------------------------------------------------------------------------- HPI Details Patient Name: Carol Ramsey, Carol Ramsey. Date of Service: 06/20/2018 10:15 AM Medical Record Number: [id number] Patient Account Number: [id number] Date of Birth/Sex: 12-07-1945 (73 y.o. Female) Treating RN: Carol Ramsey Primary Care Provider: Cranford Mon, Delfino Lovett Other Clinician: Referring Provider: Vernie Murders Treating Provider/Extender: Carol Ramsey in Treatment: 0 History of Present Illness HPI Description: ADMISSION 06/20/2018 This is a 73 year old woman who has known problems with chronic venous insufficiency and bilateral lower extremity edema. She is also a type II diabetic with recent hemoglobin A1c of 7.2. She tells me in the last 3 to 4 months she is developed a weeping nonhealing area on the left posterior calf. There was no obvious trauma here. She thought she might of had a skin tag on the back of this and was scratching and wonder is if this actually caused the area. She is simply been putting dry gauze on the top of this. She has not been using compression stockings and does not think she would be able to get these on. She does have compression  pumps and she says she uses them once a day on most days. She was seen at her primary doctor's office on April 10, 2018 at which time she was felt to have wound cellulitis. She was followed up a week later and given Lasix 20 mg. Culture of the wound grew Pseudomonas and staph and was given a combination of doxycycline transitioning to Cipro. She tells me she follows at a vein clinic in [place]. She had some form of what sounds like sclerotherapy on the medial right upper thigh just above her knee yesterday. Past medical history includes ductal carcinoma of the right breast, type 2 diabetes, venous insufficiency with bilateral lower extremity edema, sciatica, left total knee replacement, hyperthyroidism and hyperlipidemia ABIs on the right were 0.96 and noncompressible on the left Electronic Signature(s) Signed: 06/20/2018 6:18:32 PM By: Carol Ham MD Entered By: Carol Ramsey on 06/20/2018 12:43:50 Carol Ramsey. ([id number]) -------------------------------------------------------------------------------- Physical Exam Details Patient Name: Carol Ramsey, Carol Ramsey. Date of Service: 06/20/2018 10:15 AM Medical Record Number: [id number] Patient Account Number: [id number] Date of Birth/Sex: 1945/10/02 (73 y.o. Female) Treating RN: Carol Ramsey Primary Care Provider: Cranford Mon, Delfino Lovett Other Clinician: Referring Provider: Vernie Murders Treating Provider/Extender: Carol Ramsey in Treatment: 0 Constitutional Patient is hypertensive.. Pulse regular and within target range for patient.[name] Respirations regular, non-labored and within target range.. Temperature is normal and within the target range for the patient.[name] appears in no distress. Eyes Conjunctivae clear. No discharge. Respiratory Respiratory effort is easy and symmetric bilaterally. Rate is normal at rest and on room air.. Bilateral breath sounds are clear and equal in all lobes with no wheezes, rales or  rhonchi.. Cardiovascular Sounds are normal no murmurs jugular venous pressure is not elevated no evidence of congestive heart failure. Pedal pulses palpable and strong bilaterally.. Bilateral lower extremity lymphedema which  is poorly controlled. Gastrointestinal (GI) Abdomen is soft and non-distended without masses or tenderness. Bowel sounds active in all quadrants.. Lymphatic None palpable in the popliteal or inguinal area on the left. Integumentary (Hair, Skin) Other than the lymphedema there is no major cutaneous findings. Psychiatric No evidence of depression, anxiety, or agitation. Calm, cooperative, and communicative. Appropriate interactions and affect.. Notes Wound exam; the patient has a superficial but weeping open area on the left posterior calf just below the midline. There is no evidence of surrounding infection. There is weeping edema fluid visible at the bedside. The area itself however is superficial there is no depth. She has very poorly controlled lymphedema oShe had a minor surgical procedure on the right anterior thigh apparently sclerotherapy of some form yesterday in [place]. This area looks satisfactorily healed Electronic Signature(s) Signed: 06/20/2018 6:18:32 PM By: Carol Ham MD Entered By: Carol Ramsey on 06/20/2018 12:40:50 [name], [name] ([id number]) -------------------------------------------------------------------------------- Physician Orders Details Patient Name: Carol Ramsey, DELANGE. Date of Service: 06/20/2018 10:15 AM Medical Record Number: [id number] Patient Account Number: [id number] Date of Birth/Sex: June 27, 1945 (73 y.o. Female) Treating RN: Harold Barban Primary Care Provider: [name] Other Clinician: Referring Provider: Vernie Murders Treating Provider/Extender: Carol Ramsey in Treatment: 0 Verbal / Phone Orders: No Diagnosis Coding Wound Cleansing o May shower with protection. - Cast Protector Primary Wound  Dressing Wound #1 Left,Posterior Lower Leg o Silver Alginate Secondary Dressing Wound #1 Left,Posterior Lower Leg o ABD pad - Drawtex Follow-up Appointments Wound #1 Left,Posterior Lower Leg o Return Appointment in 1 week. Edema Control Wound #1 Left,Posterior Lower Leg o 3 Layer Compression System - Left Lower Extremity Additional Orders / Instructions Wound #1 Left,Posterior Lower Leg o Other: - Use pumps 2 times per day, every day Electronic Signature(s) Signed: 06/20/2018 6:18:32 PM By: Carol Ham MD Signed: 06/26/2018 11:13:25 AM By: Harold Barban Entered By: Harold Barban on 06/20/2018 11:20:32 Mcphie, Calvert ([id number]) -------------------------------------------------------------------------------- Problem List Details Patient Name: Carol Ramsey, Carol Ramsey. Date of Service: 06/20/2018 10:15 AM Medical Record Number: [id number] Patient Account Number: [id number] Date of Birth/Sex: 1945/07/24 (73 y.o. 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 ([id number]) -------------------------------------------------------------------------------- Progress Note Details Patient Name: Carol Ramsey, Carol Ramsey. Date of Service: 06/20/2018 10:15 AM Medical Record Number: [id number] Patient Account Number:  [id number] Date of Birth/Sex: 06-14-1945 (73 y.o. Female) Treating RN: Carol Ramsey Primary Care Provider: Cranford Mon, Delfino Lovett Other Clinician: Referring Provider: Vernie Murders Treating Provider/Extender: Carol Ramsey in Treatment: 0 Subjective Chief Complaint Information obtained from Patient 06/20/2018; patient is here for review of a wound on her left posterior calf History of Present Illness (HPI) ADMISSION 06/20/2018 This is a 73 year old woman who has known problems with chronic venous insufficiency and bilateral lower extremity edema. She is also a type II diabetic with recent hemoglobin A1c of 7.2. She tells me in the last 3 to 4 months she is developed a weeping nonhealing area on the left posterior calf. There was no  obvious trauma here. She thought she might of had a skin tag on the back of this and was scratching and wonder is if this actually caused the area. She is simply been putting dry gauze on the top of this. She has not been using compression stockings and does not think she would be able to get these on. She does have compression pumps and she says she uses them once a day on most days. She was seen at her primary doctor's office on April 10, 2018 at which time she was felt to have wound cellulitis. She was followed up a week later and given Lasix 20 mg. Culture of the wound grew Pseudomonas and staph and was given a combination of doxycycline transitioning to Cipro. She tells me she follows at a vein clinic in [place]. She had some form of what sounds like sclerotherapy on the medial right upper thigh just above her knee yesterday. Past medical history includes ductal carcinoma of the right breast, type 2 diabetes, venous insufficiency with bilateral lower extremity edema, sciatica, left total knee replacement, hyperthyroidism and hyperlipidemia ABIs on the right were 0.96 and noncompressible on the left Wound History Patient presents with 1 open wound  that has been present for approximately 4 months. Patient has been treating wound in the following manner: dry gauze. Laboratory tests have not been performed in the last month. Patient reportedly has not tested positive for an antibiotic resistant organism. Patient reportedly has not tested positive for osteomyelitis. Patient reportedly has had testing performed to evaluate circulation in the legs. Patient experiences the following problems associated with their wounds: swelling. Patient History Information obtained from Patient. Allergies amoxicillin, Dilaudid, etodolac, hydrochlorothiazide, hydrocodone, Sulfa (Sulfonamide Antibiotics) Family History Cancer - Siblings,Mother, Diabetes - Siblings,Maternal Grandparents, Heart Disease - Father, Hypertension - Father, Lung Disease - Siblings,Mother, Stroke - Father,Siblings, No family history of Hereditary Spherocytosis, Kidney Disease, Seizures, Thyroid Problems, Tuberculosis. Carol Ramsey, Carol Ramsey. ([id number]) Social History Never smoker, Marital Status - Married, Alcohol Use - Never, Drug Use - No History, Caffeine Use - Rarely. Medical History Eyes Denies history of Cataracts, Glaucoma, Optic Neuritis Ear/Nose/Mouth/Throat Denies history of Chronic sinus problems/congestion, Middle ear problems Hematologic/Lymphatic Patient has history of Lymphedema Denies history of Anemia, Hemophilia, Human Immunodeficiency Virus, Sickle Cell Disease Respiratory Denies history of Aspiration, Asthma, Chronic Obstructive Pulmonary Disease (COPD), Pneumothorax, Sleep Apnea, Tuberculosis Cardiovascular Patient has history of Hypertension, Peripheral Venous Disease Denies history of Angina, Arrhythmia, Congestive Heart Failure, Coronary Artery Disease, Deep Vein Thrombosis, Hypotension, Myocardial Infarction, Peripheral Arterial Disease, Phlebitis, Vasculitis Gastrointestinal Denies history of Cirrhosis , Colitis, Crohn s, Hepatitis A, Hepatitis B, Hepatitis  C Endocrine Patient has history of Type II Diabetes Denies history of Type I Diabetes Genitourinary Patient has history 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 Blood sugar is not tested. Medical And Surgical History Notes Musculoskeletal chronic lower back pain, obesity Oncologic ductal carcinoma of right breast - removed 18 months ago Review of Systems (ROS) Constitutional Symptoms (General Health) Denies complaints or symptoms of Fatigue, Fever, Chills, Marked Weight Change. Eyes Complains or has symptoms of Glasses /  Contacts - glasses. Denies complaints or symptoms of Dry Eyes, Vision Changes. Ear/Nose/Mouth/Throat Denies complaints or symptoms of Difficult clearing ears, Sinusitis. Hematologic/Lymphatic Denies complaints or symptoms of Bleeding / Clotting Disorders, Human Immunodeficiency Virus. Respiratory Denies complaints or symptoms of Chronic or frequent coughs, Shortness of Breath. Carol Ramsey, Carol Ramsey. ([id number]) Cardiovascular Complains or has symptoms of LE edema. Denies complaints or symptoms of Chest pain. Gastrointestinal Denies complaints or symptoms of Frequent diarrhea, Nausea, Vomiting. Endocrine Denies complaints or symptoms of Hepatitis, Thyroid disease, Polydypsia (Excessive Thirst). Genitourinary Complains or has symptoms of Kidney failure/ Dialysis - CKD stage 3. Denies complaints or symptoms of  Incontinence/dribbling. Immunological Denies complaints or symptoms of Hives, Itching. Integumentary (Skin) Complains or has symptoms of Wounds, Swelling. Denies complaints or symptoms of Bleeding or bruising tendency, Breakdown. Musculoskeletal Denies complaints or symptoms of Muscle Pain, Muscle Weakness. Neurologic Denies complaints or symptoms of Numbness/parasthesias, Focal/Weakness. Psychiatric Denies complaints or symptoms of Anxiety, Claustrophobia. Objective Constitutional Patient is hypertensive.. Pulse regular and within target range for patient.[name] Respirations regular, non-labored and within target range.. Temperature is normal and within the target range for the patient.[name] appears in no distress. Vitals Time Taken: 10:19 AM, Height: 63 in, Source: Stated, Weight: 257 lbs, Source: Measured, BMI: 45.5, Temperature: 98.1 F, Pulse: 66 bpm, Respiratory Rate: 16 breaths/min, Blood Pressure: 158/67 mmHg. Eyes Conjunctivae clear. No discharge. Respiratory Respiratory effort is easy and symmetric bilaterally. Rate is normal at rest and on room air.. Bilateral breath sounds are clear and equal in all lobes with no wheezes, rales or rhonchi.. Cardiovascular Sounds are normal no murmurs jugular venous pressure is not elevated no evidence of congestive heart failure. Pedal pulses palpable and strong bilaterally.. Bilateral lower extremity lymphedema which is poorly controlled. Gastrointestinal (GI) Abdomen is soft and non-distended without masses or tenderness. Bowel sounds active in all quadrants.. Lymphatic None palpable in the popliteal or inguinal area on the left. Psychiatric No evidence of depression, anxiety, or agitation. Calm, cooperative, and communicative. Appropriate interactions and affect.Carol Ramsey, Carol Ramsey. ([id number]) General Notes: Wound exam; the patient has a superficial but weeping open area on the left posterior calf just below the midline. There is no evidence  of surrounding infection. There is weeping edema fluid visible at the bedside. The area itself however is superficial there is no depth. She has very poorly controlled lymphedema She had a minor surgical procedure on the right anterior thigh apparently sclerotherapy of some form yesterday in [place]. This area looks satisfactorily healed Integumentary (Hair, Skin) Other than the lymphedema there is no major cutaneous findings. Wound #1 status is Open. Original cause of wound was Gradually Appeared. The wound is located on the Left,Posterior Lower Leg. The wound measures 3.5cm length x 5.5cm width x 0.1cm depth; 15.119cm^2 area and 1.512cm^3 volume. There is Fat Layer (Subcutaneous Tissue) Exposed exposed. There is no tunneling or undermining noted. There is a large amount of serous drainage noted. The wound margin is indistinct and nonvisible. There is medium (34-66%) pink granulation within the wound bed. There is a medium (34-66%) amount of necrotic tissue within the wound bed including Adherent Slough. The periwound skin appearance did not exhibit: Callus, Crepitus, Excoriation, Induration, Rash, Scarring, Dry/Scaly, Maceration, Atrophie Blanche, Cyanosis, Ecchymosis, Hemosiderin Staining, Mottled, Pallor, Rubor, Erythema. Periwound temperature was noted as No Abnormality. The periwound has tenderness on palpation. Assessment Active Problems ICD-10 Non-pressure chronic ulcer of right calf limited to breakdown of skin Lymphedema, not elsewhere classified Chronic venous hypertension (idiopathic) with ulcer of left lower extremity  Plan Wound Cleansing: May shower with protection. - Cast Protector Primary Wound Dressing: Wound #1 Left,Posterior Lower Leg: Silver Alginate Secondary Dressing: Wound #1 Left,Posterior Lower Leg: ABD pad - Drawtex Follow-up Appointments: Wound #1 Left,Posterior Lower Leg: Return Appointment in 1 week. Edema Control: Wound #1 Left,Posterior Lower Leg: 3  Layer Compression System - Left Lower Extremity Additional Orders / Instructions: Wound #1 Left,Posterior Lower Leg: Other: - Use pumps 2 times per day, every day Carol Ramsey, Carol Ramsey. ([id number]) 1. Severe lymphedema bilaterally with a wound on her left posterior calf. This is no doubt secondary to chronic venous insufficiency given her history. 2. We use silver alginate/drawtex/ABDs under 3 layer compression. I think she would probably tolerate four 4-layer compression. Although her ABIs were noncompressible on the left her peripheral pulses were palpable. ABI was 0.96 on the right 3. I have asked her to use her external compression pumps twice a day. As long as there are no pain it is okay to do this over the wrap we have placed on the left leg 4. She is going to need some form of external compression garment. She apparently cannot put on standard stockings and does not have any assistance Electronic Signature(s) Signed: 06/20/2018 12:44:19 PM By: Carol Ham MD Entered By: Carol Ramsey on 06/20/2018 12:44:18 Carol Ramsey, Carol Ramsey ([id number]) -------------------------------------------------------------------------------- ROS/PFSH Details Patient Name: Carol Ramsey, Carol Ramsey. Date of Service: 06/20/2018 10:15 AM Medical Record Number: [id number] Patient Account Number: [id number] Date of Birth/Sex: 01-29-1946 (73 y.o. Female) Treating RN: Montey Hora Primary Care Provider: Cranford Mon, Delfino Lovett Other Clinician: Referring Provider: Vernie Murders Treating Provider/Extender: Carol Ramsey in Treatment: 0 Information Obtained From Patient Wound History Do you currently have one or more open woundso Yes How many open wounds do you currently haveo 1 Approximately how long have you had your woundso 4 months How have you been treating your wound(s) until nowo dry gauze Has your wound(s) ever healed and then re-openedo No Have you had any lab work done in the past montho No Have you  tested positive for an antibiotic resistant organism (MRSA, VRE)o No Have you tested positive for osteomyelitis (bone infection)o No Have you had any tests for circulation on your legso Yes Who ordered the testo [facility] Have you had other problems associated with your woundso Swelling Constitutional Symptoms (General Health) Complaints and Symptoms: Negative for: Fatigue; Fever; Chills; Marked Weight Change Eyes Complaints and Symptoms: Positive for: Glasses / Contacts - glasses Negative for: Dry Eyes; Vision Changes Medical History: Negative for: Cataracts; Glaucoma; Optic Neuritis Ear/Nose/Mouth/Throat Complaints and Symptoms: Negative for: Difficult clearing ears; Sinusitis Medical History: Negative for: Chronic sinus problems/congestion; Middle ear problems Hematologic/Lymphatic Complaints and Symptoms: Negative for: Bleeding / Clotting Disorders; Human Immunodeficiency Virus Medical History: Positive for: Lymphedema Negative for: Anemia; Hemophilia; Human Immunodeficiency Virus; Sickle Cell Disease Respiratory [place], [place] ([id number]) Complaints and Symptoms: Negative for: Chronic or frequent coughs; Shortness of Breath Medical History: Negative for: Aspiration; Asthma; Chronic Obstructive Pulmonary Disease (COPD); Pneumothorax; Sleep Apnea; Tuberculosis Cardiovascular Complaints and Symptoms: Positive for: LE edema Negative for: Chest pain Medical History: Positive for: Hypertension; Peripheral Venous Disease Negative for: Angina; Arrhythmia; Congestive Heart Failure; Coronary Artery Disease; Deep Vein Thrombosis; Hypotension; Myocardial Infarction; Peripheral Arterial Disease; Phlebitis; Vasculitis Gastrointestinal Complaints and Symptoms: Negative for: Frequent diarrhea; Nausea; Vomiting Medical History: Negative for: Cirrhosis ; Colitis; Crohnos; Hepatitis A; Hepatitis B; Hepatitis C Endocrine Complaints and Symptoms: Negative for: Hepatitis;  Thyroid disease; Polydypsia (Excessive Thirst) Medical History: Positive for: Type  II Diabetes Negative for: Type I Diabetes Treated with: Oral agents Blood sugar tested every day: No Genitourinary Complaints and Symptoms: Positive for: Kidney failure/ Dialysis - CKD stage 3 Negative for: Incontinence/dribbling Medical History: Positive for: 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 ([id number]) Medical History: Negative for: History of Burn; History of pressure wounds Musculoskeletal Complaints and Symptoms: Negative for: Muscle Pain; Muscle Weakness Medical History: Positive for: Osteoarthritis Negative for: Gout; Rheumatoid Arthritis; Osteomyelitis Past Medical History Notes: chronic lower back pain, obesity Neurologic Complaints and Symptoms: Negative for: Numbness/parasthesias; Focal/Weakness Medical History: Positive for: Neuropathy Negative for: Dementia; Quadriplegia; Paraplegia; Seizure Disorder Psychiatric Complaints and Symptoms: Negative for: Anxiety; Claustrophobia Medical History: Negative for: Anorexia/bulimia; Confinement Anxiety Oncologic Medical History: Positive for: Received Radiation Past Medical History Notes: ductal carcinoma of right breast - removed 18 months ago Immunizations Pneumococcal Vaccine: Received Pneumococcal Vaccination: Yes Immunization Notes: up to date Implantable Devices Family and Social History Cancer: Yes - Siblings,Mother; Diabetes: Yes - Siblings,Maternal Grandparents; Heart Disease: Yes - Father; Hereditary Spherocytosis: No; Hypertension: Yes - Father; Kidney Disease: No; Lung Disease: Yes - Siblings,Mother; Seizures: No; Stroke:  Yes - Father,Siblings; Thyroid Problems: No; Tuberculosis: No; Never smoker; Marital Status - Married; Alcohol Use: Never; Drug Use: No History; Caffeine Use: Rarely; Financial Concerns: No; Food, Clothing or Shelter Needs: No; Support System Lacking: No; Transportation Concerns: No; Advanced Directives: No; Patient does not want information on Visual merchandiser Signature(s) Signed: 06/20/2018 5:36:16 PM By: Montey Hora Signed: 06/20/2018 6:18:32 PM By: Carol Ham MD Lein, Manda Ramsey. ([id number]) Entered By: Montey Hora on 06/20/2018 10:34:18 Stemm, Eleonor Ramsey. ([id number]) -------------------------------------------------------------------------------- SuperBill Details Patient Name: KATHERLEEN, FOLKES. Date of Service: 06/20/2018 Medical Record Number: [id number] Patient Account Number: [id number] Date of Birth/Sex: Oct 17, 1945 (73 y.o. Female) Treating RN: Carol Ramsey Primary Care Provider: Cranford Mon, Delfino Lovett Other Clinician: Referring Provider: Vernie Murders Treating Provider/Extender: Carol Ramsey in Treatment: 0 Diagnosis Coding ICD-10 Codes Code Description [id number] Non-pressure chronic ulcer of right calf limited to breakdown of skin I89.0 Lymphedema, not elsewhere classified I87.312 Chronic venous hypertension (idiopathic) with ulcer of left lower extremity Facility Procedures CPT4 Code: 14239532 Description: [place] VISIT-LEV 3 EST PT Modifier: Quantity: 1 CPT4 Code: 02334356 Description: (Facility Use Only) 29581LT - [facility] LWR LT LEG Modifier: Quantity: 1 Physician Procedures CPT4 Code: 8616837 Description: 29021 - WC PHYS LEVEL 4 - NEW PT ICD-10 Diagnosis Description J15.520 Non-pressure chronic ulcer of right calf limited to breakdown I89.0 Lymphedema, not elsewhere classified I87.312 Chronic venous hypertension (idiopathic) with ulcer of  left Ramsey Modifier: of skin ower extremity Quantity: 1 Electronic  Signature(s) Signed: 06/20/2018 6:02:50 PM By: Gretta Cool, BSN, RN, CWS, Kim RN, BSN Signed: 06/20/2018 6:18:32 PM By: Carol Ham MD Entered By: Gretta Cool, BSN, RN, CWS, Kim on 06/20/2018 18:02:50

## 2018-06-27 ENCOUNTER — Encounter: Payer: Medicare Other | Admitting: Internal Medicine

## 2018-06-27 DIAGNOSIS — E1122 Type 2 diabetes mellitus with diabetic chronic kidney disease: Secondary | ICD-10-CM | POA: Diagnosis not present

## 2018-06-27 DIAGNOSIS — E11622 Type 2 diabetes mellitus with other skin ulcer: Secondary | ICD-10-CM | POA: Diagnosis not present

## 2018-06-27 DIAGNOSIS — S81802A Unspecified open wound, left lower leg, initial encounter: Secondary | ICD-10-CM | POA: Diagnosis not present

## 2018-06-27 DIAGNOSIS — L97211 Non-pressure chronic ulcer of right calf limited to breakdown of skin: Secondary | ICD-10-CM | POA: Diagnosis not present

## 2018-06-27 DIAGNOSIS — I87312 Chronic venous hypertension (idiopathic) with ulcer of left lower extremity: Secondary | ICD-10-CM | POA: Diagnosis not present

## 2018-06-27 DIAGNOSIS — I890 Lymphedema, not elsewhere classified: Secondary | ICD-10-CM | POA: Diagnosis not present

## 2018-06-27 DIAGNOSIS — I872 Venous insufficiency (chronic) (peripheral): Secondary | ICD-10-CM | POA: Diagnosis not present

## 2018-06-28 NOTE — Progress Notes (Signed)
Steinhaus, Elika L. ([id number]) Visit Report for 06/27/2018 HPI Details Patient Name: Carol Ramsey, Carol Ramsey. Date of Service: 06/27/2018 10:45 AM Medical Record Number: [id number] Patient Account Number: [id number] Date of Birth/Sex: [birth year] (73 y.o. F) Treating RN: Cornell Barman Primary Care Provider: Cranford Mon, Delfino Lovett Other Clinician: Referring Provider: Cranford Mon, RICHARD Treating Provider/Extender: Tito Dine in Treatment: 1 History of Present Illness HPI Description: ADMISSION 06/20/2018 This is a 73 year old woman who has known problems with chronic venous insufficiency and bilateral lower extremity edema. She is also a type II diabetic with recent hemoglobin A1c of 7.2. She tells me in the last 3 to 4 months she is developed a weeping nonhealing area on the left posterior calf. There was no obvious trauma here. She thought she might of had a skin tag on the back of this and was scratching and wonder is if this actually caused the area. She is simply been putting dry gauze on the top of this. She has not been using compression stockings and does not think she would be able to get these on. She does have compression pumps and she says she uses them once a day on most days. She was seen at her primary doctor's office on April 10, 2018 at which time she was felt to have wound cellulitis. She was followed up a week later and given Lasix 20 mg. Culture of the wound grew Pseudomonas and staph and was given a combination of doxycycline transitioning to Cipro. She tells me she follows at a vein clinic in [place]. She had some form of what sounds like sclerotherapy on the medial right upper thigh just above her knee yesterday. Past medical history includes ductal carcinoma of the right breast, type 2 diabetes, venous insufficiency with bilateral lower extremity edema, sciatica, left total knee replacement, hyperthyroidism and hyperlipidemia ABIs on the right were 0.96 and noncompressible  on the left 2/26; patient we admitted to the clinic last week. She has severe bilateral lymphedema. Since she was last here she appears to have had an ablation on the right leg probably the small saphenous vein at a vein clinic in [place]. They are apparently looking to do the same on the left. Her wound areas on the posterior part of the left calf small draining open area with some surrounding skin which is erythematous macerated and looks at some risk. We put her in 3 layer compression last week. She is telling me she uses her pumps twice a day. We have some improvement in the overall edema but not as much as I was hoping Electronic Signature(s) Signed: 06/27/2018 4:42:30 PM By: Linton Ham MD Entered By: Linton Ham on 06/27/2018 12:19:06 Schaff, Travis L. ([id number]) -------------------------------------------------------------------------------- Physical Exam Details Patient Name: Carol Ramsey, Carol Ramsey L. Date of Service: 06/27/2018 10:45 AM Medical Record Number: [id number] Patient Account Number: [id number] Date of Birth/Sex: 1945-12-14 (73 y.o. F) Treating RN: Cornell Barman Primary Care Provider: Cranford Mon, Delfino Lovett Other Clinician: Referring Provider: Cranford Mon, RICHARD Treating Provider/Extender: Tito Dine in Treatment: 1 Constitutional Patient is hypertensive.. Pulse regular and within target range for patient.[name] Respirations regular, non-labored and within target range.. Temperature is normal and within the target range for the patient.[name] appears in no distress. Respiratory Respiratory effort is easy and symmetric bilaterally. Rate is normal at rest and on room air.. Cardiovascular Left foot is warm.. Massive lymphedema in both legs. Integumentary (Hair, Skin) No primary skin lesions are seen. Psychiatric No evidence of depression, anxiety, or agitation. Calm, cooperative, and  communicative. Appropriate interactions and affect.. Notes Wound exam; the patient has a  small superficial area that is open on the left posterior calf. But a many times diameter surrounding wet macerated looking irritated skin around it. There is weeping edema fluid out of all of this. I do not know that this is much different from last week. Electronic Signature(s) Signed: 06/27/2018 4:42:30 PM By: Linton Ham MD Entered By: Linton Ham on 06/27/2018 12:24:17 Carol Ramsey, [name] ([id number]) -------------------------------------------------------------------------------- Physician Orders Details Patient Name: Carol Ramsey, Carol Ramsey. Date of Service: 06/27/2018 10:45 AM Medical Record Number: [id number] Patient Account Number: [id number] Date of Birth/Sex: 05-11-1945 (73 y.o. F) Treating RN: Cornell Barman Primary Care Provider: Cranford Mon, Delfino Lovett Other Clinician: Referring Provider: Cranford Mon, RICHARD Treating Provider/Extender: Tito Dine in Treatment: 1 Verbal / Phone Orders: No Diagnosis Coding Wound Cleansing o May shower with protection. - Cast Protector Skin Barriers/Peri-Wound Care Wound #1 Left,Posterior Lower Leg o Triamcinolone Acetonide Ointment (TCA) - peri-wound only Primary Wound Dressing Wound #1 Left,Posterior Lower Leg o Silver Alginate Secondary Dressing Wound #1 Left,Posterior Lower Leg o ABD pad - Drawtex Dressing Change Frequency Wound #1 Left,Posterior Lower Leg o Change dressing every week Follow-up Appointments Wound #1 Left,Posterior Lower Leg o Return Appointment in 1 week. o Nurse Visit as needed Edema Control Wound #1 Left,Posterior Lower Leg o 4-Layer Compression System - Left Lower Extremity. - If you have any pain, take wrap off immediately. Additional Orders / Instructions Wound #1 Left,Posterior Lower Leg o Other: - Use pumps 2 times per day, every day Electronic Signature(s) Signed: 06/27/2018 4:42:30 PM By: Linton Ham MD Signed: 06/27/2018 5:55:39 PM By: Gretta Cool, BSN, RN, CWS, Kim RN, BSN Entered  By: Gretta Cool, BSN, RN, CWS, Kim on 06/27/2018 11:30:59 Carol Ramsey, [name] ([id number]) -------------------------------------------------------------------------------- Problem List Details Patient Name: Carol Ramsey, Carol Ramsey. Date of Service: 06/27/2018 10:45 AM Medical Record Number: [id number] Patient Account Number: [id number] Date of Birth/Sex: March 18, 1946 (73 y.o. F) Treating RN: Cornell Barman Primary Care Provider: Cranford Mon, Delfino Lovett Other Clinician: Referring Provider: Cranford Mon, RICHARD Treating Provider/Extender: Tito Dine in Treatment: 1 Active Problems ICD-10 Evaluated Encounter Code Description Active Date Today Diagnosis L97.211 Non-pressure chronic ulcer of right calf limited to breakdown 06/20/2018 No Yes of skin I89.0 Lymphedema, not elsewhere classified 06/20/2018 No Yes I87.312 Chronic venous hypertension (idiopathic) with ulcer of left 06/20/2018 No Yes lower extremity Inactive Problems Resolved Problems Electronic Signature(s) Signed: 06/27/2018 4:42:30 PM By: Linton Ham MD Entered By: Linton Ham on 06/27/2018 12:17:21 Carol Ramsey, [name] ([id number]) -------------------------------------------------------------------------------- Progress Note Details Patient Name: Carol Ramsey, Carol Ramsey. Date of Service: 06/27/2018 10:45 AM Medical Record Number: [id number] Patient Account Number: [id number] Date of Birth/Sex: 06/03/1945 (73 y.o. F) Treating RN: Cornell Barman Primary Care Provider: Cranford Mon, Delfino Lovett Other Clinician: Referring Provider: Cranford Mon, RICHARD Treating Provider/Extender: Tito Dine in Treatment: 1 Subjective History of Present Illness (HPI) ADMISSION 06/20/2018 This is a 73 year old woman who has known problems with chronic venous insufficiency and bilateral lower extremity edema. She is also a type II diabetic with recent hemoglobin A1c of 7.2. She tells me in the last 3 to 4 months she is developed a weeping nonhealing area on the left  posterior calf. There was no obvious trauma here. She thought she might of had a skin tag on the back of this and was scratching and wonder is if this actually caused the area. She is simply been putting dry gauze on the top of this. She has not been  using compression stockings and does not think she would be able to get these on. She does have compression pumps and she says she uses them once a day on most days. She was seen at her primary doctor's office on April 10, 2018 at which time she was felt to have wound cellulitis. She was followed up a week later and given Lasix 20 mg. Culture of the wound grew Pseudomonas and staph and was given a combination of doxycycline transitioning to Cipro. She tells me she follows at a vein clinic in [place]. She had some form of what sounds like sclerotherapy on the medial right upper thigh just above her knee yesterday. Past medical history includes ductal carcinoma of the right breast, type 2 diabetes, venous insufficiency with bilateral lower extremity edema, sciatica, left total knee replacement, hyperthyroidism and hyperlipidemia ABIs on the right were 0.96 and noncompressible on the left 2/26; patient we admitted to the clinic last week. She has severe bilateral lymphedema. Since she was last here she appears to have had an ablation on the right leg probably the small saphenous vein at a vein clinic in [place]. They are apparently looking to do the same on the left. Her wound areas on the posterior part of the left calf small draining open area with some surrounding skin which is erythematous macerated and looks at some risk. We put her in 3 layer compression last week. She is telling me she uses her pumps twice a day. We have some improvement in the overall edema but not as much as I was hoping Objective Constitutional Patient is hypertensive.. Pulse regular and within target range for patient.[name] Respirations regular, non-labored and within  target range.. Temperature is normal and within the target range for the patient.[name] appears in no distress. Vitals Time Taken: 10:42 AM, Height: 63 in, Weight: 257 lbs, BMI: 45.5, Temperature: 98.2 F, Pulse: 58 bpm, Respiratory Rate: 18 breaths/min, Blood Pressure: 162/59 mmHg. Respiratory Respiratory effort is easy and symmetric bilaterally. Rate is normal at rest and on room air.Carol Ramsey, [name] ([id number]) Cardiovascular Left foot is warm.. Massive lymphedema in both legs. Psychiatric No evidence of depression, anxiety, or agitation. Calm, cooperative, and communicative. Appropriate interactions and affect.. General Notes: Wound exam; the patient has a small superficial area that is open on the left posterior calf. But a many times diameter surrounding wet macerated looking irritated skin around it. There is weeping edema fluid out of all of this. I do not know that this is much different from last week. Integumentary (Hair, Skin) No primary skin lesions are seen. Wound #1 status is Open. Original cause of wound was Gradually Appeared. The wound is located on the Left,Posterior Lower Leg. The wound measures 3.2cm length x 4.8cm width x 0.1cm depth; 12.064cm^2 area and 1.206cm^3 volume. There is Fat Layer (Subcutaneous Tissue) Exposed exposed. There is no tunneling or undermining noted. There is a medium amount of serous drainage noted. The wound margin is indistinct and nonvisible. There is large (67-100%) pink granulation within the wound bed. There is a small (1-33%) amount of necrotic tissue within the wound bed including Adherent Slough. The periwound skin appearance did not exhibit: Callus, Crepitus, Excoriation, Induration, Rash, Scarring, Dry/Scaly, Maceration, Atrophie Blanche, Cyanosis, Ecchymosis, Hemosiderin Staining, Mottled, Pallor, Rubor, Erythema. Periwound temperature was noted as No Abnormality. The periwound has tenderness on palpation. Assessment Active  Problems ICD-10 Non-pressure chronic ulcer of right calf limited to breakdown of skin Lymphedema, not elsewhere classified Chronic venous hypertension (idiopathic) with  ulcer of left lower extremity Diagnoses ICD-10 L97.211: Non-pressure chronic ulcer of right calf limited to breakdown of skin I89.0: Lymphedema, not elsewhere classified I87.312: Chronic venous hypertension (idiopathic) with ulcer of left lower extremity Plan Wound Cleansing: May shower with protection. - Cast Protector Skin Barriers/Peri-Wound Care: Wound #1 Left,Posterior Lower Leg: Triamcinolone Acetonide Ointment (TCA) - peri-wound only Primary Wound Dressing: Wound #1 Left,Posterior Lower Leg: Silver Alginate Secondary Dressing: Carol Ramsey, [name] ([id number]) Wound #1 Left,Posterior Lower Leg: ABD pad - Drawtex Dressing Change Frequency: Wound #1 Left,Posterior Lower Leg: Change dressing every week Follow-up Appointments: Wound #1 Left,Posterior Lower Leg: Return Appointment in 1 week. Nurse Visit as needed Edema Control: Wound #1 Left,Posterior Lower Leg: 4-Layer Compression System - Left Lower Extremity. - If you have any pain, take wrap off immediately. Additional Orders / Instructions: Wound #1 Left,Posterior Lower Leg: Other: - Use pumps 2 times per day, every day 1. Continue with silver alginate/drawtex/ABDs and I have increased the compression to 4 layer from 3 2. She claims to be using the compression pumps twice a day. 3. The patient is going to need some form of external compression pump garment probably some form of wraparound stocking. We will look towards ordering this in the next week or so Electronic Signature(s) Signed: 06/27/2018 12:25:16 PM By: Linton Ham MD Entered By: Linton Ham on 06/27/2018 12:25:16 Carol Ramsey, [name] ([id number]) -------------------------------------------------------------------------------- SuperBill Details Patient Name: ZYKERIAH, MATHIA. Date of  Service: 06/27/2018 Medical Record Number: [id number] Patient Account Number: [id number] Date of Birth/Sex: 1945-10-24 (73 y.o. F) Treating RN: Cornell Barman Primary Care Provider: Cranford Mon, Delfino Lovett Other Clinician: Referring Provider: Cranford Mon, RICHARD Treating Provider/Extender: Tito Dine in Treatment: 1 Diagnosis Coding ICD-10 Codes Code Description [phone] Non-pressure chronic ulcer of right calf limited to breakdown of skin I89.0 Lymphedema, not elsewhere classified I87.312 Chronic venous hypertension (idiopathic) with ulcer of left lower extremity Facility Procedures CPT4 Code: [id number] Description: (Facility Use Only) [phone] - [place] LWR LT LEG Modifier: Quantity: 1 Physician Procedures CPT4 Code: 7517001 Description: 74944 - WC PHYS LEVEL 3 - EST PT ICD-10 Diagnosis Description L97.211 Non-pressure chronic ulcer of right calf limited to breakdown I89.0 Lymphedema, not elsewhere classified I87.312 Chronic venous hypertension (idiopathic) with ulcer of  left l Modifier: of skin ower extremity Quantity: 1 Electronic Signature(s) Signed: 06/27/2018 4:42:30 PM By: Linton Ham MD Entered By: Linton Ham on 06/27/2018 12:25:39

## 2018-06-29 NOTE — Progress Notes (Signed)
Eide, Amarilis L. ([id number]) Visit Report for 06/27/2018 Arrival Information Details Patient Name: Carol Ramsey, Carol Ramsey. Date of Service: 06/27/2018 10:45 AM Medical Record Number: [id number] Patient Account Number: [id number] Date of Birth/Sex: 12/05/1945 (73 y.o. F) Treating RN: Montey Hora Primary Care Mi Balla: Cranford Mon, Delfino Lovett Other Clinician: Referring Kindred Reidinger: [name] Treating Jacobb Alen/Extender: Tito Dine in Treatment: 1 Visit Information History Since Last Visit Added or deleted any medications: No Patient Arrived: Cane Any new allergies or adverse reactions: No Arrival Time: 10:40 Had a fall or experienced change in No Accompanied By: self activities of daily living that may affect Transfer Assistance: None risk of falls: Patient Identification Verified: Yes Signs or symptoms of abuse/neglect since last visito No Secondary Verification Process Completed: Yes Hospitalized since last visit: No Patient Has Alerts: Yes Implantable device outside of the clinic excluding No Patient Alerts: DMII cellular tissue based products placed in the center since last visit: Has Dressing in Place as Prescribed: Yes Has Compression in Place as Prescribed: Yes Pain Present Now: No Electronic Signature(s) Signed: 06/27/2018 4:40:34 PM By: Montey Hora Entered By: Montey Hora on 06/27/2018 10:42:46 Royle, [name] ([id number]) -------------------------------------------------------------------------------- Encounter Discharge Information Details Patient Name: Carol Ramsey, Carol Ramsey. Date of Service: 06/27/2018 10:45 AM Medical Record Number: [id number] Patient Account Number: [id number] Date of Birth/Sex: 10-13-1945 (73 y.o. F) Treating RN: Harold Barban Primary Care Natausha Jungwirth: Cranford Mon, Delfino Lovett Other Clinician: Referring Megahn Killings: Cranford Mon, RICHARD Treating Christasia Angeletti/Extender: Tito Dine in Treatment: 1 Encounter Discharge Information  Items Discharge Condition: Stable Ambulatory Status: Cane Discharge Destination: Home Transportation: Private Auto Accompanied By: self Schedule Follow-up Appointment: Yes Clinical Summary of Care: Provided Form Type Recipient Paper Patient jn Electronic Signature(s) Signed: 06/27/2018 3:20:57 PM By: Lorine Bears RCP, RRT, CHT Entered By: Lorine Bears on 06/27/2018 11:54:44 Ollis, Consandra L. ([id number]) -------------------------------------------------------------------------------- Lower Extremity Assessment Details Patient Name: Carol Ramsey, Carol Ramsey. Date of Service: 06/27/2018 10:45 AM Medical Record Number: [id number] Patient Account Number: [id number] Date of Birth/Sex: 1946/03/20 (73 y.o. F) Treating RN: Montey Hora Primary Care Schae Cando: Cranford Mon, RICHARD Other Clinician: Referring Servando Kyllonen: Cranford Mon, RICHARD Treating Stetson Pelaez/Extender: Tito Dine in Treatment: 1 Edema Assessment Assessed: [Left: No] [Right: No] Edema: [Left: Ye] [Right: s] Calf Left: Right: Point of Measurement: 34 cm From Medial Instep 55.6 cm cm Ankle Left: Right: Point of Measurement: 10 cm From Medial Instep 27.5 cm cm Vascular Assessment Pulses: Dorsalis Pedis Palpable: [Left:Yes] Posterior Tibial Extremity colors, hair growth, and conditions: Extremity Color: [Left:Normal] Hair Growth on Extremity: [Left:No] Temperature of Extremity: [Left:Warm] Capillary Refill: [Left:< 3 seconds] Toe Nail Assessment Left: Right: Thick: No Discolored: Yes Deformed: No Improper Length and Hygiene: No Electronic Signature(s) Signed: 06/27/2018 4:40:34 PM By: Montey Hora Entered By: Montey Hora on 06/27/2018 10:48:22 Carol Ramsey, [name] ([id number]) -------------------------------------------------------------------------------- Multi Wound Chart Details Patient Name: Carol Ramsey, Carol Ramsey. Date of Service: 06/27/2018 10:45 AM Medical Record Number:  [id number] Patient Account Number: [id number] Date of Birth/Sex: 05/14/1945 (73 y.o. F) Treating RN: Cornell Barman Primary Care Kamerin Axford: Cranford Mon, Delfino Lovett Other Clinician: Referring Jaquelyne Firkus: Cranford Mon, RICHARD Treating Shye Doty/Extender: Tito Dine in Treatment: 1 Vital Signs Height(in): 63 Pulse(bpm): 24 Weight(lbs): 257 Blood Pressure(mmHg): 162/59 Body Mass Index(BMI): 46 Temperature(F): 98.2 Respiratory Rate 18 (breaths/min): Photos: [N/A:N/A] Wound Location: Left Lower Leg - Posterior N/A N/A Wounding Event: Gradually Appeared N/A N/A Primary Etiology: Diabetic Wound/Ulcer of the N/A N/A Lower Extremity Secondary Etiology: Lymphedema N/A N/A Comorbid History: Lymphedema, Hypertension, N/A N/A Peripheral Venous  Disease, Type II Diabetes, End Stage Renal Disease, Osteoarthritis, Neuropathy, Received Radiation Date Acquired: 02/26/2018 N/A N/A Weeks of Treatment: 1 N/A N/A Wound Status: Open N/A N/A Measurements L x W x D 3.2x4.8x0.1 N/A N/A (cm) Area (cm) : 12.064 N/A N/A Volume (cm) : 1.206 N/A N/A % Reduction in Area: 20.20% N/A N/A % Reduction in Volume: 20.20% N/A N/A Classification: Grade 1 N/A N/A Exudate Amount: Medium N/A N/A Exudate Type: Serous N/A N/A Exudate Color: amber N/A N/A Wound Margin: Indistinct, nonvisible N/A N/A Granulation Amount: Large (67-100%) N/A N/A Granulation Quality: Pink N/A N/A Necrotic Amount: Small (1-33%) N/A N/A Exposed Structures: N/A N/A Babiarz, Patric L. ([id number]) Fat Layer (Subcutaneous Tissue) Exposed: Yes Fascia: No Tendon: No Muscle: No Joint: No Bone: No Epithelialization: Small (1-33%) N/A N/A Periwound Skin Texture: Excoriation: No N/A N/A Induration: No Callus: No Crepitus: No Rash: No Scarring: No Periwound Skin Moisture: Maceration: No N/A N/A Dry/Scaly: No Periwound Skin Color: Atrophie Blanche: No N/A N/A Cyanosis: No Ecchymosis: No Erythema: No Hemosiderin Staining:  No Mottled: No Pallor: No Rubor: No Temperature: No Abnormality N/A N/A Tenderness on Palpation: Yes N/A N/A Wound Preparation: Ulcer Cleansing: Other: soap N/A N/A and water Topical Anesthetic Applied: Other: lidocaine 4% Treatment Notes Wound #1 (Left, Posterior Lower Leg) Notes Silver alginate, drawtec, ABD, 4-Layer Left leg Electronic Signature(s) Signed: 06/27/2018 4:42:30 PM By: Linton Ham MD Entered By: Linton Ham on 06/27/2018 12:17:31 Ganus, Taylinn L. ([id number]) -------------------------------------------------------------------------------- [place] Details Patient Name: Carol Ramsey, Carol Ramsey. Date of Service: 06/27/2018 10:45 AM Medical Record Number: [id number] Patient Account Number: [id number] Date of Birth/Sex: 12/07/1945 (73 y.o. F) Treating RN: Cornell Barman Primary Care Lucion Dilger: Cranford Mon, Delfino Lovett Other Clinician: Referring Saige Busby: Cranford Mon, RICHARD Treating Jamyron Redd/Extender: Tito Dine in Treatment: 1 Active Inactive Abuse / Safety / Falls / Self Care Management Nursing Diagnoses: Impaired physical mobility Potential for falls Goals: Patient will remain injury free related to falls Date Initiated: 06/20/2018 Target Resolution Date: 07/19/2018 Goal Status: Active Interventions: Assess fall risk on admission and as needed Notes: Orientation to the Wound Care Program Nursing Diagnoses: Knowledge deficit related to the wound healing center program Goals: Patient/caregiver will verbalize understanding of the [facility] Date Initiated: 06/20/2018 Target Resolution Date: 06/20/2018 Goal Status: Active Interventions: Provide education on orientation to the wound center Notes: Wound/Skin Impairment Nursing Diagnoses: Impaired tissue integrity Goals: Ulcer/skin breakdown will have a volume reduction of 30% by week 4 Date Initiated: 06/20/2018 Target Resolution Date: 07/19/2018 Goal Status:  Active Interventions: Tesoriero, Marlicia L. ([id number]) Assess patient/caregiver ability to obtain necessary supplies Assess patient/caregiver ability to perform ulcer/skin care regimen upon admission and as needed Assess ulceration(s) every visit Provide education on ulcer and skin care Notes: Electronic Signature(s) Signed: 06/27/2018 5:55:39 PM By: Gretta Cool, BSN, RN, CWS, Kim RN, BSN Entered By: Gretta Cool, BSN, RN, CWS, Kim on 06/27/2018 11:26:45 Lohr, Abrish L. ([id number]) -------------------------------------------------------------------------------- Pain Assessment Details Patient Name: Carol Ramsey, Carol Ramsey. Date of Service: 06/27/2018 10:45 AM Medical Record Number: [id number] Patient Account Number: [id number] Date of Birth/Sex: 1946-03-03 (73 y.o. F) Treating RN: Montey Hora Primary Care Laray Corbit: Cranford Mon, Delfino Lovett Other Clinician: Referring Sevastian Witczak: [name] Treating Nazirah Tri/Extender: Tito Dine in Treatment: 1 Active Problems Location of Pain Severity and Description of Pain Patient Has Paino No Site Locations Pain Management and Medication Current Pain Management: Electronic Signature(s) Signed: 06/27/2018 4:40:34 PM By: Montey Hora Entered By: Montey Hora on 06/27/2018 10:42:51 [place]enburg, Liyanna L. ([id number]) -------------------------------------------------------------------------------- Patient/Caregiver  Education Details Patient Name: Carol Ramsey, Carol Ramsey. Date of Service: 06/27/2018 10:45 AM Medical Record Number: [id number] Patient Account Number: [id number] Date of Birth/Gender: [birth year] (73 y.o. F) Treating RN: Cornell Barman Primary Care Physician: Cranford Mon, Delfino Lovett Other Clinician: Referring Physician: [name] Treating Physician/Extender: Tito Dine in Treatment: 1 Education Assessment Education Provided To: Patient Education Topics Provided Venous: Handouts: Controlling Swelling with Multilayered Compression  Wraps Methods: Demonstration, Explain/Verbal Responses: State content correctly Electronic Signature(s) Signed: 06/28/2018 7:54:32 AM By: Harold Barban Entered By: Harold Barban on 06/27/2018 11:49:16 [place], Kemp ([id number]) -------------------------------------------------------------------------------- Wound Assessment Details Patient Name: Carol Ramsey, Carol Ramsey. Date of Service: 06/27/2018 10:45 AM Medical Record Number: [id number] Patient Account Number: [id number] Date of Birth/Sex: Feb 11, 1946 (73 y.o. F) Treating RN: Montey Hora Primary Care Everette Dimauro: Cranford Mon, Delfino Lovett Other Clinician: Referring Kanaya Gunnarson: Cranford Mon, RICHARD Treating Kathren Scearce/Extender: Tito Dine in Treatment: 1 Wound Status Wound Number: 1 Primary Diabetic Wound/Ulcer of the Lower Extremity Etiology: Wound Location: Left Lower Leg - Posterior Secondary Lymphedema Wounding Event: Gradually Appeared Etiology: Date Acquired: 02/26/2018 Wound Open Weeks Of Treatment: 1 Status: Clustered Wound: No Comorbid Lymphedema, Hypertension, Peripheral Venous History: Disease, Type II Diabetes, End Stage Renal Disease, Osteoarthritis, Neuropathy, Received Radiation Photos Photo Uploaded By: Montey Hora on 06/27/2018 11:54:05 Wound Measurements Length: (cm) 3.2 Width: (cm) 4.8 Depth: (cm) 0.1 Area: (cm) 12.064 Volume: (cm) 1.206 % Reduction in Area: 20.2% % Reduction in Volume: 20.2% Epithelialization: Small (1-33%) Tunneling: No Undermining: No Wound Description Classification: Grade 1 Wound Margin: Indistinct, nonvisible Exudate Amount: Medium Exudate Type: Serous Exudate Color: amber Foul Odor After Cleansing: No Slough/Fibrino Yes Wound Bed Granulation Amount: Large (67-100%) Exposed Structure Granulation Quality: Pink Fascia Exposed: No Necrotic Amount: Small (1-33%) Fat Layer (Subcutaneous Tissue) Exposed: Yes Necrotic Quality: Adherent Slough Tendon Exposed:  No Muscle Exposed: No Joint Exposed: No Carol Ramsey, [name] ([id number]) Bone Exposed: No Periwound Skin Texture Texture Color No Abnormalities Noted: No No Abnormalities Noted: No Callus: No Atrophie Blanche: No Crepitus: No Cyanosis: No Excoriation: No Ecchymosis: No Induration: No Erythema: No Rash: No Hemosiderin Staining: No Scarring: No Mottled: No Pallor: No Moisture Rubor: No No Abnormalities Noted: No Dry / Scaly: No Temperature / Pain Maceration: No Temperature: No Abnormality Tenderness on Palpation: Yes Wound Preparation Ulcer Cleansing: Other: soap and water, Topical Anesthetic Applied: Other: lidocaine 4%, Treatment Notes Wound #1 (Left, Posterior Lower Leg) Notes Silver alginate, drawtec, ABD, 4-Layer Left leg Electronic Signature(s) Signed: 06/27/2018 4:40:34 PM By: Montey Hora Entered By: Montey Hora on 06/27/2018 10:51:17 Klingel, [name] ([id number]) -------------------------------------------------------------------------------- Vitals Details Patient Name: Carol Ramsey, Carol Ramsey. Date of Service: 06/27/2018 10:45 AM Medical Record Number: [id number] Patient Account Number: [id number] Date of Birth/Sex: 04/16/1946 (73 y.o. F) Treating RN: Montey Hora Primary Care Laranda Burkemper: Cranford Mon, Delfino Lovett Other Clinician: Referring Lurena Naeve: Cranford Mon, RICHARD Treating Carie Kapuscinski/Extender: Tito Dine in Treatment: 1 Vital Signs Time Taken: 10:42 Temperature (F): 98.2 Height (in): 63 Pulse (bpm): 58 Weight (lbs): 257 Respiratory Rate (breaths/min): 18 Body Mass Index (BMI): 45.5 Blood Pressure (mmHg): 162/59 Reference Range: 80 - 120 mg / dl Electronic Signature(s) Signed: 06/27/2018 4:40:34 PM By: Montey Hora Entered By: Montey Hora on 06/27/2018 10:43:16

## 2018-07-04 ENCOUNTER — Encounter: Payer: Medicare Other | Attending: Internal Medicine | Admitting: Internal Medicine

## 2018-07-04 DIAGNOSIS — L97222 Non-pressure chronic ulcer of left calf with fat layer exposed: Secondary | ICD-10-CM | POA: Diagnosis not present

## 2018-07-04 DIAGNOSIS — Z96652 Presence of left artificial knee joint: Secondary | ICD-10-CM | POA: Diagnosis not present

## 2018-07-04 DIAGNOSIS — I890 Lymphedema, not elsewhere classified: Secondary | ICD-10-CM | POA: Insufficient documentation

## 2018-07-04 DIAGNOSIS — I872 Venous insufficiency (chronic) (peripheral): Secondary | ICD-10-CM | POA: Insufficient documentation

## 2018-07-04 DIAGNOSIS — L97221 Non-pressure chronic ulcer of left calf limited to breakdown of skin: Secondary | ICD-10-CM | POA: Diagnosis not present

## 2018-07-04 DIAGNOSIS — I87312 Chronic venous hypertension (idiopathic) with ulcer of left lower extremity: Secondary | ICD-10-CM | POA: Diagnosis not present

## 2018-07-04 DIAGNOSIS — E11622 Type 2 diabetes mellitus with other skin ulcer: Secondary | ICD-10-CM | POA: Insufficient documentation

## 2018-07-04 DIAGNOSIS — Z853 Personal history of malignant neoplasm of breast: Secondary | ICD-10-CM | POA: Diagnosis not present

## 2018-07-05 DIAGNOSIS — I83893 Varicose veins of bilateral lower extremities with other complications: Secondary | ICD-10-CM | POA: Diagnosis not present

## 2018-07-06 ENCOUNTER — Other Ambulatory Visit: Payer: Self-pay | Admitting: Family Medicine

## 2018-07-06 DIAGNOSIS — I87312 Chronic venous hypertension (idiopathic) with ulcer of left lower extremity: Secondary | ICD-10-CM | POA: Diagnosis not present

## 2018-07-06 DIAGNOSIS — L97221 Non-pressure chronic ulcer of left calf limited to breakdown of skin: Secondary | ICD-10-CM | POA: Diagnosis not present

## 2018-07-06 DIAGNOSIS — Z853 Personal history of malignant neoplasm of breast: Secondary | ICD-10-CM | POA: Diagnosis not present

## 2018-07-06 DIAGNOSIS — I890 Lymphedema, not elsewhere classified: Secondary | ICD-10-CM | POA: Diagnosis not present

## 2018-07-06 DIAGNOSIS — E11622 Type 2 diabetes mellitus with other skin ulcer: Secondary | ICD-10-CM | POA: Diagnosis not present

## 2018-07-06 DIAGNOSIS — I872 Venous insufficiency (chronic) (peripheral): Secondary | ICD-10-CM | POA: Diagnosis not present

## 2018-07-06 NOTE — Progress Notes (Signed)
Ramsey, [name] ([id number]) Visit Report for 07/04/2018 Debridement Details Patient Name: Carol Ramsey, Carol Ramsey. Date of Service: 07/04/2018 9:00 AM Medical Record Number: [id number] Patient Account Number: [id number] Date of Birth/Sex: 12-Jun-1945 (73 y.o. F) Treating RN: Cornell Barman Primary Care Provider: Cranford Mon, Delfino Lovett Other Clinician: Referring Provider: Cranford Mon, RICHARD Treating Provider/Extender: Tito Dine in Treatment: 2 Debridement Performed for Wound #1 Left,Posterior Lower Leg Assessment: Performed By: Physician Ricard Dillon, MD Debridement Type: Chemical/Enzymatic/Mechanical Agent Used: saline and gauze Severity of Tissue Pre Fat layer exposed Debridement: Level of Consciousness (Pre- Awake and Alert procedure): Pre-procedure Verification/Time Yes - 09:00 Out Taken: Start Time: 09:01 Pain Control: Lidocaine Instrument: Other : saline and gauze Bleeding: Minimum Hemostasis Achieved: Pressure End Time: 09:02 Response to Treatment: Procedure was tolerated well Level of Consciousness Awake and Alert (Post-procedure): Post Debridement Measurements of Total Wound Length: (cm) 1.5 Width: (cm) 2 Depth: (cm) 0.1 Volume: (cm) 0.236 Character of Wound/Ulcer Post Debridement: Stable Severity of Tissue Post Debridement: Fat layer exposed Post Procedure Diagnosis Same as Pre-procedure Electronic Signature(s) Signed: 07/04/2018 9:43:33 AM By: Gretta Cool, BSN, RN, CWS, Kim RN, BSN Signed: 07/04/2018 5:05:37 PM By: Linton Ham MD Entered By: Gretta Cool, BSN, RN, CWS, Kim on 07/04/2018 09:43:33 [name], [name] ([id number]) -------------------------------------------------------------------------------- HPI Details Patient Name: Carol Ramsey, Carol Ramsey. Date of Service: 07/04/2018 9:00 AM Medical Record Number: [id number] Patient Account Number: [id number] Date of Birth/Sex: [birth year] (73 y.o. F) Treating RN: Cornell Barman Primary Care Provider: Cranford Mon, Delfino Lovett Other  Clinician: Referring Provider: Cranford Mon, RICHARD Treating Provider/Extender: Tito Dine in Treatment: 2 History of Present Illness HPI Description: ADMISSION 06/20/2018 This is a 73 year old woman who has known problems with chronic venous insufficiency and bilateral lower extremity edema. She is also a type II diabetic with recent hemoglobin A1c of 7.2. She tells me in the last 3 to 4 months she is developed a weeping nonhealing area on the left posterior calf. There was no obvious trauma here. She thought she might of had a skin tag on the back of this and was scratching and wonder is if this actually caused the area. She is simply been putting dry gauze on the top of this. She has not been using compression stockings and does not think she would be able to get these on. She does have compression pumps and she says she uses them once a day on most days. She was seen at her primary doctor's office on April 10, 2018 at which time she was felt to have wound cellulitis. She was followed up a week later and given Lasix 20 mg. Culture of the wound grew Pseudomonas and staph and was given a combination of doxycycline transitioning to Cipro. She tells me she follows at a vein clinic in [place]. She had some form of what sounds like sclerotherapy on the medial right upper thigh just above her knee yesterday. Past medical history includes ductal carcinoma of the right breast, type 2 diabetes, venous insufficiency with bilateral lower extremity edema, sciatica, left total knee replacement, hyperthyroidism and hyperlipidemia ABIs on the right were 0.96 and noncompressible on the left 2/26; patient we admitted to the clinic last week. She has severe bilateral lymphedema. Since she was last here she appears to have had an ablation on the right leg probably the small saphenous vein at a vein clinic in [place]. They are apparently looking to do the same on the left. Her wound areas on the  posterior part of the left calf  small draining open area with some surrounding skin which is erythematous macerated and looks at some risk. We put her in 3 layer compression last week. She is telling me she uses her pumps twice a day. We have some improvement in the overall edema but not as much as I was hoping 3/4; patient we admitted to the clinic 2 weeks ago. She has severe bilateral lymphedema. The wound areas on the left posterior calf. She is also going to a vein clinic in [place] and she tells me that she actually had ablations done on the left leg prior to her coming here. I knew she had the right leg done last week but I was not aware that they had previously worked on the left leg. She tells me she is going for an ultrasound tomorrow. Electronic Signature(s) Signed: 07/04/2018 5:05:37 PM By: Linton Ham MD Entered By: Linton Ham on 07/04/2018 09:19:07 [name], [name] ([id number]) -------------------------------------------------------------------------------- Physical Exam Details Patient Name: Carol Ramsey, Carol Ramsey L. Date of Service: 07/04/2018 9:00 AM Medical Record Number: [id number] Patient Account Number: [id number] Date of Birth/Sex: 1946/02/26 (73 y.o. F) Treating RN: Cornell Barman Primary Care Provider: Cranford Mon, Delfino Lovett Other Clinician: Referring Provider: Cranford Mon, RICHARD Treating Provider/Extender: Tito Dine in Treatment: 2 Constitutional Patient is hypertensive.. Pulse regular and within target range for patient.[name] Respirations regular, non-labored and within target range.. Temperature is normal and within the target range for the patient.[name] appears in no distress. Eyes Conjunctivae clear. No discharge. Respiratory Respiratory effort is easy and symmetric bilaterally. Rate is normal at rest and on room air.. Cardiovascular Pedal pulses palpable and strong bilaterally.. Edema on the left leg is much improved now versus the right and are  4-layer compression. Lymphatic Not palpable in the popliteal area bilaterally. Psychiatric No evidence of depression, anxiety, or agitation. Calm, cooperative, and communicative. Appropriate interactions and affect.. Notes Wound exam; the patient has a much improved area on the left posterior calf. Still 2 small open areas. Some degree of erythema around here but no evidence of cellulitis. Electronic Signature(s) Signed: 07/04/2018 5:05:37 PM By: Linton Ham MD Entered By: Linton Ham on 07/04/2018 09:20:26 Weedon, [name] ([id number]) -------------------------------------------------------------------------------- Physician Orders Details Patient Name: Carol Ramsey, Carol Ramsey. Date of Service: 07/04/2018 9:00 AM Medical Record Number: [id number] Patient Account Number: [id number] Date of Birth/Sex: 1945/07/25 (73 y.o. F) Treating RN: Cornell Barman Primary Care Provider: Cranford Mon, Delfino Lovett Other Clinician: Referring Provider: Cranford Mon, RICHARD Treating Provider/Extender: Tito Dine in Treatment: 2 Verbal / Phone Orders: No Diagnosis Coding Wound Cleansing o May shower with protection. - Cast Protector Skin Barriers/Peri-Wound Care Wound #1 Left,Posterior Lower Leg o Triamcinolone Acetonide Ointment (TCA) - peri-wound only Primary Wound Dressing Wound #1 Left,Posterior Lower Leg o Silver Alginate Secondary Dressing Wound #1 Left,Posterior Lower Leg o ABD pad - Drawtex Dressing Change Frequency Wound #1 Left,Posterior Lower Leg o Change dressing every week Follow-up Appointments Wound #1 Left,Posterior Lower Leg o Return Appointment in 1 week. o Nurse Visit as needed Edema Control Wound #1 Left,Posterior Lower Leg o 4-Layer Compression System - Left Lower Extremity. - If you have any pain, take wrap off immediately. Additional Orders / Instructions Wound #1 Left,Posterior Lower Leg o Other: - Use pumps 2 times per day, every day Electronic  Signature(s) Signed: 07/04/2018 5:05:37 PM By: Linton Ham MD Signed: 07/04/2018 5:29:19 PM By: Gretta Cool, BSN, RN, CWS, Kim RN, BSN Entered By: Gretta Cool, BSN, RN, CWS, Kim on 07/04/2018 09:09:47 Fore, [name] ([id number]) -------------------------------------------------------------------------------- Problem List  Details Patient Name: Carol Ramsey, Carol Ramsey. Date of Service: 07/04/2018 9:00 AM Medical Record Number: [id number] Patient Account Number: [id number] Date of Birth/Sex: 02/05/1946 (73 y.o. 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 ([id number]) -------------------------------------------------------------------------------- Progress Note Details Patient Name: Carol Ramsey, Carol Ramsey. Date of Service: 07/04/2018 9:00 AM Medical Record Number: [id number] Patient Account Number: [id number] Date of Birth/Sex: 05/02/1946 (73 y.o. F) Treating RN: Cornell Barman Primary Care Provider: Cranford Mon, Delfino Lovett Other Clinician: Referring Provider: Cranford Mon, RICHARD Treating Provider/Extender: Tito Dine in Treatment: 2 Subjective History of Present Illness (HPI) ADMISSION 06/20/2018 This is a 73 year old woman who has known problems with chronic venous insufficiency and bilateral lower extremity edema. She is also a type II  diabetic with recent hemoglobin A1c of 7.2. She tells me in the last 3 to 4 months she is developed a weeping nonhealing area on the left posterior calf. There was no obvious trauma here. She thought she might of had a skin tag on the back of this and was scratching and wonder is if this actually caused the area. She is simply been putting dry gauze on the top of this. She has not been using compression stockings and does not think she would be able to get these on. She does have compression pumps and she says she uses them once a day on most days. She was seen at her primary doctor's office on April 10, 2018 at which time she was felt to have wound cellulitis. She was followed up a week later and given Lasix 20 mg. Culture of the wound grew Pseudomonas and staph and was given a combination of doxycycline transitioning to Cipro. She tells me she follows at a vein clinic in [place]. She had some form of what sounds like sclerotherapy on the medial right upper thigh just above her knee yesterday. Past medical history includes ductal carcinoma of the right breast, type 2 diabetes, venous insufficiency with bilateral lower extremity edema, sciatica, left total knee replacement, hyperthyroidism and hyperlipidemia ABIs on the right were 0.96 and noncompressible on the left 2/26; patient we admitted to the clinic last week. She has severe bilateral lymphedema. Since she was last here she appears to have had an ablation on the right leg probably the small saphenous vein at a vein clinic in [place]. They are apparently looking to do the same on the left. Her wound areas on the posterior part of the left calf small draining open area with some surrounding skin which is erythematous macerated and looks at some risk. We put her in 3 layer compression last week. She is telling me she uses her pumps twice a day. We have some improvement in the overall edema but not as much as I was hoping 3/4; patient we  admitted to the clinic 2 weeks ago. She has severe bilateral lymphedema. The wound areas on the left posterior calf. She is also going to a vein clinic in [place] and she tells me  that she actually had ablations done on the left leg prior to her coming here. I knew she had the right leg done last week but I was not aware that they had previously worked on the left leg. She tells me she is going for an ultrasound tomorrow. Objective Constitutional Patient is hypertensive.. Pulse regular and within target range for patient.[name] Respirations regular, non-labored and within target range.. Temperature is normal and within the target range for the patient.[name] appears in no distress. Vitals Time Taken: 8:55 AM, Height: 63 in, Weight: 257 lbs, BMI: 45.5, Temperature: 98.2 F, Pulse: 70 bpm, Respiratory Heikes, [name] ([id number]) Rate: 18 breaths/min, Blood Pressure: 182/52 mmHg. Eyes Conjunctivae clear. No discharge. Respiratory Respiratory effort is easy and symmetric bilaterally. Rate is normal at rest and on room air.. Cardiovascular Pedal pulses palpable and strong bilaterally.. Edema on the left leg is much improved now versus the right and are 4-layer compression. Lymphatic Not palpable in the popliteal area bilaterally. Psychiatric No evidence of depression, anxiety, or agitation. Calm, cooperative, and communicative. Appropriate interactions and affect.. General Notes: Wound exam; the patient has a much improved area on the left posterior calf. Still 2 small open areas. Some degree of erythema around here but no evidence of cellulitis. Integumentary (Hair, Skin) Wound #1 status is Open. Original cause of wound was Gradually Appeared. The wound is located on the Left,Posterior Lower Leg. The wound measures 1.5cm length x 2cm width x 0.1cm depth; 2.356cm^2 area and 0.236cm^3 volume. There is Fat Layer (Subcutaneous Tissue) Exposed exposed. There is no tunneling or undermining noted. There is a  small amount of serous drainage noted. The wound margin is indistinct and nonvisible. There is large (67-100%) pink granulation within the wound bed. There is no necrotic tissue within the wound bed. The periwound skin appearance did not exhibit: Callus, Crepitus, Excoriation, Induration, Rash, Scarring, Dry/Scaly, Maceration, Atrophie Blanche, Cyanosis, Ecchymosis, Hemosiderin Staining, Mottled, Pallor, Rubor, Erythema. Periwound temperature was noted as No Abnormality. The periwound has tenderness on palpation. Assessment Active Problems ICD-10 Non-pressure chronic ulcer of right calf limited to breakdown of skin Lymphedema, not elsewhere classified Chronic venous hypertension (idiopathic) with ulcer of left lower extremity Diagnoses ICD-10 L97.211: Non-pressure chronic ulcer of right calf limited to breakdown of skin I89.0: Lymphedema, not elsewhere classified I87.312: Chronic venous hypertension (idiopathic) with ulcer of left lower extremity Procedures Brummitt, [name] ([id number]) Wound #1 Pre-procedure diagnosis of Wound #1 is a Diabetic Wound/Ulcer of the Lower Extremity located on the Left,Posterior Lower Leg . There was a Four Layer Compression Therapy Procedure with a pre-treatment ABI of 1 by Cornell Barman, RN. Post procedure Diagnosis Wound #1: Same as Pre-Procedure Plan Wound Cleansing: May shower with protection. - Cast Protector Skin Barriers/Peri-Wound Care: Wound #1 Left,Posterior Lower Leg: Triamcinolone Acetonide Ointment (TCA) - peri-wound only Primary Wound Dressing: Wound #1 Left,Posterior Lower Leg: Silver Alginate Secondary Dressing: Wound #1 Left,Posterior Lower Leg: ABD pad - Drawtex Dressing Change Frequency: Wound #1 Left,Posterior Lower Leg: Change dressing every week Follow-up Appointments: Wound #1 Left,Posterior Lower Leg: Return Appointment in 1 week. Nurse Visit as needed Edema Control: Wound #1 Left,Posterior Lower Leg: 4-Layer Compression  System - Left Lower Extremity. - If you have any pain, take wrap off immediately. Additional Orders / Instructions: Wound #1 Left,Posterior Lower Leg: Other: - Use pumps 2 times per day, every day 1. Silver alginate 2. Drawtex/ABDs under 4-layer compression 3. I am not sure about her compliance with external compression pumps 4. She tells me she  cannot get on standard over the toe stockings. We have suggested juxta lite stockings as a reasonable alternative and she seems to agree to this. We will order her one for the left leg and see what her insurance coverage is Electronic Signature(s) Signed: 07/04/2018 5:05:37 PM By: Linton Ham MD Entered By: Linton Ham on 07/04/2018 09:22:12 Mroz, Carol Ramsey ([id number]) -------------------------------------------------------------------------------- SuperBill Details Patient Name: Carol Ramsey, Carol Ramsey. Date of Service: 07/04/2018 Medical Record Number: [id number] Patient Account Number: [id number] Date of Birth/Sex: [birth year] (73 y.o. F) Treating RN: Cornell Barman Primary Care Provider: Cranford Mon, Delfino Lovett Other Clinician: Referring Provider: Cranford Mon, RICHARD Treating Provider/Extender: Tito Dine in Treatment: 2 Diagnosis Coding ICD-10 Codes Code Description [phone] Non-pressure chronic ulcer of right calf limited to breakdown of skin I89.0 Lymphedema, not elsewhere classified I87.312 Chronic venous hypertension (idiopathic) with ulcer of left lower extremity Facility Procedures CPT4 Code Description: [id number] (Facility Use Only) [phone] - APPLY MULTLAY COMPRS LWR RT LEG Modifier: Quantity: 1 Physician Procedures CPT4 Code: 7622633 Description: 35456 - WC PHYS LEVEL 3 - EST PT ICD-10 Diagnosis Description L97.211 Non-pressure chronic ulcer of right calf limited to breakdown I89.0 Lymphedema, not elsewhere classified I87.312 Chronic venous hypertension (idiopathic) with ulcer of  left l Modifier: of skin ower  extremity Quantity: 1 Electronic Signature(s) Signed: 07/04/2018 5:05:37 PM By: Linton Ham MD Entered By: Linton Ham on 07/04/2018 09:22:34

## 2018-07-08 NOTE — Progress Notes (Addendum)
Goodenow, Jolea L. ([id number]) Visit Report for 07/06/2018 Arrival Information Details Patient Name: Carol Ramsey, Carol Ramsey. Date of Service: 07/06/2018 8:30 AM Medical Record Number: [id number] Patient Account Number: [id number] Date of Birth/Sex: 1945-11-19 (73 y.o. F) Treating RN: Harold Barban Primary Care Adreanna Fickel: Cranford Mon, Delfino Lovett Other Clinician: Referring Enrico Eaddy: [name] Treating Agness Sibrian/Extender: Melburn Hake, HOYT Weeks in Treatment: 2 Visit Information History Since Last Visit Added or deleted any medications: No Patient Arrived: Cane Any new allergies or adverse reactions: No Arrival Time: 08:32 Had a fall or experienced change in No Accompanied By: family activities of daily living that may affect Transfer Assistance: None risk of falls: Patient Identification Verified: Yes Signs or symptoms of abuse/neglect since last visito No Secondary Verification Process Completed: Yes Hospitalized since last visit: No Patient Has Alerts: Yes Has Dressing in Place as Prescribed: Yes Patient Alerts: DMII Has Compression in Place as Prescribed: Yes Pain Present Now: No Electronic Signature(s) Signed: 07/06/2018 3:21:09 PM By: Harold Barban Entered By: Harold Barban on 07/06/2018 08:33:47 Bilodeau, Toshie L. ([id number]) -------------------------------------------------------------------------------- Compression Therapy Details Patient Name: Carol Ramsey, Carol Ramsey. Date of Service: 07/06/2018 8:30 AM Medical Record Number: [id number] Patient Account Number: [id number] Date of Birth/Sex: February 25, 1946 (73 y.o. F) Treating RN: Harold Barban Primary Care Shimshon Narula: Cranford Mon, Delfino Lovett Other Clinician: Referring Dima Ferrufino: [name] Treating Corban Kistler/Extender: Melburn Hake, HOYT Weeks in Treatment: 2 Compression Therapy Performed for Wound Assessment: Wound #1 Left,Posterior Lower Leg Performed By: Clinician Harold Barban, RN Compression Type: Four Layer Electronic  Signature(s) Signed: 07/09/2018 8:05:35 AM By: Harold Barban Entered By: Harold Barban on 07/09/2018 08:05:35 Saxer, Collen L. ([id number]) -------------------------------------------------------------------------------- Encounter Discharge Information Details Patient Name: Carol Ramsey, Carol Ramsey. Date of Service: 07/06/2018 8:30 AM Medical Record Number: [id number] Patient Account Number: [id number] Date of Birth/Sex: Jan 13, 1946 (73 y.o. F) Treating RN: Harold Barban Primary Care Derrico Zhong: Cranford Mon, Delfino Lovett Other Clinician: Referring Sena Hoopingarner: [name] Treating Yulisa Chirico/Extender: Sharalyn Ink in Treatment: 2 Encounter Discharge Information Items Discharge Condition: Stable Ambulatory Status: Cane Discharge Destination: Home Transportation: Private Auto Accompanied By: family Schedule Follow-up Appointment: Yes Clinical Summary of Care: Electronic Signature(s) Signed: 07/06/2018 3:21:09 PM By: Harold Barban Entered By: Harold Barban on 07/06/2018 08:51:06 Ditto, Christle L. ([id number]) -------------------------------------------------------------------------------- Patient/Caregiver Education Details Patient Name: Carol Ramsey, Carol Ramsey. Date of Service: 07/06/2018 8:30 AM Medical Record Number: [id number] Patient Account Number: [id number] Date of Birth/Gender: August 25, 1945 (73 y.o. F) Treating RN: Harold Barban Primary Care Physician: Cranford Mon, Delfino Lovett Other Clinician: Referring Physician: [name] Treating Physician/Extender: Sharalyn Ink in Treatment: 2 Education Assessment Education Provided To: Patient Education Topics Provided Wound/Skin Impairment: Handouts: Caring for Your Ulcer Methods: Demonstration, Explain/Verbal Responses: State content correctly Electronic Signature(s) Signed: 07/06/2018 3:21:09 PM By: Harold Barban Entered By: Harold Barban on 07/06/2018 08:50:48 Stucker, Jull L.  ([id number]) -------------------------------------------------------------------------------- Wound Assessment Details Patient Name: Carol Ramsey, Carol Ramsey. Date of Service: 07/06/2018 8:30 AM Medical Record Number: [id number] Patient Account Number: [id number] Date of Birth/Sex: 27-Mar-1946 (73 y.o. F) Treating RN: Harold Barban Primary Care Ashrita Chrismer: Cranford Mon, Delfino Lovett Other Clinician: Referring Yordi Krager: [name] Treating Milaya Hora/Extender: Melburn Hake, HOYT Weeks in Treatment: 2 Wound Status Wound Number: 1 Primary Diabetic Wound/Ulcer of the Lower Extremity Etiology: Wound Location: Left Lower Leg - Posterior Secondary Lymphedema Wounding Event: Gradually Appeared Etiology: Date Acquired: 02/26/2018 Wound Open Weeks Of Treatment: 2 Status: Clustered Wound: No Comorbid Lymphedema, Hypertension, Peripheral History: Venous Disease, Type II Diabetes, End Stage Renal Disease, Osteoarthritis, Neuropathy, Received Radiation Photos Wound Measurements Length: (  cm) 0.8 Width: (cm) 0.5 Depth: (cm) 0.1 Area: (cm) 0.314 Volume: (cm) 0.031 % Reduction in Area: 97.9% % Reduction in Volume: 97.9% Epithelialization: Medium (34-66%) Wound Description Classification: Grade 1 Foul Odor Af Wound Margin: Indistinct, nonvisible Slough/Fibri Exudate Amount: Small Exudate Type: Serous Exudate Color: amber ter Cleansing: No no Yes Wound Bed Granulation Amount: Large (67-100%) Exposed Structure Granulation Quality: Pink Fascia Exposed: No Necrotic Amount: None Present (0%) Fat Layer (Subcutaneous Tissue) Exposed: Yes Tendon Exposed: No Muscle Exposed: No Joint Exposed: No Bone Exposed: No Fallert, [name] ([id number]) Periwound Skin Texture Texture Color No Abnormalities Noted: No No Abnormalities Noted: No Callus: No Atrophie Blanche: No Crepitus: No Cyanosis: No Excoriation: No Ecchymosis: No Induration: No Erythema: No Rash: No Hemosiderin Staining: No Scarring:  No Mottled: No Pallor: No Moisture Rubor: No No Abnormalities Noted: No Dry / Scaly: No Temperature / Pain Maceration: No Temperature: No Abnormality Tenderness on Palpation: Yes Wound Preparation Ulcer Cleansing: Other: soap and water, Treatment Notes Wound #1 (Left, Posterior Lower Leg) Notes Silver alginate, ABD, 4 layer Electronic Signature(s) Signed: 07/06/2018 3:21:09 PM By: Harold Barban Entered By: Harold Barban on 07/06/2018 08:44:44

## 2018-07-08 NOTE — Progress Notes (Signed)
Aicher, Doranne L. ([id number]) Visit Report for 07/04/2018 Arrival Information Details Patient Name: Carol Ramsey, Carol Ramsey. Date of Service: 07/04/2018 9:00 AM Medical Record Number: [id number] Patient Account Number: [id number] Date of Birth/Sex: 08/20/1945 (73 y.o. F) Treating RN: Harold Barban Primary Care Desirey Keahey: Cranford Mon, Delfino Lovett Other Clinician: Referring Jonasia Coiner: Cranford Mon, RICHARD Treating Maurina Fawaz/Extender: Tito Dine in Treatment: 2 Visit Information History Since Last Visit Added or deleted any medications: No Patient Arrived: Cane Any new allergies or adverse reactions: No Arrival Time: 08:52 Had a fall or experienced change in No Accompanied By: self activities of daily living that may affect Transfer Assistance: None risk of falls: Patient Identification Verified: Yes Signs or symptoms of abuse/neglect since last visito No Secondary Verification Process Completed: Yes Hospitalized since last visit: No Patient Has Alerts: Yes Has Dressing in Place as Prescribed: Yes Patient Alerts: DMII Has Compression in Place as Prescribed: Yes Pain Present Now: No Electronic Signature(s) Signed: 07/06/2018 3:21:09 PM By: Harold Barban Entered By: Harold Barban on 07/04/2018 08:55:53 Carol Ramsey, [name] ([id number]) -------------------------------------------------------------------------------- Compression Therapy Details Patient Name: KIAN, GAMARRA. Date of Service: 07/04/2018 9:00 AM Medical Record Number: [id number] Patient Account Number: [id number] Date of Birth/Sex: Dec 28, 1945 (73 y.o. F) Treating RN: Cornell Barman Primary Care Harlie Buening: Cranford Mon, Delfino Lovett Other Clinician: Referring Idus Rathke: Cranford Mon, RICHARD Treating Rhiannon Sassaman/Extender: Tito Dine in Treatment: 2 Compression Therapy Performed for Wound Assessment: Wound #1 Left,Posterior Lower Leg Performed By: Clinician Cornell Barman, RN Compression Type: Four Layer Pre Treatment ABI: 1 Post  Procedure Diagnosis Same as Pre-procedure Electronic Signature(s) Signed: 07/04/2018 5:29:19 PM By: Gretta Cool, BSN, RN, CWS, Kim RN, BSN Entered By: Gretta Cool, BSN, RN, CWS, Kim on 07/04/2018 09:10:50 Wierman, Cason L. ([id number]) -------------------------------------------------------------------------------- Encounter Discharge Information Details Patient Name: Carol Ramsey, Carol Ramsey. Date of Service: 07/04/2018 9:00 AM Medical Record Number: [id number] Patient Account Number: [id number] Date of Birth/Sex: 10-07-1945 (73 y.o. F) Treating RN: Army Melia Primary Care Morgann Woodburn: [name] Other Clinician: Referring Traeson Dusza: Cranford Mon, RICHARD Treating Renton Berkley/Extender: Tito Dine in Treatment: 2 Encounter Discharge Information Items Discharge Condition: Stable Ambulatory Status: Cane Discharge Destination: Home Transportation: Private Auto Accompanied By: self Schedule Follow-up Appointment: Yes Clinical Summary of Care: Electronic Signature(s) Signed: 07/04/2018 4:40:46 PM By: Army Melia Entered By: Army Melia on 07/04/2018 09:29:20 Carol Ramsey, [name] ([id number]) -------------------------------------------------------------------------------- Lower Extremity Assessment Details Patient Name: MELODIE, ASHWORTH. Date of Service: 07/04/2018 9:00 AM Medical Record Number: [id number] Patient Account Number: [id number] Date of Birth/Sex: 21-Apr-1946 (73 y.o. F) Treating RN: Harold Barban Primary Care Fern Asmar: Cranford Mon, Delfino Lovett Other Clinician: Referring Murlene Revell: Cranford Mon, RICHARD Treating Jakeob Tullis/Extender: Tito Dine in Treatment: 2 Edema Assessment Assessed: [Left: No] [Right: No] Edema: [Left: Ye] [Right: s] Calf Left: Right: Point of Measurement: 34 cm From Medial Instep 55 cm cm Ankle Left: Right: Point of Measurement: 10 cm From Medial Instep 26.5 cm cm Vascular Assessment Pulses: Dorsalis Pedis Palpable: [Left:Yes] Posterior Tibial Palpable:  [Left:Yes] Extremity colors, hair growth, and conditions: Extremity Color: [Left:Normal] Hair Growth on Extremity: [Left:No] Temperature of Extremity: [Left:Warm] Capillary Refill: [Left:< 3 seconds] Toe Nail Assessment Left: Right: Thick: No Discolored: Yes Deformed: No Improper Length and Hygiene: No Electronic Signature(s) Signed: 07/06/2018 3:21:09 PM By: Harold Barban Entered By: Harold Barban on 07/04/2018 08:58:26 Carol Ramsey, [name] ([id number]) -------------------------------------------------------------------------------- Multi Wound Chart Details Patient Name: BLAIZE, NIPPER. Date of Service: 07/04/2018 9:00 AM Medical Record Number: [id number] Patient Account Number: [id number] Date of Birth/Sex: 21-Jul-1945 (73 y.o. F) Treating  RN: Cornell Barman Primary Care Cristalle Rohm: Cranford Mon, Delfino Lovett Other Clinician: Referring Sharine Cadle: Cranford Mon, RICHARD Treating Tarina Volk/Extender: Tito Dine in Treatment: 2 Vital Signs Height(in): 63 Pulse(bpm): 70 Weight(lbs): 257 Blood Pressure(mmHg): 182/52 Body Mass Index(BMI): 46 Temperature(F): 98.2 Respiratory Rate 18 (breaths/min): Photos: [N/A:N/A] Wound Location: Left Lower Leg - Posterior N/A N/A Wounding Event: Gradually Appeared N/A N/A Primary Etiology: Diabetic Wound/Ulcer of the N/A N/A Lower Extremity Secondary Etiology: Lymphedema N/A N/A Comorbid History: Lymphedema, Hypertension, N/A N/A Peripheral Venous Disease, Type II Diabetes, End Stage Renal Disease, Osteoarthritis, Neuropathy, Received Radiation Date Acquired: 02/26/2018 N/A N/A Weeks of Treatment: 2 N/A N/A Wound Status: Open N/A N/A Measurements L x W x D 1.5x2x0.1 N/A N/A (cm) Area (cm) : 2.356 N/A N/A Volume (cm) : 0.236 N/A N/A % Reduction in Area: 84.40% N/A N/A % Reduction in Volume: 84.40% N/A N/A Classification: Grade 1 N/A N/A Exudate Amount: Small N/A N/A Exudate Type: Serous N/A N/A Exudate Color: amber N/A N/A Wound  Margin: Indistinct, nonvisible N/A N/A Granulation Amount: Large (67-100%) N/A N/A Granulation Quality: Pink N/A N/A Necrotic Amount: None Present (0%) N/A N/A Exposed Structures: N/A N/A Carol Ramsey, [name] ([id number]) Fat Layer (Subcutaneous Tissue) Exposed: Yes Fascia: No Tendon: No Muscle: No Joint: No Bone: No Epithelialization: Medium (34-66%) N/A N/A Periwound Skin Texture: Excoriation: No N/A N/A Induration: No Callus: No Crepitus: No Rash: No Scarring: No Periwound Skin Moisture: Maceration: No N/A N/A Dry/Scaly: No Periwound Skin Color: Atrophie Blanche: No N/A N/A Cyanosis: No Ecchymosis: No Erythema: No Hemosiderin Staining: No Mottled: No Pallor: No Rubor: No Temperature: No Abnormality N/A N/A Tenderness on Palpation: Yes N/A N/A Wound Preparation: Ulcer Cleansing: Other: soap N/A N/A and water Topical Anesthetic Applied: Other: lidocaine 4% Procedures Performed: Compression Therapy N/A N/A Treatment Notes Electronic Signature(s) Signed: 07/04/2018 5:05:37 PM By: Linton Ham MD Entered By: Linton Ham on 07/04/2018 09:18:07 Carol Ramsey, [name] ([id number]) -------------------------------------------------------------------------------- [place] Details Patient Name: TAKEISHA, CIANCI. Date of Service: 07/04/2018 9:00 AM Medical Record Number: [id number] Patient Account Number: [id number] Date of Birth/Sex: Feb 06, 1946 (73 y.o. F) Treating RN: Cornell Barman Primary Care Aadhav Uhlig: Cranford Mon, Delfino Lovett Other Clinician: Referring Ruari Mudgett: Cranford Mon, RICHARD Treating Ronae Noell/Extender: Tito Dine in Treatment: 2 Active Inactive Abuse / Safety / Falls / Self Care Management Nursing Diagnoses: Impaired physical mobility Potential for falls Goals: Patient will remain injury free related to falls Date Initiated: 06/20/2018 Target Resolution Date: 07/19/2018 Goal Status: Active Interventions: Assess fall risk on admission and  as needed Notes: Orientation to the Wound Care Program Nursing Diagnoses: Knowledge deficit related to the wound healing center program Goals: Patient/caregiver will verbalize understanding of the [facility] Date Initiated: 06/20/2018 Target Resolution Date: 06/20/2018 Goal Status: Active Interventions: Provide education on orientation to the wound center Notes: Wound/Skin Impairment Nursing Diagnoses: Impaired tissue integrity Goals: Ulcer/skin breakdown will have a volume reduction of 30% by week 4 Date Initiated: 06/20/2018 Target Resolution Date: 07/19/2018 Goal Status: Active Interventions: Carol Ramsey, [name] ([id number]) Assess patient/caregiver ability to obtain necessary supplies Assess patient/caregiver ability to perform ulcer/skin care regimen upon admission and as needed Assess ulceration(s) every visit Provide education on ulcer and skin care Notes: Electronic Signature(s) Signed: 07/04/2018 5:29:19 PM By: Gretta Cool, BSN, RN, CWS, Kim RN, BSN Entered By: Gretta Cool, BSN, RN, CWS, Kim on 07/04/2018 09:07:32 Carol Ramsey, [name] ([id number]) -------------------------------------------------------------------------------- Pain Assessment Details Patient Name: JENNETTE, LEASK. Date of Service: 07/04/2018 9:00 AM Medical Record Number: [id number] Patient Account Number:  [id number] Date of Birth/Sex: 07/22/1945 (73 y.o. F) Treating RN: Harold Barban Primary Care Norene Oliveri: Cranford Mon, Delfino Lovett Other Clinician: Referring Nhu Glasby: [name] Treating Donnell Wion/Extender: Tito Dine in Treatment: 2 Active Problems Location of Pain Severity and Description of Pain Patient Has Paino No Site Locations Pain Management and Medication Current Pain Management: Electronic Signature(s) Signed: 07/06/2018 3:21:09 PM By: Harold Barban Entered By: Harold Barban on 07/04/2018 08:56:00 Carol Ramsey, [name]  ([id number]) -------------------------------------------------------------------------------- Patient/Caregiver Education Details Patient Name: Carol Ramsey, Carol Ramsey. Date of Service: 07/04/2018 9:00 AM Medical Record Number: [id number] Patient Account Number: [id number] Date of Birth/Gender: [birth year] (73 y.o. F) Treating RN: Cornell Barman Primary Care Physician: Cranford Mon, Delfino Lovett Other Clinician: Referring Physician: [name] Treating Physician/Extender: Tito Dine in Treatment: 2 Education Assessment Education Provided To: Patient Education Topics Provided Wound/Skin Impairment: Handouts: Caring for Your Ulcer Methods: Demonstration, Explain/Verbal Responses: State content correctly Electronic Signature(s) Signed: 07/04/2018 5:29:19 PM By: Gretta Cool, BSN, RN, CWS, Kim RN, BSN Entered By: Gretta Cool, BSN, RN, CWS, Kim on 07/04/2018 09:11:29 Carol Ramsey, Carol Ramsey ([id number]) -------------------------------------------------------------------------------- Wound Assessment Details Patient Name: Carol Ramsey, Carol Ramsey. Date of Service: 07/04/2018 9:00 AM Medical Record Number: [id number] Patient Account Number: [id number] Date of Birth/Sex: [birth year] (73 y.o. F) Treating RN: Harold Barban Primary Care Cloyd Ragas: Cranford Mon, Delfino Lovett Other Clinician: Referring Audris Speaker: Cranford Mon, RICHARD Treating Montie Gelardi/Extender: Tito Dine in Treatment: 2 Wound Status Wound Number: 1 Primary Diabetic Wound/Ulcer of the Lower Extremity Etiology: Wound Location: Left Lower Leg - Posterior Secondary Lymphedema Wounding Event: Gradually Appeared Etiology: Date Acquired: 02/26/2018 Wound Open Weeks Of Treatment: 2 Status: Clustered Wound: No Comorbid Lymphedema, Hypertension, Peripheral Venous History: Disease, Type II Diabetes, End Stage Renal Disease, Osteoarthritis, Neuropathy, Received Radiation Photos Wound Measurements Length: (cm) 1.5 % Reduction Width: (cm) 2 %  Reduction Depth: (cm) 0.1 Epitheliali Area: (cm) 2.356 Tunneling: Volume: (cm) 0.236 Underminin in Area: 84.4% in Volume: 84.4% zation: Medium (34-66%) No g: No Wound Description Classification: Grade 1 Foul Odor A Wound Margin: Indistinct, nonvisible Slough/Fibr Exudate Amount: Small Exudate Type: Serous Exudate Color: amber fter Cleansing: No ino Yes Wound Bed Granulation Amount: Large (67-100%) Exposed Structure Granulation Quality: Pink Fascia Exposed: No Necrotic Amount: None Present (0%) Fat Layer (Subcutaneous Tissue) Exposed: Yes Tendon Exposed: No Muscle Exposed: No Joint Exposed: No Bone Exposed: No Schnelle, Aishi L. ([id number]) Periwound Skin Texture Texture Color No Abnormalities Noted: No No Abnormalities Noted: No Callus: No Atrophie Blanche: No Crepitus: No Cyanosis: No Excoriation: No Ecchymosis: No Induration: No Erythema: No Rash: No Hemosiderin Staining: No Scarring: No Mottled: No Pallor: No Moisture Rubor: No No Abnormalities Noted: No Dry / Scaly: No Temperature / Pain Maceration: No Temperature: No Abnormality Tenderness on Palpation: Yes Wound Preparation Ulcer Cleansing: Other: soap and water, Topical Anesthetic Applied: Other: lidocaine 4%, Electronic Signature(s) Signed: 07/06/2018 3:21:09 PM By: Harold Barban Entered By: Harold Barban on 07/04/2018 09:02:37 Burnside, Shresta L. ([id number]) -------------------------------------------------------------------------------- Vitals Details Patient Name: TYJAE, SHVARTSMAN. Date of Service: 07/04/2018 9:00 AM Medical Record Number: [id number] Patient Account Number: [id number] Date of Birth/Sex: 07/16/1945 (73 y.o. F) Treating RN: Harold Barban Primary Care Mahreen Schewe: Cranford Mon, Delfino Lovett Other Clinician: Referring Shawonda Kerce: Cranford Mon, RICHARD Treating Aaden Buckman/Extender: Tito Dine in Treatment: 2 Vital Signs Time Taken: 08:55 Temperature (F): 98.2 Height (in):  63 Pulse (bpm): 70 Weight (lbs): 257 Respiratory Rate (breaths/min): 18 Body Mass Index (BMI): 45.5 Blood Pressure (mmHg): 182/52 Reference Range: 80 - 120 mg / dl Electronic Signature(s) Signed:  07/06/2018 3:21:09 PM By: Harold Barban Entered By: Harold Barban on 07/04/2018 08:57:28

## 2018-07-11 ENCOUNTER — Encounter: Payer: Medicare Other | Admitting: Internal Medicine

## 2018-07-11 ENCOUNTER — Other Ambulatory Visit: Payer: Self-pay

## 2018-07-11 DIAGNOSIS — S81802A Unspecified open wound, left lower leg, initial encounter: Secondary | ICD-10-CM | POA: Diagnosis not present

## 2018-07-11 DIAGNOSIS — E11622 Type 2 diabetes mellitus with other skin ulcer: Secondary | ICD-10-CM | POA: Diagnosis not present

## 2018-07-11 DIAGNOSIS — L97221 Non-pressure chronic ulcer of left calf limited to breakdown of skin: Secondary | ICD-10-CM | POA: Diagnosis not present

## 2018-07-11 DIAGNOSIS — I890 Lymphedema, not elsewhere classified: Secondary | ICD-10-CM | POA: Diagnosis not present

## 2018-07-11 DIAGNOSIS — Z853 Personal history of malignant neoplasm of breast: Secondary | ICD-10-CM | POA: Diagnosis not present

## 2018-07-11 DIAGNOSIS — I87312 Chronic venous hypertension (idiopathic) with ulcer of left lower extremity: Secondary | ICD-10-CM | POA: Diagnosis not present

## 2018-07-11 DIAGNOSIS — I872 Venous insufficiency (chronic) (peripheral): Secondary | ICD-10-CM | POA: Diagnosis not present

## 2018-07-12 NOTE — Progress Notes (Signed)
Ramsey, [name] ([id number]) Visit Report for 07/11/2018 HPI Details Patient Name: Carol Ramsey, Carol Ramsey. Date of Service: 07/11/2018 9:15 AM Medical Record Number: [id number] Patient Account Number: [id number] Date of Birth/Sex: [birth year] (73 y.o. F) Treating RN: Carol Ramsey Primary Care Provider: Cranford Ramsey, Carol Ramsey Other Clinician: Referring Provider: Cranford Ramsey, Carol Treating Provider/Extender: Carol Ramsey in Treatment: 3 History of Present Illness HPI Description: ADMISSION 06/20/2018 This is a 73 year old woman who has known problems with chronic venous insufficiency and bilateral lower extremity edema. She is also a type II diabetic with recent hemoglobin A1c of 7.2. She tells me in the last 3 to 4 months she is developed a weeping nonhealing area on the left posterior calf. There was no obvious trauma here. She thought she might of had a skin tag on the back of this and was scratching and wonder is if this actually caused the area. She is simply been putting dry gauze on the top of this. She has not been using compression stockings and does not think she would be able to get these on. She does have compression pumps and she says she uses them once a day on most days. She was seen at her primary doctor's office on April 10, 2018 at which time she was felt to have wound cellulitis. She was followed up a week later and given Lasix 20 mg. Culture of the wound grew Pseudomonas and staph and was given a combination of doxycycline transitioning to Cipro. She tells me she follows at a vein clinic in [place]. She had some form of what sounds like sclerotherapy on the medial right upper thigh just above her knee yesterday. Past medical history includes ductal carcinoma of the right breast, type 2 diabetes, venous insufficiency with bilateral lower extremity edema, sciatica, left total knee replacement, hyperthyroidism and hyperlipidemia ABIs on the right were 0.96 and noncompressible  on the left 2/26; patient we admitted to the clinic last week. She has severe bilateral lymphedema. Since she was last here she appears to have had an ablation on the right leg probably the small saphenous vein at a vein clinic in [place]. They are apparently looking to do the same on the left. Her wound areas on the posterior part of the left calf small draining open area with some surrounding skin which is erythematous macerated and looks at some risk. We put her in 3 layer compression last week. She is telling me she uses her pumps twice a day. We have some improvement in the overall edema but not as much as I was hoping 3/4; patient we admitted to the clinic 2 weeks ago. She has severe bilateral lymphedema. The wound areas on the left posterior calf. She is also going to a vein clinic in [place] and she tells me that she actually had ablations done on the left leg prior to her coming here. I knew she had the right leg done last week but I was not aware that they had previously worked on the left leg. She tells me she is going for an ultrasound tomorrow. 3/11; we have been putting 4-layer compression on the left leg. Small open wound on the left posterior calf is better this week with improved dimensions. She has had venous ablations and tells me she is going to start using her compression pumps today. She has a single external compression garment stocking that we will put on the right leg today and make sure that she can actually do this herself. If  she can then we will order one for the left leg. She was cautioned that she can use her compression pumps either over our wraps or compression stockings of any form Electronic Signature(s) Signed: 07/11/2018 5:42:07 PM By: Carol Ham MD Entered By: Carol Ramsey on 07/11/2018 09:59:44 Ramsey, [name] ([id number]) -------------------------------------------------------------------------------- Physical Exam Details Patient Name: Carol Ramsey, Carol Ramsey  L. Date of Service: 07/11/2018 9:15 AM Medical Record Number: [id number] Patient Account Number: [id number] Date of Birth/Sex: 18-Mar-1946 (73 y.o. F) Treating RN: Carol Ramsey Primary Care Provider: Cranford Ramsey, Carol Ramsey Other Clinician: Referring Provider: Cranford Ramsey, Carol Treating Provider/Extender: Carol Ramsey in Treatment: 3 Constitutional Patient is hypertensive.. Pulse regular and within target range for patient.[name] Respirations regular, non-labored and within target range.. Temperature is normal and within the target range for the patient.[name] appears in no distress. Eyes Conjunctivae clear. No discharge. Respiratory Respiratory effort is easy and symmetric bilaterally. Rate is normal at rest and on room air.. Cardiovascular Pedal pulses palpable on the left. Lymphatic None palpable in the popliteal area bilaterally. Integumentary (Hair, Skin) Other than bilateral severe lymphedema no other cutaneous issues are seen. Psychiatric No evidence of depression, anxiety, or agitation. Calm, cooperative, and communicative. Appropriate interactions and affect.. Notes Wound exam; left posterior calf. One small open area remains. This looks like it is epithelializing. Base of the wound looks healthy. No debridement is required. This should be closed by next week Electronic Signature(s) Signed: 07/11/2018 5:42:07 PM By: Carol Ham MD Entered By: Carol Ramsey on 07/11/2018 10:01:54 Ramsey, [place] ([id number]) -------------------------------------------------------------------------------- Physician Orders Details Patient Name: Carol Ramsey. Date of Service: 07/11/2018 9:15 AM Medical Record Number: [id number] Patient Account Number: [id number] Date of Birth/Sex: [birth year] (73 y.o. F) Treating RN: Carol Ramsey Primary Care Provider: Cranford Ramsey, Carol Ramsey Other Clinician: Referring Provider: Cranford Ramsey, Carol Treating Provider/Extender: Carol Ramsey in Treatment:  3 Verbal / Phone Orders: No Diagnosis Coding Wound Cleansing o May shower with protection. - Cast Protector Skin Barriers/Peri-Wound Care Wound #1 Left,Posterior Lower Leg o Triamcinolone Acetonide Ointment (TCA) - peri-wound only Primary Wound Dressing Wound #1 Left,Posterior Lower Leg o Silver Alginate Secondary Dressing Wound #1 Left,Posterior Lower Leg o ABD pad - Drawtex Dressing Change Frequency Wound #1 Left,Posterior Lower Leg o Change dressing every week Follow-up Appointments Wound #1 Left,Posterior Lower Leg o Return Appointment in 1 week. o Nurse Visit as needed Edema Control Wound #1 Left,Posterior Lower Leg o 4-Layer Compression System - Left Lower Extremity. - If you have any pain, take wrap off immediately. o Patient to wear own compression stockings - Farrow on right leg Additional Orders / Instructions Wound #1 Left,Posterior Lower Leg o Other: - Use pumps 2 times per day, every day Electronic Signature(s) Signed: 07/11/2018 5:23:21 PM By: Gretta Cool, BSN, RN, CWS, Kim RN, BSN Signed: 07/11/2018 5:42:07 PM By: Carol Ham MD Entered By: Gretta Cool, BSN, RN, CWS, Kim on 07/11/2018 09:40:51 [place], Carol Ramsey ([id number]) -------------------------------------------------------------------------------- Problem List Details Patient Name: Carol Ramsey, Carol Ramsey. Date of Service: 07/11/2018 9:15 AM Medical Record Number: [id number] Patient Account Number: [id number] Date of Birth/Sex: 1945/07/21 (73 y.o. F) Treating RN: Carol Ramsey Primary Care Provider: Cranford Ramsey, Carol Ramsey Other Clinician: Referring Provider: Cranford Ramsey, Carol Treating Provider/Extender: Carol Ramsey in Treatment: 3 Active Problems ICD-10 Evaluated Encounter Code Description Active Date Today Diagnosis L97.221 Non-pressure chronic ulcer of left calf limited to breakdown of 07/11/2018 No Yes skin I89.0 Lymphedema, not elsewhere classified 06/20/2018 No Yes I87.312 Chronic venous  hypertension (idiopathic) with ulcer  of left 06/20/2018 No Yes lower extremity Inactive Problems Resolved Problems Electronic Signature(s) Signed: 07/11/2018 5:42:07 PM By: Carol Ham MD Entered By: Carol Ramsey on 07/11/2018 10:04:01 Carol Ramsey, Carol Ramsey ([id number]) -------------------------------------------------------------------------------- Progress Note Details Patient Name: Carol Ramsey, Carol Ramsey. Date of Service: 07/11/2018 9:15 AM Medical Record Number: [id number] Patient Account Number: [id number] Date of Birth/Sex: 03-20-1946 (73 y.o. F) Treating RN: Carol Ramsey Primary Care Provider: Cranford Ramsey, Carol Ramsey Other Clinician: Referring Provider: Cranford Ramsey, Carol Treating Provider/Extender: Carol Ramsey in Treatment: 3 Subjective History of Present Illness (HPI) ADMISSION 06/20/2018 This is a 73 year old woman who has known problems with chronic venous insufficiency and bilateral lower extremity edema. She is also a type II diabetic with recent hemoglobin A1c of 7.2. She tells me in the last 3 to 4 months she is developed a weeping nonhealing area on the left posterior calf. There was no obvious trauma here. She thought she might of had a skin tag on the back of this and was scratching and wonder is if this actually caused the area. She is simply been putting dry gauze on the top of this. She has not been using compression stockings and does not think she would be able to get these on. She does have compression pumps and she says she uses them once a day on most days. She was seen at her primary doctor's office on April 10, 2018 at which time she was felt to have wound cellulitis. She was followed up a week later and given Lasix 20 mg. Culture of the wound grew Pseudomonas and staph and was given a combination of doxycycline transitioning to Cipro. She tells me she follows at a vein clinic in [place]. She had some form of what sounds like sclerotherapy on the medial  right upper thigh just above her knee yesterday. Past medical history includes ductal carcinoma of the right breast, type 2 diabetes, venous insufficiency with bilateral lower extremity edema, sciatica, left total knee replacement, hyperthyroidism and hyperlipidemia ABIs on the right were 0.96 and noncompressible on the left 2/26; patient we admitted to the clinic last week. She has severe bilateral lymphedema. Since she was last here she appears to have had an ablation on the right leg probably the small saphenous vein at a vein clinic in [place]. They are apparently looking to do the same on the left. Her wound areas on the posterior part of the left calf small draining open area with some surrounding skin which is erythematous macerated and looks at some risk. We put her in 3 layer compression last week. She is telling me she uses her pumps twice a day. We have some improvement in the overall edema but not as much as I was hoping 3/4; patient we admitted to the clinic 2 weeks ago. She has severe bilateral lymphedema. The wound areas on the left posterior calf. She is also going to a vein clinic in [place] and she tells me that she actually had ablations done on the left leg prior to her coming here. I knew she had the right leg done last week but I was not aware that they had previously worked on the left leg. She tells me she is going for an ultrasound tomorrow. 3/11; we have been putting 4-layer compression on the left leg. Small open wound on the left posterior calf is better this week with improved dimensions. She has had venous ablations and tells me she is going to start using her compression pumps today. She has  a single external compression garment stocking that we will put on the right leg today and make sure that she can actually do this herself. If she can then we will order one for the left leg. She was cautioned that she can use her compression pumps either over our wraps or  compression stockings of any form Objective Constitutional Ramsey, [name] ([id number]) Patient is hypertensive.. Pulse regular and within target range for patient.[name] Respirations regular, non-labored and within target range.. Temperature is normal and within the target range for the patient.[name] appears in no distress. Vitals Time Taken: 9:19 AM, Height: 63 in, Weight: 257 lbs, BMI: 45.5, Temperature: 98.5 F, Pulse: 60 bpm, Respiratory Rate: 16 breaths/min, Blood Pressure: 188/47 mmHg. Eyes Conjunctivae clear. No discharge. Respiratory Respiratory effort is easy and symmetric bilaterally. Rate is normal at rest and on room air.. Cardiovascular Pedal pulses palpable on the left. Lymphatic None palpable in the popliteal area bilaterally. Psychiatric No evidence of depression, anxiety, or agitation. Calm, cooperative, and communicative. Appropriate interactions and affect.. General Notes: Wound exam; left posterior calf. One small open area remains. This looks like it is epithelializing. Base of the wound looks healthy. No debridement is required. This should be closed by next week Integumentary (Hair, Skin) Other than bilateral severe lymphedema no other cutaneous issues are seen. Wound #1 status is Open. Original cause of wound was Gradually Appeared. The wound is located on the Left,Posterior Lower Leg. The wound measures 0.5cm length x 0.3cm width x 0.1cm depth; 0.118cm^2 area and 0.012cm^3 volume. There is Fat Layer (Subcutaneous Tissue) Exposed exposed. There is no tunneling or undermining noted. There is a small amount of serous drainage noted. The wound margin is indistinct and nonvisible. There is medium (34-66%) pink granulation within the wound bed. There is a medium (34-66%) amount of necrotic tissue within the wound bed including Eschar and Adherent Slough. The periwound skin appearance exhibited: Dry/Scaly. The periwound skin appearance did not exhibit: Callus, Crepitus,  Excoriation, Induration, Rash, Scarring, Maceration, Atrophie Blanche, Cyanosis, Ecchymosis, Hemosiderin Staining, Mottled, Pallor, Rubor, Erythema. Periwound temperature was noted as No Abnormality. The periwound has tenderness on palpation. Assessment Active Problems ICD-10 Non-pressure chronic ulcer of left calf limited to breakdown of skin Lymphedema, not elsewhere classified Chronic venous hypertension (idiopathic) with ulcer of left lower extremity Diagnoses ICD-10 L97.211: Non-pressure chronic ulcer of right calf limited to breakdown of skin I89.0: Lymphedema, not elsewhere classified I87.312: Chronic venous hypertension (idiopathic) with ulcer of left lower extremity Carol Ramsey, [name] ([id number]) Plan Wound Cleansing: May shower with protection. - Cast Protector Skin Barriers/Peri-Wound Care: Wound #1 Left,Posterior Lower Leg: Triamcinolone Acetonide Ointment (TCA) - peri-wound only Primary Wound Dressing: Wound #1 Left,Posterior Lower Leg: Silver Alginate Secondary Dressing: Wound #1 Left,Posterior Lower Leg: ABD pad - Drawtex Dressing Change Frequency: Wound #1 Left,Posterior Lower Leg: Change dressing every week Follow-up Appointments: Wound #1 Left,Posterior Lower Leg: Return Appointment in 1 week. Nurse Visit as needed Edema Control: Wound #1 Left,Posterior Lower Leg: 4-Layer Compression System - Left Lower Extremity. - If you have any pain, take wrap off immediately. Patient to wear own compression stockings - Farrow on right leg Additional Orders / Instructions: Wound #1 Left,Posterior Lower Leg: Other: - Use pumps 2 times per day, every day 1. Continue with silver alginate under 4-layer compression 2. Order an additional Farrow stocking for the left leg. Hopefully she can properly apply these herself. 3. Likely to be healed next week Electronic Signature(s) Signed: 07/11/2018 10:04:52 AM By: Carol Ham MD Entered  By: Carol Ramsey on 07/11/2018  10:04:51 [place], [place] ([id number]) -------------------------------------------------------------------------------- SuperBill Details Patient Name: Carol Ramsey, Carol Ramsey. Date of Service: 07/11/2018 Medical Record Number: [id number] Patient Account Number: [id number] Date of Birth/Sex: 16-Mar-1946 (73 y.o. F) Treating RN: Carol Ramsey Primary Care Provider: Cranford Ramsey, Carol Ramsey Other Clinician: Referring Provider: Cranford Ramsey, Carol Treating Provider/Extender: Carol Ramsey in Treatment: 3 Diagnosis Coding ICD-10 Codes Code Description [phone] Non-pressure chronic ulcer of left calf limited to breakdown of skin I89.0 Lymphedema, not elsewhere classified I87.312 Chronic venous hypertension (idiopathic) with ulcer of left lower extremity Facility Procedures CPT4 Code: [id number] Description: (Facility Use Only) [phone] - [facility] LT LEG Modifier: Quantity: 1 Physician Procedures CPT4 Code: [id number] Description: [id number] - WC PHYS LEVEL 3 - EST PT ICD-10 Diagnosis Description L97.221 Non-pressure chronic ulcer of left calf limited to breakdown I87.312 Chronic venous hypertension (idiopathic) with ulcer of left l Modifier: of skin ower extremity Quantity: 1 Electronic Signature(s) Signed: 07/11/2018 5:42:07 PM By: Carol Ham MD Entered By: Carol Ramsey on 07/11/2018 10:05:23

## 2018-07-12 NOTE — Progress Notes (Signed)
Ramsey, [name] ([id number]) Visit Report for 07/11/2018 Arrival Information Details Patient Name: Carol Ramsey, Carol Ramsey. Date of Service: 07/11/2018 9:15 AM Medical Record Number: [id number] Patient Account Number: [id number] Date of Birth/Sex: 19-Nov-1945 (73 y.o. F) Treating RN: Army Melia Primary Care Mikenna Bunkley: Cranford Mon, Delfino Lovett Other Clinician: Referring Markese Bloxham: Cranford Mon, RICHARD Treating Braylie Badami/Extender: Tito Dine in Treatment: 3 Visit Information History Since Last Visit Added or deleted any medications: No Patient Arrived: Ambulatory Any new allergies or adverse reactions: No Arrival Time: 09:19 Had a fall or experienced change in No Accompanied By: self activities of daily living that may affect Transfer Assistance: None risk of falls: Patient Has Alerts: Yes Signs or symptoms of abuse/neglect since last visito No Patient Alerts: DMII Hospitalized since last visit: No Implantable device outside of the clinic excluding No cellular tissue based products placed in the center since last visit: Has Dressing in Place as Prescribed: Yes Pain Present Now: No Electronic Signature(s) Signed: 07/11/2018 11:45:59 AM By: Army Melia Entered By: Army Melia on 07/11/2018 09:19:31 [place], [place] ([id number]) -------------------------------------------------------------------------------- Encounter Discharge Information Details Patient Name: Carol Ramsey. Date of Service: 07/11/2018 9:15 AM Medical Record Number: [id number] Patient Account Number: [id number] Date of Birth/Sex: May 05, 1945 (73 y.o. F) Treating RN: Army Melia Primary Care Haasini Patnaude: [name] Other Clinician: Referring Caralynn Gelber: Cranford Mon, RICHARD Treating Jacquelyn Shadrick/Extender: Tito Dine in Treatment: 3 Encounter Discharge Information Items Discharge Condition: Stable Ambulatory Status: Cane Discharge Destination: Home Transportation: Private Auto Accompanied By:  self Schedule Follow-up Appointment: Yes Clinical Summary of Care: Electronic Signature(s) Signed: 07/11/2018 11:45:59 AM By: Army Melia Entered By: Army Melia on 07/11/2018 10:04:33 Cellucci, Raylei L. ([id number]) -------------------------------------------------------------------------------- Lower Extremity Assessment Details Patient Name: Ramsey, [name]. Date of Service: 07/11/2018 9:15 AM Medical Record Number: [id number] Patient Account Number: [id number] Date of Birth/Sex: [birth year] (73 y.o. F) Treating RN: Army Melia Primary Care Alee Gressman: Cranford Mon, Delfino Lovett Other Clinician: Referring Lillyian Heidt: Cranford Mon, RICHARD Treating Dorothye Berni/Extender: Tito Dine in Treatment: 3 Edema Assessment Assessed: [Left: No] [Right: No] Edema: [Left: N] [Right: o] Calf Left: Right: Point of Measurement: 34 cm From Medial Instep 56 cm cm Ankle Left: Right: Point of Measurement: 10 cm From Medial Instep 25 cm cm Vascular Assessment Pulses: Dorsalis Pedis Palpable: [Left:Yes] Posterior Tibial Extremity colors, hair growth, and conditions: Extremity Color: [Left:Normal] Hair Growth on Extremity: [Left:No] Temperature of Extremity: [Left:Warm] Capillary Refill: [Left:< 3 seconds] Toe Nail Assessment Left: Right: Thick: Yes Discolored: No Deformed: No Improper Length and Hygiene: No Electronic Signature(s) Signed: 07/11/2018 11:45:59 AM By: Army Melia Entered By: Army Melia on 07/11/2018 09:29:15 Wiechman, Demarie L. ([id number]) -------------------------------------------------------------------------------- Multi Wound Chart Details Patient Name: Ramsey, [name]. Date of Service: 07/11/2018 9:15 AM Medical Record Number: [id number] Patient Account Number: [id number] Date of Birth/Sex: [birth year] (73 y.o. F) Treating RN: Cornell Barman Primary Care Bryant Lipps: Cranford Mon, Delfino Lovett Other Clinician: Referring Sandor Arboleda: Cranford Mon, RICHARD Treating Debbora Ang/Extender: Tito Dine in Treatment: 3 Vital Signs Height(in): 63 Pulse(bpm): 60 Weight(lbs): 257 Blood Pressure(mmHg): 188/47 Body Mass Index(BMI): 46 Temperature(F): 98.5 Respiratory Rate 16 (breaths/min): Photos: [N/A:N/A] Wound Location: Left Lower Leg - Posterior N/A N/A Wounding Event: Gradually Appeared N/A N/A Primary Etiology: Diabetic Wound/Ulcer of the N/A N/A Lower Extremity Secondary Etiology: Lymphedema N/A N/A Comorbid History: Lymphedema, Hypertension, N/A N/A Peripheral Venous Disease, Type II Diabetes, End Stage Renal Disease, Osteoarthritis, Neuropathy, Received Radiation Date Acquired: 02/26/2018 N/A N/A Weeks of Treatment: 3 N/A N/A Wound Status: Open N/A N/A  Measurements L x W x D 0.5x0.3x0.1 N/A N/A (cm) Area (cm) : 0.118 N/A N/A Volume (cm) : 0.012 N/A N/A % Reduction in Area: 99.20% N/A N/A % Reduction in Volume: 99.20% N/A N/A Classification: Grade 1 N/A N/A Exudate Amount: Small N/A N/A Exudate Type: Serous N/A N/A Exudate Color: amber N/A N/A Wound Margin: Indistinct, nonvisible N/A N/A Granulation Amount: Medium (34-66%) N/A N/A Granulation Quality: Pink N/A N/A Necrotic Amount: Medium (34-66%) N/A N/A Necrotic Tissue: Eschar, Adherent Slough N/A N/A Kliewer, Bryanda L. ([id number]) Exposed Structures: Fat Layer (Subcutaneous N/A N/A Tissue) Exposed: Yes Fascia: No Tendon: No Muscle: No Joint: No Bone: No Epithelialization: Medium (34-66%) N/A N/A Periwound Skin Texture: Excoriation: No N/A N/A Induration: No Callus: No Crepitus: No Rash: No Scarring: No Periwound Skin Moisture: Dry/Scaly: Yes N/A N/A Maceration: No Periwound Skin Color: Atrophie Blanche: No N/A N/A Cyanosis: No Ecchymosis: No Erythema: No Hemosiderin Staining: No Mottled: No Pallor: No Rubor: No Temperature: No Abnormality N/A N/A Tenderness on Palpation: Yes N/A N/A Wound Preparation: Ulcer Cleansing: Other: soap N/A N/A and water Topical Anesthetic  Applied: Other: lidocaine 4% Treatment Notes Electronic Signature(s) Signed: 07/11/2018 5:42:07 PM By: Linton Ham MD Entered By: Linton Ham on 07/11/2018 09:56:48 Scales, Skylah L. ([id number]) -------------------------------------------------------------------------------- [place] Details Patient Name: IKRAM, RIEBE. Date of Service: 07/11/2018 9:15 AM Medical Record Number: [id number] Patient Account Number: [id number] Date of Birth/Sex: 10-30-1945 (73 y.o. F) Treating RN: Cornell Barman Primary Care Atzin Buchta: Cranford Mon, Delfino Lovett Other Clinician: Referring Corrado Hymon: Cranford Mon, RICHARD Treating Alixandria Friedt/Extender: Tito Dine in Treatment: 3 Active Inactive Abuse / Safety / Falls / Self Care Management Nursing Diagnoses: Impaired physical mobility Potential for falls Goals: Patient will remain injury free related to falls Date Initiated: 06/20/2018 Target Resolution Date: 07/19/2018 Goal Status: Active Interventions: Assess fall risk on admission and as needed Notes: Orientation to the Wound Care Program Nursing Diagnoses: Knowledge deficit related to the wound healing center program Goals: Patient/caregiver will verbalize understanding of the [facility] Date Initiated: 06/20/2018 Target Resolution Date: 06/20/2018 Goal Status: Active Interventions: Provide education on orientation to the wound center Notes: Wound/Skin Impairment Nursing Diagnoses: Impaired tissue integrity Goals: Ulcer/skin breakdown will have a volume reduction of 30% by week 4 Date Initiated: 06/20/2018 Target Resolution Date: 07/19/2018 Goal Status: Active Interventions: Turpin, Emiley L. ([id number]) Assess patient/caregiver ability to obtain necessary supplies Assess patient/caregiver ability to perform ulcer/skin care regimen upon admission and as needed Assess ulceration(s) every visit Provide education on ulcer and skin  care Notes: Electronic Signature(s) Signed: 07/11/2018 5:23:21 PM By: Gretta Cool, BSN, RN, CWS, Kim RN, BSN Entered By: Gretta Cool, BSN, RN, CWS, Kim on 07/11/2018 09:39:05 Jacot, Ayden L. ([id number]) -------------------------------------------------------------------------------- Pain Assessment Details Patient Name: KASSIDIE, HENDRIKS. Date of Service: 07/11/2018 9:15 AM Medical Record Number: [id number] Patient Account Number: [id number] Date of Birth/Sex: 1945/07/03 (73 y.o. F) Treating RN: Army Melia Primary Care Sweet Jarvis: [name] Other Clinician: Referring Mainor Hellmann: [name] Treating Jissel Slavens/Extender: Tito Dine in Treatment: 3 Active Problems Location of Pain Severity and Description of Pain Patient Has Paino No Site Locations Pain Management and Medication Current Pain Management: Electronic Signature(s) Signed: 07/11/2018 11:45:59 AM By: Army Melia Entered By: Army Melia on 07/11/2018 09:19:36 Bouchillon, Kealohilani L. ([id number]) -------------------------------------------------------------------------------- Patient/Caregiver Education Details Patient Name: LEANNDRA, PEMBER. Date of Service: 07/11/2018 9:15 AM Medical Record Number: [id number] Patient Account Number: [id number] Date of Birth/Gender: [birth year] (73 y.o. F) Treating RN: Cornell Barman Primary Care Physician:  [name] Other Clinician: Referring Physician: [name] Treating Physician/Extender: Tito Dine in Treatment: 3 Education Assessment Education Provided To: Patient Education Topics Provided Venous: Handouts: Controlling Swelling with Multilayered Compression Wraps Methods: Demonstration, Explain/Verbal Responses: State content correctly Electronic Signature(s) Signed: 07/11/2018 5:23:21 PM By: Gretta Cool, BSN, RN, CWS, Kim RN, BSN Entered By: Gretta Cool, BSN, RN, CWS, Kim on 07/11/2018 09:42:13 Czerwonka, Umaiza L.  ([id number]) -------------------------------------------------------------------------------- Wound Assessment Details Patient Name: MARELYN, ROUSER. Date of Service: 07/11/2018 9:15 AM Medical Record Number: [id number] Patient Account Number: [id number] Date of Birth/Sex: 12/28/1945 (73 y.o. F) Treating RN: Army Melia Primary Care Honour Schwieger: Cranford Mon, Delfino Lovett Other Clinician: Referring Mcguire Gasparyan: Cranford Mon, RICHARD Treating Mayetta Castleman/Extender: Tito Dine in Treatment: 3 Wound Status Wound Number: 1 Primary Diabetic Wound/Ulcer of the Lower Extremity Etiology: Wound Location: Left Lower Leg - Posterior Secondary Lymphedema Wounding Event: Gradually Appeared Etiology: Date Acquired: 02/26/2018 Wound Open Weeks Of Treatment: 3 Status: Clustered Wound: No Comorbid Lymphedema, Hypertension, Peripheral Venous History: Disease, Type II Diabetes, End Stage Renal Disease, Osteoarthritis, Neuropathy, Received Radiation Photos Photo Uploaded By: Army Melia on 07/11/2018 09:37:16 Wound Measurements Length: (cm) 0.5 Width: (cm) 0.3 Depth: (cm) 0.1 Area: (cm) 0.118 Volume: (cm) 0.012 % Reduction in Area: 99.2% % Reduction in Volume: 99.2% Epithelialization: Medium (34-66%) Tunneling: No Undermining: No Wound Description Classification: Grade 1 Wound Margin: Indistinct, nonvisible Exudate Amount: Small Exudate Type: Serous Exudate Color: amber Foul Odor After Cleansing: No Slough/Fibrino Yes Wound Bed Granulation Amount: Medium (34-66%) Exposed Structure Granulation Quality: Pink Fascia Exposed: No Necrotic Amount: Medium (34-66%) Fat Layer (Subcutaneous Tissue) Exposed: Yes Necrotic Quality: Eschar, Adherent Slough Tendon Exposed: No Muscle Exposed: No Joint Exposed: No Facey, Daniele L. ([id number]) Bone Exposed: No Periwound Skin Texture Texture Color No Abnormalities Noted: No No Abnormalities Noted: No Callus: No Atrophie Blanche: No Crepitus:  No Cyanosis: No Excoriation: No Ecchymosis: No Induration: No Erythema: No Rash: No Hemosiderin Staining: No Scarring: No Mottled: No Pallor: No Moisture Rubor: No No Abnormalities Noted: No Dry / Scaly: Yes Temperature / Pain Maceration: No Temperature: No Abnormality Tenderness on Palpation: Yes Wound Preparation Ulcer Cleansing: Other: soap and water, Topical Anesthetic Applied: Other: lidocaine 4%, Treatment Notes Wound #1 (Left, Posterior Lower Leg) Notes Silver alginate, ABD, 4 layer Electronic Signature(s) Signed: 07/11/2018 11:45:59 AM By: Army Melia Entered By: Army Melia on 07/11/2018 09:28:08 Garant, Jaskirat L. ([id number]) -------------------------------------------------------------------------------- Vitals Details Patient Name: NIKOLE, SWARTZENTRUBER. Date of Service: 07/11/2018 9:15 AM Medical Record Number: [id number] Patient Account Number: [id number] Date of Birth/Sex: [birth year] (73 y.o. F) Treating RN: Army Melia Primary Care Adithi Gammon: Cranford Mon, Delfino Lovett Other Clinician: Referring Lorelee Mclaurin: Cranford Mon, RICHARD Treating Jeneva Schweizer/Extender: Tito Dine in Treatment: 3 Vital Signs Time Taken: 09:19 Temperature (F): 98.5 Height (in): 63 Pulse (bpm): 60 Weight (lbs): 257 Respiratory Rate (breaths/min): 16 Body Mass Index (BMI): 45.5 Blood Pressure (mmHg): 188/47 Reference Range: 80 - 120 mg / dl Electronic Signature(s) Signed: 07/11/2018 11:45:59 AM By: Army Melia Entered By: Army Melia on 07/11/2018 09:21:21

## 2018-07-18 ENCOUNTER — Other Ambulatory Visit: Payer: Self-pay

## 2018-07-18 ENCOUNTER — Encounter: Payer: Medicare Other | Admitting: Internal Medicine

## 2018-07-18 DIAGNOSIS — S81802A Unspecified open wound, left lower leg, initial encounter: Secondary | ICD-10-CM | POA: Diagnosis not present

## 2018-07-18 DIAGNOSIS — I87312 Chronic venous hypertension (idiopathic) with ulcer of left lower extremity: Secondary | ICD-10-CM | POA: Diagnosis not present

## 2018-07-18 DIAGNOSIS — I872 Venous insufficiency (chronic) (peripheral): Secondary | ICD-10-CM | POA: Diagnosis not present

## 2018-07-18 DIAGNOSIS — L97221 Non-pressure chronic ulcer of left calf limited to breakdown of skin: Secondary | ICD-10-CM | POA: Diagnosis not present

## 2018-07-18 DIAGNOSIS — Z853 Personal history of malignant neoplasm of breast: Secondary | ICD-10-CM | POA: Diagnosis not present

## 2018-07-18 DIAGNOSIS — I890 Lymphedema, not elsewhere classified: Secondary | ICD-10-CM | POA: Diagnosis not present

## 2018-07-18 DIAGNOSIS — E11622 Type 2 diabetes mellitus with other skin ulcer: Secondary | ICD-10-CM | POA: Diagnosis not present

## 2018-07-19 NOTE — Progress Notes (Signed)
Ramsey, [name] ([id number]) Visit Report for 07/18/2018 HPI Details Patient Name: Carol Ramsey, Carol Ramsey. Date of Service: 07/18/2018 3:30 PM Medical Record Number: [id number] Patient Account Number: [id number] Date of Birth/Sex: 20-Aug-1945 (73 y.o. F) Treating RN: Carol Ramsey Primary Care Provider: Cranford Mon, Delfino Lovett Other Clinician: Referring Provider: Cranford Mon, Delfino Lovett Treating Provider/Extender: Carol Ramsey in Treatment: 4 History of Present Illness HPI Description: ADMISSION 06/20/2018 This is a 73 year old woman who has known problems with chronic venous insufficiency and bilateral lower extremity edema. She is also a type II diabetic with recent hemoglobin A1c of 7.2. She tells me in the last 3 to 4 months she is developed a weeping nonhealing area on the left posterior calf. There was no obvious trauma here. She thought she might of had a skin tag on the back of this and was scratching and wonder is if this actually caused the area. She is simply been putting dry gauze on the top of this. She has not been using compression stockings and does not think she would be able to get these on. She does have compression pumps and she says she uses them once a day on most days. She was seen at her primary doctor's office on April 10, 2018 at which time she was felt to have wound cellulitis. She was followed up a week later and given Lasix 20 mg. Culture of the wound grew Pseudomonas and staph and was given a combination of doxycycline transitioning to Cipro. She tells me she follows at a vein clinic in [place]. She had some form of what sounds like sclerotherapy on the medial right upper thigh just above her knee yesterday. Past medical history includes ductal carcinoma of the right breast, type 2 diabetes, venous insufficiency with bilateral lower extremity edema, sciatica, left total knee replacement, hyperthyroidism and hyperlipidemia ABIs on the right were 0.96 and noncompressible on  the left 2/26; patient we admitted to the clinic last week. She has severe bilateral lymphedema. Since she was last here she appears to have had an ablation on the right leg probably the small saphenous vein at a vein clinic in [place]. They are apparently looking to do the same on the left. Her wound areas on the posterior part of the left calf small draining open area with some surrounding skin which is erythematous macerated and looks at some risk. We put her in 3 layer compression last week. She is telling me she uses her pumps twice a day. We have some improvement in the overall edema but not as much as I was hoping 3/4; patient we admitted to the clinic 2 weeks ago. She has severe bilateral lymphedema. The wound areas on the left posterior calf. She is also going to a vein clinic in [place] and she tells me that she actually had ablations done on the left leg prior to her coming here. I knew she had the right leg done last week but I was not aware that they had previously worked on the left leg. She tells me she is going for an ultrasound tomorrow. 3/11; we have been putting 4-layer compression on the left leg. Small open wound on the left posterior calf is better this week with improved dimensions. She has had venous ablations and tells me she is going to start using her compression pumps today. She has a single external compression garment stocking that we will put on the right leg today and make sure that she can actually do this herself. If she  can then we will order one for the left leg. She was cautioned that she can use her compression pumps either over our wraps or compression stockings of any form 3/18-Patient returns to clinic with severe bilateral lymphedema, she had taken the left 4 layer wrap off on Sunday on account of pain in the upper part of the ankle, patient has had some weeping and drainage. She claims to be using the pumps as indicated. We are going to move to a 3 layer  compression to reduce the discomfort level. The wound itself appears to be doing better on the left medial aspect of left leg Electronic Signature(s) Signed: 07/18/2018 4:15:56 PM By: Ramsey Right, [name] ([id number]) Entered By: Carol Ramsey on 07/18/2018 16:15:56 Brayman, [name] ([id number]) -------------------------------------------------------------------------------- Physical Exam Details Patient Name: Carol Ramsey, [name] Date of Service: 07/18/2018 3:30 PM Medical Record Number: [id number] Patient Account Number: [id number] Date of Birth/Sex: [birth year] (73 y.o. F) Treating RN: Carol Ramsey Primary Care Provider: Cranford Mon, Delfino Lovett Other Clinician: Referring Provider: Cranford Mon, RICHARD Treating Provider/Extender: Carol Ramsey in Treatment: 4 Cardiovascular 4+ pitting edema of the bilateral lower extremities. Notes Wound exam-the left lower extremity medial calf wound appears small and looks like it can heal any time. Significant edema noted in both legs with changes. Electronic Signature(s) Signed: 07/18/2018 4:16:49 PM By: Carol Ramsey Entered By: Carol Ramsey on 07/18/2018 16:16:49 Sagen, [place] ([id number]) -------------------------------------------------------------------------------- Physician Orders Details Patient Name: Carol Ramsey, Carol Ramsey. Date of Service: 07/18/2018 3:30 PM Medical Record Number: [id number] Patient Account Number: [id number] Date of Birth/Sex: [birth year] (73 y.o. F) Treating RN: Carol Ramsey Primary Care Provider: Cranford Mon, Delfino Lovett Other Clinician: Referring Provider: Cranford Mon, RICHARD Treating Provider/Extender: Carol Ramsey in Treatment: 4 Verbal / Phone Orders: No Diagnosis Coding Wound Cleansing Wound #1 Left,Posterior Lower Leg o May shower with protection. - Cast Protector Skin Barriers/Peri-Wound Care Wound #1 Left,Posterior Lower Leg o Triamcinolone Acetonide Ointment (TCA) - peri-wound only Primary  Wound Dressing Wound #1 Left,Posterior Lower Leg o Silver Alginate Secondary Dressing Wound #1 Left,Posterior Lower Leg o ABD pad - Drawtex Dressing Change Frequency Wound #1 Left,Posterior Lower Leg o Change dressing every week Follow-up Appointments Wound #1 Left,Posterior Lower Leg o Return Appointment in 1 week. o Nurse Visit as needed Edema Control Wound #1 Left,Posterior Lower Leg o 3 Layer Compression System - Left Lower Extremity o Patient to wear own compression stockings - Farrow on right leg Additional Orders / Instructions Wound #1 Left,Posterior Lower Leg o Other: - Use pumps 2 times per day, every day Electronic Signature(s) Signed: 07/18/2018 4:41:49 PM By: Carol Ramsey Signed: 07/18/2018 5:54:38 PM By: Gretta Cool, BSN, RN, CWS, Kim RN, BSN Entered By: Gretta Cool, BSN, RN, CWS, Kim on 07/18/2018 16:13:50 Zeidan, Sara L. ([id number]) Idler, Stassi L. ([id number]) -------------------------------------------------------------------------------- Progress Note Details Patient Name: Carol Ramsey, Carol Ramsey. Date of Service: 07/18/2018 3:30 PM Medical Record Number: [id number] Patient Account Number: [id number] Date of Birth/Sex: 11/17/1945 (73 y.o. F) Treating RN: Carol Ramsey Primary Care Provider: Cranford Mon, Delfino Lovett Other Clinician: Referring Provider: Cranford Mon, Delfino Lovett Treating Provider/Extender: Carol Ramsey in Treatment: 4 Subjective History of Present Illness (HPI) ADMISSION 06/20/2018 This is a 73 year old woman who has known problems with chronic venous insufficiency and bilateral lower extremity edema. She is also a type II diabetic with recent hemoglobin A1c of 7.2. She tells me in the last 3 to 4 months she is developed a weeping nonhealing area on the left posterior calf. There was no obvious trauma here.  She thought she might of had a skin tag on the back of this and was scratching and wonder is if this actually caused the area. She is simply been  putting dry gauze on the top of this. She has not been using compression stockings and does not think she would be able to get these on. She does have compression pumps and she says she uses them once a day on most days. She was seen at her primary doctor's office on April 10, 2018 at which time she was felt to have wound cellulitis. She was followed up a week later and given Lasix 20 mg. Culture of the wound grew Pseudomonas and staph and was given a combination of doxycycline transitioning to Cipro. She tells me she follows at a vein clinic in [place]. She had some form of what sounds like sclerotherapy on the medial right upper thigh just above her knee yesterday. Past medical history includes ductal carcinoma of the right breast, type 2 diabetes, venous insufficiency with bilateral lower extremity edema, sciatica, left total knee replacement, hyperthyroidism and hyperlipidemia ABIs on the right were 0.96 and noncompressible on the left 2/26; patient we admitted to the clinic last week. She has severe bilateral lymphedema. Since she was last here she appears to have had an ablation on the right leg probably the small saphenous vein at a vein clinic in [place]. They are apparently looking to do the same on the left. Her wound areas on the posterior part of the left calf small draining open area with some surrounding skin which is erythematous macerated and looks at some risk. We put her in 3 layer compression last week. She is telling me she uses her pumps twice a day. We have some improvement in the overall edema but not as much as I was hoping 3/4; patient we admitted to the clinic 2 weeks ago. She has severe bilateral lymphedema. The wound areas on the left posterior calf. She is also going to a vein clinic in [place] and she tells me that she actually had ablations done on the left leg prior to her coming here. I knew she had the right leg done last week but I was not aware that they had  previously worked on the left leg. She tells me she is going for an ultrasound tomorrow. 3/11; we have been putting 4-layer compression on the left leg. Small open wound on the left posterior calf is better this week with improved dimensions. She has had venous ablations and tells me she is going to start using her compression pumps today. She has a single external compression garment stocking that we will put on the right leg today and make sure that she can actually do this herself. If she can then we will order one for the left leg. She was cautioned that she can use her compression pumps either over our wraps or compression stockings of any form 3/18-Patient returns to clinic with severe bilateral lymphedema, she had taken the left 4 layer wrap off on Sunday on account of pain in the upper part of the ankle, patient has had some weeping and drainage. She claims to be using the pumps as indicated. We are going to move to a 3 layer compression to reduce the discomfort level. The wound itself appears to be doing better on the left medial aspect of left leg Carol Ramsey, [name] ([id number]) Objective Constitutional Vitals Time Taken: 3:50 PM, Height: 63 in, Weight: 257 lbs, BMI: 45.5, Temperature:  98.1 F, Pulse: 58 bpm, Respiratory Rate: 18 breaths/min, Blood Pressure: 160/78 mmHg. Cardiovascular 4+ pitting edema of the bilateral lower extremities. General Notes: Wound exam-the left lower extremity medial calf wound appears small and looks like it can heal any time. Significant edema noted in both legs with changes. Integumentary (Hair, Skin) Wound #1 status is Open. Original cause of wound was Gradually Appeared. The wound is located on the Left,Posterior Lower Leg. The wound measures 0.6cm length x 0.5cm width x 0.1cm depth; 0.236cm^2 area and 0.024cm^3 volume. There is Fat Layer (Subcutaneous Tissue) Exposed exposed. There is no tunneling or undermining noted. There is a large amount of  serous drainage noted. The wound margin is indistinct and nonvisible. There is medium (34-66%) pink granulation within the wound bed. There is a medium (34-66%) amount of necrotic tissue within the wound bed including Adherent Slough. The periwound skin appearance exhibited: Dry/Scaly. The periwound skin appearance did not exhibit: Callus, Crepitus, Excoriation, Induration, Rash, Scarring, Maceration, Atrophie Blanche, Cyanosis, Ecchymosis, Hemosiderin Staining, Mottled, Pallor, Rubor, Erythema. Periwound temperature was noted as No Abnormality. The periwound has tenderness on palpation. Plan Wound Cleansing: Wound #1 Left,Posterior Lower Leg: May shower with protection. - Cast Protector Skin Barriers/Peri-Wound Care: Wound #1 Left,Posterior Lower Leg: Triamcinolone Acetonide Ointment (TCA) - peri-wound only Primary Wound Dressing: Wound #1 Left,Posterior Lower Leg: Silver Alginate Secondary Dressing: Wound #1 Left,Posterior Lower Leg: ABD pad - Drawtex Dressing Change Frequency: Wound #1 Left,Posterior Lower Leg: Change dressing every week Follow-up Appointments: Wound #1 Left,Posterior Lower Leg: Return Appointment in 1 week. Nurse Visit as needed Edema Control: Wound #1 Left,Posterior Lower Leg: 3 Layer Compression System - Left Lower Extremity Patient to wear own compression stockings - Farrow on right leg Additional Orders / Instructions: Carol Ramsey, [name] ([id number]) Wound #1 Left,Posterior Lower Leg: Other: - Use pumps 2 times per day, every day 1. 3 layer compression wrap to the left leg 2. Continue the same silver alginate dressing 3. Triamcinolone cream to periwound area 4. Emphasized using pumps as recommended Electronic Signature(s) Signed: 07/18/2018 4:17:35 PM By: Carol Ramsey Entered By: Carol Ramsey on 07/18/2018 16:17:35 Carol Ramsey, [name] ([id number]) -------------------------------------------------------------------------------- SuperBill  Details Patient Name: Carol Ramsey, Carol Ramsey. Date of Service: 07/18/2018 Medical Record Number: [id number] Patient Account Number: [id number] Date of Birth/Sex: 01/17/1946 (73 y.o. F) Treating RN: Carol Ramsey Primary Care Provider: Cranford Mon, Delfino Lovett Other Clinician: Referring Provider: Cranford Mon, Delfino Lovett Treating Provider/Extender: Carol Ramsey in Treatment: 4 Diagnosis Coding ICD-10 Codes Code Description [phone] Non-pressure chronic ulcer of left calf limited to breakdown of skin I89.0 Lymphedema, not elsewhere classified I87.312 Chronic venous hypertension (idiopathic) with ulcer of left lower extremity Facility Procedures CPT4 Code: 16073710 Description: (Facility Use Only) [id number] - [place] LWR LT LEG Modifier: Quantity: 1 Physician Procedures CPT4 Code: 4627035 Description: 00938 - WC PHYS LEVEL 3 - EST PT ICD-10 Diagnosis Description L97.221 Non-pressure chronic ulcer of left calf limited to breakdown Modifier: of skin Quantity: 1 Electronic Signature(s) Signed: 07/18/2018 4:18:34 PM By: Carol Ramsey Entered By: Carol Ramsey on 07/18/2018 16:18:33

## 2018-07-24 NOTE — Progress Notes (Signed)
Ramsey, [name] ([id number]) Visit Report for 07/18/2018 Arrival Information Details Patient Name: Carol, Ramsey. Date of Service: 07/18/2018 3:30 PM Medical Record Number: [id number] Patient Account Number: [id number] Date of Birth/Sex: 01/19/1946 (73 y.o. F) Treating RN: Carol Ramsey Primary Care Carol Ramsey: Ramsey Mon, Carol Ramsey Other Clinician: Referring Lynnlee Revels: Ramsey Mon, Carol Ramsey Treating Carol Ramsey/Extender: Carol Ramsey in Treatment: 4 Visit Information History Since Last Visit Added or deleted any medications: No Patient Arrived: Cane Any new allergies or adverse reactions: No Arrival Time: 15:50 Had a fall or experienced change in No Accompanied By: family activities of daily living that may affect Transfer Assistance: None risk of falls: Patient Identification Verified: Yes Signs or symptoms of abuse/neglect since last visito No Secondary Verification Process Completed: Yes Hospitalized since last visit: No Patient Has Alerts: Yes Has Dressing in Place as Prescribed: No Patient Alerts: DMII Has Compression in Place as Prescribed: No Pain Present Now: No Electronic Signature(s) Signed: 07/24/2018 9:04:30 AM By: Carol Ramsey Entered By: Carol Ramsey on 07/18/2018 15:52:08 Ramsey, [name] ([id number]) -------------------------------------------------------------------------------- Encounter Discharge Information Details Patient Name: Carol, Ramsey. Date of Service: 07/18/2018 3:30 PM Medical Record Number: [id number] Patient Account Number: [id number] Date of Birth/Sex: 1946/04/06 (73 y.o. F) Treating RN: Carol Ramsey Primary Care Rikita Grabert: Ramsey Mon, Carol Ramsey Other Clinician: Referring Teneisha Gignac: Ramsey Mon, Carol Treating Gisselle Galvis/Extender: Carol Ramsey in Treatment: 4 Encounter Discharge Information Items Discharge Condition: Stable Ambulatory Status: Cane Discharge Destination: Home Transportation: Private Auto Accompanied By:  family Schedule Follow-up Appointment: Yes Clinical Summary of Care: Electronic Signature(s) Signed: 07/18/2018 5:54:38 PM By: Carol Ramsey, BSN, RN, CWS, Kim RN, BSN Entered By: Carol Ramsey, BSN, RN, CWS, Carol Ramsey on 07/18/2018 16:33:40 Ramsey, [name] ([id number]) -------------------------------------------------------------------------------- Lower Extremity Assessment Details Patient Name: Carol, Ramsey. Date of Service: 07/18/2018 3:30 PM Medical Record Number: [id number] Patient Account Number: [id number] Date of Birth/Sex: 14-Feb-1946 (73 y.o. F) Treating RN: Carol Ramsey Primary Care Jaiveon Suppes: Ramsey Mon, Carol Ramsey Other Clinician: Referring Mirissa Lopresti: Ramsey Mon, Carol Treating Zakirah Weingart/Extender: Carol Ramsey in Treatment: 4 Edema Assessment Assessed: [Left: No] [Right: No] [Left: Edema] [Right: :] Calf Left: Right: Point of Measurement: 34 cm From Medial Instep 57.3 cm cm Ankle Left: Right: Point of Measurement: 10 cm From Medial Instep 26 cm cm Vascular Assessment Pulses: Dorsalis Pedis Palpable: [Left:Yes] Posterior Tibial Palpable: [Left:Yes] Extremity colors, hair growth, and conditions: Hair Growth on Extremity: [Left:No] Temperature of Extremity: [Left:Warm] Capillary Refill: [Left:< 3 seconds] Toe Nail Assessment Left: Right: Thick: No Discolored: No Deformed: No Improper Length and Hygiene: No Electronic Signature(s) Signed: 07/24/2018 9:04:30 AM By: Carol Ramsey Entered By: Carol Ramsey on 07/18/2018 16:01:51 Ramsey, [name] ([id number]) -------------------------------------------------------------------------------- Multi Wound Chart Details Patient Name: Carol, Ramsey. Date of Service: 07/18/2018 3:30 PM Medical Record Number: [id number] Patient Account Number: [id number] Date of Birth/Sex: 1945/10/25 (73 y.o. F) Treating RN: Carol Ramsey Primary Care Carol Ramsey: Ramsey Mon, Carol Ramsey Other Clinician: Referring Mileigh Tilley: Ramsey Mon, Carol Ramsey Treating  Carol Ramsey/Extender: Carol Ramsey in Treatment: 4 Vital Signs Height(in): 63 Pulse(bpm): 46 Weight(lbs): 257 Blood Pressure(mmHg): 160/78 Body Mass Index(BMI): 46 Temperature(F): 98.1 Respiratory Rate 18 (breaths/min): Photos: [N/A:N/A] Wound Location: Left Lower Leg - Posterior N/A N/A Wounding Event: Gradually Appeared N/A N/A Primary Etiology: Diabetic Wound/Ulcer of the N/A N/A Lower Extremity Secondary Etiology: Lymphedema N/A N/A Comorbid History: Lymphedema, Hypertension, N/A N/A Peripheral Venous Disease, Type II Diabetes, End Stage Renal Disease, Osteoarthritis, Neuropathy, Received Radiation Date Acquired: 02/26/2018 N/A N/A Weeks of Treatment: 4 N/A N/A Wound Status: Open N/A N/A Measurements  L x W x D 0.6x0.5x0.1 N/A N/A (cm) Area (cm) : 0.236 N/A N/A Volume (cm) : 0.024 N/A N/A % Reduction in Area: 98.40% N/A N/A % Reduction in Volume: 98.40% N/A N/A Classification: Grade 1 N/A N/A Exudate Amount: Large N/A N/A Exudate Type: Serous N/A N/A Exudate Color: amber N/A N/A Wound Margin: Indistinct, nonvisible N/A N/A Granulation Amount: Medium (34-66%) N/A N/A Granulation Quality: Pink N/A N/A Necrotic Amount: Medium (34-66%) N/A N/A Exposed Structures: N/A N/A Ramsey, [name] ([id number]) Fat Layer (Subcutaneous Tissue) Exposed: Yes Fascia: No Tendon: No Muscle: No Joint: No Bone: No Epithelialization: Medium (34-66%) N/A N/A Periwound Skin Texture: Excoriation: No N/A N/A Induration: No Callus: No Crepitus: No Rash: No Scarring: No Periwound Skin Moisture: Dry/Scaly: Yes N/A N/A Maceration: No Periwound Skin Color: Atrophie Blanche: No N/A N/A Cyanosis: No Ecchymosis: No Erythema: No Hemosiderin Staining: No Mottled: No Pallor: No Rubor: No Temperature: No Abnormality N/A N/A Tenderness on Palpation: Yes N/A N/A Wound Preparation: Ulcer Cleansing: Other: soap N/A N/A and water Topical Anesthetic Applied: Other:  lidocaine 4% Treatment Notes Electronic Signature(s) Signed: 07/18/2018 5:54:38 PM By: Carol Ramsey, BSN, RN, CWS, Kim RN, BSN Entered By: Carol Ramsey, BSN, RN, CWS, Carol Ramsey on 07/18/2018 16:12:52 [place], [place] ([id number]) -------------------------------------------------------------------------------- [place] Details Patient Name: Carol, Ramsey. Date of Service: 07/18/2018 3:30 PM Medical Record Number: [id number] Patient Account Number: [id number] Date of Birth/Sex: [birth year] (73 y.o. F) Treating RN: Carol Ramsey Primary Care Samyukta Cura: Ramsey Mon, Carol Ramsey Other Clinician: Referring Nissi Doffing: Ramsey Mon, Carol Ramsey Treating Leslea Vowles/Extender: Carol Ramsey in Treatment: 4 Active Inactive Abuse / Safety / Falls / Self Care Management Nursing Diagnoses: Impaired physical mobility Potential for falls Goals: Patient will remain injury free related to falls Date Initiated: 06/20/2018 Target Resolution Date: 07/19/2018 Goal Status: Active Interventions: Assess fall risk on admission and as needed Notes: Orientation to the Wound Care Program Nursing Diagnoses: Knowledge deficit related to the wound healing center program Goals: Patient/caregiver will verbalize understanding of the [facility] Date Initiated: 06/20/2018 Target Resolution Date: 06/20/2018 Goal Status: Active Interventions: Provide education on orientation to the wound center Notes: Wound/Skin Impairment Nursing Diagnoses: Impaired tissue integrity Goals: Ulcer/skin breakdown will have a volume reduction of 30% by week 4 Date Initiated: 06/20/2018 Target Resolution Date: 07/19/2018 Goal Status: Active Interventions: [place], Savi L. ([id number]) Assess patient/caregiver ability to obtain necessary supplies Assess patient/caregiver ability to perform ulcer/skin care regimen upon admission and as needed Assess ulceration(s) every visit Provide education on ulcer and skin  care Notes: Electronic Signature(s) Signed: 07/18/2018 5:54:38 PM By: Carol Ramsey, BSN, RN, CWS, Kim RN, BSN Entered By: Carol Ramsey, BSN, RN, CWS, Carol Ramsey on 07/18/2018 16:12:20 Carol, Eugene L. ([id number]) -------------------------------------------------------------------------------- Pain Assessment Details Patient Name: Carol, Ramsey. Date of Service: 07/18/2018 3:30 PM Medical Record Number: [id number] Patient Account Number: [id number] Date of Birth/Sex: 21-May-1945 (73 y.o. F) Treating RN: Carol Ramsey Primary Care Jasmyn Picha: Ramsey Mon, Carol Ramsey Other Clinician: Referring Melaney Tellefsen: Ramsey Mon, Carol Ramsey Treating Dolora Ridgely/Extender: Carol Ramsey in Treatment: 4 Active Problems Location of Pain Severity and Description of Pain Patient Has Paino No Site Locations Pain Management and Medication Current Pain Management: Electronic Signature(s) Signed: 07/24/2018 9:04:30 AM By: Carol Ramsey Entered By: Carol Ramsey on 07/18/2018 15:52:17 Carol, Miria L. ([id number]) -------------------------------------------------------------------------------- Patient/Caregiver Education Details Patient Name: Carol, Ramsey. Date of Service: 07/18/2018 3:30 PM Medical Record Number: [id number] Patient Account Number: [id number] Date of Birth/Gender: Nov 25, 1945 (73 y.o. F) Treating RN: Carol Ramsey Primary Care Physician: Ramsey Mon, Carol Ramsey  Other Clinician: Referring Physician: [place] Mon, Carol Ramsey Treating Physician/Extender: Carol Ramsey in Treatment: 4 Education Assessment Education Provided To: Patient Education Topics Provided Wound/Skin Impairment: Handouts: Caring for Your Ulcer Methods: Demonstration, Explain/Verbal Responses: State content correctly Electronic Signature(s) Signed: 07/18/2018 5:54:38 PM By: Carol Ramsey, BSN, RN, CWS, Kim RN, BSN Entered By: Carol Ramsey, BSN, RN, CWS, Carol Ramsey on 07/18/2018 16:33:47 Carol, Johnisha L.  ([id number]) -------------------------------------------------------------------------------- Wound Assessment Details Patient Name: Carol, Ramsey. Date of Service: 07/18/2018 3:30 PM Medical Record Number: [id number] Patient Account Number: [id number] Date of Birth/Sex: November 07, 1945 (73 y.o. F) Treating RN: Carol Ramsey Primary Care Mazzie Brodrick: Ramsey Mon, Carol Ramsey Other Clinician: Referring Donathan Buller: Ramsey Mon, Carol Ramsey Treating Rayn Shorb/Extender: Carol Ramsey in Treatment: 4 Wound Status Wound Number: 1 Primary Diabetic Wound/Ulcer of the Lower Extremity Etiology: Wound Location: Left Lower Leg - Posterior Secondary Lymphedema Wounding Event: Gradually Appeared Etiology: Date Acquired: 02/26/2018 Wound Open Weeks Of Treatment: 4 Status: Clustered Wound: No Comorbid Lymphedema, Hypertension, Peripheral Venous History: Disease, Type II Diabetes, End Stage Renal Disease, Osteoarthritis, Neuropathy, Received Radiation Photos Wound Measurements Length: (cm) 0.6 % Reduction Width: (cm) 0.5 % Reduction Depth: (cm) 0.1 Epithelializ Area: (cm) 0.236 Tunneling: Volume: (cm) 0.024 Undermining in Area: 98.4% in Volume: 98.4% ation: Medium (34-66%) No : No Wound Description Classification: Grade 1 Foul Odor Af Wound Margin: Indistinct, nonvisible Slough/Fibri Exudate Amount: Large Exudate Type: Serous Exudate Color: amber ter Cleansing: No no Yes Wound Bed Granulation Amount: Medium (34-66%) Exposed Structure Granulation Quality: Pink Fascia Exposed: No Necrotic Amount: Medium (34-66%) Fat Layer (Subcutaneous Tissue) Exposed: Yes Necrotic Quality: Adherent Slough Tendon Exposed: No Muscle Exposed: No Joint Exposed: No Bone Exposed: No Seliga, [name] ([id number]) Periwound Skin Texture Texture Color No Abnormalities Noted: No No Abnormalities Noted: No Callus: No Atrophie Blanche: No Crepitus: No Cyanosis: No Excoriation: No Ecchymosis:  No Induration: No Erythema: No Rash: No Hemosiderin Staining: No Scarring: No Mottled: No Pallor: No Moisture Rubor: No No Abnormalities Noted: No Dry / Scaly: Yes Temperature / Pain Maceration: No Temperature: No Abnormality Tenderness on Palpation: Yes Wound Preparation Ulcer Cleansing: Other: soap and water, Topical Anesthetic Applied: Other: lidocaine 4%, Treatment Notes Wound #1 (Left, Posterior Lower Leg) Notes Silver alginate, ABD, drawtex, 3layer Electronic Signature(s) Signed: 07/24/2018 9:04:30 AM By: Carol Ramsey Entered By: Carol Ramsey on 07/18/2018 16:00:59 Carol, Kayra L. ([id number]) -------------------------------------------------------------------------------- Vitals Details Patient Name: Carol, Ramsey. Date of Service: 07/18/2018 3:30 PM Medical Record Number: [id number] Patient Account Number: [id number] Date of Birth/Sex: May 29, 1945 (73 y.o. F) Treating RN: Carol Ramsey Primary Care Jodey Burbano: Ramsey Mon, Carol Ramsey Other Clinician: Referring Torah Pinnock: Ramsey Mon, Carol Ramsey Treating Lovelyn Sheeran/Extender: Carol Ramsey in Treatment: 4 Vital Signs Time Taken: 15:50 Temperature (F): 98.1 Height (in): 63 Pulse (bpm): 58 Weight (lbs): 257 Respiratory Rate (breaths/min): 18 Body Mass Index (BMI): 45.5 Blood Pressure (mmHg): 160/78 Reference Range: 80 - 120 mg / dl Electronic Signature(s) Signed: 07/24/2018 9:04:30 AM By: Carol Ramsey Entered By: Carol Ramsey on 07/18/2018 15:56:19

## 2018-07-25 ENCOUNTER — Other Ambulatory Visit: Payer: Self-pay

## 2018-07-25 ENCOUNTER — Encounter: Payer: Medicare Other | Admitting: Internal Medicine

## 2018-07-25 DIAGNOSIS — Z853 Personal history of malignant neoplasm of breast: Secondary | ICD-10-CM | POA: Diagnosis not present

## 2018-07-25 DIAGNOSIS — I872 Venous insufficiency (chronic) (peripheral): Secondary | ICD-10-CM | POA: Diagnosis not present

## 2018-07-25 DIAGNOSIS — I890 Lymphedema, not elsewhere classified: Secondary | ICD-10-CM | POA: Diagnosis not present

## 2018-07-25 DIAGNOSIS — L97222 Non-pressure chronic ulcer of left calf with fat layer exposed: Secondary | ICD-10-CM | POA: Diagnosis not present

## 2018-07-25 DIAGNOSIS — L97221 Non-pressure chronic ulcer of left calf limited to breakdown of skin: Secondary | ICD-10-CM | POA: Diagnosis not present

## 2018-07-25 DIAGNOSIS — E11622 Type 2 diabetes mellitus with other skin ulcer: Secondary | ICD-10-CM | POA: Diagnosis not present

## 2018-07-25 DIAGNOSIS — I87312 Chronic venous hypertension (idiopathic) with ulcer of left lower extremity: Secondary | ICD-10-CM | POA: Diagnosis not present

## 2018-07-26 NOTE — Progress Notes (Signed)
Fofana, Kaedyn L. ([id number]) Visit Report for 07/25/2018 Arrival Information Details Patient Name: Carol Ramsey, Carol Ramsey. Date of Service: 07/25/2018 8:30 AM Medical Record Number: [id number] Patient Account Number: [id number] Date of Birth/Sex: 1945-10-14 (73 y.o. F) Treating RN: Cornell Barman Primary Care Mohmed Farver: Cranford Mon, Delfino Lovett Other Clinician: Referring Reymundo Winship: Cranford Mon, RICHARD Treating Marelin Tat/Extender: Tito Dine in Treatment: 5 Visit Information History Since Last Visit Added or deleted any medications: No Patient Arrived: Ambulatory Any new allergies or adverse reactions: No Arrival Time: 08:35 Had a fall or experienced change in No Accompanied By: self activities of daily living that may affect Transfer Assistance: None risk of falls: Patient Identification Verified: Yes Signs or symptoms of abuse/neglect since last visito No Secondary Verification Process Completed: Yes Hospitalized since last visit: No Patient Has Alerts: Yes Implantable device outside of the clinic excluding No Patient Alerts: DMII cellular tissue based products placed in the center since last visit: Has Dressing in Place as Prescribed: Yes Pain Present Now: No Electronic Signature(s) Signed: 07/25/2018 11:34:54 AM By: Lorine Bears RCP, RRT, CHT Entered By: Lorine Bears on 07/25/2018 08:35:48 Kuenzi, Mlissa L. ([id number]) -------------------------------------------------------------------------------- Compression Therapy Details Patient Name: Carol Ramsey, Carol Ramsey. Date of Service: 07/25/2018 8:30 AM Medical Record Number: [id number] Patient Account Number: [id number] Date of Birth/Sex: 04/02/1946 (73 y.o. F) Treating RN: Cornell Barman Primary Care Sarae Nicholes: Cranford Mon, Delfino Lovett Other Clinician: Referring Jahleel Stroschein: Cranford Mon, RICHARD Treating Sarahmarie Leavey/Extender: Tito Dine in Treatment: 5 Compression Therapy Performed for Wound Assessment: Wound #1  Left,Posterior Lower Leg Performed By: Clinician Cornell Barman, RN Compression Type: Three Layer Pre Treatment ABI: 1 Post Procedure Diagnosis Same as Pre-procedure Electronic Signature(s) Signed: 07/26/2018 9:14:45 AM By: Gretta Cool, BSN, RN, CWS, Kim RN, BSN Entered By: Gretta Cool, BSN, RN, CWS, Kim on 07/25/2018 09:00:23 Cimino, [place] ([id number]) -------------------------------------------------------------------------------- Encounter Discharge Information Details Patient Name: Carol Ramsey, Carol Ramsey. Date of Service: 07/25/2018 8:30 AM Medical Record Number: [id number] Patient Account Number: [id number] Date of Birth/Sex: 13-Sep-1945 (73 y.o. F) Treating RN: Army Melia Primary Care Taletha Twiford: [name] Other Clinician: Referring Brodee Mauritz: Cranford Mon, RICHARD Treating Chole Driver/Extender: Tito Dine in Treatment: 5 Encounter Discharge Information Items Discharge Condition: Stable Ambulatory Status: Cane Discharge Destination: Home Transportation: Private Auto Accompanied By: self Schedule Follow-up Appointment: Yes Clinical Summary of Care: Electronic Signature(s) Signed: 07/25/2018 9:56:22 AM By: Army Melia Entered By: Army Melia on 07/25/2018 09:15:32 Alcorta, Rosemond L. ([id number]) -------------------------------------------------------------------------------- Lower Extremity Assessment Details Patient Name: Carol Ramsey, Carol Ramsey. Date of Service: 07/25/2018 8:30 AM Medical Record Number: [id number] Patient Account Number: [id number] Date of Birth/Sex: Sep 04, 1945 (73 y.o. F) Treating RN: Harold Barban Primary Care Marabella Popiel: Cranford Mon, Delfino Lovett Other Clinician: Referring Sulma Ruffino: Cranford Mon, RICHARD Treating Tyrhonda Georgiades/Extender: Tito Dine in Treatment: 5 Edema Assessment Assessed: [Left: No] [Right: No] [Left: Edema] [Right: :] Calf Left: Right: Point of Measurement: 34 cm From Medial Instep 56 cm cm Ankle Left: Right: Point of Measurement: 10 cm From  Medial Instep 30.5 cm cm Vascular Assessment Pulses: Dorsalis Pedis Palpable: [Left:Yes] Posterior Tibial Palpable: [Left:Yes] Extremity colors, hair growth, and conditions: Extremity Color: [Left:Red] Hair Growth on Extremity: [Left:Yes] Temperature of Extremity: [Left:Warm < 3 seconds] Toe Nail Assessment Left: Right: Thick: Yes Discolored: Yes Deformed: No Improper Length and Hygiene: No Electronic Signature(s) Signed: 07/26/2018 10:08:57 AM By: Harold Barban Entered By: Harold Barban on 07/25/2018 08:49:53 Richman, Amal L. ([id number]) -------------------------------------------------------------------------------- Multi Wound Chart Details Patient Name: Carol Ramsey, Carol Ramsey. Date of Service: 07/25/2018 8:30 AM Medical Record  Number: [id number] Patient Account Number: [id number] Date of Birth/Sex: [birth year] (73 y.o. F) Treating RN: Cornell Barman Primary Care Ryzen Deady: Cranford Mon, Delfino Lovett Other Clinician: Referring Melyna Huron: Cranford Mon, RICHARD Treating Darus Hershman/Extender: Tito Dine in Treatment: 5 Vital Signs Height(in): 63 Pulse(bpm): 81 Weight(lbs): 257 Blood Pressure(mmHg): 143/57 Body Mass Index(BMI): 46 Temperature(F): 98.5 Respiratory Rate 18 (breaths/min): Photos: [N/A:N/A] Wound Location: Left Lower Leg - Posterior N/A N/A Wounding Event: Gradually Appeared N/A N/A Primary Etiology: Diabetic Wound/Ulcer of the N/A N/A Lower Extremity Secondary Etiology: Lymphedema N/A N/A Comorbid History: Lymphedema, Hypertension, N/A N/A Peripheral Venous Disease, Type II Diabetes, End Stage Renal Disease, Osteoarthritis, Neuropathy, Received Radiation Date Acquired: 02/26/2018 N/A N/A Weeks of Treatment: 5 N/A N/A Wound Status: Open N/A N/A Measurements L x W x D 0.6x0.5x0.1 N/A N/A (cm) Area (cm) : 0.236 N/A N/A Volume (cm) : 0.024 N/A N/A % Reduction in Area: 98.40% N/A N/A % Reduction in Volume: 98.40% N/A N/A Classification: Grade 1 N/A  N/A Exudate Amount: Large N/A N/A Exudate Type: Serous N/A N/A Exudate Color: amber N/A N/A Wound Margin: Indistinct, nonvisible N/A N/A Granulation Amount: Medium (34-66%) N/A N/A Granulation Quality: Pink N/A N/A Necrotic Amount: Medium (34-66%) N/A N/A Exposed Structures: N/A N/A Castner, Careli L. ([id number]) Fat Layer (Subcutaneous Tissue) Exposed: Yes Fascia: No Tendon: No Muscle: No Joint: No Bone: No Epithelialization: Medium (34-66%) N/A N/A Periwound Skin Texture: Excoriation: No N/A N/A Induration: No Callus: No Crepitus: No Rash: No Scarring: No Periwound Skin Moisture: Dry/Scaly: Yes N/A N/A Maceration: No Periwound Skin Color: Atrophie Blanche: No N/A N/A Cyanosis: No Ecchymosis: No Erythema: No Hemosiderin Staining: No Mottled: No Pallor: No Rubor: No Temperature: No Abnormality N/A N/A Tenderness on Palpation: Yes N/A N/A Wound Preparation: Ulcer Cleansing: Other: soap N/A N/A and water Topical Anesthetic Applied: Other: lidocaine 4% Procedures Performed: Compression Therapy N/A N/A Treatment Notes Electronic Signature(s) Signed: 07/25/2018 5:18:51 PM By: Linton Ham MD Entered By: Linton Ham on 07/25/2018 09:12:23 Edick, Saffron L. ([id number]) -------------------------------------------------------------------------------- [place] Details Patient Name: Carol Ramsey, Carol Ramsey. Date of Service: 07/25/2018 8:30 AM Medical Record Number: [id number] Patient Account Number: [id number] Date of Birth/Sex: 1945/09/19 (73 y.o. F) Treating RN: Cornell Barman Primary Care Rod Majerus: Cranford Mon, Delfino Lovett Other Clinician: Referring Jalaysha Skilton: Cranford Mon, RICHARD Treating Naveah Brave/Extender: Tito Dine in Treatment: 5 Active Inactive Abuse / Safety / Falls / Self Care Management Nursing Diagnoses: Impaired physical mobility Potential for falls Goals: Patient will remain injury free related to falls Date Initiated:  06/20/2018 Target Resolution Date: 07/19/2018 Goal Status: Active Interventions: Assess fall risk on admission and as needed Notes: Orientation to the Wound Care Program Nursing Diagnoses: Knowledge deficit related to the wound healing center program Goals: Patient/caregiver will verbalize understanding of the [facility] Program Date Initiated: 06/20/2018 Target Resolution Date: 06/20/2018 Goal Status: Active Interventions: Provide education on orientation to the wound center Notes: Wound/Skin Impairment Nursing Diagnoses: Impaired tissue integrity Goals: Ulcer/skin breakdown will have a volume reduction of 30% by week 4 Date Initiated: 06/20/2018 Target Resolution Date: 07/19/2018 Goal Status: Active Interventions: Dunphy, Kingsley L. ([id number]) Assess patient/caregiver ability to obtain necessary supplies Assess patient/caregiver ability to perform ulcer/skin care regimen upon admission and as needed Assess ulceration(s) every visit Provide education on ulcer and skin care Notes: Electronic Signature(s) Signed: 07/26/2018 9:14:45 AM By: Gretta Cool, BSN, RN, CWS, Kim RN, BSN Entered By: Gretta Cool, BSN, RN, CWS, Kim on 07/25/2018 08:59:41 Manzano, Keiry L. ([id number]) -------------------------------------------------------------------------------- Pain Assessment Details Patient Name: Carol Ramsey, Carol Ramsey.  Date of Service: 07/25/2018 8:30 AM Medical Record Number: [id number] Patient Account Number: [id number] Date of Birth/Sex: 1946-01-14 (73 y.o. F) Treating RN: Cornell Barman Primary Care Liandra Mendia: Cranford Mon, Delfino Lovett Other Clinician: Referring Darris Staiger: Cranford Mon, RICHARD Treating Keshon Markovitz/Extender: Tito Dine in Treatment: 5 Active Problems Location of Pain Severity and Description of Pain Patient Has Paino No Site Locations Pain Management and Medication Current Pain Management: Electronic Signature(s) Signed: 07/25/2018 11:34:54 AM By: Paulla Fore, RRT, CHT Signed: 07/26/2018 9:14:45 AM By: Gretta Cool, BSN, RN, CWS, Kim RN, BSN Entered By: Lorine Bears on 07/25/2018 08:35:57 Gannett, Mary L. ([id number]) -------------------------------------------------------------------------------- Patient/Caregiver Education Details Patient Name: KENNEDIE, Carol Ramsey. Date of Service: 07/25/2018 8:30 AM Medical Record Number: [id number] Patient Account Number: [id number] Date of Birth/Gender: 1945-09-23 (73 y.o. F) Treating RN: Cornell Barman Primary Care Physician: Cranford Mon, Delfino Lovett Other Clinician: Referring Physician: [name] Treating Physician/Extender: Tito Dine in Treatment: 5 Education Assessment Education Provided To: Patient Education Topics Provided Venous: Handouts: Controlling Swelling with Compression Stockings , Controlling Swelling with Multilayered Compression Wraps Methods: Demonstration, Explain/Verbal Responses: State content correctly Wound/Skin Impairment: Handouts: Caring for Your Ulcer Methods: Demonstration, Explain/Verbal Responses: State content correctly Electronic Signature(s) Signed: 07/26/2018 9:14:45 AM By: Gretta Cool, BSN, RN, CWS, Kim RN, BSN Entered By: Gretta Cool, BSN, RN, CWS, Kim on 07/25/2018 09:01:52 [place], [place]. ([id number]) -------------------------------------------------------------------------------- Wound Assessment Details Patient Name: Carol Ramsey, Carol Ramsey. Date of Service: 07/25/2018 8:30 AM Medical Record Number: [id number] Patient Account Number: [id number] Date of Birth/Sex: March 10, 1946 (73 y.o. F) Treating RN: Harold Barban Primary Care Georgianne Gritz: Cranford Mon, Delfino Lovett Other Clinician: Referring Jaishawn Witzke: Cranford Mon, RICHARD Treating Khamille Beynon/Extender: Tito Dine in Treatment: 5 Wound Status Wound Number: 1 Primary Diabetic Wound/Ulcer of the Lower Extremity Etiology: Wound Location: Left Lower Leg - Posterior Secondary Lymphedema Wounding Event:  Gradually Appeared Etiology: Date Acquired: 02/26/2018 Wound Open Weeks Of Treatment: 5 Status: Clustered Wound: No Comorbid Lymphedema, Hypertension, Peripheral Venous History: Disease, Type II Diabetes, End Stage Renal Disease, Osteoarthritis, Neuropathy, Received Radiation Photos Wound Measurements Length: (cm) 0.6 % Reduction Width: (cm) 0.5 % Reduction Depth: (cm) 0.1 Epitheliali Area: (cm) 0.236 Tunneling: Volume: (cm) 0.024 in Area: 98.4% in Volume: 98.4% zation: Medium (34-66%) No Wound Description Classification: Grade 1 Foul Odor A Wound Margin: Indistinct, nonvisible Slough/Fibr Exudate Amount: Large Exudate Type: Serous Exudate Color: amber fter Cleansing: No ino Yes Wound Bed Granulation Amount: Medium (34-66%) Exposed Structure Granulation Quality: Pink Fascia Exposed: No Necrotic Amount: Medium (34-66%) Fat Layer (Subcutaneous Tissue) Exposed: Yes Necrotic Quality: Adherent Slough Tendon Exposed: No Muscle Exposed: No Joint Exposed: No Bone Exposed: No Solum, Zyona L. ([id number]) Periwound Skin Texture Texture Color No Abnormalities Noted: No No Abnormalities Noted: No Callus: No Atrophie Blanche: No Crepitus: No Cyanosis: No Excoriation: No Ecchymosis: No Induration: No Erythema: No Rash: No Hemosiderin Staining: No Scarring: No Mottled: No Pallor: No Moisture Rubor: No No Abnormalities Noted: No Dry / Scaly: Yes Temperature / Pain Maceration: No Temperature: No Abnormality Tenderness on Palpation: Yes Wound Preparation Ulcer Cleansing: Other: soap and water, Topical Anesthetic Applied: Other: lidocaine 4%, Treatment Notes Wound #1 (Left, Posterior Lower Leg) Notes Silver alginate, ABD, drawtex, 3layer Electronic Signature(s) Signed: 07/26/2018 10:08:57 AM By: Harold Barban Entered By: Harold Barban on 07/25/2018 08:48:00 Robbs, Lorenna L.  ([id number]) -------------------------------------------------------------------------------- Vitals Details Patient Name: Carol Ramsey, Carol Ramsey. Date of Service: 07/25/2018 8:30 AM Medical Record Number: [id number] Patient Account Number: [id number] Date of Birth/Sex: Aug 30, 1945 (73 y.o. F) Treating RN:  Cornell Barman Primary Care Yovany Clock: Cranford Mon, Delfino Lovett Other Clinician: Referring Alper Guilmette: Cranford Mon, RICHARD Treating Kohle Winner/Extender: Tito Dine in Treatment: 5 Vital Signs Time Taken: 08:36 Temperature (F): 98.5 Height (in): 63 Pulse (bpm): 62 Weight (lbs): 257 Respiratory Rate (breaths/min): 18 Body Mass Index (BMI): 45.5 Blood Pressure (mmHg): 143/57 Reference Range: 80 - 120 mg / dl Electronic Signature(s) Signed: 07/25/2018 11:34:54 AM By: Lorine Bears RCP, RRT, CHT Entered By: Lorine Bears on 07/25/2018 08:38:39

## 2018-07-26 NOTE — Progress Notes (Signed)
Strickler, Kiley L. ([id number]) Visit Report for 07/25/2018 HPI Details Patient Name: Carol Ramsey, Carol Ramsey. Date of Service: 07/25/2018 8:30 AM Medical Record Number: [id number] Patient Account Number: [id number] Date of Birth/Sex: 1945-12-19 (73 y.o. F) Treating RN: Cornell Barman Primary Care Provider: Cranford Mon, Delfino Lovett Other Clinician: Referring Provider: Cranford Mon, RICHARD Treating Provider/Extender: Tito Dine in Treatment: 5 History of Present Illness HPI Description: ADMISSION 06/20/2018 This is a 73 year old woman who has known problems with chronic venous insufficiency and bilateral lower extremity edema. She is also a type II diabetic with recent hemoglobin A1c of 7.2. She tells me in the last 3 to 4 months she is developed a weeping nonhealing area on the left posterior calf. There was no obvious trauma here. She thought she might of had a skin tag on the back of this and was scratching and wonder is if this actually caused the area. She is simply been putting dry gauze on the top of this. She has not been using compression stockings and does not think she would be able to get these on. She does have compression pumps and she says she uses them once a day on most days. She was seen at her primary doctor's office on April 10, 2018 at which time she was felt to have wound cellulitis. She was followed up a week later and given Lasix 20 mg. Culture of the wound grew Pseudomonas and staph and was given a combination of doxycycline transitioning to Cipro. She tells me she follows at a vein clinic in [place]. She had some form of what sounds like sclerotherapy on the medial right upper thigh just above her knee yesterday. Past medical history includes ductal carcinoma of the right breast, type 2 diabetes, venous insufficiency with bilateral lower extremity edema, sciatica, left total knee replacement, hyperthyroidism and hyperlipidemia ABIs on the right were 0.96 and noncompressible  on the left 2/26; patient we admitted to the clinic last week. She has severe bilateral lymphedema. Since she was last here she appears to have had an ablation on the right leg probably the small saphenous vein at a vein clinic in [place]. They are apparently looking to do the same on the left. Her wound areas on the posterior part of the left calf small draining open area with some surrounding skin which is erythematous macerated and looks at some risk. We put her in 3 layer compression last week. She is telling me she uses her pumps twice a day. We have some improvement in the overall edema but not as much as I was hoping 3/4; patient we admitted to the clinic 2 weeks ago. She has severe bilateral lymphedema. The wound areas on the left posterior calf. She is also going to a vein clinic in [place] and she tells me that she actually had ablations done on the left leg prior to her coming here. I knew she had the right leg done last week but I was not aware that they had previously worked on the left leg. She tells me she is going for an ultrasound tomorrow. 3/11; we have been putting 4-layer compression on the left leg. Small open wound on the left posterior calf is better this week with improved dimensions. She has had venous ablations and tells me she is going to start using her compression pumps today. She has a single external compression garment stocking that we will put on the right leg today and make sure that she can actually do this herself. If  she can then we will order one for the left leg. She was cautioned that she can use her compression pumps either over our wraps or compression stockings of any form 3/18-Patient returns to clinic with severe bilateral lymphedema, she had taken the left 4 layer wrap off on Sunday on account of pain in the upper part of the ankle, patient has had some weeping and drainage. She claims to be using the pumps as indicated. We are going to move to a 3 layer  compression to reduce the discomfort level. The wound itself appears to be doing better on the left medial aspect of left leg 3/25; patient had her 4 layer changed to 3 layer wrap last time because of discomfort. She claims to be using her pumps twice a day as indicated. We have been using silver alginate drawtex ABDs under 3 layer compression Carol Ramsey, [name] ([id number]) Electronic Signature(s) Signed: 07/25/2018 5:18:51 PM By: Linton Ham MD Entered By: Linton Ham on 07/25/2018 09:13:00 Carol Ramsey, [name] ([id number]) -------------------------------------------------------------------------------- Physical Exam Details Patient Name: [name], [name] Date of Service: 07/25/2018 8:30 AM Medical Record Number: [id number] Patient Account Number: [id number] Date of Birth/Sex: [birth year] (73 y.o. F) Treating RN: Cornell Barman Primary Care Provider: Cranford Mon, Delfino Lovett Other Clinician: Referring Provider: Cranford Mon, RICHARD Treating Provider/Extender: Tito Dine in Treatment: 5 Constitutional Patient is hypertensive.. Pulse regular and within target range for patient.[name] Respirations regular, non-labored and within target range.. Temperature is normal and within the target range for the patient.[name] appears in no distress. Cardiovascular Pedal pulses palpable. Edema present in both extremities. This is nonpitting.[name] Lymphatic None palpable in the popliteal area bilaterally. Integumentary (Hair, Skin) Lymphedema is not as controlled as I might like. There is no surrounding erythema around the wound which is an improvement. The periwound looks better. Notes Wound exam oLeft lower extremity posterior calf wound. This is small and appears healthy. The area around this is less erythematous and looks less threatened. I think this is the dependent part of where her edema tends to accumulate Electronic Signature(s) Signed: 07/25/2018 5:18:51 PM By: Linton Ham MD Entered By: Linton Ham on 07/25/2018 09:15:24 Carol Ramsey, [name] ([id number]) -------------------------------------------------------------------------------- Physician Orders Details Patient Name: Carol Ramsey, HUIE. Date of Service: 07/25/2018 8:30 AM Medical Record Number: [id number] Patient Account Number: [id number] Date of Birth/Sex: 04/20/1946 (73 y.o. F) Treating RN: Cornell Barman Primary Care Provider: Cranford Mon, Delfino Lovett Other Clinician: Referring Provider: Cranford Mon, RICHARD Treating Provider/Extender: Tito Dine in Treatment: 5 Verbal / Phone Orders: No Diagnosis Coding Wound Cleansing Wound #1 Left,Posterior Lower Leg o May shower with protection. - Cast Protector Skin Barriers/Peri-Wound Care Wound #1 Left,Posterior Lower Leg o Triamcinolone Acetonide Ointment (TCA) - peri-wound only Primary Wound Dressing Wound #1 Left,Posterior Lower Leg o Silver Alginate Secondary Dressing Wound #1 Left,Posterior Lower Leg o ABD pad - Drawtex Dressing Change Frequency Wound #1 Left,Posterior Lower Leg o Change dressing every week Follow-up Appointments Wound #1 Left,Posterior Lower Leg o Return Appointment in 1 week. o Nurse Visit as needed Edema Control Wound #1 Left,Posterior Lower Leg o 3 Layer Compression System - Left Lower Extremity - Unna to anchor o Patient to wear own compression stockings - Farrow on right leg Additional Orders / Instructions Wound #1 Left,Posterior Lower Leg o Other: - Use pumps 2 times per day, every day Electronic Signature(s) Signed: 07/25/2018 5:18:51 PM By: Linton Ham MD Signed: 07/26/2018 9:14:45 AM By: Gretta Cool, BSN, RN, CWS, Kim RN, BSN Entered By: Gretta Cool, BSN,  RN, CWS, Kim on 07/25/2018 09:01:03 Okelly, Warren ([id number]) Rusk, Melitza L. ([id number]) -------------------------------------------------------------------------------- Problem List Details Patient Name: Carol Ramsey, Carol Ramsey. Date of Service: 07/25/2018 8:30 AM Medical  Record Number: [id number] Patient Account Number: [id number] Date of Birth/Sex: [birth year] (73 y.o. F) Treating RN: Cornell Barman Primary Care Provider: Cranford Mon, Delfino Lovett Other Clinician: Referring Provider: Cranford Mon, RICHARD Treating Provider/Extender: Tito Dine in Treatment: 5 Active Problems ICD-10 Evaluated Encounter Code Description Active Date Today Diagnosis L97.221 Non-pressure chronic ulcer of left calf limited to breakdown of 07/11/2018 No Yes skin I89.0 Lymphedema, not elsewhere classified 06/20/2018 No Yes I87.312 Chronic venous hypertension (idiopathic) with ulcer of left 06/20/2018 No Yes lower extremity Inactive Problems Resolved Problems Electronic Signature(s) Signed: 07/25/2018 5:18:51 PM By: Linton Ham MD Entered By: Linton Ham on 07/25/2018 09:12:11 Buswell, [place] ([id number]) -------------------------------------------------------------------------------- Progress Note Details Patient Name: Carol Ramsey, Carol Ramsey. Date of Service: 07/25/2018 8:30 AM Medical Record Number: [id number] Patient Account Number: [id number] Date of Birth/Sex: June 29, 1945 (73 y.o. F) Treating RN: Cornell Barman Primary Care Provider: Cranford Mon, Delfino Lovett Other Clinician: Referring Provider: Cranford Mon, RICHARD Treating Provider/Extender: Tito Dine in Treatment: 5 Subjective History of Present Illness (HPI) ADMISSION 06/20/2018 This is a 73 year old woman who has known problems with chronic venous insufficiency and bilateral lower extremity edema. She is also a type II diabetic with recent hemoglobin A1c of 7.2. She tells me in the last 3 to 4 months she is developed a weeping nonhealing area on the left posterior calf. There was no obvious trauma here. She thought she might of had a skin tag on the back of this and was scratching and wonder is if this actually caused the area. She is simply been putting dry gauze on the top of this. She has not been using  compression stockings and does not think she would be able to get these on. She does have compression pumps and she says she uses them once a day on most days. She was seen at her primary doctor's office on April 10, 2018 at which time she was felt to have wound cellulitis. She was followed up a week later and given Lasix 20 mg. Culture of the wound grew Pseudomonas and staph and was given a combination of doxycycline transitioning to Cipro. She tells me she follows at a vein clinic in [place]. She had some form of what sounds like sclerotherapy on the medial right upper thigh just above her knee yesterday. Past medical history includes ductal carcinoma of the right breast, type 2 diabetes, venous insufficiency with bilateral lower extremity edema, sciatica, left total knee replacement, hyperthyroidism and hyperlipidemia ABIs on the right were 0.96 and noncompressible on the left 2/26; patient we admitted to the clinic last week. She has severe bilateral lymphedema. Since she was last here she appears to have had an ablation on the right leg probably the small saphenous vein at a vein clinic in [place]. They are apparently looking to do the same on the left. Her wound areas on the posterior part of the left calf small draining open area with some surrounding skin which is erythematous macerated and looks at some risk. We put her in 3 layer compression last week. She is telling me she uses her pumps twice a day. We have some improvement in the overall edema but not as much as I was hoping 3/4; patient we admitted to the clinic 2 weeks ago. She has severe bilateral lymphedema. The wound areas on the  left posterior calf. She is also going to a vein clinic in [place] and she tells me that she actually had ablations done on the left leg prior to her coming here. I knew she had the right leg done last week but I was not aware that they had previously worked on the left leg. She tells me she is going for  an ultrasound tomorrow. 3/11; we have been putting 4-layer compression on the left leg. Small open wound on the left posterior calf is better this week with improved dimensions. She has had venous ablations and tells me she is going to start using her compression pumps today. She has a single external compression garment stocking that we will put on the right leg today and make sure that she can actually do this herself. If she can then we will order one for the left leg. She was cautioned that she can use her compression pumps either over our wraps or compression stockings of any form 3/18-Patient returns to clinic with severe bilateral lymphedema, she had taken the left 4 layer wrap off on Sunday on account of pain in the upper part of the ankle, patient has had some weeping and drainage. She claims to be using the pumps as indicated. We are going to move to a 3 layer compression to reduce the discomfort level. The wound itself appears to be doing better on the left medial aspect of left leg 3/25; patient had her 4 layer changed to 3 layer wrap last time because of discomfort. She claims to be using her pumps twice a day as indicated. We have been using silver alginate drawtex ABDs under 3 layer compression Carol Ramsey, [name] ([id number]) Objective Constitutional Patient is hypertensive.. Pulse regular and within target range for patient.[name] Respirations regular, non-labored and within target range.. Temperature is normal and within the target range for the patient.[name] appears in no distress. Vitals Time Taken: 8:36 AM, Height: 63 in, Weight: 257 lbs, BMI: 45.5, Temperature: 98.5 F, Pulse: 62 bpm, Respiratory Rate: 18 breaths/min, Blood Pressure: 143/57 mmHg. Cardiovascular Pedal pulses palpable. Edema present in both extremities. This is nonpitting.[name] Lymphatic None palpable in the popliteal area bilaterally. General Notes: Wound exam Left lower extremity posterior calf wound. This is small and  appears healthy. The area around this is less erythematous and looks less threatened. I think this is the dependent part of where her edema tends to accumulate Integumentary (Hair, Skin) Lymphedema is not as controlled as I might like. There is no surrounding erythema around the wound which is an improvement. The periwound looks better. Wound #1 status is Open. Original cause of wound was Gradually Appeared. The wound is located on the Left,Posterior Lower Leg. The wound measures 0.6cm length x 0.5cm width x 0.1cm depth; 0.236cm^2 area and 0.024cm^3 volume. There is Fat Layer (Subcutaneous Tissue) Exposed exposed. There is no tunneling noted. There is a large amount of serous drainage noted. The wound margin is indistinct and nonvisible. There is medium (34-66%) pink granulation within the wound bed. There is a medium (34-66%) amount of necrotic tissue within the wound bed including Adherent Slough. The periwound skin appearance exhibited: Dry/Scaly. The periwound skin appearance did not exhibit: Callus, Crepitus, Excoriation, Induration, Rash, Scarring, Maceration, Atrophie Blanche, Cyanosis, Ecchymosis, Hemosiderin Staining, Mottled, Pallor, Rubor, Erythema. Periwound temperature was noted as No Abnormality. The periwound has tenderness on palpation. Assessment Active Problems ICD-10 Non-pressure chronic ulcer of left calf limited to breakdown of skin Lymphedema, not elsewhere classified Chronic venous hypertension (  idiopathic) with ulcer of left lower extremity Diagnoses ICD-10 L97.221: Non-pressure chronic ulcer of left calf limited to breakdown of skin I89.0: Lymphedema, not elsewhere classified I87.312: Chronic venous hypertension (idiopathic) with ulcer of left lower extremity Carol Ramsey, [name] ([id number]) Procedures Wound #1 Pre-procedure diagnosis of Wound #1 is a Diabetic Wound/Ulcer of the Lower Extremity located on the Left,Posterior Lower Leg . There was a Three Layer  Compression Therapy Procedure with a pre-treatment ABI of 1 by Cornell Barman, RN. Post procedure Diagnosis Wound #1: Same as Pre-Procedure Plan Wound Cleansing: Wound #1 Left,Posterior Lower Leg: May shower with protection. - Cast Protector Skin Barriers/Peri-Wound Care: Wound #1 Left,Posterior Lower Leg: Triamcinolone Acetonide Ointment (TCA) - peri-wound only Primary Wound Dressing: Wound #1 Left,Posterior Lower Leg: Silver Alginate Secondary Dressing: Wound #1 Left,Posterior Lower Leg: ABD pad - Drawtex Dressing Change Frequency: Wound #1 Left,Posterior Lower Leg: Change dressing every week Follow-up Appointments: Wound #1 Left,Posterior Lower Leg: Return Appointment in 1 week. Nurse Visit as needed Edema Control: Wound #1 Left,Posterior Lower Leg: 3 Layer Compression System - Left Lower Extremity - Unna to anchor Patient to wear own compression stockings - Farrow on right leg Additional Orders / Instructions: Wound #1 Left,Posterior Lower Leg: Other: - Use pumps 2 times per day, every day 1. Continue with silver alginate drawtex 2. Her edema control is not that good although she claims to be using her pumps. Electronic Signature(s) Signed: 07/25/2018 5:18:51 PM By: Linton Ham MD Entered By: Linton Ham on 07/25/2018 09:16:11 Carol Ramsey, Carol Ramsey ([id number]) -------------------------------------------------------------------------------- SuperBill Details Patient Name: Carol Ramsey, Carol Ramsey. Date of Service: 07/25/2018 Medical Record Number: [id number] Patient Account Number: [id number] Date of Birth/Sex: July 18, 1945 (73 y.o. F) Treating RN: Cornell Barman Primary Care Provider: Cranford Mon, Delfino Lovett Other Clinician: Referring Provider: Cranford Mon, RICHARD Treating Provider/Extender: Tito Dine in Treatment: 5 Diagnosis Coding ICD-10 Codes Code Description [phone] Non-pressure chronic ulcer of left calf limited to breakdown of skin I89.0 Lymphedema, not elsewhere  classified I87.312 Chronic venous hypertension (idiopathic) with ulcer of left lower extremity Facility Procedures CPT4 Code Description: [id number] (Facility Use Only) [phone] - APPLY MULTLAY COMPRS LWR RT LEG Modifier: Quantity: 1 Physician Procedures CPT4 Code: [id number] Description: 57972 - WC PHYS LEVEL 3 - EST PT ICD-10 Diagnosis Description L97.221 Non-pressure chronic ulcer of left calf limited to breakdown I89.0 Lymphedema, not elsewhere classified I87.312 Chronic venous hypertension (idiopathic) with ulcer of left  l Modifier: of skin ower extremity Quantity: 1 Electronic Signature(s) Signed: 07/25/2018 5:18:51 PM By: Linton Ham MD Entered By: Linton Ham on 07/25/2018 09:16:31

## 2018-07-30 ENCOUNTER — Other Ambulatory Visit: Payer: Self-pay | Admitting: Family Medicine

## 2018-07-30 DIAGNOSIS — E119 Type 2 diabetes mellitus without complications: Secondary | ICD-10-CM

## 2018-07-30 MED ORDER — GLUCOSE BLOOD VI STRP: ORAL_STRIP | 12 refills | Status: DC

## 2018-07-30 NOTE — Telephone Encounter (Signed)
Pt needing a refill on:  One Touch Verio Test Strips   Please fill at:  [id number] Phillip Heal, [place] AT [facility] OF [address] Shari Prows [phone] (Phone) [phone] (Fax)    Thanks, [employer]

## 2018-08-01 ENCOUNTER — Encounter: Payer: Medicare Other | Attending: Internal Medicine | Admitting: Internal Medicine

## 2018-08-01 ENCOUNTER — Other Ambulatory Visit: Payer: Self-pay

## 2018-08-01 DIAGNOSIS — L97222 Non-pressure chronic ulcer of left calf with fat layer exposed: Secondary | ICD-10-CM | POA: Diagnosis not present

## 2018-08-01 DIAGNOSIS — L97221 Non-pressure chronic ulcer of left calf limited to breakdown of skin: Secondary | ICD-10-CM | POA: Diagnosis not present

## 2018-08-01 DIAGNOSIS — M1990 Unspecified osteoarthritis, unspecified site: Secondary | ICD-10-CM | POA: Diagnosis not present

## 2018-08-01 DIAGNOSIS — I872 Venous insufficiency (chronic) (peripheral): Secondary | ICD-10-CM | POA: Diagnosis not present

## 2018-08-01 DIAGNOSIS — Z96652 Presence of left artificial knee joint: Secondary | ICD-10-CM | POA: Diagnosis not present

## 2018-08-01 DIAGNOSIS — E1122 Type 2 diabetes mellitus with diabetic chronic kidney disease: Secondary | ICD-10-CM | POA: Insufficient documentation

## 2018-08-01 DIAGNOSIS — E11621 Type 2 diabetes mellitus with foot ulcer: Secondary | ICD-10-CM | POA: Insufficient documentation

## 2018-08-01 DIAGNOSIS — E1140 Type 2 diabetes mellitus with diabetic neuropathy, unspecified: Secondary | ICD-10-CM | POA: Insufficient documentation

## 2018-08-01 DIAGNOSIS — N186 End stage renal disease: Secondary | ICD-10-CM | POA: Diagnosis not present

## 2018-08-01 DIAGNOSIS — I890 Lymphedema, not elsewhere classified: Secondary | ICD-10-CM | POA: Diagnosis not present

## 2018-08-01 DIAGNOSIS — Z853 Personal history of malignant neoplasm of breast: Secondary | ICD-10-CM | POA: Diagnosis not present

## 2018-08-01 DIAGNOSIS — I120 Hypertensive chronic kidney disease with stage 5 chronic kidney disease or end stage renal disease: Secondary | ICD-10-CM | POA: Insufficient documentation

## 2018-08-01 DIAGNOSIS — E11622 Type 2 diabetes mellitus with other skin ulcer: Secondary | ICD-10-CM | POA: Diagnosis not present

## 2018-08-02 NOTE — Progress Notes (Signed)
Chrostowski, Jazalyn L. ([id number]) Visit Report for 08/01/2018 Debridement Details Patient Name: Carol Ramsey, Carol Ramsey. Date of Service: 08/01/2018 10:45 AM Medical Record Number: [id number] Patient Account Number: [id number] Date of Birth/Sex: 05/26/1945 (73 y.o. F) Treating RN: Cornell Barman Primary Care Provider: Cranford Mon, Delfino Lovett Other Clinician: Referring Provider: Cranford Mon, RICHARD Treating Provider/Extender: Tito Dine in Treatment: 6 Debridement Performed for Wound #1 Left,Posterior Lower Leg Assessment: Performed By: Physician Ricard Dillon, MD Debridement Type: Debridement Severity of Tissue Pre Fat layer exposed Debridement: Level of Consciousness (Pre- Awake and Alert procedure): Pre-procedure Verification/Time Yes - 11:28 Out Taken: Start Time: 11:28 Pain Control: Lidocaine Total Area Debrided (L x W): 0.6 (cm) x 0.5 (cm) = 0.3 (cm) Tissue and other material Viable, Subcutaneous debrided: Level: Skin/Subcutaneous Tissue Debridement Description: Excisional Instrument: Curette Bleeding: Minimum Hemostasis Achieved: Silver Nitrate End Time: 11:32 Procedural Pain: 0 Post Procedural Pain: 0 Response to Treatment: Procedure was tolerated well Level of Consciousness Awake and Alert (Post-procedure): Post Debridement Measurements of Total Wound Length: (cm) 0.6 Width: (cm) 0.5 Depth: (cm) 0.1 Volume: (cm) 0.024 Character of Wound/Ulcer Post Debridement: Stable Severity of Tissue Post Debridement: Fat layer exposed Post Procedure Diagnosis Same as Pre-procedure Electronic Signature(s) Signed: 08/01/2018 4:06:01 PM By: Linton Ham MD Signed: 08/02/2018 4:09:12 PM By: Gretta Cool, BSN, RN, CWS, Kim RN, BSN Entered By: Linton Ham on 08/01/2018 11:34:26 Tobey, Irean L. ([id number]) Fenner, Pricilla L. ([id number]) -------------------------------------------------------------------------------- HPI Details Patient Name: Carol Ramsey, Carol Ramsey. Date of Service:  08/01/2018 10:45 AM Medical Record Number: [id number] Patient Account Number: [id number] Date of Birth/Sex: Feb 21, 1946 (73 y.o. F) Treating RN: Cornell Barman Primary Care Provider: Cranford Mon, Delfino Lovett Other Clinician: Referring Provider: Cranford Mon, RICHARD Treating Provider/Extender: Tito Dine in Treatment: 6 History of Present Illness HPI Description: ADMISSION 06/20/2018 This is a 73 year old woman who has known problems with chronic venous insufficiency and bilateral lower extremity edema. She is also a type II diabetic with recent hemoglobin A1c of 7.2. She tells me in the last 3 to 4 months she is developed a weeping nonhealing area on the left posterior calf. There was no obvious trauma here. She thought she might of had a skin tag on the back of this and was scratching and wonder is if this actually caused the area. She is simply been putting dry gauze on the top of this. She has not been using compression stockings and does not think she would be able to get these on. She does have compression pumps and she says she uses them once a day on most days. She was seen at her primary doctor's office on April 10, 2018 at which time she was felt to have wound cellulitis. She was followed up a week later and given Lasix 20 mg. Culture of the wound grew Pseudomonas and staph and was given a combination of doxycycline transitioning to Cipro. She tells me she follows at a vein clinic in [place]. She had some form of what sounds like sclerotherapy on the medial right upper thigh just above her knee yesterday. Past medical history includes ductal carcinoma of the right breast, type 2 diabetes, venous insufficiency with bilateral lower extremity edema, sciatica, left total knee replacement, hyperthyroidism and hyperlipidemia ABIs on the right were 0.96 and noncompressible on the left 2/26; patient we admitted to the clinic last week. She has severe bilateral lymphedema. Since she was last  here she appears to have had an ablation on the right leg probably the small saphenous vein at  a vein clinic in [place]. They are apparently looking to do the same on the left. Her wound areas on the posterior part of the left calf small draining open area with some surrounding skin which is erythematous macerated and looks at some risk. We put her in 3 layer compression last week. She is telling me she uses her pumps twice a day. We have some improvement in the overall edema but not as much as I was hoping 3/4; patient we admitted to the clinic 2 weeks ago. She has severe bilateral lymphedema. The wound areas on the left posterior calf. She is also going to a vein clinic in [place] and she tells me that she actually had ablations done on the left leg prior to her coming here. I knew she had the right leg done last week but I was not aware that they had previously worked on the left leg. She tells me she is going for an ultrasound tomorrow. 3/11; we have been putting 4-layer compression on the left leg. Small open wound on the left posterior calf is better this week with improved dimensions. She has had venous ablations and tells me she is going to start using her compression pumps today. She has a single external compression garment stocking that we will put on the right leg today and make sure that she can actually do this herself. If she can then we will order one for the left leg. She was cautioned that she can use her compression pumps either over our wraps or compression stockings of any form 3/18-Patient returns to clinic with severe bilateral lymphedema, she had taken the left 4 layer wrap off on Sunday on account of pain in the upper part of the ankle, patient has had some weeping and drainage. She claims to be using the pumps as indicated. We are going to move to a 3 layer compression to reduce the discomfort level. The wound itself appears to be doing better on the left medial aspect of  left leg 3/25; patient had her 4 layer changed to 3 layer wrap last time because of discomfort. She claims to be using her pumps twice a day as indicated. We have been using silver alginate drawtex ABDs under 3 layer compression 4/1; we still do not have good edema control although she is likely sitting and using her pumps with her leg dependent. She has 2 open areas. The area itself looks somewhat better. We have been using silver alginate, changed her to [employer] this week Electronic Signature(s) Craghead, Ninoshka L. ([id number]) Signed: 08/01/2018 4:06:01 PM By: Linton Ham MD Entered By: Linton Ham on 08/01/2018 11:35:11 Kuenzi, [place] ([id number]) -------------------------------------------------------------------------------- Physical Exam Details Patient Name: Carol Ramsey, [name] Date of Service: 08/01/2018 10:45 AM Medical Record Number: [id number] Patient Account Number: [id number] Date of Birth/Sex: [birth year] (73 y.o. F) Treating RN: Cornell Barman Primary Care Provider: Cranford Mon, Delfino Lovett Other Clinician: Referring Provider: Cranford Mon, RICHARD Treating Provider/Extender: Tito Dine in Treatment: 6 Constitutional Patient is hypertensive.. Pulse regular and within target range for patient.[name] Respirations regular, non-labored and within target range.. Temperature is normal and within the target range for the patient.[name] appears in no distress. Notes Wound exam; left posterior calf wound. We do not have good edema control in the left leg. 2 small open areas remain. Both of them covered and adherent adherent necrotic debris which I removed with a #3 curette. Hemostasis with direct pressure Electronic Signature(s) Signed: 08/01/2018 4:06:01 PM By: Linton Ham  MD Entered By: Linton Ham on 08/01/2018 11:38:24 Altieri, Nature L. ([id number]) -------------------------------------------------------------------------------- Physician Orders Details Patient Name:  SALLYANN, KINNAIRD. Date of Service: 08/01/2018 10:45 AM Medical Record Number: [id number] Patient Account Number: [id number] Date of Birth/Sex: [birth year] (73 y.o. F) Treating RN: Cornell Barman Primary Care Provider: Cranford Mon, Delfino Lovett Other Clinician: Referring Provider: Cranford Mon, RICHARD Treating Provider/Extender: Tito Dine in Treatment: 6 Verbal / Phone Orders: No Diagnosis Coding Wound Cleansing Wound #1 Left,Posterior Lower Leg o May shower with protection. - Cast Protector Anesthetic (add to Medication List) Wound #1 Left,Posterior Lower Leg o Topical Lidocaine 4% cream applied to wound bed prior to debridement (In Clinic Only). Skin Barriers/Peri-Wound Care Wound #1 Left,Posterior Lower Leg o Triamcinolone Acetonide Ointment (TCA) - peri-wound only Primary Wound Dressing Wound #1 Left,Posterior Lower Leg o Hydrafera Blue Ready Transfer Secondary Dressing Wound #1 Left,Posterior Lower Leg o ABD pad - Drawtex Dressing Change Frequency Wound #1 Left,Posterior Lower Leg o Change dressing every week Follow-up Appointments Wound #1 Left,Posterior Lower Leg o Return Appointment in 1 week. o Nurse Visit as needed Edema Control Wound #1 Left,Posterior Lower Leg o 3 Layer Compression System - Left Lower Extremity - Unna to anchor o Patient to wear own compression stockings - Farrow on right leg o Elevate legs to the level of the heart and pump ankles as often as possible Additional Orders / Instructions Wound #1 Left,Posterior Lower Leg o Other: - Use pumps 2 times per day, every day Moragne, Caisley L. ([id number]) Electronic Signature(s) Signed: 08/01/2018 4:06:01 PM By: Linton Ham MD Signed: 08/02/2018 4:09:12 PM By: Gretta Cool, BSN, RN, CWS, Kim RN, BSN Entered By: Gretta Cool, BSN, RN, CWS, Kim on 08/01/2018 11:31:57 Vest, Sylvia L. ([id number]) -------------------------------------------------------------------------------- Problem List  Details Patient Name: Carol Ramsey, Carol Ramsey. Date of Service: 08/01/2018 10:45 AM Medical Record Number: [id number] Patient Account Number: [id number] Date of Birth/Sex: 1946-04-11 (73 y.o. F) Treating RN: Cornell Barman Primary Care Provider: Cranford Mon, Delfino Lovett Other Clinician: Referring Provider: Cranford Mon, RICHARD Treating Provider/Extender: Tito Dine in Treatment: 6 Active Problems ICD-10 Evaluated Encounter Code Description Active Date Today Diagnosis L97.221 Non-pressure chronic ulcer of left calf limited to breakdown of 07/11/2018 No Yes skin I89.0 Lymphedema, not elsewhere classified 06/20/2018 No Yes I87.312 Chronic venous hypertension (idiopathic) with ulcer of left 06/20/2018 No Yes lower extremity Inactive Problems Resolved Problems Electronic Signature(s) Signed: 08/01/2018 4:06:01 PM By: Linton Ham MD Entered By: Linton Ham on 08/01/2018 11:33:55 Mechling, Murrel L. ([id number]) -------------------------------------------------------------------------------- Progress Note Details Patient Name: Carol Ramsey, Carol Ramsey. Date of Service: 08/01/2018 10:45 AM Medical Record Number: [id number] Patient Account Number: [id number] Date of Birth/Sex: [birth year] (72 y.o. F) Treating RN: Cornell Barman Primary Care Provider: Cranford Mon, Delfino Lovett Other Clinician: Referring Provider: Cranford Mon, RICHARD Treating Provider/Extender: Tito Dine in Treatment: 6 Subjective History of Present Illness (HPI) ADMISSION 06/20/2018 This is a 73 year old woman who has known problems with chronic venous insufficiency and bilateral lower extremity edema. She is also a type II diabetic with recent hemoglobin A1c of 7.2. She tells me in the last 3 to 4 months she is developed a weeping nonhealing area on the left posterior calf. There was no obvious trauma here. She thought she might of had a skin tag on the back of this and was scratching and wonder is if this actually caused the area. She  is simply been putting dry gauze on the top of this. She has not been using compression stockings and does not think she  would be able to get these on. She does have compression pumps and she says she uses them once a day on most days. She was seen at her primary doctor's office on April 10, 2018 at which time she was felt to have wound cellulitis. She was followed up a week later and given Lasix 20 mg. Culture of the wound grew Pseudomonas and staph and was given a combination of doxycycline transitioning to Cipro. She tells me she follows at a vein clinic in [place]. She had some form of what sounds like sclerotherapy on the medial right upper thigh just above her knee yesterday. Past medical history includes ductal carcinoma of the right breast, type 2 diabetes, venous insufficiency with bilateral lower extremity edema, sciatica, left total knee replacement, hyperthyroidism and hyperlipidemia ABIs on the right were 0.96 and noncompressible on the left 2/26; patient we admitted to the clinic last week. She has severe bilateral lymphedema. Since she was last here she appears to have had an ablation on the right leg probably the small saphenous vein at a vein clinic in [place]. They are apparently looking to do the same on the left. Her wound areas on the posterior part of the left calf small draining open area with some surrounding skin which is erythematous macerated and looks at some risk. We put her in 3 layer compression last week. She is telling me she uses her pumps twice a day. We have some improvement in the overall edema but not as much as I was hoping 3/4; patient we admitted to the clinic 2 weeks ago. She has severe bilateral lymphedema. The wound areas on the left posterior calf. She is also going to a vein clinic in [place] and she tells me that she actually had ablations done on the left leg prior to her coming here. I knew she had the right leg done last week but I was not aware  that they had previously worked on the left leg. She tells me she is going for an ultrasound tomorrow. 3/11; we have been putting 4-layer compression on the left leg. Small open wound on the left posterior calf is better this week with improved dimensions. She has had venous ablations and tells me she is going to start using her compression pumps today. She has a single external compression garment stocking that we will put on the right leg today and make sure that she can actually do this herself. If she can then we will order one for the left leg. She was cautioned that she can use her compression pumps either over our wraps or compression stockings of any form 3/18-Patient returns to clinic with severe bilateral lymphedema, she had taken the left 4 layer wrap off on Sunday on account of pain in the upper part of the ankle, patient has had some weeping and drainage. She claims to be using the pumps as indicated. We are going to move to a 3 layer compression to reduce the discomfort level. The wound itself appears to be doing better on the left medial aspect of left leg 3/25; patient had her 4 layer changed to 3 layer wrap last time because of discomfort. She claims to be using her pumps twice a day as indicated. We have been using silver alginate drawtex ABDs under 3 layer compression 4/1; we still do not have good edema control although she is likely sitting and using her pumps with her leg dependent. She has 2 open areas. The area itself looks  somewhat better. We have been using silver alginate, changed her to [employer] this week Papania, Jaylei L. ([id number]) Objective Constitutional Patient is hypertensive.. Pulse regular and within target range for patient.[name] Respirations regular, non-labored and within target range.. Temperature is normal and within the target range for the patient.[name] appears in no distress. Vitals Time Taken: 11:06 AM, Height: 63 in, Weight: 257 lbs, BMI: 45.5,  Temperature: 98.0 F, Pulse: 51 bpm, Respiratory Rate: 18 breaths/min, Blood Pressure: 177/64 mmHg. General Notes: Wound exam; left posterior calf wound. We do not have good edema control in the left leg. 2 small open areas remain. Both of them covered and adherent adherent necrotic debris which I removed with a #3 curette. Hemostasis with direct pressure Integumentary (Hair, Skin) Wound #1 status is Open. Original cause of wound was Gradually Appeared. The wound is located on the Left,Posterior Lower Leg. The wound measures 0.6cm length x 0.5cm width x 0.1cm depth; 0.236cm^2 area and 0.024cm^3 volume. There is Fat Layer (Subcutaneous Tissue) Exposed exposed. There is no tunneling or undermining noted. There is a large amount of serous drainage noted. The wound margin is indistinct and nonvisible. There is medium (34-66%) pink granulation within the wound bed. There is a medium (34-66%) amount of necrotic tissue within the wound bed including Adherent Slough. The periwound skin appearance exhibited: Dry/Scaly. The periwound skin appearance did not exhibit: Callus, Crepitus, Excoriation, Induration, Rash, Scarring, Maceration, Atrophie Blanche, Cyanosis, Ecchymosis, Hemosiderin Staining, Mottled, Pallor, Rubor, Erythema. Periwound temperature was noted as No Abnormality. The periwound has tenderness on palpation. Assessment Active Problems ICD-10 Non-pressure chronic ulcer of left calf limited to breakdown of skin Lymphedema, not elsewhere classified Chronic venous hypertension (idiopathic) with ulcer of left lower extremity Procedures Wound #1 Pre-procedure diagnosis of Wound #1 is a Diabetic Wound/Ulcer of the Lower Extremity located on the Left,Posterior Lower Leg .Severity of Tissue Pre Debridement is: Fat layer exposed. There was a Excisional Skin/Subcutaneous Tissue Debridement with a total area of 0.3 sq cm performed by Ricard Dillon, MD. With the following instrument(s): Curette  to remove Viable tissue/material. Material removed includes Subcutaneous Tissue after achieving pain control using Lidocaine. No specimens were taken. A time out was conducted at 11:28, prior to the start of the procedure. A Minimum amount of bleeding was controlled with Silver Nitrate. The procedure was tolerated well with a pain level of 0 throughout and a pain level of 0 following the procedure. Post Debridement Measurements: 0.6cm length x 0.5cm width x 0.1cm depth; Wakeman, Ashleen L. ([id number]) 0.024cm^3 volume. Character of Wound/Ulcer Post Debridement is stable. Severity of Tissue Post Debridement is: Fat layer exposed. Post procedure Diagnosis Wound #1: Same as Pre-Procedure Plan Wound Cleansing: Wound #1 Left,Posterior Lower Leg: May shower with protection. - Cast Protector Anesthetic (add to Medication List): Wound #1 Left,Posterior Lower Leg: Topical Lidocaine 4% cream applied to wound bed prior to debridement (In Clinic Only). Skin Barriers/Peri-Wound Care: Wound #1 Left,Posterior Lower Leg: Triamcinolone Acetonide Ointment (TCA) - peri-wound only Primary Wound Dressing: Wound #1 Left,Posterior Lower Leg: Hydrafera Blue Ready Transfer Secondary Dressing: Wound #1 Left,Posterior Lower Leg: ABD pad - Drawtex Dressing Change Frequency: Wound #1 Left,Posterior Lower Leg: Change dressing every week Follow-up Appointments: Wound #1 Left,Posterior Lower Leg: Return Appointment in 1 week. Nurse Visit as needed Edema Control: Wound #1 Left,Posterior Lower Leg: 3 Layer Compression System - Left Lower Extremity - Unna to anchor Patient to wear own compression stockings - Farrow on right leg Elevate legs to the level of the  heart and pump ankles as often as possible Additional Orders / Instructions: Wound #1 Left,Posterior Lower Leg: Other: - Use pumps 2 times per day, every day 1. I have changed from silver alginate to Hydrofera Blue 2. I continue to think this is the  dependent area with her edema. 3 we have asked her to keep her legs elevated when sitting and when using her compression pumps. 3. She claims compliance with twice a day compression pump usage 4. She did not tolerate 4-layer wraps Electronic Signature(s) Signed: 08/01/2018 4:06:01 PM By: Linton Ham MD Entered By: Linton Ham on 08/01/2018 11:40:02 Suhre, [place] ([id number]) -------------------------------------------------------------------------------- SuperBill Details Patient Name: ANNITTA, FIFIELD. Date of Service: 08/01/2018 Medical Record Number: [id number] Patient Account Number: [id number] Date of Birth/Sex: 09/22/1945 (73 y.o. F) Treating RN: Cornell Barman Primary Care Provider: Cranford Mon, Delfino Lovett Other Clinician: Referring Provider: Cranford Mon, RICHARD Treating Provider/Extender: Tito Dine in Treatment: 6 Diagnosis Coding ICD-10 Codes Code Description [phone] Non-pressure chronic ulcer of left calf limited to breakdown of skin I89.0 Lymphedema, not elsewhere classified I87.312 Chronic venous hypertension (idiopathic) with ulcer of left lower extremity Facility Procedures CPT4 Code: [id number] Description: [employer] - DEB SUBQ TISSUE 20 SQ CM/< ICD-10 Diagnosis Description L97.221 Non-pressure chronic ulcer of left calf limited to breakdown Modifier: of skin Quantity: 1 Physician Procedures CPT4 Code: [id number] Description: 52080 - WC PHYS SUBQ TISS 20 SQ CM ICD-10 Diagnosis Description L97.221 Non-pressure chronic ulcer of left calf limited to breakdown Modifier: of skin Quantity: 1 Electronic Signature(s) Signed: 08/01/2018 4:06:01 PM By: Linton Ham MD Entered By: Linton Ham on 08/01/2018 11:40:29

## 2018-08-03 NOTE — Progress Notes (Signed)
Pantaleon, Janeann L. ([id number]) Visit Report for 08/01/2018 Arrival Information Details Patient Name: Carol Ramsey, Carol Ramsey. Date of Service: 08/01/2018 10:45 AM Medical Record Number: [id number] Patient Account Number: [id number] Date of Birth/Sex: [birth year] (73 y.o. F) Treating RN: Harold Barban Primary Care Seana Underwood: Cranford Mon, Delfino Lovett Other Clinician: Referring Beonka Amesquita: Cranford Mon, RICHARD Treating Annalise Mcdiarmid/Extender: Tito Dine in Treatment: 6 Visit Information History Since Last Visit Added or deleted any medications: No Patient Arrived: Cane Any new allergies or adverse reactions: No Arrival Time: 11:05 Had a fall or experienced change in No Accompanied By: self activities of daily living that may affect Transfer Assistance: None risk of falls: Patient Identification Verified: Yes Signs or symptoms of abuse/neglect since last visito No Secondary Verification Process Completed: Yes Hospitalized since last visit: No Patient Has Alerts: Yes Has Dressing in Place as Prescribed: Yes Patient Alerts: DMII Has Compression in Place as Prescribed: Yes Pain Present Now: No Electronic Signature(s) Signed: 08/03/2018 8:16:30 AM By: Harold Barban Entered By: Harold Barban on 08/01/2018 11:05:38 Coyle, Mayerly L. ([id number]) -------------------------------------------------------------------------------- Encounter Discharge Information Details Patient Name: Carol Ramsey, Carol Ramsey. Date of Service: 08/01/2018 10:45 AM Medical Record Number: [id number] Patient Account Number: [id number] Date of Birth/Sex: 08/28/1945 (73 y.o. F) Treating RN: Army Melia Primary Care Goldie Tregoning: [name] Other Clinician: Referring Livianna Petraglia: Cranford Mon, RICHARD Treating Janira Mandell/Extender: Tito Dine in Treatment: 6 Encounter Discharge Information Items Post Procedure Vitals Discharge Condition: Stable Temperature (F): 98.0 Ambulatory Status: Cane Pulse (bpm): 51 Discharge  Destination: Home Respiratory Rate (breaths/min): 16 Transportation: Private Auto Blood Pressure (mmHg): 177/64 Accompanied By: self Schedule Follow-up Appointment: Yes Clinical Summary of Care: Electronic Signature(s) Signed: 08/01/2018 3:51:25 PM By: Army Melia Entered By: Army Melia on 08/01/2018 [place], [place] ([id number]) -------------------------------------------------------------------------------- Lower Extremity Assessment Details Patient Name: Carol Ramsey, Carol Ramsey. Date of Service: 08/01/2018 10:45 AM Medical Record Number: [id number] Patient Account Number: [id number] Date of Birth/Sex: 08/26/1945 (73 y.o. F) Treating RN: Harold Barban Primary Care Enzio Buchler: Cranford Mon, Delfino Lovett Other Clinician: Referring Lotus Gover: Cranford Mon, RICHARD Treating Porscha Axley/Extender: Tito Dine in Treatment: 6 Edema Assessment Assessed: [Left: No] [Right: No] [Left: Edema] [Right: :] Calf Left: Right: Point of Measurement: 34 cm From Medial Instep 52.5 cm cm Ankle Left: Right: Point of Measurement: 10 cm From Medial Instep 34 cm cm Vascular Assessment Pulses: Dorsalis Pedis Palpable: [Left:Yes] Posterior Tibial Palpable: [Left:Yes] Extremity colors, hair growth, and conditions: Extremity Color: [Left:Red] Hair Growth on Extremity: [Left:No] Temperature of Extremity: [Left:Cool < 3 seconds] Toe Nail Assessment Left: Right: Thick: No Discolored: No Deformed: No Improper Length and Hygiene: No Electronic Signature(s) Signed: 08/03/2018 8:16:30 AM By: Harold Barban Entered By: Harold Barban on 08/01/2018 11:20:57 Hooton, Jalasia L. ([id number]) -------------------------------------------------------------------------------- Multi Wound Chart Details Patient Name: Carol Ramsey, Carol Ramsey. Date of Service: 08/01/2018 10:45 AM Medical Record Number: [id number] Patient Account Number: [id number] Date of Birth/Sex: 1945/11/28 (73 y.o. F) Treating RN: Cornell Barman Primary Care  Darleny Sem: Cranford Mon, Delfino Lovett Other Clinician: Referring Tanica Gaige: Cranford Mon, RICHARD Treating Keyen Marban/Extender: Tito Dine in Treatment: 6 Vital Signs Height(in): 63 Pulse(bpm): 51 Weight(lbs): 257 Blood Pressure(mmHg): 177/64 Body Mass Index(BMI): 46 Temperature(F): 98.0 Respiratory Rate 18 (breaths/min): Photos: [N/A:N/A] Wound Location: Left Lower Leg - Posterior N/A N/A Wounding Event: Gradually Appeared N/A N/A Primary Etiology: Diabetic Wound/Ulcer of the N/A N/A Lower Extremity Secondary Etiology: Lymphedema N/A N/A Comorbid History: Lymphedema, Hypertension, N/A N/A Peripheral Venous Disease, Type II Diabetes, End Stage Renal Disease, Osteoarthritis, Neuropathy, Received Radiation Date Acquired: 02/26/2018  N/A N/A Weeks of Treatment: 6 N/A N/A Wound Status: Open N/A N/A Measurements L x W x D 0.6x0.5x0.1 N/A N/A (cm) Area (cm) : 0.236 N/A N/A Volume (cm) : 0.024 N/A N/A % Reduction in Area: 98.40% N/A N/A % Reduction in Volume: 98.40% N/A N/A Classification: Grade 1 N/A N/A Exudate Amount: Large N/A N/A Exudate Type: Serous N/A N/A Exudate Color: amber N/A N/A Wound Margin: Indistinct, nonvisible N/A N/A Granulation Amount: Medium (34-66%) N/A N/A Granulation Quality: Pink N/A N/A Necrotic Amount: Medium (34-66%) N/A N/A Exposed Structures: N/A N/A Balsley, Mirissa L. ([id number]) Fat Layer (Subcutaneous Tissue) Exposed: Yes Fascia: No Tendon: No Muscle: No Joint: No Bone: No Epithelialization: Medium (34-66%) N/A N/A Debridement: Debridement - Excisional N/A N/A Pre-procedure 11:28 N/A N/A Verification/Time Out Taken: Pain Control: Lidocaine N/A N/A Tissue Debrided: Subcutaneous N/A N/A Level: Skin/Subcutaneous Tissue N/A N/A Debridement Area (sq cm): 0.3 N/A N/A Instrument: Curette N/A N/A Bleeding: Minimum N/A N/A Hemostasis Achieved: Silver Nitrate N/A N/A Procedural Pain: 0 N/A N/A Post Procedural Pain: 0 N/A  N/A Debridement Treatment Procedure was tolerated well N/A N/A Response: Post Debridement 0.6x0.5x0.1 N/A N/A Measurements L x W x D (cm) Post Debridement Volume: 0.024 N/A N/A (cm) Periwound Skin Texture: Excoriation: No N/A N/A Induration: No Callus: No Crepitus: No Rash: No Scarring: No Periwound Skin Moisture: Dry/Scaly: Yes N/A N/A Maceration: No Periwound Skin Color: Atrophie Blanche: No N/A N/A Cyanosis: No Ecchymosis: No Erythema: No Hemosiderin Staining: No Mottled: No Pallor: No Rubor: No Temperature: No Abnormality N/A N/A Tenderness on Palpation: Yes N/A N/A Wound Preparation: Ulcer Cleansing: Other: soap N/A N/A and water Topical Anesthetic Applied: Other: lidocaine 4% Procedures Performed: Debridement N/A N/A Treatment Notes Electronic Signature(s) Signed: 08/01/2018 4:06:01 PM By: Linton Ham MD Albor, Tahtiana L. ([id number]) Entered By: Linton Ham on 08/01/2018 11:34:10 Sundt, Guiliana L. ([id number]) -------------------------------------------------------------------------------- [place] Details Patient Name: Carol Ramsey, Carol Ramsey. Date of Service: 08/01/2018 10:45 AM Medical Record Number: [id number] Patient Account Number: [id number] Date of Birth/Sex: 07/23/1945 (73 y.o. F) Treating RN: Cornell Barman Primary Care Deneane Stifter: Cranford Mon, Delfino Lovett Other Clinician: Referring Anne Boltz: Cranford Mon, RICHARD Treating Chioma Mukherjee/Extender: Tito Dine in Treatment: 6 Active Inactive Abuse / Safety / Falls / Self Care Management Nursing Diagnoses: Impaired physical mobility Potential for falls Goals: Patient will remain injury free related to falls Date Initiated: 06/20/2018 Target Resolution Date: 07/19/2018 Goal Status: Active Interventions: Assess fall risk on admission and as needed Notes: Orientation to the Wound Care Program Nursing Diagnoses: Knowledge deficit related to the wound healing center  program Goals: Patient/caregiver will verbalize understanding of the [facility] Date Initiated: 06/20/2018 Target Resolution Date: 06/20/2018 Goal Status: Active Interventions: Provide education on orientation to the wound center Notes: Wound/Skin Impairment Nursing Diagnoses: Impaired tissue integrity Goals: Ulcer/skin breakdown will have a volume reduction of 30% by week 4 Date Initiated: 06/20/2018 Target Resolution Date: 07/19/2018 Goal Status: Active Interventions: Whaling, Domini L. ([id number]) Assess patient/caregiver ability to obtain necessary supplies Assess patient/caregiver ability to perform ulcer/skin care regimen upon admission and as needed Assess ulceration(s) every visit Provide education on ulcer and skin care Notes: Electronic Signature(s) Signed: 08/02/2018 4:09:12 PM By: Gretta Cool, BSN, RN, CWS, Kim RN, BSN Entered By: Gretta Cool, BSN, RN, CWS, Kim on 08/01/2018 11:29:03 Amore, Maudy L. ([id number]) -------------------------------------------------------------------------------- Pain Assessment Details Patient Name: Carol Ramsey, Carol Ramsey. Date of Service: 08/01/2018 10:45 AM Medical Record Number: [id number] Patient Account Number: [id number] Date of Birth/Sex: 05/23/1945 (73 y.o. F) Treating RN: Oretha Ellis,  Carmell Austria Primary Care Shraddha Lebron: Cranford Mon, Delfino Lovett Other Clinician: Referring Hadlyn Amero: Cranford Mon, RICHARD Treating Ramces Shomaker/Extender: Tito Dine in Treatment: 6 Active Problems Location of Pain Severity and Description of Pain Patient Has Paino No Site Locations Pain Management and Medication Current Pain Management: Electronic Signature(s) Signed: 08/03/2018 8:16:30 AM By: Harold Barban Entered By: Harold Barban on 08/01/2018 11:06:14 Jepsen, [place] ([id number]) -------------------------------------------------------------------------------- Patient/Caregiver Education Details Patient Name: Carol Ramsey, Carol Ramsey. Date of Service: 08/01/2018  10:45 AM Medical Record Number: [id number] Patient Account Number: [id number] Date of Birth/Gender: May 16, 1945 (73 y.o. F) Treating RN: Cornell Barman Primary Care Physician: Cranford Mon, Delfino Lovett Other Clinician: Referring Physician: [name] Treating Physician/Extender: Tito Dine in Treatment: 6 Education Assessment Education Provided To: Patient Education Topics Provided Wound/Skin Impairment: Handouts: Caring for Your Ulcer Methods: Demonstration, Explain/Verbal Responses: State content correctly Electronic Signature(s) Signed: 08/02/2018 4:09:12 PM By: Gretta Cool, BSN, RN, CWS, Kim RN, BSN Entered By: Gretta Cool, BSN, RN, CWS, Kim on 08/01/2018 11:32:51 Spidle, Kippy L. ([id number]) -------------------------------------------------------------------------------- Wound Assessment Details Patient Name: Carol Ramsey, Carol Ramsey. Date of Service: 08/01/2018 10:45 AM Medical Record Number: [id number] Patient Account Number: [id number] Date of Birth/Sex: 11-19-1945 (73 y.o. F) Treating RN: Harold Barban Primary Care Deliliah Spranger: Cranford Mon, Delfino Lovett Other Clinician: Referring Yoceline Bazar: Cranford Mon, RICHARD Treating Dermot Gremillion/Extender: Tito Dine in Treatment: 6 Wound Status Wound Number: 1 Primary Diabetic Wound/Ulcer of the Lower Extremity Etiology: Wound Location: Left Lower Leg - Posterior Secondary Lymphedema Wounding Event: Gradually Appeared Etiology: Date Acquired: 02/26/2018 Wound Open Weeks Of Treatment: 6 Status: Clustered Wound: No Comorbid Lymphedema, Hypertension, Peripheral Venous History: Disease, Type II Diabetes, End Stage Renal Disease, Osteoarthritis, Neuropathy, Received Radiation Photos Wound Measurements Length: (cm) 0.6 % Reduction Width: (cm) 0.5 % Reduction Depth: (cm) 0.1 Epitheliali Area: (cm) 0.236 Tunneling: Volume: (cm) 0.024 Underminin in Area: 98.4% in Volume: 98.4% zation: Medium (34-66%) No g: No Wound  Description Classification: Grade 1 Foul Odor A Wound Margin: Indistinct, nonvisible Slough/Fibr Exudate Amount: Large Exudate Type: Serous Exudate Color: amber fter Cleansing: No ino Yes Wound Bed Granulation Amount: Medium (34-66%) Exposed Structure Granulation Quality: Pink Fascia Exposed: No Necrotic Amount: Medium (34-66%) Fat Layer (Subcutaneous Tissue) Exposed: Yes Necrotic Quality: Adherent Slough Tendon Exposed: No Muscle Exposed: No Joint Exposed: No Bone Exposed: No Schneiderman, Amirah L. ([id number]) Periwound Skin Texture Texture Color No Abnormalities Noted: No No Abnormalities Noted: No Callus: No Atrophie Blanche: No Crepitus: No Cyanosis: No Excoriation: No Ecchymosis: No Induration: No Erythema: No Rash: No Hemosiderin Staining: No Scarring: No Mottled: No Pallor: No Moisture Rubor: No No Abnormalities Noted: No Dry / Scaly: Yes Temperature / Pain Maceration: No Temperature: No Abnormality Tenderness on Palpation: Yes Wound Preparation Ulcer Cleansing: Other: soap and water, Topical Anesthetic Applied: Other: lidocaine 4%, Treatment Notes Wound #1 (Left, Posterior Lower Leg) Notes hydrofera blue, ABD, drawtex, 3layer Electronic Signature(s) Signed: 08/03/2018 8:16:30 AM By: Harold Barban Entered By: Harold Barban on 08/01/2018 11:18:03 Leeb, Analeigha L. ([id number]) -------------------------------------------------------------------------------- Vitals Details Patient Name: Carol Ramsey, Carol Ramsey. Date of Service: 08/01/2018 10:45 AM Medical Record Number: [id number] Patient Account Number: [id number] Date of Birth/Sex: Apr 25, 1946 (73 y.o. F) Treating RN: Harold Barban Primary Care Lori Popowski: Cranford Mon, Delfino Lovett Other Clinician: Referring Deklen Popelka: Cranford Mon, RICHARD Treating Damaris Geers/Extender: Tito Dine in Treatment: 6 Vital Signs Time Taken: 11:06 Temperature (F): 98.0 Height (in): 63 Pulse (bpm): 51 Weight (lbs):  257 Respiratory Rate (breaths/min): 18 Body Mass Index (BMI): 45.5 Blood Pressure (mmHg): 177/64 Reference Range: 80 - 120  mg / dl Electronic Signature(s) Signed: 08/03/2018 8:16:30 AM By: Harold Barban Entered By: Harold Barban on 08/01/2018 11:07:48

## 2018-08-08 ENCOUNTER — Encounter: Payer: Medicare Other | Admitting: Internal Medicine

## 2018-08-08 ENCOUNTER — Other Ambulatory Visit: Payer: Self-pay

## 2018-08-08 ENCOUNTER — Other Ambulatory Visit: Payer: Self-pay | Admitting: Family Medicine

## 2018-08-08 DIAGNOSIS — E11622 Type 2 diabetes mellitus with other skin ulcer: Secondary | ICD-10-CM | POA: Diagnosis not present

## 2018-08-08 DIAGNOSIS — E1122 Type 2 diabetes mellitus with diabetic chronic kidney disease: Secondary | ICD-10-CM | POA: Diagnosis not present

## 2018-08-08 DIAGNOSIS — I120 Hypertensive chronic kidney disease with stage 5 chronic kidney disease or end stage renal disease: Secondary | ICD-10-CM | POA: Diagnosis not present

## 2018-08-08 DIAGNOSIS — E11621 Type 2 diabetes mellitus with foot ulcer: Secondary | ICD-10-CM | POA: Diagnosis not present

## 2018-08-08 DIAGNOSIS — L97221 Non-pressure chronic ulcer of left calf limited to breakdown of skin: Secondary | ICD-10-CM | POA: Diagnosis not present

## 2018-08-08 DIAGNOSIS — I890 Lymphedema, not elsewhere classified: Secondary | ICD-10-CM | POA: Diagnosis not present

## 2018-08-08 DIAGNOSIS — E1140 Type 2 diabetes mellitus with diabetic neuropathy, unspecified: Secondary | ICD-10-CM | POA: Diagnosis not present

## 2018-08-08 DIAGNOSIS — I872 Venous insufficiency (chronic) (peripheral): Secondary | ICD-10-CM

## 2018-08-08 DIAGNOSIS — L97222 Non-pressure chronic ulcer of left calf with fat layer exposed: Secondary | ICD-10-CM | POA: Diagnosis not present

## 2018-08-08 NOTE — Progress Notes (Signed)
Capell, [name] ([id number]) Visit Report for 08/08/2018 HPI Details Patient Name: Carol Ramsey, Carol Ramsey. Date of Service: 08/08/2018 9:00 AM Medical Record Number: [id number] Patient Account Number: [id number] Date of Birth/Sex: 05-Apr-1946 (73 y.o. F) Treating RN: Cornell Barman Primary Care Provider: Cranford Mon, Delfino Lovett Other Clinician: Referring Provider: Cranford Mon, RICHARD Treating Provider/Extender: Tito Dine in Treatment: 7 History of Present Illness HPI Description: ADMISSION 06/20/2018 This is a 73 year old woman who has known problems with chronic venous insufficiency and bilateral lower extremity edema. She is also a type II diabetic with recent hemoglobin A1c of 7.2. She tells me in the last 3 to 4 months she is developed a weeping nonhealing area on the left posterior calf. There was no obvious trauma here. She thought she might of had a skin tag on the back of this and was scratching and wonder is if this actually caused the area. She is simply been putting dry gauze on the top of this. She has not been using compression stockings and does not think she would be able to get these on. She does have compression pumps and she says she uses them once a day on most days. She was seen at her primary doctor's office on April 10, 2018 at which time she was felt to have wound cellulitis. She was followed up a week later and given Lasix 20 mg. Culture of the wound grew Pseudomonas and staph and was given a combination of doxycycline transitioning to Cipro. She tells me she follows at a vein clinic in [place]. She had some form of what sounds like sclerotherapy on the medial right upper thigh just above her knee yesterday. Past medical history includes ductal carcinoma of the right breast, type 2 diabetes, venous insufficiency with bilateral lower extremity edema, sciatica, left total knee replacement, hyperthyroidism and hyperlipidemia ABIs on the right were 0.96 and noncompressible on  the left 2/26; patient we admitted to the clinic last week. She has severe bilateral lymphedema. Since she was last here she appears to have had an ablation on the right leg probably the small saphenous vein at a vein clinic in [place]. They are apparently looking to do the same on the left. Her wound areas on the posterior part of the left calf small draining open area with some surrounding skin which is erythematous macerated and looks at some risk. We put her in 3 layer compression last week. She is telling me she uses her pumps twice a day. We have some improvement in the overall edema but not as much as I was hoping 3/4; patient we admitted to the clinic 2 weeks ago. She has severe bilateral lymphedema. The wound areas on the left posterior calf. She is also going to a vein clinic in [place] and she tells me that she actually had ablations done on the left leg prior to her coming here. I knew she had the right leg done last week but I was not aware that they had previously worked on the left leg. She tells me she is going for an ultrasound tomorrow. 3/11; we have been putting 4-layer compression on the left leg. Small open wound on the left posterior calf is better this week with improved dimensions. She has had venous ablations and tells me she is going to start using her compression pumps today. She has a single external compression garment stocking that we will put on the right leg today and make sure that she can actually do this herself. If  she can then we will order one for the left leg. She was cautioned that she can use her compression pumps either over our wraps or compression stockings of any form 3/18-Patient returns to clinic with severe bilateral lymphedema, she had taken the left 4 layer wrap off on Sunday on account of pain in the upper part of the ankle, patient has had some weeping and drainage. She claims to be using the pumps as indicated. We are going to move to a 3 layer  compression to reduce the discomfort level. The wound itself appears to be doing better on the left medial aspect of left leg 3/25; patient had her 4 layer changed to 3 layer wrap last time because of discomfort. She claims to be using her pumps twice a day as indicated. We have been using silver alginate drawtex ABDs under 3 layer compression 4/1; we still do not have good edema control although she is likely sitting and using her pumps with her leg dependent. She has 2 open areas. The area itself looks somewhat better. We have been using silver alginate, changed her to [employer], [name] ([id number]) this week 4/8; much better edema control. She is claims she is using her pumps twice a day. Only 1 open area remains and it is very superficial on the posterior left calf Electronic Signature(s) Signed: 08/08/2018 3:44:34 PM By: Linton Ham MD Entered By: Linton Ham on 08/08/2018 09:28:56 Carol Ramsey, [name] ([id number]) -------------------------------------------------------------------------------- Physical Exam Details Patient Name: [name], [name] Date of Service: 08/08/2018 9:00 AM Medical Record Number: [id number] Patient Account Number: [id number] Date of Birth/Sex: 10-Sep-1945 (73 y.o. F) Treating RN: Cornell Barman Primary Care Provider: Cranford Mon, Delfino Lovett Other Clinician: Referring Provider: Cranford Mon, RICHARD Treating Provider/Extender: Tito Dine in Treatment: 7 Constitutional Patient is hypertensive.. Pulse regular and within target range for patient.[name] Respirations regular, non-labored and within target range.. Temperature is normal and within the target range for the patient.[name] appears in no distress. Eyes Conjunctivae clear. No discharge. Respiratory Respiratory effort is easy and symmetric bilaterally. Rate is normal at rest and on room air.. Cardiovascular Normal on the left. Integumentary (Hair, Skin) Lymphedema. No periwound erythema is  seen. Notes Wound exam; left posterior calf wound. This is just about closed small superficial open area remains. There were 2 open areas last week. The edema control is much better.. Wound erythema is resolved Electronic Signature(s) Signed: 08/08/2018 3:44:34 PM By: Linton Ham MD Entered By: Linton Ham on 08/08/2018 09:30:10 Carol Ramsey, [name] ([id number]) -------------------------------------------------------------------------------- Physician Orders Details Patient Name: DENNISHA, MOUSER. Date of Service: 08/08/2018 9:00 AM Medical Record Number: [id number] Patient Account Number: [id number] Date of Birth/Sex: Dec 17, 1945 (73 y.o. F) Treating RN: Cornell Barman Primary Care Provider: Cranford Mon, Delfino Lovett Other Clinician: Referring Provider: Cranford Mon, RICHARD Treating Provider/Extender: Tito Dine in Treatment: 7 Verbal / Phone Orders: No Diagnosis Coding Wound Cleansing Wound #1 Left,Posterior Lower Leg o May shower with protection. - Cast Protector Anesthetic (add to Medication List) Wound #1 Left,Posterior Lower Leg o Topical Lidocaine 4% cream applied to wound bed prior to debridement (In Clinic Only). Skin Barriers/Peri-Wound Care Wound #1 Left,Posterior Lower Leg o Triamcinolone Acetonide Ointment (TCA) - peri-wound only Primary Wound Dressing Wound #1 Left,Posterior Lower Leg o Hydrafera Blue Ready Transfer Secondary Dressing Wound #1 Left,Posterior Lower Leg o ABD pad - Drawtex if needed Dressing Change Frequency Wound #1 Left,Posterior Lower Leg o Change dressing every week Follow-up Appointments Wound #1 Left,Posterior Lower Leg o  Return Appointment in 1 week. o Nurse Visit as needed Edema Control Wound #1 Left,Posterior Lower Leg o 3 Layer Compression System - Left Lower Extremity - Unna to anchor o Patient to wear own compression stockings - Farrow on right leg o Elevate legs to the level of the heart and pump ankles as  often as possible Additional Orders / Instructions Wound #1 Left,Posterior Lower Leg o Other: - Use pumps 2 times per day, every day Cafiero, Edgar L. ([id number]) Electronic Signature(s) Signed: 08/08/2018 3:44:34 PM By: Linton Ham MD Signed: 08/08/2018 4:47:28 PM By: Gretta Cool, BSN, RN, CWS, Kim RN, BSN Entered By: Gretta Cool, BSN, RN, CWS, Kim on 08/08/2018 19:50:93 Carol Ramsey, Carol Ramsey ([id number]) -------------------------------------------------------------------------------- Problem List Details Patient Name: Carol Ramsey, Carol Ramsey. Date of Service: 08/08/2018 9:00 AM Medical Record Number: [id number] Patient Account Number: [id number] Date of Birth/Sex: [birth year] (73 y.o. F) Treating RN: Cornell Barman Primary Care Provider: Cranford Mon, Delfino Lovett Other Clinician: Referring Provider: Cranford Mon, RICHARD Treating Provider/Extender: Tito Dine in Treatment: 7 Active Problems ICD-10 Evaluated Encounter Code Description Active Date Today Diagnosis L97.221 Non-pressure chronic ulcer of left calf limited to breakdown of 07/11/2018 No Yes skin I89.0 Lymphedema, not elsewhere classified 06/20/2018 No Yes I87.312 Chronic venous hypertension (idiopathic) with ulcer of left 06/20/2018 No Yes lower extremity Inactive Problems Resolved Problems Electronic Signature(s) Signed: 08/08/2018 3:44:34 PM By: Linton Ham MD Entered By: Linton Ham on 08/08/2018 09:28:18 Tipps, [name] ([id number]) -------------------------------------------------------------------------------- Progress Note Details Patient Name: Carol Ramsey, Carol Ramsey. Date of Service: 08/08/2018 9:00 AM Medical Record Number: [id number] Patient Account Number: [id number] Date of Birth/Sex: 09-21-1945 (73 y.o. F) Treating RN: Cornell Barman Primary Care Provider: Cranford Mon, Delfino Lovett Other Clinician: Referring Provider: Cranford Mon, RICHARD Treating Provider/Extender: Tito Dine in Treatment: 7 Subjective History of Present Illness  (HPI) ADMISSION 06/20/2018 This is a 73 year old woman who has known problems with chronic venous insufficiency and bilateral lower extremity edema. She is also a type II diabetic with recent hemoglobin A1c of 7.2. She tells me in the last 3 to 4 months she is developed a weeping nonhealing area on the left posterior calf. There was no obvious trauma here. She thought she might of had a skin tag on the back of this and was scratching and wonder is if this actually caused the area. She is simply been putting dry gauze on the top of this. She has not been using compression stockings and does not think she would be able to get these on. She does have compression pumps and she says she uses them once a day on most days. She was seen at her primary doctor's office on April 10, 2018 at which time she was felt to have wound cellulitis. She was followed up a week later and given Lasix 20 mg. Culture of the wound grew Pseudomonas and staph and was given a combination of doxycycline transitioning to Cipro. She tells me she follows at a vein clinic in [place]. She had some form of what sounds like sclerotherapy on the medial right upper thigh just above her knee yesterday. Past medical history includes ductal carcinoma of the right breast, type 2 diabetes, venous insufficiency with bilateral lower extremity edema, sciatica, left total knee replacement, hyperthyroidism and hyperlipidemia ABIs on the right were 0.96 and noncompressible on the left 2/26; patient we admitted to the clinic last week. She has severe bilateral lymphedema. Since she was last here she appears to have had an ablation on the right leg probably the  small saphenous vein at a vein clinic in [place]. They are apparently looking to do the same on the left. Her wound areas on the posterior part of the left calf small draining open area with some surrounding skin which is erythematous macerated and looks at some risk. We put her in 3 layer  compression last week. She is telling me she uses her pumps twice a day. We have some improvement in the overall edema but not as much as I was hoping 3/4; patient we admitted to the clinic 2 weeks ago. She has severe bilateral lymphedema. The wound areas on the left posterior calf. She is also going to a vein clinic in [place] and she tells me that she actually had ablations done on the left leg prior to her coming here. I knew she had the right leg done last week but I was not aware that they had previously worked on the left leg. She tells me she is going for an ultrasound tomorrow. 3/11; we have been putting 4-layer compression on the left leg. Small open wound on the left posterior calf is better this week with improved dimensions. She has had venous ablations and tells me she is going to start using her compression pumps today. She has a single external compression garment stocking that we will put on the right leg today and make sure that she can actually do this herself. If she can then we will order one for the left leg. She was cautioned that she can use her compression pumps either over our wraps or compression stockings of any form 3/18-Patient returns to clinic with severe bilateral lymphedema, she had taken the left 4 layer wrap off on Sunday on account of pain in the upper part of the ankle, patient has had some weeping and drainage. She claims to be using the pumps as indicated. We are going to move to a 3 layer compression to reduce the discomfort level. The wound itself appears to be doing better on the left medial aspect of left leg 3/25; patient had her 4 layer changed to 3 layer wrap last time because of discomfort. She claims to be using her pumps twice a day as indicated. We have been using silver alginate drawtex ABDs under 3 layer compression 4/1; we still do not have good edema control although she is likely sitting and using her pumps with her leg dependent. She has 2  open areas. The area itself looks somewhat better. We have been using silver alginate, changed her to [facility] this week 4/8; much better edema control. She is claims she is using her pumps twice a day. Only 1 open area remains and it is very superficial on the posterior left calf Carol Ramsey, [name] ([id number]) Objective Constitutional Patient is hypertensive.. Pulse regular and within target range for patient.[name] Respirations regular, non-labored and within target range.. Temperature is normal and within the target range for the patient.[name] appears in no distress. Vitals Time Taken: 9:03 AM, Height: 63 in, Weight: 257 lbs, BMI: 45.5, Temperature: 97.8 F, Pulse: 61 bpm, Respiratory Rate: 16 breaths/min, Blood Pressure: 186/64 mmHg. Eyes Conjunctivae clear. No discharge. Respiratory Respiratory effort is easy and symmetric bilaterally. Rate is normal at rest and on room air.. Cardiovascular Normal on the left. General Notes: Wound exam; left posterior calf wound. This is just about closed small superficial open area remains. There were 2 open areas last week. The edema control is much better.. Wound erythema is resolved Integumentary (Hair, Skin)  Lymphedema. No periwound erythema is seen. Wound #1 status is Open. Original cause of wound was Gradually Appeared. The wound is located on the Left,Posterior Lower Leg. The wound measures 0.4cm length x 0.4cm width x 0.1cm depth; 0.126cm^2 area and 0.013cm^3 volume. There is Fat Layer (Subcutaneous Tissue) Exposed exposed. There is no tunneling or undermining noted. There is a large amount of serous drainage noted. The wound margin is indistinct and nonvisible. There is medium (34-66%) pink granulation within the wound bed. There is a small (1-33%) amount of necrotic tissue within the wound bed including Eschar. The periwound skin appearance exhibited: Dry/Scaly. The periwound skin appearance did not exhibit: Callus, Crepitus,  Excoriation, Induration, Rash, Scarring, Maceration, Atrophie Blanche, Cyanosis, Ecchymosis, Hemosiderin Staining, Mottled, Pallor, Rubor, Erythema. Periwound temperature was noted as No Abnormality. The periwound has tenderness on palpation. Assessment Active Problems ICD-10 Non-pressure chronic ulcer of left calf limited to breakdown of skin Lymphedema, not elsewhere classified Chronic venous hypertension (idiopathic) with ulcer of left lower extremity Diagnoses Carol Ramsey, [name] ([id number]) ICD-10 L97.221: Non-pressure chronic ulcer of left calf limited to breakdown of skin I89.0: Lymphedema, not elsewhere classified I87.312: Chronic venous hypertension (idiopathic) with ulcer of left lower extremity Procedures Wound #1 Pre-procedure diagnosis of Wound #1 is a Diabetic Wound/Ulcer of the Lower Extremity located on the Left,Posterior Lower Leg . There was a Three Layer Compression Therapy Procedure by Cornell Barman, RN. Post procedure Diagnosis Wound #1: Same as Pre-Procedure Plan Wound Cleansing: Wound #1 Left,Posterior Lower Leg: May shower with protection. - Cast Protector Anesthetic (add to Medication List): Wound #1 Left,Posterior Lower Leg: Topical Lidocaine 4% cream applied to wound bed prior to debridement (In Clinic Only). Skin Barriers/Peri-Wound Care: Wound #1 Left,Posterior Lower Leg: Triamcinolone Acetonide Ointment (TCA) - peri-wound only Primary Wound Dressing: Wound #1 Left,Posterior Lower Leg: Hydrafera Blue Ready Transfer Secondary Dressing: Wound #1 Left,Posterior Lower Leg: ABD pad - Drawtex if needed Dressing Change Frequency: Wound #1 Left,Posterior Lower Leg: Change dressing every week Follow-up Appointments: Wound #1 Left,Posterior Lower Leg: Return Appointment in 1 week. Nurse Visit as needed Edema Control: Wound #1 Left,Posterior Lower Leg: 3 Layer Compression System - Left Lower Extremity - Unna to anchor Patient to wear own compression stockings  - Farrow on right leg Elevate legs to the level of the heart and pump ankles as often as possible Additional Orders / Instructions: Wound #1 Left,Posterior Lower Leg: Other: - Use pumps 2 times per day, every day Carol Ramsey, [name] ([id number]) 1. Continue with TCA/Hydrofera Blue/ABDs under 3 layer compression 2. Should be closed next week. She has a wraparound stocking which she will bring to clinic next week. Electronic Signature(s) Signed: 08/08/2018 3:44:34 PM By: Linton Ham MD Entered By: Linton Ham on 08/08/2018 09:31:10 Carol Ramsey, Carol Ramsey ([id number]) -------------------------------------------------------------------------------- SuperBill Details Patient Name: JANYCE, ELLINGER. Date of Service: 08/08/2018 Medical Record Number: [id number] Patient Account Number: [id number] Date of Birth/Sex: Sep 03, 1945 (73 y.o. F) Treating RN: Cornell Barman Primary Care Provider: Cranford Mon, Delfino Lovett Other Clinician: Referring Provider: Cranford Mon, RICHARD Treating Provider/Extender: Tito Dine in Treatment: 7 Diagnosis Coding ICD-10 Codes Code Description [phone] Non-pressure chronic ulcer of left calf limited to breakdown of skin I89.0 Lymphedema, not elsewhere classified I87.312 Chronic venous hypertension (idiopathic) with ulcer of left lower extremity Facility Procedures CPT4 Code: [id number] Description: (Facility Use Only) [phone] - APPLY MULTLAY COMPRS LWR LT LEG Modifier: Quantity: 1 Physician Procedures CPT4 Code: 3762831 Description: 51761 - WC PHYS LEVEL 3 - EST PT ICD-10 Diagnosis Description L97.221  Non-pressure chronic ulcer of left calf limited to breakdown I89.0 Lymphedema, not elsewhere classified I87.312 Chronic venous hypertension (idiopathic) with ulcer of left  l Modifier: of skin ower extremity Quantity: 1 Electronic Signature(s) Signed: 08/08/2018 3:44:34 PM By: Linton Ham MD Entered By: Linton Ham on 08/08/2018 09:31:32

## 2018-08-08 NOTE — Progress Notes (Signed)
Bohnenkamp, Xaviera L. ([id number]) Visit Report for 08/08/2018 Arrival Information Details Patient Name: GLESSIE, EUSTICE. Date of Service: 08/08/2018 9:00 AM Medical Record Number: [id number] Patient Account Number: [id number] Date of Birth/Sex: 1945-10-09 (73 y.o. F) Treating RN: Army Melia Primary Care Baraka Klatt: [name] Other Clinician: Referring Carmell Elgin: [name] Treating Katana Berthold/Extender: Tito Dine in Treatment: 7 Visit Information History Since Last Visit Added or deleted any medications: No Patient Arrived: Cane Any new allergies or adverse reactions: No Arrival Time: 09:03 Had a fall or experienced change in No Accompanied By: self activities of daily living that may affect Transfer Assistance: None risk of falls: Patient Has Alerts: Yes Signs or symptoms of abuse/neglect since last visito No Patient Alerts: DMII Hospitalized since last visit: No Has Dressing in Place as Prescribed: Yes Pain Present Now: No Electronic Signature(s) Signed: 08/08/2018 12:01:06 PM By: Army Melia Entered By: Army Melia on 08/08/2018 09:03:34 Aldea, Calleigh L. ([id number]) -------------------------------------------------------------------------------- Compression Therapy Details Patient Name: RAI, SINAGRA. Date of Service: 08/08/2018 9:00 AM Medical Record Number: [id number] Patient Account Number: [id number] Date of Birth/Sex: Mar 07, 1946 (73 y.o. F) Treating RN: Cornell Barman Primary Care Anita Mcadory: Cranford Mon, Delfino Lovett Other Clinician: Referring Maelyn Berrey: Cranford Mon, RICHARD Treating Kalden Wanke/Extender: Tito Dine in Treatment: 7 Compression Therapy Performed for Wound Assessment: Wound #1 Left,Posterior Lower Leg Performed By: Clinician Cornell Barman, RN Compression Type: Three Layer Post Procedure Diagnosis Same as Pre-procedure Electronic Signature(s) Signed: 08/08/2018 4:47:28 PM By: Gretta Cool, BSN, RN, CWS, Kim RN, BSN Entered By: Gretta Cool, BSN, RN,  CWS, Kim on 08/08/2018 16:01:09 Heckel, Reyne L. ([id number]) -------------------------------------------------------------------------------- Encounter Discharge Information Details Patient Name: AVANTHIKA, DEHNERT. Date of Service: 08/08/2018 9:00 AM Medical Record Number: [id number] Patient Account Number: [id number] Date of Birth/Sex: 1945/09/08 (73 y.o. F) Treating RN: Army Melia Primary Care Kelly Eisler: [name] Other Clinician: Referring Shalicia Craghead: Cranford Mon, RICHARD Treating Leasha Goldberger/Extender: Tito Dine in Treatment: 7 Encounter Discharge Information Items Discharge Condition: Stable Ambulatory Status: Cane Discharge Destination: Home Transportation: Private Auto Accompanied By: self Schedule Follow-up Appointment: Yes Clinical Summary of Care: Electronic Signature(s) Signed: 08/08/2018 12:01:06 PM By: Army Melia Entered By: Army Melia on 08/08/2018 09:42:15 Logan, Socorro L. ([id number]) -------------------------------------------------------------------------------- Lower Extremity Assessment Details Patient Name: LARK, LANGENFELD. Date of Service: 08/08/2018 9:00 AM Medical Record Number: [id number] Patient Account Number: [id number] Date of Birth/Sex: 06/01/1945 (73 y.o. F) Treating RN: Army Melia Primary Care Cobi Aldape: [name] Other Clinician: Referring Heatherly Stenner: Cranford Mon, RICHARD Treating Hezekiah Veltre/Extender: Ricard Dillon Weeks in Treatment: 7 Edema Assessment Assessed: [Left: No] [Right: No] Edema: [Left: N] [Right: o] Vascular Assessment Pulses: Dorsalis Pedis Palpable: [Left:Yes] Electronic Signature(s) Signed: 08/08/2018 12:01:06 PM By: Army Melia Entered By: Army Melia on 08/08/2018 09:10:39 Mcree, [place] ([id number]) -------------------------------------------------------------------------------- Multi Wound Chart Details Patient Name: KOREENA, JOOST. Date of Service: 08/08/2018 9:00 AM Medical Record Number:  [id number] Patient Account Number: [id number] Date of Birth/Sex: 1945/06/17 (73 y.o. F) Treating RN: Cornell Barman Primary Care Azuri Bozard: Cranford Mon, Delfino Lovett Other Clinician: Referring Evani Shrider: Cranford Mon, RICHARD Treating Zaida Reiland/Extender: Tito Dine in Treatment: 7 Vital Signs Height(in): 62 Pulse(bpm): 63 Weight(lbs): 81 Blood Pressure(mmHg): 186/64 Body Mass Index(BMI): 46 Temperature(F): 97.8 Respiratory Rate 16 (breaths/min): Photos: [1:No Photos] [N/A:N/A] Wound Location: [1:Left Lower Leg - Posterior] [N/A:N/A] Wounding Event: [1:Gradually Appeared] [N/A:N/A] Primary Etiology: [1:Diabetic Wound/Ulcer of the Lower Extremity] [N/A:N/A] Secondary Etiology: [1:Lymphedema] [N/A:N/A] Comorbid History: [1:Lymphedema, Hypertension, Peripheral Venous Disease, Type II Diabetes, End Stage Renal Disease, Osteoarthritis,  Neuropathy, Received Radiation] [N/A:N/A] Date Acquired: [1:02/26/2018] [N/A:N/A] Weeks of Treatment: [1:7] [N/A:N/A] Wound Status: [1:Open] [N/A:N/A] Measurements L x W x D [1:0.4x0.4x0.1] [N/A:N/A] (cm) Area (cm) : [1:0.126] [N/A:N/A] Volume (cm) : [1:0.013] [N/A:N/A] % Reduction in Area: [1:99.20%] [N/A:N/A] % Reduction in Volume: [1:99.10%] [N/A:N/A] Classification: [1:Grade 1] [N/A:N/A] Exudate Amount: [1:Large] [N/A:N/A] Exudate Type: [1:Serous] [N/A:N/A] Exudate Color: [1:amber] [N/A:N/A] Wound Margin: [1:Indistinct, nonvisible] [N/A:N/A] Granulation Amount: [1:Medium (34-66%)] [N/A:N/A] Granulation Quality: [1:Pink] [N/A:N/A] Necrotic Amount: [1:Small (1-33%)] [N/A:N/A] Necrotic Tissue: [1:Eschar] [N/A:N/A] Exposed Structures: [1:Fat Layer (Subcutaneous Tissue) Exposed: Yes Fascia: No Tendon: No Muscle: No Joint: No Bone: No] [N/A:N/A] Epithelialization: Medium (34-66%) N/A N/A Periwound Skin Texture: Excoriation: No N/A N/A Induration: No Callus: No Crepitus: No Rash: No Scarring: No Periwound Skin Moisture: Dry/Scaly: Yes N/A  N/A Maceration: No Periwound Skin Color: Atrophie Blanche: No N/A N/A Cyanosis: No Ecchymosis: No Erythema: No Hemosiderin Staining: No Mottled: No Pallor: No Rubor: No Temperature: No Abnormality N/A N/A Tenderness on Palpation: Yes N/A N/A Treatment Notes Electronic Signature(s) Signed: 08/08/2018 3:44:34 PM By: Linton Ham MD Entered By: Linton Ham on 08/08/2018 09:28:26 Schwier, Chester ([id number]) -------------------------------------------------------------------------------- [place] Details Patient Name: DEBHORA, TITUS. Date of Service: 08/08/2018 9:00 AM Medical Record Number: [id number] Patient Account Number: [id number] Date of Birth/Sex: [birth year] (73 y.o. F) Treating RN: Cornell Barman Primary Care Taneisha Fuson: Cranford Mon, Delfino Lovett Other Clinician: Referring Lattie Cervi: Cranford Mon, RICHARD Treating Darrin Apodaca/Extender: Tito Dine in Treatment: 7 Active Inactive Abuse / Safety / Falls / Self Care Management Nursing Diagnoses: Impaired physical mobility Potential for falls Goals: Patient will remain injury free related to falls Date Initiated: 06/20/2018 Target Resolution Date: 07/19/2018 Goal Status: Active Interventions: Assess fall risk on admission and as needed Notes: Orientation to the Wound Care Program Nursing Diagnoses: Knowledge deficit related to the wound healing center program Goals: Patient/caregiver will verbalize understanding of the [place] Program Date Initiated: 06/20/2018 Target Resolution Date: 06/20/2018 Goal Status: Active Interventions: Provide education on orientation to the wound center Notes: Wound/Skin Impairment Nursing Diagnoses: Impaired tissue integrity Goals: Ulcer/skin breakdown will have a volume reduction of 30% by week 4 Date Initiated: 06/20/2018 Target Resolution Date: 07/19/2018 Goal Status: Active Interventions: Carreto, Halona L. ([id number]) Assess patient/caregiver  ability to obtain necessary supplies Assess patient/caregiver ability to perform ulcer/skin care regimen upon admission and as needed Assess ulceration(s) every visit Provide education on ulcer and skin care Notes: Electronic Signature(s) Signed: 08/08/2018 4:47:28 PM By: Gretta Cool, BSN, RN, CWS, Kim RN, BSN Entered By: Gretta Cool, BSN, RN, CWS, Kim on 08/08/2018 09:26:39 Lambertson, Desteni L. ([id number]) -------------------------------------------------------------------------------- Pain Assessment Details Patient Name: IMARA, STANDIFORD. Date of Service: 08/08/2018 9:00 AM Medical Record Number: [id number] Patient Account Number: [id number] Date of Birth/Sex: Oct 19, 1945 (73 y.o. F) Treating RN: Army Melia Primary Care Stephannie Broner: [name] Other Clinician: Referring Vyron Fronczak: [name] Treating Aviel Davalos/Extender: Tito Dine in Treatment: 7 Active Problems Location of Pain Severity and Description of Pain Patient Has Paino No Site Locations Pain Management and Medication Current Pain Management: Electronic Signature(s) Signed: 08/08/2018 12:01:06 PM By: Army Melia Entered By: Army Melia on 08/08/2018 09:03:39 Sundquist, [place] ([id number]) -------------------------------------------------------------------------------- Patient/Caregiver Education Details Patient Name: TENICIA, GURAL. Date of Service: 08/08/2018 9:00 AM Medical Record Number: [id number] Patient Account Number: [id number] Date of Birth/Gender: 18-Mar-1946 (73 y.o. F) Treating RN: Cornell Barman Primary Care Physician: Cranford Mon, Delfino Lovett Other Clinician: Referring Physician: Cranford Mon, RICHARD Treating Physician/Extender: Tito Dine in Treatment: 7 Education  Assessment Education Provided To: Patient Education Topics Provided Wound/Skin Impairment: Handouts: Caring for Your Ulcer Methods: Demonstration, Explain/Verbal Responses: State content correctly Electronic Signature(s) Signed:  08/08/2018 4:47:28 PM By: Gretta Cool, BSN, RN, CWS, Kim RN, BSN Entered By: Gretta Cool, BSN, RN, CWS, Kim on 08/08/2018 09:29:15 Dutkiewicz, Enedelia L. ([id number]) -------------------------------------------------------------------------------- Wound Assessment Details Patient Name: IEASHA, BOEREMA. Date of Service: 08/08/2018 9:00 AM Medical Record Number: [id number] Patient Account Number: [id number] Date of Birth/Sex: 06/10/1945 (73 y.o. F) Treating RN: Army Melia Primary Care Jermon Chalfant: Cranford Mon, Delfino Lovett Other Clinician: Referring Alaura Schippers: Cranford Mon, RICHARD Treating Joniel Graumann/Extender: Tito Dine in Treatment: 7 Wound Status Wound Number: 1 Primary Diabetic Wound/Ulcer of the Lower Extremity Etiology: Wound Location: Left Lower Leg - Posterior Secondary Lymphedema Wounding Event: Gradually Appeared Etiology: Date Acquired: 02/26/2018 Wound Open Weeks Of Treatment: 7 Status: Clustered Wound: No Comorbid Lymphedema, Hypertension, Peripheral Venous History: Disease, Type II Diabetes, End Stage Renal Disease, Osteoarthritis, Neuropathy, Received Radiation Photos Photo Uploaded By: Army Melia on 08/08/2018 12:43:56 Wound Measurements Length: (cm) 0.4 Width: (cm) 0.4 Depth: (cm) 0.1 Area: (cm) 0.126 Volume: (cm) 0.013 % Reduction in Area: 99.2% % Reduction in Volume: 99.1% Epithelialization: Medium (34-66%) Tunneling: No Undermining: No Wound Description Classification: Grade 1 Wound Margin: Indistinct, nonvisible Exudate Amount: Large Exudate Type: Serous Exudate Color: amber Foul Odor After Cleansing: No Slough/Fibrino Yes Wound Bed Granulation Amount: Medium (34-66%) Exposed Structure Granulation Quality: Pink Fascia Exposed: No Necrotic Amount: Small (1-33%) Fat Layer (Subcutaneous Tissue) Exposed: Yes Necrotic Quality: Eschar Tendon Exposed: No Muscle Exposed: No Joint Exposed: No Marrs, Anastasia L. ([id number]) Bone Exposed: No Periwound Skin  Texture Texture Color No Abnormalities Noted: No No Abnormalities Noted: No Callus: No Atrophie Blanche: No Crepitus: No Cyanosis: No Excoriation: No Ecchymosis: No Induration: No Erythema: No Rash: No Hemosiderin Staining: No Scarring: No Mottled: No Pallor: No Moisture Rubor: No No Abnormalities Noted: No Dry / Scaly: Yes Temperature / Pain Maceration: No Temperature: No Abnormality Tenderness on Palpation: Yes Treatment Notes Wound #1 (Left, Posterior Lower Leg) Notes hydrofera blue, ABD, drawtex, 3layer Electronic Signature(s) Signed: 08/08/2018 12:01:06 PM By: Army Melia Entered By: Army Melia on 08/08/2018 09:10:30 Golda, Maxima L. ([id number]) -------------------------------------------------------------------------------- Vitals Details Patient Name: ALIZON, SCHMELING. Date of Service: 08/08/2018 9:00 AM Medical Record Number: [id number] Patient Account Number: [id number] Date of Birth/Sex: 31-May-1945 (73 y.o. F) Treating RN: Army Melia Primary Care Djibril Glogowski: Cranford Mon, Delfino Lovett Other Clinician: Referring Jaxx Huish: Cranford Mon, RICHARD Treating Yamaris Cummings/Extender: Tito Dine in Treatment: 7 Vital Signs Time Taken: 09:03 Temperature (F): 97.8 Height (in): 63 Pulse (bpm): 61 Weight (lbs): 257 Respiratory Rate (breaths/min): 16 Body Mass Index (BMI): 45.5 Blood Pressure (mmHg): 186/64 Reference Range: 80 - 120 mg / dl Electronic Signature(s) Signed: 08/08/2018 12:01:06 PM By: Army Melia Entered By: Army Melia on 08/08/2018 09:05:21

## 2018-08-15 ENCOUNTER — Other Ambulatory Visit: Payer: Self-pay

## 2018-08-15 ENCOUNTER — Encounter: Payer: Medicare Other | Admitting: Internal Medicine

## 2018-08-15 DIAGNOSIS — E1122 Type 2 diabetes mellitus with diabetic chronic kidney disease: Secondary | ICD-10-CM | POA: Diagnosis not present

## 2018-08-15 DIAGNOSIS — E11621 Type 2 diabetes mellitus with foot ulcer: Secondary | ICD-10-CM | POA: Diagnosis not present

## 2018-08-15 DIAGNOSIS — I120 Hypertensive chronic kidney disease with stage 5 chronic kidney disease or end stage renal disease: Secondary | ICD-10-CM | POA: Diagnosis not present

## 2018-08-15 DIAGNOSIS — S81802A Unspecified open wound, left lower leg, initial encounter: Secondary | ICD-10-CM | POA: Diagnosis not present

## 2018-08-15 DIAGNOSIS — E1140 Type 2 diabetes mellitus with diabetic neuropathy, unspecified: Secondary | ICD-10-CM | POA: Diagnosis not present

## 2018-08-15 DIAGNOSIS — I890 Lymphedema, not elsewhere classified: Secondary | ICD-10-CM | POA: Diagnosis not present

## 2018-08-15 DIAGNOSIS — L97221 Non-pressure chronic ulcer of left calf limited to breakdown of skin: Secondary | ICD-10-CM | POA: Diagnosis not present

## 2018-08-15 NOTE — Progress Notes (Signed)
Carol Ramsey, [name] ([id number]) Visit Report for 08/15/2018 HPI Details Patient Name: Carol Ramsey, Carol Ramsey. Date of Service: 08/15/2018 8:45 AM Medical Record Number: [id number] Patient Account Number: [id number] Date of Birth/Sex: [birth year] (73 y.o. F) Treating RN: Carol Ramsey Primary Care Provider: Cranford Mon, Delfino Ramsey Other Clinician: Referring Provider: Cranford Mon, Ramsey Treating Provider/Extender: Carol Ramsey in Treatment: 8 History of Present Illness HPI Description: ADMISSION 06/20/2018 This is a 73 year old woman who has known problems with chronic venous insufficiency and bilateral lower extremity edema. She is also a type II diabetic with recent hemoglobin A1c of 7.2. She tells me in the last 3 to 4 months she is developed a weeping nonhealing area on the left posterior calf. There was no obvious trauma here. She thought she might of had a skin tag on the back of this and was scratching and wonder is if this actually caused the area. She is simply been putting dry gauze on the top of this. She has not been using compression stockings and does not think she would be able to get these on. She does have compression pumps and she says she uses them once a day on most days. She was seen at her primary doctor's office on April 10, 2018 at which time she was felt to have wound cellulitis. She was followed up a week later and given Lasix 20 mg. Culture of the wound grew Pseudomonas and staph and was given a combination of doxycycline transitioning to Cipro. She tells me she follows at a vein clinic in [place]. She had some form of what sounds like sclerotherapy on the medial right upper thigh just above her knee yesterday. Past medical history includes ductal carcinoma of the right breast, type 2 diabetes, venous insufficiency with bilateral lower extremity edema, sciatica, left total knee replacement, hyperthyroidism and hyperlipidemia ABIs on the right were 0.96 and noncompressible  on the left 2/26; patient we admitted to the clinic last week. She has severe bilateral lymphedema. Since she was last here she appears to have had an ablation on the right leg probably the small saphenous vein at a vein clinic in [place]. They are apparently looking to do the same on the left. Her wound areas on the posterior part of the left calf small draining open area with some surrounding skin which is erythematous macerated and looks at some risk. We put her in 3 layer compression last week. She is telling me she uses her pumps twice a day. We have some improvement in the overall edema but not as much as I was hoping 3/4; patient we admitted to the clinic 2 weeks ago. She has severe bilateral lymphedema. The wound areas on the left posterior calf. She is also going to a vein clinic in [place] and she tells me that she actually had ablations done on the left leg prior to her coming here. I knew she had the right leg done last week but I was not aware that they had previously worked on the left leg. She tells me she is going for an ultrasound tomorrow. 3/11; we have been putting 4-layer compression on the left leg. Small open wound on the left posterior calf is better this week with improved dimensions. She has had venous ablations and tells me she is going to start using her compression pumps today. She has a single external compression garment stocking that we will put on the right leg today and make sure that she can actually do this herself. If  she can then we will order one for the left leg. She was cautioned that she can use her compression pumps either over our wraps or compression stockings of any form 3/18-Patient returns to clinic with severe bilateral lymphedema, she had taken the left 4 layer wrap off on Sunday on account of pain in the upper part of the ankle, patient has had some weeping and drainage. She claims to be using the pumps as indicated. We are going to move to a 3 layer  compression to reduce the discomfort level. The wound itself appears to be doing better on the left medial aspect of left leg 3/25; patient had her 4 layer changed to 3 layer wrap last time because of discomfort. She claims to be using her pumps twice a day as indicated. We have been using silver alginate drawtex ABDs under 3 layer compression 4/1; we still do not have good edema control although she is likely sitting and using her pumps with her leg dependent. She has 2 open areas. The area itself looks somewhat better. We have been using silver alginate, changed her to Carol Ramsey, [name] ([id number]) this week 4/8; much better edema control. She is claims she is using her pumps twice a day. Only 1 open area remains and it is very superficial on the posterior left calf 4/15; the patient arrives today with a wound on her left posterior calf healed. She has an external compression stocking/wraparound stocking. She is using her compression pumps twice a day. The patient has bilateral lymphedema Electronic Signature(s) Signed: 08/15/2018 2:01:47 PM By: Carol Ham MD Entered By: Carol Ramsey on 08/15/2018 09:13:28 Carol Ramsey, Carol Ramsey ([id number]) -------------------------------------------------------------------------------- Physical Exam Details Patient Name: [name], [name] Date of Service: 08/15/2018 8:45 AM Medical Record Number: [id number] Patient Account Number: [id number] Date of Birth/Sex: 1945/11/07 (73 y.o. F) Treating RN: Carol Ramsey Primary Care Provider: Cranford Mon, Delfino Ramsey Other Clinician: Referring Provider: Cranford Mon, Ramsey Treating Provider/Extender: Carol Ramsey in Treatment: 8 Constitutional Patient is hypertensive.. Pulse regular and within target range for patient.[name] Respirations regular, non-labored and within target range.. Temperature is normal and within the target range for the patient.[name] appears in no distress. Eyes Conjunctivae clear. No  discharge. Respiratory Respiratory effort is easy and symmetric bilaterally. Rate is normal at rest and on room air.. Cardiovascular The patient has lymphedema however her control of her edema is quite good. Lymphatic None palpable in the popliteal area. Integumentary (Hair, Skin) Lymphedema in her bilateral lower extremities no other skin issue is seen. Psychiatric No evidence of depression, anxiety, or agitation. Calm, cooperative, and communicative. Appropriate interactions and affect.. Electronic Signature(s) Signed: 08/15/2018 2:01:47 PM By: Carol Ham MD Entered By: Carol Ramsey on 08/15/2018 09:15:19 Carol Ramsey, [name] ([id number]) -------------------------------------------------------------------------------- Physician Orders Details Patient Name: Carol Ramsey, VANLOAN. Date of Service: 08/15/2018 8:45 AM Medical Record Number: [id number] Patient Account Number: [id number] Date of Birth/Sex: [birth year] (73 y.o. F) Treating RN: Carol Ramsey Primary Care Provider: Cranford Mon, Delfino Ramsey Other Clinician: Referring Provider: Cranford Mon, Ramsey Treating Provider/Extender: Carol Ramsey in Treatment: 8 Verbal / Phone Orders: No Diagnosis Coding Edema Control o Patient to wear own Velcro compression garment. - Put on first thing in the morning and remove before bed at night. o Compression Pump: Use compression pump on left lower extremity for 30 minutes, twice daily. o Compression Pump: Use compression pump on right lower extremity for 30 minutes, twice daily. Discharge From [facility] Services o Discharge from [place] -  Treatment complete. Electronic Signature(s) Signed: 08/15/2018 2:01:47 PM By: Carol Ham MD Signed: 08/15/2018 3:17:52 PM By: Gretta Cool, BSN, RN, CWS, Kim RN, BSN Entered By: Gretta Cool, BSN, RN, CWS, Kim on 08/15/2018 09:08:49 Carol Ramsey, [name] ([id number]) -------------------------------------------------------------------------------- Problem List  Details Patient Name: ZAYLEY, ARRAS. Date of Service: 08/15/2018 8:45 AM Medical Record Number: [id number] Patient Account Number: [id number] Date of Birth/Sex: [birth year] (73 y.o. F) Treating RN: Carol Ramsey Primary Care Provider: Cranford Mon, Delfino Ramsey Other Clinician: Referring Provider: Cranford Mon, Ramsey Treating Provider/Extender: Carol Ramsey in Treatment: 8 Active Problems ICD-10 Evaluated Encounter Code Description Active Date Today Diagnosis L97.221 Non-pressure chronic ulcer of left calf limited to breakdown of 07/11/2018 No Yes skin I89.0 Lymphedema, not elsewhere classified 06/20/2018 No Yes I87.312 Chronic venous hypertension (idiopathic) with ulcer of left 06/20/2018 No Yes lower extremity Inactive Problems Resolved Problems Electronic Signature(s) Signed: 08/15/2018 2:01:47 PM By: Carol Ham MD Entered By: Carol Ramsey on 08/15/2018 09:11:16 Carol Ramsey, Carol Ramsey ([id number]) -------------------------------------------------------------------------------- Progress Note Details Patient Name: Carol Ramsey, Carol Ramsey. Date of Service: 08/15/2018 8:45 AM Medical Record Number: [id number] Patient Account Number: [id number] Date of Birth/Sex: 07-Feb-1946 (72 y.o. F) Treating RN: Carol Ramsey Primary Care Provider: Cranford Mon, Delfino Ramsey Other Clinician: Referring Provider: Cranford Mon, Ramsey Treating Provider/Extender: Carol Ramsey in Treatment: 8 Subjective History of Present Illness (HPI) ADMISSION 06/20/2018 This is a 73 year old woman who has known problems with chronic venous insufficiency and bilateral lower extremity edema. She is also a type II diabetic with recent hemoglobin A1c of 7.2. She tells me in the last 3 to 4 months she is developed a weeping nonhealing area on the left posterior calf. There was no obvious trauma here. She thought she might of had a skin tag on the back of this and was scratching and wonder is if this actually caused the area. She  is simply been putting dry gauze on the top of this. She has not been using compression stockings and does not think she would be able to get these on. She does have compression pumps and she says she uses them once a day on most days. She was seen at her primary doctor's office on April 10, 2018 at which time she was felt to have wound cellulitis. She was followed up a week later and given Lasix 20 mg. Culture of the wound grew Pseudomonas and staph and was given a combination of doxycycline transitioning to Cipro. She tells me she follows at a vein clinic in [place]. She had some form of what sounds like sclerotherapy on the medial right upper thigh just above her knee yesterday. Past medical history includes ductal carcinoma of the right breast, type 2 diabetes, venous insufficiency with bilateral lower extremity edema, sciatica, left total knee replacement, hyperthyroidism and hyperlipidemia ABIs on the right were 0.96 and noncompressible on the left 2/26; patient we admitted to the clinic last week. She has severe bilateral lymphedema. Since she was last here she appears to have had an ablation on the right leg probably the small saphenous vein at a vein clinic in [place]. They are apparently looking to do the same on the left. Her wound areas on the posterior part of the left calf small draining open area with some surrounding skin which is erythematous macerated and looks at some risk. We put her in 3 layer compression last week. She is telling me she uses her pumps twice a day. We have some improvement in the overall edema but not as much  as I was hoping 3/4; patient we admitted to the clinic 2 weeks ago. She has severe bilateral lymphedema. The wound areas on the left posterior calf. She is also going to a vein clinic in [place] and she tells me that she actually had ablations done on the left leg prior to her coming here. I knew she had the right leg done last week but I was not aware  that they had previously worked on the left leg. She tells me she is going for an ultrasound tomorrow. 3/11; we have been putting 4-layer compression on the left leg. Small open wound on the left posterior calf is better this week with improved dimensions. She has had venous ablations and tells me she is going to start using her compression pumps today. She has a single external compression garment stocking that we will put on the right leg today and make sure that she can actually do this herself. If she can then we will order one for the left leg. She was cautioned that she can use her compression pumps either over our wraps or compression stockings of any form 3/18-Patient returns to clinic with severe bilateral lymphedema, she had taken the left 4 layer wrap off on Sunday on account of pain in the upper part of the ankle, patient has had some weeping and drainage. She claims to be using the pumps as indicated. We are going to move to a 3 layer compression to reduce the discomfort level. The wound itself appears to be doing better on the left medial aspect of left leg 3/25; patient had her 4 layer changed to 3 layer wrap last time because of discomfort. She claims to be using her pumps twice a day as indicated. We have been using silver alginate drawtex ABDs under 3 layer compression 4/1; we still do not have good edema control although she is likely sitting and using her pumps with her leg dependent. She has 2 open areas. The area itself looks somewhat better. We have been using silver alginate, changed her to [facility] this week 4/8; much better edema control. She is claims she is using her pumps twice a day. Only 1 open area remains and it is very superficial on the posterior left calf Carol Ramsey, [name] ([id number]) 4/15; the patient arrives today with a wound on her left posterior calf healed. She has an external compression stocking/wraparound stocking. She is using her compression  pumps twice a day. The patient has bilateral lymphedema Objective Constitutional Patient is hypertensive.. Pulse regular and within target range for patient.[name] Respirations regular, non-labored and within target range.. Temperature is normal and within the target range for the patient.[name] appears in no distress. Vitals Time Taken: 8:45 AM, Height: 63 in, Weight: 257 lbs, BMI: 45.5, Temperature: 98.4 F, Pulse: 60 bpm, Respiratory Rate: 18 breaths/min, Blood Pressure: 168/54 mmHg. Eyes Conjunctivae clear. No discharge. Respiratory Respiratory effort is easy and symmetric bilaterally. Rate is normal at rest and on room air.. Cardiovascular The patient has lymphedema however her control of her edema is quite good. Lymphatic None palpable in the popliteal area. Psychiatric No evidence of depression, anxiety, or agitation. Calm, cooperative, and communicative. Appropriate interactions and affect.. Integumentary (Hair, Skin) Lymphedema in her bilateral lower extremities no other skin issue is seen. Wound #1 status is Healed - Epithelialized. Original cause of wound was Gradually Appeared. The wound is located on the Left,Posterior Lower Leg. The wound measures 0cm length x 0cm width x 0cm depth; 0cm^2 area  and 0cm^3 volume. There is no tunneling or undermining noted. There is a none present amount of drainage noted. The wound margin is indistinct and nonvisible. There is medium (34-66%) pink granulation within the wound bed. There is a small (1-33%) amount of necrotic tissue within the wound bed. The periwound skin appearance exhibited: Dry/Scaly. The periwound skin appearance did not exhibit: Callus, Crepitus, Excoriation, Induration, Rash, Scarring, Maceration, Atrophie Blanche, Cyanosis, Ecchymosis, Hemosiderin Staining, Mottled, Pallor, Rubor, Erythema. Periwound temperature was noted as No Abnormality. Assessment Active Problems ICD-10 Non-pressure chronic ulcer of left calf limited to  breakdown of skin Creppel, [name] ([id number]) Lymphedema, not elsewhere classified Chronic venous hypertension (idiopathic) with ulcer of left lower extremity Plan Edema Control: Patient to wear own Velcro compression garment. - Put on first thing in the morning and remove before bed at night. Compression Pump: Use compression pump on left lower extremity for 30 minutes, twice daily. Compression Pump: Use compression pump on right lower extremity for 30 minutes, twice daily. Discharge From [facility] Services: Discharge from [place] complete. 1. I went over with the patient control of her lymphedema emphasizing the use of her Velcro compression garment. 2. Emphasized that she should use her compression pumps twice a day 3. Skin lubrication at night Electronic Signature(s) Signed: 08/15/2018 2:01:47 PM By: Carol Ham MD Entered By: Carol Ramsey on 08/15/2018 09:16:03 Fobes, Bacliff ([id number]) -------------------------------------------------------------------------------- SuperBill Details Patient Name: Carol Ramsey, Carol Ramsey. Date of Service: 08/15/2018 Medical Record Number: [id number] Patient Account Number: [id number] Date of Birth/Sex: 1945/05/28 (73 y.o. F) Treating RN: Carol Ramsey Primary Care Provider: Cranford Mon, Delfino Ramsey Other Clinician: Referring Provider: Cranford Mon, Ramsey Treating Provider/Extender: Carol Ramsey in Treatment: 8 Diagnosis Coding ICD-10 Codes Code Description [phone] Non-pressure chronic ulcer of left calf limited to breakdown of skin I89.0 Lymphedema, not elsewhere classified I87.312 Chronic venous hypertension (idiopathic) with ulcer of left lower extremity Facility Procedures CPT4 Code: [id number] Description: [phone] - WOUND CARE VISIT-LEV 2 EST PT Modifier: Quantity: 1 Physician Procedures CPT4 Code: [id number] Description: 63845 - WC PHYS LEVEL 3 - EST PT ICD-10 Diagnosis Description L97.221 Non-pressure chronic ulcer of  left calf limited to breakdown I89.0 Lymphedema, not elsewhere classified I87.312 Chronic venous hypertension (idiopathic) with ulcer of left  l Modifier: of skin ower extremity Quantity: 1 Electronic Signature(s) Signed: 08/15/2018 2:01:47 PM By: Carol Ham MD Entered By: Carol Ramsey on 08/15/2018 09:16:21

## 2018-08-15 NOTE — Progress Notes (Signed)
Ramsey, [name] ([id number]) Visit Report for 08/15/2018 Arrival Information Details Patient Name: Carol Ramsey, Carol Ramsey. Date of Service: 08/15/2018 8:45 AM Medical Record Number: [id number] Patient Account Number: [id number] Date of Birth/Sex: 06-Jun-1945 (73 y.o. F) Treating RN: Harold Barban Primary Care Derick Seminara: Cranford Mon, Delfino Lovett Other Clinician: Referring Sully Dyment: Cranford Mon, RICHARD Treating Yanina Knupp/Extender: Tito Dine in Treatment: 8 Visit Information History Since Last Visit Added or deleted any medications: No Patient Arrived: Cane Any new allergies or adverse reactions: No Arrival Time: 08:45 Had a fall or experienced change in No Accompanied By: self activities of daily living that may affect Transfer Assistance: None risk of falls: Patient Identification Verified: Yes Signs or symptoms of abuse/neglect since last visito No Secondary Verification Process Completed: Yes Hospitalized since last visit: No Patient Has Alerts: Yes Has Dressing in Place as Prescribed: Yes Patient Alerts: DMII Has Compression in Place as Prescribed: Yes Pain Present Now: No Electronic Signature(s) Signed: 08/15/2018 1:06:36 PM By: Harold Barban Entered By: Harold Barban on 08/15/2018 08:46:24 Ramsey, [name] ([id number]) -------------------------------------------------------------------------------- Clinic Level of Care Assessment Details Patient Name: Carol Ramsey, Carol Ramsey. Date of Service: 08/15/2018 8:45 AM Medical Record Number: [id number] Patient Account Number: [id number] Date of Birth/Sex: 10-Jun-1945 (73 y.o. F) Treating RN: Cornell Barman Primary Care Romey Mathieson: Cranford Mon, Delfino Lovett Other Clinician: Referring Cherisa Brucker: Cranford Mon, RICHARD Treating Masato Pettie/Extender: Tito Dine in Treatment: 8 Clinic Level of Care Assessment Items TOOL 4 Quantity Score []  - Use when only an EandM is performed on FOLLOW-UP visit 0 ASSESSMENTS - Nursing Assessment / Reassessment X  - Reassessment of Co-morbidities (includes updates in patient status) 1 10 X- 1 5 Reassessment of Adherence to Treatment Plan ASSESSMENTS - Wound and Skin Assessment / Reassessment X - Simple Wound Assessment / Reassessment - one wound 1 5 []  - 0 Complex Wound Assessment / Reassessment - multiple wounds []  - 0 Dermatologic / Skin Assessment (not related to wound area) ASSESSMENTS - Focused Assessment []  - Circumferential Edema Measurements - multi extremities 0 []  - 0 Nutritional Assessment / Counseling / Intervention []  - 0 Lower Extremity Assessment (monofilament, tuning fork, pulses) []  - 0 Peripheral Arterial Disease Assessment (using hand held doppler) ASSESSMENTS - Ostomy and/or Continence Assessment and Care []  - Incontinence Assessment and Management 0 []  - 0 Ostomy Care Assessment and Management (repouching, etc.) PROCESS - Coordination of Care X - Simple Patient / Family Education for ongoing care 1 15 []  - 0 Complex (extensive) Patient / Family Education for ongoing care []  - 0 Staff obtains Programmer, systems, Records, Test Results / Process Orders []  - 0 Staff telephones HHA, Nursing Homes / Clarify orders / etc []  - 0 Routine Transfer to another Facility (non-emergent condition) []  - 0 Routine Hospital Admission (non-emergent condition) []  - 0 New Admissions / Biomedical engineer / Ordering NPWT, Apligraf, etc. []  - 0 Emergency Hospital Admission (emergent condition) X- 1 10 Simple Discharge Coordination Ramsey, [name] ([id number]) []  - 0 Complex (extensive) Discharge Coordination PROCESS - Special Needs []  - Pediatric / Minor Patient Management 0 []  - 0 Isolation Patient Management []  - 0 Hearing / Language / Visual special needs []  - 0 Assessment of Community assistance (transportation, D/C planning, etc.) []  - 0 Additional assistance / Altered mentation []  - 0 Support Surface(s) Assessment (bed, cushion, seat, etc.) INTERVENTIONS - Wound Cleansing /  Measurement X - Simple Wound Cleansing - one wound 1 5 []  - 0 Complex Wound Cleansing - multiple wounds X- 1 5 Wound Imaging (photographs -  any number of wounds) []  - 0 Wound Tracing (instead of photographs) X- 1 5 Simple Wound Measurement - one wound []  - 0 Complex Wound Measurement - multiple wounds INTERVENTIONS - Wound Dressings []  - Small Wound Dressing one or multiple wounds 0 []  - 0 Medium Wound Dressing one or multiple wounds []  - 0 Large Wound Dressing one or multiple wounds []  - 0 Application of Medications - topical []  - 0 Application of Medications - injection INTERVENTIONS - Miscellaneous []  - External ear exam 0 []  - 0 Specimen Collection (cultures, biopsies, blood, body fluids, etc.) []  - 0 Specimen(s) / Culture(s) sent or taken to Lab for analysis []  - 0 Patient Transfer (multiple staff / Civil Service fast streamer / Similar devices) []  - 0 Simple Staple / Suture removal (25 or less) []  - 0 Complex Staple / Suture removal (26 or more) []  - 0 Hypo / Hyperglycemic Management (close monitor of Blood Glucose) []  - 0 Ankle / Brachial Index (ABI) - do not check if billed separately X- 1 5 Vital Signs Carol Ramsey, [name] ([id number]) Has the patient been seen at the hospital within the last three years: Yes Total Score: 65 Level Of Care: New/Established - Level 2 Electronic Signature(s) Signed: 08/15/2018 3:17:52 PM By: Gretta Cool, BSN, RN, CWS, Kim RN, BSN Entered By: Gretta Cool, BSN, RN, CWS, Kim on 08/15/2018 09:09:45 Carol Ramsey, [name] ([id number]) -------------------------------------------------------------------------------- Lower Extremity Assessment Details Patient Name: Carol, Ramsey. Date of Service: 08/15/2018 8:45 AM Medical Record Number: [id number] Patient Account Number: [id number] Date of Birth/Sex: [birth year] (73 y.o. F) Treating RN: Harold Barban Primary Care Currie Dennin: Cranford Mon, Delfino Lovett Other Clinician: Referring Kimon Loewen: Cranford Mon, RICHARD Treating  Paige Vanderwoude/Extender: Tito Dine in Treatment: 8 Edema Assessment Assessed: [Left: No] [Right: No] [Left: Edema] [Right: :] Calf Left: Right: Point of Measurement: 33 cm From Medial Instep 51 cm cm Ankle Left: Right: Point of Measurement: 10 cm From Medial Instep 26.5 cm cm Vascular Assessment Pulses: Dorsalis Pedis Palpable: [Left:Yes] Posterior Tibial Palpable: [Left:Yes] Electronic Signature(s) Signed: 08/15/2018 1:06:36 PM By: Harold Barban Entered By: Harold Barban on 08/15/2018 08:56:26 Carol Ramsey, [name] ([id number]) -------------------------------------------------------------------------------- Multi Wound Chart Details Patient Name: Carol Ramsey, Carol Ramsey. Date of Service: 08/15/2018 8:45 AM Medical Record Number: [id number] Patient Account Number: [id number] Date of Birth/Sex: June 04, 1945 (73 y.o. F) Treating RN: Cornell Barman Primary Care Deby Adger: Cranford Mon, Delfino Lovett Other Clinician: Referring Saquan Furtick: Cranford Mon, RICHARD Treating Cheng Dec/Extender: Tito Dine in Treatment: 8 Vital Signs Height(in): 63 Pulse(bpm): 60 Weight(lbs): 257 Blood Pressure(mmHg): 168/54 Body Mass Index(BMI): 46 Temperature(F): 98.4 Respiratory Rate 18 (breaths/min): Photos: [N/A:N/A] Wound Location: Left, Posterior Lower Leg N/A N/A Wounding Event: Gradually Appeared N/A N/A Primary Etiology: Diabetic Wound/Ulcer of the N/A N/A Lower Extremity Secondary Etiology: Lymphedema N/A N/A Comorbid History: Lymphedema, Hypertension, N/A N/A Peripheral Venous Disease, Type II Diabetes, End Stage Renal Disease, Osteoarthritis, Neuropathy, Received Radiation Date Acquired: 02/26/2018 N/A N/A Weeks of Treatment: 8 N/A N/A Wound Status: Healed - Epithelialized N/A N/A Measurements L x W x D 0x0x0 N/A N/A (cm) Area (cm) : 0 N/A N/A Volume (cm) : 0 N/A N/A % Reduction in Area: 100.00% N/A N/A % Reduction in Volume: 100.00% N/A N/A Classification: Grade 1 N/A  N/A Exudate Amount: None Present N/A N/A Wound Margin: Indistinct, nonvisible N/A N/A Granulation Amount: Medium (34-66%) N/A N/A Granulation Quality: Pink N/A N/A Necrotic Amount: Small (1-33%) N/A N/A Exposed Structures: Fascia: No N/A N/A Fat Layer (Subcutaneous Tissue) Exposed: No Fielden, Harue L. ([id number]) Tendon: No  Muscle: No Joint: No Bone: No Epithelialization: Medium (34-66%) N/A N/A Periwound Skin Texture: Excoriation: No N/A N/A Induration: No Callus: No Crepitus: No Rash: No Scarring: No Periwound Skin Moisture: Dry/Scaly: Yes N/A N/A Maceration: No Periwound Skin Color: Atrophie Blanche: No N/A N/A Cyanosis: No Ecchymosis: No Erythema: No Hemosiderin Staining: No Mottled: No Pallor: No Rubor: No Temperature: No Abnormality N/A N/A Tenderness on Palpation: No N/A N/A Treatment Notes Electronic Signature(s) Signed: 08/15/2018 2:01:47 PM By: Linton Ham MD Entered By: Linton Ham on 08/15/2018 09:11:25 Carol Ramsey, [name] ([id number]) -------------------------------------------------------------------------------- [place] Details Patient Name: Carol Ramsey, Carol Ramsey. Date of Service: 08/15/2018 8:45 AM Medical Record Number: [id number] Patient Account Number: [id number] Date of Birth/Sex: 05-30-1945 (73 y.o. F) Treating RN: Cornell Barman Primary Care Armentha Branagan: Cranford Mon, Delfino Lovett Other Clinician: Referring Taeja Debellis: Cranford Mon, RICHARD Treating Braxen Dobek/Extender: Tito Dine in Treatment: 8 Active Inactive Electronic Signature(s) Signed: 08/15/2018 3:17:52 PM By: Gretta Cool, BSN, RN, CWS, Kim RN, BSN Entered By: Gretta Cool, BSN, RN, CWS, Kim on 08/15/2018 [place], [place] ([id number]) -------------------------------------------------------------------------------- Pain Assessment Details Patient Name: Carol Ramsey, Carol Ramsey. Date of Service: 08/15/2018 8:45 AM Medical Record Number: [id number] Patient Account Number:  [id number] Date of Birth/Sex: 02-21-1946 (73 y.o. F) Treating RN: Harold Barban Primary Care Katheen Aslin: Cranford Mon, Delfino Lovett Other Clinician: Referring Ariaunna Longsworth: Cranford Mon, RICHARD Treating Keilynn Marano/Extender: Tito Dine in Treatment: 8 Active Problems Location of Pain Severity and Description of Pain Patient Has Paino No Site Locations Pain Management and Medication Current Pain Management: Electronic Signature(s) Signed: 08/15/2018 1:06:36 PM By: Harold Barban Entered By: Harold Barban on 08/15/2018 08:47:55 Roel, Darlene L. ([id number]) -------------------------------------------------------------------------------- Patient/Caregiver Education Details Patient Name: Carol Ramsey, Carol Ramsey. Date of Service: 08/15/2018 8:45 AM Medical Record Number: [id number] Patient Account Number: [id number] Date of Birth/Gender: 13-Jul-1945 (73 y.o. F) Treating RN: Cornell Barman Primary Care Physician: Cranford Mon, Delfino Lovett Other Clinician: Referring Physician: [name] Treating Physician/Extender: Tito Dine in Treatment: 8 Education Assessment Education Provided To: Patient Education Topics Provided Venous: Handouts: Controlling Swelling with Compression Stockings Methods: Demonstration, Explain/Verbal Responses: State content correctly Electronic Signature(s) Signed: 08/15/2018 3:17:52 PM By: Gretta Cool, BSN, RN, CWS, Kim RN, BSN Entered By: Gretta Cool, BSN, RN, CWS, Kim on 08/15/2018 09:10:23 Gage, Adilene L. ([id number]) -------------------------------------------------------------------------------- Wound Assessment Details Patient Name: Carol Ramsey, Carol Ramsey. Date of Service: 08/15/2018 8:45 AM Medical Record Number: [id number] Patient Account Number: [id number] Date of Birth/Sex: 1946-04-27 (73 y.o. F) Treating RN: Cornell Barman Primary Care Rashid Whitenight: Cranford Mon, Delfino Lovett Other Clinician: Referring Brek Reece: Cranford Mon, RICHARD Treating Wadsworth Skolnick/Extender: Tito Dine  in Treatment: 8 Wound Status Wound Number: 1 Primary Diabetic Wound/Ulcer of the Lower Extremity Etiology: Wound Location: Left, Posterior Lower Leg Secondary Lymphedema Wounding Event: Gradually Appeared Etiology: Date Acquired: 02/26/2018 Wound Healed - Epithelialized Weeks Of Treatment: 8 Status: Clustered Wound: No Comorbid Lymphedema, Hypertension, Peripheral Venous History: Disease, Type II Diabetes, End Stage Renal Disease, Osteoarthritis, Neuropathy, Received Radiation Photos Wound Measurements Length: (cm) 0 % Reduc Width: (cm) 0 % Reduc Depth: (cm) 0 Epithel Area: (cm) 0 Tunnel Volume: (cm) 0 Underm tion in Area: 100% tion in Volume: 100% ialization: Medium (34-66%) ing: No ining: No Wound Description Classification: Grade 1 Wound Margin: Indistinct, nonvisible Exudate Amount: None Present Foul Odor After Cleansing: No Slough/Fibrino No Wound Bed Granulation Amount: Medium (34-66%) Exposed Structure Granulation Quality: Pink Fascia Exposed: No Necrotic Amount: Small (1-33%) Fat Layer (Subcutaneous Tissue) Exposed: No Tendon Exposed: No Muscle Exposed: No Joint Exposed: No Bone Exposed: No  Periwound Skin Texture Carol Ramsey, [name] ([id number]) Texture Color No Abnormalities Noted: No No Abnormalities Noted: No Callus: No Atrophie Blanche: No Crepitus: No Cyanosis: No Excoriation: No Ecchymosis: No Induration: No Erythema: No Rash: No Hemosiderin Staining: No Scarring: No Mottled: No Pallor: No Moisture Rubor: No No Abnormalities Noted: No Dry / Scaly: Yes Temperature / Pain Maceration: No Temperature: No Abnormality Electronic Signature(s) Signed: 08/15/2018 3:17:52 PM By: Gretta Cool, BSN, RN, CWS, Kim RN, BSN Entered By: Gretta Cool, BSN, RN, CWS, Kim on 08/15/2018 09:06:39 Carol Ramsey, Breahna L. ([id number]) -------------------------------------------------------------------------------- Vitals Details Patient Name: JOVANI, COLQUHOUN. Date of  Service: 08/15/2018 8:45 AM Medical Record Number: [id number] Patient Account Number: [id number] Date of Birth/Sex: 1946/04/03 (73 y.o. F) Treating RN: Harold Barban Primary Care Ladonna Vanorder: Cranford Mon, Delfino Lovett Other Clinician: Referring Jeferson Boozer: Cranford Mon, RICHARD Treating Avir Deruiter/Extender: Tito Dine in Treatment: 8 Vital Signs Time Taken: 08:45 Temperature (F): 98.4 Height (in): 63 Pulse (bpm): 60 Weight (lbs): 257 Respiratory Rate (breaths/min): 18 Body Mass Index (BMI): 45.5 Blood Pressure (mmHg): 168/54 Reference Range: 80 - 120 mg / dl Electronic Signature(s) Signed: 08/15/2018 1:06:36 PM By: Harold Barban Entered By: Harold Barban on 08/15/2018 08:48:20

## 2018-09-11 DIAGNOSIS — M1712 Unilateral primary osteoarthritis, left knee: Secondary | ICD-10-CM | POA: Diagnosis not present

## 2018-09-11 DIAGNOSIS — Z96652 Presence of left artificial knee joint: Secondary | ICD-10-CM | POA: Diagnosis not present

## 2018-09-14 DIAGNOSIS — E782 Mixed hyperlipidemia: Secondary | ICD-10-CM | POA: Diagnosis not present

## 2018-09-20 ENCOUNTER — Ambulatory Visit (INDEPENDENT_AMBULATORY_CARE_PROVIDER_SITE_OTHER): Payer: Medicare Other

## 2018-09-20 ENCOUNTER — Other Ambulatory Visit: Payer: Self-pay

## 2018-09-20 ENCOUNTER — Encounter: Payer: Medicare Other | Admitting: Family Medicine

## 2018-09-20 DIAGNOSIS — E2839 Other primary ovarian failure: Secondary | ICD-10-CM | POA: Diagnosis not present

## 2018-09-20 DIAGNOSIS — Z0000 Encounter for general adult medical examination without abnormal findings: Secondary | ICD-10-CM

## 2018-09-20 NOTE — Progress Notes (Signed)
Subjective:   Carol Ramsey is a 73 y.o. female who presents for Medicare Annual (Subsequent) preventive examination.    This visit is being conducted through telemedicine due to the COVID-19 pandemic. This patient has given me verbal consent via doximity to conduct this visit, patient states they are participating from their home address. Some vital signs may be absent or patient reported.    Patient identification: identified by name, DOB, and current address  Review of Systems:  N/A  Cardiac Risk Factors include: advanced age (>50men, >37 women);diabetes mellitus;dyslipidemia;hypertension;obesity (BMI >30kg/m2)     Objective:     Vitals: There were no vitals taken for this visit.  There is no height or weight on file to calculate BMI. Unable to obtain vitals due to visit being conducted via telephonically.   Advanced Directives 09/20/2018 04/05/2018 02/28/2018 11/15/2017 09/19/2017 09/18/2017 05/10/2017  Does Patient Have a Medical Advance Directive? No No No No No No No  Would patient like information on creating a medical advance directive? No - Patient declined No - Patient declined No - Patient declined No - Patient declined No - Patient declined No - Patient declined No - Patient declined    Tobacco Social History   Tobacco Use  Smoking Status Never Smoker  Smokeless Tobacco Never Used     Counseling given: Not Answered   Clinical Intake:  Pre-visit preparation completed: Yes  Pain : No/denies pain Pain Score: 0-No pain     Nutritional Status: BMI > 30  Obese Nutritional Risks: None Diabetes: Yes  How often do you need to have someone help you when you read instructions, pamphlets, or other written materials from your doctor or pharmacy?: 1 - Never   Diabetes:  Is the patient diabetic?  Yes type 2 If diabetic, was a CBG obtained today?  No  Did the patient bring in their glucometer from home?  No  How often do you monitor your CBG's? A couple times a  week.   Financial Strains and Diabetes Management:  Are you having any financial strains with the device, your supplies or your medication? No .  Does the patient want to be seen by Chronic Care Management for management of their diabetes?  No  Would the patient like to be referred to a Nutritionist or for Diabetic Management?  No   Diabetic Exams:  Diabetic Eye Exam: Completed 10/2017.   Diabetic Foot Exam: Completed 01/26/17. Pt has been advised about the importance in completing this exam. Note made to f/u on this at next OV.    Interpreter Needed?: No  Information entered by :: [facility], LPN   Past Medical History:  Diagnosis Date  . Anemia   . Arthritis   . 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 Diabetes mellitus without complication ([place])   . Hyperlipidemia   . Hypertension   . Hyperthyroidism   . Obesity   . Personal history of radiation therapy   . Thyroid disease    hyperthyroidism  . Vitamin D deficiency    Past Surgical History:  Procedure Laterality Date  . BREAST BIOPSY Right 06/28/2016   stereotactic biopsy - DUCTAL CARCINOMA IN SITU (DCIS), HIGH NUCLEAR GRADE  (very narrow margins)  . BREAST LUMPECTOMY Right 07/18/2016  . JOINT REPLACEMENT Left    tkr  . KNEE SURGERY Left    arthroscopy  . MASTECTOMY, PARTIAL Right 07/18/2016  Procedure: MASTECTOMY PARTIAL;  Surgeon: Robert Bellow, MD;  Location: [facility] ORS;  Service: General;  Laterality: Right;  . SKIN CANCER EXCISION     face   Family History  Problem Relation Age of Onset  . Hypertension Mother   . Cervical cancer Mother   . Heart disease Father   . Hypertension Father   . Lung cancer Father   . Lung cancer Sister   . Brain cancer Sister   . Diabetes Brother   . Heart disease Brother   . Hypertension Brother   . Stroke Brother   . Breast cancer Neg Hx    Social History   Socioeconomic History  . Marital status:  Married    Spouse name: Not on file  . Number of children: 1  . Years of education: Not on file  . Highest education level: Bachelor's degree (e.g., BA, AB, BS)  Occupational History  . Occupation: retired  Scientific laboratory technician  . Financial resource strain: Not hard at all  . Food insecurity:    Worry: Never true    Inability: Never true  . Transportation needs:    Medical: No    Non-medical: No  Tobacco Use  . Smoking status: Never Smoker  . Smokeless tobacco: Never Used  Substance and Sexual Activity  . Alcohol use: No  . Drug use: No  . Sexual activity: Never  Lifestyle  . Physical activity:    Days per week: 0 days    Minutes per session: 0 min  . Stress: Not at all  Relationships  . Social connections:    Talks on phone: Patient refused    Gets together: Patient refused    Attends religious service: Patient refused    Active member of club or organization: Patient refused    Attends meetings of clubs or organizations: Patient refused    Relationship status: Patient refused  Other Topics Concern  . Not on file  Social History Narrative  . Not on file    Outpatient Encounter Medications as of 09/20/2018  Medication Sig  . acetaminophen (TYLENOL) 500 MG tablet Take 500 mg by mouth every 6 (six) hours as needed for mild pain or moderate pain.  [name] atorvastatin (LIPITOR) 10 MG tablet Take 10 mg by mouth daily at 6 PM.   . Cholecalciferol (VITAMIN D) 2000 units CAPS Take 2,000 Units by mouth daily.   . furosemide (LASIX) 20 MG tablet TAKE 1 TABLET BY MOUTH EVERY DAY  . glipiZIDE (GLUCOTROL XL) 5 MG 24 hr tablet TAKE 1 TABLETS BY MOUTH DAILY  . glucose blood (ONETOUCH VERIO) test strip Use as instructed; Check blood sugar twice daily  . [name] 50-1000 MG tablet TAKE 1 TABLET BY MOUTH TWICE DAILY WITH A MEAL  . lisinopril-hydrochlorothiazide (PRINZIDE,ZESTORETIC) 20-12.5 MG tablet TAKE 2 TABLETS BY MOUTH EVERY MORNING  . Magnesium 500 MG CAPS Take 1 capsule (500 mg total) by  mouth at bedtime.  . metoprolol succinate (TOPROL-XL) 100 MG 24 hr tablet TAKE 1 TABLET BY MOUTH DAILY WITH OR IMMEDIATELY FOLLOWING A MEAL  . pioglitazone (ACTOS) 45 MG tablet TAKE 1 TABLET(45 MG) BY MOUTH DAILY  . tamoxifen (NOLVADEX) 20 MG tablet TAKE 1 TABLET(20 MG) BY MOUTH DAILY  . Calcium Carbonate-Vit D-Min (GNP CALCIUM 1200) 1200-1000 MG-UNIT CHEW Chew 1,200 mg by mouth daily with breakfast. Take in combination with vitamin D and magnesium. (Patient not taking: Reported on 09/20/2018)  . ciprofloxacin (CIPRO) 500 MG tablet Take 1 tablet (500 mg total) by  mouth 2 (two) times daily. (Patient not taking: Reported on 06/12/2018)   No facility-administered encounter medications on file as of 09/20/2018.     Activities of Daily Living In your present state of health, do you have any difficulty performing the following activities: 09/20/2018  Hearing? N  Vision? N  Comment Wears eye glasses daily.   Difficulty concentrating or making decisions? N  Walking or climbing stairs? Y  Comment Due to weakness in legs.  Dressing or bathing? N  Doing errands, shopping? N  Preparing Food and eating ? N  Using the Toilet? N  In the past six months, have you accidently leaked urine? N  Do you have problems with loss of bowel control? N  Managing your Medications? N  Managing your Finances? N  Housekeeping or managing your Housekeeping? N  Some recent data might be hidden    Patient Care Team: Jerrol Banana., MD as PCP - General (Family Medicine) Hooten, Laurice Record, MD as Consulting Physician (Orthopedic Surgery) Dasher, Rayvon Char, MD as Consulting Physician (Dermatology) Corey Skains, MD as Consulting Physician (Cardiology) Thelma Comp, OD as Consulting Physician (Optometry) Byrnett, Forest Gleason, MD (General Surgery) Noreene Filbert, MD as Referring Physician (Radiation Oncology) Lloyd Huger, MD as Consulting Physician (Oncology)    Assessment:   This is a routine  wellness examination for Sharice.  Exercise Activities and Dietary recommendations Current Exercise Habits: Home exercise routine, Type of exercise: stretching, Time (Minutes): 15, Frequency (Times/Week): 2, Weekly Exercise (Minutes/Week): 30, Intensity: Mild, Exercise limited by: None identified  Goals    . Exercise 3x per week (30 min per time)     Recommend to try the "sit down exercises" for 3 days a week for at least 20-30 minutes.     . Have 3 meals a day     Starting 05/12/15, I will work to make sure I am eating 3 meals a day.       Fall Risk: Fall Risk  09/20/2018 02/28/2018 02/01/2018 11/15/2017 09/18/2017  Falls in the past year? 0 No No No No    FALL RISK PREVENTION PERTAINING TO THE HOME:  Any stairs in or around the home? No  If so, are there any without handrails? N/A  Home free of loose throw rugs in walkways, pet beds, electrical cords, etc? Yes  Adequate lighting in your home to reduce risk of falls? Yes   ASSISTIVE DEVICES UTILIZED TO PREVENT FALLS:  Life alert? No  Use of a cane, walker or w/c? Yes  Grab bars in the bathroom? Yes  Shower chair or bench in shower? Yes  Elevated toilet seat or a handicapped toilet? Yes   TIMED UP AND GO:  Was the test performed? No .    Depression Screen PHQ 2/9 Scores 09/20/2018 02/28/2018 02/01/2018 09/18/2017  PHQ - 2 Score 0 0 0 0     Cognitive Function: Declined today.      6CIT Screen 05/11/2016  What Year? 0 points  What month? 0 points  What time? 0 points  Count back from 20 0 points  Months in reverse 0 points  Repeat phrase 0 points  Total Score 0    Immunization History  Administered Date(s) Administered  . Influenza Split 02/25/2006, 03/07/2011, 02/27/2012  . Influenza, High Dose Seasonal PF 03/03/2016, 01/26/2017, 01/08/2018  . Influenza,inj,Quad PF,6+ Mos 03/04/2013  . Pneumococcal Conjugate-13 04/13/2015  . Pneumococcal Polysaccharide-23 02/17/2007, 02/27/2012  . Td 10/21/2003  . Tdap 05/11/2016   . Zoster  11/08/2010    Qualifies for Shingles Vaccine? Yes  Zostavax completed 11/08/10. Due for Shingrix. Education has been provided regarding the importance of this vaccine. Pt has been advised to call insurance company to determine out of pocket expense. Advised may also receive vaccine at local pharmacy or Health Dept. Verbalized acceptance and understanding.  Tdap: Up to date  Flu Vaccine: Up to date  Pneumococcal Vaccine: Up to date  Screening Tests Health Maintenance  Topic Date Due  . DEXA SCAN  04/22/2013  . FOOT EXAM  01/26/2018  . OPHTHALMOLOGY EXAM  10/31/2018  . HEMOGLOBIN A1C  11/08/2018  . INFLUENZA VACCINE  12/01/2018  . MAMMOGRAM  06/18/2020  . COLONOSCOPY  01/18/2022  . TETANUS/TDAP  05/11/2026  . Hepatitis C Screening  Completed  . PNA vac Low Risk Adult  Completed    Cancer Screenings:  Colorectal Screening: Completed 01/19/12. Repeat every 10 years.  Mammogram: Completed 06/18/18.   Bone Density: Completed 11/28/11. Results reflect OSTEOPENIA. Repeat every 5 years. Ordered today. Pt provided with contact info and advised to call to schedule appt. Pt aware the office will call re: appt.  Lung Cancer Screening: (Low Dose CT Chest recommended if Age 85-80 years, 30 pack-year currently smoking OR have quit w/in 15years.) does not qualify.   Additional Screening:  Hepatitis C Screening: Up to date  Dental Screening: Recommended annual dental exams for proper oral hygiene   Community Resource Referral:  CRR required this visit?  No       Plan:  I have personally reviewed and addressed the Medicare Annual Wellness questionnaire and have noted the following in the patient's chart:  A. Medical and social history B. Use of alcohol, tobacco or illicit drugs  C. Current medications and supplements D. Functional ability and status E.  Nutritional status F.  Physical activity G. Advance directives H. List of other physicians I.  Hospitalizations,  surgeries, and ER visits in previous 12 months J.  [place] such as hearing and vision if needed, cognitive and depression L. Referrals and appointments   In addition, I have reviewed and discussed with patient certain preventive protocols, quality metrics, and best practice recommendations. A written personalized care plan for preventive services as well as general preventive health recommendations were provided to patient.   [place] Score, [place]  [phone] Nurse Health Advisor   Nurse Notes: Pt needs a diabetic foot exam at next OV. Requested records from last eye exam to update HM.

## 2018-09-20 NOTE — Patient Instructions (Signed)
Carol Ramsey , Thank you for taking time to come for your Medicare Wellness Visit. I appreciate your ongoing commitment to your health goals. Please review the following plan we discussed and let me know if I can assist you in the future.   Screening recommendations/referrals: Colonoscopy: Up to date, due 12/2021 Mammogram: Up to date, due 06/2020 Bone Density: Ordered today. Pt provided with contact info and advised to call to schedule appt. Pt aware the office will call re: appt. Recommended yearly ophthalmology/optometry visit for glaucoma screening and checkup Recommended yearly dental visit for hygiene and checkup  Vaccinations: Influenza vaccine: Up to date Pneumococcal vaccine: Completed series Tdap vaccine: Up to date, due 05/2026 Shingles vaccine: Pt declines today.     Advanced directives: Advance directive discussed with you today. Even though you declined this today please call our office should you change your mind and we can give you the proper paperwork for you to fill out.  Conditions/risks identified: Continue to increase exercises to 3 days a week for 20-30 minutes as tolerated and work to eat 3 small meals a day with 2 healthy snacks in between.   Next appointment: 10/29/18 @ 10:20 AM with Dr Rosanna Randy.    Preventive Care 62 Years and Older, Female Preventive care refers to lifestyle choices and visits with your health care provider that can promote health and wellness. What does preventive care include?  A yearly physical exam. This is also called an annual well check.  Dental exams once or twice a year.  Routine eye exams. Ask your health care provider how often you should have your eyes checked.  Personal lifestyle choices, including:  Daily care of your teeth and gums.  Regular physical activity.  Eating a healthy diet.  Avoiding tobacco and drug use.  Limiting alcohol use.  Practicing safe sex.  Taking low-dose aspirin every day.  Taking vitamin  and mineral supplements as recommended by your health care provider. What happens during an annual well check? The services and screenings done by your health care provider during your annual well check will depend on your age, overall health, lifestyle risk factors, and family history of disease. Counseling  Your health care provider may ask you questions about your:  Alcohol use.  Tobacco use.  Drug use.  Emotional well-being.  Home and relationship well-being.  Sexual activity.  Eating habits.  History of falls.  Memory and ability to understand (cognition).  Work and work Statistician.  Reproductive health. Screening  You may have the following tests or measurements:  Height, weight, and BMI.  Blood pressure.  Lipid and cholesterol levels. These may be checked every 5 years, or more frequently if you are over 63 years old.  Skin check.  Lung cancer screening. You may have this screening every year starting at age 70 if you have a 30-pack-year history of smoking and currently smoke or have quit within the past 15 years.  Fecal occult blood test (FOBT) of the stool. You may have this test every year starting at age 6.  Flexible sigmoidoscopy or colonoscopy. You may have a sigmoidoscopy every 5 years or a colonoscopy every 10 years starting at age 73.  Hepatitis C blood test.  Hepatitis B blood test.  Sexually transmitted disease (STD) testing.  Diabetes screening. This is done by checking your blood sugar (glucose) after you have not eaten for a while (fasting). You may have this done every 1-3 years.  Bone density scan. This is done to screen for  osteoporosis. You may have this done starting at age 76.  Mammogram. This may be done every 1-2 years. Talk to your health care provider about how often you should have regular mammograms. Talk with your health care provider about your test results, treatment options, and if necessary, the need for more tests.  Vaccines  Your health care provider may recommend certain vaccines, such as:  Influenza vaccine. This is recommended every year.  Tetanus, diphtheria, and acellular pertussis (Tdap, Td) vaccine. You may need a Td booster every 10 years.  Zoster vaccine. You may need this after age 37.  Pneumococcal 13-valent conjugate (PCV13) vaccine. One dose is recommended after age 48.  Pneumococcal polysaccharide (PPSV23) vaccine. One dose is recommended after age 3. Talk to your health care provider about which screenings and vaccines you need and how often you need them. This information is not intended to replace advice given to you by your health care provider. Make sure you discuss any questions you have with your health care provider. Document Released: 05/15/2015 Document Revised: 01/06/2016 Document Reviewed: 02/17/2015 Elsevier Interactive Patient Education  2017 [place] Prevention in the Home Falls can cause injuries. They can happen to people of all ages. There are many things you can do to make your home safe and to help prevent falls. What can I do on the outside of my home?  Regularly fix the edges of walkways and driveways and fix any cracks.  Remove anything that might make you trip as you walk through a door, such as a raised step or threshold.  Trim any bushes or trees on the path to your home.  Use bright outdoor lighting.  Clear any walking paths of anything that might make someone trip, such as rocks or tools.  Regularly check to see if handrails are loose or broken. Make sure that both sides of any steps have handrails.  Any raised decks and porches should have guardrails on the edges.  Have any leaves, snow, or ice cleared regularly.  Use sand or salt on walking paths during winter.  Clean up any spills in your garage right away. This includes oil or grease spills. What can I do in the bathroom?  Use night lights.  Install grab bars by the toilet  and in the tub and shower. Do not use towel bars as grab bars.  Use non-skid mats or decals in the tub or shower.  If you need to sit down in the shower, use a plastic, non-slip stool.  Keep the floor dry. Clean up any water that spills on the floor as soon as it happens.  Remove soap buildup in the tub or shower regularly.  Attach bath mats securely with double-sided non-slip rug tape.  Do not have throw rugs and other things on the floor that can make you trip. What can I do in the bedroom?  Use night lights.  Make sure that you have a light by your bed that is easy to reach.  Do not use any sheets or blankets that are too big for your bed. They should not hang down onto the floor.  Have a firm chair that has side arms. You can use this for support while you get dressed.  Do not have throw rugs and other things on the floor that can make you trip. What can I do in the kitchen?  Clean up any spills right away.  Avoid walking on wet floors.  Keep items that you use  a lot in easy-to-reach places.  If you need to reach something above you, use a strong step stool that has a grab bar.  Keep electrical cords out of the way.  Do not use floor polish or wax that makes floors slippery. If you must use wax, use non-skid floor wax.  Do not have throw rugs and other things on the floor that can make you trip. What can I do with my stairs?  Do not leave any items on the stairs.  Make sure that there are handrails on both sides of the stairs and use them. Fix handrails that are broken or loose. Make sure that handrails are as long as the stairways.  Check any carpeting to make sure that it is firmly attached to the stairs. Fix any carpet that is loose or worn.  Avoid having throw rugs at the top or bottom of the stairs. If you do have throw rugs, attach them to the floor with carpet tape.  Make sure that you have a light switch at the top of the stairs and the bottom of the  stairs. If you do not have them, ask someone to add them for you. What else can I do to help prevent falls?  Wear shoes that:  Do not have high heels.  Have rubber bottoms.  Are comfortable and fit you well.  Are closed at the toe. Do not wear sandals.  If you use a stepladder:  Make sure that it is fully opened. Do not climb a closed stepladder.  Make sure that both sides of the stepladder are locked into place.  Ask someone to hold it for you, if possible.  Clearly mark and make sure that you can see:  Any grab bars or handrails.  First and last steps.  Where the edge of each step is.  Use tools that help you move around (mobility aids) if they are needed. These include:  Canes.  Walkers.  Scooters.  Crutches.  Turn on the lights when you go into a dark area. Replace any light bulbs as soon as they burn out.  Set up your furniture so you have a clear path. Avoid moving your furniture around.  If any of your floors are uneven, fix them.  If there are any pets around you, be aware of where they are.  Review your medicines with your doctor. Some medicines can make you feel dizzy. This can increase your chance of falling. Ask your doctor what other things that you can do to help prevent falls. This information is not intended to replace advice given to you by your health care provider. Make sure you discuss any questions you have with your health care provider. Document Released: 02/12/2009 Document Revised: 09/24/2015 Document Reviewed: 05/23/2014 Elsevier Interactive Patient Education  2017 [employer].

## 2018-09-26 DIAGNOSIS — I83893 Varicose veins of bilateral lower extremities with other complications: Secondary | ICD-10-CM | POA: Diagnosis not present

## 2018-09-26 DIAGNOSIS — Z1159 Encounter for screening for other viral diseases: Secondary | ICD-10-CM | POA: Diagnosis not present

## 2018-09-28 ENCOUNTER — Telehealth: Payer: Self-pay | Admitting: Family Medicine

## 2018-09-28 DIAGNOSIS — J310 Chronic rhinitis: Secondary | ICD-10-CM | POA: Diagnosis not present

## 2018-09-28 NOTE — Telephone Encounter (Signed)
Pt states she is unable to do bone density that was ordered.She is unable to lay on the table because of pain in her back

## 2018-10-01 NOTE — Telephone Encounter (Signed)
FYI

## 2018-10-07 ENCOUNTER — Other Ambulatory Visit: Payer: Self-pay | Admitting: Family Medicine

## 2018-10-07 DIAGNOSIS — E119 Type 2 diabetes mellitus without complications: Secondary | ICD-10-CM

## 2018-10-07 NOTE — Progress Notes (Signed)
[place]  [phone]) [phone] Fax:(336) [phone]  ID: Carol Ramsey OB: 1946/01/18  MR#: [id number]  VVO#:[id number]  Patient Care Team: Jerrol Banana., MD as PCP - General (Family Medicine) Marry Guan, Laurice Record, MD as Consulting Physician (Orthopedic Surgery) Dasher, Rayvon Char, MD as Consulting Physician (Dermatology) Corey Skains, MD as Consulting Physician (Cardiology) Thelma Comp, Geraldine as Consulting Physician (Optometry) Bary Castilla, Forest Gleason, MD (General Surgery) Noreene Filbert, MD as Referring Physician (Radiation Oncology) Lloyd Huger, MD as Consulting Physician (Oncology)  I connected with Carol Ramsey on 10/08/18 at 10:45 AM EDT by telephone visit and verified that I am speaking with the correct person using two identifiers.   I discussed the limitations, risks, security and privacy concerns of performing an evaluation and management service by telemedicine and the availability of in-person appointments. I also discussed with the patient that there may be a patient responsible charge related to this service. The patient expressed understanding and agreed to proceed.   Other persons participating in the visit and their role in the encounter: Patient, MD  Patient's location: Home Provider's location: Clinic  CHIEF COMPLAINT: Right breast high grade DCIS  INTERVAL HISTORY: Patient agreed to her routine 22-monthevaluation via telephone today.  She currently feels well and is asymptomatic.  She is tolerating tamoxifen without significant side effects.  She no longer complains of lower extremity edema.  She has no neurologic complaints. She denies any recent fevers or illnesses. She has a good appetite and denies weight loss.  She denies any chest pain, shortness of breath, cough, or hemoptysis.  She denies any nausea, vomiting, constipation, or diarrhea. She has no urinary complaints.  Patient feels at her baseline offers no specific  complaints today.  REVIEW OF SYSTEMS:   Review of Systems  Constitutional: Negative.  Negative for fever, malaise/fatigue and weight loss.  Respiratory: Negative.  Negative for cough and shortness of breath.   Cardiovascular: Negative.  Negative for chest pain and leg swelling.  Gastrointestinal: Negative.  Negative for abdominal pain.  Genitourinary: Negative.  Negative for dysuria.  Musculoskeletal: Negative.  Negative for back pain.  Skin: Negative.  Negative for rash.  Neurological: Negative.  Negative for sensory change, focal weakness and weakness.  Endo/Heme/Allergies: Negative for environmental allergies.  Psychiatric/Behavioral: Negative.  The patient is not nervous/anxious.     As per HPI. Otherwise, a complete review of systems is negative.  PAST MEDICAL HISTORY: Past Medical History:  Diagnosis Date  . Anemia   . Arthritis   . [place] 06/28[place]2016   right breast DCIS grade 3  . 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 Diabetes mellitus without complication ([facility]   . Hyperlipidemia   . Hypertension   . Hyperthyroidism   . Obesity   . Personal history of radiation therapy   . Thyroid disease    hyperthyroidism  . Vitamin D deficiency     PAST SURGICAL HISTORY: Past Surgical History:  Procedure Laterality Date  . BREAST BIOPSY Right 06/28/2016   stereotactic biopsy - DUCTAL CARCINOMA IN SITU (DCIS), HIGH NUCLEAR GRADE  (very narrow margins)  . BREAST LUMPECTOMY Right 07/18/2016  . JOINT REPLACEMENT Left    tkr  . KNEE SURGERY Left    arthroscopy  . MASTECTOMY, PARTIAL Right 07/18/2016   Procedure: MASTECTOMY PARTIAL;  Surgeon: JRobert Bellow MD;  Location: [facility] ORS;  Service: General;  Laterality: Right;  . SKIN CANCER EXCISION  face    FAMILY HISTORY: Family History  Problem Relation Age of Onset  . Hypertension Mother   . Cervical cancer Mother   . Heart disease Father   . Hypertension Father   . Lung cancer Father   . Lung cancer  Sister   . Brain cancer Sister   . Diabetes Brother   . Heart disease Brother   . Hypertension Brother   . Stroke Brother   . Breast cancer Neg Hx     ADVANCED DIRECTIVES (Y/N):  N  HEALTH MAINTENANCE: Social History   Tobacco Use  . Smoking status: Never Smoker  . Smokeless tobacco: Never Used  Substance Use Topics  . Alcohol use: No  . Drug use: No     Colonoscopy:  PAP:  Bone density:  Lipid panel:  Allergies  Allergen Reactions  . Gabapentin Swelling  . Quinine Nausea Only and Nausea And Vomiting  . Amoxicillin Itching and Rash    Has patient had a PCN reaction causing immediate rash, facial/tongue/throat swelling, SOB or lightheadedness with hypotension: No Has patient had a PCN reaction causing severe rash involving mucus membranes or skin necrosis: No Has patient had a PCN reaction that required hospitalization No Has patient had a PCN reaction occurring within the last 10 years: No If all of the above answers are "NO", then may proceed with Cephalosporin use.   . Dilaudid  [Hydromorphone Hcl] Rash  . Etodolac Rash  . Hydrochlorothiazide Rash  . Hydrocodone Rash  . Hydromorphone Rash  . Sulfa Antibiotics Rash    Current Outpatient Medications  Medication Sig Dispense Refill  . acetaminophen (TYLENOL) 500 MG tablet Take 500 mg by mouth every 6 (six) hours as needed for mild pain or moderate pain.    [name] atorvastatin (LIPITOR) 10 MG tablet Take 10 mg by mouth daily at 6 PM.     . Calcium Carbonate-Vit D-Min (GNP CALCIUM 1200) 1200-1000 MG-UNIT CHEW Chew 1,200 mg by mouth daily with breakfast. Take in combination with vitamin D and magnesium. (Patient not taking: Reported on 09/20/2018) 30 tablet 5  . Cholecalciferol (VITAMIN D) 2000 units CAPS Take 2,000 Units by mouth daily.     . furosemide (LASIX) 20 MG tablet TAKE 1 TABLET BY MOUTH EVERY DAY 30 tablet 3  . glipiZIDE (GLUCOTROL XL) 5 MG 24 hr tablet TAKE 1 TABLET BY MOUTH DAILY 90 tablet 3  . glucose blood  (ONETOUCH VERIO) test strip Use as instructed; Check blood sugar twice daily 100 each 12  . [name] 50-1000 MG tablet TAKE 1 TABLET BY MOUTH TWICE DAILY WITH A MEAL 180 tablet 3  . lisinopril-hydrochlorothiazide (PRINZIDE,ZESTORETIC) 20-12.5 MG tablet TAKE 2 TABLETS BY MOUTH EVERY MORNING 60 tablet 12  . Magnesium 500 MG CAPS Take 1 capsule (500 mg total) by mouth at bedtime. 30 capsule 5  . metoprolol succinate (TOPROL-XL) 100 MG 24 hr tablet TAKE 1 TABLET BY MOUTH DAILY WITH OR IMMEDIATELY FOLLOWING A MEAL 90 tablet 3  . pioglitazone (ACTOS) 45 MG tablet TAKE 1 TABLET(45 MG) BY MOUTH DAILY 30 tablet 11  . tamoxifen (NOLVADEX) 20 MG tablet TAKE 1 TABLET(20 MG) BY MOUTH DAILY 90 tablet 3   No current facility-administered medications for this visit.     OBJECTIVE: There were no vitals filed for this visit.   There is no height or weight on file to calculate BMI.    ECOG FS:0 - Asymptomatic   LAB RESULTS:  Lab Results  Component Value Date   NA  140 05/10/2018   K 4.2 05/10/2018   CL 102 05/10/2018   CO2 21 05/10/2018   GLUCOSE 157 (H) 05/10/2018   BUN 21 05/10/2018   CREATININE 1.39 (H) 05/10/2018   CALCIUM 9.2 05/10/2018   PROT 6.2 02/05/2018   ALBUMIN 3.9 05/10/2018   AST 10 02/05/2018   ALT 8 09/18/2017   ALKPHOS 35 (L) 02/05/2018   BILITOT 0.3 02/05/2018   GFRNONAA 38 (L) 05/10/2018   GFRAA 44 (L) 05/10/2018    Lab Results  Component Value Date   WBC 6.7 09/18/2017   NEUTROABS 4.1 09/18/2017   HGB 11.3 09/18/2017   HCT 36.8 09/18/2017   MCV 91 09/18/2017   PLT 225 09/18/2017     STUDIES: No results found.  ASSESSMENT: Right breast high grade DCIS.  PLAN:    1. Right breast high grade DCIS: Final pathology was reviewed independently as also discussed at breast case conference. Patient had close margins less than 0.5 mm and was given the option for reexcision to obtain better margins or proceed directly with XRT. She declined reexcision and subsequently  underwent adjuvant XRT which she completed in May 2018.  Continue tamoxifen for a total of 5 years completing in May 2023.  Patient's most recent mammogram on June 18, 2018 was reported as BI-RADS 2.  Repeat in February 2021.  Return to clinic in 6 months for routine evaluation. 2  Lower extremity edema:  Resolved 3.  Environmental allergies: Continue OTC medications as needed.  I provided 15 minutes of non face-to-face telephone visit time during this encounter, and > 50% was spent counseling as documented under my assessment & plan.   Patient expressed understanding and was in agreement with this plan. She also understands that She can call clinic at any time with any questions, concerns, or complaints.   Cancer Staging Ductal carcinoma in situ (DCIS) of right breast Staging form: Breast, AJCC 8th Edition - Pathologic stage from 08/02/2016: Stage 0 (pTis (DCIS), pN0, cM0, ER: Positive, PR: Positive, HER2: Negative) - Signed by Lloyd Huger, MD on 08/02/2016   Lloyd Huger, MD   10/08/2018 12:18 PM

## 2018-10-08 ENCOUNTER — Inpatient Hospital Stay: Payer: Medicare Other | Attending: Oncology | Admitting: Oncology

## 2018-10-08 DIAGNOSIS — E039 Hypothyroidism, unspecified: Secondary | ICD-10-CM

## 2018-10-08 DIAGNOSIS — I10 Essential (primary) hypertension: Secondary | ICD-10-CM

## 2018-10-08 DIAGNOSIS — D0511 Intraductal carcinoma in situ of right breast: Secondary | ICD-10-CM

## 2018-10-08 DIAGNOSIS — Z79810 Long term (current) use of selective estrogen receptor modulators (SERMs): Secondary | ICD-10-CM

## 2018-10-08 DIAGNOSIS — Z170 Estrogen receptor positive status [ER+]: Secondary | ICD-10-CM

## 2018-10-08 DIAGNOSIS — E119 Type 2 diabetes mellitus without complications: Secondary | ICD-10-CM | POA: Diagnosis not present

## 2018-10-08 DIAGNOSIS — I119 Hypertensive heart disease without heart failure: Secondary | ICD-10-CM | POA: Diagnosis not present

## 2018-10-08 DIAGNOSIS — E782 Mixed hyperlipidemia: Secondary | ICD-10-CM | POA: Diagnosis not present

## 2018-10-08 DIAGNOSIS — Z79899 Other long term (current) drug therapy: Secondary | ICD-10-CM | POA: Diagnosis not present

## 2018-10-08 DIAGNOSIS — I272 Pulmonary hypertension, unspecified: Secondary | ICD-10-CM | POA: Diagnosis not present

## 2018-10-08 DIAGNOSIS — R600 Localized edema: Secondary | ICD-10-CM | POA: Diagnosis not present

## 2018-10-08 DIAGNOSIS — N183 Chronic kidney disease, stage 3 (moderate): Secondary | ICD-10-CM | POA: Diagnosis not present

## 2018-10-08 NOTE — Telephone Encounter (Signed)
Please review

## 2018-10-08 NOTE — Progress Notes (Signed)
Patient for telephone visit today for follow up regarding right breast DCIS. Patient completed check in via my chart. I have verified with patient that she is ready for telephone visit.

## 2018-10-16 DIAGNOSIS — M1711 Unilateral primary osteoarthritis, right knee: Secondary | ICD-10-CM | POA: Diagnosis not present

## 2018-10-16 DIAGNOSIS — M25561 Pain in right knee: Secondary | ICD-10-CM | POA: Diagnosis not present

## 2018-10-17 DIAGNOSIS — M25561 Pain in right knee: Secondary | ICD-10-CM | POA: Diagnosis not present

## 2018-10-17 DIAGNOSIS — M1711 Unilateral primary osteoarthritis, right knee: Secondary | ICD-10-CM | POA: Diagnosis not present

## 2018-10-17 DIAGNOSIS — M25761 Osteophyte, right knee: Secondary | ICD-10-CM | POA: Diagnosis not present

## 2018-10-23 DIAGNOSIS — M25761 Osteophyte, right knee: Secondary | ICD-10-CM | POA: Diagnosis not present

## 2018-10-23 DIAGNOSIS — M1711 Unilateral primary osteoarthritis, right knee: Secondary | ICD-10-CM | POA: Diagnosis not present

## 2018-10-23 DIAGNOSIS — M25561 Pain in right knee: Secondary | ICD-10-CM | POA: Diagnosis not present

## 2018-10-29 ENCOUNTER — Other Ambulatory Visit: Payer: Self-pay

## 2018-10-29 ENCOUNTER — Ambulatory Visit (INDEPENDENT_AMBULATORY_CARE_PROVIDER_SITE_OTHER): Payer: Medicare Other | Admitting: Family Medicine

## 2018-10-29 ENCOUNTER — Encounter: Payer: Self-pay | Admitting: Family Medicine

## 2018-10-29 VITALS — BP 126/62 | HR 58 | Temp 98.9°F | Resp 16 | Ht 63.0 in | Wt 240.0 lb

## 2018-10-29 DIAGNOSIS — E118 Type 2 diabetes mellitus with unspecified complications: Secondary | ICD-10-CM

## 2018-10-29 DIAGNOSIS — D0511 Intraductal carcinoma in situ of right breast: Secondary | ICD-10-CM | POA: Diagnosis not present

## 2018-10-29 DIAGNOSIS — I890 Lymphedema, not elsewhere classified: Secondary | ICD-10-CM

## 2018-10-29 DIAGNOSIS — M1712 Unilateral primary osteoarthritis, left knee: Secondary | ICD-10-CM

## 2018-10-29 DIAGNOSIS — N183 Chronic kidney disease, stage 3 unspecified: Secondary | ICD-10-CM

## 2018-10-29 DIAGNOSIS — E119 Type 2 diabetes mellitus without complications: Secondary | ICD-10-CM

## 2018-10-29 DIAGNOSIS — M47816 Spondylosis without myelopathy or radiculopathy, lumbar region: Secondary | ICD-10-CM

## 2018-10-29 DIAGNOSIS — E668 Other obesity: Secondary | ICD-10-CM

## 2018-10-29 DIAGNOSIS — E559 Vitamin D deficiency, unspecified: Secondary | ICD-10-CM | POA: Diagnosis not present

## 2018-10-29 DIAGNOSIS — R600 Localized edema: Secondary | ICD-10-CM

## 2018-10-29 LAB — POCT GLYCOSYLATED HEMOGLOBIN (HGB A1C)
Est. average glucose Bld gHb Est-mCnc: 272
Hemoglobin A1C: 11.1 % — AB (ref 4.0–5.6)

## 2018-10-29 MED ORDER — OZEMPIC (0.25 OR 0.5 MG/DOSE) 2 MG/1.5ML ~~LOC~~ SOPN: 0.2500 mg | PEN_INJECTOR | SUBCUTANEOUS | 0 refills | Status: DC

## 2018-10-29 MED ORDER — OZEMPIC (1 MG/DOSE) 2 MG/1.5ML ~~LOC~~ SOPN: 0.2500 mg | PEN_INJECTOR | SUBCUTANEOUS | 4 refills | Status: DC

## 2018-10-29 NOTE — Telephone Encounter (Signed)
[facility] pharmacist called for clarification on Ozempic.  She states with the instructions (" Inject 0.25 mg into skin once a week for first 4 weeks, then 0.5 mg once a week, starting on week 5.")you would have to prescribe Ozempic 0.25 or 0.5mg /dose 2mg /1.29ml pen.    Please resend to [employer].   Thanks,   -Mickel Baas

## 2018-10-29 NOTE — Progress Notes (Signed)
Patient: Carol Ramsey Female    DOB: 1945/12/09   73 y.o.   MRN: [id number] Visit Date: 10/29/2018  Today's Provider: [name], MD   Chief Complaint  Patient presents with  . Diabetes  . Hypertension  . Chronic Kidney Disease   Subjective:    HPI  Diabetes Mellitus Type II, Follow-up:   Lab Results  Component Value Date   HGBA1C 11.1 (A) 10/29/2018   HGBA1C 7.2 (H) 05/10/2018   HGBA1C 7.6 (A) 12/19/2017    Last seen for diabetes 6 months ago.  Management since then includes no changes. She reports good compliance with treatment. She is not having side effects.  Current symptoms include none and have been stable. Home blood sugar records: fasting range: 160s  Episodes of hypoglycemia? no   Most Recent Eye Exam: up to date Weight trend: stable Prior visit with dietician: No Current exercise: walking Current diet habits: well balanced  Pertinent Labs:    Component Value Date/Time   CHOL 114 09/18/2017 1142   TRIG 114 09/18/2017 1142   HDL 50 09/18/2017 1142   LDLCALC 41 09/18/2017 1142   LDLCALC 43 01/26/2017 1222   CREATININE 1.39 (H) 05/10/2018 1153   CREATININE 1.16 (H) 01/26/2017 1222    Allergies  Allergen Reactions  . Gabapentin Swelling  . Quinine Nausea Only and Nausea And Vomiting  . Amoxicillin Itching and Rash    Has patient had a PCN reaction causing immediate rash, facial/tongue/throat swelling, SOB or lightheadedness with hypotension: No Has patient had a PCN reaction causing severe rash involving mucus membranes or skin necrosis: No Has patient had a PCN reaction that required hospitalization No Has patient had a PCN reaction occurring within the last 10 years: No If all of the above answers are "NO", then may proceed with Cephalosporin use.   . Dilaudid  [Hydromorphone Hcl] Rash  . Etodolac Rash  . Hydrochlorothiazide Rash  . Hydrocodone Rash  . Hydromorphone Rash  . Sulfa Antibiotics Rash     Current Outpatient  Medications:  .  acetaminophen (TYLENOL) 500 MG tablet, Take 500 mg by mouth every 6 (six) hours as needed for mild pain or moderate pain., Disp: , Rfl:  .  atorvastatin (LIPITOR) 10 MG tablet, Take 10 mg by mouth daily at 6 PM. , Disp: , Rfl:  .  Cholecalciferol (VITAMIN D) 2000 units CAPS, Take 2,000 Units by mouth daily. , Disp: , Rfl:  .  furosemide (LASIX) 20 MG tablet, TAKE 1 TABLET BY MOUTH EVERY DAY, Disp: 30 tablet, Rfl: 3 .  glipiZIDE (GLUCOTROL XL) 5 MG 24 hr tablet, TAKE 1 TABLET BY MOUTH DAILY, Disp: 90 tablet, Rfl: 3 .  glucose blood (ONETOUCH VERIO) test strip, Use as instructed; Check blood sugar twice daily, Disp: 100 each, Rfl: 12 .  [name] 50-1000 MG tablet, TAKE 1 TABLET BY MOUTH TWICE DAILY WITH A MEAL, Disp: 180 tablet, Rfl: 3 .  lisinopril-hydrochlorothiazide (PRINZIDE,ZESTORETIC) 20-12.5 MG tablet, TAKE 2 TABLETS BY MOUTH EVERY MORNING, Disp: 60 tablet, Rfl: 12 .  metoprolol succinate (TOPROL-XL) 100 MG 24 hr tablet, TAKE 1 TABLET BY MOUTH DAILY WITH OR IMMEDIATELY FOLLOWING A MEAL, Disp: 90 tablet, Rfl: 3 .  pioglitazone (ACTOS) 45 MG tablet, TAKE 1 TABLET(45 MG) BY MOUTH DAILY, Disp: 30 tablet, Rfl: 11 .  tamoxifen (NOLVADEX) 20 MG tablet, TAKE 1 TABLET(20 MG) BY MOUTH DAILY, Disp: 90 tablet, Rfl: 3 .  Calcium Carbonate-Vit D-Min (GNP CALCIUM 1200) 1200-1000 MG-UNIT  CHEW, Chew 1,200 mg by mouth daily with breakfast. Take in combination with vitamin D and magnesium. (Patient not taking: Reported on 09/20/2018), Disp: 30 tablet, Rfl: 5 .  Magnesium 500 MG CAPS, Take 1 capsule (500 mg total) by mouth at bedtime., Disp: 30 capsule, Rfl: 5  Review of Systems  Constitutional: Negative.   HENT: Negative.   Respiratory: Negative.   Cardiovascular: Positive for leg swelling.  Endocrine: Negative.   Genitourinary: Negative.   Musculoskeletal: Positive for arthralgias and back pain.  Allergic/Immunologic: Negative.   Neurological: Negative.   Psychiatric/Behavioral: Negative.    All other systems reviewed and are negative.   Social History   Tobacco Use  . Smoking status: Never Smoker  . Smokeless tobacco: Never Used  Substance Use Topics  . Alcohol use: No      Objective:   BP 126/62   Pulse (!) 58   Temp 98.9 F (37.2 C)   Resp 16   Ht 5\' 3"  (1.6 m)   Wt 240 lb (108.9 kg)   SpO2 100%   BMI 42.51 kg/m  Vitals:   10/29/18 1040  BP: 126/62  Pulse: (!) 58  Resp: 16  Temp: 98.9 F (37.2 C)  SpO2: 100%  Weight: 240 lb (108.9 kg)  Height: 5\' 3"  (1.6 m)     Physical Exam Vitals signs reviewed.  Constitutional:      Appearance: She is well-developed.     Comments: Obese WF NAD.  HENT:     Head: Normocephalic and atraumatic.     Right Ear: External ear normal.     Left Ear: External ear normal.     Nose: Nose normal.  Eyes:     General: No scleral icterus.    Conjunctiva/sclera: Conjunctivae normal.  Neck:     Thyroid: No thyromegaly.  Cardiovascular:     Rate and Rhythm: Normal rate and regular rhythm.     Heart sounds: Normal heart sounds.  Pulmonary:     Effort: Pulmonary effort is normal.     Breath sounds: Normal breath sounds.  Abdominal:     Palpations: Abdomen is soft.  Skin:    General: Skin is warm and dry.  Neurological:     General: No focal deficit present.     Mental Status: She is alert and oriented to person, place, and time. Mental status is at baseline.     Comments: Normal diabetic foot exam.  Psychiatric:        Mood and Affect: Mood normal.        Behavior: Behavior normal.        Thought Content: Thought content normal.        Judgment: Judgment normal.      Results for orders placed or performed in visit on 10/29/18  POCT glycosylated hemoglobin (Hb A1C)  Result Value Ref Range   Hemoglobin A1C 11.1 (A) 4.0 - 5.6 %   HbA1c POC (<> result, manual entry)     HbA1c, POC (prediabetic range)     HbA1c, POC (controlled diabetic range)     Est. average glucose Bld gHb Est-mCnc 272         Assessment & Plan    1. Type 2 diabetes mellitus with complication, without long-term current use of insulin ([place]) After discussion will start Ozempic weekly--hope for some weight loss--insulin would be next. - POCT glycosylated hemoglobin (Hb A1C)--was 7.2--now 11.1 today  2. Type 2 diabetes mellitus without complication, without long-term current use of insulin ([name])  -  Semaglutide, 1 MG/DOSE, (OZEMPIC, 1 MG/DOSE,) 2 MG/1.5ML SOPN; Inject 0.25 mg into the skin once a week. Inject 0.25 mg into skin once a week for first 4 weeks, then 0.5 mg once a week, starting on week 5.  Dispense: 1 pen; Refill: 4  3. CKD (chronic kidney disease) stage 3, GFR 30-59 ml/min ([name])   4. Primary osteoarthritis of left knee Per Dr Marry Guan.  5. Vitamin D insufficiency   6. Lumbar spondylosis   7. Lumbar facet arthropathy (Bilateral)   8. Extreme obesity   9. Ductal carcinoma in situ (DCIS) of right breast   10. Bilateral leg edema Has seen wound clinic--vascular benefit?  11. Lymphedema     I have done the exam and reviewed the above chart and it is accurate to the best of my knowledge. Development worker, community has been used in this note in any air is in the dictation or transcription are unintentional.  [name], MD  [place]

## 2018-10-30 DIAGNOSIS — M25561 Pain in right knee: Secondary | ICD-10-CM | POA: Diagnosis not present

## 2018-10-30 DIAGNOSIS — M1710 Unilateral primary osteoarthritis, unspecified knee: Secondary | ICD-10-CM | POA: Insufficient documentation

## 2018-10-30 DIAGNOSIS — M1711 Unilateral primary osteoarthritis, right knee: Secondary | ICD-10-CM | POA: Diagnosis not present

## 2018-10-30 DIAGNOSIS — M25761 Osteophyte, right knee: Secondary | ICD-10-CM | POA: Diagnosis not present

## 2018-10-30 DIAGNOSIS — I890 Lymphedema, not elsewhere classified: Secondary | ICD-10-CM | POA: Insufficient documentation

## 2018-10-30 DIAGNOSIS — M179 Osteoarthritis of knee, unspecified: Secondary | ICD-10-CM | POA: Insufficient documentation

## 2018-11-06 DIAGNOSIS — M25561 Pain in right knee: Secondary | ICD-10-CM | POA: Diagnosis not present

## 2018-11-06 DIAGNOSIS — M1711 Unilateral primary osteoarthritis, right knee: Secondary | ICD-10-CM | POA: Diagnosis not present

## 2018-11-06 DIAGNOSIS — M25761 Osteophyte, right knee: Secondary | ICD-10-CM | POA: Diagnosis not present

## 2018-11-13 DIAGNOSIS — M1711 Unilateral primary osteoarthritis, right knee: Secondary | ICD-10-CM | POA: Diagnosis not present

## 2018-11-13 DIAGNOSIS — M25761 Osteophyte, right knee: Secondary | ICD-10-CM | POA: Diagnosis not present

## 2018-11-13 DIAGNOSIS — M25561 Pain in right knee: Secondary | ICD-10-CM | POA: Diagnosis not present

## 2018-11-13 DIAGNOSIS — M25562 Pain in left knee: Secondary | ICD-10-CM | POA: Diagnosis not present

## 2018-11-20 DIAGNOSIS — R2689 Other abnormalities of gait and mobility: Secondary | ICD-10-CM | POA: Diagnosis not present

## 2018-11-20 DIAGNOSIS — M25761 Osteophyte, right knee: Secondary | ICD-10-CM | POA: Diagnosis not present

## 2018-11-20 DIAGNOSIS — M25511 Pain in right shoulder: Secondary | ICD-10-CM | POA: Diagnosis not present

## 2018-11-20 DIAGNOSIS — M1711 Unilateral primary osteoarthritis, right knee: Secondary | ICD-10-CM | POA: Diagnosis not present

## 2018-11-20 DIAGNOSIS — M222X1 Patellofemoral disorders, right knee: Secondary | ICD-10-CM | POA: Diagnosis not present

## 2018-11-21 ENCOUNTER — Other Ambulatory Visit: Payer: Self-pay

## 2018-11-21 ENCOUNTER — Ambulatory Visit
Admission: RE | Admit: 2018-11-21 | Discharge: 2018-11-21 | Disposition: A | Discharge status: Home / Self Care | Payer: Medicare Other | Source: Ambulatory Visit | Attending: Radiation Oncology | Admitting: Radiation Oncology

## 2018-11-21 ENCOUNTER — Encounter: Payer: Self-pay | Admitting: Radiation Oncology

## 2018-11-21 VITALS — BP 143/38 | HR 65 | Temp 98.6°F | Resp 20

## 2018-11-21 DIAGNOSIS — Z923 Personal history of irradiation: Secondary | ICD-10-CM | POA: Diagnosis not present

## 2018-11-21 DIAGNOSIS — M129 Arthropathy, unspecified: Secondary | ICD-10-CM | POA: Diagnosis not present

## 2018-11-21 DIAGNOSIS — Z170 Estrogen receptor positive status [ER+]: Secondary | ICD-10-CM | POA: Insufficient documentation

## 2018-11-21 DIAGNOSIS — Z79810 Long term (current) use of selective estrogen receptor modulators (SERMs): Secondary | ICD-10-CM | POA: Diagnosis not present

## 2018-11-21 DIAGNOSIS — D0511 Intraductal carcinoma in situ of right breast: Secondary | ICD-10-CM | POA: Insufficient documentation

## 2018-11-21 NOTE — Progress Notes (Signed)
Radiation Oncology Follow up Note  Name: Carol Ramsey   Date:   11/21/2018 MRN:  [id number] DOB: 09-15-1945    This 73 y.o. female presents to the clinic today for 2-year follow-up status post whole breast radiation to her right breast for ER PR positive ductal carcinoma in situ.  REFERRING PROVIDER: Jerrol Banana.,*  HPI: Patient is a 73 year old female now about 2 years having completed whole breast radiation to her right breast for ER PR positive ductal carcinoma in situ.  Seen today in routine follow-up she is doing well from a breast standpoint she has multiple problems with arthritis back pain and knee pain from presumed arthritis.  She specifically denies breast tenderness cough or any new masses or nodularity on self-examination..  She is currently on tamoxifen tolerating that well.  Her last mammogram which I had reviewed was back in February was a BI-RADS 2 benign  COMPLICATIONS OF TREATMENT: none  FOLLOW UP COMPLIANCE: keeps appointments   PHYSICAL EXAM:  BP (!) 143/38   Pulse 65   Temp 98.6 F (37 C)   Resp 20  Lungs are clear to A&P cardiac examination essentially unremarkable with regular rate and rhythm. No dominant mass or nodularity is noted in either breast in 2 positions examined. Incision is well-healed. No axillary or supraclavicular adenopathy is appreciated. Cosmetic result is excellent.  Well-developed well-nourished patient in NAD. HEENT reveals PERLA, EOMI, discs not visualized.  Oral cavity is clear. No oral mucosal lesions are identified. Neck is clear without evidence of cervical or supraclavicular adenopathy. Lungs are clear to A&P. Cardiac examination is essentially unremarkable with regular rate and rhythm without murmur rub or thrill. Abdomen is benign with no organomegaly or masses noted. Motor sensory and DTR levels are equal and symmetric in the upper and lower extremities. Cranial nerves II through XII are grossly intact. Proprioception is intact.  No peripheral adenopathy or edema is identified. No motor or sensory levels are noted. Crude visual fields are within normal range.  RADIOLOGY RESULTS: Mammograms reviewed compatible with above-stated findings showing a BI-RADS 2 benign breast  PLAN: Present time from a breast endpoint she continues to do well with no evidence of disease.  I am pleased with her overall progress.  I have asked to see her back in 1 year for follow-up.  She continues on tamoxifen without side effect.  Patient knows to call with any concerns at any time.  I would like to take this opportunity to thank you for allowing me to participate in the care of your patient.Noreene Filbert, MD

## 2018-11-24 ENCOUNTER — Other Ambulatory Visit: Payer: Self-pay | Admitting: Family Medicine

## 2018-11-30 IMAGING — CR DG SI JOINTS 3+V
1 series · 3 of 3 positions shown · non-contrast
Comparison: Lumbar spine films same date dictated separately.

CLINICAL DATA: 72-year-old female with chronic back pain for
several years. Initial encounter.

EXAM:
BILATERAL SACROILIAC JOINTS - 3+ VIEW

[Series 1: dg si joints · 0.14mm/px · 3 of 3 slices shown]
[im 1/3]
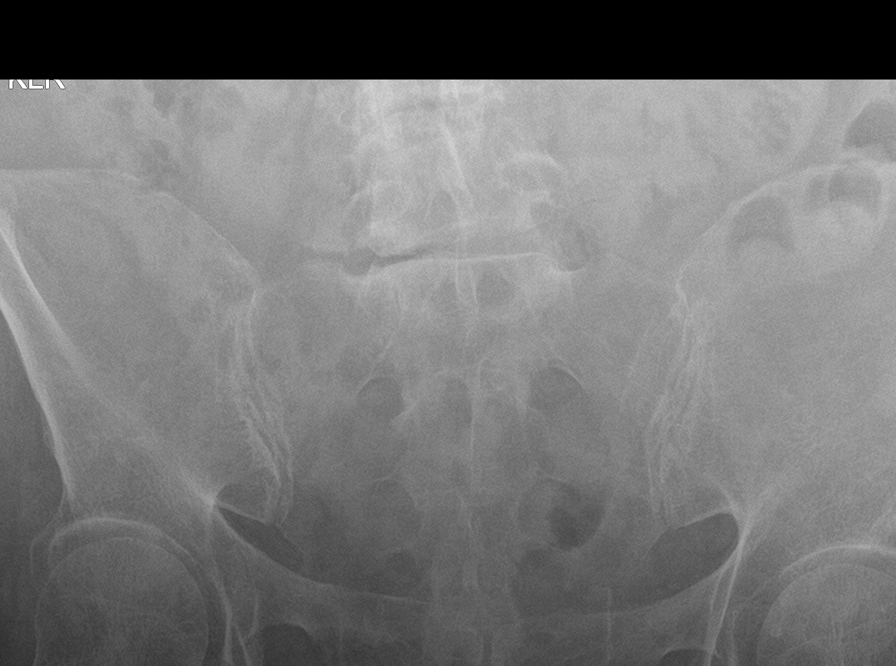
[im 2/3]
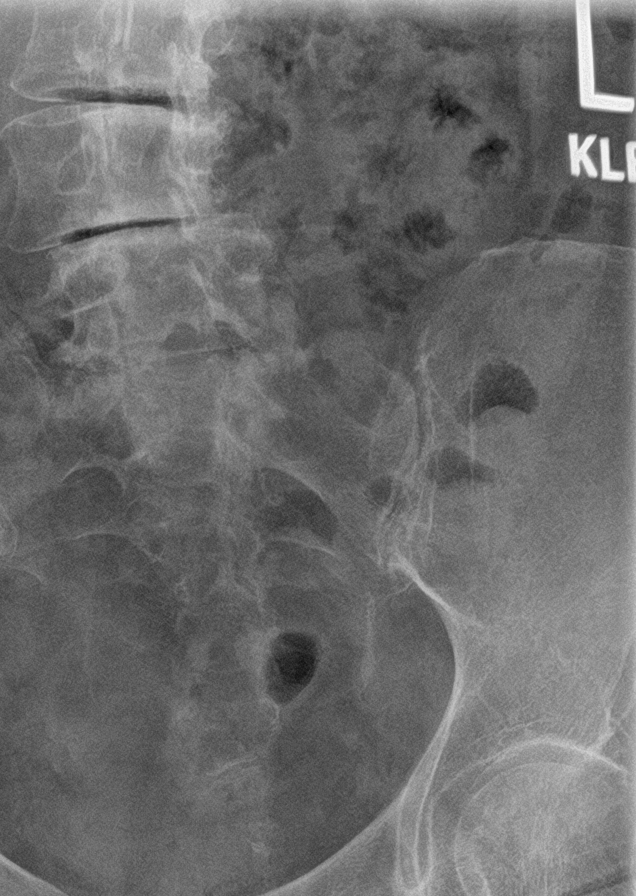
[im 3/3]
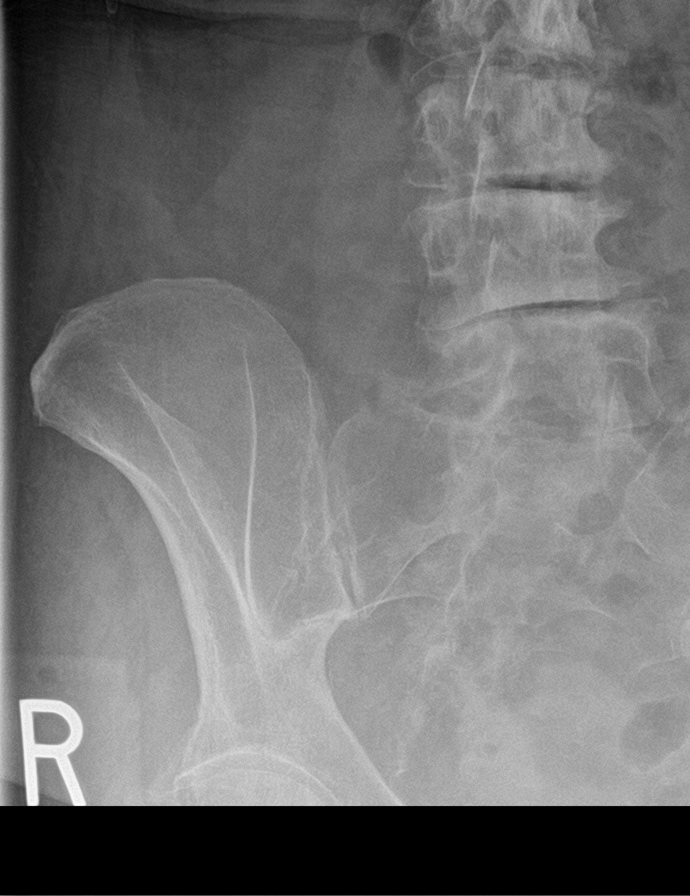

[3 of 3 positions shown; findings below may reference images not displayed]

FINDINGS: Minimal left-sided sacroiliac joint degenerative changes. Right
sacroiliac joint is intact. Degenerative changes lumbar spine as
detailed on lumbar spine exam, dictated separately.
IMPRESSION: Minimal left-sided sacroiliac joint degenerative changes.

## 2018-12-06 DIAGNOSIS — M545 Low back pain: Secondary | ICD-10-CM | POA: Diagnosis not present

## 2018-12-06 DIAGNOSIS — M25761 Osteophyte, right knee: Secondary | ICD-10-CM | POA: Diagnosis not present

## 2018-12-06 DIAGNOSIS — M222X1 Patellofemoral disorders, right knee: Secondary | ICD-10-CM | POA: Diagnosis not present

## 2018-12-06 DIAGNOSIS — M25511 Pain in right shoulder: Secondary | ICD-10-CM | POA: Diagnosis not present

## 2018-12-06 DIAGNOSIS — M25561 Pain in right knee: Secondary | ICD-10-CM | POA: Diagnosis not present

## 2018-12-06 DIAGNOSIS — M1711 Unilateral primary osteoarthritis, right knee: Secondary | ICD-10-CM | POA: Diagnosis not present

## 2018-12-06 DIAGNOSIS — R2689 Other abnormalities of gait and mobility: Secondary | ICD-10-CM | POA: Diagnosis not present

## 2018-12-07 ENCOUNTER — Telehealth: Payer: Self-pay | Admitting: Family Medicine

## 2018-12-07 NOTE — Telephone Encounter (Signed)
FYI

## 2018-12-07 NOTE — Telephone Encounter (Signed)
Pt called to let Dr. Rosanna Randy know after taking 2 days of OZEMPIC, 0.25 OR 0.5 MG/DOSE, 2 MG/1.5ML SOPN pt feeling nauseous, fatigued and having acid reflux daily.    Pt has a whole pen left but not sure if she wants to use it if this is going to continue.   Pt will se Dr. Rosanna Randy in Sept. To discuss.  Changed eating and having good numbers now.  If you have any questions please call pt back at [phone].  Thanks, [employer]

## 2018-12-12 DIAGNOSIS — M62830 Muscle spasm of back: Secondary | ICD-10-CM | POA: Diagnosis not present

## 2018-12-12 DIAGNOSIS — M545 Low back pain: Secondary | ICD-10-CM | POA: Diagnosis not present

## 2018-12-13 NOTE — Telephone Encounter (Signed)
Now the higher of 2 doses--the lower is the starting dose.

## 2018-12-19 DIAGNOSIS — M62830 Muscle spasm of back: Secondary | ICD-10-CM | POA: Diagnosis not present

## 2018-12-19 DIAGNOSIS — M545 Low back pain: Secondary | ICD-10-CM | POA: Diagnosis not present

## 2018-12-27 DIAGNOSIS — H0102B Squamous blepharitis left eye, upper and lower eyelids: Secondary | ICD-10-CM | POA: Diagnosis not present

## 2018-12-27 DIAGNOSIS — E113293 Type 2 diabetes mellitus with mild nonproliferative diabetic retinopathy without macular edema, bilateral: Secondary | ICD-10-CM | POA: Diagnosis not present

## 2018-12-27 DIAGNOSIS — H0102A Squamous blepharitis right eye, upper and lower eyelids: Secondary | ICD-10-CM | POA: Diagnosis not present

## 2018-12-27 DIAGNOSIS — H524 Presbyopia: Secondary | ICD-10-CM | POA: Diagnosis not present

## 2018-12-27 DIAGNOSIS — H2513 Age-related nuclear cataract, bilateral: Secondary | ICD-10-CM | POA: Diagnosis not present

## 2019-01-09 DIAGNOSIS — I83893 Varicose veins of bilateral lower extremities with other complications: Secondary | ICD-10-CM | POA: Diagnosis not present

## 2019-01-17 DIAGNOSIS — D2272 Melanocytic nevi of left lower limb, including hip: Secondary | ICD-10-CM | POA: Diagnosis not present

## 2019-01-17 DIAGNOSIS — L570 Actinic keratosis: Secondary | ICD-10-CM | POA: Diagnosis not present

## 2019-01-17 DIAGNOSIS — X32XXXA Exposure to sunlight, initial encounter: Secondary | ICD-10-CM | POA: Diagnosis not present

## 2019-01-17 DIAGNOSIS — Z85828 Personal history of other malignant neoplasm of skin: Secondary | ICD-10-CM | POA: Diagnosis not present

## 2019-01-17 DIAGNOSIS — C4442 Squamous cell carcinoma of skin of scalp and neck: Secondary | ICD-10-CM | POA: Diagnosis not present

## 2019-01-17 DIAGNOSIS — D2261 Melanocytic nevi of right upper limb, including shoulder: Secondary | ICD-10-CM | POA: Diagnosis not present

## 2019-01-17 DIAGNOSIS — D044 Carcinoma in situ of skin of scalp and neck: Secondary | ICD-10-CM | POA: Diagnosis not present

## 2019-01-17 DIAGNOSIS — D225 Melanocytic nevi of trunk: Secondary | ICD-10-CM | POA: Diagnosis not present

## 2019-01-17 DIAGNOSIS — D2262 Melanocytic nevi of left upper limb, including shoulder: Secondary | ICD-10-CM | POA: Diagnosis not present

## 2019-01-17 DIAGNOSIS — L821 Other seborrheic keratosis: Secondary | ICD-10-CM | POA: Diagnosis not present

## 2019-01-17 DIAGNOSIS — R208 Other disturbances of skin sensation: Secondary | ICD-10-CM | POA: Diagnosis not present

## 2019-01-17 DIAGNOSIS — D485 Neoplasm of uncertain behavior of skin: Secondary | ICD-10-CM | POA: Diagnosis not present

## 2019-01-17 DIAGNOSIS — D2271 Melanocytic nevi of right lower limb, including hip: Secondary | ICD-10-CM | POA: Diagnosis not present

## 2019-01-17 DIAGNOSIS — Z08 Encounter for follow-up examination after completed treatment for malignant neoplasm: Secondary | ICD-10-CM | POA: Diagnosis not present

## 2019-01-25 DIAGNOSIS — H0102A Squamous blepharitis right eye, upper and lower eyelids: Secondary | ICD-10-CM | POA: Diagnosis not present

## 2019-01-25 DIAGNOSIS — H04123 Dry eye syndrome of bilateral lacrimal glands: Secondary | ICD-10-CM | POA: Diagnosis not present

## 2019-01-25 DIAGNOSIS — H0102B Squamous blepharitis left eye, upper and lower eyelids: Secondary | ICD-10-CM | POA: Diagnosis not present

## 2019-01-28 NOTE — Progress Notes (Signed)
Patient: Carol Ramsey Female    DOB: Mar 10, 1946   73 y.o.   MRN: [id number] Visit Date: 01/29/2019  Today's Provider: [name], MD   Chief Complaint  Patient presents with  . Diabetes   Subjective:   HPI  Diabetes Mellitus Type II, Follow-up:   Lab Results  Component Value Date   HGBA1C 8.3 (A) 01/29/2019   HGBA1C 11.1 (A) 10/29/2018   HGBA1C 7.2 (H) 05/10/2018    Last seen for diabetes 3 months ago.  Management since then includes starting Ozempic injection once weekly. She reports good compliance with treatment. She is having side effects. Patient reports that she had nausea and GERD symptoms after starting Ozempic. She reports that she took it for about 6 weeks.    Home blood sugar records: fasting range: 130s  Episodes of hypoglycemia? no   Most Recent Eye Exam: up to date Weight trend: stable Prior visit with dietician: No Current exercise: no regular exercise Current diet habits: well balanced  Pertinent Labs:    Component Value Date/Time   CHOL 114 09/18/2017 1142   TRIG 114 09/18/2017 1142   HDL 50 09/18/2017 1142   LDLCALC 41 09/18/2017 1142   LDLCALC 43 01/26/2017 1222   CREATININE 1.39 (H) 05/10/2018 1153   CREATININE 1.16 (H) 01/26/2017 1222    Wt Readings from Last 3 Encounters:  01/29/19 247 lb (112 kg)  10/29/18 240 lb (108.9 kg)  06/12/18 253 lb (114.8 kg)     Allergies  Allergen Reactions  . Gabapentin Swelling  . Quinine Nausea Only and Nausea And Vomiting  . Amoxicillin Itching and Rash    Has patient had a PCN reaction causing immediate rash, facial/tongue/throat swelling, SOB or lightheadedness with hypotension: No Has patient had a PCN reaction causing severe rash involving mucus membranes or skin necrosis: No Has patient had a PCN reaction that required hospitalization No Has patient had a PCN reaction occurring within the last 10 years: No If all of the above answers are "NO", then may proceed with  Cephalosporin use.   . Dilaudid  [Hydromorphone Hcl] Rash  . Etodolac Rash  . Hydrochlorothiazide Rash  . Hydrocodone Rash  . Hydromorphone Rash  . Sulfa Antibiotics Rash     Current Outpatient Medications:  .  acetaminophen (TYLENOL) 500 MG tablet, Take 500 mg by mouth every 6 (six) hours as needed for mild pain or moderate pain., Disp: , Rfl:  .  atorvastatin (LIPITOR) 10 MG tablet, Take 10 mg by mouth daily at 6 PM. , Disp: , Rfl:  .  Cholecalciferol (VITAMIN D) 2000 units CAPS, Take 2,000 Units by mouth daily. , Disp: , Rfl:  .  furosemide (LASIX) 20 MG tablet, TAKE 1 TABLET BY MOUTH EVERY DAY, Disp: 30 tablet, Rfl: 3 .  glipiZIDE (GLUCOTROL XL) 5 MG 24 hr tablet, TAKE 1 TABLET BY MOUTH DAILY, Disp: 90 tablet, Rfl: 3 .  glucose blood (ONETOUCH VERIO) test strip, Use as instructed; Check blood sugar twice daily, Disp: 100 each, Rfl: 12 .  [name] 50-1000 MG tablet, TAKE 1 TABLET BY MOUTH TWICE DAILY WITH A MEAL, Disp: 180 tablet, Rfl: 3 .  lisinopril-hydrochlorothiazide (PRINZIDE,ZESTORETIC) 20-12.5 MG tablet, TAKE 2 TABLETS BY MOUTH EVERY MORNING, Disp: 60 tablet, Rfl: 12 .  metoprolol succinate (TOPROL-XL) 100 MG 24 hr tablet, TAKE 1 TABLET BY MOUTH DAILY WITH OR IMMEDIATELY FOLLOWING A MEAL, Disp: 90 tablet, Rfl: 3 .  pioglitazone (ACTOS) 45 MG tablet, TAKE 1  TABLET(45 MG) BY MOUTH DAILY, Disp: 30 tablet, Rfl: 11 .  Semaglutide, 1 MG/DOSE, (OZEMPIC, 1 MG/DOSE,) 2 MG/1.5ML SOPN, Inject 0.25 mg into the skin once a week. Inject 0.25 mg into skin once a week for first 4 weeks, then 0.5 mg once a week, starting on week 5., Disp: 1 pen, Rfl: 4 .  tamoxifen (NOLVADEX) 20 MG tablet, TAKE 1 TABLET(20 MG) BY MOUTH DAILY, Disp: 90 tablet, Rfl: 3 .  Calcium Carbonate-Vit D-Min (GNP CALCIUM 1200) 1200-1000 MG-UNIT CHEW, Chew 1,200 mg by mouth daily with breakfast. Take in combination with vitamin D and magnesium. (Patient not taking: Reported on 09/20/2018), Disp: 30 tablet, Rfl: 5 .  Magnesium 500  MG CAPS, Take 1 capsule (500 mg total) by mouth at bedtime., Disp: 30 capsule, Rfl: 5 .  OZEMPIC, 0.25 OR 0.5 MG/DOSE, 2 MG/1.5ML SOPN, INJECT 0.25 MG UNDER THE SKIN ONCE WEEKLY FOR 4 WEEKS, THEN 0.5 MG UNDER THE SKIN ONCE WEEKLY STARTING ON WEEK 5, Disp: 1.5 mL, Rfl: 5  Review of Systems  Constitutional: Negative for activity change and fatigue.  HENT: Negative.   Eyes: Negative.   Respiratory: Negative for cough and shortness of breath.   Cardiovascular: Negative for chest pain, palpitations and leg swelling.  Gastrointestinal: Negative.   Endocrine: Negative for cold intolerance, heat intolerance, polydipsia, polyphagia and polyuria.  Allergic/Immunologic: Negative.   Neurological: Negative for dizziness, light-headedness and headaches.  Hematological: Negative.   Psychiatric/Behavioral: Negative for agitation, self-injury, sleep disturbance and suicidal ideas. The patient is not nervous/anxious.     Social History   Tobacco Use  . Smoking status: Never Smoker  . Smokeless tobacco: Never Used  Substance Use Topics  . Alcohol use: No      Objective:   BP 128/68   Pulse (!) 52   Temp 98.8 F (37.1 C)   Resp 16   Ht 5\' 3"  (1.6 m)   Wt 247 lb (112 kg)   SpO2 99%   BMI 43.75 kg/m  Vitals:   01/29/19 1052  BP: 128/68  Pulse: (!) 52  Resp: 16  Temp: 98.8 F (37.1 C)  SpO2: 99%  Weight: 247 lb (112 kg)  Height: 5\' 3"  (1.6 m)  Body mass index is 43.75 kg/m.   Physical Exam Vitals signs reviewed.  Constitutional:      Appearance: She is well-developed. She is obese.     Comments: Obese WF NAD.  HENT:     Head: Normocephalic and atraumatic.     Right Ear: External ear normal.     Left Ear: External ear normal.     Nose: Nose normal.  Eyes:     General: No scleral icterus.    Conjunctiva/sclera: Conjunctivae normal.  Neck:     Thyroid: No thyromegaly.  Cardiovascular:     Rate and Rhythm: Normal rate and regular rhythm.     Heart sounds: Normal heart  sounds.  Pulmonary:     Effort: Pulmonary effort is normal.     Breath sounds: Normal breath sounds.  Abdominal:     Palpations: Abdomen is soft.  Skin:    General: Skin is warm and dry.     Comments: Very fair skin.  Neurological:     Mental Status: She is alert and oriented to person, place, and time. Mental status is at baseline.  Psychiatric:        Mood and Affect: Mood normal.        Behavior: Behavior normal.  Thought Content: Thought content normal.        Judgment: Judgment normal.      Results for orders placed or performed in visit on 01/29/19  POCT glycosylated hemoglobin (Hb A1C)  Result Value Ref Range   Hemoglobin A1C 8.3 (A) 4.0 - 5.6 %   HbA1c POC (<> result, manual entry)     HbA1c, POC (prediabetic range)     HbA1c, POC (controlled diabetic range)     Est. average glucose Bld gHb Est-mCnc 192        Assessment & Plan    1. Type 2 diabetes mellitus with complication, without long-term current use of insulin ([name]) Much improved--goal less than 7.5 - POCT glycosylated hemoglobin (Hb A1C)--8.3 today  2. Need for influenza vaccination  - Flu Vaccine QUAD High Dose(Fluad)  3. Type 2 diabetes mellitus without complication, without long-term current use of insulin ([name])  - CBC w/Diff/Platelet  4. Morbid obesity ([place]) Major health and quality of life issues.  5. Acquired hypothyroidism  - TSH  6. Essential (primary) hypertension Controlled. - CBC w/Diff/Platelet - TSH - Comp. Metabolic Panel (12) - Lipid Profile  7. Pure hypercholesterolemia Lipitor. - Comp. Metabolic Panel (12) - Lipid Profile     [name], MD  [facility]

## 2019-01-29 ENCOUNTER — Encounter: Payer: Self-pay | Admitting: Family Medicine

## 2019-01-29 ENCOUNTER — Ambulatory Visit (INDEPENDENT_AMBULATORY_CARE_PROVIDER_SITE_OTHER): Payer: Medicare Other | Admitting: Family Medicine

## 2019-01-29 ENCOUNTER — Other Ambulatory Visit: Payer: Self-pay

## 2019-01-29 VITALS — BP 128/68 | HR 52 | Temp 98.8°F | Resp 16 | Ht 63.0 in | Wt 247.0 lb

## 2019-01-29 DIAGNOSIS — I10 Essential (primary) hypertension: Secondary | ICD-10-CM | POA: Diagnosis not present

## 2019-01-29 DIAGNOSIS — E119 Type 2 diabetes mellitus without complications: Secondary | ICD-10-CM

## 2019-01-29 DIAGNOSIS — E118 Type 2 diabetes mellitus with unspecified complications: Secondary | ICD-10-CM

## 2019-01-29 DIAGNOSIS — E039 Hypothyroidism, unspecified: Secondary | ICD-10-CM

## 2019-01-29 DIAGNOSIS — Z23 Encounter for immunization: Secondary | ICD-10-CM

## 2019-01-29 DIAGNOSIS — E7800 Pure hypercholesterolemia, unspecified: Secondary | ICD-10-CM | POA: Diagnosis not present

## 2019-01-29 LAB — POCT GLYCOSYLATED HEMOGLOBIN (HGB A1C)
Est. average glucose Bld gHb Est-mCnc: 192
Hemoglobin A1C: 8.3 % — AB (ref 4.0–5.6)

## 2019-01-30 LAB — CBC WITH DIFFERENTIAL/PLATELET
Basophils Absolute: 0 10*3/uL (ref 0.0–0.2)
Basos: 1 %
EOS (ABSOLUTE): 0.2 10*3/uL (ref 0.0–0.4)
Eos: 3 %
Hematocrit: 33.7 % — ABNORMAL LOW (ref 34.0–46.6)
Hemoglobin: 10.4 g/dL — ABNORMAL LOW (ref 11.1–15.9)
Immature Grans (Abs): 0 10*3/uL (ref 0.0–0.1)
Immature Granulocytes: 1 %
Lymphocytes Absolute: 1.7 10*3/uL (ref 0.7–3.1)
Lymphs: 26 %
MCH: 27.7 pg (ref 26.6–33.0)
MCHC: 30.9 g/dL — ABNORMAL LOW (ref 31.5–35.7)
MCV: 90 fL (ref 79–97)
Monocytes Absolute: 0.6 10*3/uL (ref 0.1–0.9)
Monocytes: 10 %
Neutrophils Absolute: 3.9 10*3/uL (ref 1.4–7.0)
Neutrophils: 59 %
Platelets: 199 10*3/uL (ref 150–450)
RBC: 3.76 x10E6/uL — ABNORMAL LOW (ref 3.77–5.28)
RDW: 13.6 % (ref 11.7–15.4)
WBC: 6.5 10*3/uL (ref 3.4–10.8)

## 2019-01-30 LAB — LIPID PANEL
Chol/HDL Ratio: 2.2 ratio (ref 0.0–4.4)
Cholesterol, Total: 102 mg/dL (ref 100–199)
HDL: 47 mg/dL (ref >39)
LDL Chol Calc (NIH): 33 mg/dL (ref 0–99)
Triglycerides: 126 mg/dL (ref 0–149)
VLDL Cholesterol Cal: 22 mg/dL (ref 5–40)

## 2019-01-30 LAB — COMP. METABOLIC PANEL (12)
AST: 15 IU/L (ref 0–40)
Albumin/Globulin Ratio: 1.4 (ref 1.2–2.2)
Albumin: 3.6 g/dL — ABNORMAL LOW (ref 3.7–4.7)
Alkaline Phosphatase: 30 IU/L — ABNORMAL LOW (ref 39–117)
BUN/Creatinine Ratio: 23 (ref 12–28)
BUN: 32 mg/dL — ABNORMAL HIGH (ref 8–27)
Bilirubin Total: 0.5 mg/dL (ref 0.0–1.2)
Calcium: 9.4 mg/dL (ref 8.7–10.3)
Chloride: 104 mmol/L (ref 96–106)
Creatinine, Ser: 1.41 mg/dL — ABNORMAL HIGH (ref 0.57–1.00)
GFR calc Af Amer: 43 mL/min/{1.73_m2} — ABNORMAL LOW (ref >59)
GFR calc non Af Amer: 37 mL/min/{1.73_m2} — ABNORMAL LOW (ref >59)
Globulin, Total: 2.6 g/dL (ref 1.5–4.5)
Glucose: 132 mg/dL — ABNORMAL HIGH (ref 65–99)
Potassium: 4.3 mmol/L (ref 3.5–5.2)
Sodium: 138 mmol/L (ref 134–144)
Total Protein: 6.2 g/dL (ref 6.0–8.5)

## 2019-01-30 LAB — TSH: TSH: 3.36 u[IU]/mL (ref 0.450–4.500)

## 2019-02-06 ENCOUNTER — Telehealth: Payer: Self-pay | Admitting: Family Medicine

## 2019-02-06 NOTE — Telephone Encounter (Signed)
Advised patient of lab results from 9/29.

## 2019-02-06 NOTE — Telephone Encounter (Signed)
Pt needing lab results asap.  Thanks, [employer]

## 2019-02-07 DIAGNOSIS — M1711 Unilateral primary osteoarthritis, right knee: Secondary | ICD-10-CM | POA: Diagnosis not present

## 2019-02-07 DIAGNOSIS — M25511 Pain in right shoulder: Secondary | ICD-10-CM | POA: Diagnosis not present

## 2019-02-07 DIAGNOSIS — R2689 Other abnormalities of gait and mobility: Secondary | ICD-10-CM | POA: Diagnosis not present

## 2019-02-07 DIAGNOSIS — M222X1 Patellofemoral disorders, right knee: Secondary | ICD-10-CM | POA: Diagnosis not present

## 2019-02-07 DIAGNOSIS — M25761 Osteophyte, right knee: Secondary | ICD-10-CM | POA: Diagnosis not present

## 2019-02-20 DIAGNOSIS — M62830 Muscle spasm of back: Secondary | ICD-10-CM | POA: Diagnosis not present

## 2019-02-22 DIAGNOSIS — D044 Carcinoma in situ of skin of scalp and neck: Secondary | ICD-10-CM | POA: Diagnosis not present

## 2019-02-26 DIAGNOSIS — C4442 Squamous cell carcinoma of skin of scalp and neck: Secondary | ICD-10-CM | POA: Diagnosis not present

## 2019-03-11 ENCOUNTER — Other Ambulatory Visit: Payer: Self-pay | Admitting: Family Medicine

## 2019-04-08 ENCOUNTER — Other Ambulatory Visit: Payer: Self-pay | Admitting: Family Medicine

## 2019-04-08 DIAGNOSIS — E118 Type 2 diabetes mellitus with unspecified complications: Secondary | ICD-10-CM

## 2019-04-08 MED ORDER — JANUMET 50-1000 MG PO TABS: ORAL_TABLET | ORAL | 3 refills | Status: DC

## 2019-04-08 NOTE — Telephone Encounter (Signed)
[facility] faxed refill request for the following medications:  [name] 50-1000 MG tablet   Please advise.  Thanks, [employer]

## 2019-04-12 IMAGING — MG DIGITAL DIAGNOSTIC BILATERAL MAMMOGRAM WITH TOMO AND CAD
8 of 15 series · 8 of 40 positions shown · non-contrast
Comparison: Previous exam(s).

CLINICAL DATA: 72-year-old female presenting for routine annual
surveillance status post right breast lumpectomy in 8926.

EXAM:
DIGITAL DIAGNOSTIC BILATERAL MAMMOGRAM WITH CAD AND TOMO

[R MLO]
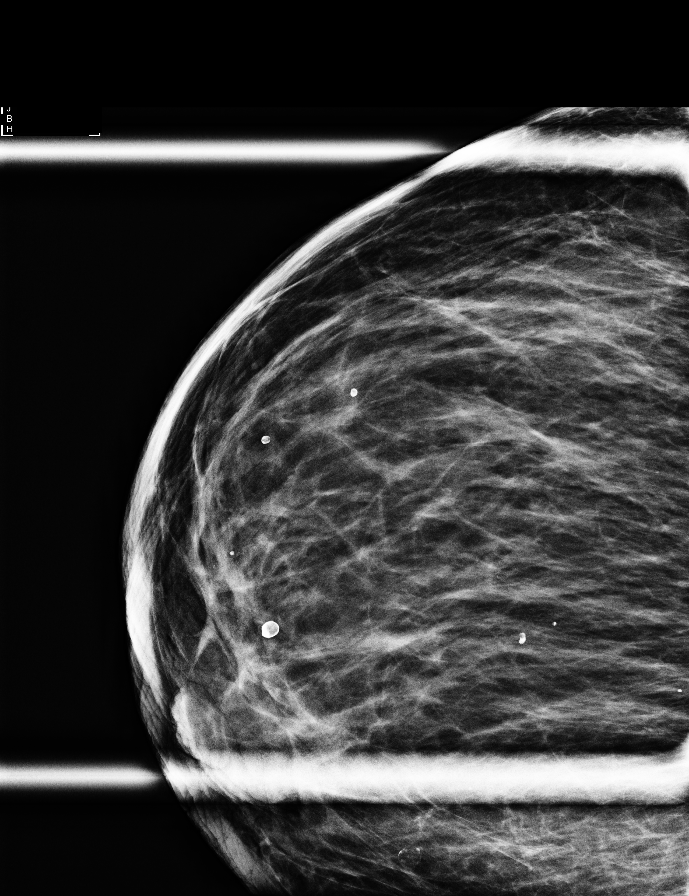

[L MLO synth-2D (1 of 2)]
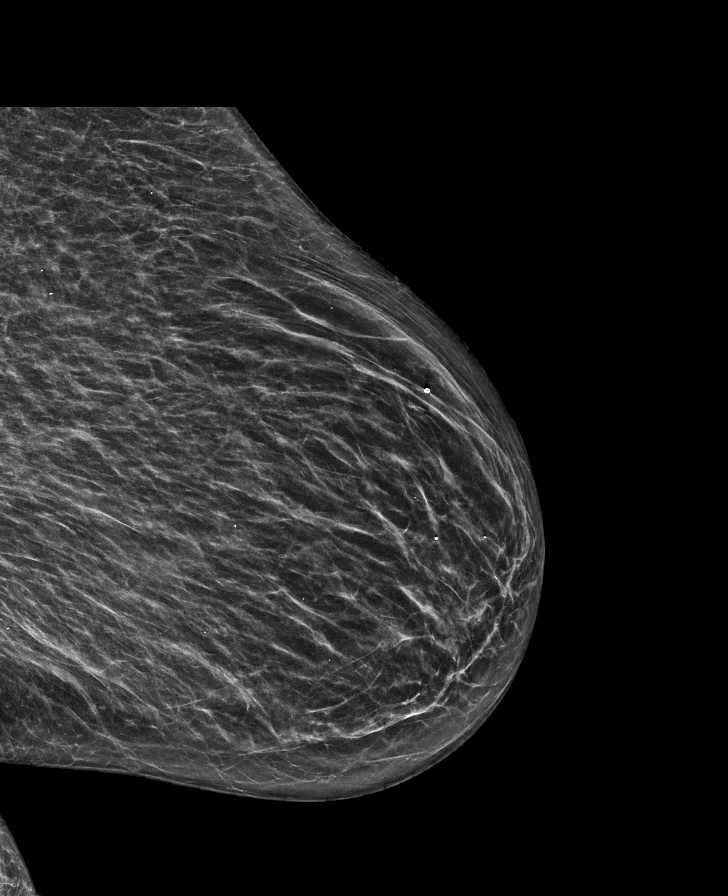

[L CC synth-2D (1 of 2)]
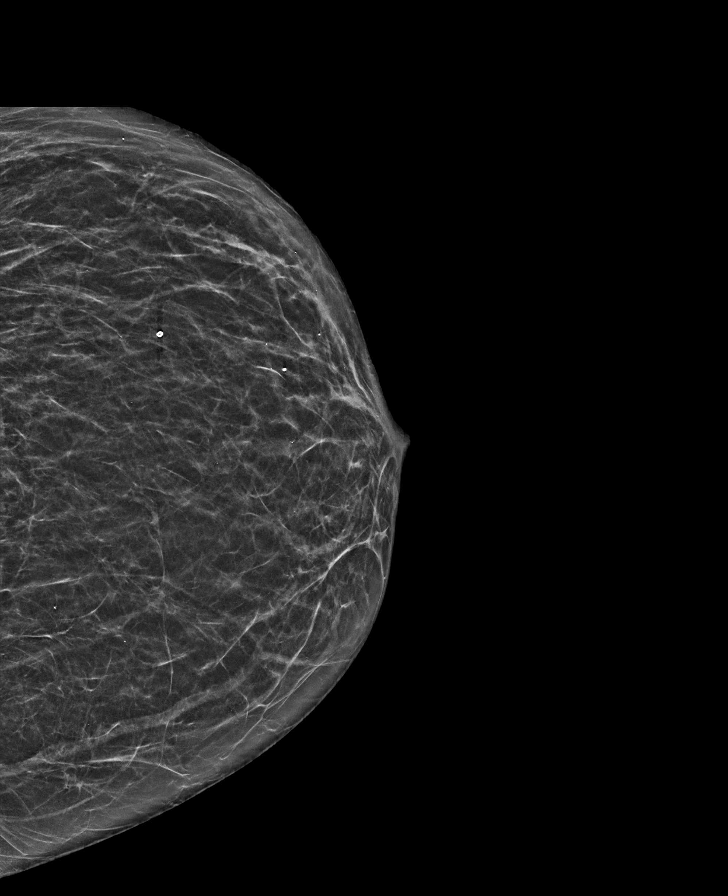

[R CC synth-2D]
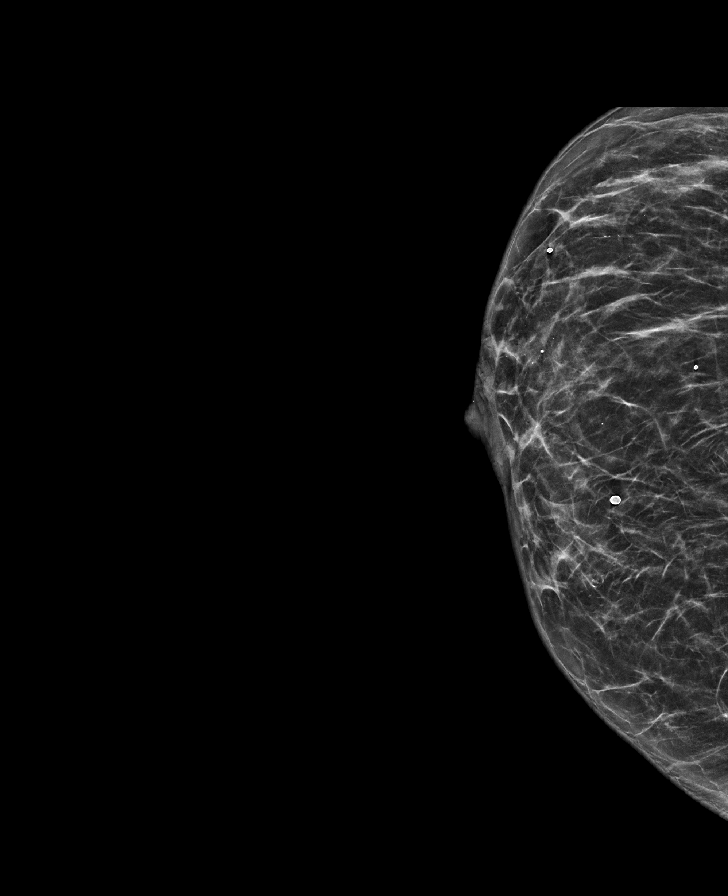

[L MLO synth-2D (2 of 2)]
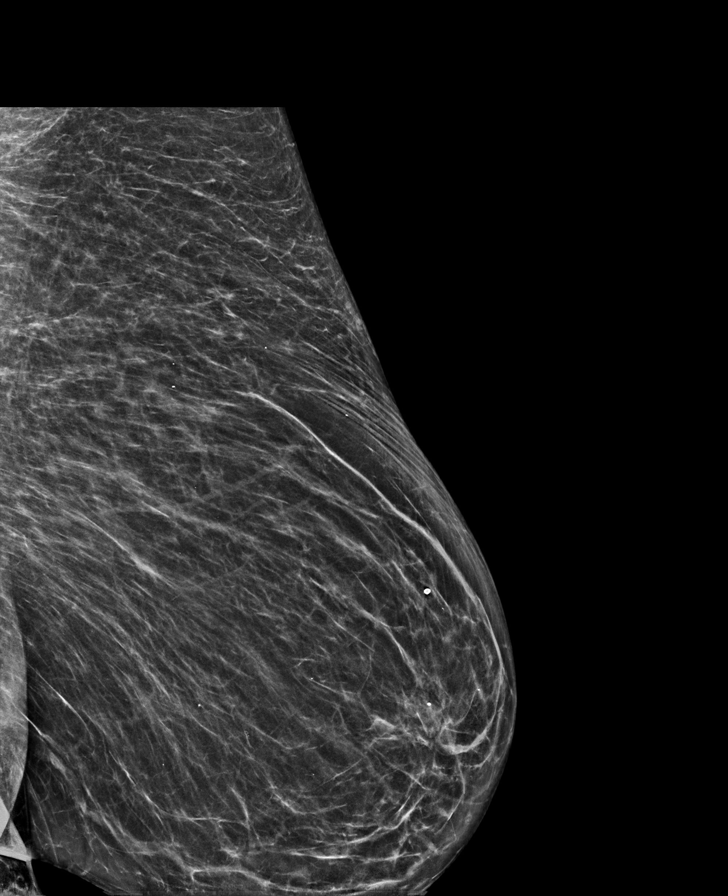

[L CC synth-2D (2 of 2)]
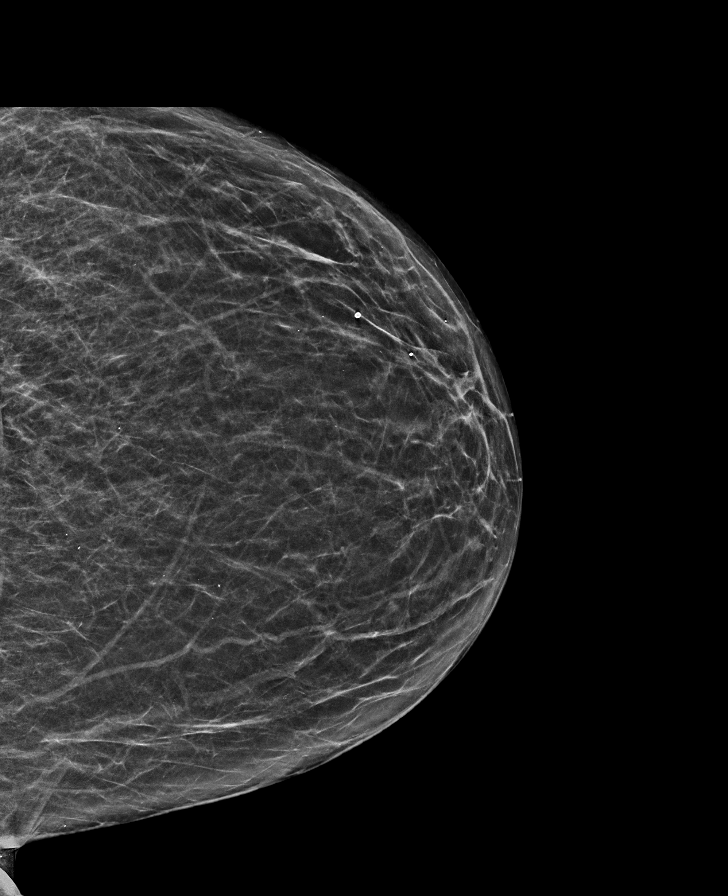

[R MLO synth-2D]
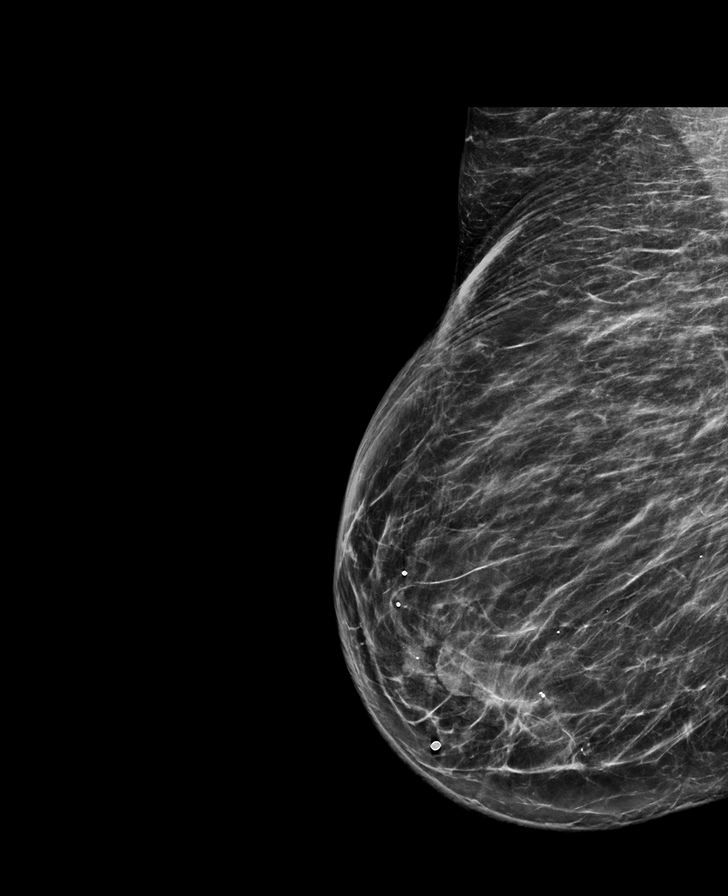

[L MLO tomo · tomo slice 35/68.0]
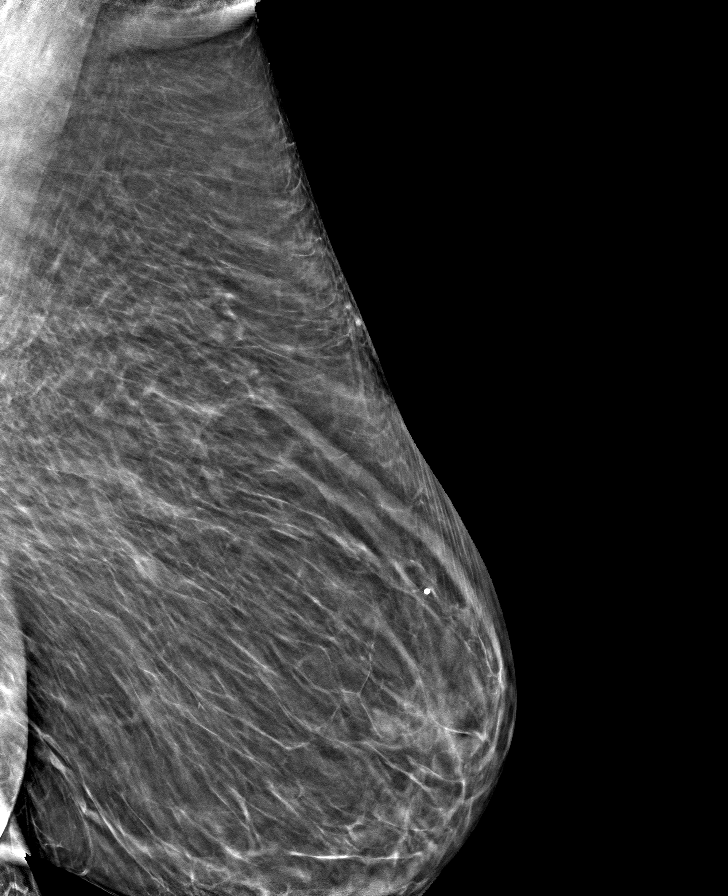

[8 of 40 positions shown; findings below may reference images not displayed]

ACR Breast Density Category b: There are scattered areas of
fibroglandular density.
FINDINGS: The right breast lumpectomy site is stable. No suspicious
calcifications, masses or areas of distortion are seen in the
bilateral breasts.

Mammographic images were processed with CAD.
IMPRESSION: Stable right breast lumpectomy site. No mammographic evidence of
malignancy in the bilateral breasts.

RECOMMENDATION:
Diagnostic mammogram is suggested in 1 year. (Code:[id number])

I have discussed the findings and recommendations with the patient.
Results were also provided in writing at the conclusion of the
visit. If applicable, a reminder letter will be sent to the patient
regarding the next appointment.

BI-RADS CATEGORY  2: Benign.

## 2019-04-14 NOTE — Progress Notes (Signed)
[place]  Telephone:([phone]) [phone] [phone]) [phone]  ID: Carollynn L Winton OB: Dec 30, 1945  MR#: [id number]  TDV#:[id number]  Patient Care Team: Jerrol Banana., MD as PCP - General (Family Medicine) Marry Guan, Laurice Record, MD as Consulting Physician (Orthopedic Surgery) Dasher, Rayvon Char, MD as Consulting Physician (Dermatology) Corey Skains, MD as Consulting Physician (Cardiology) Thelma Comp, OD as Consulting Physician (Optometry) Byrnett, Forest Gleason, MD (General Surgery) Noreene Filbert, MD as Referring Physician (Radiation Oncology) Lloyd Huger, MD as Consulting Physician (Oncology)  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  She continues to tolerate tamoxifen well without significant side effects.  She currently feels well and is at her baseline. She has no neurologic complaints. She denies any recent fevers or illnesses. She has a good appetite and denies weight loss.  She denies any chest pain, shortness of breath, cough, or hemoptysis.  She denies any nausea, vomiting, constipation, or diarrhea. She has no urinary complaints.  Patient offers no specific complaints today.  REVIEW OF SYSTEMS:   Review of Systems  Constitutional: Negative.  Negative for fever, malaise/fatigue and weight loss.  Respiratory: Negative.  Negative for cough and shortness of breath.   Cardiovascular: Negative.  Negative for chest pain and leg swelling.  Gastrointestinal: Negative.  Negative for abdominal pain.  Genitourinary: Negative.  Negative for dysuria.  Musculoskeletal: Negative.  Negative for back pain.  Skin: Negative.  Negative for rash.  Neurological: Negative.  Negative for sensory change, focal weakness and weakness.  Endo/Heme/Allergies: Negative for environmental allergies.  Psychiatric/Behavioral: Negative.  The patient is not nervous/anxious.     As per HPI.  Otherwise, a complete review of systems is negative.  PAST MEDICAL HISTORY: Past Medical History:  Diagnosis Date  . Anemia   . Arthritis   . Breast cancer ([place] 06/28/2016   right breast DCIS grade 3  . 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 Diabetes mellitus without complication ([place]   . Hyperlipidemia   . Hypertension   . Hyperthyroidism   . Obesity   . Personal history of radiation therapy   . Thyroid disease    hyperthyroidism  . Vitamin D deficiency     PAST SURGICAL HISTORY: Past Surgical History:  Procedure Laterality Date  . BREAST BIOPSY Right 06/28/2016   stereotactic biopsy - DUCTAL CARCINOMA IN SITU (DCIS), HIGH NUCLEAR GRADE  (very narrow margins)  . BREAST LUMPECTOMY Right 07/18/2016  . JOINT REPLACEMENT Left    tkr  . KNEE SURGERY Left    arthroscopy  . MASTECTOMY, PARTIAL Right 07/18/2016   Procedure: MASTECTOMY PARTIAL;  Surgeon: JRobert Bellow MD;  Location: [facility] ORS;  Service: General;  Laterality: Right;  . SKIN CANCER EXCISION     face    FAMILY HISTORY: Family History  Problem Relation Age of Onset  . Hypertension Mother   . Cervical cancer Mother   . Heart disease Father   . Hypertension Father   . Lung cancer Father   . Lung cancer Sister   . Brain cancer Sister   . Diabetes Brother   . Heart disease Brother   . Hypertension Brother   . Stroke Brother   . Breast cancer Neg Hx     ADVANCED DIRECTIVES (Y/N):  N  HEALTH MAINTENANCE: Social History   Tobacco Use  . Smoking status: Never Smoker  . Smokeless tobacco: Never Used  Substance Use Topics  .  Alcohol use: No  . Drug use: No     Colonoscopy:  PAP:  Bone density:  Lipid panel:  Allergies  Allergen Reactions  . Gabapentin Swelling  . Quinine Nausea Only and Nausea And Vomiting  . Amoxicillin Itching and Rash    Has patient had a PCN reaction causing immediate rash, facial/tongue/throat swelling, SOB or lightheadedness with hypotension: No Has patient had a PCN  reaction causing severe rash involving mucus membranes or skin necrosis: No Has patient had a PCN reaction that required hospitalization No Has patient had a PCN reaction occurring within the last 10 years: No If all of the above answers are "NO", then may proceed with Cephalosporin use.   . Dilaudid  [Hydromorphone Hcl] Rash  . Etodolac Rash  . Hydrochlorothiazide Rash  . Hydrocodone Rash  . Hydromorphone Rash  . Sulfa Antibiotics Rash    Current Outpatient Medications  Medication Sig Dispense Refill  . acetaminophen (TYLENOL) 500 MG tablet Take 500 mg by mouth every 6 (six) hours as needed for mild pain or moderate pain.    [name] atorvastatin (LIPITOR) 10 MG tablet Take 10 mg by mouth daily at 6 PM.     . Cholecalciferol (VITAMIN D) 2000 units CAPS Take 2,000 Units by mouth daily.     . furosemide (LASIX) 20 MG tablet TAKE 1 TABLET BY MOUTH EVERY DAY 30 tablet 3  . glipiZIDE (GLUCOTROL XL) 5 MG 24 hr tablet TAKE 1 TABLET BY MOUTH DAILY 90 tablet 3  . glucose blood (ONETOUCH VERIO) test strip Use as instructed; Check blood sugar twice daily 100 each 12  . lisinopril-hydrochlorothiazide (PRINZIDE,ZESTORETIC) 20-12.5 MG tablet TAKE 2 TABLETS BY MOUTH EVERY MORNING 60 tablet 12  . metoprolol succinate (TOPROL-XL) 100 MG 24 hr tablet TAKE 1 TABLET BY MOUTH DAILY WITH OR IMMEDIATELY FOLLOWING A MEAL 90 tablet 3  . pioglitazone (ACTOS) 45 MG tablet TAKE 1 TABLET(45 MG) BY MOUTH DAILY 30 tablet 11  . Semaglutide, 1 MG/DOSE, (OZEMPIC, 1 MG/DOSE,) 2 MG/1.5ML SOPN Inject 0.25 mg into the skin once a week. Inject 0.25 mg into skin once a week for first 4 weeks, then 0.5 mg once a week, starting on week 5. 1 pen 4  . sitaGLIPtin-metformin ([name]) 50-1000 MG tablet TAKE 1 TABLET BY MOUTH TWICE DAILY WITH A MEAL 180 tablet 3  . tamoxifen (NOLVADEX) 20 MG tablet TAKE 1 TABLET(20 MG) BY MOUTH DAILY 90 tablet 3  . Calcium Carbonate-Vit D-Min (GNP CALCIUM 1200) 1200-1000 MG-UNIT CHEW Chew 1,200 mg by mouth  daily with breakfast. Take in combination with vitamin D and magnesium. (Patient not taking: Reported on 09/20/2018) 30 tablet 5  . Magnesium 500 MG CAPS Take 1 capsule (500 mg total) by mouth at bedtime. 30 capsule 5   No current facility-administered medications for this visit.    OBJECTIVE: Vitals:   04/16/19 1034  BP: (!) 162/56  Pulse: (!) 53  Temp: (!) 97.1 F (36.2 C)     Body mass index is 44 kg/m.    ECOG FS:1 - Symptomatic but completely ambulatory  General: Well-developed, well-nourished, no acute distress.  Sitting in a wheelchair. Eyes: Pink conjunctiva, anicteric sclera. HEENT: Normocephalic, moist mucous membranes. Lungs: No audible wheezing or coughing. Heart: Regular rate and rhythm. Abdomen: Soft, nontender, no obvious distention. Musculoskeletal: No edema, cyanosis, or clubbing. Neuro: Alert, answering all questions appropriately. Cranial nerves grossly intact. Skin: No rashes or petechiae noted. Psych: Normal affect.  LAB RESULTS:  Lab Results  Component Value Date  NA 138 01/29/2019   K 4.3 01/29/2019   CL 104 01/29/2019   CO2 21 05/10/2018   GLUCOSE 132 (H) 01/29/2019   BUN 32 (H) 01/29/2019   CREATININE 1.41 (H) 01/29/2019   CALCIUM 9.4 01/29/2019   PROT 6.2 01/29/2019   ALBUMIN 3.6 (L) 01/29/2019   AST 15 01/29/2019   ALT 8 09/18/2017   ALKPHOS 30 (L) 01/29/2019   BILITOT 0.5 01/29/2019   GFRNONAA 37 (L) 01/29/2019   GFRAA 43 (L) 01/29/2019    Lab Results  Component Value Date   WBC 6.5 01/29/2019   NEUTROABS 3.9 01/29/2019   HGB 10.4 (L) 01/29/2019   HCT 33.7 (L) 01/29/2019   MCV 90 01/29/2019   PLT 199 01/29/2019     STUDIES: No results found.  ASSESSMENT: Right breast high grade DCIS.  PLAN:    1. Right breast high grade DCIS: Final pathology was reviewed independently as also discussed at breast case conference. Patient had close margins less than 0.5 mm and was given the option for reexcision to obtain better margins or  proceed directly with XRT. She declined reexcision and subsequently underwent adjuvant XRT which she completed in May 2018.  Continue tamoxifen for total 5 years completing treatment in May 2023.  Her most recent mammogram on June 18, 2018 was reported as BI-RADS 2.  Repeat in February 2021.  Return to clinic in 6 months for routine evaluation. 2.  Hypertension: Patient's blood pressure mildly elevated today.  Continue monitoring and treatment per primary care.  3.  Renal insufficiency: Patient's most recent creatinine was 1.41.  Monitor. 4.  Anemia: Mild, monitor.  Patient expressed understanding and was in agreement with this plan. She also understands that She can call clinic at any time with any questions, concerns, or complaints.   Cancer Staging Ductal carcinoma in situ (DCIS) of right breast Staging form: Breast, AJCC 8th Edition - Pathologic stage from 08/02/2016: Stage 0 (pTis (DCIS), pN0, cM0, ER: Positive, PR: Positive, HER2: Negative) - Signed by Lloyd Huger, MD on 08/02/2016   Lloyd Huger, MD   04/16/2019 4:55 PM

## 2019-04-16 ENCOUNTER — Encounter: Payer: Self-pay | Admitting: Oncology

## 2019-04-16 ENCOUNTER — Inpatient Hospital Stay: Payer: Medicare Other | Attending: Oncology | Admitting: Oncology

## 2019-04-16 ENCOUNTER — Other Ambulatory Visit: Payer: Self-pay

## 2019-04-16 VITALS — BP 162/56 | HR 53 | Temp 97.1°F | Wt 248.4 lb

## 2019-04-16 DIAGNOSIS — Z7984 Long term (current) use of oral hypoglycemic drugs: Secondary | ICD-10-CM | POA: Insufficient documentation

## 2019-04-16 DIAGNOSIS — D0511 Intraductal carcinoma in situ of right breast: Secondary | ICD-10-CM | POA: Diagnosis not present

## 2019-04-16 DIAGNOSIS — E785 Hyperlipidemia, unspecified: Secondary | ICD-10-CM | POA: Insufficient documentation

## 2019-04-16 DIAGNOSIS — Z8049 Family history of malignant neoplasm of other genital organs: Secondary | ICD-10-CM | POA: Diagnosis not present

## 2019-04-16 DIAGNOSIS — Z923 Personal history of irradiation: Secondary | ICD-10-CM | POA: Diagnosis not present

## 2019-04-16 DIAGNOSIS — Z85828 Personal history of other malignant neoplasm of skin: Secondary | ICD-10-CM | POA: Diagnosis not present

## 2019-04-16 DIAGNOSIS — E669 Obesity, unspecified: Secondary | ICD-10-CM | POA: Diagnosis not present

## 2019-04-16 DIAGNOSIS — Z79899 Other long term (current) drug therapy: Secondary | ICD-10-CM | POA: Insufficient documentation

## 2019-04-16 DIAGNOSIS — E119 Type 2 diabetes mellitus without complications: Secondary | ICD-10-CM | POA: Diagnosis not present

## 2019-04-16 DIAGNOSIS — I10 Essential (primary) hypertension: Secondary | ICD-10-CM | POA: Insufficient documentation

## 2019-04-16 DIAGNOSIS — Z833 Family history of diabetes mellitus: Secondary | ICD-10-CM | POA: Insufficient documentation

## 2019-04-16 DIAGNOSIS — E079 Disorder of thyroid, unspecified: Secondary | ICD-10-CM | POA: Diagnosis not present

## 2019-04-16 DIAGNOSIS — Z79810 Long term (current) use of selective estrogen receptor modulators (SERMs): Secondary | ICD-10-CM | POA: Diagnosis not present

## 2019-04-16 DIAGNOSIS — Z801 Family history of malignant neoplasm of trachea, bronchus and lung: Secondary | ICD-10-CM | POA: Diagnosis not present

## 2019-04-16 DIAGNOSIS — Z8249 Family history of ischemic heart disease and other diseases of the circulatory system: Secondary | ICD-10-CM | POA: Insufficient documentation

## 2019-04-16 DIAGNOSIS — D649 Anemia, unspecified: Secondary | ICD-10-CM | POA: Insufficient documentation

## 2019-04-16 DIAGNOSIS — N289 Disorder of kidney and ureter, unspecified: Secondary | ICD-10-CM | POA: Diagnosis not present

## 2019-04-16 NOTE — Progress Notes (Signed)
Patient denies any concerns today.  

## 2019-04-18 ENCOUNTER — Other Ambulatory Visit: Payer: Self-pay | Admitting: Oncology

## 2019-04-18 DIAGNOSIS — M25761 Osteophyte, right knee: Secondary | ICD-10-CM | POA: Diagnosis not present

## 2019-04-30 DIAGNOSIS — M545 Low back pain: Secondary | ICD-10-CM | POA: Diagnosis not present

## 2019-05-09 DIAGNOSIS — I10 Essential (primary) hypertension: Secondary | ICD-10-CM | POA: Diagnosis not present

## 2019-05-09 DIAGNOSIS — I493 Ventricular premature depolarization: Secondary | ICD-10-CM | POA: Diagnosis not present

## 2019-05-09 DIAGNOSIS — I119 Hypertensive heart disease without heart failure: Secondary | ICD-10-CM | POA: Diagnosis not present

## 2019-05-09 DIAGNOSIS — E119 Type 2 diabetes mellitus without complications: Secondary | ICD-10-CM | POA: Diagnosis not present

## 2019-05-09 DIAGNOSIS — E782 Mixed hyperlipidemia: Secondary | ICD-10-CM | POA: Diagnosis not present

## 2019-05-14 ENCOUNTER — Other Ambulatory Visit: Payer: Self-pay | Admitting: Family Medicine

## 2019-05-14 DIAGNOSIS — I10 Essential (primary) hypertension: Secondary | ICD-10-CM

## 2019-05-15 DIAGNOSIS — Z85828 Personal history of other malignant neoplasm of skin: Secondary | ICD-10-CM | POA: Diagnosis not present

## 2019-05-28 NOTE — Progress Notes (Signed)
Patient: Carol Ramsey, Female    DOB: May 16, 1945, 74 y.o.   MRN: [id number] Visit Date: 05/30/2019  Today's Provider: [name], MD   Chief Complaint  Patient presents with  . Medicare Wellness   Subjective:     Annual wellness visit Carol Ramsey is a 74 y.o. female. She feels fairly well. She reports exercising yes/some. She reports she is sleeping fairly well.  She says that she did not take her medications this morning for some reason. She has several complaints.  She has chronic back and knee pain.  She is followed at the [place] [facility].  She has a knee injection scheduled later.  She has right neck and right shoulder stiffness.  She also has hair loss especially on the top of her head. -----------------------------------------------------------  Colonoscopy: 01/19/2012 Mammogram: 06/18/2018  Review of Systems  Constitutional: Positive for fatigue.  HENT: Negative.   Eyes: Positive for photophobia.  Respiratory: Negative.   Cardiovascular: Positive for leg swelling.  Endocrine: Positive for cold intolerance.  Genitourinary: Negative.   Musculoskeletal: Positive for arthralgias and back pain.  Skin:       Hair loss  Allergic/Immunologic: Negative.   Hematological: Negative.   Psychiatric/Behavioral: Negative.     Social History   Socioeconomic History  . Marital status: Married    Spouse name: Not on file  . Number of children: 1  . Years of education: Not on file  . Highest education level: Bachelor's degree (e.g., BA, AB, BS)  Occupational History  . Occupation: retired  Tobacco Use  . Smoking status: Never Smoker  . Smokeless tobacco: Never Used  Substance and Sexual Activity  . Alcohol use: No  . Drug use: No  . Sexual activity: Never  Other Topics Concern  . Not on file  Social History Narrative  . Not on file   Social Determinants of Health   Financial Resource Strain:   . Difficulty of Paying Living Expenses: Not on file    Food Insecurity:   . Worried About Charity fundraiser in the Last Year: Not on file  . Ran Out of Food in the Last Year: Not on file  Transportation Needs:   . Lack of Transportation (Medical): Not on file  . Lack of Transportation (Non-Medical): Not on file  Physical Activity: Inactive  . Days of Exercise per Week: 0 days  . Minutes of Exercise per Session: 0 min  Stress:   . Feeling of Stress : Not on file  Social Connections: Unknown  . Frequency of Communication with Friends and Family: Patient refused  . Frequency of Social Gatherings with Friends and Family: Patient refused  . Attends Religious Services: Patient refused  . Active Member of Clubs or Organizations: Patient refused  . Attends Archivist Meetings: Patient refused  . Marital Status: Patient refused  Intimate Partner Violence: Unknown  . Fear of Current or Ex-Partner: Patient refused  . Emotionally Abused: Patient refused  . Physically Abused: Patient refused  . Sexually Abused: Patient refused    Past Medical History:  Diagnosis Date  . Anemia   . Arthritis   . 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 Diabetes mellitus without complication ([place])   . Hyperlipidemia   . Hypertension   . Hyperthyroidism   . Obesity   . Personal history of radiation therapy   .  Thyroid disease    hyperthyroidism  . Vitamin D deficiency      Patient Active Problem List   Diagnosis Date Noted  . OA (osteoarthritis) of knee 10/30/2018  . Lymphedema 10/30/2018  . DDD (degenerative disc disease), lumbar 02/28/2018  . Osteoarthritis of sacroiliac joint (Left) 02/28/2018  . Somatic dysfunction of sacroiliac joint (Left) 02/28/2018  . Sacroiliac joint dysfunction (Left) 02/28/2018  . Abnormal MRI, lumbar spine (07/22/2013) 02/28/2018  . Lumbar facet syndrome (Bilateral) 02/28/2018  . Lumbar spondylosis 02/28/2018  . 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 Lumbar facet arthropathy (Bilateral) 02/28/2018  . Lumbar intervertebral disc protrusion 02/28/2018  . Lumbar nerve root compression 02/28/2018  . Morbid obesity with BMI of 40.0-44.9, adult ([place]) 02/27/2018  . Hypoalbuminemia 02/27/2018  . Hypomagnesemia 02/27/2018  . Vitamin B 12 deficiency 02/27/2018  . Low serum vitamin B12 02/06/2018  . Morbid obesity ([place]) 02/01/2018  . Chronic low back pain (Primary Area of Pain) (Bilateral) w/ sciatica (Left) 02/01/2018  . Chronic lower extremity pain (Secondary Area of Pain) (Left) 02/01/2018  . Chronic pain syndrome 02/01/2018  . Pharmacologic therapy 02/01/2018  . Disorder of skeletal system 02/01/2018  . Problems influencing health status 02/01/2018  . Chronic sacroiliac joint pain (Left) 02/01/2018  . Bilateral leg edema 02/21/2017  . CKD (chronic kidney disease) stage 3, GFR 30-59 ml/min ([name]) 07/21/2016  . 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 LVH (left ventricular hypertrophy) due to hypertensive disease, without heart failure 01/19/2016  . Pulmonary hypertension ([place]) 02/10/2015  . Acquired hypothyroidism 09/17/2014  . Adaptation reaction 09/17/2014  . Absolute anemia 09/17/2014  . Cardiac dysrhythmia 09/17/2014  . Lumbar facet hypertrophy 09/17/2014  . 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 Diabetes mellitus type 2, uncomplicated ([place]) 44/81/8563  . Essential (primary) hypertension 09/17/2014  . HLD (hyperlipidemia) 09/17/2014  . Extreme obesity 09/17/2014  . Vitamin D insufficiency 09/17/2014  . Beat, premature ventricular 12/11/2013  . OBESITY 11/14/2007  . Achilles bursitis or tendinitis 11/14/2007  . Calcaneal bursitis (heel), unspecified laterality 06/26/2007    Past Surgical History:  Procedure Laterality Date  . BREAST BIOPSY Right 06/28/2016   stereotactic biopsy - DUCTAL CARCINOMA IN SITU (DCIS), HIGH NUCLEAR GRADE  (very narrow margins)  . BREAST  LUMPECTOMY Right 07/18/2016  . JOINT REPLACEMENT Left    tkr  . KNEE SURGERY Left    arthroscopy  . MASTECTOMY, PARTIAL Right 07/18/2016   Procedure: MASTECTOMY PARTIAL;  Surgeon: Robert Bellow, MD;  Location: [facility] ORS;  Service: General;  Laterality: Right;  . SKIN CANCER EXCISION     face    Her family history includes Brain cancer in her sister; Cervical cancer in her mother; Diabetes in her brother; Heart disease in her brother and father; Hypertension in her brother, father, and mother; Lung cancer in her father and sister; Stroke in her brother. There is no history of Breast cancer.   Current Outpatient Medications:  .  acetaminophen (TYLENOL) 500 MG tablet, Take 500 mg by mouth every 6 (six) hours as needed for mild pain or moderate pain., Disp: , Rfl:  .  atorvastatin (LIPITOR) 10 MG tablet, Take 10 mg by mouth daily at 6 PM. , Disp: , Rfl:  .  Cholecalciferol (VITAMIN D) 2000 units CAPS, Take 2,000 Units by mouth daily. , Disp: , Rfl:  .  furosemide (LASIX) 20 MG tablet, TAKE 1 TABLET BY MOUTH EVERY DAY, Disp: 30 tablet, Rfl:  3 .  glipiZIDE (GLUCOTROL XL) 5 MG 24 hr tablet, TAKE 1 TABLET BY MOUTH DAILY, Disp: 90 tablet, Rfl: 3 .  glucose blood (ONETOUCH VERIO) test strip, Use as instructed; Check blood sugar twice daily, Disp: 100 each, Rfl: 12 .  lisinopril-hydrochlorothiazide (PRINZIDE,ZESTORETIC) 20-12.5 MG tablet, TAKE 2 TABLETS BY MOUTH EVERY MORNING, Disp: 60 tablet, Rfl: 12 .  Magnesium 500 MG CAPS, Take 1 capsule (500 mg total) by mouth at bedtime., Disp: 30 capsule, Rfl: 5 .  metoprolol succinate (TOPROL-XL) 100 MG 24 hr tablet, TAKE 1 TABLET BY MOUTH DAILY WITH OR IMMEDIATELY FOLLOWING A MEAL, Disp: 90 tablet, Rfl: 1 .  pioglitazone (ACTOS) 45 MG tablet, TAKE 1 TABLET(45 MG) BY MOUTH DAILY, Disp: 30 tablet, Rfl: 11 .  sitaGLIPtin-metformin ([name]) 50-1000 MG tablet, TAKE 1 TABLET BY MOUTH TWICE DAILY WITH A MEAL, Disp: 180 tablet, Rfl: 3 .  tamoxifen (NOLVADEX) 20 MG  tablet, TAKE 1 TABLET(20 MG) BY MOUTH DAILY, Disp: 90 tablet, Rfl: 3 .  Calcium Carbonate-Vit D-Min (GNP CALCIUM 1200) 1200-1000 MG-UNIT CHEW, Chew 1,200 mg by mouth daily with breakfast. Take in combination with vitamin D and magnesium. (Patient not taking: Reported on 09/20/2018), Disp: 30 tablet, Rfl: 5 .  Semaglutide, 1 MG/DOSE, (OZEMPIC, 1 MG/DOSE,) 2 MG/1.5ML SOPN, Inject 0.25 mg into the skin once a week. Inject 0.25 mg into skin once a week for first 4 weeks, then 0.5 mg once a week, starting on week 5. (Patient not taking: Reported on 05/30/2019), Disp: 1 pen, Rfl: 4  Patient Care Team: Jerrol Banana., MD as PCP - General (Family Medicine) Marry Guan, Laurice Record, MD as Consulting Physician (Orthopedic Surgery) Dasher, Rayvon Char, MD as Consulting Physician (Dermatology) Corey Skains, MD as Consulting Physician (Cardiology) Thelma Comp, OD as Consulting Physician (Optometry) Bary Castilla, Forest Gleason, MD (General Surgery) Noreene Filbert, MD as Referring Physician (Radiation Oncology) Lloyd Huger, MD as Consulting Physician (Oncology)    Objective:    Vitals: BP (!) 185/74 (BP Location: Right Arm, Patient Position: Sitting, Cuff Size: Large)   Pulse (!) 49   Temp (!) 96.8 F (36 C) (Other (Comment))   Resp 18   Ht 5\' 3"  (1.6 m)   Wt 245 lb (111.1 kg)   SpO2 97%   BMI 43.40 kg/m   Physical Exam Vitals reviewed.  Constitutional:      General: She is not in acute distress.    Appearance: She is well-developed.  HENT:     Head: Normocephalic and atraumatic.     Right Ear: Hearing normal.     Left Ear: Hearing normal.     Nose: Nose normal.  Eyes:     General: Lids are normal. No scleral icterus.       Right eye: No discharge.        Left eye: No discharge.     Conjunctiva/sclera: Conjunctivae normal.  Cardiovascular:     Rate and Rhythm: Normal rate and regular rhythm.  Pulmonary:     Effort: Pulmonary effort is normal. No respiratory distress.   Musculoskeletal:     Right lower leg: Edema present.     Left lower leg: Edema present.  Skin:    Findings: No lesion or rash.     Comments: Hair loss on top of head.Female pattern thinning.  Neurological:     Mental Status: She is alert and oriented to person, place, and time.  Psychiatric:        Mood and Affect: Mood normal.  Speech: Speech normal.        Behavior: Behavior normal.        Thought Content: Thought content normal.        Judgment: Judgment normal.     Activities of Daily Living In your present state of health, do you have any difficulty performing the following activities: 05/30/2019 09/20/2018  Hearing? N N  Vision? N N  Comment - Wears eye glasses daily.   Difficulty concentrating or making decisions? N N  Walking or climbing stairs? Y Y  Comment - Due to weakness in legs.  Dressing or bathing? N N  Doing errands, shopping? Y N  Preparing Food and eating ? - N  Using the Toilet? - N  In the past six months, have you accidently leaked urine? - N  Do you have problems with loss of bowel control? - N  Managing your Medications? - N  Managing your Finances? - N  Housekeeping or managing your Housekeeping? - N  Some recent data might be hidden    Fall Risk Assessment Fall Risk  05/30/2019 09/20/2018 02/28/2018 02/01/2018 11/15/2017  Falls in the past year? 0 0 No No No  Number falls in past yr: 0 - - - -  Injury with Fall? 0 - - - -     Depression Screen PHQ 2/9 Scores 05/30/2019 09/20/2018 02/28/2018 02/01/2018  PHQ - 2 Score 0 0 0 0  PHQ- 9 Score 0 - - -    6CIT Screen 05/30/2019  What Year? 0 points  What month? 0 points  What time? 0 points  Count back from 20 0 points  Months in reverse 0 points  Repeat phrase 0 points  Total Score 0      Assessment & Plan:     Annual Wellness Visit  Reviewed patient's Family Medical History Reviewed and updated list of patient's medical providers Assessment of cognitive impairment was done Assessed  patient's functional ability Established a written schedule for health screening [place] Completed and Reviewed  Exercise Activities and Dietary recommendations Goals    . Exercise 3x per week (30 min per time)     Recommend to try the "sit down exercises" for 3 days a week for at least 20-30 minutes.     . Have 3 meals a day     Starting 05/12/15, I will work to make sure I am eating 3 meals a day.       Immunization History  Administered Date(s) Administered  . Fluad Quad(high Dose 65+) 01/29/2019  . Influenza Split 02/25/2006, 03/07/2011, 02/27/2012  . Influenza, High Dose Seasonal PF 03/03/2016, 01/26/2017, 01/08/2018  . Influenza,inj,Quad PF,6+ Mos 03/04/2013  . Pneumococcal Conjugate-13 04/13/2015  . Pneumococcal Polysaccharide-23 02/17/2007, 02/27/2012  . Td 10/21/2003  . Tdap 05/11/2016  . Zoster 11/08/2010    Health Maintenance  Topic Date Due  . DEXA SCAN  04/22/2013  . FOOT EXAM  01/26/2018  . OPHTHALMOLOGY EXAM  10/31/2018  . HEMOGLOBIN A1C  07/29/2019  . MAMMOGRAM  06/18/2020  . COLONOSCOPY  01/18/2022  . TETANUS/TDAP  05/11/2026  . INFLUENZA VACCINE  Completed  . Hepatitis C Screening  Completed  . PNA vac Low Risk Adult  Completed     Discussed health benefits of physical activity, and encouraged her to engage in regular exercise appropriate for her age and condition.    --------------------------------------------------------------------  1. Encounter for Medicare annual wellness exam  - CBC w/Diff/Platelet - Hemoglobin A1C - POCT UA -  Microalbumin - Renal function panel  2. Type 2 diabetes mellitus with complication, without long-term current use of insulin ([name])  - CBC w/Diff/Platelet - Hemoglobin A1C - POCT UA - Microalbumin - Renal function panel  3. Stage 3 chronic kidney disease, unspecified whether stage 3a or 3b CKD On lisinopril HCT - CBC w/Diff/Platelet - Hemoglobin A1C - POCT UA - Microalbumin - Renal  function panel  4. Anemia  ([name])  - CBC w/Diff/Platelet - Hemoglobin A1C - POCT UA - Microalbumin - Renal function panel  5. Pulmonary hypertension ([place])   6. Essential (primary) hypertension   7. Type 2 diabetes mellitus without complication, without long-term current use of insulin ([name]) Due to chronic edema I would to try to stop her pioglitazone to see if this will help some.  Follow-up in 3 months.  8. Chronic low back pain (Primary Area of Pain) (Bilateral) w/ sciatica (Left) Followed by pain clinic  9. Primary osteoarthritis of left knee   10. 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 Breast neoplasm, Tis (DCIS), right Yearly mammogram  12. Chronic lower extremity pain (Secondary Area of Pain) (Left)   13. Extreme obesity Morbid obesity with osteoarthritis, diabetes, hypertension.  Diet and exercise with weight loss is stressed.   Follow up in 1-2 months for face to face to discuss motor scooter.     I,Camron Essman,acting as a scribe for [name], MD.,have documented all relevant documentation on the behalf of [name], MD,as directed by  [name], MD while in the presence of [name], MD.   [name], MD  [facility]

## 2019-05-30 ENCOUNTER — Other Ambulatory Visit: Payer: Self-pay

## 2019-05-30 ENCOUNTER — Encounter: Payer: Self-pay | Admitting: Family Medicine

## 2019-05-30 ENCOUNTER — Ambulatory Visit (INDEPENDENT_AMBULATORY_CARE_PROVIDER_SITE_OTHER): Payer: Medicare Other | Admitting: Family Medicine

## 2019-05-30 VITALS — BP 177/80 | HR 50 | Temp 96.8°F | Resp 18 | Ht 63.0 in | Wt 245.0 lb

## 2019-05-30 DIAGNOSIS — E668 Other obesity: Secondary | ICD-10-CM | POA: Diagnosis not present

## 2019-05-30 DIAGNOSIS — N183 Chronic kidney disease, stage 3 unspecified: Secondary | ICD-10-CM | POA: Diagnosis not present

## 2019-05-30 DIAGNOSIS — E118 Type 2 diabetes mellitus with unspecified complications: Secondary | ICD-10-CM

## 2019-05-30 DIAGNOSIS — G8929 Other chronic pain: Secondary | ICD-10-CM

## 2019-05-30 DIAGNOSIS — D0511 Intraductal carcinoma in situ of right breast: Secondary | ICD-10-CM

## 2019-05-30 DIAGNOSIS — M25561 Pain in right knee: Secondary | ICD-10-CM | POA: Diagnosis not present

## 2019-05-30 DIAGNOSIS — D559 Anemia due to enzyme disorder, unspecified: Secondary | ICD-10-CM

## 2019-05-30 DIAGNOSIS — I10 Essential (primary) hypertension: Secondary | ICD-10-CM | POA: Diagnosis not present

## 2019-05-30 DIAGNOSIS — M48061 Spinal stenosis, lumbar region without neurogenic claudication: Secondary | ICD-10-CM

## 2019-05-30 DIAGNOSIS — M1711 Unilateral primary osteoarthritis, right knee: Secondary | ICD-10-CM | POA: Diagnosis not present

## 2019-05-30 DIAGNOSIS — E119 Type 2 diabetes mellitus without complications: Secondary | ICD-10-CM

## 2019-05-30 DIAGNOSIS — M1712 Unilateral primary osteoarthritis, left knee: Secondary | ICD-10-CM | POA: Diagnosis not present

## 2019-05-30 DIAGNOSIS — M25761 Osteophyte, right knee: Secondary | ICD-10-CM | POA: Diagnosis not present

## 2019-05-30 DIAGNOSIS — I272 Pulmonary hypertension, unspecified: Secondary | ICD-10-CM | POA: Diagnosis not present

## 2019-05-30 DIAGNOSIS — Z0000 Encounter for general adult medical examination without abnormal findings: Secondary | ICD-10-CM

## 2019-05-30 DIAGNOSIS — M5442 Lumbago with sciatica, left side: Secondary | ICD-10-CM | POA: Diagnosis not present

## 2019-05-30 DIAGNOSIS — M79605 Pain in left leg: Secondary | ICD-10-CM

## 2019-05-30 DIAGNOSIS — M222X1 Patellofemoral disorders, right knee: Secondary | ICD-10-CM | POA: Diagnosis not present

## 2019-05-30 NOTE — Patient Instructions (Addendum)
Stop pioglitazone due to swelling in legs. Follow up in 1-2 months for face to face to discuss motor scooter.

## 2019-05-31 LAB — CBC WITH DIFFERENTIAL/PLATELET
Basophils Absolute: 0.1 10*3/uL (ref 0.0–0.2)
Basos: 1 %
EOS (ABSOLUTE): 0.3 10*3/uL (ref 0.0–0.4)
Eos: 4 %
Hematocrit: 33.8 % — ABNORMAL LOW (ref 34.0–46.6)
Hemoglobin: 10.8 g/dL — ABNORMAL LOW (ref 11.1–15.9)
Immature Grans (Abs): 0 10*3/uL (ref 0.0–0.1)
Immature Granulocytes: 1 %
Lymphocytes Absolute: 1.7 10*3/uL (ref 0.7–3.1)
Lymphs: 22 %
MCH: 27.9 pg (ref 26.6–33.0)
MCHC: 32 g/dL (ref 31.5–35.7)
MCV: 87 fL (ref 79–97)
Monocytes Absolute: 0.6 10*3/uL (ref 0.1–0.9)
Monocytes: 8 %
Neutrophils Absolute: 5 10*3/uL (ref 1.4–7.0)
Neutrophils: 64 %
Platelets: 194 10*3/uL (ref 150–450)
RBC: 3.87 x10E6/uL (ref 3.77–5.28)
RDW: 12.5 % (ref 11.7–15.4)
WBC: 7.7 10*3/uL (ref 3.4–10.8)

## 2019-05-31 LAB — RENAL FUNCTION PANEL
Albumin: 3.7 g/dL (ref 3.7–4.7)
BUN/Creatinine Ratio: 17 (ref 12–28)
BUN: 21 mg/dL (ref 8–27)
CO2: 20 mmol/L (ref 20–29)
Calcium: 8.8 mg/dL (ref 8.7–10.3)
Chloride: 105 mmol/L (ref 96–106)
Creatinine, Ser: 1.21 mg/dL — ABNORMAL HIGH (ref 0.57–1.00)
GFR calc Af Amer: 52 mL/min/{1.73_m2} — ABNORMAL LOW (ref >59)
GFR calc non Af Amer: 45 mL/min/{1.73_m2} — ABNORMAL LOW (ref >59)
Glucose: 168 mg/dL — ABNORMAL HIGH (ref 65–99)
Phosphorus: 3.1 mg/dL (ref 3.0–4.3)
Potassium: 4.1 mmol/L (ref 3.5–5.2)
Sodium: 140 mmol/L (ref 134–144)

## 2019-05-31 LAB — HEMOGLOBIN A1C
Est. average glucose Bld gHb Est-mCnc: 226 mg/dL
Hgb A1c MFr Bld: 9.5 % — ABNORMAL HIGH (ref 4.8–5.6)

## 2019-06-03 ENCOUNTER — Telehealth: Payer: Self-pay

## 2019-06-03 NOTE — Telephone Encounter (Signed)
Left detail vm regarding patient's lab results.

## 2019-06-06 DIAGNOSIS — M1711 Unilateral primary osteoarthritis, right knee: Secondary | ICD-10-CM | POA: Diagnosis not present

## 2019-06-06 DIAGNOSIS — R2689 Other abnormalities of gait and mobility: Secondary | ICD-10-CM | POA: Diagnosis not present

## 2019-06-06 DIAGNOSIS — M25761 Osteophyte, right knee: Secondary | ICD-10-CM | POA: Diagnosis not present

## 2019-06-06 DIAGNOSIS — M25561 Pain in right knee: Secondary | ICD-10-CM | POA: Diagnosis not present

## 2019-06-06 DIAGNOSIS — M222X1 Patellofemoral disorders, right knee: Secondary | ICD-10-CM | POA: Diagnosis not present

## 2019-06-13 DIAGNOSIS — R2689 Other abnormalities of gait and mobility: Secondary | ICD-10-CM | POA: Diagnosis not present

## 2019-06-13 DIAGNOSIS — M1711 Unilateral primary osteoarthritis, right knee: Secondary | ICD-10-CM | POA: Diagnosis not present

## 2019-06-13 DIAGNOSIS — M222X1 Patellofemoral disorders, right knee: Secondary | ICD-10-CM | POA: Diagnosis not present

## 2019-06-13 DIAGNOSIS — M25761 Osteophyte, right knee: Secondary | ICD-10-CM | POA: Diagnosis not present

## 2019-06-13 DIAGNOSIS — M25561 Pain in right knee: Secondary | ICD-10-CM | POA: Diagnosis not present

## 2019-06-20 ENCOUNTER — Other Ambulatory Visit: Payer: Medicare Other

## 2019-06-25 DIAGNOSIS — L570 Actinic keratosis: Secondary | ICD-10-CM | POA: Diagnosis not present

## 2019-06-25 DIAGNOSIS — L821 Other seborrheic keratosis: Secondary | ICD-10-CM | POA: Diagnosis not present

## 2019-06-25 DIAGNOSIS — H61031 Chondritis of right external ear: Secondary | ICD-10-CM | POA: Diagnosis not present

## 2019-06-25 DIAGNOSIS — D225 Melanocytic nevi of trunk: Secondary | ICD-10-CM | POA: Diagnosis not present

## 2019-06-25 DIAGNOSIS — D2262 Melanocytic nevi of left upper limb, including shoulder: Secondary | ICD-10-CM | POA: Diagnosis not present

## 2019-06-25 DIAGNOSIS — D2261 Melanocytic nevi of right upper limb, including shoulder: Secondary | ICD-10-CM | POA: Diagnosis not present

## 2019-06-27 ENCOUNTER — Ambulatory Visit
Admission: RE | Admit: 2019-06-27 | Discharge: 2019-06-27 | Disposition: A | Discharge status: Home / Self Care | Payer: Medicare Other | Source: Ambulatory Visit | Attending: Oncology | Admitting: Oncology

## 2019-06-27 DIAGNOSIS — Z853 Personal history of malignant neoplasm of breast: Secondary | ICD-10-CM | POA: Diagnosis not present

## 2019-06-27 DIAGNOSIS — D0511 Intraductal carcinoma in situ of right breast: Secondary | ICD-10-CM

## 2019-06-27 DIAGNOSIS — R2689 Other abnormalities of gait and mobility: Secondary | ICD-10-CM | POA: Diagnosis not present

## 2019-06-27 DIAGNOSIS — M25761 Osteophyte, right knee: Secondary | ICD-10-CM | POA: Diagnosis not present

## 2019-06-27 DIAGNOSIS — M222X1 Patellofemoral disorders, right knee: Secondary | ICD-10-CM | POA: Diagnosis not present

## 2019-06-27 DIAGNOSIS — M1711 Unilateral primary osteoarthritis, right knee: Secondary | ICD-10-CM | POA: Diagnosis not present

## 2019-06-27 DIAGNOSIS — M25561 Pain in right knee: Secondary | ICD-10-CM | POA: Diagnosis not present

## 2019-06-27 DIAGNOSIS — R928 Other abnormal and inconclusive findings on diagnostic imaging of breast: Secondary | ICD-10-CM | POA: Diagnosis not present

## 2019-06-28 NOTE — Progress Notes (Signed)
Patient: Carol Ramsey Female    DOB: 07-06-1945   74 y.o.   MRN: [id number] Visit Date: 06/28/2019  Today's Provider: [name], MD   No chief complaint on file.  Subjective:     HPI   Patient is here for face to face office visit, to discuss motor scooter.  She is morbidly obese with a BMI of 43.86.  She has significant osteoarthritis, degenerative disc disease, spinal stenosis.  She is able to walk about 50 feet with a cane.  She has to walk up about 4 steps to get her in her house.  She does not have a home ramp.  She can use her arms. She lives at home with her husband.  Once or in the house it is a single level home. She has her second Covid vaccine tomorrow.  Allergies  Allergen Reactions  . Gabapentin Swelling  . Quinine Nausea Only and Nausea And Vomiting  . Amoxicillin Itching and Rash    Has patient had a PCN reaction causing immediate rash, facial/tongue/throat swelling, SOB or lightheadedness with hypotension: No Has patient had a PCN reaction causing severe rash involving mucus membranes or skin necrosis: No Has patient had a PCN reaction that required hospitalization No Has patient had a PCN reaction occurring within the last 10 years: No If all of the above answers are "NO", then may proceed with Cephalosporin use.   . Dilaudid  [Hydromorphone Hcl] Rash  . Etodolac Rash  . Hydrochlorothiazide Rash  . Hydrocodone Rash  . Hydromorphone Rash  . Sulfa Antibiotics Rash     Current Outpatient Medications:  .  acetaminophen (TYLENOL) 500 MG tablet, Take 500 mg by mouth every 6 (six) hours as needed for mild pain or moderate pain., Disp: , Rfl:  .  atorvastatin (LIPITOR) 10 MG tablet, Take 10 mg by mouth daily at 6 PM. , Disp: , Rfl:  .  Calcium Carbonate-Vit D-Min (GNP CALCIUM 1200) 1200-1000 MG-UNIT CHEW, Chew 1,200 mg by mouth daily with breakfast. Take in combination with vitamin D and magnesium. (Patient not taking: Reported on 09/20/2018), Disp:  30 tablet, Rfl: 5 .  Cholecalciferol (VITAMIN D) 2000 units CAPS, Take 2,000 Units by mouth daily. , Disp: , Rfl:  .  furosemide (LASIX) 20 MG tablet, TAKE 1 TABLET BY MOUTH EVERY DAY, Disp: 30 tablet, Rfl: 3 .  glipiZIDE (GLUCOTROL XL) 5 MG 24 hr tablet, TAKE 1 TABLET BY MOUTH DAILY, Disp: 90 tablet, Rfl: 3 .  glucose blood (ONETOUCH VERIO) test strip, Use as instructed; Check blood sugar twice daily, Disp: 100 each, Rfl: 12 .  lisinopril-hydrochlorothiazide (PRINZIDE,ZESTORETIC) 20-12.5 MG tablet, TAKE 2 TABLETS BY MOUTH EVERY MORNING, Disp: 60 tablet, Rfl: 12 .  Magnesium 500 MG CAPS, Take 1 capsule (500 mg total) by mouth at bedtime., Disp: 30 capsule, Rfl: 5 .  metoprolol succinate (TOPROL-XL) 100 MG 24 hr tablet, TAKE 1 TABLET BY MOUTH DAILY WITH OR IMMEDIATELY FOLLOWING A MEAL, Disp: 90 tablet, Rfl: 1 .  pioglitazone (ACTOS) 45 MG tablet, TAKE 1 TABLET(45 MG) BY MOUTH DAILY, Disp: 30 tablet, Rfl: 11 .  Semaglutide, 1 MG/DOSE, (OZEMPIC, 1 MG/DOSE,) 2 MG/1.5ML SOPN, Inject 0.25 mg into the skin once a week. Inject 0.25 mg into skin once a week for first 4 weeks, then 0.5 mg once a week, starting on week 5. (Patient not taking: Reported on 05/30/2019), Disp: 1 pen, Rfl: 4 .  sitaGLIPtin-metformin ([name]) 50-1000 MG tablet, TAKE 1 TABLET  BY MOUTH TWICE DAILY WITH A MEAL, Disp: 180 tablet, Rfl: 3 .  tamoxifen (NOLVADEX) 20 MG tablet, TAKE 1 TABLET(20 MG) BY MOUTH DAILY, Disp: 90 tablet, Rfl: 3  Review of Systems  Constitutional: Negative for appetite change, chills, fatigue and fever.  HENT: Negative.   Eyes: Negative.   Respiratory: Negative for chest tightness and shortness of breath.   Cardiovascular: Negative for chest pain and palpitations.  Gastrointestinal: Negative for abdominal pain, nausea and vomiting.  Endocrine: Negative.   Musculoskeletal: Positive for arthralgias, back pain and gait problem.  Allergic/Immunologic: Negative.   Neurological: Negative for dizziness and weakness.   Hematological: Negative.   Psychiatric/Behavioral: Negative.     Social History   Tobacco Use  . Smoking status: Never Smoker  . Smokeless tobacco: Never Used  Substance Use Topics  . Alcohol use: No      Objective:   There were no vitals taken for this visit. There were no vitals filed for this visit.There is no height or weight on file to calculate BMI.   Physical Exam Vitals reviewed.  Constitutional:      Appearance: She is well-developed. She is obese.     Comments: Obese WF NAD.  HENT:     Head: Normocephalic and atraumatic.     Right Ear: External ear normal.     Left Ear: External ear normal.     Nose: Nose normal.  Eyes:     General: No scleral icterus.    Conjunctiva/sclera: Conjunctivae normal.  Neck:     Thyroid: No thyromegaly.  Cardiovascular:     Rate and Rhythm: Normal rate and regular rhythm.     Heart sounds: Normal heart sounds.  Pulmonary:     Effort: Pulmonary effort is normal.     Breath sounds: Normal breath sounds.  Abdominal:     Palpations: Abdomen is soft.  Musculoskeletal:     Comments: She has chronic lower extremity lymphedema.  Skin:    General: Skin is warm and dry.     Comments: Very fair skin.  Neurological:     General: No focal deficit present.     Mental Status: She is alert and oriented to person, place, and time.  Psychiatric:        Mood and Affect: Mood normal.        Behavior: Behavior normal.        Thought Content: Thought content normal.        Judgment: Judgment normal.      No results found for any visits on 07/01/19.     Assessment & Plan     1. Chronic low back pain (Primary Area of Pain) (Bilateral) w/ sciatica (Left) Patient has multiple causes for her difficulty ambulating.  She requests prescription for a scooter and I am happy to write one but I am not sure she qualifies by Medicare standards.  She still ambulates around the house with a cane.  She does not have a ramp at the house.  He is very  limited in how far she can walk due to osteoarthritis, chronic back pain, and morbid obesity.  2. Lumbar spondylosis   3. Primary osteoarthritis of left knee   4. Spinal stenosis of lumbar region, unspecified whether neurogenic claudication present   5. Ductal carcinoma in situ (DCIS) of right breast Per oncology  6. Extreme obesity Diet and exercise discussed again.  Any weight she can lose will be helpful.  7. Abnormal MRI, lumbar spine (07/22/2013)  There are no diagnoses linked to this encounter.     Richard Cranford Mon, MD  [facility]

## 2019-07-01 ENCOUNTER — Other Ambulatory Visit: Payer: Self-pay

## 2019-07-01 ENCOUNTER — Ambulatory Visit (INDEPENDENT_AMBULATORY_CARE_PROVIDER_SITE_OTHER): Payer: Medicare Other | Admitting: Family Medicine

## 2019-07-01 ENCOUNTER — Encounter: Payer: Self-pay | Admitting: Family Medicine

## 2019-07-01 VITALS — BP 159/75 | HR 65 | Temp 96.6°F | Wt 247.6 lb

## 2019-07-01 DIAGNOSIS — M48061 Spinal stenosis, lumbar region without neurogenic claudication: Secondary | ICD-10-CM

## 2019-07-01 DIAGNOSIS — G8929 Other chronic pain: Secondary | ICD-10-CM

## 2019-07-01 DIAGNOSIS — M47816 Spondylosis without myelopathy or radiculopathy, lumbar region: Secondary | ICD-10-CM | POA: Diagnosis not present

## 2019-07-01 DIAGNOSIS — E668 Other obesity: Secondary | ICD-10-CM

## 2019-07-01 DIAGNOSIS — M1712 Unilateral primary osteoarthritis, left knee: Secondary | ICD-10-CM

## 2019-07-01 DIAGNOSIS — R937 Abnormal findings on diagnostic imaging of other parts of musculoskeletal system: Secondary | ICD-10-CM

## 2019-07-01 DIAGNOSIS — M5442 Lumbago with sciatica, left side: Secondary | ICD-10-CM

## 2019-07-01 DIAGNOSIS — D0511 Intraductal carcinoma in situ of right breast: Secondary | ICD-10-CM

## 2019-07-03 DIAGNOSIS — M545 Low back pain: Secondary | ICD-10-CM | POA: Diagnosis not present

## 2019-07-03 DIAGNOSIS — M62830 Muscle spasm of back: Secondary | ICD-10-CM | POA: Diagnosis not present

## 2019-07-04 DIAGNOSIS — M25761 Osteophyte, right knee: Secondary | ICD-10-CM | POA: Diagnosis not present

## 2019-07-04 DIAGNOSIS — M222X1 Patellofemoral disorders, right knee: Secondary | ICD-10-CM | POA: Diagnosis not present

## 2019-07-04 DIAGNOSIS — R2689 Other abnormalities of gait and mobility: Secondary | ICD-10-CM | POA: Diagnosis not present

## 2019-07-04 DIAGNOSIS — M1711 Unilateral primary osteoarthritis, right knee: Secondary | ICD-10-CM | POA: Diagnosis not present

## 2019-07-04 DIAGNOSIS — M25561 Pain in right knee: Secondary | ICD-10-CM | POA: Diagnosis not present

## 2019-07-18 DIAGNOSIS — M1712 Unilateral primary osteoarthritis, left knee: Secondary | ICD-10-CM | POA: Diagnosis not present

## 2019-07-18 DIAGNOSIS — M25761 Osteophyte, right knee: Secondary | ICD-10-CM | POA: Diagnosis not present

## 2019-07-18 DIAGNOSIS — Z96652 Presence of left artificial knee joint: Secondary | ICD-10-CM | POA: Diagnosis not present

## 2019-08-02 ENCOUNTER — Other Ambulatory Visit: Payer: Self-pay | Admitting: Family Medicine

## 2019-09-05 DIAGNOSIS — M62830 Muscle spasm of back: Secondary | ICD-10-CM | POA: Diagnosis not present

## 2019-09-19 NOTE — Progress Notes (Deleted)
Error

## 2019-09-20 NOTE — Progress Notes (Signed)
Established patient visit   Patient: Carol Ramsey   DOB: 05/16/1945   74 y.o. Female  MRN: [id number] Visit Date: 09/26/2019  Today's healthcare provider: [name], MD   Chief Complaint  Patient presents with  . Follow-up  . Diabetes  . Hypertension   Subjective    HPI  We stopped Actos on her last visit and she has felt bad  on Ozempic. Diabetes Mellitus Type II, follow-up  Lab Results  Component Value Date   HGBA1C 9.5 (H) 05/30/2019   HGBA1C 8.3 (A) 01/29/2019   HGBA1C 11.1 (A) 10/29/2018   Last seen for diabetes 4 months ago.  Management since then includes; Due to chronic edema I would to try to stop her pioglitazone to see if this will help some. Follow-up in 3 months. She reports good compliance with treatment. She is not having side effects. none  Home blood sugar records: fasting range: 100-140  Episodes of hypoglycemia? No none   Current insulin regiment: n/a Most Recent Eye Exam: Due on July  --------------------------------------------------------------------  Hypertension, follow-up  BP Readings from Last 3 Encounters:  07/01/19 (!) 159/75  05/30/19 (!) 177/80  04/16/19 (!) 162/56   Wt Readings from Last 3 Encounters:  07/01/19 247 lb 9.6 oz (112.3 kg)  05/30/19 245 lb (111.1 kg)  04/16/19 248 lb 6.4 oz (112.7 kg)     She was last seen for hypertension 4 months ago.  BP at that visit was 177/80. Management since that visit includes; continue current medications. She reports good compliance with treatment. She is not having side effects. none She is not exercising. She is adherent to low salt diet.   Outside blood pressures are not checking.  She does not smoke.  Use of agents associated with hypertension: none.   --------------------------------------------------------------------       Medications: Outpatient Medications Prior to Visit  Medication Sig  . acetaminophen (TYLENOL) 500 MG tablet Take 500 mg by mouth  every 6 (six) hours as needed for mild pain or moderate pain.  [name] atorvastatin (LIPITOR) 10 MG tablet Take 10 mg by mouth daily at 6 PM.   . Cholecalciferol (VITAMIN D) 2000 units CAPS Take 2,000 Units by mouth daily.   . furosemide (LASIX) 20 MG tablet TAKE 1 TABLET BY MOUTH EVERY DAY  . glipiZIDE (GLUCOTROL XL) 5 MG 24 hr tablet TAKE 1 TABLET BY MOUTH DAILY  . glucose blood (ONETOUCH VERIO) test strip Use as instructed; Check blood sugar twice daily  . lisinopril-hydrochlorothiazide (ZESTORETIC) 20-12.5 MG tablet TAKE 2 TABLETS BY MOUTH EVERY MORNING  . metoprolol succinate (TOPROL-XL) 100 MG 24 hr tablet TAKE 1 TABLET BY MOUTH DAILY WITH OR IMMEDIATELY FOLLOWING A MEAL  . pioglitazone (ACTOS) 45 MG tablet TAKE 1 TABLET(45 MG) BY MOUTH DAILY  . sitaGLIPtin-metformin ([name]) 50-1000 MG tablet TAKE 1 TABLET BY MOUTH TWICE DAILY WITH A MEAL  . tamoxifen (NOLVADEX) 20 MG tablet TAKE 1 TABLET(20 MG) BY MOUTH DAILY  . Calcium Carbonate-Vit D-Min (GNP CALCIUM 1200) 1200-1000 MG-UNIT CHEW Chew 1,200 mg by mouth daily with breakfast. Take in combination with vitamin D and magnesium. (Patient not taking: Reported on 09/20/2018)  . Magnesium 500 MG CAPS Take 1 capsule (500 mg total) by mouth at bedtime.  . Semaglutide, 1 MG/DOSE, (OZEMPIC, 1 MG/DOSE,) 2 MG/1.5ML SOPN Inject 0.25 mg into the skin once a week. Inject 0.25 mg into skin once a week for first 4 weeks, then 0.5 mg once a week, starting on  week 5. (Patient not taking: Reported on 09/26/2019)   No facility-administered medications prior to visit.    Review of Systems  Constitutional: Negative for appetite change, chills, fatigue and fever.  HENT: Negative.   Eyes: Negative.   Respiratory: Negative.  Negative for chest tightness and shortness of breath.   Cardiovascular: Positive for leg swelling. Negative for chest pain and palpitations.  Gastrointestinal: Negative for abdominal pain, nausea and vomiting.  Endocrine: Positive for cold  intolerance.  Genitourinary: Negative.   Musculoskeletal: Positive for arthralgias and back pain.  Skin:       Hair loss  Allergic/Immunologic: Negative.   Neurological: Negative for dizziness and weakness.  Hematological: Negative.   Psychiatric/Behavioral: Negative.     Last hemoglobin A1c Lab Results  Component Value Date   HGBA1C 8.3 (A) 09/26/2019      Objective    There were no vitals taken for this visit. BP Readings from Last 3 Encounters:  09/26/19 (!) 143/74  07/01/19 (!) 159/75  05/30/19 (!) 177/80   Wt Readings from Last 3 Encounters:  09/26/19 244 lb (110.7 kg)  07/01/19 247 lb 9.6 oz (112.3 kg)  05/30/19 245 lb (111.1 kg)      Physical Exam Vitals and nursing note reviewed.  Constitutional:      Appearance: Normal appearance. She is normal weight.  HENT:     Right Ear: Tympanic membrane normal.     Left Ear: Tympanic membrane normal.     Nose: Nose normal.     Mouth/Throat:     Mouth: Mucous membranes are moist.     Pharynx: Oropharynx is clear.  Eyes:     Conjunctiva/sclera: Conjunctivae normal.  Cardiovascular:     Rate and Rhythm: Normal rate and regular rhythm.     Pulses: Normal pulses.     Heart sounds: Normal heart sounds.  Pulmonary:     Effort: Pulmonary effort is normal.     Breath sounds: Normal breath sounds.  Abdominal:     General: Bowel sounds are normal.     Palpations: Abdomen is soft.  Musculoskeletal:        General: Normal range of motion.     Cervical back: Normal range of motion and neck supple.  Skin:    General: Skin is warm and dry.     Comments: Very fair skin.  Neurological:     General: No focal deficit present.     Mental Status: She is alert and oriented to person, place, and time.  Psychiatric:        Mood and Affect: Mood normal.        Behavior: Behavior normal.        Thought Content: Thought content normal.        Judgment: Judgment normal.       No results found for any visits on 09/26/19.   Assessment & Plan     1. Type 2 diabetes mellitus with complication, without long-term current use of insulin ([name]) 8.3 today. - POCT glycosylated hemoglobin (Hb A1C)  2. Essential (primary) hypertension On lisinopril/HCT  3. Acquired hypothyroidism   4. 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  5. Lumbar nerve root compression   6. Low serum vitamin B12   7. Ductal carcinoma in situ (DCIS) of right breast   8. Pure hypercholesterolemia On atorvastatin  9. Extreme obesity Lifestyle brought up again.  10. Bilateral leg edema Clinically stable.  Use support hose.  No follow-ups on file.      I, [name], MD, have reviewed all documentation for this visit. The documentation on 09/29/19 for the exam, diagnosis, procedures, and orders are all accurate and complete.    Dick Hark Cranford Mon, MD  [facility] [phone] (phone) [phone] (fax)  [place]

## 2019-09-23 ENCOUNTER — Ambulatory Visit: Payer: Medicare Other

## 2019-09-25 ENCOUNTER — Ambulatory Visit: Payer: Medicare Other | Admitting: Family Medicine

## 2019-09-26 ENCOUNTER — Encounter: Payer: Self-pay | Admitting: Family Medicine

## 2019-09-26 ENCOUNTER — Ambulatory Visit (INDEPENDENT_AMBULATORY_CARE_PROVIDER_SITE_OTHER): Payer: Medicare Other | Admitting: Family Medicine

## 2019-09-26 ENCOUNTER — Other Ambulatory Visit: Payer: Self-pay

## 2019-09-26 VITALS — BP 143/74 | HR 76 | Temp 97.1°F | Resp 18 | Ht 63.0 in | Wt 244.0 lb

## 2019-09-26 DIAGNOSIS — E118 Type 2 diabetes mellitus with unspecified complications: Secondary | ICD-10-CM | POA: Diagnosis not present

## 2019-09-26 DIAGNOSIS — M5416 Radiculopathy, lumbar region: Secondary | ICD-10-CM

## 2019-09-26 DIAGNOSIS — I10 Essential (primary) hypertension: Secondary | ICD-10-CM

## 2019-09-26 DIAGNOSIS — E668 Other obesity: Secondary | ICD-10-CM

## 2019-09-26 DIAGNOSIS — G8929 Other chronic pain: Secondary | ICD-10-CM | POA: Diagnosis not present

## 2019-09-26 DIAGNOSIS — E7800 Pure hypercholesterolemia, unspecified: Secondary | ICD-10-CM | POA: Diagnosis not present

## 2019-09-26 DIAGNOSIS — M5442 Lumbago with sciatica, left side: Secondary | ICD-10-CM | POA: Diagnosis not present

## 2019-09-26 DIAGNOSIS — E039 Hypothyroidism, unspecified: Secondary | ICD-10-CM | POA: Diagnosis not present

## 2019-09-26 DIAGNOSIS — E538 Deficiency of other specified B group vitamins: Secondary | ICD-10-CM | POA: Diagnosis not present

## 2019-09-26 DIAGNOSIS — R600 Localized edema: Secondary | ICD-10-CM

## 2019-09-26 DIAGNOSIS — D0511 Intraductal carcinoma in situ of right breast: Secondary | ICD-10-CM | POA: Diagnosis not present

## 2019-09-26 LAB — POCT GLYCOSYLATED HEMOGLOBIN (HGB A1C)
Est. average glucose Bld gHb Est-mCnc: 192
Hemoglobin A1C: 8.3 % — AB (ref 4.0–5.6)

## 2019-10-15 NOTE — Progress Notes (Signed)
[place]  [phone] [phone]  ID: Yasmeen L Brunelle OB: 11-Mar-1946  MR#: [id number]  DSK#:[id number]  Patient Care Team: Jerrol Banana., MD as PCP - General (Family Medicine) Marry Guan, Laurice Record, MD as Consulting Physician (Orthopedic Surgery) Dasher, Rayvon Char, MD as Consulting Physician (Dermatology) Corey Skains, MD as Consulting Physician (Cardiology) Thelma Comp, OD as Consulting Physician (Optometry) Byrnett, Forest Gleason, MD (General Surgery) Noreene Filbert, MD as Referring Physician (Radiation Oncology) Lloyd Huger, MD as Consulting Physician (Oncology)  CHIEF COMPLAINT: Right breast high grade DCIS  INTERVAL HISTORY: Patient returns to clinic today for routine 69-monthevaluation.  She continues to tolerate tamoxifen well without significant side effects.  She currently feels well and is at her baseline. She has no neurologic complaints. She denies any recent fevers or illnesses. She has a good appetite and denies weight loss.  She denies any chest pain, shortness of breath, cough, or hemoptysis.  She denies any nausea, vomiting, constipation, or diarrhea. She has no urinary complaints.  Patient offers no specific complaints today.  REVIEW OF SYSTEMS:   Review of Systems  Constitutional: Negative.  Negative for fever, malaise/fatigue and weight loss.  Respiratory: Negative.  Negative for cough and shortness of breath.   Cardiovascular: Negative.  Negative for chest pain and leg swelling.  Gastrointestinal: Negative.  Negative for abdominal pain.  Genitourinary: Negative.  Negative for dysuria.  Musculoskeletal: Negative.  Negative for back pain.  Skin: Negative.  Negative for rash.  Neurological: Negative.  Negative for sensory change, focal weakness and weakness.  Endo/Heme/Allergies: Negative for environmental allergies.  Psychiatric/Behavioral: Negative.  The patient is not nervous/anxious.     As per HPI.  Otherwise, a complete review of systems is negative.  PAST MEDICAL HISTORY: Past Medical History:  Diagnosis Date  . Anemia   . Arthritis   . [place]   right breast DCIS grade 3  . 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 Diabetes mellitus without complication ([place]   . Hyperlipidemia   . Hypertension   . Hyperthyroidism   . Obesity   . Personal history of radiation therapy    Right lumpectomy 2018  . Thyroid disease    hyperthyroidism  . Vitamin D deficiency     PAST SURGICAL HISTORY: Past Surgical History:  Procedure Laterality Date  . BREAST BIOPSY Right 06/28/2016   stereotactic biopsy - DUCTAL CARCINOMA IN SITU (DCIS), HIGH NUCLEAR GRADE  (very narrow margins)  . BREAST LUMPECTOMY Right 07/18/2016  . JOINT REPLACEMENT Left    tkr  . KNEE SURGERY Left    arthroscopy  . MASTECTOMY, PARTIAL Right 07/18/2016   Procedure: MASTECTOMY PARTIAL;  Surgeon: JRobert Bellow MD;  Location: [facility] ORS;  Service: General;  Laterality: Right;  . SKIN CANCER EXCISION     face    FAMILY HISTORY: Family History  Problem Relation Age of Onset  . Hypertension Mother   . Cervical cancer Mother   . Heart disease Father   . Hypertension Father   . Lung cancer Father   . Lung cancer Sister   . Brain cancer Sister   . Diabetes Brother   . Heart disease Brother   . Hypertension Brother   . Stroke Brother   . Breast cancer Neg Hx     ADVANCED DIRECTIVES (Y/N):  N  HEALTH MAINTENANCE: Social History   Tobacco Use  . Smoking status: Never Smoker  . Smokeless tobacco: Never  Used  Vaping Use  . Vaping Use: Never used  Substance Use Topics  . Alcohol use: No  . Drug use: No     Colonoscopy:  PAP:  Bone density:  Lipid panel:  Allergies  Allergen Reactions  . Gabapentin Swelling  . Quinine Nausea Only and Nausea And Vomiting  . Amoxicillin Itching and Rash    Has patient had a PCN reaction causing immediate rash, facial/tongue/throat swelling,  SOB or lightheadedness with hypotension: No Has patient had a PCN reaction causing severe rash involving mucus membranes or skin necrosis: No Has patient had a PCN reaction that required hospitalization No Has patient had a PCN reaction occurring within the last 10 years: No If all of the above answers are "NO", then may proceed with Cephalosporin use.   . Dilaudid  [Hydromorphone Hcl] Rash  . Etodolac Rash  . Hydrochlorothiazide Rash  . Hydrocodone Rash  . Hydromorphone Rash  . Sulfa Antibiotics Rash    Current Outpatient Medications  Medication Sig Dispense Refill  . acetaminophen (TYLENOL) 500 MG tablet Take 500 mg by mouth every 6 (six) hours as needed for mild pain or moderate pain.    [name] atorvastatin (LIPITOR) 10 MG tablet Take 10 mg by mouth daily at 6 PM.     . Cholecalciferol (VITAMIN D) 2000 units CAPS Take 2,000 Units by mouth daily.     . furosemide (LASIX) 20 MG tablet TAKE 1 TABLET BY MOUTH EVERY DAY 30 tablet 3  . glipiZIDE (GLUCOTROL XL) 5 MG 24 hr tablet TAKE 1 TABLET BY MOUTH DAILY 90 tablet 3  . glucose blood (ONETOUCH VERIO) test strip Use as instructed; Check blood sugar twice daily 100 each 12  . lisinopril-hydrochlorothiazide (ZESTORETIC) 20-12.5 MG tablet TAKE 2 TABLETS BY MOUTH EVERY MORNING 180 tablet 1  . Magnesium 500 MG CAPS Take 1 capsule (500 mg total) by mouth at bedtime. 30 capsule 5  . metoprolol succinate (TOPROL-XL) 100 MG 24 hr tablet TAKE 1 TABLET BY MOUTH DAILY WITH OR IMMEDIATELY FOLLOWING A MEAL 90 tablet 1  . pioglitazone (ACTOS) 45 MG tablet TAKE 1 TABLET(45 MG) BY MOUTH DAILY 30 tablet 11  . sitaGLIPtin-metformin ([name]) 50-1000 MG tablet TAKE 1 TABLET BY MOUTH TWICE DAILY WITH A MEAL 180 tablet 3  . tamoxifen (NOLVADEX) 20 MG tablet TAKE 1 TABLET(20 MG) BY MOUTH DAILY 90 tablet 3   No current facility-administered medications for this visit.    OBJECTIVE: Vitals:   10/17/19 1049  BP: (!) 155/46  Pulse: (!) 54  Resp: 20  Temp: (!)  97.4 F (36.3 C)  SpO2: 100%     Body mass index is 43.84 kg/m.    ECOG FS:1 - Symptomatic but completely ambulatory  General: Well-developed, well-nourished, no acute distress.  Sitting in a wheelchair. Eyes: Pink conjunctiva, anicteric sclera. HEENT: Normocephalic, moist mucous membranes. Lungs: No audible wheezing or coughing. Heart: Regular rate and rhythm. Abdomen: Soft, nontender, no obvious distention. Musculoskeletal: No edema, cyanosis, or clubbing. Neuro: Alert, answering all questions appropriately. Cranial nerves grossly intact. Skin: No rashes or petechiae noted. Psych: Normal affect.   LAB RESULTS:  Lab Results  Component Value Date   NA 140 05/30/2019   K 4.1 05/30/2019   CL 105 05/30/2019   CO2 20 05/30/2019   GLUCOSE 168 (H) 05/30/2019   BUN 21 05/30/2019   CREATININE 1.21 (H) 05/30/2019   CALCIUM 8.8 05/30/2019   PROT 6.2 01/29/2019   ALBUMIN 3.7 05/30/2019   AST 15 01/29/2019  ALT 8 09/18/2017   ALKPHOS 30 (L) 01/29/2019   BILITOT 0.5 01/29/2019   GFRNONAA 45 (L) 05/30/2019   GFRAA 52 (L) 05/30/2019    Lab Results  Component Value Date   WBC 7.7 05/30/2019   NEUTROABS 5.0 05/30/2019   HGB 10.8 (L) 05/30/2019   HCT 33.8 (L) 05/30/2019   MCV 87 05/30/2019   PLT 194 05/30/2019     STUDIES: No results found.  ASSESSMENT: Right breast high grade DCIS.  PLAN:    1. Right breast high grade DCIS: Final pathology was reviewed independently as also discussed at breast case conference. Patient had close margins less than 0.5 mm and was given the option for reexcision to obtain better margins or proceed directly with XRT. She declined reexcision and subsequently underwent adjuvant XRT which she completed in May 2018.  Continue tamoxifen for a total of 5 years completing treatment in May 2023.  Her most recent mammogram on June 27, 2019 was reported as BI-RADS 2, repeat in February 2022.  Return to clinic in 6 months with video assisted  telemedicine visit.   2.  Hypertension: Chronic and unchanged.  Continue monitoring and treatment per primary care.  3.  Renal insufficiency: Patient's most recent creatinine on January 28, 2021Was reported at 1.21.  Monitor.   4.  Anemia: Chronic and unchanged.  Patient expressed understanding and was in agreement with this plan. She also understands that She can call clinic at any time with any questions, concerns, or complaints.   Cancer Staging Ductal carcinoma in situ (DCIS) of right breast Staging form: Breast, AJCC 8th Edition - Pathologic stage from 08/02/2016: Stage 0 (pTis (DCIS), pN0, cM0, ER: Positive, PR: Positive, HER2: Negative) - Signed by Lloyd Huger, MD on 08/02/2016   Lloyd Huger, MD   10/18/2019 7:07 AM

## 2019-10-16 ENCOUNTER — Encounter: Payer: Self-pay | Admitting: Oncology

## 2019-10-17 ENCOUNTER — Inpatient Hospital Stay: Payer: Medicare Other | Attending: Oncology | Admitting: Oncology

## 2019-10-17 ENCOUNTER — Other Ambulatory Visit: Payer: Self-pay

## 2019-10-17 ENCOUNTER — Encounter: Payer: Self-pay | Admitting: Oncology

## 2019-10-17 VITALS — BP 155/46 | HR 54 | Temp 97.4°F | Resp 20 | Wt 247.5 lb

## 2019-10-17 DIAGNOSIS — Z6841 Body Mass Index (BMI) 40.0 and over, adult: Secondary | ICD-10-CM | POA: Insufficient documentation

## 2019-10-17 DIAGNOSIS — Z79810 Long term (current) use of selective estrogen receptor modulators (SERMs): Secondary | ICD-10-CM | POA: Insufficient documentation

## 2019-10-17 DIAGNOSIS — Z801 Family history of malignant neoplasm of trachea, bronchus and lung: Secondary | ICD-10-CM | POA: Diagnosis not present

## 2019-10-17 DIAGNOSIS — E669 Obesity, unspecified: Secondary | ICD-10-CM | POA: Diagnosis not present

## 2019-10-17 DIAGNOSIS — E079 Disorder of thyroid, unspecified: Secondary | ICD-10-CM | POA: Diagnosis not present

## 2019-10-17 DIAGNOSIS — D0511 Intraductal carcinoma in situ of right breast: Secondary | ICD-10-CM | POA: Insufficient documentation

## 2019-10-17 DIAGNOSIS — Z170 Estrogen receptor positive status [ER+]: Secondary | ICD-10-CM | POA: Diagnosis not present

## 2019-10-17 DIAGNOSIS — Z7984 Long term (current) use of oral hypoglycemic drugs: Secondary | ICD-10-CM | POA: Insufficient documentation

## 2019-10-17 DIAGNOSIS — Z8249 Family history of ischemic heart disease and other diseases of the circulatory system: Secondary | ICD-10-CM | POA: Diagnosis not present

## 2019-10-17 DIAGNOSIS — E785 Hyperlipidemia, unspecified: Secondary | ICD-10-CM | POA: Insufficient documentation

## 2019-10-17 DIAGNOSIS — I10 Essential (primary) hypertension: Secondary | ICD-10-CM | POA: Insufficient documentation

## 2019-10-17 DIAGNOSIS — E119 Type 2 diabetes mellitus without complications: Secondary | ICD-10-CM | POA: Diagnosis not present

## 2019-10-17 DIAGNOSIS — D649 Anemia, unspecified: Secondary | ICD-10-CM | POA: Diagnosis not present

## 2019-10-17 DIAGNOSIS — Z833 Family history of diabetes mellitus: Secondary | ICD-10-CM | POA: Insufficient documentation

## 2019-10-17 DIAGNOSIS — Z85828 Personal history of other malignant neoplasm of skin: Secondary | ICD-10-CM | POA: Insufficient documentation

## 2019-10-17 DIAGNOSIS — Z79899 Other long term (current) drug therapy: Secondary | ICD-10-CM | POA: Diagnosis not present

## 2019-10-17 DIAGNOSIS — Z923 Personal history of irradiation: Secondary | ICD-10-CM | POA: Insufficient documentation

## 2019-10-17 NOTE — Progress Notes (Signed)
Patient reports no concerns at this time

## 2019-10-24 ENCOUNTER — Other Ambulatory Visit: Payer: Self-pay | Admitting: Family Medicine

## 2019-10-24 DIAGNOSIS — E119 Type 2 diabetes mellitus without complications: Secondary | ICD-10-CM

## 2019-10-24 MED ORDER — GLIPIZIDE ER 5 MG PO TB24: ORAL_TABLET | ORAL | 3 refills | Status: DC

## 2019-10-24 NOTE — Telephone Encounter (Signed)
glipiZIDE (GLUCOTROL XL) 5 MG 24 hr tablet      Patient requesting refills.    Pharmacy:  [facility] [place], [place] AT [facility] OF [address] [facility] Phone:  [phone]  Fax:  [phone]

## 2019-11-11 ENCOUNTER — Other Ambulatory Visit: Payer: Self-pay | Admitting: Family Medicine

## 2019-11-11 DIAGNOSIS — I10 Essential (primary) hypertension: Secondary | ICD-10-CM

## 2019-11-26 ENCOUNTER — Encounter: Payer: Self-pay | Admitting: Radiation Oncology

## 2019-11-26 ENCOUNTER — Other Ambulatory Visit: Payer: Self-pay

## 2019-11-27 ENCOUNTER — Ambulatory Visit
Admission: RE | Admit: 2019-11-27 | Discharge: 2019-11-27 | Disposition: A | Discharge status: Home / Self Care | Payer: Medicare Other | Source: Ambulatory Visit | Attending: Radiation Oncology | Admitting: Radiation Oncology

## 2019-11-27 ENCOUNTER — Encounter: Payer: Self-pay | Admitting: Radiation Oncology

## 2019-11-27 VITALS — BP 137/59 | HR 54 | Temp 97.2°F

## 2019-11-27 DIAGNOSIS — D0511 Intraductal carcinoma in situ of right breast: Secondary | ICD-10-CM

## 2019-11-27 NOTE — Progress Notes (Signed)
Radiation Oncology Follow up Note  Name: Carol Ramsey   Date:   11/27/2019 MRN:  [id number] DOB: 1945/11/30    This 74 y.o. female presents to the clinic today for 3-year follow-up status post whole breast radiation to right breast for ER/PR positive ductal carcinoma in situ.  REFERRING PROVIDER: Jerrol Banana.,*  HPI: Patient is a 74 year old female now out 3 years having pleated whole breast radiation to her right breast for ER/PR positive ductal carcinoma in situ.  She is seen today in routine follow-up and is doing well.  She specifically denies breast tenderness cough or bone pain.  She does continue have multiple problems including significant back pain secondary to arthritis.  She is receiving injections for that..  She had mammograms back in February which I have reviewed were BI-RADS 2 benign.  She is currently on tamoxifen tolerating that well without side effect.  COMPLICATIONS OF TREATMENT: none  FOLLOW UP COMPLIANCE: keeps appointments   PHYSICAL EXAM:  BP (!) 137/59 (BP Location: Left Arm, Patient Position: Sitting, Cuff Size: Large)    Pulse 54    Temp (!) 97.2 F (36.2 C) (Tympanic)  Elderly-appearing wheelchair-bound female in NAD.  Lungs are clear to A&P cardiac examination essentially unremarkable with regular rate and rhythm. No dominant mass or nodularity is noted in either breast in 2 positions examined. Incision is well-healed. No axillary or supraclavicular adenopathy is appreciated. Cosmetic result is excellent.  Well-developed well-nourished patient in NAD. HEENT reveals PERLA, EOMI, discs not visualized.  Oral cavity is clear. No oral mucosal lesions are identified. Neck is clear without evidence of cervical or supraclavicular adenopathy. Lungs are clear to A&P. Cardiac examination is essentially unremarkable with regular rate and rhythm without murmur rub or thrill. Abdomen is benign with no organomegaly or masses noted. Motor sensory and DTR levels are equal  and symmetric in the upper and lower extremities. Cranial nerves II through XII are grossly intact. Proprioception is intact. No peripheral adenopathy or edema is identified. No motor or sensory levels are noted. Crude visual fields are within normal range.  RADIOLOGY RESULTS: Mammograms reviewed compatible with above-stated findings  PLAN: Present time from a breast standpoint she continues to do well with no evidence of disease.  I will see her one more time in a year and then discontinue follow-up care.  She is already scheduled for follow-up mammograms.  She will continue on tamoxifen without side effect.  Patient knows to call with any concerns.  I would like to take this opportunity to thank you for allowing me to participate in the care of your patient.Noreene Filbert, MD

## 2019-12-04 NOTE — Progress Notes (Signed)
This encounter was created in error - please disregard.

## 2019-12-05 ENCOUNTER — Other Ambulatory Visit: Payer: Self-pay

## 2019-12-10 DIAGNOSIS — E782 Mixed hyperlipidemia: Secondary | ICD-10-CM | POA: Diagnosis not present

## 2019-12-12 DIAGNOSIS — M4606 Spinal enthesopathy, lumbar region: Secondary | ICD-10-CM | POA: Diagnosis not present

## 2019-12-30 DIAGNOSIS — H0102B Squamous blepharitis left eye, upper and lower eyelids: Secondary | ICD-10-CM | POA: Diagnosis not present

## 2019-12-30 DIAGNOSIS — E113293 Type 2 diabetes mellitus with mild nonproliferative diabetic retinopathy without macular edema, bilateral: Secondary | ICD-10-CM | POA: Diagnosis not present

## 2019-12-30 DIAGNOSIS — H2513 Age-related nuclear cataract, bilateral: Secondary | ICD-10-CM | POA: Diagnosis not present

## 2019-12-30 DIAGNOSIS — H524 Presbyopia: Secondary | ICD-10-CM | POA: Diagnosis not present

## 2019-12-30 DIAGNOSIS — H0102A Squamous blepharitis right eye, upper and lower eyelids: Secondary | ICD-10-CM | POA: Diagnosis not present

## 2019-12-30 LAB — HM DIABETES EYE EXAM

## 2019-12-31 ENCOUNTER — Encounter: Payer: Self-pay | Admitting: *Deleted

## 2020-01-02 DIAGNOSIS — M67361 Transient synovitis, right knee: Secondary | ICD-10-CM | POA: Diagnosis not present

## 2020-01-09 DIAGNOSIS — M1711 Unilateral primary osteoarthritis, right knee: Secondary | ICD-10-CM | POA: Diagnosis not present

## 2020-01-16 DIAGNOSIS — M1711 Unilateral primary osteoarthritis, right knee: Secondary | ICD-10-CM | POA: Diagnosis not present

## 2020-01-22 ENCOUNTER — Other Ambulatory Visit: Payer: Self-pay | Admitting: Oncology

## 2020-01-27 ENCOUNTER — Other Ambulatory Visit: Payer: Self-pay | Admitting: Family Medicine

## 2020-01-29 ENCOUNTER — Ambulatory Visit: Payer: Self-pay | Admitting: Family Medicine

## 2020-01-30 DIAGNOSIS — M1711 Unilateral primary osteoarthritis, right knee: Secondary | ICD-10-CM | POA: Diagnosis not present

## 2020-01-30 DIAGNOSIS — Z23 Encounter for immunization: Secondary | ICD-10-CM | POA: Diagnosis not present

## 2020-02-06 DIAGNOSIS — M1711 Unilateral primary osteoarthritis, right knee: Secondary | ICD-10-CM | POA: Diagnosis not present

## 2020-02-13 DIAGNOSIS — M1711 Unilateral primary osteoarthritis, right knee: Secondary | ICD-10-CM | POA: Diagnosis not present

## 2020-02-16 ENCOUNTER — Other Ambulatory Visit: Payer: Self-pay | Admitting: Family Medicine

## 2020-02-16 DIAGNOSIS — I10 Essential (primary) hypertension: Secondary | ICD-10-CM

## 2020-02-18 NOTE — Progress Notes (Signed)
I,[name],acting as a scribe for [name], MD.,have documented all relevant documentation on the behalf of [name], MD,as directed by  [name], MD while in the presence of [name], MD.   Established patient visit   Patient: Carol Ramsey   DOB: Dec 04, 1945   74 y.o. Female  MRN: [id number] Visit Date: 02/19/2020  Today's healthcare provider: [name], MD   Chief Complaint  Patient presents with   Diabetes   Follow-up   Hyperlipidemia   Hypertension   Subjective    HPI  Patient continues to complain of chronic back and lower extremity pain.  She is very pleased with that she has lost 9 pounds with dietary changes since her last visit. Diabetes Mellitus Type II, follow-up  Lab Results  Component Value Date   HGBA1C 8.3 (A) 09/26/2019   HGBA1C 9.5 (H) 05/30/2019   HGBA1C 8.3 (A) 01/29/2019   Last seen for diabetes 5 months ago.  Management since then includes continuing the same treatment. She reports good compliance with treatment. She is not having side effects. none  Home blood sugar records: fasting range: 95-120  Episodes of hypoglycemia? No none   Current insulin regiment: n/a Most Recent Eye Exam: 12/30/2019  --------------------------------------------------------------------  Hypertension, follow-up  BP Readings from Last 3 Encounters:  02/19/20 (!) 150/68  11/27/19 (!) 137/59  10/17/19 (!) 155/46   Wt Readings from Last 3 Encounters:  02/19/20 239 lb (108.4 kg)  10/17/19 247 lb 8 oz (112.3 kg)  09/26/19 244 lb (110.7 kg)     She was last seen for hypertension 5 months ago.  BP at that visit was 143/74. Management since that visit includes; On lisinopril/HCT. She reports good compliance with treatment. She is not having side effects. none She is exercising. She is adherent to low salt diet.   Outside blood pressures are not checking.  She does not smoke.  Use of agents associated  with hypertension: none.   --------------------------------------------------------------------  Lipid/Cholesterol, follow-up  Last Lipid Panel: Lab Results  Component Value Date   CHOL 102 01/29/2019   LDLCALC 33 01/29/2019   HDL 47 01/29/2019   TRIG 126 01/29/2019    She was last seen for this 1 years ago.  Management since that visit includes; on atorvastatin.  She reports good compliance with treatment. She is not having side effects. none She is following a Regular, Low Sodium diet. Current exercise: no regular exercise  Last metabolic panel Lab Results  Component Value Date   GLUCOSE 168 (H) 05/30/2019   NA 140 05/30/2019   K 4.1 05/30/2019   BUN 21 05/30/2019   CREATININE 1.21 (H) 05/30/2019   GFRNONAA 45 (L) 05/30/2019   GFRAA 52 (L) 05/30/2019   CALCIUM 8.8 05/30/2019   AST 15 01/29/2019   ALT 8 09/18/2017   The ASCVD Risk score Mikey Bussing DC Jr., et al., 2013) failed to calculate for the following reasons:   The valid total cholesterol range is 130 to 320 mg/dL  --------------------------------------------------------------------      Medications: Outpatient Medications Prior to Visit  Medication Sig   acetaminophen (TYLENOL) 500 MG tablet Take 500 mg by mouth every 6 (six) hours as needed for mild pain or moderate pain.   atorvastatin (LIPITOR) 10 MG tablet Take 10 mg by mouth daily at 6 PM.    Cholecalciferol (VITAMIN D) 2000 units CAPS Take 2,000 Units by mouth daily.    furosemide (LASIX) 20 MG tablet TAKE  1 TABLET BY MOUTH EVERY DAY   glipiZIDE (GLUCOTROL XL) 5 MG 24 hr tablet TAKE 1 TABLET BY MOUTH DAILY   glucose blood (ONETOUCH VERIO) test strip Use as instructed; Check blood sugar twice daily   lisinopril-hydrochlorothiazide (ZESTORETIC) 20-12.5 MG tablet TAKE 2 TABLETS BY MOUTH EVERY MORNING   metoprolol succinate (TOPROL-XL) 100 MG 24 hr tablet TAKE 1 TABLET BY MOUTH DAILY WITH OR IMMEDIATELY FOLLOWING A MEAL   pioglitazone (ACTOS) 45 MG  tablet TAKE 1 TABLET(45 MG) BY MOUTH DAILY   sitaGLIPtin-metformin ([name]) 50-1000 MG tablet TAKE 1 TABLET BY MOUTH TWICE DAILY WITH A MEAL   tamoxifen (NOLVADEX) 20 MG tablet TAKE 1 TABLET(20 MG) BY MOUTH DAILY   Magnesium 500 MG CAPS Take 1 capsule (500 mg total) by mouth at bedtime.   No facility-administered medications prior to visit.    Review of Systems  Constitutional: Negative for appetite change, chills, fatigue and fever.  Respiratory: Negative for chest tightness and shortness of breath.   Cardiovascular: Negative for chest pain and palpitations.  Gastrointestinal: Negative for abdominal pain, nausea and vomiting.  Neurological: Negative for dizziness and weakness.    Last hemoglobin A1c Lab Results  Component Value Date   HGBA1C 7.6 (H) 02/19/2020      Objective    BP (!) 150/68 (BP Location: Right Arm, Patient Position: Sitting, Cuff Size: Large)    Pulse (!) 52    Temp 98.5 F (36.9 C) (Oral)    Resp 18    Ht 5\' 3"  (1.6 m)    Wt 239 lb (108.4 kg)    SpO2 98%    BMI 42.34 kg/m  BP Readings from Last 3 Encounters:  02/19/20 (!) 150/68  11/27/19 (!) 137/59  10/17/19 (!) 155/46   Wt Readings from Last 3 Encounters:  02/19/20 239 lb (108.4 kg)  10/17/19 247 lb 8 oz (112.3 kg)  09/26/19 244 lb (110.7 kg)      Physical Exam Vitals reviewed.  Constitutional:      Appearance: She is well-developed. She is obese.     Comments: Obese WF NAD.  HENT:     Head: Normocephalic and atraumatic.     Right Ear: External ear normal.     Left Ear: External ear normal.     Nose: Nose normal.  Eyes:     General: No scleral icterus.    Conjunctiva/sclera: Conjunctivae normal.  Neck:     Thyroid: No thyromegaly.  Cardiovascular:     Rate and Rhythm: Normal rate and regular rhythm.     Heart sounds: Normal heart sounds.  Pulmonary:     Effort: Pulmonary effort is normal.     Breath sounds: Normal breath sounds.  Abdominal:     Palpations: Abdomen is soft.    Musculoskeletal:     Comments: She has chronic lower extremity lymphedema.  Skin:    General: Skin is warm and dry.     Comments: Very fair skin.  Neurological:     General: No focal deficit present.     Mental Status: She is alert and oriented to person, place, and time.  Psychiatric:        Mood and Affect: Mood normal.        Behavior: Behavior normal.        Thought Content: Thought content normal.        Judgment: Judgment normal.       No results found for any visits on 02/19/20.  Assessment & Plan  1. Type 2 diabetes mellitus with complication, without long-term current use of insulin ([name]) Trolled on pioglitazone [name] and glipizide.  Consider stopping glipizide in the future in this 74 year old - Hemoglobin A1c  2. Essential (primary) hypertension Good control on Zestoretic and Toprol - CBC w/Diff/Platelet - Comprehensive Metabolic Panel (CMET)  3. Acquired hypothyroidism  - TSH  4. Pure hypercholesterolemia Goal A1c less than 70 - Lipid panel - TSH  5. Type 2 diabetes mellitus without complication, without long-term current use of insulin ([name])   6. Lumbar spondylosis   7. Lumbar facet arthropathy (Bilateral) Her pain clinic  8. Lymphedema   9. Ductal carcinoma in situ (DCIS) of right breast   10. Extreme obesity Patient working on dietary changes.  She has lost 9 pounds.   No follow-ups on file.         Raynard Mapps Cranford Mon, MD  [facility] [phone] (phone) [phone] (fax)  [place]

## 2020-02-19 ENCOUNTER — Other Ambulatory Visit: Payer: Self-pay

## 2020-02-19 ENCOUNTER — Encounter: Payer: Self-pay | Admitting: Family Medicine

## 2020-02-19 ENCOUNTER — Ambulatory Visit (INDEPENDENT_AMBULATORY_CARE_PROVIDER_SITE_OTHER): Payer: Medicare Other | Admitting: Family Medicine

## 2020-02-19 VITALS — BP 150/68 | HR 52 | Temp 98.5°F | Resp 18 | Ht 63.0 in | Wt 239.0 lb

## 2020-02-19 DIAGNOSIS — I10 Essential (primary) hypertension: Secondary | ICD-10-CM

## 2020-02-19 DIAGNOSIS — M47816 Spondylosis without myelopathy or radiculopathy, lumbar region: Secondary | ICD-10-CM | POA: Diagnosis not present

## 2020-02-19 DIAGNOSIS — I890 Lymphedema, not elsewhere classified: Secondary | ICD-10-CM

## 2020-02-19 DIAGNOSIS — E7800 Pure hypercholesterolemia, unspecified: Secondary | ICD-10-CM

## 2020-02-19 DIAGNOSIS — E119 Type 2 diabetes mellitus without complications: Secondary | ICD-10-CM | POA: Diagnosis not present

## 2020-02-19 DIAGNOSIS — E039 Hypothyroidism, unspecified: Secondary | ICD-10-CM | POA: Diagnosis not present

## 2020-02-19 DIAGNOSIS — E668 Other obesity: Secondary | ICD-10-CM | POA: Diagnosis not present

## 2020-02-19 DIAGNOSIS — D0511 Intraductal carcinoma in situ of right breast: Secondary | ICD-10-CM | POA: Diagnosis not present

## 2020-02-19 DIAGNOSIS — E669 Obesity, unspecified: Secondary | ICD-10-CM

## 2020-02-19 DIAGNOSIS — E118 Type 2 diabetes mellitus with unspecified complications: Secondary | ICD-10-CM

## 2020-02-20 LAB — COMPREHENSIVE METABOLIC PANEL
ALT: 8 IU/L (ref 0–32)
AST: 15 IU/L (ref 0–40)
Albumin/Globulin Ratio: 1.4 (ref 1.2–2.2)
Albumin: 3.9 g/dL (ref 3.7–4.7)
Alkaline Phosphatase: 53 IU/L (ref 44–121)
BUN/Creatinine Ratio: 16 (ref 12–28)
BUN: 19 mg/dL (ref 8–27)
Bilirubin Total: 0.5 mg/dL (ref 0.0–1.2)
CO2: 21 mmol/L (ref 20–29)
Calcium: 8.9 mg/dL (ref 8.7–10.3)
Chloride: 100 mmol/L (ref 96–106)
Creatinine, Ser: 1.2 mg/dL — ABNORMAL HIGH (ref 0.57–1.00)
GFR calc Af Amer: 52 mL/min/{1.73_m2} — ABNORMAL LOW (ref >59)
GFR calc non Af Amer: 45 mL/min/{1.73_m2} — ABNORMAL LOW (ref >59)
Globulin, Total: 2.7 g/dL (ref 1.5–4.5)
Glucose: 170 mg/dL — ABNORMAL HIGH (ref 65–99)
Potassium: 4.5 mmol/L (ref 3.5–5.2)
Sodium: 135 mmol/L (ref 134–144)
Total Protein: 6.6 g/dL (ref 6.0–8.5)

## 2020-02-20 LAB — LIPID PANEL
Chol/HDL Ratio: 2 ratio (ref 0.0–4.4)
Cholesterol, Total: 121 mg/dL (ref 100–199)
HDL: 61 mg/dL (ref >39)
LDL Chol Calc (NIH): 42 mg/dL (ref 0–99)
Triglycerides: 97 mg/dL (ref 0–149)
VLDL Cholesterol Cal: 18 mg/dL (ref 5–40)

## 2020-02-20 LAB — CBC WITH DIFFERENTIAL/PLATELET
Basophils Absolute: 0.1 10*3/uL (ref 0.0–0.2)
Basos: 1 %
EOS (ABSOLUTE): 0.3 10*3/uL (ref 0.0–0.4)
Eos: 3 %
Hematocrit: 35.5 % (ref 34.0–46.6)
Hemoglobin: 11.7 g/dL (ref 11.1–15.9)
Immature Grans (Abs): 0 10*3/uL (ref 0.0–0.1)
Immature Granulocytes: 0 %
Lymphocytes Absolute: 2 10*3/uL (ref 0.7–3.1)
Lymphs: 22 %
MCH: 29.3 pg (ref 26.6–33.0)
MCHC: 33 g/dL (ref 31.5–35.7)
MCV: 89 fL (ref 79–97)
Monocytes Absolute: 0.7 10*3/uL (ref 0.1–0.9)
Monocytes: 8 %
Neutrophils Absolute: 6.1 10*3/uL (ref 1.4–7.0)
Neutrophils: 66 %
Platelets: 200 10*3/uL (ref 150–450)
RBC: 4 x10E6/uL (ref 3.77–5.28)
RDW: 12.5 % (ref 11.7–15.4)
WBC: 9.2 10*3/uL (ref 3.4–10.8)

## 2020-02-20 LAB — HEMOGLOBIN A1C
Est. average glucose Bld gHb Est-mCnc: 171 mg/dL
Hgb A1c MFr Bld: 7.6 % — ABNORMAL HIGH (ref 4.8–5.6)

## 2020-02-20 LAB — TSH: TSH: 4.8 u[IU]/mL — ABNORMAL HIGH (ref 0.450–4.500)

## 2020-02-24 DIAGNOSIS — C44329 Squamous cell carcinoma of skin of other parts of face: Secondary | ICD-10-CM | POA: Diagnosis not present

## 2020-02-24 DIAGNOSIS — X32XXXA Exposure to sunlight, initial encounter: Secondary | ICD-10-CM | POA: Diagnosis not present

## 2020-02-24 DIAGNOSIS — D225 Melanocytic nevi of trunk: Secondary | ICD-10-CM | POA: Diagnosis not present

## 2020-02-24 DIAGNOSIS — D485 Neoplasm of uncertain behavior of skin: Secondary | ICD-10-CM | POA: Diagnosis not present

## 2020-02-24 DIAGNOSIS — D2262 Melanocytic nevi of left upper limb, including shoulder: Secondary | ICD-10-CM | POA: Diagnosis not present

## 2020-02-24 DIAGNOSIS — L570 Actinic keratosis: Secondary | ICD-10-CM | POA: Diagnosis not present

## 2020-02-24 DIAGNOSIS — D2261 Melanocytic nevi of right upper limb, including shoulder: Secondary | ICD-10-CM | POA: Diagnosis not present

## 2020-02-24 DIAGNOSIS — Z85828 Personal history of other malignant neoplasm of skin: Secondary | ICD-10-CM | POA: Diagnosis not present

## 2020-02-24 DIAGNOSIS — D2271 Melanocytic nevi of right lower limb, including hip: Secondary | ICD-10-CM | POA: Diagnosis not present

## 2020-02-24 DIAGNOSIS — D0471 Carcinoma in situ of skin of right lower limb, including hip: Secondary | ICD-10-CM | POA: Diagnosis not present

## 2020-03-11 ENCOUNTER — Telehealth: Payer: Self-pay | Admitting: *Deleted

## 2020-03-11 NOTE — Chronic Care Management (AMB) (Signed)
  Chronic Care Management   Outreach Note  03/11/2020 Name: Carol Ramsey MRN: [id number] DOB: April 06, 1946  Carol Ramsey is a 74 y.o. year old female who is a primary care patient of Jerrol Banana., MD. I reached out to Carol Ramsey by phone today in response to a referral sent by Carol Ramsey health plan.     An unsuccessful telephone outreach was attempted today. The patient was referred to the case management team for assistance with care management and care coordination.   Follow Up Plan: A HIPAA compliant phone message was left for the patient providing contact information and requesting a return call. The care management team will reach out to the patient again over the next 7 days. If patient returns call to provider office, please advise to call [place] at [phone].  [place]  Direct Dial: [phone]

## 2020-03-11 NOTE — Chronic Care Management (AMB) (Signed)
  Chronic Care Management   Note  03/11/2020 Name: Carol Ramsey MRN: [id number] DOB: [birth year]  Carol Ramsey is a 74 y.o. year old female who is a primary care patient of Jerrol Banana., MD. I reached out to Susen Hampton Abbot by phone today in response to a referral sent by Carol Ramsey's health plan.     [name] was given information about Chronic Care Management services today including:  1. CCM service includes personalized support from designated clinical staff supervised by her physician, including individualized plan of care and coordination with other care providers 2. 24/7 contact phone numbers for assistance for urgent and routine care needs. 3. Service will only be billed when office clinical staff spend 20 minutes or more in a month to coordinate care. 4. Only one practitioner may furnish and bill the service in a calendar month. 5. The patient may stop CCM services at any time (effective at the end of the month) by phone call to the office staff. 6. The patient will be responsible for cost sharing (co-pay) of up to 20% of the service fee (after annual deductible is met).  Patient agreed to services and verbal consent obtained.   Follow up plan: Telephone appointment with care management team member scheduled for: 04/02/2020  [place] [facility]  Direct Dial [phone]

## 2020-03-25 DIAGNOSIS — M4606 Spinal enthesopathy, lumbar region: Secondary | ICD-10-CM | POA: Diagnosis not present

## 2020-04-02 ENCOUNTER — Ambulatory Visit: Payer: Medicare Other

## 2020-04-02 DIAGNOSIS — E118 Type 2 diabetes mellitus with unspecified complications: Secondary | ICD-10-CM

## 2020-04-02 DIAGNOSIS — I10 Essential (primary) hypertension: Secondary | ICD-10-CM

## 2020-04-02 DIAGNOSIS — M5442 Lumbago with sciatica, left side: Secondary | ICD-10-CM

## 2020-04-02 DIAGNOSIS — G8929 Other chronic pain: Secondary | ICD-10-CM

## 2020-04-02 NOTE — Chronic Care Management (AMB) (Signed)
Chronic Care Management   Initial Visit Note  04/02/2020 Name: Kersti L Tison MRN: [id number] DOB: 02/01/1946  Primary Care Provider: Jerrol Banana., MD Reason for referral : Chronic Case Management  Ardell L Andre is a 74 y.o. year old female who is a primary care patient of Jerrol Banana., MD. The CCM team was consulted for assistance with chronic disease management and care coordination. The initial assessment was conducted today.  Review of Mrs. Keady's status, including review of consultants reports, relevant labs and test results was conducted today. Collaboration with appropriate care team members was performed as part of the comprehensive evaluation and provision of chronic care management services.    SDOH (Social Determinants of Health) assessments performed: Yes SDOH Interventions     Most Recent Value  SDOH Interventions  Food Insecurity Interventions Intervention Not Indicated  Transportation Interventions Intervention Not Indicated        Medications: Outpatient Encounter Medications as of 04/02/2020  Medication Sig Note  . acetaminophen (TYLENOL) 500 MG tablet Take 500 mg by mouth every 6 (six) hours as needed for mild pain or moderate pain.   [name] atorvastatin (LIPITOR) 10 MG tablet Take 10 mg by mouth daily at 6 PM.  09/17/2014: Received from: [facility]  . Cholecalciferol (VITAMIN D) 2000 units CAPS Take 2,000 Units by mouth daily.  04/02/2020: Reports taking 5000 IU  . glipiZIDE (GLUCOTROL XL) 5 MG 24 hr tablet TAKE 1 TABLET BY MOUTH DAILY   . lisinopril-hydrochlorothiazide (ZESTORETIC) 20-12.5 MG tablet TAKE 2 TABLETS BY MOUTH EVERY MORNING   . metoprolol succinate (TOPROL-XL) 100 MG 24 hr tablet TAKE 1 TABLET BY MOUTH DAILY WITH OR IMMEDIATELY FOLLOWING A MEAL   . sitaGLIPtin-metformin ([name]) 50-1000 MG tablet TAKE 1 TABLET BY MOUTH TWICE DAILY WITH A MEAL   . tamoxifen (NOLVADEX) 20 MG tablet TAKE 1 TABLET(20 MG) BY MOUTH DAILY     . furosemide (LASIX) 20 MG tablet TAKE 1 TABLET BY MOUTH EVERY DAY 04/02/2020: Reports taking as needed for edema  . glucose blood (ONETOUCH VERIO) test strip Use as instructed; Check blood sugar twice daily   . Magnesium 500 MG CAPS Take 1 capsule (500 mg total) by mouth at bedtime.   . pioglitazone (ACTOS) 45 MG tablet TAKE 1 TABLET(45 MG) BY MOUTH DAILY (Patient not taking: Reported on 04/02/2020) 04/02/2020: Reports not taking for several months.   No facility-administered encounter medications on file as of 04/02/2020.     Objective:  BP Readings from Last 3 Encounters:  02/19/20 (!) 150/68  11/27/19 (!) 137/59  10/17/19 (!) 155/46   Lab Results  Component Value Date   HGBA1C 7.6 (H) 02/19/2020    Goals Addressed            This Visit's Progress   . Chronic Disease Management       CARE PLAN ENTRY (see longitudinal plan of care for additional care plan information)  Current Barriers:  . Chronic Disease Management support and education needs related to Hypertension, Diabetes and Chronic back pain.  Nurse Case Manager Clinical Goal(s):  Over the next 120 days, patient: . Will monitor fasting blood glucose levels daily and maintain a log.  . Will adhere to diabetes self-management recommendations and decrease A1C by 1 point. . Will monitor BP routinely and record readings. . Will take all medications as prescribed. . Will attend medical appointments as scheduled. . Will follow safety recommendations to prevent falls and injuries. Over the next 30 days,  patient: . Will a follow-up with Pain Management provider if knee pain worsens.  Interventions:  . Inter-disciplinary care team collaboration (see longitudinal plan of care) . Reviewed medications. Encouraged to take all medications as prescribed and notify provider if unable to tolerate. Encouraged to keep care management team updated of concerns regarding medication management or prescription costs. Expressed concerns  regarding the cost of [name]. Agreeable to outreach with the CCM Pharmacist regarding available medication assistance.  . Discussed s/sx of hyperglycemia and hypoglycemia along with recommended interventions. Discussed fasting blood glucose levels. Encouraged to monitor daily and maintain a log. Reports not monitoring fasting levels daily.  Reports most fasting readings have ranged from 110 to the 120's. She recalls an elevated reading of 263 mg/dl on yesterday. Thinks it was likely d/t eating candy. Feels that she is doing fairly well with maintaining a heart healthy/diabetic diet. Most recent A1C was 7/6%.  . Provided information regarding established BP ranges. Encouraged to monitor routinely if unable to monitor daily and record readings.   . Provided information regarding safety and fall prevention measures. Discussed fall risk and limitations d/t chronic back pain and joint stiffness. Encouraged to use an assistive device when needed to prevent accidental falls and injuries. Advised to avoid prolonged and over strenuous activities. Currently working with the Pain Management Clinic in [facility] and receiving injections to her knee and lower back. Reports experiencing moderate relief following the last injections. She plans to follow-up if her knee pain worsens. Denies recent falls   . Reviewed pending/scheduled appointments. Encouraged to attend medical appointments as scheduled to prevent delays in care. Encouraged to notify the care management team with concerns regarding transportation.  . Discussed plans for ongoing care management follow up. Provided direct contact information for the CCM Nurse Case Manager. Overall, feels that she is doing well. Remains independent in the home. Her spouse and son are available to assist if needed. Denies urgent concerns. Will submit request for outreach with the CCM Pharmacist and follow-up next month.    Patient Self Care Activities:  . Self administers  medications as prescribed . Attend all scheduled provider appointments . Call pharmacy for medication refills . Perform ADL's independently . Call provider office for new concerns or questions   Initial goal documentation        Ms. Bolte was given information about Chronic Care Management services including:  1. CCM service includes personalized support from designated clinical staff supervised by her physician, including individualized plan of care and coordination with other care providers 2. 24/7 contact phone numbers for assistance for urgent and routine care needs. 3. Service will only be billed when office clinical staff spend 20 minutes or more in a month to coordinate care. 4. Only one practitioner may furnish and bill the service in a calendar month. 5. The patient may stop CCM services at any time (effective at the end of the month) by phone call to the office staff. 6. The patient will be responsible for cost sharing (co-pay) of up to 20% of the service fee (after annual deductible is met).  Patient agreed to services and verbal consent obtained.       PLAN A member of the care management team will follow-up with Mrs. Saintil next month.     [name] Care Management [facility] [phone]

## 2020-04-06 ENCOUNTER — Telehealth: Payer: Self-pay | Admitting: *Deleted

## 2020-04-06 NOTE — Patient Instructions (Signed)
Thank you for allowing the Chronic Care Management team to participate in your care.   Goals Addressed            This Visit's Progress   . Chronic Disease Management       CARE PLAN ENTRY (see longitudinal plan of care for additional care plan information)  Current Barriers:  . Chronic Disease Management support and education needs related to Hypertension, Diabetes and Chronic back pain.  Nurse Case Manager Clinical Goal(s):  Over the next 120 days, patient: . Will monitor fasting blood glucose levels daily and maintain a log.  . Will adhere to diabetes self-management recommendations and decrease A1C by 1 point. . Will monitor BP routinely and record readings. . Will take all medications as prescribed. . Will attend medical appointments as scheduled. . Will follow safety recommendations to prevent falls and injuries. Over the next 30 days, patient: . Will a follow-up with Pain Management provider if knee pain worsens.  Interventions:  . Inter-disciplinary care team collaboration (see longitudinal plan of care) . Reviewed medications. Encouraged to take all medications as prescribed and notify provider if unable to tolerate. Encouraged to keep care management team updated of concerns regarding medication management or prescription costs. Expressed concerns regarding the cost of [name]. Agreeable to outreach with the CCM Pharmacist regarding available medication assistance.  . Discussed s/sx of hyperglycemia and hypoglycemia along with recommended interventions. Discussed fasting blood glucose levels. Encouraged to monitor daily and maintain a log. Reports not monitoring fasting levels daily.  Reports most fasting readings have ranged from 110 to the 120's. She recalls an elevated reading of 263 mg/dl on yesterday. Thinks it was likely d/t eating candy. Feels that she is doing fairly well with maintaining a heart healthy/diabetic diet. Most recent A1C was 7/6%.  . Provided  information regarding established BP ranges. Encouraged to monitor routinely if unable to monitor daily and record readings.   . Provided information regarding safety and fall prevention measures. Discussed fall risk and limitations d/t chronic back pain and joint stiffness. Encouraged to use an assistive device when needed to prevent accidental falls and injuries. Advised to avoid prolonged and over strenuous activities. Currently working with the Pain Management Clinic in [facility] and receiving injections to her knee and lower back. Reports experiencing moderate relief following the last injections. She plans to follow-up if her knee pain worsens. Denies recent falls   . Reviewed pending/scheduled appointments. Encouraged to attend medical appointments as scheduled to prevent delays in care. Encouraged to notify the care management team with concerns regarding transportation.  . Discussed plans for ongoing care management follow up. Provided direct contact information for the CCM Nurse Case Manager. Overall, feels that she is doing well. Remains independent in the home. Her spouse and son are available to assist if needed. Denies urgent concerns. Will submit request for outreach with the CCM Pharmacist and follow-up next month.    Patient Self Care Activities:  . Self administers medications as prescribed . Attend all scheduled provider appointments . Call pharmacy for medication refills . Perform ADL's independently . Call provider office for new concerns or questions   Initial goal documentation         Ms. Kaas was given information about Chronic Care Management services including:  1. CCM service includes personalized support from designated clinical staff supervised by her physician, including individualized plan of care and coordination with other care providers 2. 24/7 contact phone numbers for assistance for urgent and routine care  needs. 3. Service will only be billed when  office clinical staff spend 20 minutes or more in a month to coordinate care. 4. Only one practitioner may furnish and bill the service in a calendar month. 5. The patient may stop CCM services at any time (effective at the end of the month) by phone call to the office staff. 6. The patient will be responsible for cost sharing (co-pay) of up to 20% of the service fee (after annual deductible is met).  Patient agreed to services and verbal consent obtained.    Mrs. Ebarb verbalized understanding of the information discussed during the telephonic outreach today. Declined need for a mailed/printed copy of the instructions.   A member of the care management team will follow-up with Mrs. Pourciau next month.     [name] Care Management [facility] [phone]

## 2020-04-06 NOTE — Chronic Care Management (AMB) (Unsigned)
  Care Management   Note  04/06/2020 Name: [name] MRN: [id number] DOB: 1945-06-25  Carol Ramsey is a 74 y.o. year old female who is a primary care patient of Jerrol Banana., MD and is actively engaged with the care management team. I reached out to Ardra Hampton Abbot by phone today to assist with scheduling an initial visit with the Pharmacist.  Follow up plan: Unsuccessful telephone outreach attempt made. A HIPAA compliant phone message was left for the patient providing contact information and requesting a return call.  The care management team will reach out to the patient again over the next 7 days.  If patient returns call to provider office, please advise to call [place] at [phone].  [place] Management

## 2020-04-07 NOTE — Chronic Care Management (AMB) (Signed)
  Care Management   Note  04/07/2020 Name: [name] MRN: [id number] DOB: [birth year]  Carol Ramsey is a 74 y.o. year old female who is a primary care patient of Jerrol Banana., MD and is actively engaged with the care management team. I reached out to Krina Hampton Abbot by phone today to assist with scheduling an initial visit with the Pharmacist  Follow up plan: Telephone appointment with care management team member scheduled for:04/28/2020  [place] Management

## 2020-04-19 NOTE — Progress Notes (Signed)
[place]  [phone] [phone]  ID: Leilene L Sublett OB: 04-Dec-1945  MR#: [id number]  OIZ#:[id number]  Patient Care Team: Jerrol Banana., MD as PCP - General (Family Medicine) Marry Guan, Laurice Record, MD as Consulting Physician (Orthopedic Surgery) Dasher, Rayvon Char, MD as Consulting Physician (Dermatology) Corey Skains, MD as Consulting Physician (Cardiology) Thelma Comp, [place] as Consulting Physician (Optometry) Byrnett, Forest Gleason, MD (General Surgery) Noreene Filbert, MD as Referring Physician (Radiation Oncology) Lloyd Huger, MD as Consulting Physician (Oncology) Neldon Labella, RN as Registered Nurse Germaine Pomfret, [facility] (Pharmacist)  I connected with Carol Ramsey on 04/23/20 at  2:45 PM EST by video enabled telemedicine visit and verified that I am speaking with the correct person using two identifiers.   I discussed the limitations, risks, security and privacy concerns of performing an evaluation and management service by telemedicine and the availability of in-person appointments. I also discussed with the patient that there may be a patient responsible charge related to this service. The patient expressed understanding and agreed to proceed.   Other persons participating in the visit and their role in the encounter: Patient, MD.  Patient's location: Home. Provider's location: Clinic.  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  She currently feels well and is asymptomatic.  She is tolerating tamoxifen without significant side effects. She has no neurologic complaints. She denies any recent fevers or illnesses. She has a good appetite and denies weight loss.  She denies any chest pain, shortness of breath, cough, or hemoptysis.  She denies any nausea, vomiting, constipation, or diarrhea. She has no urinary  complaints.  Patient feels at her baseline offers no specific complaints today.  REVIEW OF SYSTEMS:   Review of Systems  Constitutional: Negative.  Negative for fever, malaise/fatigue and weight loss.  Respiratory: Negative.  Negative for cough and shortness of breath.   Cardiovascular: Negative.  Negative for chest pain and leg swelling.  Gastrointestinal: Negative.  Negative for abdominal pain.  Genitourinary: Negative.  Negative for dysuria.  Musculoskeletal: Negative.  Negative for back pain.  Skin: Negative.  Negative for rash.  Neurological: Negative.  Negative for sensory change, focal weakness and weakness.  Endo/Heme/Allergies: Negative for environmental allergies.  Psychiatric/Behavioral: Negative.  The patient is not nervous/anxious.     As per HPI. Otherwise, a complete review of systems is negative.  PAST MEDICAL HISTORY: Past Medical History:  Diagnosis Date  . Anemia   . Arthritis   . Breast cancer ([place] 06/28/2016   right breast DCIS grade 3  . 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 Diabetes mellitus without complication ([place]   . Hyperlipidemia   . Hypertension   . Hyperthyroidism   . Obesity   . Personal history of radiation therapy    Right lumpectomy 2018  . Thyroid disease    hyperthyroidism  . Vitamin D deficiency     PAST SURGICAL HISTORY: Past Surgical History:  Procedure Laterality Date  . BREAST BIOPSY Right 06/28/2016   stereotactic biopsy - DUCTAL CARCINOMA IN SITU (DCIS), HIGH NUCLEAR GRADE  (very narrow margins)  . BREAST LUMPECTOMY Right 07/18/2016  . JOINT REPLACEMENT Left    tkr  . KNEE SURGERY Left    arthroscopy  . MASTECTOMY, PARTIAL Right 07/18/2016   Procedure: MASTECTOMY PARTIAL;  Surgeon: JRobert Bellow MD;  Location: [facility] ORS;  Service: General;  Laterality:  Right;  [name] SKIN CANCER EXCISION     face    FAMILY HISTORY: Family History  Problem Relation Age of Onset  . Hypertension Mother   . Cervical cancer Mother   .  Heart disease Father   . Hypertension Father   . Lung cancer Father   . Lung cancer Sister   . Brain cancer Sister   . Diabetes Brother   . Heart disease Brother   . Hypertension Brother   . Stroke Brother   . Breast cancer Neg Hx     ADVANCED DIRECTIVES (Y/N):  N  HEALTH MAINTENANCE: Social History   Tobacco Use  . Smoking status: Never Smoker  . Smokeless tobacco: Never Used  Vaping Use  . Vaping Use: Never used  Substance Use Topics  . Alcohol use: No  . Drug use: No     Colonoscopy:  PAP:  Bone density:  Lipid panel:  Allergies  Allergen Reactions  . Gabapentin Swelling  . Quinine Nausea Only and Nausea And Vomiting  . Amoxicillin Itching and Rash    Has patient had a PCN reaction causing immediate rash, facial/tongue/throat swelling, SOB or lightheadedness with hypotension: No Has patient had a PCN reaction causing severe rash involving mucus membranes or skin necrosis: No Has patient had a PCN reaction that required hospitalization No Has patient had a PCN reaction occurring within the last 10 years: No If all of the above answers are "NO", then may proceed with Cephalosporin use.   . Dilaudid  [Hydromorphone Hcl] Rash  . Etodolac Rash  . Hydrochlorothiazide Rash  . Hydrocodone Rash  . Hydromorphone Rash  . Sulfa Antibiotics Rash    Current Outpatient Medications  Medication Sig Dispense Refill  . acetaminophen (TYLENOL) 500 MG tablet Take 500 mg by mouth every 6 (six) hours as needed for mild pain or moderate pain.    [name] atorvastatin (LIPITOR) 10 MG tablet Take 10 mg by mouth daily at 6 PM.     . Cholecalciferol (VITAMIN D) 2000 units CAPS Take 2,000 Units by mouth daily.     . furosemide (LASIX) 20 MG tablet TAKE 1 TABLET BY MOUTH EVERY DAY 30 tablet 3  . glipiZIDE (GLUCOTROL XL) 5 MG 24 hr tablet TAKE 1 TABLET BY MOUTH DAILY 90 tablet 3  . glucose blood (ONETOUCH VERIO) test strip Use as instructed; Check blood sugar twice daily 100 each 12  .  lisinopril-hydrochlorothiazide (ZESTORETIC) 20-12.5 MG tablet TAKE 2 TABLETS BY MOUTH EVERY MORNING 180 tablet 1  . metoprolol succinate (TOPROL-XL) 100 MG 24 hr tablet TAKE 1 TABLET BY MOUTH DAILY WITH OR IMMEDIATELY FOLLOWING A MEAL 90 tablet 1  . sitaGLIPtin-metformin ([name]) 50-1000 MG tablet TAKE 1 TABLET BY MOUTH TWICE DAILY WITH A MEAL 180 tablet 3  . tamoxifen (NOLVADEX) 20 MG tablet TAKE 1 TABLET(20 MG) BY MOUTH DAILY 90 tablet 3  . Magnesium 500 MG CAPS Take 1 capsule (500 mg total) by mouth at bedtime. 30 capsule 5  . pioglitazone (ACTOS) 45 MG tablet TAKE 1 TABLET(45 MG) BY MOUTH DAILY (Patient not taking: No sig reported) 30 tablet 11   No current facility-administered medications for this visit.    OBJECTIVE: There were no vitals filed for this visit.   There is no height or weight on file to calculate BMI.    ECOG FS:0 - Asymptomatic  General: Well-developed, well-nourished, no acute distress. HEENT: Normocephalic. Neuro: Alert, answering all questions appropriately. Cranial nerves grossly intact. Psych: Normal affect.  LAB RESULTS:  Lab Results  Component Value Date   NA 135 02/19/2020   K 4.5 02/19/2020   CL 100 02/19/2020   CO2 21 02/19/2020   GLUCOSE 170 (H) 02/19/2020   BUN 19 02/19/2020   CREATININE 1.20 (H) 02/19/2020   CALCIUM 8.9 02/19/2020   PROT 6.6 02/19/2020   ALBUMIN 3.9 02/19/2020   AST 15 02/19/2020   ALT 8 02/19/2020   ALKPHOS 53 02/19/2020   BILITOT 0.5 02/19/2020   GFRNONAA 45 (L) 02/19/2020   GFRAA 52 (L) 02/19/2020    Lab Results  Component Value Date   WBC 9.2 02/19/2020   NEUTROABS 6.1 02/19/2020   HGB 11.7 02/19/2020   HCT 35.5 02/19/2020   MCV 89 02/19/2020   PLT 200 02/19/2020     STUDIES: No results found.  ASSESSMENT: Right breast high grade DCIS.  PLAN:    1. Right breast high grade DCIS: Final pathology was reviewed independently as also discussed at breast case conference. Patient had close margins less than 0.5  mm and was given the option for reexcision to obtain better margins or proceed directly with XRT. She declined reexcision and subsequently underwent adjuvant XRT which she completed in May 2018.  Continue tamoxifen for total of 5 years completing treatment in May 2023.  Her most recent mammogram on June 27, 2019 was reported as BI-RADS 2, repeat in February 2022.  Return to clinic in 6 months for routine evaluation. 2.  Hypertension: Chronic and unchanged.  Continue monitoring and treatment per primary care.  3.  Renal insufficiency: Patient's most recent creatinine on May 30, 2019 was reported at 1.21.  Monitor.   4.  Anemia: Chronic and unchanged.   I provided 20 minutes of face-to-face video visit time during this encounter which included chart review, counseling, and coordination of care as documented above.  Patient expressed understanding and was in agreement with this plan. She also understands that She can call clinic at any time with any questions, concerns, or complaints.   Cancer Staging Ductal carcinoma in situ (DCIS) of right breast Staging form: Breast, AJCC 8th Edition - Pathologic stage from 08/02/2016: Stage 0 (pTis (DCIS), pN0, cM0, ER: Positive, PR: Positive, HER2: Negative) - Signed by Lloyd Huger, MD on 08/02/2016   Lloyd Huger, MD   04/23/2020 9:34 AM

## 2020-04-21 ENCOUNTER — Other Ambulatory Visit: Payer: Self-pay

## 2020-04-21 ENCOUNTER — Encounter: Payer: Self-pay | Admitting: Oncology

## 2020-04-21 ENCOUNTER — Inpatient Hospital Stay: Payer: Medicare Other | Attending: Oncology | Admitting: Oncology

## 2020-04-21 ENCOUNTER — Telehealth: Payer: Self-pay

## 2020-04-21 DIAGNOSIS — D0511 Intraductal carcinoma in situ of right breast: Secondary | ICD-10-CM | POA: Diagnosis not present

## 2020-04-21 NOTE — Progress Notes (Signed)
Patient denies any concerns today.  

## 2020-04-21 NOTE — Progress Notes (Signed)
Chronic Care Management Pharmacy Assistant   Name: Carol Ramsey  MRN: [id number] DOB: 07-09-1945    PCP : Jerrol Banana., MD  Allergies:   Allergies  Allergen Reactions  . Gabapentin Swelling  . Quinine Nausea Only and Nausea And Vomiting  . Amoxicillin Itching and Rash    Has patient had a PCN reaction causing immediate rash, facial/tongue/throat swelling, SOB or lightheadedness with hypotension: No Has patient had a PCN reaction causing severe rash involving mucus membranes or skin necrosis: No Has patient had a PCN reaction that required hospitalization No Has patient had a PCN reaction occurring within the last 10 years: No If all of the above answers are "NO", then may proceed with Cephalosporin use.   . Dilaudid  [Hydromorphone Hcl] Rash  . Etodolac Rash  . Hydrochlorothiazide Rash  . Hydrocodone Rash  . Hydromorphone Rash  . Sulfa Antibiotics Rash    Medications: Outpatient Encounter Medications as of 04/21/2020  Medication Sig Note  . acetaminophen (TYLENOL) 500 MG tablet Take 500 mg by mouth every 6 (six) hours as needed for mild pain or moderate pain.   [name] atorvastatin (LIPITOR) 10 MG tablet Take 10 mg by mouth daily at 6 PM.  09/17/2014: Received from: [facility] [facility]  . Cholecalciferol (VITAMIN D) 2000 units CAPS Take 2,000 Units by mouth daily.  04/02/2020: Reports taking 5000 IU  . furosemide (LASIX) 20 MG tablet TAKE 1 TABLET BY MOUTH EVERY DAY 04/02/2020: Reports taking as needed for edema  . glipiZIDE (GLUCOTROL XL) 5 MG 24 hr tablet TAKE 1 TABLET BY MOUTH DAILY   . glucose blood (ONETOUCH VERIO) test strip Use as instructed; Check blood sugar twice daily   . lisinopril-hydrochlorothiazide (ZESTORETIC) 20-12.5 MG tablet TAKE 2 TABLETS BY MOUTH EVERY MORNING   . Magnesium 500 MG CAPS Take 1 capsule (500 mg total) by mouth at bedtime.   . metoprolol succinate (TOPROL-XL) 100 MG 24 hr tablet TAKE 1 TABLET BY MOUTH DAILY WITH OR IMMEDIATELY  FOLLOWING A MEAL   . pioglitazone (ACTOS) 45 MG tablet TAKE 1 TABLET(45 MG) BY MOUTH DAILY (Patient not taking: No sig reported) 04/02/2020: Reports not taking for several months.  . sitaGLIPtin-metformin ([name]) 50-1000 MG tablet TAKE 1 TABLET BY MOUTH TWICE DAILY WITH A MEAL   . tamoxifen (NOLVADEX) 20 MG tablet TAKE 1 TABLET(20 MG) BY MOUTH DAILY    No facility-administered encounter medications on file as of 04/21/2020.    Current Diagnosis: Patient Active Problem List   Diagnosis Date Noted  . OA (osteoarthritis) of knee 10/30/2018  . Lymphedema 10/30/2018  . DDD (degenerative disc disease), lumbar 02/28/2018  . Osteoarthritis of sacroiliac joint (Left) 02/28/2018  . Somatic dysfunction of sacroiliac joint (Left) 02/28/2018  . Sacroiliac joint dysfunction (Left) 02/28/2018  . Abnormal MRI, lumbar spine (07/22/2013) 02/28/2018  . Lumbar facet syndrome (Bilateral) 02/28/2018  . Lumbar spondylosis 02/28/2018  . 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 Lumbar facet arthropathy (Bilateral) 02/28/2018  . Lumbar intervertebral disc protrusion 02/28/2018  . Lumbar nerve root compression 02/28/2018  . Morbid obesity with BMI of 40.0-44.9, adult ([place]) 02/27/2018  . Hypoalbuminemia 02/27/2018  . Hypomagnesemia 02/27/2018  . Vitamin B 12 deficiency 02/27/2018  . Low serum vitamin B12 02/06/2018  . Morbid obesity ([place]) 02/01/2018  . Chronic low back pain (Primary Area of Pain) (Bilateral) w/ sciatica (Left) 02/01/2018  . Chronic lower extremity pain (Secondary Area of Pain) (Left) 02/01/2018  . Chronic pain syndrome 02/01/2018  .  Pharmacologic therapy 02/01/2018  . Disorder of skeletal system 02/01/2018  . Problems influencing health status 02/01/2018  . Chronic sacroiliac joint pain (Left) 02/01/2018  . Bilateral leg edema 02/21/2017  . CKD (chronic kidney disease) stage 3, GFR 30-59 ml/min ([name]) 07/21/2016  . 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  LVH (left ventricular hypertrophy) due to hypertensive disease, without heart failure 01/19/2016  . Pulmonary hypertension ([place]) 02/10/2015  . Acquired hypothyroidism 09/17/2014  . Adaptation reaction 09/17/2014  . Absolute anemia 09/17/2014  . Cardiac dysrhythmia 09/17/2014  . Lumbar facet hypertrophy 09/17/2014  . 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 Diabetes mellitus type 2, uncomplicated ([place]) 41/28/7867  . Essential (primary) hypertension 09/17/2014  . HLD (hyperlipidemia) 09/17/2014  . Extreme obesity 09/17/2014  . Vitamin D insufficiency 09/17/2014  . Beat, premature ventricular 12/11/2013  . OBESITY 11/14/2007  . Achilles bursitis or tendinitis 11/14/2007  . Calcaneal bursitis (heel), unspecified laterality 06/26/2007    Goals Addressed   None     Follow-Up:  [facility]  .Have you seen any other providers since your last visit? **no Any changes in your medications or health? no Any side effects from any medications? no Do you have an symptoms or problems not managed by your medications? no Any concerns about your health right now? no Has your provider asked that you check blood pressure, blood sugar, or follow special diet at home? Yes, not much of appetitite checks bp daily bg several times weeks  fasting Do you get any type of exercise on a regular basis? Unable to stand or walk more than a few feet. Does do physical therapy stretches/excercises Can you think of a goal you would like to reach for your health?decrease amount of back pain so that she is able to walk more Do you have any problems getting your medications? No. Pt's husband does make multiple trips to pharmacy a month. [name] is very expensive Is there anything that you would like to discuss during the appointment? Not that she is aware of  Please bring medications and supplements to appointment

## 2020-04-23 ENCOUNTER — Telehealth: Payer: Self-pay | Admitting: Family Medicine

## 2020-04-23 NOTE — Telephone Encounter (Signed)
Called to check the status of a requisition that was faxed on 04/20/20 to the office for patient.  Please call to confirm receipt at [phone]

## 2020-04-27 NOTE — Chronic Care Management (AMB) (Signed)
Chronic Care Management Pharmacy  Name: Carol Ramsey  MRN: [id number] DOB: 1945/08/23   Chief Complaint/ HPI  Carol Ramsey,  74 y.o. , female presents for her Initial CCM visit with the clinical pharmacist via telephone.  PCP : Jerrol Banana., MD Patient Care Team: Jerrol Banana., MD as PCP - General (Family Medicine) Marry Guan, Laurice Record, MD as Consulting Physician (Orthopedic Surgery) Dasher, Rayvon Char, MD as Consulting Physician (Dermatology) Corey Skains, MD as Consulting Physician (Cardiology) Thelma Comp, Pratt as Consulting Physician (Optometry) Bary Castilla, Forest Gleason, MD (General Surgery) Noreene Filbert, MD as Referring Physician (Radiation Oncology) Lloyd Huger, MD as Consulting Physician (Oncology) Neldon Labella, RN as Registered Nurse Germaine Pomfret, [facility] (Pharmacist)  Patient's chronic conditions include: Hypertension, Hyperlipidemia, Diabetes, Chronic Kidney Disease, Hypothyroidism and Chronic Pain and History of Breast Cancer   Office Visits: 04/02/20: Patient presented to [facility] for CCM.  02/19/20: Patient presented to Dr. Rosanna Randy for follow-up. A1c improved to 7.6%. TSH slightly elevated. No medication changes made.   Consult Visit: 04/21/20: Video visit with Dr. Grayland Ormond (Oncology) for breast cancer follow-up. Patient stable, asymptomatic. No medication changes made.  12/10/19: Patient presented to Dr. Nehemiah Massed (Cardiology) for follow-up.    Subjective: Patient is in good spirits today. She states she is satisfied with her health, although she is concerned about her blood sugars which have been elevated recently. She also notes. She has significant back pain which limits her mobility.   Objective: Allergies  Allergen Reactions  . Gabapentin Swelling  . Quinine Nausea Only and Nausea And Vomiting  . Amoxicillin Itching and Rash    Has patient had a PCN reaction causing immediate rash, facial/tongue/throat swelling,  SOB or lightheadedness with hypotension: No Has patient had a PCN reaction causing severe rash involving mucus membranes or skin necrosis: No Has patient had a PCN reaction that required hospitalization No Has patient had a PCN reaction occurring within the last 10 years: No If all of the above answers are "NO", then may proceed with Cephalosporin use.   . Dilaudid  [Hydromorphone Hcl] Rash  . Etodolac Rash  . Hydrochlorothiazide Rash  . Hydrocodone Rash  . Hydromorphone Rash  . Sulfa Antibiotics Rash    Medications: Outpatient Encounter Medications as of 04/28/2020  Medication Sig Note  . acetaminophen (TYLENOL) 500 MG tablet Take 500 mg by mouth every 6 (six) hours as needed for mild pain or moderate pain.   [name] atorvastatin (LIPITOR) 10 MG tablet Take 10 mg by mouth daily at 6 PM.    . Cholecalciferol (VITAMIN D) 125 MCG (5000 UT) CAPS Take 5,000 Units by mouth daily.   . furosemide (LASIX) 20 MG tablet TAKE 1 TABLET BY MOUTH EVERY DAY 04/02/2020: Reports taking as needed for edema  . glipiZIDE (GLUCOTROL XL) 5 MG 24 hr tablet TAKE 1 TABLET BY MOUTH DAILY   . lisinopril-hydrochlorothiazide (ZESTORETIC) 20-12.5 MG tablet TAKE 2 TABLETS BY MOUTH EVERY MORNING   . metoprolol succinate (TOPROL-XL) 100 MG 24 hr tablet TAKE 1 TABLET BY MOUTH DAILY WITH OR IMMEDIATELY FOLLOWING A MEAL   . sitaGLIPtin-metformin ([name]) 50-1000 MG tablet TAKE 1 TABLET BY MOUTH TWICE DAILY WITH A MEAL   . tamoxifen (NOLVADEX) 20 MG tablet TAKE 1 TABLET(20 MG) BY MOUTH DAILY   . glucose blood (ONETOUCH VERIO) test strip Use as instructed; Check blood sugar twice daily   . Magnesium 500 MG CAPS Take 1 capsule (500 mg total) by mouth at bedtime.   . [  DISCONTINUED] pioglitazone (ACTOS) 45 MG tablet TAKE 1 TABLET(45 MG) BY MOUTH DAILY (Patient not taking: No sig reported) 04/02/2020: Reports not taking for several months.   No facility-administered encounter medications on file as of 04/28/2020.    Wt Readings  from Last 3 Encounters:  02/19/20 239 lb (108.4 kg)  10/17/19 247 lb 8 oz (112.3 kg)  09/26/19 244 lb (110.7 kg)    Lab Results  Component Value Date   CREATININE 1.20 (H) 02/19/2020   BUN 19 02/19/2020   GFRNONAA 45 (L) 02/19/2020   GFRAA 52 (L) 02/19/2020   NA 135 02/19/2020   K 4.5 02/19/2020   CALCIUM 8.9 02/19/2020   CO2 21 02/19/2020     Current Diagnosis/Assessment:  SDOH Interventions   Flowsheet Row Most Recent Value  SDOH Interventions   Financial Strain Interventions Other (Comment)  [Will start PAP for [name]]  Transportation Interventions Intervention Not Indicated      Goals Addressed            This Visit's Progress   . Chronic Disease Management   On track    CARE PLAN ENTRY (see longitudinal plan of care for additional care plan information)  Current Barriers:  . Chronic Disease Management support and education needs related to Hypertension, Diabetes and Chronic back pain.  Nurse Case Manager Clinical Goal(s):  Over the next 120 days, patient: . Will monitor fasting blood glucose levels daily and maintain a log.  . Will adhere to diabetes self-management recommendations and decrease A1C by 1 point. . Will monitor BP routinely and record readings. . Will take all medications as prescribed. . Will attend medical appointments as scheduled. . Will follow safety recommendations to prevent falls and injuries. Over the next 30 days, patient: . Will a follow-up with Pain Management provider if knee pain worsens.  Interventions:  . Inter-disciplinary care team collaboration (see longitudinal plan of care) . Reviewed medications. Encouraged to take all medications as prescribed and notify provider if unable to tolerate. Encouraged to keep care management team updated of concerns regarding medication management or prescription costs. Expressed concerns regarding the cost of [name]. Agreeable to outreach with the CCM Pharmacist regarding available  medication assistance.  . Discussed s/sx of hyperglycemia and hypoglycemia along with recommended interventions. Discussed fasting blood glucose levels. Encouraged to monitor daily and maintain a log. Reports not monitoring fasting levels daily.  Reports most fasting readings have ranged from 110 to the 120's. She recalls an elevated reading of 263 mg/dl on yesterday. Thinks it was likely d/t eating candy. Feels that she is doing fairly well with maintaining a heart healthy/diabetic diet. Most recent A1C was 7/6%.  . Provided information regarding established BP ranges. Encouraged to monitor routinely if unable to monitor daily and record readings.   . Provided information regarding safety and fall prevention measures. Discussed fall risk and limitations d/t chronic back pain and joint stiffness. Encouraged to use an assistive device when needed to prevent accidental falls and injuries. Advised to avoid prolonged and over strenuous activities. Currently working with the Pain Management Clinic in [facility] and receiving injections to her knee and lower back. Reports experiencing moderate relief following the last injections. She plans to follow-up if her knee pain worsens. Denies recent falls   . Reviewed pending/scheduled appointments. Encouraged to attend medical appointments as scheduled to prevent delays in care. Encouraged to notify the care management team with concerns regarding transportation.  . Discussed plans for ongoing care management follow up. Provided direct  contact information for the CCM Nurse Case Manager. Overall, feels that she is doing well. Remains independent in the home. Her spouse and son are available to assist if needed. Denies urgent concerns. Will submit request for outreach with the CCM Pharmacist and follow-up next month.    Patient Self Care Activities:  . Self administers medications as prescribed . Attend all scheduled provider appointments . Call pharmacy for  medication refills . Perform ADL's independently . Call provider office for new concerns or questions   Initial goal documentation       Hypertension   BP goal is:  <130/80  Office blood pressures are  BP Readings from Last 3 Encounters:  02/19/20 (!) 150/68  11/27/19 (!) 137/59  10/17/19 (!) 155/46   Patient checks BP at home Never. She does not own a blood pressure monitor Patient home BP readings are ranging: NA  Patient has failed these meds in the past: NA Patient is currently uncontrolled on the following medications:  . Furosemide 20 mg daily PRN   . Lisinopril-HCTZ 20-12.5 mg 2 tablets daily  . Metoprolol XL 100 mg daily   We discussed diet and exercise extensively. She tries to limit salt content. Denies dizziness, headaches, or chest pain.   Could consider consolidating lisinopril-HCTZ into one pill daily.   Plan  Continue current medications  Increase monitoring to once weekly.   Hyperlipidemia   LDL goal < 70  Last lipids Lab Results  Component Value Date   CHOL 121 02/19/2020   HDL 61 02/19/2020   LDLCALC 42 02/19/2020   TRIG 97 02/19/2020   CHOLHDL 2.0 02/19/2020   Hepatic Function Latest Ref Rng & Units 02/19/2020 05/30/2019 01/29/2019  Total Protein 6.0 - 8.5 g/dL 6.6 - 6.2  Albumin 3.7 - 4.7 g/dL 3.9 3.7 3.6(L)  AST 0 - 40 IU/L 15 - 15  ALT 0 - 32 IU/L 8 - -  Alk Phosphatase 44 - 121 IU/L 53 - 30(L)  Total Bilirubin 0.0 - 1.2 mg/dL 0.5 - 0.5     The ASCVD Risk score (Garden., et al., 2013) failed to calculate for the following reasons:   The valid total cholesterol range is 130 to 320 mg/dL   Patient has failed these meds in past: NA Patient is currently controlled on the following medications:  . Atorvastatin 10 mg daily   We discussed:  diet and exercise extensively. Denies myalgias   Plan  Continue current medications  Diabetes   Diagnosed 36  A1c goal <8%  Recent Relevant Labs: Lab Results  Component Value Date/Time    HGBA1C 7.6 (H) 02/19/2020 10:56 AM   HGBA1C 8.3 (A) 09/26/2019 10:16 AM   HGBA1C 9.5 (H) 05/30/2019 10:13 AM    Last diabetic Eye exam:  Lab Results  Component Value Date/Time   HMDIABEYEEXA Retinopathy (A) 12/30/2019 12:00 AM    Last diabetic Foot exam: No results found for: HMDIABFOOTEX   Checking BG: Every 2-3 days   Recent FBG Readings: 200+ (previously was 120-130s)  Recent pre-meal BG readings: NA Recent 2hr PP BG readings:  NA Recent HS BG readings: NA  Patient has failed these meds in past: NA Patient is currently uncontrolled (based on current readings) on the following medications: [name] Glipizide XL 5 mg daily  . [name] 50-1000 mg twice daily   We discussed: diet and exercise extensively. Overall eats very little food. Dinner last night was part of a burrito and a few grapes.   Back pain is  very limiting and prevents her from walking.   Patient is worried her glucometer is not accurate. States she received her glucometer in 2017. Her test strips are expired.   Plan  Continue current medications  Patient to get new test strips to check her blood sugars CPA assessment in one month to re-assess readings.   History of Breast Cancer    Patient has failed these meds in past: NA Patient is currently controlled on the following medications:  . Tamoxifen 20 mg daily  We discussed:  Stable without side effects  Plan  Continue current medications  Misc / OTC   . APAP 500 mg q6hr PRN -Once every 2-3 weeks.  . Vitamin D 5000 units daily  . Magnesium 500 mg QHS  . Vitamin C 500 mg daily  . Vitamin B12 5000 mcg SL daily   Plan  Continue current medications  Vaccines   Reviewed and discussed patient's vaccination history.    Immunization History  Administered Date(s) Administered  . Fluad Quad(high Dose 65+) 01/29/2019, 01/30/2020  . Influenza Split 02/25/2006, 03/07/2011, 02/27/2012  . Influenza, High Dose Seasonal PF 03/03/2016, 01/26/2017, 01/08/2018   . Influenza,inj,Quad PF,6+ Mos 03/04/2013  . PFIZER SARS-COV-2 Vaccination 06/11/2019, 07/02/2019, 01/31/2020  . Pneumococcal Conjugate-13 04/13/2015  . Pneumococcal Polysaccharide-23 02/17/2007, 02/27/2012  . Td 10/21/2003  . Tdap 05/11/2016  . Zoster 11/08/2010   Medication Management   Patient's preferred pharmacy is:  [id number] - [address], [address] [facility] [address] [place] [address] Phone: [phone] Fax: [phone]  Uses pill box? Yes Pt endorses 95% compliance  We discussed: Current pharmacy is preferred with insurance plan and patient is satisfied with pharmacy services  Plan  Continue current medication management strategy  Follow up: 3 month phone visit  [place] [phone]

## 2020-04-28 ENCOUNTER — Ambulatory Visit: Payer: Medicare Other

## 2020-04-28 DIAGNOSIS — E1169 Type 2 diabetes mellitus with other specified complication: Secondary | ICD-10-CM

## 2020-04-28 DIAGNOSIS — I10 Essential (primary) hypertension: Secondary | ICD-10-CM

## 2020-04-28 DIAGNOSIS — E118 Type 2 diabetes mellitus with unspecified complications: Secondary | ICD-10-CM

## 2020-04-28 DIAGNOSIS — E785 Hyperlipidemia, unspecified: Secondary | ICD-10-CM

## 2020-04-28 NOTE — Telephone Encounter (Signed)
Have you seen a fax for this patient?

## 2020-04-28 NOTE — Patient Instructions (Signed)
Visit Information It was great speaking with you today!  Please let me know if you have any questions about our visit.  Goals Addressed            This Visit's Progress   . Chronic Disease Management   On track    CARE PLAN ENTRY (see longitudinal plan of care for additional care plan information)  Current Barriers:  . Chronic Disease Management support and education needs related to Hypertension, Diabetes and Chronic back pain.  Nurse Case Manager Clinical Goal(s):  Over the next 120 days, patient: . Will monitor fasting blood glucose levels daily and maintain a log.  . Will adhere to diabetes self-management recommendations and decrease A1C by 1 point. . Will monitor BP routinely and record readings. . Will take all medications as prescribed. . Will attend medical appointments as scheduled. . Will follow safety recommendations to prevent falls and injuries. Over the next 30 days, patient: . Will a follow-up with Pain Management provider if knee pain worsens.  Interventions:  . Inter-disciplinary care team collaboration (see longitudinal plan of care) . Reviewed medications. Encouraged to take all medications as prescribed and notify provider if unable to tolerate. Encouraged to keep care management team updated of concerns regarding medication management or prescription costs. Expressed concerns regarding the cost of [name]. Agreeable to outreach with the CCM Pharmacist regarding available medication assistance.  . Discussed s/sx of hyperglycemia and hypoglycemia along with recommended interventions. Discussed fasting blood glucose levels. Encouraged to monitor daily and maintain a log. Reports not monitoring fasting levels daily.  Reports most fasting readings have ranged from 110 to the 120's. She recalls an elevated reading of 263 mg/dl on yesterday. Thinks it was likely d/t eating candy. Feels that she is doing fairly well with maintaining a heart healthy/diabetic diet. Most  recent A1C was 7/6%.  . Provided information regarding established BP ranges. Encouraged to monitor routinely if unable to monitor daily and record readings.   . Provided information regarding safety and fall prevention measures. Discussed fall risk and limitations d/t chronic back pain and joint stiffness. Encouraged to use an assistive device when needed to prevent accidental falls and injuries. Advised to avoid prolonged and over strenuous activities. Currently working with the [facility] Management Clinic in [facility] and receiving injections to her knee and lower back. Reports experiencing moderate relief following the last injections. She plans to follow-up if her knee pain worsens. Denies recent falls   . Reviewed pending/scheduled appointments. Encouraged to attend medical appointments as scheduled to prevent delays in care. Encouraged to notify the care management team with concerns regarding transportation.  . Discussed plans for ongoing care management follow up. Provided direct contact information for the CCM Nurse Case Manager. Overall, feels that she is doing well. Remains independent in the home. Her spouse and son are available to assist if needed. Denies urgent concerns. Will submit request for outreach with the CCM Pharmacist and follow-up next month.    Patient Self Care Activities:  . Self administers medications as prescribed . Attend all scheduled provider appointments . Call pharmacy for medication refills . Perform ADL's independently . Call provider office for new concerns or questions   Initial goal documentation        Ms. Hipwell was given information about Chronic Care Management services today including:  1. CCM service includes personalized support from designated clinical staff supervised by her physician, including individualized plan of care and coordination with other care providers 2. 24/7 contact phone  numbers for assistance for urgent and routine care  needs. 3. Standard insurance, coinsurance, copays and deductibles apply for chronic care management only during months in which we provide at least 20 minutes of these services. Most insurances cover these services at 100%, however patients may be responsible for any copay, coinsurance and/or deductible if applicable. This service may help you avoid the need for more expensive face-to-face services. 4. Only one practitioner may furnish and bill the service in a calendar month. 5. The patient may stop CCM services at any time (effective at the end of the month) by phone call to the office staff.  Patient agreed to services and verbal consent obtained.   The patient verbalized understanding of instructions, educational materials, and care plan provided today and agreed to receive a mailed copy of patient instructions, educational materials, and care plan.  Telephone follow up appointment with pharmacy team member scheduled for: 07/31/20 at 1:00 PM  [place] [phone]

## 2020-04-29 ENCOUNTER — Telehealth: Payer: Self-pay

## 2020-04-29 DIAGNOSIS — E119 Type 2 diabetes mellitus without complications: Secondary | ICD-10-CM

## 2020-04-29 NOTE — Telephone Encounter (Signed)
She will need appt to discuss special labs she might want ordered.

## 2020-04-29 NOTE — Telephone Encounter (Signed)
LMOVM for Amy to return call.

## 2020-04-29 NOTE — Progress Notes (Signed)
Patient requesting a rx for her test strips to be sent to her [facility].  Notified Clinical Pharmacist of above.  [place] Pharmacist Assistant [phone]

## 2020-04-30 MED ORDER — ONETOUCH VERIO VI STRP: ORAL_STRIP | 12 refills | Status: DC

## 2020-04-30 NOTE — Addendum Note (Signed)
Addended by: Julieta Bellini on: 04/30/2020 03:04 PM   Modules accepted: Orders

## 2020-04-30 NOTE — Telephone Encounter (Signed)
Ok to rf for 1 year. 

## 2020-04-30 NOTE — Telephone Encounter (Signed)
Rx sent 

## 2020-05-06 ENCOUNTER — Other Ambulatory Visit: Payer: Self-pay | Admitting: Oncology

## 2020-05-06 DIAGNOSIS — Z1231 Encounter for screening mammogram for malignant neoplasm of breast: Secondary | ICD-10-CM

## 2020-05-12 ENCOUNTER — Encounter: Payer: Self-pay | Admitting: *Deleted

## 2020-05-13 DIAGNOSIS — C44329 Squamous cell carcinoma of skin of other parts of face: Secondary | ICD-10-CM | POA: Diagnosis not present

## 2020-05-20 ENCOUNTER — Telehealth: Payer: Self-pay

## 2020-05-20 NOTE — Progress Notes (Signed)
Chronic Care Management Pharmacy Assistant   Name: Carol Ramsey  MRN: [id number] DOB: 1945/05/17  Reason for Encounter:Diabetes  Disease State Call.  Patient Questions:  1.  Have you seen any other providers since your last visit? No  2.  Any changes in your medicines or health? No     PCP : Jerrol Banana., MD  Allergies:   Allergies  Allergen Reactions  . Gabapentin Swelling  . Quinine Nausea Only and Nausea And Vomiting  . Amoxicillin Itching and Rash    Has patient had a PCN reaction causing immediate rash, facial/tongue/throat swelling, SOB or lightheadedness with hypotension: No Has patient had a PCN reaction causing severe rash involving mucus membranes or skin necrosis: No Has patient had a PCN reaction that required hospitalization No Has patient had a PCN reaction occurring within the last 10 years: No If all of the above answers are "NO", then may proceed with Cephalosporin use.   . Dilaudid  [Hydromorphone Hcl] Rash  . Etodolac Rash  . Hydrochlorothiazide Rash  . Hydrocodone Rash  . Hydromorphone Rash  . Sulfa Antibiotics Rash    Medications: Outpatient Encounter Medications as of 05/20/2020  Medication Sig Note  . acetaminophen (TYLENOL) 500 MG tablet Take 500 mg by mouth every 6 (six) hours as needed for mild pain or moderate pain.   [name] ascorbic acid (VITAMIN C) 500 MG tablet Take 500 mg by mouth daily.   [name] atorvastatin (LIPITOR) 10 MG tablet Take 10 mg by mouth daily at 6 PM.    . Cholecalciferol (VITAMIN D) 125 MCG (5000 UT) CAPS Take 5,000 Units by mouth daily.   . Cyanocobalamin (VITAMIN B-12) 5000 MCG SUBL Place 5,000 mcg under the tongue daily.   . furosemide (LASIX) 20 MG tablet TAKE 1 TABLET BY MOUTH EVERY DAY 04/02/2020: Reports taking as needed for edema  . glipiZIDE (GLUCOTROL XL) 5 MG 24 hr tablet TAKE 1 TABLET BY MOUTH DAILY   . glucose blood (ONETOUCH VERIO) test strip Use as instructed; Check blood sugar twice daily   .  lisinopril-hydrochlorothiazide (ZESTORETIC) 20-12.5 MG tablet TAKE 2 TABLETS BY MOUTH EVERY MORNING   . Magnesium 500 MG CAPS Take 1 capsule (500 mg total) by mouth at bedtime.   . metoprolol succinate (TOPROL-XL) 100 MG 24 hr tablet TAKE 1 TABLET BY MOUTH DAILY WITH OR IMMEDIATELY FOLLOWING A MEAL   . sitaGLIPtin-metformin ([name]) 50-1000 MG tablet TAKE 1 TABLET BY MOUTH TWICE DAILY WITH A MEAL   . tamoxifen (NOLVADEX) 20 MG tablet TAKE 1 TABLET(20 MG) BY MOUTH DAILY    No facility-administered encounter medications on file as of 05/20/2020.    Current Diagnosis: Patient Active Problem List   Diagnosis Date Noted  . OA (osteoarthritis) of knee 10/30/2018  . Lymphedema 10/30/2018  . DDD (degenerative disc disease), lumbar 02/28/2018  . Osteoarthritis of sacroiliac joint (Left) 02/28/2018  . Somatic dysfunction of sacroiliac joint (Left) 02/28/2018  . Sacroiliac joint dysfunction (Left) 02/28/2018  . Abnormal MRI, lumbar spine (07/22/2013) 02/28/2018  . Lumbar facet syndrome (Bilateral) 02/28/2018  . Lumbar spondylosis 02/28/2018  . 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 Lumbar facet arthropathy (Bilateral) 02/28/2018  . Lumbar intervertebral disc protrusion 02/28/2018  . Lumbar nerve root compression 02/28/2018  . Morbid obesity with BMI of 40.0-44.9, adult ([place]) 02/27/2018  . Hypoalbuminemia 02/27/2018  . Hypomagnesemia 02/27/2018  . Vitamin B 12 deficiency 02/27/2018  . Low serum vitamin B12 02/06/2018  . Morbid obesity ([place]) 02/01/2018  .  Chronic low back pain (Primary Area of Pain) (Bilateral) w/ sciatica (Left) 02/01/2018  . Chronic lower extremity pain (Secondary Area of Pain) (Left) 02/01/2018  . Chronic pain syndrome 02/01/2018  . Pharmacologic therapy 02/01/2018  . Disorder of skeletal system 02/01/2018  . Problems influencing health status 02/01/2018  . Chronic sacroiliac joint pain (Left) 02/01/2018  . Bilateral leg edema 02/21/2017  . CKD (chronic  kidney disease) stage 3, GFR 30-59 ml/min ([name]) 07/21/2016  . 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 LVH (left ventricular hypertrophy) due to hypertensive disease, without heart failure 01/19/2016  . Pulmonary hypertension ([place]) 02/10/2015  . Acquired hypothyroidism 09/17/2014  . Adaptation reaction 09/17/2014  . Absolute anemia 09/17/2014  . Cardiac dysrhythmia 09/17/2014  . Lumbar facet hypertrophy 09/17/2014  . 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 Diabetes mellitus type 2, uncomplicated ([place]) 09/38/1829  . Essential (primary) hypertension 09/17/2014  . HLD (hyperlipidemia) 09/17/2014  . Extreme obesity 09/17/2014  . Vitamin D insufficiency 09/17/2014  . Beat, premature ventricular 12/11/2013  . OBESITY 11/14/2007  . Achilles bursitis or tendinitis 11/14/2007  . Calcaneal bursitis (heel), unspecified laterality 06/26/2007    Goals Addressed   None    Recent Relevant Labs: Lab Results  Component Value Date/Time   HGBA1C 7.6 (H) 02/19/2020 10:56 AM   HGBA1C 8.3 (A) 09/26/2019 10:16 AM   HGBA1C 9.5 (H) 05/30/2019 10:13 AM    Kidney Function Lab Results  Component Value Date/Time   CREATININE 1.20 (H) 02/19/2020 10:56 AM   CREATININE 1.21 (H) 05/30/2019 10:13 AM   CREATININE 1.16 (H) 01/26/2017 12:22 PM   CREATININE 1.17 08/31/2011 10:39 AM   CREATININE 1.28 07/28/2011 01:44 AM   GFRNONAA 45 (L) 02/19/2020 10:56 AM   GFRNONAA 49 (L) 08/31/2011 10:39 AM   GFRAA 52 (L) 02/19/2020 10:56 AM   GFRAA 57 (L) 08/31/2011 10:39 AM    . Current antihyperglycemic regimen:   Glipizide XL 5 mg daily   [name] 50-1000 mg twice daily  o  . What recent interventions/DTPs have been made to improve glycemic control:  o None ID  . Have there been any recent hospitalizations or ED visits since last visit with CPP? No  I have attempted without success to contact this patient by phone three times to do her Diabetes Disease State call. I left  a Voice message for patient to return my call.  LVM 01/20 ,01/25,01/27   Adherence Review: Is the patient currently on a STATIN medication? Yes Is the patient currently on ACE/ARB medication? Yes Does the patient have >5 day gap between last estimated fill dates? Yes   Maryjean Ka  Follow-Up:  Pharmacist Review   Anderson [place] Clinical Pharmacist Assistant [phone]

## 2020-05-25 DIAGNOSIS — I071 Rheumatic tricuspid insufficiency: Secondary | ICD-10-CM | POA: Insufficient documentation

## 2020-05-25 DIAGNOSIS — I119 Hypertensive heart disease without heart failure: Secondary | ICD-10-CM | POA: Diagnosis not present

## 2020-05-25 DIAGNOSIS — I10 Essential (primary) hypertension: Secondary | ICD-10-CM | POA: Diagnosis not present

## 2020-05-25 DIAGNOSIS — E782 Mixed hyperlipidemia: Secondary | ICD-10-CM | POA: Diagnosis not present

## 2020-05-25 DIAGNOSIS — R600 Localized edema: Secondary | ICD-10-CM | POA: Diagnosis not present

## 2020-05-25 DIAGNOSIS — I493 Ventricular premature depolarization: Secondary | ICD-10-CM | POA: Diagnosis not present

## 2020-05-26 ENCOUNTER — Telehealth: Payer: Self-pay

## 2020-05-26 DIAGNOSIS — M9905 Segmental and somatic dysfunction of pelvic region: Secondary | ICD-10-CM | POA: Diagnosis not present

## 2020-05-26 DIAGNOSIS — M5136 Other intervertebral disc degeneration, lumbar region: Secondary | ICD-10-CM | POA: Diagnosis not present

## 2020-05-26 DIAGNOSIS — M5451 Vertebrogenic low back pain: Secondary | ICD-10-CM | POA: Diagnosis not present

## 2020-05-26 DIAGNOSIS — M5416 Radiculopathy, lumbar region: Secondary | ICD-10-CM | POA: Diagnosis not present

## 2020-05-26 DIAGNOSIS — M7918 Myalgia, other site: Secondary | ICD-10-CM | POA: Diagnosis not present

## 2020-05-26 DIAGNOSIS — M9903 Segmental and somatic dysfunction of lumbar region: Secondary | ICD-10-CM | POA: Diagnosis not present

## 2020-05-26 NOTE — Telephone Encounter (Signed)
Copied from [place] [id number]. Topic: General - Other >> May 26, 2020 12:29 PM Antonieta Iba C wrote: Reason for CRM: Diane, lab rep with [facility] is calling in to follow up on a lab requisition that was faxed over to the office on 05/07/20. She would like to have a status update.    Phone: [phone] Fax: [phone]

## 2020-05-27 NOTE — Telephone Encounter (Signed)
Left message advising [name] that Dr. Rosanna Randy will not authorize the lab without speaking with [name].  Pt has an appointment coming soon.  Thanks,   -Mickel Baas

## 2020-05-28 NOTE — Progress Notes (Signed)
Subjective:   Carol Ramsey is a 75 y.o. female who presents for Medicare Annual (Subsequent) preventive examination.  Review of Systems    N/A  Cardiac Risk Factors include: advanced age (>79men, >33 women);diabetes mellitus;dyslipidemia;obesity (BMI >30kg/m2);hypertension     Objective:    Today's Vitals   06/01/20 1104  BP: 132/60  Pulse: 78  Temp: 98.5 F (36.9 C)  TempSrc: Oral  Weight: 229 lb 3.2 oz (104 kg)  Height: 5\' 3"  (1.6 m)  PainSc: 0-No pain   Body mass index is 40.6 kg/m.  Advanced Directives 06/01/2020 04/21/2020 11/26/2019 10/16/2019 04/16/2019 09/20/2018 04/05/2018  Does Patient Have a Medical Advance Directive? No No No No No No No  Would patient like information on creating a medical advance directive? No - Patient declined No - Patient declined No - Patient declined No - Patient declined No - Patient declined No - Patient declined No - Patient declined    Current Medications (verified) Outpatient Encounter Medications as of 06/01/2020  Medication Sig  . acetaminophen (TYLENOL) 500 MG tablet Take 500 mg by mouth every 6 (six) hours as needed for mild pain or moderate pain.  [name] ascorbic acid (VITAMIN C) 500 MG tablet Take 500 mg by mouth daily.  [name] atorvastatin (LIPITOR) 10 MG tablet Take 10 mg by mouth daily at 6 PM.   . Cholecalciferol (VITAMIN D) 125 MCG (5000 UT) CAPS Take 5,000 Units by mouth daily.  . Cyanocobalamin (VITAMIN B-12) 5000 MCG SUBL Place 5,000 mcg under the tongue daily.  . furosemide (LASIX) 20 MG tablet TAKE 1 TABLET BY MOUTH EVERY DAY  . glipiZIDE (GLUCOTROL XL) 5 MG 24 hr tablet TAKE 1 TABLET BY MOUTH DAILY  . glucose blood (ONETOUCH VERIO) test strip Use as instructed; Check blood sugar twice daily  . lisinopril-hydrochlorothiazide (ZESTORETIC) 20-12.5 MG tablet TAKE 2 TABLETS BY MOUTH EVERY MORNING  . Magnesium 500 MG CAPS Take 1 capsule (500 mg total) by mouth at bedtime.  . metoprolol succinate (TOPROL-XL) 100 MG 24 hr tablet TAKE  1 TABLET BY MOUTH DAILY WITH OR IMMEDIATELY FOLLOWING A MEAL  . sitaGLIPtin-metformin ([name]) 50-1000 MG tablet TAKE 1 TABLET BY MOUTH TWICE DAILY WITH A MEAL  . tamoxifen (NOLVADEX) 20 MG tablet TAKE 1 TABLET(20 MG) BY MOUTH DAILY   No facility-administered encounter medications on file as of 06/01/2020.    Allergies (verified) Gabapentin, Quinine, Amoxicillin, Dilaudid  [hydromorphone hcl], Etodolac, Hydrochlorothiazide, Hydrocodone, Hydromorphone, and Sulfa antibiotics   History: Past Medical History:  Diagnosis Date  . Anemia   . Arthritis   . 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 Diabetes mellitus without complication ([place])   . Hyperlipidemia   . Hypertension   . Hyperthyroidism   . Obesity   . Personal history of radiation therapy    Right lumpectomy 2018  . Thyroid disease    hyperthyroidism  . Vitamin D deficiency    Past Surgical History:  Procedure Laterality Date  . BREAST BIOPSY Right 06/28/2016   stereotactic biopsy - DUCTAL CARCINOMA IN SITU (DCIS), HIGH NUCLEAR GRADE  (very narrow margins)  . BREAST LUMPECTOMY Right 07/18/2016  . JOINT REPLACEMENT Left    tkr  . KNEE SURGERY Left    arthroscopy  . MASTECTOMY, PARTIAL Right 07/18/2016   Procedure: MASTECTOMY PARTIAL;  Surgeon: Robert Bellow, MD;  Location: [facility] ORS;  Service: General;  Laterality: Right;  . SKIN  CANCER EXCISION     face   Family History  Problem Relation Age of Onset  . Hypertension Mother   . Cervical cancer Mother   . Heart disease Father   . Hypertension Father   . Lung cancer Father   . Lung cancer Sister   . Brain cancer Sister   . Diabetes Brother   . Heart disease Brother   . Hypertension Brother   . Stroke Brother   . Breast cancer Neg Hx    Social History   Socioeconomic History  . Marital status: Married    Spouse name: Not on file  . Number of children: 1  . Years of education: Not on file  .  Highest education level: Bachelor's degree (e.g., BA, AB, BS)  Occupational History  . Occupation: retired  Tobacco Use  . Smoking status: Never Smoker  . Smokeless tobacco: Never Used  Vaping Use  . Vaping Use: Never used  Substance and Sexual Activity  . Alcohol use: No  . Drug use: No  . Sexual activity: Never  Other Topics Concern  . Not on file  Social History Narrative  . Not on file   Social Determinants of Health   Financial Resource Strain: Low Risk   . Difficulty of Paying Living Expenses: Not very hard  Food Insecurity: No Food Insecurity  . Worried About Charity fundraiser in the Last Year: Never true  . Ran Out of Food in the Last Year: Never true  Transportation Needs: No Transportation Needs  . Lack of Transportation (Medical): No  . Lack of Transportation (Non-Medical): No  Physical Activity: Inactive  . Days of Exercise per Week: 0 days  . Minutes of Exercise per Session: 0 min  Stress: No Stress Concern Present  . Feeling of Stress : Not at all  Social Connections: Moderately Isolated  . Frequency of Communication with Friends and Family: Never  . Frequency of Social Gatherings with Friends and Family: More than three times a week  . Attends Religious Services: Never  . Active Member of Clubs or Organizations: No  . Attends Archivist Meetings: Never  . Marital Status: Married    Tobacco Counseling Counseling given: Not Answered   Clinical Intake:  Pre-visit preparation completed: Yes  Pain : No/denies pain ("Not when sitting, has lower back when walking.") Pain Score: 0-No pain     Nutritional Status: BMI > 30  Obese Nutritional Risks: None Diabetes: Yes  How often do you need to have someone help you when you read instructions, pamphlets, or other written materials from your doctor or pharmacy?: 1 - Never  Diabetic? Yes  Nutrition Risk Assessment:  Has the patient had any N/V/D within the last 2 months?  No  Does the  patient have any non-healing wounds?  No  Has the patient had any unintentional weight loss or weight gain?  No   Diabetes:  Is the patient diabetic?  Yes  If diabetic, was a CBG obtained today?  No  Did the patient bring in their glucometer from home?  No  How often do you monitor your CBG's? 2-3 times a week.   Financial Strains and Diabetes Management:  Are you having any financial strains with the device, your supplies or your medication? No .  Does the patient want to be seen by Chronic Care Management for management of their diabetes?  No  Would the patient like to be referred to a Nutritionist or for Diabetic Management?  No   Diabetic Exams:  Diabetic Eye Exam: Completed 12/30/19 Diabetic Foot Exam: Overdue, Pt has been advised about the importance in completing this exam.    Interpreter Needed?: No  Information entered by :: [facility], LPN   Activities of Daily Living In your present state of health, do you have any difficulty performing the following activities: 06/01/2020 04/02/2020  Hearing? N N  Vision? N N  Comment Wears eye glasses. -  Difficulty concentrating or making decisions? N N  Walking or climbing stairs? Y Y  Comment Due to back or right knee pains. Trouble walking up steps.  Dressing or bathing? N N  Doing errands, shopping? Y N  Comment Does not drive. -  Preparing Food and eating ? N -  Using the Toilet? N -  In the past six months, have you accidently leaked urine? N -  Do you have problems with loss of bowel control? N -  Managing your Medications? N -  Managing your Finances? N -  Housekeeping or managing your Housekeeping? N -  Some recent data might be hidden    Patient Care Team: Jerrol Banana., MD as PCP - General (Family Medicine) Marry Guan, Laurice Record, MD as Consulting Physician (Orthopedic Surgery) Dasher, Rayvon Char, MD as Consulting Physician (Dermatology) Corey Skains, MD as Consulting Physician (Cardiology) Thelma Comp, [place] as Consulting Physician (Optometry) Byrnett, Forest Gleason, MD (General Surgery) Noreene Filbert, MD as Referring Physician (Radiation Oncology) Lloyd Huger, MD as Consulting Physician (Oncology) Neldon Labella, RN as Registered Nurse Germaine Pomfret, [facility] (Pharmacist)  Indicate any recent Medical Services you may have received from other than [facility] providers in the past year (date may be approximate).     Assessment:   This is a routine wellness examination for Charm.  Hearing/Vision screen No exam data present  Dietary issues and exercise activities discussed: Current Exercise Habits: Home exercise routine, Type of exercise: Other - see comments;stretching (PT exercises), Time (Minutes): 15, Frequency (Times/Week): 3, Weekly Exercise (Minutes/Week): 45, Intensity: Mild, Exercise limited by: orthopedic condition(s)  Goals    . Chronic Disease Management     CARE PLAN ENTRY (see longitudinal plan of care for additional care plan information)  Current Barriers:  . Chronic Disease Management support and education needs related to Hypertension, Diabetes and Chronic back pain.  Nurse Case Manager Clinical Goal(s):  Over the next 120 days, patient: . Will monitor fasting blood glucose levels daily and maintain a log.  . Will adhere to diabetes self-management recommendations and decrease A1C by 1 point. . Will monitor BP routinely and record readings. . Will take all medications as prescribed. . Will attend medical appointments as scheduled. . Will follow safety recommendations to prevent falls and injuries. Over the next 30 days, patient: . Will a follow-up with Pain Management provider if knee pain worsens.  Interventions:  . Inter-disciplinary care team collaboration (see longitudinal plan of care) . Reviewed medications. Encouraged to take all medications as prescribed and notify provider if unable to tolerate. Encouraged to keep care management team updated of  concerns regarding medication management or prescription costs. Expressed concerns regarding the cost of [name]. Agreeable to outreach with the CCM Pharmacist regarding available medication assistance.  . Discussed s/sx of hyperglycemia and hypoglycemia along with recommended interventions. Discussed fasting blood glucose levels. Encouraged to monitor daily and maintain a log. Reports not monitoring fasting levels daily.  Reports most fasting readings have ranged from 110 to the 120's. She recalls an  elevated reading of 263 mg/dl on yesterday. Thinks it was likely d/t eating candy. Feels that she is doing fairly well with maintaining a heart healthy/diabetic diet. Most recent A1C was 7/6%.  . Provided information regarding established BP ranges. Encouraged to monitor routinely if unable to monitor daily and record readings.   . Provided information regarding safety and fall prevention measures. Discussed fall risk and limitations d/t chronic back pain and joint stiffness. Encouraged to use an assistive device when needed to prevent accidental falls and injuries. Advised to avoid prolonged and over strenuous activities. Currently working [facility] and receiving injections to her knee and lower back. Reports experiencing moderate relief following the last injections. She plans to follow-up if her knee pain worsens. Denies recent falls   . Reviewed pending/scheduled appointments. Encouraged to attend medical appointments as scheduled to prevent delays in care. Encouraged to notify the care management team with concerns regarding transportation.  . Discussed plans for ongoing care management follow up. Provided direct contact information for the CCM Nurse Case Manager. Overall, feels that she is doing well. Remains independent in the home. Her spouse and son are available to assist if needed. Denies urgent concerns. Will submit request for outreach with the CCM Pharmacist  and follow-up next month.    Patient Self Care Activities:  . Self administers medications as prescribed . Attend all scheduled provider appointments . Call pharmacy for medication refills . Perform ADL's independently . Call provider office for new concerns or questions   Initial goal documentation     . Exercise 3x per week (30 min per time)     Recommend to try the "sit down exercises" for 3 days a week for at least 20-30 minutes.     . Have 3 meals a day     Starting 05/12/15, I will work to make sure I am eating 3 meals a day.      Depression Screen PHQ 2/9 Scores 06/01/2020 04/02/2020 02/19/2020 05/30/2019 09/20/2018 02/28/2018 02/01/2018  PHQ - 2 Score 0 0 0 0 0 0 0  PHQ- 9 Score - - 0 0 - - -    Fall Risk Fall Risk  06/01/2020 02/19/2020 05/30/2019 09/20/2018 02/28/2018  Falls in the past year? 0 0 0 0 No  Number falls in past yr: 0 0 0 - -  Injury with Fall? 0 0 0 - -  Follow up - Falls evaluation completed - - -    FALL RISK PREVENTION PERTAINING TO THE HOME:  Any stairs in or around the home? Yes  If so, are there any without handrails? No  Home free of loose throw rugs in walkways, pet beds, electrical cords, etc? Yes  Adequate lighting in your home to reduce risk of falls? Yes   ASSISTIVE DEVICES UTILIZED TO PREVENT FALLS:  Life alert? No  Use of a cane, walker or w/c? Yes  Grab bars in the bathroom? Yes  Shower chair or bench in shower? Yes  Elevated toilet seat or a handicapped toilet? No    Cognitive Function: Normal cognitive status assessed by direct observation by this Nurse Health Advisor. No abnormalities found.       6CIT Screen 05/30/2019 05/11/2016  What Year? 0 points 0 points  What month? 0 points 0 points  What time? 0 points 0 points  Count back from 20 0 points 0 points  Months in reverse 0 points 0 points  Repeat phrase 0 points 0 points  Total Score 0 0    Immunizations Immunization History  Administered Date(s) Administered  .  Fluad Quad(high Dose 65+) 01/29/2019, 01/30/2020  . Influenza Split 02/25/2006, 03/07/2011, 02/27/2012  . Influenza, High Dose Seasonal PF 03/03/2016, 01/26/2017, 01/08/2018  . Influenza,inj,Quad PF,6+ Mos 03/04/2013  . PFIZER(Purple Top)SARS-COV-2 Vaccination 06/11/2019, 07/02/2019, 01/31/2020  . Pneumococcal Conjugate-13 04/13/2015  . Pneumococcal Polysaccharide-23 02/17/2007, 02/27/2012  . Td 10/21/2003  . Tdap 05/11/2016  . Zoster 11/08/2010    TDAP status: Up to date  Flu Vaccine status: Up to date  Pneumococcal vaccine status: Up to date  Covid-19 vaccine status: Information provided on how to obtain vaccines.   Qualifies for Shingles Vaccine? Yes   Zostavax completed Yes   Shingrix Completed?: No.    Education has been provided regarding the importance of this vaccine. Patient has been advised to call insurance company to determine out of pocket expense if they have not yet received this vaccine. Advised may also receive vaccine at local pharmacy or Health Dept. Verbalized acceptance and understanding.  Screening Tests Health Maintenance  Topic Date Due  . FOOT EXAM  01/26/2018  . DEXA SCAN  06/01/2021 (Originally 11/27/2016)  . COVID-19 Vaccine (4 - Booster for Pfizer series) 07/31/2020  . HEMOGLOBIN A1C  08/19/2020  . OPHTHALMOLOGY EXAM  12/29/2020  . MAMMOGRAM  06/26/2021  . COLONOSCOPY (Pts 45-52yrs Insurance coverage will need to be confirmed)  01/18/2022  . TETANUS/TDAP  05/11/2026  . INFLUENZA VACCINE  Completed  . Hepatitis C Screening  Completed  . PNA vac Low Risk Adult  Completed    Health Maintenance  Health Maintenance Due  Topic Date Due  . FOOT EXAM  01/26/2018    Colorectal cancer screening: Type of screening: Colonoscopy. Completed 01/19/12. Repeat every 10 years  Mammogram status: Completed 06/27/19. Repeat every year  Bone Density status: Currently due, declined an order for a scan at this time.   Lung Cancer Screening: (Low Dose CT Chest  recommended if Age 41-80 years, 30 pack-year currently smoking OR have quit w/in 15years.) does not qualify.    Additional Screening:  Hepatitis C Screening: Up to date  Vision Screening: Recommended annual ophthalmology exams for early detection of glaucoma and other disorders of the eye. Is the patient up to date with their annual eye exam?  Yes  Who is the provider or what is the name of the office in which the patient attends annual eye exams? Dr Rick Duff If pt is not established with a provider, would they like to be referred to a provider to establish care? No .   Dental Screening: Recommended annual dental exams for proper oral hygiene  Community Resource Referral / Chronic Care Management: CRR required this visit?  No   CCM required this visit?  No      Plan:     I have personally reviewed and noted the following in the patient's chart:   . Medical and social history . Use of alcohol, tobacco or illicit drugs  . Current medications and supplements . Functional ability and status . Nutritional status . Physical activity . Advanced directives . List of other physicians . Hospitalizations, surgeries, and ER visits in previous 12 months . Vitals . Screenings to include cognitive, depression, and falls . Referrals and appointments  In addition, I have reviewed and discussed with patient certain preventive protocols, quality metrics, and best practice recommendations. A written personalized care plan for preventive services as well as general preventive health recommendations were provided to patient.  Brandom Kerwin [place], [place]   [id number]   Nurse Notes: Pt needs a diabetic foot exam at next in office apt. Declined scheduling a future DEXA scan.

## 2020-06-01 ENCOUNTER — Ambulatory Visit (INDEPENDENT_AMBULATORY_CARE_PROVIDER_SITE_OTHER): Payer: Medicare Other

## 2020-06-01 ENCOUNTER — Other Ambulatory Visit: Payer: Self-pay

## 2020-06-01 VITALS — BP 132/60 | HR 78 | Temp 98.5°F | Ht 63.0 in | Wt 229.2 lb

## 2020-06-01 DIAGNOSIS — Z0000 Encounter for general adult medical examination without abnormal findings: Secondary | ICD-10-CM | POA: Diagnosis not present

## 2020-06-01 NOTE — Patient Instructions (Signed)
Carol Ramsey , Thank you for taking time to come for your Medicare Wellness Visit. I appreciate your ongoing commitment to your health goals. Please review the following plan we discussed and let me know if I can assist you in the future.   Screening recommendations/referrals: Colonoscopy: Up to date, due 12/2021 Mammogram: Up to date, due 06/2020. Scheduled 06/29/20 Bone Density: Currently due, declined order at this time.  Recommended yearly ophthalmology/optometry visit for glaucoma screening and checkup Recommended yearly dental visit for hygiene and checkup  Vaccinations: Influenza vaccine: Done 01/30/20 Pneumococcal vaccine: Completed series Tdap vaccine: Up to date, due 05/2026 Shingles vaccine: Shingrix discussed. Please contact your pharmacy for coverage information.     Advanced directives: Advance directive discussed with you today. Even though you declined this today please call our office should you change your mind and we can give you the proper paperwork for you to fill out.  Conditions/risks identified: Recommend to try the "sit down exercises" for 3 days a week for at least 20-30 minutes. Also recommend to eat 3 small meals a day with 2 healthy snacks in between.   Next appointment: 06/24/20 @ 10:40 AM with Dr Rosanna Randy    Preventive Care 74 Years and Older, Female Preventive care refers to lifestyle choices and visits with your health care provider that can promote health and wellness. What does preventive care include?  A yearly physical exam. This is also called an annual well check.  Dental exams once or twice a year.  Routine eye exams. Ask your health care provider how often you should have your eyes checked.  Personal lifestyle choices, including:  Daily care of your teeth and gums.  Regular physical activity.  Eating a healthy diet.  Avoiding tobacco and drug use.  Limiting alcohol use.  Practicing safe sex.  Taking low-dose aspirin every day.  Taking  vitamin and mineral supplements as recommended by your health care provider. What happens during an annual well check? The services and screenings done by your health care provider during your annual well check will depend on your age, overall health, lifestyle risk factors, and family history of disease. Counseling  Your health care provider may ask you questions about your:  Alcohol use.  Tobacco use.  Drug use.  Emotional well-being.  Home and relationship well-being.  Sexual activity.  Eating habits.  History of falls.  Memory and ability to understand (cognition).  Work and work Statistician.  Reproductive health. Screening  You may have the following tests or measurements:  Height, weight, and BMI.  Blood pressure.  Lipid and cholesterol levels. These may be checked every 5 years, or more frequently if you are over 68 years old.  Skin check.  Lung cancer screening. You may have this screening every year starting at age 90 if you have a 30-pack-year history of smoking and currently smoke or have quit within the past 15 years.  Fecal occult blood test (FOBT) of the stool. You may have this test every year starting at age 85.  Flexible sigmoidoscopy or colonoscopy. You may have a sigmoidoscopy every 5 years or a colonoscopy every 10 years starting at age 41.  Hepatitis C blood test.  Hepatitis B blood test.  Sexually transmitted disease (STD) testing.  Diabetes screening. This is done by checking your blood sugar (glucose) after you have not eaten for a while (fasting). You may have this done every 1-3 years.  Bone density scan. This is done to screen for osteoporosis. You may have  this done starting at age 42.  Mammogram. This may be done every 1-2 years. Talk to your health care provider about how often you should have regular mammograms. Talk with your health care provider about your test results, treatment options, and if necessary, the need for more  tests. Vaccines  Your health care provider may recommend certain vaccines, such as:  Influenza vaccine. This is recommended every year.  Tetanus, diphtheria, and acellular pertussis (Tdap, Td) vaccine. You may need a Td booster every 10 years.  Zoster vaccine. You may need this after age 71.  Pneumococcal 13-valent conjugate (PCV13) vaccine. One dose is recommended after age 60.  Pneumococcal polysaccharide (PPSV23) vaccine. One dose is recommended after age 72. Talk to your health care provider about which screenings and vaccines you need and how often you need them. This information is not intended to replace advice given to you by your health care provider. Make sure you discuss any questions you have with your health care provider. Document Released: 05/15/2015 Document Revised: 01/06/2016 Document Reviewed: 02/17/2015 Elsevier Interactive Patient Education  2017 [place] Prevention in the Home Falls can cause injuries. They can happen to people of all ages. There are many things you can do to make your home safe and to help prevent falls. What can I do on the outside of my home?  Regularly fix the edges of walkways and driveways and fix any cracks.  Remove anything that might make you trip as you walk through a door, such as a raised step or threshold.  Trim any bushes or trees on the path to your home.  Use bright outdoor lighting.  Clear any walking paths of anything that might make someone trip, such as rocks or tools.  Regularly check to see if handrails are loose or broken. Make sure that both sides of any steps have handrails.  Any raised decks and porches should have guardrails on the edges.  Have any leaves, snow, or ice cleared regularly.  Use sand or salt on walking paths during winter.  Clean up any spills in your garage right away. This includes oil or grease spills. What can I do in the bathroom?  Use night lights.  Install grab bars by the  toilet and in the tub and shower. Do not use towel bars as grab bars.  Use non-skid mats or decals in the tub or shower.  If you need to sit down in the shower, use a plastic, non-slip stool.  Keep the floor dry. Clean up any water that spills on the floor as soon as it happens.  Remove soap buildup in the tub or shower regularly.  Attach bath mats securely with double-sided non-slip rug tape.  Do not have throw rugs and other things on the floor that can make you trip. What can I do in the bedroom?  Use night lights.  Make sure that you have a light by your bed that is easy to reach.  Do not use any sheets or blankets that are too big for your bed. They should not hang down onto the floor.  Have a firm chair that has side arms. You can use this for support while you get dressed.  Do not have throw rugs and other things on the floor that can make you trip. What can I do in the kitchen?  Clean up any spills right away.  Avoid walking on wet floors.  Keep items that you use a lot in easy-to-reach  places.  If you need to reach something above you, use a strong step stool that has a grab bar.  Keep electrical cords out of the way.  Do not use floor polish or wax that makes floors slippery. If you must use wax, use non-skid floor wax.  Do not have throw rugs and other things on the floor that can make you trip. What can I do with my stairs?  Do not leave any items on the stairs.  Make sure that there are handrails on both sides of the stairs and use them. Fix handrails that are broken or loose. Make sure that handrails are as long as the stairways.  Check any carpeting to make sure that it is firmly attached to the stairs. Fix any carpet that is loose or worn.  Avoid having throw rugs at the top or bottom of the stairs. If you do have throw rugs, attach them to the floor with carpet tape.  Make sure that you have a light switch at the top of the stairs and the bottom of  the stairs. If you do not have them, ask someone to add them for you. What else can I do to help prevent falls?  Wear shoes that:  Do not have high heels.  Have rubber bottoms.  Are comfortable and fit you well.  Are closed at the toe. Do not wear sandals.  If you use a stepladder:  Make sure that it is fully opened. Do not climb a closed stepladder.  Make sure that both sides of the stepladder are locked into place.  Ask someone to hold it for you, if possible.  Clearly mark and make sure that you can see:  Any grab bars or handrails.  First and last steps.  Where the edge of each step is.  Use tools that help you move around (mobility aids) if they are needed. These include:  Canes.  Walkers.  Scooters.  Crutches.  Turn on the lights when you go into a dark area. Replace any light bulbs as soon as they burn out.  Set up your furniture so you have a clear path. Avoid moving your furniture around.  If any of your floors are uneven, fix them.  If there are any pets around you, be aware of where they are.  Review your medicines with your doctor. Some medicines can make you feel dizzy. This can increase your chance of falling. Ask your doctor what other things that you can do to help prevent falls. This information is not intended to replace advice given to you by your health care provider. Make sure you discuss any questions you have with your health care provider. Document Released: 02/12/2009 Document Revised: 09/24/2015 Document Reviewed: 05/23/2014 Elsevier Interactive Patient Education  2017 [employer].

## 2020-06-02 ENCOUNTER — Ambulatory Visit (INDEPENDENT_AMBULATORY_CARE_PROVIDER_SITE_OTHER): Payer: Medicare Other

## 2020-06-02 DIAGNOSIS — M5416 Radiculopathy, lumbar region: Secondary | ICD-10-CM | POA: Diagnosis not present

## 2020-06-02 DIAGNOSIS — E119 Type 2 diabetes mellitus without complications: Secondary | ICD-10-CM

## 2020-06-02 DIAGNOSIS — M5451 Vertebrogenic low back pain: Secondary | ICD-10-CM | POA: Diagnosis not present

## 2020-06-02 DIAGNOSIS — M9903 Segmental and somatic dysfunction of lumbar region: Secondary | ICD-10-CM | POA: Diagnosis not present

## 2020-06-02 DIAGNOSIS — M7918 Myalgia, other site: Secondary | ICD-10-CM | POA: Diagnosis not present

## 2020-06-02 DIAGNOSIS — M9905 Segmental and somatic dysfunction of pelvic region: Secondary | ICD-10-CM | POA: Diagnosis not present

## 2020-06-02 DIAGNOSIS — M5136 Other intervertebral disc degeneration, lumbar region: Secondary | ICD-10-CM | POA: Diagnosis not present

## 2020-06-02 NOTE — Chronic Care Management (AMB) (Signed)
[name] Patient Assistance form for [place] completed by patient and faxed for review on 06/02/20.  [place] [phone]

## 2020-06-03 DIAGNOSIS — M9905 Segmental and somatic dysfunction of pelvic region: Secondary | ICD-10-CM | POA: Diagnosis not present

## 2020-06-03 DIAGNOSIS — M7918 Myalgia, other site: Secondary | ICD-10-CM | POA: Diagnosis not present

## 2020-06-03 DIAGNOSIS — M5451 Vertebrogenic low back pain: Secondary | ICD-10-CM | POA: Diagnosis not present

## 2020-06-03 DIAGNOSIS — M9903 Segmental and somatic dysfunction of lumbar region: Secondary | ICD-10-CM | POA: Diagnosis not present

## 2020-06-03 DIAGNOSIS — M5416 Radiculopathy, lumbar region: Secondary | ICD-10-CM | POA: Diagnosis not present

## 2020-06-03 DIAGNOSIS — M5136 Other intervertebral disc degeneration, lumbar region: Secondary | ICD-10-CM | POA: Diagnosis not present

## 2020-06-08 DIAGNOSIS — M5416 Radiculopathy, lumbar region: Secondary | ICD-10-CM | POA: Diagnosis not present

## 2020-06-08 DIAGNOSIS — M5136 Other intervertebral disc degeneration, lumbar region: Secondary | ICD-10-CM | POA: Diagnosis not present

## 2020-06-08 DIAGNOSIS — M5451 Vertebrogenic low back pain: Secondary | ICD-10-CM | POA: Diagnosis not present

## 2020-06-08 DIAGNOSIS — M9905 Segmental and somatic dysfunction of pelvic region: Secondary | ICD-10-CM | POA: Diagnosis not present

## 2020-06-08 DIAGNOSIS — M7918 Myalgia, other site: Secondary | ICD-10-CM | POA: Diagnosis not present

## 2020-06-08 DIAGNOSIS — M9903 Segmental and somatic dysfunction of lumbar region: Secondary | ICD-10-CM | POA: Diagnosis not present

## 2020-06-10 DIAGNOSIS — M5136 Other intervertebral disc degeneration, lumbar region: Secondary | ICD-10-CM | POA: Diagnosis not present

## 2020-06-10 DIAGNOSIS — M7918 Myalgia, other site: Secondary | ICD-10-CM | POA: Diagnosis not present

## 2020-06-10 DIAGNOSIS — M9905 Segmental and somatic dysfunction of pelvic region: Secondary | ICD-10-CM | POA: Diagnosis not present

## 2020-06-10 DIAGNOSIS — M5416 Radiculopathy, lumbar region: Secondary | ICD-10-CM | POA: Diagnosis not present

## 2020-06-10 DIAGNOSIS — M9903 Segmental and somatic dysfunction of lumbar region: Secondary | ICD-10-CM | POA: Diagnosis not present

## 2020-06-10 DIAGNOSIS — M5451 Vertebrogenic low back pain: Secondary | ICD-10-CM | POA: Diagnosis not present

## 2020-06-11 DIAGNOSIS — M9903 Segmental and somatic dysfunction of lumbar region: Secondary | ICD-10-CM | POA: Diagnosis not present

## 2020-06-11 DIAGNOSIS — M5136 Other intervertebral disc degeneration, lumbar region: Secondary | ICD-10-CM | POA: Diagnosis not present

## 2020-06-11 DIAGNOSIS — M9905 Segmental and somatic dysfunction of pelvic region: Secondary | ICD-10-CM | POA: Diagnosis not present

## 2020-06-11 DIAGNOSIS — M5451 Vertebrogenic low back pain: Secondary | ICD-10-CM | POA: Diagnosis not present

## 2020-06-11 DIAGNOSIS — M7918 Myalgia, other site: Secondary | ICD-10-CM | POA: Diagnosis not present

## 2020-06-11 DIAGNOSIS — M5416 Radiculopathy, lumbar region: Secondary | ICD-10-CM | POA: Diagnosis not present

## 2020-06-15 ENCOUNTER — Other Ambulatory Visit: Payer: Self-pay | Admitting: Family Medicine

## 2020-06-15 DIAGNOSIS — M7918 Myalgia, other site: Secondary | ICD-10-CM | POA: Diagnosis not present

## 2020-06-15 DIAGNOSIS — E118 Type 2 diabetes mellitus with unspecified complications: Secondary | ICD-10-CM

## 2020-06-15 DIAGNOSIS — M9903 Segmental and somatic dysfunction of lumbar region: Secondary | ICD-10-CM | POA: Diagnosis not present

## 2020-06-15 DIAGNOSIS — M5416 Radiculopathy, lumbar region: Secondary | ICD-10-CM | POA: Diagnosis not present

## 2020-06-15 DIAGNOSIS — M5136 Other intervertebral disc degeneration, lumbar region: Secondary | ICD-10-CM | POA: Diagnosis not present

## 2020-06-15 DIAGNOSIS — M9905 Segmental and somatic dysfunction of pelvic region: Secondary | ICD-10-CM | POA: Diagnosis not present

## 2020-06-15 DIAGNOSIS — M5451 Vertebrogenic low back pain: Secondary | ICD-10-CM | POA: Diagnosis not present

## 2020-06-17 DIAGNOSIS — M5451 Vertebrogenic low back pain: Secondary | ICD-10-CM | POA: Diagnosis not present

## 2020-06-17 DIAGNOSIS — M7918 Myalgia, other site: Secondary | ICD-10-CM | POA: Diagnosis not present

## 2020-06-17 DIAGNOSIS — M5416 Radiculopathy, lumbar region: Secondary | ICD-10-CM | POA: Diagnosis not present

## 2020-06-17 DIAGNOSIS — M5136 Other intervertebral disc degeneration, lumbar region: Secondary | ICD-10-CM | POA: Diagnosis not present

## 2020-06-17 DIAGNOSIS — M9903 Segmental and somatic dysfunction of lumbar region: Secondary | ICD-10-CM | POA: Diagnosis not present

## 2020-06-17 DIAGNOSIS — M9905 Segmental and somatic dysfunction of pelvic region: Secondary | ICD-10-CM | POA: Diagnosis not present

## 2020-06-18 DIAGNOSIS — M5451 Vertebrogenic low back pain: Secondary | ICD-10-CM | POA: Diagnosis not present

## 2020-06-18 DIAGNOSIS — M9903 Segmental and somatic dysfunction of lumbar region: Secondary | ICD-10-CM | POA: Diagnosis not present

## 2020-06-18 DIAGNOSIS — M9905 Segmental and somatic dysfunction of pelvic region: Secondary | ICD-10-CM | POA: Diagnosis not present

## 2020-06-18 DIAGNOSIS — M5136 Other intervertebral disc degeneration, lumbar region: Secondary | ICD-10-CM | POA: Diagnosis not present

## 2020-06-18 DIAGNOSIS — M5416 Radiculopathy, lumbar region: Secondary | ICD-10-CM | POA: Diagnosis not present

## 2020-06-18 DIAGNOSIS — M7918 Myalgia, other site: Secondary | ICD-10-CM | POA: Diagnosis not present

## 2020-06-19 ENCOUNTER — Other Ambulatory Visit: Payer: Self-pay | Admitting: Family Medicine

## 2020-06-19 DIAGNOSIS — E118 Type 2 diabetes mellitus with unspecified complications: Secondary | ICD-10-CM

## 2020-06-22 DIAGNOSIS — M9905 Segmental and somatic dysfunction of pelvic region: Secondary | ICD-10-CM | POA: Diagnosis not present

## 2020-06-22 DIAGNOSIS — M5136 Other intervertebral disc degeneration, lumbar region: Secondary | ICD-10-CM | POA: Diagnosis not present

## 2020-06-22 DIAGNOSIS — M5451 Vertebrogenic low back pain: Secondary | ICD-10-CM | POA: Diagnosis not present

## 2020-06-22 DIAGNOSIS — M5416 Radiculopathy, lumbar region: Secondary | ICD-10-CM | POA: Diagnosis not present

## 2020-06-22 DIAGNOSIS — M7918 Myalgia, other site: Secondary | ICD-10-CM | POA: Diagnosis not present

## 2020-06-22 DIAGNOSIS — M9903 Segmental and somatic dysfunction of lumbar region: Secondary | ICD-10-CM | POA: Diagnosis not present

## 2020-06-23 DIAGNOSIS — M5451 Vertebrogenic low back pain: Secondary | ICD-10-CM | POA: Diagnosis not present

## 2020-06-23 DIAGNOSIS — M9905 Segmental and somatic dysfunction of pelvic region: Secondary | ICD-10-CM | POA: Diagnosis not present

## 2020-06-23 DIAGNOSIS — M7918 Myalgia, other site: Secondary | ICD-10-CM | POA: Diagnosis not present

## 2020-06-23 DIAGNOSIS — M5416 Radiculopathy, lumbar region: Secondary | ICD-10-CM | POA: Diagnosis not present

## 2020-06-23 DIAGNOSIS — M9903 Segmental and somatic dysfunction of lumbar region: Secondary | ICD-10-CM | POA: Diagnosis not present

## 2020-06-23 DIAGNOSIS — M5136 Other intervertebral disc degeneration, lumbar region: Secondary | ICD-10-CM | POA: Diagnosis not present

## 2020-06-24 ENCOUNTER — Other Ambulatory Visit: Payer: Self-pay

## 2020-06-24 ENCOUNTER — Ambulatory Visit (INDEPENDENT_AMBULATORY_CARE_PROVIDER_SITE_OTHER): Payer: Medicare Other | Admitting: Family Medicine

## 2020-06-24 ENCOUNTER — Encounter: Payer: Self-pay | Admitting: Family Medicine

## 2020-06-24 VITALS — BP 145/50 | HR 60 | Temp 98.1°F | Resp 16 | Wt 225.0 lb

## 2020-06-24 DIAGNOSIS — M5442 Lumbago with sciatica, left side: Secondary | ICD-10-CM | POA: Diagnosis not present

## 2020-06-24 DIAGNOSIS — R600 Localized edema: Secondary | ICD-10-CM

## 2020-06-24 DIAGNOSIS — D0511 Intraductal carcinoma in situ of right breast: Secondary | ICD-10-CM | POA: Diagnosis not present

## 2020-06-24 DIAGNOSIS — N1831 Chronic kidney disease, stage 3a: Secondary | ICD-10-CM | POA: Diagnosis not present

## 2020-06-24 DIAGNOSIS — M461 Sacroiliitis, not elsewhere classified: Secondary | ICD-10-CM | POA: Diagnosis not present

## 2020-06-24 DIAGNOSIS — E668 Other obesity: Secondary | ICD-10-CM | POA: Diagnosis not present

## 2020-06-24 DIAGNOSIS — E119 Type 2 diabetes mellitus without complications: Secondary | ICD-10-CM

## 2020-06-24 DIAGNOSIS — I10 Essential (primary) hypertension: Secondary | ICD-10-CM | POA: Diagnosis not present

## 2020-06-24 DIAGNOSIS — M79605 Pain in left leg: Secondary | ICD-10-CM | POA: Diagnosis not present

## 2020-06-24 DIAGNOSIS — M4606 Spinal enthesopathy, lumbar region: Secondary | ICD-10-CM | POA: Diagnosis not present

## 2020-06-24 DIAGNOSIS — G8929 Other chronic pain: Secondary | ICD-10-CM

## 2020-06-24 LAB — POCT GLYCOSYLATED HEMOGLOBIN (HGB A1C): Hemoglobin A1C: 11.1 % — AB (ref 4.0–5.6)

## 2020-06-24 NOTE — Patient Instructions (Signed)
Start Farxiga 5mg  once daily for 2 weeks, then increase to 10mg  daily for 2 weeks.

## 2020-06-24 NOTE — Progress Notes (Signed)
Established patient visit   Patient: Carol Ramsey   DOB: 12/05/1945   75 y.o. Female  MRN: [id number] Visit Date: 06/24/2020  Today's healthcare provider: [name], MD   Chief Complaint  Patient presents with  . Diabetes   Subjective    HPI  Patient comes in today for follow-up and is mentally not been doing well with her diabetes.  Blood sugars are up. She asked about paperwork for a scooter but I do not have any paperwork regarding a scooter. She certainly has some enough mobility problems where it might be helpful to her.  She walks with a cane.  She is very immobile.  This was documented in assessment and plan from May of last year.  Never saw any paperwork for this. Diabetes Mellitus Type II, follow-up  Lab Results  Component Value Date   HGBA1C 11.1 (A) 06/24/2020   HGBA1C 7.6 (H) 02/19/2020   HGBA1C 8.3 (A) 09/26/2019   Last seen for diabetes 4 months ago.  Management since then includes; controlled on pioglitazone [name] and glipizide.  Consider stopping glipizide in the future in this 75 year old. She reports good compliance with treatment. She is not having side effects.   Home blood sugar records: fasting range: 140s  Episodes of hypoglycemia? No    Current insulin regiment: none Most Recent Eye Exam: 12/2019  Hypertension, follow-up  BP Readings from Last 3 Encounters:  06/24/20 (!) 145/50  06/01/20 132/60  02/19/20 (!) 150/68   Wt Readings from Last 3 Encounters:  06/24/20 225 lb (102.1 kg)  06/01/20 229 lb 3.2 oz (104 kg)  02/19/20 239 lb (108.4 kg)     She was last seen for hypertension 4 months ago.  BP at that visit was 150/68. Management since that visit includes; Good control on Zestoretic and Toprol. She reports good compliance with treatment. She is not having side effects.  She is not exercising. She is adherent to low salt diet.   Outside blood pressures are checked occasionally.  She does not smoke.  Use of agents  associated with hypertension: none.    Lumbar facet arthropathy (Bilateral) From 02/19/2020-Per pain clinic.  Extreme obesity From 02/19/2020-Patient working on dietary changes.  She has lost 9 pounds.     Medications: Outpatient Medications Prior to Visit  Medication Sig  . acetaminophen (TYLENOL) 500 MG tablet Take 500 mg by mouth every 6 (six) hours as needed for mild pain or moderate pain.  [name] ascorbic acid (VITAMIN C) 500 MG tablet Take 500 mg by mouth daily.  [name] atorvastatin (LIPITOR) 10 MG tablet Take 10 mg by mouth daily at 6 PM.   . Cholecalciferol (VITAMIN D) 125 MCG (5000 UT) CAPS Take 5,000 Units by mouth daily.  . Cyanocobalamin (VITAMIN B-12) 5000 MCG SUBL Place 5,000 mcg under the tongue daily.  . furosemide (LASIX) 20 MG tablet TAKE 1 TABLET BY MOUTH EVERY DAY  . glipiZIDE (GLUCOTROL XL) 5 MG 24 hr tablet TAKE 1 TABLET BY MOUTH DAILY  . glucose blood (ONETOUCH VERIO) test strip Use as instructed; Check blood sugar twice daily  . [name] 50-1000 MG tablet TAKE 1 TABLET BY MOUTH TWICE DAILY WITH A MEAL  . lisinopril-hydrochlorothiazide (ZESTORETIC) 20-12.5 MG tablet TAKE 2 TABLETS BY MOUTH EVERY MORNING  . metoprolol succinate (TOPROL-XL) 100 MG 24 hr tablet TAKE 1 TABLET BY MOUTH DAILY WITH OR IMMEDIATELY FOLLOWING A MEAL  . tamoxifen (NOLVADEX) 20 MG tablet TAKE 1 TABLET(20 MG) BY MOUTH DAILY  .  Magnesium 500 MG CAPS Take 1 capsule (500 mg total) by mouth at bedtime.   No facility-administered medications prior to visit.    Review of Systems  Constitutional: Negative for appetite change, chills, fatigue and fever.  Respiratory: Negative for chest tightness and shortness of breath.   Cardiovascular: Negative for chest pain and palpitations.  Gastrointestinal: Negative for abdominal pain, nausea and vomiting.  Neurological: Negative for dizziness and weakness.       Objective    BP (!) 145/50   Pulse 60   Temp 98.1 F (36.7 C)   Resp 16   Wt 225 lb (102.1  kg)   BMI 39.86 kg/m     Physical Exam Vitals and nursing note reviewed.  Constitutional:      Appearance: Normal appearance. She is normal weight.  HENT:     Right Ear: Tympanic membrane normal.     Left Ear: Tympanic membrane normal.     Nose: Nose normal.     Mouth/Throat:     Mouth: Mucous membranes are moist.     Pharynx: Oropharynx is clear.  Eyes:     Conjunctiva/sclera: Conjunctivae normal.  Cardiovascular:     Rate and Rhythm: Normal rate and regular rhythm.     Pulses: Normal pulses.     Heart sounds: Normal heart sounds.  Pulmonary:     Effort: Pulmonary effort is normal.     Breath sounds: Normal breath sounds.  Abdominal:     General: Bowel sounds are normal.     Palpations: Abdomen is soft.  Musculoskeletal:     Cervical back: Normal range of motion and neck supple.     Comments: She has 1+ lower extremity edema plus lymphedema. She ambulates with great difficulty with a cane.  Skin:    General: Skin is warm and dry.     Comments: Very fair skin.  Neurological:     General: No focal deficit present.     Mental Status: She is alert and oriented to person, place, and time.  Psychiatric:        Mood and Affect: Mood normal.        Behavior: Behavior normal.        Thought Content: Thought content normal.        Judgment: Judgment normal.       Results for orders placed or performed in visit on 06/24/20  POCT glycosylated hemoglobin (Hb A1C)  Result Value Ref Range   Hemoglobin A1C 11.1 (A) 4.0 - 5.6 %   HbA1c POC (<> result, manual entry)     HbA1c, POC (prediabetic range)     HbA1c, POC (controlled diabetic range)      Assessment & Plan     1. Type 2 diabetes mellitus without complication, without long-term current use of insulin  A1c has gone from 7.6-11.1 in the last few months.  Very poor control.  Add Farxiga 5 mg for 2 weeks and then to 10 mg.  Recheck in a month.  She is given samples.  This seems to be preferred on her insurance per  epic - POCT glycosylated hemoglobin (Hb A1C)  2. Stage 3a chronic kidney disease ([place]) Hopefully [name] will protect her kidney function  3. Osteoarthritis of left sacroiliac joint ([place])   4. Morbid obesity ([place]) Major issue in this patient.  I think this keeps her from moving well.  Weight loss stressed. I am happy to try to fill out paperwork for a scooter or some sort of  mobility device.  I am not sure what she would really qualify for.  Please see note from May 2021 5. Essential (primary) hypertension Good control  6. 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  7. Bilateral leg edema Advised patient wear support hose  8. Chronic lower extremity pain (Secondary Area of Pain) (Left)   9. Ductal carcinoma in situ (DCIS) of right breast On tamoxifen I think for 1 more year      No follow-ups on file.         Richard Cranford Mon, MD  [facility] [phone] (phone) [phone] (fax)  [place]

## 2020-06-25 DIAGNOSIS — M9905 Segmental and somatic dysfunction of pelvic region: Secondary | ICD-10-CM | POA: Diagnosis not present

## 2020-06-25 DIAGNOSIS — M5416 Radiculopathy, lumbar region: Secondary | ICD-10-CM | POA: Diagnosis not present

## 2020-06-25 DIAGNOSIS — M5136 Other intervertebral disc degeneration, lumbar region: Secondary | ICD-10-CM | POA: Diagnosis not present

## 2020-06-25 DIAGNOSIS — M7918 Myalgia, other site: Secondary | ICD-10-CM | POA: Diagnosis not present

## 2020-06-25 DIAGNOSIS — M5451 Vertebrogenic low back pain: Secondary | ICD-10-CM | POA: Diagnosis not present

## 2020-06-25 DIAGNOSIS — M25761 Osteophyte, right knee: Secondary | ICD-10-CM | POA: Diagnosis not present

## 2020-06-25 DIAGNOSIS — M9903 Segmental and somatic dysfunction of lumbar region: Secondary | ICD-10-CM | POA: Diagnosis not present

## 2020-06-29 ENCOUNTER — Ambulatory Visit
Admission: RE | Admit: 2020-06-29 | Discharge: 2020-06-29 | Disposition: A | Discharge status: Home / Self Care | Payer: Medicare Other | Source: Ambulatory Visit | Attending: Oncology | Admitting: Oncology

## 2020-06-29 ENCOUNTER — Other Ambulatory Visit: Payer: Self-pay

## 2020-06-29 DIAGNOSIS — Z1231 Encounter for screening mammogram for malignant neoplasm of breast: Secondary | ICD-10-CM | POA: Insufficient documentation

## 2020-06-30 ENCOUNTER — Telehealth: Payer: Self-pay

## 2020-06-30 DIAGNOSIS — M7918 Myalgia, other site: Secondary | ICD-10-CM | POA: Diagnosis not present

## 2020-06-30 DIAGNOSIS — M5451 Vertebrogenic low back pain: Secondary | ICD-10-CM | POA: Diagnosis not present

## 2020-06-30 DIAGNOSIS — M5416 Radiculopathy, lumbar region: Secondary | ICD-10-CM | POA: Diagnosis not present

## 2020-06-30 DIAGNOSIS — M5136 Other intervertebral disc degeneration, lumbar region: Secondary | ICD-10-CM | POA: Diagnosis not present

## 2020-06-30 DIAGNOSIS — M9903 Segmental and somatic dysfunction of lumbar region: Secondary | ICD-10-CM | POA: Diagnosis not present

## 2020-06-30 DIAGNOSIS — M9905 Segmental and somatic dysfunction of pelvic region: Secondary | ICD-10-CM | POA: Diagnosis not present

## 2020-06-30 NOTE — Telephone Encounter (Signed)
  Chronic Care Management   Outreach Note  06/30/2020 Name: [name] MRN: [id number] DOB: 14-Apr-1946  Primary Care Provider: Jerrol Banana., MD Reason for referral : Chronic Care Management   An unsuccessful telephone outreach was attempted today. Mrs. Carol Ramsey is currently enrolled in the Chronic Care Management program.   Follow Up Plan:  A member of the care management team will attempt to reach [name] again within the next weeks.   [name] Care [facility] [phone]

## 2020-07-02 DIAGNOSIS — M9905 Segmental and somatic dysfunction of pelvic region: Secondary | ICD-10-CM | POA: Diagnosis not present

## 2020-07-02 DIAGNOSIS — M9903 Segmental and somatic dysfunction of lumbar region: Secondary | ICD-10-CM | POA: Diagnosis not present

## 2020-07-02 DIAGNOSIS — M5136 Other intervertebral disc degeneration, lumbar region: Secondary | ICD-10-CM | POA: Diagnosis not present

## 2020-07-02 DIAGNOSIS — M7918 Myalgia, other site: Secondary | ICD-10-CM | POA: Diagnosis not present

## 2020-07-02 DIAGNOSIS — M5451 Vertebrogenic low back pain: Secondary | ICD-10-CM | POA: Diagnosis not present

## 2020-07-02 DIAGNOSIS — M5416 Radiculopathy, lumbar region: Secondary | ICD-10-CM | POA: Diagnosis not present

## 2020-07-13 ENCOUNTER — Ambulatory Visit (INDEPENDENT_AMBULATORY_CARE_PROVIDER_SITE_OTHER): Payer: Medicare Other

## 2020-07-13 DIAGNOSIS — E119 Type 2 diabetes mellitus without complications: Secondary | ICD-10-CM | POA: Diagnosis not present

## 2020-07-13 DIAGNOSIS — I10 Essential (primary) hypertension: Secondary | ICD-10-CM | POA: Diagnosis not present

## 2020-07-13 NOTE — Chronic Care Management (AMB) (Signed)
Chronic Care Management   Follow Up Note   07/13/2020 Name: Carol Ramsey MRN: [id number] DOB: 1945/07/15  Primary Care Provider: Jerrol Banana., MD Reason for referral : Chronic Care Management   Carol Ramsey is a 75 y.o. year old female who is a primary care patient of Jerrol Banana., MD. She is currently engaged with the chronic care management team. A routine telephonic outreach was conducted today.  Review of Carol Ramsey's status, including review of consultants reports, relevant labs and test results was conducted today. Collaboration with appropriate care team members was performed as part of the comprehensive evaluation and provision of chronic care management services.    SDOH (Social Determinants of Health) assessments performed: No   Outpatient Encounter Medications as of 07/13/2020  Medication Sig Note  . acetaminophen (TYLENOL) 500 MG tablet Take 500 mg by mouth every 6 (six) hours as needed for mild pain or moderate pain.   [name] ascorbic acid (VITAMIN C) 500 MG tablet Take 500 mg by mouth daily.   [name] atorvastatin (LIPITOR) 10 MG tablet Take 10 mg by mouth daily at 6 PM.    . Cholecalciferol (VITAMIN D) 125 MCG (5000 UT) CAPS Take 5,000 Units by mouth daily.   . Cyanocobalamin (VITAMIN B-12) 5000 MCG SUBL Place 5,000 mcg under the tongue daily.   . furosemide (LASIX) 20 MG tablet TAKE 1 TABLET BY MOUTH EVERY DAY 04/02/2020: Reports taking as needed for edema  . glipiZIDE (GLUCOTROL XL) 5 MG 24 hr tablet TAKE 1 TABLET BY MOUTH DAILY   . glucose blood (ONETOUCH VERIO) test strip Use as instructed; Check blood sugar twice daily   . [name] 50-1000 MG tablet TAKE 1 TABLET BY MOUTH TWICE DAILY WITH A MEAL   . lisinopril-hydrochlorothiazide (ZESTORETIC) 20-12.5 MG tablet TAKE 2 TABLETS BY MOUTH EVERY MORNING   . Magnesium 500 MG CAPS Take 1 capsule (500 mg total) by mouth at bedtime.   . metoprolol succinate (TOPROL-XL) 100 MG 24 hr tablet TAKE 1 TABLET BY MOUTH  DAILY WITH OR IMMEDIATELY FOLLOWING A MEAL   . tamoxifen (NOLVADEX) 20 MG tablet TAKE 1 TABLET(20 MG) BY MOUTH DAILY    No facility-administered encounter medications on file as of 07/13/2020.     Objective:  Patient Care Plan: Diabetes Type 2 (Adult)    Problem Identified: Disease Progression (Diabetes, Type 2)     Long-Range Goal: Disease Progression Prevented or Minimized   Start Date: 07/13/2020  Expected End Date: 11/10/2020  Priority: High  Note:   Objective:  Lab Results  Component Value Date   HGBA1C 11.1 (A) 06/24/2020 .   Lab Results  Component Value Date   CREATININE 1.20 (H) 02/19/2020   CREATININE 1.21 (H) 05/30/2019   CREATININE 1.41 (H) 01/29/2019 .   [name] No results found for: EGFR   Current Barriers:  . Chronic Disease Management support and educational needs r/t Diabetes self-management   Case Manager Clinical Goal(s):  [name] Over the next 120 days, patient will demonstrate improved adherence to prescribed treatment plan for Diabetes self-management as evidenced by taking medications as prescribed, daily monitoring and recording of CBG and adherence to ADA/ carb modified diet  Interventions:  . Collaboration with Jerrol Banana., MD regarding development and update of comprehensive plan of care as evidenced by provider attestation and co-signature . Inter-disciplinary care team collaboration (see longitudinal plan of care) . Reviewed medications. Reports taking as prescribed however Diabetes remains uncontrolled. A1C recently increased from 7.6  to 11.1 %. Recently received samples of Farxiga and reports significant improvements with her fasting blood glucose readings. She is currently engaged with the CCM Pharmacist for medication assistance. Will update Pharmacist regarding [name].  . Discussed blood glucose readings. Reports not monitoring daily. Fasting readings within the last week were in the 150's. Reports this is a significant improvement compared to  previous fasting readings in the mid to high 200's. Denies low readings. Encouraged to monitor blood glucose consistently and maintain a log. . Discussed nutritional intake and compliance with an ADA/modified carb diet. She feels that she is doing well with her diet. Declined need for nutritional resources.  . Discussed and offered referrals for available Diabetes education. She declined need for additional referrals or resources. Reports that her home readings are improving. She anticipates significant improvements if she remains on Farxiga. . Discussed importance of completing recommended DM preventive care. She is due for a foot exam. Reports completing an eye exam within the last year.    Patient Goals/Self-Care Activities Over the next 120 days, patient will:  - Self-administer medications as prescribed - Attend all scheduled provider appointments - Monitor blood glucose levels consistently and utilize recommended interventions - Adhere to prescribed ADA/carb modified - Notify provider or care management team with questions and new concerns as needed   Follow Up Plan:  Will follow up within the next month     Patient Care Plan: Hypertension and Hyperlipidemia    Problem Identified: Hypertension and Hyperlipidemia     Long-Range Goal: Hypertension and Hyperlipidemia Monitored   Start Date: 07/13/2020  Expected End Date: 11/10/2020  Priority: High  Note:   Objective:  . Last practice recorded BP readings:  BP Readings from Last 3 Encounters:  06/24/20 (!) 145/50  06/01/20 132/60  02/19/20 (!) 150/68 .   [name] Most recent eGFR/CrCl: No results found for: EGFR  No components found for: CRCL  Lab Results  Component Value Date   CHOL 121 02/19/2020   HDL 61 02/19/2020   LDLCALC 42 02/19/2020   TRIG 97 02/19/2020   CHOLHDL 2.0 02/19/2020     Current Barriers:  . Chronic Disease Management support and educational needs r/t Hypertension and Hyperlipidemia.  Case Manager  Clinical Goal(s):  [name] Over the next 120 days, patient will demonstrate improved adherence to prescribed treatment plan as evidenced by taking all medications as prescribed, monitoring, and recording blood pressure and adhering to a cardiac prudent/heart healthy diet.  Interventions:  . Collaboration with Jerrol Banana., MD regarding development and update of comprehensive plan of care as evidenced by provider attestation and co-signature . Inter-disciplinary care team collaboration (see longitudinal plan of care) . Reviewed medications. Encouraged to continue taking as prescribed and notify provider if unable to tolerate prescribed regimen.  . Discussed BP readings. Reports not monitoring at home. Provided information regarding established blood pressure parameters along with indications for notifying a provider. Encouraged to monitor and record readings. . Discussed compliance with recommended diet. Reports doing well with her diet. Declined need for educational resources.    Patient Goals/Self-Care Activities: -Self-administer medications as prescribed -Attend all scheduled provider appointments -Monitor and record blood pressure -Adhere to recommended cardiac prudent/heart healthy diet -Notify provider or care management team with questions and new concerns as needed   Follow Up Plan:  Will follow up within the next month          PLAN A member of the care management team will follow up with Mrs. Zhao within  the next month.    [name] [facility] Care Management [facility] [phone]

## 2020-07-13 NOTE — Patient Instructions (Signed)
Thank you for allowing the Chronic Care Management team to participate in your care.     Patient Care Plan: Diabetes Type 2 (Adult)    Problem Identified: Disease Progression (Diabetes, Type 2)     Long-Range Goal: Disease Progression Prevented or Minimized   Start Date: 07/13/2020  Expected End Date: 11/10/2020  Priority: High  Note:   Objective:  Lab Results  Component Value Date   HGBA1C 11.1 (A) 06/24/2020 .   Lab Results  Component Value Date   CREATININE 1.20 (H) 02/19/2020   CREATININE 1.21 (H) 05/30/2019   CREATININE 1.41 (H) 01/29/2019 .   [name] No results found for: EGFR   Current Barriers:  . Chronic Disease Management support and educational needs r/t Diabetes self-management   Case Manager Clinical Goal(s):  [name] Over the next 120 days, patient will demonstrate improved adherence to prescribed treatment plan for Diabetes self-management as evidenced by taking medications as prescribed, daily monitoring and recording of CBG and adherence to ADA/ carb modified diet  Interventions:  . Collaboration with Jerrol Banana., MD regarding development and update of comprehensive plan of care as evidenced by provider attestation and co-signature . Inter-disciplinary care team collaboration (see longitudinal plan of care) . Reviewed medications. Reports taking as prescribed however Diabetes remains uncontrolled. A1C recently increased from 7.6 to 11.1 %. Recently received samples of Farxiga and reports significant improvements with her fasting blood glucose readings. She is currently engaged with the CCM Pharmacist for medication assistance. Will update Pharmacist regarding [name].  . Discussed blood glucose readings. Reports not monitoring daily. Fasting readings within the last week were in the 150's. Reports this is a significant improvement compared to previous fasting readings in the mid to high 200's. Denies low readings. Encouraged to monitor blood glucose consistently  and maintain a log. . Discussed nutritional intake and compliance with an ADA/modified carb diet. She feels that she is doing well with her diet. Declined need for nutritional resources.  . Discussed and offered referrals for available Diabetes education. She declined need for additional referrals or resources. Reports that her home readings are improving. She anticipates significant improvements if she remains on Farxiga. . Discussed importance of completing recommended DM preventive care. She is due for a foot exam. Reports completing an eye exam within the last year.    Patient Goals/Self-Care Activities Over the next 120 days, patient will:  - Self-administer medications as prescribed - Attend all scheduled provider appointments - Monitor blood glucose levels consistently and utilize recommended interventions - Adhere to prescribed ADA/carb modified - Notify provider or care management team with questions and new concerns as needed   Follow Up Plan:  Will follow up within the next month     Patient Care Plan: Hypertension and Hyperlipidemia    Problem Identified: Hypertension and Hyperlipidemia     Long-Range Goal: Hypertension and Hyperlipidemia Monitored   Start Date: 07/13/2020  Expected End Date: 11/10/2020  Priority: High  Note:   Objective:  . Last practice recorded BP readings:  BP Readings from Last 3 Encounters:  06/24/20 (!) 145/50  06/01/20 132/60  02/19/20 (!) 150/68 .   [name] Most recent eGFR/CrCl: No results found for: EGFR  No components found for: CRCL  Lab Results  Component Value Date   CHOL 121 02/19/2020   HDL 61 02/19/2020   LDLCALC 42 02/19/2020   TRIG 97 02/19/2020   CHOLHDL 2.0 02/19/2020     Current Barriers:  . Chronic Disease Management support and educational  needs r/t Hypertension and Hyperlipidemia.  Case Manager Clinical Goal(s):  [name] Over the next 120 days, patient will demonstrate improved adherence to prescribed treatment plan as  evidenced by taking all medications as prescribed, monitoring, and recording blood pressure and adhering to a cardiac prudent/heart healthy diet.  Interventions:  . Collaboration with Jerrol Banana., MD regarding development and update of comprehensive plan of care as evidenced by provider attestation and co-signature . Inter-disciplinary care team collaboration (see longitudinal plan of care) . Reviewed medications. Encouraged to continue taking as prescribed and notify provider if unable to tolerate prescribed regimen.  . Discussed BP readings. Reports not monitoring at home. Provided information regarding established blood pressure parameters along with indications for notifying a provider. Encouraged to monitor and record readings. . Discussed compliance with recommended diet. Reports doing well with her diet. Declined need for educational resources.    Patient Goals/Self-Care Activities: -Self-administer medications as prescribed -Attend all scheduled provider appointments -Monitor and record blood pressure -Adhere to recommended cardiac prudent/heart healthy diet -Notify provider or care management team with questions and new concerns as needed   Follow Up Plan:  Will follow up within the next month           Carol Ramsey verbalized understanding of the information discussed during the telephonic outreach today. Declined need for mailed/printed instructions. A member of the care management team will follow up with Mrs. Cronk within the next month.    [name] [facility] Care Management [facility] [phone]

## 2020-07-14 DIAGNOSIS — M5451 Vertebrogenic low back pain: Secondary | ICD-10-CM | POA: Diagnosis not present

## 2020-07-14 DIAGNOSIS — M7918 Myalgia, other site: Secondary | ICD-10-CM | POA: Diagnosis not present

## 2020-07-14 DIAGNOSIS — M5136 Other intervertebral disc degeneration, lumbar region: Secondary | ICD-10-CM | POA: Diagnosis not present

## 2020-07-14 DIAGNOSIS — M9903 Segmental and somatic dysfunction of lumbar region: Secondary | ICD-10-CM | POA: Diagnosis not present

## 2020-07-14 DIAGNOSIS — M9905 Segmental and somatic dysfunction of pelvic region: Secondary | ICD-10-CM | POA: Diagnosis not present

## 2020-07-14 DIAGNOSIS — M5416 Radiculopathy, lumbar region: Secondary | ICD-10-CM | POA: Diagnosis not present

## 2020-07-16 DIAGNOSIS — M9903 Segmental and somatic dysfunction of lumbar region: Secondary | ICD-10-CM | POA: Diagnosis not present

## 2020-07-16 DIAGNOSIS — M5136 Other intervertebral disc degeneration, lumbar region: Secondary | ICD-10-CM | POA: Diagnosis not present

## 2020-07-16 DIAGNOSIS — M7918 Myalgia, other site: Secondary | ICD-10-CM | POA: Diagnosis not present

## 2020-07-16 DIAGNOSIS — M5416 Radiculopathy, lumbar region: Secondary | ICD-10-CM | POA: Diagnosis not present

## 2020-07-16 DIAGNOSIS — M9905 Segmental and somatic dysfunction of pelvic region: Secondary | ICD-10-CM | POA: Diagnosis not present

## 2020-07-16 DIAGNOSIS — M5451 Vertebrogenic low back pain: Secondary | ICD-10-CM | POA: Diagnosis not present

## 2020-07-21 ENCOUNTER — Telehealth: Payer: Self-pay

## 2020-07-21 DIAGNOSIS — E118 Type 2 diabetes mellitus with unspecified complications: Secondary | ICD-10-CM | POA: Diagnosis not present

## 2020-07-21 DIAGNOSIS — Z96652 Presence of left artificial knee joint: Secondary | ICD-10-CM | POA: Diagnosis not present

## 2020-07-21 NOTE — Chronic Care Management (AMB) (Signed)
    Chronic Care Management Pharmacy Assistant   Name: Katasha L Ruderman  MRN: [id number] DOB: 03-16-1946  Reason for Encounter: Patient Assistance Coordination   07/21/2020- Patient assistance forms filled out for Farxiga 10 mg with [name].  07/22/2020- Called patient to inform application has been filled out. No answer, left message that I will place application in the mail with all of the area's highlighted and tagged and income documentation needed will be highlighted. Instructed patient to bring completed form with income documentation to PCP office for Dr. Rosanna Randy to [place] and [name] to fax. Patient may return call with questions.    Medications: Outpatient Encounter Medications as of 07/21/2020  Medication Sig Note  . acetaminophen (TYLENOL) 500 MG tablet Take 500 mg by mouth every 6 (six) hours as needed for mild pain or moderate pain.   [name] ascorbic acid (VITAMIN C) 500 MG tablet Take 500 mg by mouth daily.   [name] atorvastatin (LIPITOR) 10 MG tablet Take 10 mg by mouth daily at 6 PM.    . Cholecalciferol (VITAMIN D) 125 MCG (5000 UT) CAPS Take 5,000 Units by mouth daily.   . Cyanocobalamin (VITAMIN B-12) 5000 MCG SUBL Place 5,000 mcg under the tongue daily.   . furosemide (LASIX) 20 MG tablet TAKE 1 TABLET BY MOUTH EVERY DAY 04/02/2020: Reports taking as needed for edema  . glipiZIDE (GLUCOTROL XL) 5 MG 24 hr tablet TAKE 1 TABLET BY MOUTH DAILY   . glucose blood (ONETOUCH VERIO) test strip Use as instructed; Check blood sugar twice daily   . [name] 50-1000 MG tablet TAKE 1 TABLET BY MOUTH TWICE DAILY WITH A MEAL   . lisinopril-hydrochlorothiazide (ZESTORETIC) 20-12.5 MG tablet TAKE 2 TABLETS BY MOUTH EVERY MORNING   . Magnesium 500 MG CAPS Take 1 capsule (500 mg total) by mouth at bedtime.   . metoprolol succinate (TOPROL-XL) 100 MG 24 hr tablet TAKE 1 TABLET BY MOUTH DAILY WITH OR IMMEDIATELY FOLLOWING A MEAL   . tamoxifen (NOLVADEX) 20 MG tablet TAKE 1 TABLET(20 MG)  BY MOUTH DAILY    No facility-administered encounter medications on file as of 07/21/2020.    Star Rating Drugs: Atrovastatin 10 mg- Last filled 06/15/2020 for 90 day supply at [facility]. Pioglitazone 45 mg- Last filled 05/17/2019 for 90 day supply at [facility]. [name] 50/1000mg - Last filled 06/15/2020 for 90 day supply at [place] filled 11/27/2018 at [facility] Glipizide 5 mg- Last filled 07/08/2019 for 90 day supply at [facility].    SIG: Pattricia Boss, [place] Pharmacist Assistant [phone]

## 2020-07-22 ENCOUNTER — Encounter: Payer: Self-pay | Admitting: Family Medicine

## 2020-07-22 ENCOUNTER — Other Ambulatory Visit: Payer: Self-pay

## 2020-07-22 ENCOUNTER — Ambulatory Visit (INDEPENDENT_AMBULATORY_CARE_PROVIDER_SITE_OTHER): Payer: Medicare Other | Admitting: Family Medicine

## 2020-07-22 VITALS — BP 133/82 | HR 55 | Temp 98.0°F | Resp 16 | Ht 63.0 in | Wt 218.0 lb

## 2020-07-22 DIAGNOSIS — I10 Essential (primary) hypertension: Secondary | ICD-10-CM | POA: Diagnosis not present

## 2020-07-22 DIAGNOSIS — E7800 Pure hypercholesterolemia, unspecified: Secondary | ICD-10-CM | POA: Diagnosis not present

## 2020-07-22 DIAGNOSIS — E668 Other obesity: Secondary | ICD-10-CM

## 2020-07-22 DIAGNOSIS — G8929 Other chronic pain: Secondary | ICD-10-CM

## 2020-07-22 DIAGNOSIS — D0511 Intraductal carcinoma in situ of right breast: Secondary | ICD-10-CM

## 2020-07-22 DIAGNOSIS — M5442 Lumbago with sciatica, left side: Secondary | ICD-10-CM

## 2020-07-22 DIAGNOSIS — E119 Type 2 diabetes mellitus without complications: Secondary | ICD-10-CM | POA: Diagnosis not present

## 2020-07-22 NOTE — Progress Notes (Signed)
Established patient visit   Patient: Carol Ramsey   DOB: 1946-04-04   75 y.o. Female  MRN: [id number] Visit Date: 07/22/2020  Today's healthcare provider: [name], MD   Chief Complaint  Patient presents with  . Diabetes  . Obesity   Subjective    HPI  Patient continues to have chronic back pain and decreased mobility She has normal arm strength sure if she could self propel a wheelchair in her home. She has had no recent falls.  Farxiga 10 mg is really helped her blood sugars with fastings in from 300 down to 120 recently.  Having some symptoms of infection but feels good about her blood sugars.,  Just itching Diabetes Mellitus Type II, follow-up  Lab Results  Component Value Date   HGBA1C 11.1 (A) 06/24/2020   HGBA1C 7.6 (H) 02/19/2020   HGBA1C 8.3 (A) 09/26/2019   Last seen for diabetes 1 months ago.  Management since then includes; A1c has gone from 7.6-11.1 in the last few months.  Very poor control.  Add Farxiga 5 mg for 2 weeks and then to 10 mg.  Recheck in a month.  She is given samples.  This seems to be preferred on her insurance per epic. She reports good compliance with treatment. She is having side effects. Patient reports that she has a yeast infection after starting the new medication.   Home blood sugar records: fasting range: 120s  Episodes of hypoglycemia? No    Current insulin regiment: none Most Recent Eye Exam: up to date  Morbid obesity ([place]) From 06/24/2020-Major issue in this patient.  I think this keeps her from moving well.  Weight loss stressed. I am happy to try to fill out paperwork for a scooter or some sort of mobility device.  I am not sure what she would really qualify for.  Please see note from May 2021.       Medications: Outpatient Medications Prior to Visit  Medication Sig  . acetaminophen (TYLENOL) 500 MG tablet Take 500 mg by mouth every 6 (six) hours as needed for mild pain or moderate pain.  [name] ascorbic  acid (VITAMIN C) 500 MG tablet Take 500 mg by mouth daily.  [name] atorvastatin (LIPITOR) 10 MG tablet Take 10 mg by mouth daily at 6 PM.   . Cholecalciferol (VITAMIN D) 125 MCG (5000 UT) CAPS Take 5,000 Units by mouth daily.  . Cyanocobalamin (VITAMIN B-12) 5000 MCG SUBL Place 5,000 mcg under the tongue daily.  . furosemide (LASIX) 20 MG tablet TAKE 1 TABLET BY MOUTH EVERY DAY  . glipiZIDE (GLUCOTROL XL) 5 MG 24 hr tablet TAKE 1 TABLET BY MOUTH DAILY  . glucose blood (ONETOUCH VERIO) test strip Use as instructed; Check blood sugar twice daily  . [name] 50-1000 MG tablet TAKE 1 TABLET BY MOUTH TWICE DAILY WITH A MEAL  . lisinopril-hydrochlorothiazide (ZESTORETIC) 20-12.5 MG tablet TAKE 2 TABLETS BY MOUTH EVERY MORNING  . Magnesium 500 MG CAPS Take 1 capsule (500 mg total) by mouth at bedtime.  . metoprolol succinate (TOPROL-XL) 100 MG 24 hr tablet TAKE 1 TABLET BY MOUTH DAILY WITH OR IMMEDIATELY FOLLOWING A MEAL  . tamoxifen (NOLVADEX) 20 MG tablet TAKE 1 TABLET(20 MG) BY MOUTH DAILY   No facility-administered medications prior to visit.    Review of Systems  Constitutional: Negative for appetite change, chills, fatigue and fever.  Respiratory: Negative for chest tightness and shortness of breath.   Cardiovascular: Negative for chest pain  and palpitations.  Gastrointestinal: Negative for abdominal pain, nausea and vomiting.  Neurological: Negative for dizziness and weakness.        Objective    BP 133/82   Pulse (!) 55   Temp 98 F (36.7 C)   Resp 16   Ht 5\' 3"  (1.6 m)   Wt 218 lb (98.9 kg)   BMI 38.62 kg/m  BP Readings from Last 3 Encounters:  07/22/20 133/82  06/24/20 (!) 145/50  06/01/20 132/60   Wt Readings from Last 3 Encounters:  07/22/20 218 lb (98.9 kg)  06/24/20 225 lb (102.1 kg)  06/01/20 229 lb 3.2 oz (104 kg)       Physical Exam Vitals and nursing note reviewed.  Constitutional:      Appearance: Normal appearance. She is normal weight.  HENT:     Right  Ear: Tympanic membrane normal.     Left Ear: Tympanic membrane normal.     Nose: Nose normal.     Mouth/Throat:     Mouth: Mucous membranes are moist.     Pharynx: Oropharynx is clear.  Eyes:     Conjunctiva/sclera: Conjunctivae normal.  Cardiovascular:     Rate and Rhythm: Normal rate and regular rhythm.     Pulses: Normal pulses.     Heart sounds: Normal heart sounds.  Pulmonary:     Effort: Pulmonary effort is normal.     Breath sounds: Normal breath sounds.  Abdominal:     General: Bowel sounds are normal.     Palpations: Abdomen is soft.  Musculoskeletal:     Cervical back: Normal range of motion and neck supple.     Comments: She has 1+ lower extremity edema plus lymphedema. She ambulates with great difficulty with a cane.  Skin:    General: Skin is warm and dry.     Comments: Very fair skin.  Neurological:     General: No focal deficit present.     Mental Status: She is alert and oriented to person, place, and time.  Psychiatric:        Mood and Affect: Mood normal.        Behavior: Behavior normal.        Thought Content: Thought content normal.        Judgment: Judgment normal.       No results found for any visits on 07/22/20.  Assessment & Plan     1. Type 2 diabetes mellitus without complication, without long-term current use of insulin ([name]) Sugars markedly improving on Farxiga, we will have to see if she can tolerate this.  Check wet prep. Continue glipizide and [name].  Fully discontinue glipizide in the future. 2. 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  3. Extreme obesity Weight loss is stressed.  She has lost some weight on Farxiga.  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 Pure hypercholesterolemia 10 with LDL goal less than 70  6. Essential (primary) hypertension Lisinopril HCT and metoprolol   No  follow-ups on file.      I, [name], MD, have reviewed all documentation for this visit. The documentation on 07/23/20 for the exam, diagnosis, procedures, and orders are all accurate and complete.    Alesana Magistro Cranford Mon, MD  [facility] [phone] (phone) [phone] (fax)  [place]

## 2020-07-23 DIAGNOSIS — M7918 Myalgia, other site: Secondary | ICD-10-CM | POA: Diagnosis not present

## 2020-07-23 DIAGNOSIS — M5451 Vertebrogenic low back pain: Secondary | ICD-10-CM | POA: Diagnosis not present

## 2020-07-23 DIAGNOSIS — M9905 Segmental and somatic dysfunction of pelvic region: Secondary | ICD-10-CM | POA: Diagnosis not present

## 2020-07-23 DIAGNOSIS — M5416 Radiculopathy, lumbar region: Secondary | ICD-10-CM | POA: Diagnosis not present

## 2020-07-23 DIAGNOSIS — M9903 Segmental and somatic dysfunction of lumbar region: Secondary | ICD-10-CM | POA: Diagnosis not present

## 2020-07-23 DIAGNOSIS — M5136 Other intervertebral disc degeneration, lumbar region: Secondary | ICD-10-CM | POA: Diagnosis not present

## 2020-07-30 ENCOUNTER — Telehealth: Payer: Self-pay

## 2020-07-30 DIAGNOSIS — M5136 Other intervertebral disc degeneration, lumbar region: Secondary | ICD-10-CM | POA: Diagnosis not present

## 2020-07-30 DIAGNOSIS — M9903 Segmental and somatic dysfunction of lumbar region: Secondary | ICD-10-CM | POA: Diagnosis not present

## 2020-07-30 DIAGNOSIS — M5451 Vertebrogenic low back pain: Secondary | ICD-10-CM | POA: Diagnosis not present

## 2020-07-30 DIAGNOSIS — M9905 Segmental and somatic dysfunction of pelvic region: Secondary | ICD-10-CM | POA: Diagnosis not present

## 2020-07-30 DIAGNOSIS — M7918 Myalgia, other site: Secondary | ICD-10-CM | POA: Diagnosis not present

## 2020-07-30 DIAGNOSIS — M5416 Radiculopathy, lumbar region: Secondary | ICD-10-CM | POA: Diagnosis not present

## 2020-07-30 NOTE — Progress Notes (Signed)
Left Voice message to confirmed patient telephone  appointment on 08/03/2020 for CCM at 3:30 pm with [name] the Clinical pharmacist.   [place] Pharmacist Assistant [phone]

## 2020-07-31 ENCOUNTER — Telehealth: Payer: Self-pay

## 2020-07-31 ENCOUNTER — Other Ambulatory Visit: Payer: Self-pay | Admitting: Family Medicine

## 2020-07-31 DIAGNOSIS — E119 Type 2 diabetes mellitus without complications: Secondary | ICD-10-CM

## 2020-08-03 ENCOUNTER — Ambulatory Visit (INDEPENDENT_AMBULATORY_CARE_PROVIDER_SITE_OTHER): Payer: Medicare Other

## 2020-08-03 DIAGNOSIS — E785 Hyperlipidemia, unspecified: Secondary | ICD-10-CM | POA: Diagnosis not present

## 2020-08-03 DIAGNOSIS — E039 Hypothyroidism, unspecified: Secondary | ICD-10-CM | POA: Diagnosis not present

## 2020-08-03 DIAGNOSIS — N1831 Chronic kidney disease, stage 3a: Secondary | ICD-10-CM | POA: Diagnosis not present

## 2020-08-03 DIAGNOSIS — E1159 Type 2 diabetes mellitus with other circulatory complications: Secondary | ICD-10-CM | POA: Diagnosis not present

## 2020-08-03 DIAGNOSIS — E1122 Type 2 diabetes mellitus with diabetic chronic kidney disease: Secondary | ICD-10-CM

## 2020-08-03 DIAGNOSIS — I152 Hypertension secondary to endocrine disorders: Secondary | ICD-10-CM

## 2020-08-03 DIAGNOSIS — E1169 Type 2 diabetes mellitus with other specified complication: Secondary | ICD-10-CM

## 2020-08-03 NOTE — Progress Notes (Signed)
Chronic Care Management Pharmacy Note  08/05/2020 Name:  Carol Ramsey MRN:  [id number] DOB:  [birth year]  Subjective: Carol Ramsey is an 75 y.o. year old female who is a primary patient of Jerrol Banana., MD.  The CCM team was consulted for assistance with disease management and care coordination needs.    Engaged with patient by telephone for follow up visit in response to provider referral for pharmacy case management and/or care coordination services.   Consent to Services:  The patient was given information about Chronic Care Management services, agreed to services, and gave verbal consent prior to initiation of services.  Please see initial visit note for detailed documentation.   Patient Care Team: Jerrol Banana., MD as PCP - General (Family Medicine) Marry Guan, Laurice Record, MD as Consulting Physician (Orthopedic Surgery) Dasher, Rayvon Char, MD as Consulting Physician (Dermatology) Corey Skains, MD as Consulting Physician (Cardiology) Thelma Comp, [place] as Consulting Physician (Optometry) Byrnett, Forest Gleason, MD (General Surgery) Noreene Filbert, MD as Referring Physician (Radiation Oncology) Lloyd Huger, MD as Consulting Physician (Oncology) Neldon Labella, RN as Case Manager Germaine Pomfret, [facility] (Pharmacist)  Recent office visits: 07/22/20: Patient presented to Dr. Rosanna Randy for follow-up. Sugars improved on Farxiga  06/24/20: Patient presented to Dr. Rosanna Randy for follow-up. A1c worsened to 11.1%. Patient started on Farxiga 10 mg daily. Patient provided with samples.   Recent consult visits: 04/21/20: Video visit with Dr. Grayland Ormond (Oncology) for breast cancer follow-up. Patient stable, asymptomatic. No medication changes made.  12/10/19: Patient presented to Dr. Nehemiah Massed (Cardiology) for follow-up.   Hospital visits: None in previous 6 months  Objective:  Lab Results  Component Value Date   CREATININE 1.20 (H) 02/19/2020   BUN 19 02/19/2020    GFRNONAA 45 (L) 02/19/2020   GFRAA 52 (L) 02/19/2020   NA 135 02/19/2020   K 4.5 02/19/2020   CALCIUM 8.9 02/19/2020   CO2 21 02/19/2020   GLUCOSE 170 (H) 02/19/2020    Lab Results  Component Value Date/Time   HGBA1C 11.1 (A) 06/24/2020 10:53 AM   HGBA1C 7.6 (H) 02/19/2020 10:56 AM   HGBA1C 8.3 (A) 09/26/2019 10:16 AM   HGBA1C 9.5 (H) 05/30/2019 10:13 AM    Last diabetic Eye exam:  Lab Results  Component Value Date/Time   HMDIABEYEEXA Retinopathy (A) 12/30/2019 12:00 AM    Last diabetic Foot exam: No results found for: HMDIABFOOTEX   Lab Results  Component Value Date   CHOL 121 02/19/2020   HDL 61 02/19/2020   LDLCALC 42 02/19/2020   TRIG 97 02/19/2020   CHOLHDL 2.0 02/19/2020    Hepatic Function Latest Ref Rng & Units 02/19/2020 05/30/2019 01/29/2019  Total Protein 6.0 - 8.5 g/dL 6.6 - 6.2  Albumin 3.7 - 4.7 g/dL 3.9 3.7 3.6(L)  AST 0 - 40 IU/L 15 - 15  ALT 0 - 32 IU/L 8 - -  Alk Phosphatase 44 - 121 IU/L 53 - 30(L)  Total Bilirubin 0.0 - 1.2 mg/dL 0.5 - 0.5    Lab Results  Component Value Date/Time   TSH 4.800 (H) 02/19/2020 10:56 AM   TSH 3.360 01/29/2019 11:56 AM    CBC Latest Ref Rng & Units 02/19/2020 05/30/2019 01/29/2019  WBC 3.4 - 10.8 x10E3/uL 9.2 7.7 6.5  Hemoglobin 11.1 - 15.9 g/dL 11.7 10.8(L) 10.4(L)  Hematocrit 34.0 - 46.6 % 35.5 33.8(L) 33.7(L)  Platelets 150 - 450 x10E3/uL 200 194 199    No results found for: VD25OH  Clinical ASCVD: No  The ASCVD Risk score Mikey Bussing DC Jr., et al., 2013) failed to calculate for the following reasons:   The valid total cholesterol range is 130 to 320 mg/dL    Depression screen [facility] 2/9 06/01/2020 04/02/2020 02/19/2020  Decreased Interest 0 0 0  Down, Depressed, Hopeless 0 0 0  PHQ - 2 Score 0 0 0  Altered sleeping - - 0  Tired, decreased energy - - 0  Change in appetite - - 0  Feeling bad or failure about yourself  - - 0  Trouble concentrating - - 0  Moving slowly or fidgety/restless - - 0  Suicidal thoughts  - - 0  PHQ-9 Score - - 0  Difficult doing work/chores - - Not difficult at all    Social History   Tobacco Use  Smoking Status Never Smoker  Smokeless Tobacco Never Used   BP Readings from Last 3 Encounters:  07/22/20 133/82  06/24/20 (!) 145/50  06/01/20 132/60   Pulse Readings from Last 3 Encounters:  07/22/20 (!) 55  06/24/20 60  06/01/20 78   Wt Readings from Last 3 Encounters:  07/22/20 218 lb (98.9 kg)  06/24/20 225 lb (102.1 kg)  06/01/20 229 lb 3.2 oz (104 kg)   BMI Readings from Last 3 Encounters:  07/22/20 38.62 kg/m  06/24/20 39.86 kg/m  06/01/20 40.60 kg/m    Assessment/Interventions: Review of patient past medical history, allergies, medications, health status, including review of consultants reports, laboratory and other test data, was performed as part of comprehensive evaluation and provision of chronic care management services.   SDOH:  (Social Determinants of Health) assessments and interventions performed: Yes SDOH Interventions   Flowsheet Row Most Recent Value  SDOH Interventions   Financial Strain Interventions Other (Comment)  [PAP]  Transportation Interventions Intervention Not Indicated     SDOH Screenings   Alcohol Screen: Low Risk   . Last Alcohol Screening Score (AUDIT): 0  Depression (PHQ2-9): Low Risk   . PHQ-2 Score: 0  Financial Resource Strain: High Risk  . Difficulty of Paying Living Expenses: Hard  Food Insecurity: No Food Insecurity  . Worried About Charity fundraiser in the Last Year: Never true  . Ran Out of Food in the Last Year: Never true  Housing: Low Risk   . Last Housing Risk Score: 0  Physical Activity: Inactive  . Days of Exercise per Week: 0 days  . Minutes of Exercise per Session: 0 min  Social Connections: Moderately Isolated  . Frequency of Communication with Friends and Family: Never  . Frequency of Social Gatherings with Friends and Family: More than three times a week  . Attends Religious Services:  Never  . Active Member of Clubs or Organizations: No  . Attends Archivist Meetings: Never  . Marital Status: Married  Stress: No Stress Concern Present  . Feeling of Stress : Not at all  Tobacco Use: Low Risk   . Smoking Tobacco Use: Never Smoker  . Smokeless Tobacco Use: Never Used  Transportation Needs: No Transportation Needs  . Lack of Transportation (Medical): No  . Lack of Transportation (Non-Medical): No    CCM Care Plan  Allergies  Allergen Reactions  . Gabapentin Swelling  . Quinine Nausea Only and Nausea And Vomiting  . Amoxicillin Itching and Rash    Has patient had a PCN reaction causing immediate rash, facial/tongue/throat swelling, SOB or lightheadedness with hypotension: No Has patient had a PCN reaction causing severe rash involving  mucus membranes or skin necrosis: No Has patient had a PCN reaction that required hospitalization No Has patient had a PCN reaction occurring within the last 10 years: No If all of the above answers are "NO", then may proceed with Cephalosporin use.   . Dilaudid  [Hydromorphone Hcl] Rash  . Etodolac Rash  . Hydrochlorothiazide Rash  . Hydrocodone Rash  . Hydromorphone Rash  . Sulfa Antibiotics Rash    Medications Reviewed Today    Reviewed by [name], CMA (Certified Medical Assistant) on 07/22/20 at 62  Med List Status: <None>  Medication Order Taking? Sig Documenting Provider Last Dose Status Informant  acetaminophen (TYLENOL) 500 MG tablet [id number] No Take 500 mg by mouth every 6 (six) hours as needed for mild pain or moderate pain. [provider] Taking Active Self  ascorbic acid (VITAMIN C) 500 MG tablet [id number] No Take 500 mg by mouth daily. [provider] Taking Active   atorvastatin (LIPITOR) 10 MG tablet [id number] No Take 10 mg by mouth daily at 6 PM.  [provider] Taking Active Self           Med Note Michaelle Birks, ALEXANDRE A   Tue Apr 28, 2020  1:09 PM)     Cholecalciferol (VITAMIN D) 125 MCG (5000 UT) CAPS [id number] No Take 5,000 Units by mouth daily. [provider] Taking Active Self           Med Note Michaelle Birks, Cathe Mons A   Tue Apr 28, 2020  1:10 PM)    Cyanocobalamin (VITAMIN B-12) 5000 MCG SUBL [id number] No Place 5,000 mcg under the tongue daily. [provider] Taking Active   furosemide (LASIX) 20 MG tablet [id number] No TAKE 1 TABLET BY MOUTH EVERY DAY Chrismon, Vickki Muff, PA-C Taking Active            Med Note Minerva Ends, FELECIA N   Thu Apr 02, 2020 11:08 AM) Reports taking as needed for edema  glipiZIDE (GLUCOTROL XL) 5 MG 24 hr tablet [id number] No TAKE 1 TABLET BY MOUTH DAILY Jerrol Banana., MD Taking Active   glucose blood [facility] VERIO) test strip [id number] No Use as instructed; Check blood sugar twice daily Jerrol Banana., MD Taking Active   [name] 50-1000 MG tablet [id number] No TAKE 1 TABLET BY MOUTH TWICE DAILY WITH A MEAL Jerrol Banana., MD Taking Active   lisinopril-hydrochlorothiazide (ZESTORETIC) 20-12.5 MG tablet [id number] No TAKE 2 TABLETS BY MOUTH EVERY MORNING Jerrol Banana., MD Taking Active   Magnesium 500 MG CAPS [id number] No Take 1 capsule (500 mg total) by mouth at bedtime. Milinda Pointer, MD Taking Expired 11/26/19 2359   metoprolol succinate (TOPROL-XL) 100 MG 24 hr tablet [id number] No TAKE 1 TABLET BY MOUTH DAILY WITH OR IMMEDIATELY FOLLOWING A MEAL Jerrol Banana., MD Taking Active   tamoxifen (NOLVADEX) 20 MG tablet [id number] No TAKE 1 TABLET(20 MG) BY MOUTH DAILY Grayland Ormond Kathlene November, MD Taking Active   Med List Note Hart Rochester, RN 02/01/18 1118): UDS 02-01-2018   New patient          Patient Active Problem List   Diagnosis Date Noted  . OA (osteoarthritis) of knee 10/30/2018  . Lymphedema 10/30/2018  . DDD (degenerative disc disease), lumbar 02/28/2018  . Osteoarthritis of sacroiliac joint (Left) 02/28/2018  . Somatic dysfunction of  sacroiliac joint (Left) 02/28/2018  . Sacroiliac joint dysfunction (Left) 02/28/2018  . Abnormal MRI, lumbar spine (07/22/2013) 02/28/2018  .  Lumbar facet syndrome (Bilateral) 02/28/2018  . Lumbar spondylosis 02/28/2018  . 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 Lumbar facet arthropathy (Bilateral) 02/28/2018  . Lumbar intervertebral disc protrusion 02/28/2018  . Lumbar nerve root compression 02/28/2018  . Morbid obesity with BMI of 40.0-44.9, adult ([place]) 02/27/2018  . Hypoalbuminemia 02/27/2018  . Hypomagnesemia 02/27/2018  . Vitamin B 12 deficiency 02/27/2018  . Low serum vitamin B12 02/06/2018  . Morbid obesity ([place]) 02/01/2018  . Chronic low back pain (Primary Area of Pain) (Bilateral) w/ sciatica (Left) 02/01/2018  . Chronic lower extremity pain (Secondary Area of Pain) (Left) 02/01/2018  . Chronic pain syndrome 02/01/2018  . Pharmacologic therapy 02/01/2018  . Disorder of skeletal system 02/01/2018  . Problems influencing health status 02/01/2018  . Chronic sacroiliac joint pain (Left) 02/01/2018  . Bilateral leg edema 02/21/2017  . CKD (chronic kidney disease) stage 3, GFR 30-59 ml/min ([name]) 07/21/2016  . 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 LVH (left ventricular hypertrophy) due to hypertensive disease, without heart failure 01/19/2016  . Pulmonary hypertension ([place]) 02/10/2015  . Acquired hypothyroidism 09/17/2014  . Adaptation reaction 09/17/2014  . Absolute anemia 09/17/2014  . Cardiac dysrhythmia 09/17/2014  . Lumbar facet hypertrophy 09/17/2014  . 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 Diabetes mellitus type 2, uncomplicated ([place]) 40/12/6759  . Essential (primary) hypertension 09/17/2014  . HLD (hyperlipidemia) 09/17/2014  . Extreme obesity 09/17/2014  . Vitamin D insufficiency 09/17/2014  . Beat, premature ventricular 12/11/2013  . OBESITY 11/14/2007  . Achilles bursitis or tendinitis 11/14/2007  .  Calcaneal bursitis (heel), unspecified laterality 06/26/2007    Immunization History  Administered Date(s) Administered  . Fluad Quad(high Dose 65+) 01/29/2019, 01/30/2020  . Influenza Split 02/25/2006, 03/07/2011, 02/27/2012  . Influenza, High Dose Seasonal PF 03/03/2016, 01/26/2017, 01/08/2018  . Influenza,inj,Quad PF,6+ Mos 03/04/2013  . PFIZER(Purple Top)SARS-COV-2 Vaccination 06/11/2019, 07/02/2019, 01/31/2020  . Pneumococcal Conjugate-13 04/13/2015  . Pneumococcal Polysaccharide-23 02/17/2007, 02/27/2012  . Td 10/21/2003  . Tdap 05/11/2016  . Zoster 11/08/2010    Conditions to be addressed/monitored:  Hypertension, Hyperlipidemia, Diabetes, Chronic Kidney Disease, Hypothyroidism and Chronic Pain and History of Breast Cancer  Care Plan : General Pharmacy (Adult)  Updates made by Germaine Pomfret, RPH since 08/05/2020 12:00 AM    Problem: Hypertension, Hyperlipidemia, Diabetes, Chronic Kidney Disease, Hypothyroidism and Chronic Pain and History of Breast Cancer   Priority: High    Long-Range Goal: Patient-Specific Goal   Start Date: 08/03/2020  Expected End Date: 02/02/2021  This Visit's Progress: On track  Priority: High  Note:   Current Barriers:  . Unable to independently afford treatment regimen . Unable to achieve control of diabetes   Pharmacist Clinical Goal(s):  [name] Patient will verbalize ability to afford treatment regimen . achieve control of diabetes as evidenced by A1c less than 8% through collaboration with PharmD and provider.   Interventions: . 1:1 collaboration with Jerrol Banana., MD regarding development and update of comprehensive plan of care as evidenced by provider attestation and co-signature . Inter-disciplinary care team collaboration (see longitudinal plan of care) . Comprehensive medication review performed; medication list updated in electronic medical record  Hypertension (BP goal <140/90) -Controlled -Current  treatment: . Furosemide 20 mg daily  . Lisinopril-HCTZ 20-12.5 mg 2 tablets daily  . Metoprolol XL 100 mg daily  -Medications previously tried: NA  -Current home readings: NA -Denies hypotensive/hypertensive symptoms -Educated on BP goals and benefits of medications for prevention  of heart attack, stroke and kidney damage; Symptoms of hypotension and importance of maintaining adequate hydration; -Counseled to monitor BP at home weekly, document, and provide log at future appointments -Recommended to continue current medication  Hyperlipidemia: (LDL goal < 100) -Controlled -Current treatment: . Atorvastatin 10 mg daily  -Medications previously tried: NA  -Current dietary patterns: Trying to eat more vegetables, glucerna shakes, eating less overall.  -Current exercise habits: Physical Therapy exercises 3 days  -Educated on Importance of limiting foods high in cholesterol; Exercise goal of 150 minutes per week; -Recommended to continue current medication  Diabetes (A1c goal <8%) -Uncontrolled -Current medications: . Farxiga 10 mg daily . Glipizide XL 5 mg daily   . [name] 50-1000 mg twice daily -Medications previously tried: NA  -Has had recurrent yeast infection. She took Monostat, but it did not completely eradicate her infection. -Current home glucose readings . fasting glucose: [phone] . post prandial glucose: NA -Denies hypoglycemic/hyperglycemic symptoms -Educated on Benefits of routine self-monitoring of blood sugar; Carbohydrate counting and/or plate method  -Counseled on proper hygiene to minimize risk of recurrent yeast infections  -Counseled to check feet daily and get yearly eye exams -Recommended Fluconazole for yeast infection Assessed patient finances. Will start patient assistance for [place]   Patient Goals/Self-Care Activities . Patient will:  - check glucose daily before breakfast, document, and provide at future appointments check blood pressure  weekly, document, and provide at future appointments target a minimum of 150 minutes of moderate intensity exercise weekly  Follow Up Plan: Telephone follow up appointment with care management team member scheduled for:  11/09/2020 at 2:00 PM      Medication Assistance: Application for [name], Farxiga  medication assistance program. in process.  Anticipated assistance start date 08/30/2020.  See plan of care for additional detail.  Patient's preferred pharmacy is:  [id number] [address], [place] AT [facility] [address] [place] [address] Phone: [phone] Fax: [phone]  Uses pill box? Yes Pt endorses 100% compliance  We discussed: Current pharmacy is preferred with insurance plan and patient is satisfied with pharmacy services Patient decided to: Continue current medication management strategy  Care Plan and Follow Up Patient Decision:  Patient agrees to Care Plan and Follow-up.  Plan: Telephone follow up appointment with care management team member scheduled for:  11/09/2020 at 2:00 PM  [name], PharmD, [place] [phone]

## 2020-08-04 DIAGNOSIS — M9905 Segmental and somatic dysfunction of pelvic region: Secondary | ICD-10-CM | POA: Diagnosis not present

## 2020-08-04 DIAGNOSIS — M5416 Radiculopathy, lumbar region: Secondary | ICD-10-CM | POA: Diagnosis not present

## 2020-08-04 DIAGNOSIS — M9903 Segmental and somatic dysfunction of lumbar region: Secondary | ICD-10-CM | POA: Diagnosis not present

## 2020-08-04 DIAGNOSIS — M5136 Other intervertebral disc degeneration, lumbar region: Secondary | ICD-10-CM | POA: Diagnosis not present

## 2020-08-04 DIAGNOSIS — M5451 Vertebrogenic low back pain: Secondary | ICD-10-CM | POA: Diagnosis not present

## 2020-08-04 DIAGNOSIS — M7918 Myalgia, other site: Secondary | ICD-10-CM | POA: Diagnosis not present

## 2020-08-05 NOTE — Patient Instructions (Signed)
Visit Information It was great speaking with you today!  Please let me know if you have any questions about our visit.  Goals Addressed            This Visit's Progress   . Monitor and Manage My Blood Sugar-Diabetes Type 2       Timeframe:  Long-Range Goal Priority:  High Start Date:  08/03/2020                           Expected End Date: 02/02/2021                      Follow Up Date 09/28/2020   - check blood sugar at prescribed times - check blood sugar if I feel it is too high or too low - enter blood sugar readings and medication or insulin into daily log    Why is this important?    Checking your blood sugar at home helps to keep it from getting very high or very low.   Writing the results in a diary or log helps the doctor know how to care for you.   Your blood sugar log should have the time, date and the results.   Also, write down the amount of insulin or other medicine that you take.   Other information, like what you ate, exercise done and how you were feeling, will also be helpful.     Notes:        Patient Care Plan: General Pharmacy (Adult)    Problem Identified: Hypertension, Hyperlipidemia, Diabetes, Chronic Kidney Disease, Hypothyroidism and Chronic Pain and History of Breast Cancer   Priority: High    Long-Range Goal: Patient-Specific Goal   Start Date: 08/03/2020  Expected End Date: 02/02/2021  This Visit's Progress: On track  Priority: High  Note:   Current Barriers:  . Unable to independently afford treatment regimen . Unable to achieve control of diabetes   Pharmacist Clinical Goal(s):  [name] Patient will verbalize ability to afford treatment regimen . achieve control of diabetes as evidenced by A1c less than 8% through collaboration with PharmD and provider.   Interventions: . 1:1 collaboration with Jerrol Banana., MD regarding development and update of comprehensive plan of care as evidenced by provider attestation and  co-signature . Inter-disciplinary care team collaboration (see longitudinal plan of care) . Comprehensive medication review performed; medication list updated in electronic medical record  Hypertension (BP goal <140/90) -Controlled -Current treatment: . Furosemide 20 mg daily  . Lisinopril-HCTZ 20-12.5 mg 2 tablets daily  . Metoprolol XL 100 mg daily  -Medications previously tried: NA  -Current home readings: NA -Denies hypotensive/hypertensive symptoms -Educated on BP goals and benefits of medications for prevention of heart attack, stroke and kidney damage; Symptoms of hypotension and importance of maintaining adequate hydration; -Counseled to monitor BP at home weekly, document, and provide log at future appointments -Recommended to continue current medication  Hyperlipidemia: (LDL goal < 100) -Controlled -Current treatment: . Atorvastatin 10 mg daily  -Medications previously tried: NA  -Current dietary patterns: Trying to eat more vegetables, glucerna shakes, eating less overall.  -Current exercise habits: Physical Therapy exercises 3 days  -Educated on Importance of limiting foods high in cholesterol; Exercise goal of 150 minutes per week; -Recommended to continue current medication  Diabetes (A1c goal <8%) -Uncontrolled -Current medications: . Farxiga 10 mg daily . Glipizide XL 5 mg daily   . [name] 50-1000 mg twice  daily -Medications previously tried: NA  -Has had recurrent yeast infection. She took Monostat, but it did not completely eradicate her infection. -Current home glucose readings . fasting glucose: [id number] . post prandial glucose: NA -Denies hypoglycemic/hyperglycemic symptoms -Educated on Benefits of routine self-monitoring of blood sugar; Carbohydrate counting and/or plate method  -Counseled on proper hygiene to minimize risk of recurrent yeast infections  -Counseled to check feet daily and get yearly eye exams -Recommended Fluconazole for yeast  infection Assessed patient finances. Will start patient assistance for [place]  Patient Goals/Self-Care Activities . Patient will:  - check glucose daily before breakfast, document, and provide at future appointments check blood pressure weekly, document, and provide at future appointments target a minimum of 150 minutes of moderate intensity exercise weekly  Follow Up Plan: Telephone follow up appointment with care management team member scheduled for:  11/09/2020 at 2:00 PM      Patient agreed to services and verbal consent obtained.   The patient verbalized understanding of instructions, educational materials, and care plan provided today and declined offer to receive copy of patient instructions, educational materials, and care plan.   [name], PharmD, [place] [phone]

## 2020-08-06 DIAGNOSIS — M9905 Segmental and somatic dysfunction of pelvic region: Secondary | ICD-10-CM | POA: Diagnosis not present

## 2020-08-06 DIAGNOSIS — M5451 Vertebrogenic low back pain: Secondary | ICD-10-CM | POA: Diagnosis not present

## 2020-08-06 DIAGNOSIS — Z8049 Family history of malignant neoplasm of other genital organs: Secondary | ICD-10-CM | POA: Diagnosis not present

## 2020-08-06 DIAGNOSIS — M5416 Radiculopathy, lumbar region: Secondary | ICD-10-CM | POA: Diagnosis not present

## 2020-08-06 DIAGNOSIS — Z1379 Encounter for other screening for genetic and chromosomal anomalies: Secondary | ICD-10-CM | POA: Diagnosis not present

## 2020-08-06 DIAGNOSIS — M5136 Other intervertebral disc degeneration, lumbar region: Secondary | ICD-10-CM | POA: Diagnosis not present

## 2020-08-06 DIAGNOSIS — M9903 Segmental and somatic dysfunction of lumbar region: Secondary | ICD-10-CM | POA: Diagnosis not present

## 2020-08-06 DIAGNOSIS — Z853 Personal history of malignant neoplasm of breast: Secondary | ICD-10-CM | POA: Diagnosis not present

## 2020-08-06 DIAGNOSIS — M7918 Myalgia, other site: Secondary | ICD-10-CM | POA: Diagnosis not present

## 2020-08-10 ENCOUNTER — Telehealth: Payer: Self-pay

## 2020-08-10 NOTE — Chronic Care Management (AMB) (Signed)
08/10/2020- Patient called to follow up with PharmD regarding treatment request from Dr Rosanna Randy- PCP for a yeast infection she has and feels like this was caused from the [place]. Patient aware I will follow up with [name], CPP on treatment.   08/26/2020- Called patient back to follow up on complaints, [name], CPP did contact PCP for treatment. Noted of refill on Farxiga being sent but no follow up on yeast infection. Left message for patient to return call.   08/28/2020- Reached out to patient again, I was able to speak with patient and she informed me she did not receive any medication from Dr Rosanna Randy, she states her yeast infection is not as bad, some itching still. Patient aware I will follow back up with [name], CPP and PCP office.    [name], CPP notified.  09/02/2020- Called Dr Marlan Palau office, spoke with staff, message sent to Dr Marlan Palau nurse to contact patient regarding yeast infection symptoms and questionable side effect to [place].   Pattricia Boss, [place]

## 2020-08-13 DIAGNOSIS — M1711 Unilateral primary osteoarthritis, right knee: Secondary | ICD-10-CM | POA: Diagnosis not present

## 2020-08-13 DIAGNOSIS — M5451 Vertebrogenic low back pain: Secondary | ICD-10-CM | POA: Diagnosis not present

## 2020-08-13 DIAGNOSIS — M5136 Other intervertebral disc degeneration, lumbar region: Secondary | ICD-10-CM | POA: Diagnosis not present

## 2020-08-13 DIAGNOSIS — M7918 Myalgia, other site: Secondary | ICD-10-CM | POA: Diagnosis not present

## 2020-08-13 DIAGNOSIS — M9903 Segmental and somatic dysfunction of lumbar region: Secondary | ICD-10-CM | POA: Diagnosis not present

## 2020-08-13 DIAGNOSIS — M9905 Segmental and somatic dysfunction of pelvic region: Secondary | ICD-10-CM | POA: Diagnosis not present

## 2020-08-13 DIAGNOSIS — M25761 Osteophyte, right knee: Secondary | ICD-10-CM | POA: Diagnosis not present

## 2020-08-13 DIAGNOSIS — M5416 Radiculopathy, lumbar region: Secondary | ICD-10-CM | POA: Diagnosis not present

## 2020-08-14 ENCOUNTER — Other Ambulatory Visit: Payer: Self-pay | Admitting: Family Medicine

## 2020-08-14 MED ORDER — DAPAGLIFLOZIN PROPANEDIOL 10 MG PO TABS: 10.0000 mg | ORAL_TABLET | Freq: Every day | ORAL | 0 refills | Status: DC

## 2020-08-14 NOTE — Telephone Encounter (Signed)
Notes to clinic:  medication was filled by a historical provider  Review for refill    Requested Prescriptions  Pending Prescriptions Disp Refills   dapagliflozin propanediol (FARXIGA) 10 MG TABS tablet 30 tablet 0    Sig: Take 1 tablet (10 mg total) by mouth daily.      Endocrinology:  Diabetes - SGLT2 Inhibitors Failed - 08/14/2020 11:40 AM      Failed - Cr in normal range and within 360 days    Creat  Date Value Ref Range Status  01/26/2017 1.16 (H) 0.60 - 0.93 mg/dL Final    Comment:    For patients >11 years of age, the reference limit for Creatinine is approximately 13% higher for people identified as African-American. .    Creatinine, Ser  Date Value Ref Range Status  02/19/2020 1.20 (H) 0.57 - 1.00 mg/dL Final          Failed - LDL in normal range and within 360 days    LDL Cholesterol (Calc)  Date Value Ref Range Status  01/26/2017 43 mg/dL (calc) Final    Comment:    Reference range: <100 . Desirable range <100 mg/dL for primary prevention;   <70 mg/dL for patients with CHD or diabetic patients  with > or = 2 CHD risk factors. [name] LDL-C is now calculated using the Martin-Hopkins  calculation, which is a validated novel method providing  better accuracy than the Friedewald equation in the  estimation of LDL-C.  Cresenciano Genre et al. Annamaria Helling. 9292;446(28): 2061-2068  (http://education.[facility].com/faq/FAQ164)    LDL Chol Calc (NIH)  Date Value Ref Range Status  02/19/2020 42 0 - 99 mg/dL Final          Failed - HBA1C is between 0 and 7.9 and within 180 days    Hemoglobin A1C  Date Value Ref Range Status  06/24/2020 11.1 (A) 4.0 - 5.6 % Final   Hgb A1c MFr Bld  Date Value Ref Range Status  02/19/2020 7.6 (H) 4.8 - 5.6 % Final    Comment:             Prediabetes: 5.7 - 6.4          Diabetes: >6.4          Glycemic control for adults with diabetes: <7.0           Failed - eGFR in normal range and within 360 days    EGFR (African American)   Date Value Ref Range Status  08/31/2011 57 (L)  Final   GFR calc Af Amer  Date Value Ref Range Status  02/19/2020 52 (L) >59 mL/min/1.73 Final    Comment:    **In accordance with recommendations from the NKF-ASN Task force,**   [facility] is in the process of updating its eGFR calculation to the   2021 CKD-EPI creatinine equation that estimates kidney function   without a race variable.    EGFR (Non-African Amer.)  Date Value Ref Range Status  08/31/2011 49 (L)  Final    Comment:    eGFR values <8m/min/1.73 m2 may be an indication of chronic kidney disease (CKD). Calculated eGFR is useful in patients with stable renal function. The eGFR calculation will not be reliable in acutely ill patients when serum creatinine is changing rapidly. It is not useful in  patients on dialysis. The eGFR calculation may not be applicable to patients at the low and high extremes of body sizes, pregnant women, and vegetarians.    GFR calc non  Af Amer  Date Value Ref Range Status  02/19/2020 45 (L) >59 mL/min/1.73 Final          Passed - Valid encounter within last 6 months    Recent Outpatient Visits           3 weeks ago Type 2 diabetes mellitus without complication, without long-term current use of insulin [facility])   [facility] Jerrol Banana., MD   1 month ago Type 2 diabetes mellitus without complication, without long-term current use of insulin [facility])   [facility] Jerrol Banana., MD   5 months ago Type 2 diabetes mellitus with complication, without long-term current use of insulin [facility])   [facility] Jerrol Banana., MD   10 months ago Type 2 diabetes mellitus with complication, without long-term current use of insulin [facility])   [facility] Jerrol Banana., MD   1 year ago Chronic low back pain (Primary Area of Pain) (Bilateral) w/ sciatica (Left)   [facility] Jerrol Banana., MD        Future Appointments             In 2 months Jerrol Banana., MD [facility], [facility]

## 2020-08-14 NOTE — Telephone Encounter (Signed)
Copied from [place] [id number]. Topic: Quick Communication - Rx Refill/Question >> Aug 14, 2020 11:35 AM Tessa Lerner A wrote: Medication: dapagliflozin propanediol (FARXIGA) 10 MG TABS tablet   Has the patient contacted their pharmacy? No. Patient was uncertain of the medication's refill status. Patient has been directed, by agent, to contact the pharmacy first for future refills   Preferred Pharmacy (with phone number or street name): [facility] [place], [place] - [address] AT [address]  Phone:  [phone] Fax:  [phone]  Agent: Please be advised that RX refills may take up to 3 business days. We ask that you follow-up with your pharmacy.

## 2020-08-18 ENCOUNTER — Ambulatory Visit (INDEPENDENT_AMBULATORY_CARE_PROVIDER_SITE_OTHER): Payer: Medicare Other

## 2020-08-18 DIAGNOSIS — N1831 Chronic kidney disease, stage 3a: Secondary | ICD-10-CM | POA: Diagnosis not present

## 2020-08-18 DIAGNOSIS — E1122 Type 2 diabetes mellitus with diabetic chronic kidney disease: Secondary | ICD-10-CM | POA: Diagnosis not present

## 2020-08-18 NOTE — Progress Notes (Signed)
[name] Patient Assistance form for Farxiga completed by patient and faxed for review on 08/18/20   [name], PharmD, [place] [phone]

## 2020-08-27 DIAGNOSIS — M7918 Myalgia, other site: Secondary | ICD-10-CM | POA: Diagnosis not present

## 2020-08-27 DIAGNOSIS — M9903 Segmental and somatic dysfunction of lumbar region: Secondary | ICD-10-CM | POA: Diagnosis not present

## 2020-08-27 DIAGNOSIS — M5451 Vertebrogenic low back pain: Secondary | ICD-10-CM | POA: Diagnosis not present

## 2020-08-27 DIAGNOSIS — M9905 Segmental and somatic dysfunction of pelvic region: Secondary | ICD-10-CM | POA: Diagnosis not present

## 2020-08-27 DIAGNOSIS — M1711 Unilateral primary osteoarthritis, right knee: Secondary | ICD-10-CM | POA: Diagnosis not present

## 2020-08-27 DIAGNOSIS — M5136 Other intervertebral disc degeneration, lumbar region: Secondary | ICD-10-CM | POA: Diagnosis not present

## 2020-08-27 DIAGNOSIS — M5416 Radiculopathy, lumbar region: Secondary | ICD-10-CM | POA: Diagnosis not present

## 2020-09-02 ENCOUNTER — Telehealth: Payer: Self-pay | Admitting: *Deleted

## 2020-09-02 DIAGNOSIS — D0471 Carcinoma in situ of skin of right lower limb, including hip: Secondary | ICD-10-CM | POA: Diagnosis not present

## 2020-09-02 DIAGNOSIS — D485 Neoplasm of uncertain behavior of skin: Secondary | ICD-10-CM | POA: Diagnosis not present

## 2020-09-02 DIAGNOSIS — C44712 Basal cell carcinoma of skin of right lower limb, including hip: Secondary | ICD-10-CM | POA: Diagnosis not present

## 2020-09-02 DIAGNOSIS — L570 Actinic keratosis: Secondary | ICD-10-CM | POA: Diagnosis not present

## 2020-09-02 DIAGNOSIS — X32XXXA Exposure to sunlight, initial encounter: Secondary | ICD-10-CM | POA: Diagnosis not present

## 2020-09-02 MED ORDER — FLUCONAZOLE 150 MG PO TABS: 150.0000 mg | ORAL_TABLET | Freq: Every day | ORAL | 0 refills | Status: AC

## 2020-09-02 NOTE — Telephone Encounter (Signed)
Patient advised.KW 

## 2020-09-02 NOTE — Telephone Encounter (Signed)
Please advise yeast infection?

## 2020-09-02 NOTE — Telephone Encounter (Signed)
Diflucan 150 mg daily for 2 days.

## 2020-09-02 NOTE — Telephone Encounter (Signed)
Copied from [place] [id number]. Topic: General - Other >> Sep 02, 2020 11:40 AM Yvette Rack wrote: Reason for CRM: Felicity Coyer pharmacist assistant who works with [name] stated she s[oke with patient and was advised that patient has not heard from anyone regarding yeast infection. Tamala requests that patient be contacted for prescription or an appt if needed. Cb# [phone]

## 2020-09-03 DIAGNOSIS — M1711 Unilateral primary osteoarthritis, right knee: Secondary | ICD-10-CM | POA: Diagnosis not present

## 2020-09-04 ENCOUNTER — Telehealth: Payer: Self-pay

## 2020-09-04 NOTE — Progress Notes (Signed)
Chronic Care Management Pharmacy Assistant   Name: Carol Ramsey  MRN: [id number] DOB: 1945-07-18  Reason for Encounter:Diabetes Disease State Call.    Recent office visits:  No recent Office Visit  Recent consult visits:  No recent [facility] visits:  None in previous 6 months  Medications: Outpatient Encounter Medications as of 09/04/2020  Medication Sig Note  . acetaminophen (TYLENOL) 500 MG tablet Take 500 mg by mouth every 6 (six) hours as needed for mild pain or moderate pain.   [name] ascorbic acid (VITAMIN C) 500 MG tablet Take 500 mg by mouth daily.   [name] atorvastatin (LIPITOR) 10 MG tablet Take 10 mg by mouth daily at 6 PM.    . Cholecalciferol (VITAMIN D) 125 MCG (5000 UT) CAPS Take 5,000 Units by mouth daily.   . Cyanocobalamin (VITAMIN B-12) 5000 MCG SUBL Place 5,000 mcg under the tongue daily.   . dapagliflozin propanediol (FARXIGA) 10 MG TABS tablet Take 1 tablet (10 mg total) by mouth daily.   . fluconazole (DIFLUCAN) 150 MG tablet Take 1 tablet (150 mg total) by mouth daily for 2 doses.   . furosemide (LASIX) 20 MG tablet TAKE 1 TABLET BY MOUTH EVERY DAY 04/02/2020: Reports taking as needed for edema  . glipiZIDE (GLUCOTROL XL) 5 MG 24 hr tablet TAKE 1 TABLET BY MOUTH DAILY   . glucose blood (ONETOUCH VERIO) test strip Use as instructed; Check blood sugar twice daily   . [name] 50-1000 MG tablet TAKE 1 TABLET BY MOUTH TWICE DAILY WITH A MEAL   . lisinopril-hydrochlorothiazide (ZESTORETIC) 20-12.5 MG tablet TAKE 2 TABLETS BY MOUTH EVERY MORNING   . Magnesium 500 MG CAPS Take 1 capsule (500 mg total) by mouth at bedtime.   . metoprolol succinate (TOPROL-XL) 100 MG 24 hr tablet TAKE 1 TABLET BY MOUTH DAILY WITH OR IMMEDIATELY FOLLOWING A MEAL   . tamoxifen (NOLVADEX) 20 MG tablet TAKE 1 TABLET(20 MG) BY MOUTH DAILY    No facility-administered encounter medications on file as of 09/04/2020.    Star Rating Drugs: Atorvastatin 10 mg last filled on 06/15/2020  for 90 day supply at [facility] 10 mg  Jaumet 50-1000 mg last filled on 06/15/2020 for 90 day supply at [place]: Lab Results  Component Value Date/Time   HGBA1C 11.1 (A) 06/24/2020 10:53 AM   HGBA1C 7.6 (H) 02/19/2020 10:56 AM   HGBA1C 8.3 (A) 09/26/2019 10:16 AM   HGBA1C 9.5 (H) 05/30/2019 10:13 AM    Kidney Function Lab Results  Component Value Date/Time   CREATININE 1.20 (H) 02/19/2020 10:56 AM   CREATININE 1.21 (H) 05/30/2019 10:13 AM   CREATININE 1.16 (H) 01/26/2017 12:22 PM   CREATININE 1.17 08/31/2011 10:39 AM   CREATININE 1.28 07/28/2011 01:44 AM   GFRNONAA 45 (L) 02/19/2020 10:56 AM   GFRNONAA 49 (L) 08/31/2011 10:39 AM   GFRAA 52 (L) 02/19/2020 10:56 AM   GFRAA 57 (L) 08/31/2011 10:39 AM    . Current antihyperglycemic regimen:   Farxiga 10 mg daily  Glipizide XL 5 mg daily    [name] 50-1000 mg twice daily . What recent interventions/DTPs have been made to improve glycemic control:  o Started patient assistance for [place] . Have there been any recent hospitalizations or ED visits since last visit with CPP? No . Patient denies hypoglycemic symptoms, including Pale, Sweaty, Shaky, Hungry, Nervous/irritable and Vision changes . Patient denies hyperglycemic symptoms, including blurry vision, excessive thirst, fatigue, polyuria and  weakness . How often are you checking your blood sugar? once daily . What are your blood sugars ranging?  o Fasting: N/A o Before meals: N/A o After meals:  - On 09/03/2020 it was 105. - On 09/02/2020 it was 110 o Bedtime: N/A . During the week, how often does your blood glucose drop below 70? Never . Are you checking your feet daily/regularly?   Patient denies numbness, pain or tingling in her feet.  Adherence Review: Is the patient currently on a STATIN medication? Yes Is the patient currently on ACE/ARB medication? No Does the patient have >5 day gap between last estimated fill  dates? No  Patient Assistance for [place],  Patient states she received a letter for [name] stating she did not qualify because her insurance will cover [place].  Patient states she received a box on her porch with 180 tablets for [name].Per patient I believe I was approved for [name].  Reschedule patient telephone appointment that was in July to [place] 10 2022 at 12:00 pm with clinical pharmacist for Diabetes follow up per Clinical Pharmacist.Sent message to scheduler to reschedule appointment.   [place] Pharmacist Assistant [phone]

## 2020-09-09 DIAGNOSIS — M1711 Unilateral primary osteoarthritis, right knee: Secondary | ICD-10-CM | POA: Diagnosis not present

## 2020-09-10 ENCOUNTER — Other Ambulatory Visit: Payer: Self-pay | Admitting: Family Medicine

## 2020-09-10 NOTE — Telephone Encounter (Signed)
Requested Prescriptions  Pending Prescriptions Disp Refills  . FARXIGA 10 MG TABS tablet [Pharmacy Med Name: FARXIGA 10MG TABLETS] 30 tablet 0    Sig: TAKE 1 TABLET(10 MG) BY MOUTH DAILY     Endocrinology:  Diabetes - SGLT2 Inhibitors Failed - 09/10/2020  3:11 AM      Failed - Cr in normal range and within 360 days    Creat  Date Value Ref Range Status  01/26/2017 1.16 (H) 0.60 - 0.93 mg/dL Final    Comment:    For patients >74 years of age, the reference limit for Creatinine is approximately 13% higher for people identified as African-American. .    Creatinine, Ser  Date Value Ref Range Status  02/19/2020 1.20 (H) 0.57 - 1.00 mg/dL Final         Failed - HBA1C is between 0 and 7.9 and within 180 days    Hemoglobin A1C  Date Value Ref Range Status  06/24/2020 11.1 (A) 4.0 - 5.6 % Final   Hgb A1c MFr Bld  Date Value Ref Range Status  02/19/2020 7.6 (H) 4.8 - 5.6 % Final    Comment:             Prediabetes: 5.7 - 6.4          Diabetes: >6.4          Glycemic control for adults with diabetes: <7.0          Failed - eGFR in normal range and within 360 days    EGFR (African American)  Date Value Ref Range Status  08/31/2011 57 (L)  Final   GFR calc Af Amer  Date Value Ref Range Status  02/19/2020 52 (L) >59 mL/min/1.73 Final    Comment:    **In accordance with recommendations from the NKF-ASN Task force,**   [facility] is in the process of updating its eGFR calculation to the   2021 CKD-EPI creatinine equation that estimates kidney function   without a race variable.    EGFR (Non-African Amer.)  Date Value Ref Range Status  08/31/2011 49 (L)  Final    Comment:    eGFR values <31m/min/1.73 m2 may be an indication of chronic kidney disease (CKD). Calculated eGFR is useful in patients with stable renal function. The eGFR calculation will not be reliable in acutely ill patients when serum creatinine is changing rapidly. It is not useful in  patients on dialysis. The  eGFR calculation may not be applicable to patients at the low and high extremes of body sizes, pregnant women, and vegetarians.    GFR calc non Af Amer  Date Value Ref Range Status  02/19/2020 45 (L) >59 mL/min/1.73 Final         Passed - LDL in normal range and within 360 days    LDL Cholesterol (Calc)  Date Value Ref Range Status  01/26/2017 43 mg/dL (calc) Final    Comment:    Reference range: <100 . Desirable range <100 mg/dL for primary prevention;   <70 mg/dL for patients with CHD or diabetic patients  with > or = 2 CHD risk factors. .Marland KitchenLDL-C is now calculated using the Martin-Hopkins  calculation, which is a validated novel method providing  better accuracy than the Friedewald equation in the  estimation of LDL-C.  MCresenciano Genreet al. JAnnamaria Helling 21504;136(43: 2061-2068  (http://education.[facility].com/faq/FAQ164)    LDL Chol Calc (NIH)  Date Value Ref Range Status  02/19/2020 42 0 - 99 mg/dL Final  Passed - Valid encounter within last 6 months    Recent Outpatient Visits          1 month ago Type 2 diabetes mellitus without complication, without long-term current use of insulin [facility])   [facility] Jerrol Banana., MD   2 months ago Type 2 diabetes mellitus without complication, without long-term current use of insulin [facility])   [facility] Jerrol Banana., MD   6 months ago Type 2 diabetes mellitus with complication, without long-term current use of insulin [facility])   [facility] Jerrol Banana., MD   11 months ago Type 2 diabetes mellitus with complication, without long-term current use of insulin [facility])   [facility] Jerrol Banana., MD   1 year ago Chronic low back pain (Primary Area of Pain) (Bilateral) w/ sciatica (Left)   [facility] Jerrol Banana., MD      Future Appointments            In 1 month Jerrol Banana., MD [facility], [facility]

## 2020-09-17 DIAGNOSIS — M1711 Unilateral primary osteoarthritis, right knee: Secondary | ICD-10-CM | POA: Diagnosis not present

## 2020-09-21 ENCOUNTER — Encounter: Payer: Self-pay | Admitting: *Deleted

## 2020-09-23 DIAGNOSIS — M545 Low back pain, unspecified: Secondary | ICD-10-CM | POA: Diagnosis not present

## 2020-09-23 DIAGNOSIS — M4606 Spinal enthesopathy, lumbar region: Secondary | ICD-10-CM | POA: Diagnosis not present

## 2020-09-23 DIAGNOSIS — M62830 Muscle spasm of back: Secondary | ICD-10-CM | POA: Diagnosis not present

## 2020-09-24 DIAGNOSIS — M5136 Other intervertebral disc degeneration, lumbar region: Secondary | ICD-10-CM | POA: Diagnosis not present

## 2020-09-24 DIAGNOSIS — M9903 Segmental and somatic dysfunction of lumbar region: Secondary | ICD-10-CM | POA: Diagnosis not present

## 2020-09-24 DIAGNOSIS — M7918 Myalgia, other site: Secondary | ICD-10-CM | POA: Diagnosis not present

## 2020-09-24 DIAGNOSIS — M5451 Vertebrogenic low back pain: Secondary | ICD-10-CM | POA: Diagnosis not present

## 2020-09-24 DIAGNOSIS — M9905 Segmental and somatic dysfunction of pelvic region: Secondary | ICD-10-CM | POA: Diagnosis not present

## 2020-09-24 DIAGNOSIS — M5416 Radiculopathy, lumbar region: Secondary | ICD-10-CM | POA: Diagnosis not present

## 2020-10-08 ENCOUNTER — Telehealth: Payer: Self-pay

## 2020-10-08 NOTE — Progress Notes (Signed)
Left a voice message to confirmed patient telephone appointment on 10/09/2020 for CCM at 12:00 pm with [name] the Clinical pharmacist.   Star Rating Drug: Lisinopril-HCTZ 20-12.5 mg last filled on 01/27/2020 for 90 day supply at [facility]. Glipizide 5 mg last filled on 07/08/2019 for 90 day supply at [facility]. Atorvastatin 10 mg last filled on 09/12/2020 for 90 day supply at [facility].  Any gaps in medications fill history? Lisinopril-HCTZ 20-12.5 mg last filled on 01/27/2020 for 90 day supply at [facility]. Glipizide 5 mg last filled on 07/08/2019 for 90 day supply at [facility].  [place] Pharmacist Assistant [phone]

## 2020-10-09 ENCOUNTER — Other Ambulatory Visit: Payer: Self-pay | Admitting: Family Medicine

## 2020-10-09 ENCOUNTER — Ambulatory Visit (INDEPENDENT_AMBULATORY_CARE_PROVIDER_SITE_OTHER): Payer: Medicare Other

## 2020-10-09 DIAGNOSIS — E1122 Type 2 diabetes mellitus with diabetic chronic kidney disease: Secondary | ICD-10-CM

## 2020-10-09 DIAGNOSIS — E1169 Type 2 diabetes mellitus with other specified complication: Secondary | ICD-10-CM

## 2020-10-09 DIAGNOSIS — E785 Hyperlipidemia, unspecified: Secondary | ICD-10-CM | POA: Diagnosis not present

## 2020-10-09 DIAGNOSIS — E1159 Type 2 diabetes mellitus with other circulatory complications: Secondary | ICD-10-CM

## 2020-10-09 DIAGNOSIS — I152 Hypertension secondary to endocrine disorders: Secondary | ICD-10-CM | POA: Diagnosis not present

## 2020-10-09 DIAGNOSIS — N1831 Chronic kidney disease, stage 3a: Secondary | ICD-10-CM | POA: Diagnosis not present

## 2020-10-09 NOTE — Progress Notes (Signed)
Chronic Care Management Pharmacy Note  11/09/2020 Name:  Carol Ramsey MRN:  [id number] DOB:  1945-06-19  Summary: Patient reports she is in good health today.  Recommendations/Changes made from today's visit: Continue current medications  Plan: CPP follow-up in 4 months   Subjective: Carol Ramsey is an 75 y.o. year old female who is a primary patient of Jerrol Banana., MD.  The CCM team was consulted for assistance with disease management and care coordination needs.    Engaged with patient by telephone for follow up visit in response to provider referral for pharmacy case management and/or care coordination services.   Consent to Services:  The patient was given information about Chronic Care Management services, agreed to services, and gave verbal consent prior to initiation of services.  Please see initial visit note for detailed documentation.   Patient Care Team: Jerrol Banana., MD as PCP - General (Family Medicine) Marry Guan, Laurice Record, MD as Consulting Physician (Orthopedic Surgery) Dasher, Rayvon Char, MD as Consulting Physician (Dermatology) Corey Skains, MD as Consulting Physician (Cardiology) Thelma Comp, [place] as Consulting Physician (Optometry) Byrnett, Forest Gleason, MD (General Surgery) Noreene Filbert, MD as Referring Physician (Radiation Oncology) Lloyd Huger, MD as Consulting Physician (Oncology) Neldon Labella, RN as Case Manager Germaine Pomfret, [facility] (Pharmacist)  Recent office visits: 07/22/20: Patient presented to Dr. Rosanna Randy for follow-up. Sugars improved on Farxiga  06/24/20: Patient presented to Dr. Rosanna Randy for follow-up. A1c worsened to 11.1%. Patient started on Farxiga 10 mg daily. Patient provided with samples.   Recent consult visits: 04/21/20: Video visit with Dr. Grayland Ormond (Oncology) for breast cancer follow-up. Patient stable, asymptomatic. No medication changes made.  12/10/19: Patient presented to Dr. Nehemiah Massed  (Cardiology) for follow-up.   Hospital visits: None in previous 6 months  Objective:  Lab Results  Component Value Date   CREATININE 1.20 (H) 02/19/2020   BUN 19 02/19/2020   GFRNONAA 45 (L) 02/19/2020   GFRAA 52 (L) 02/19/2020   NA 135 02/19/2020   K 4.5 02/19/2020   CALCIUM 8.9 02/19/2020   CO2 21 02/19/2020   GLUCOSE 170 (H) 02/19/2020    Lab Results  Component Value Date/Time   HGBA1C 9.0 (A) 11/05/2020 11:20 AM   HGBA1C 11.1 (A) 06/24/2020 10:53 AM   HGBA1C 7.6 (H) 02/19/2020 10:56 AM   HGBA1C 9.5 (H) 05/30/2019 10:13 AM    Last diabetic Eye exam:  Lab Results  Component Value Date/Time   HMDIABEYEEXA Retinopathy (A) 12/30/2019 12:00 AM    Last diabetic Foot exam: No results found for: HMDIABFOOTEX   Lab Results  Component Value Date   CHOL 121 02/19/2020   HDL 61 02/19/2020   LDLCALC 42 02/19/2020   TRIG 97 02/19/2020   CHOLHDL 2.0 02/19/2020    Hepatic Function Latest Ref Rng & Units 02/19/2020 05/30/2019 01/29/2019  Total Protein 6.0 - 8.5 g/dL 6.6 - 6.2  Albumin 3.7 - 4.7 g/dL 3.9 3.7 3.6(L)  AST 0 - 40 IU/L 15 - 15  ALT 0 - 32 IU/L 8 - -  Alk Phosphatase 44 - 121 IU/L 53 - 30(L)  Total Bilirubin 0.0 - 1.2 mg/dL 0.5 - 0.5    Lab Results  Component Value Date/Time   TSH 4.800 (H) 02/19/2020 10:56 AM   TSH 3.360 01/29/2019 11:56 AM    CBC Latest Ref Rng & Units 02/19/2020 05/30/2019 01/29/2019  WBC 3.4 - 10.8 x10E3/uL 9.2 7.7 6.5  Hemoglobin 11.1 - 15.9 g/dL 11.7 10.8(L) 10.4(L)  Hematocrit 34.0 - 46.6 % 35.5 33.8(L) 33.7(L)  Platelets 150 - 450 x10E3/uL 200 194 199    No results found for: VD25OH  Clinical ASCVD: No  The ASCVD Risk score Mikey Bussing DC Jr., et al., 2013) failed to calculate for the following reasons:   The valid total cholesterol range is 130 to 320 mg/dL    Depression screen [facility] 2/9 06/01/2020 04/02/2020 02/19/2020  Decreased Interest 0 0 0  Down, Depressed, Hopeless 0 0 0  PHQ - 2 Score 0 0 0  Altered sleeping - - 0  Tired,  decreased energy - - 0  Change in appetite - - 0  Feeling bad or failure about yourself  - - 0  Trouble concentrating - - 0  Moving slowly or fidgety/restless - - 0  Suicidal thoughts - - 0  PHQ-9 Score - - 0  Difficult doing work/chores - - Not difficult at all  Some recent data might be hidden    Social History   Tobacco Use  Smoking Status Never  Smokeless Tobacco Never   BP Readings from Last 3 Encounters:  11/05/20 (!) 118/54  10/23/20 (!) 141/57  07/22/20 133/82   Pulse Readings from Last 3 Encounters:  11/05/20 (!) 52  10/23/20 (!) 52  07/22/20 (!) 55   Wt Readings from Last 3 Encounters:  11/05/20 207 lb (93.9 kg)  10/23/20 208 lb (94.3 kg)  07/22/20 218 lb (98.9 kg)   BMI Readings from Last 3 Encounters:  11/05/20 36.67 kg/m  10/23/20 36.85 kg/m  07/22/20 38.62 kg/m    Assessment/Interventions: Review of patient past medical history, allergies, medications, health status, including review of consultants reports, laboratory and other test data, was performed as part of comprehensive evaluation and provision of chronic care management services.   SDOH:  (Social Determinants of Health) assessments and interventions performed: Yes   SDOH Screenings   Alcohol Screen: Low Risk    Last Alcohol Screening Score (AUDIT): 0  Depression (PHQ2-9): Low Risk    PHQ-2 Score: 0  Financial Resource Strain: High Risk   Difficulty of Paying Living Expenses: Hard  Food Insecurity: No Food Insecurity   Worried About Charity fundraiser in the Last Year: Never true   Ran Out of Food in the Last Year: Never true  Housing: Low Risk    Last Housing Risk Score: 0  Physical Activity: Inactive   Days of Exercise per Week: 0 days   Minutes of Exercise per Session: 0 min  Social Connections: Moderately Isolated   Frequency of Communication with Friends and Family: Never   Frequency of Social Gatherings with Friends and Family: More than three times a week   Attends Religious  Services: Never   Marine scientist or Organizations: No   Attends Music therapist: Never   Marital Status: Married  Stress: No Stress Concern Present   Feeling of Stress : Not at all  Tobacco Use: Low Risk    Smoking Tobacco Use: Never   Smokeless Tobacco Use: Never  Transportation Needs: No Transportation Needs   Lack of Transportation (Medical): No   Lack of Transportation (Non-Medical): No    CCM Care Plan  Allergies  Allergen Reactions   Gabapentin Swelling   Quinine Nausea Only and Nausea And Vomiting   Amoxicillin Itching and Rash    Has patient had a PCN reaction causing immediate rash, facial/tongue/throat swelling, SOB or lightheadedness with hypotension: No Has patient had a PCN reaction causing severe rash  involving mucus membranes or skin necrosis: No Has patient had a PCN reaction that required hospitalization No Has patient had a PCN reaction occurring within the last 10 years: No If all of the above answers are "NO", then may proceed with Cephalosporin use.    Dilaudid  [Hydromorphone Hcl] Rash   Etodolac Rash   Hydrochlorothiazide Rash   Hydrocodone Rash   Hydromorphone Rash   Sulfa Antibiotics Rash    Medications Reviewed Today     Reviewed by Velna Hatchet, CMA (Certified Medical Assistant) on 10/23/20 at 1027  Med List Status: <None>   Medication Order Taking? Sig Documenting Provider Last Dose Status Informant  acetaminophen (TYLENOL) 500 MG tablet [id number] Yes Take 500 mg by mouth every 6 (six) hours as needed for mild pain or moderate pain. [provider] Taking Active Self  ascorbic acid (VITAMIN C) 500 MG tablet [id number] Yes Take 500 mg by mouth daily. [provider] Taking Active   atorvastatin (LIPITOR) 10 MG tablet [id number] Yes Take 10 mg by mouth daily at 6 PM.  [provider] Taking Active Self           Med Note Michaelle Birks, Berk Pilot A   Tue Apr 28, 2020  1:09 PM)    Cholecalciferol  (VITAMIN D) 125 MCG (5000 UT) CAPS [id number]  Take 5,000 Units by mouth daily. [provider]  Active Self           Med Note Michaelle Birks, Cathe Mons A   Tue Apr 28, 2020  1:10 PM)    Cyanocobalamin (VITAMIN B-12) 5000 MCG SUBL [id number] Yes Place 5,000 mcg under the tongue daily. [provider] Taking Active   FARXIGA 10 MG TABS tablet [id number] Yes TAKE 1 TABLET(10 MG) BY MOUTH DAILY Jerrol Banana., MD Taking Active   furosemide (LASIX) 20 MG tablet [id number] Yes TAKE 1 TABLET BY MOUTH EVERY DAY Chrismon, Vickki Muff, PA-C Taking Active            Med Note Minerva Ends, FELECIA N   Thu Apr 02, 2020 11:08 AM) Reports taking as needed for edema  glipiZIDE (GLUCOTROL XL) 5 MG 24 hr tablet [id number] Yes TAKE 1 TABLET BY MOUTH DAILY Jerrol Banana., MD Taking Active   glucose blood [facility] VERIO) test strip [id number] Yes Use as instructed; Check blood sugar twice daily Jerrol Banana., MD Taking Active   [name] 50-1000 MG tablet [id number] Yes TAKE 1 TABLET BY MOUTH TWICE DAILY WITH A MEAL Jerrol Banana., MD Taking Active   lisinopril-hydrochlorothiazide (ZESTORETIC) 20-12.5 MG tablet [id number] Yes TAKE 2 TABLETS BY MOUTH EVERY MORNING Jerrol Banana., MD Taking Active   Magnesium 500 MG CAPS [id number]  Take 1 capsule (500 mg total) by mouth at bedtime. Milinda Pointer, MD  Expired 11/26/19 2359   metoprolol succinate (TOPROL-XL) 100 MG 24 hr tablet [id number] Yes TAKE 1 TABLET BY MOUTH DAILY WITH OR IMMEDIATELY FOLLOWING A MEAL Jerrol Banana., MD Taking Active   tamoxifen (NOLVADEX) 20 MG tablet [id number] Yes TAKE 1 TABLET(20 MG) BY MOUTH DAILY Grayland Ormond Kathlene November, MD Taking Active   Med List Note Hart Rochester, RN 02/01/18 1118): UDS 02-01-2018   New patient            Patient Active Problem List   Diagnosis Date Noted   OA (osteoarthritis) of knee 10/30/2018   Lymphedema 10/30/2018   DDD (degenerative disc disease), lumbar  02/28/2018   Osteoarthritis of sacroiliac joint (  Left) 02/28/2018   Somatic dysfunction of sacroiliac joint (Left) 02/28/2018   Sacroiliac joint dysfunction (Left) 02/28/2018   Abnormal MRI, lumbar spine (07/22/2013) 02/28/2018   Lumbar facet syndrome (Bilateral) 02/28/2018   Lumbar spondylosis 02/28/2018   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) 02/28/2018   Lumbar facet arthropathy (Bilateral) 02/28/2018   Lumbar intervertebral disc protrusion 02/28/2018   Lumbar nerve root compression 02/28/2018   Morbid obesity with BMI of 40.0-44.9, adult ([place]) 02/27/2018   Hypoalbuminemia 02/27/2018   Hypomagnesemia 02/27/2018   Vitamin B 12 deficiency 02/27/2018   Low serum vitamin B12 02/06/2018   Morbid obesity ([place]) 02/01/2018   Chronic low back pain (Primary Area of Pain) (Bilateral) w/ sciatica (Left) 02/01/2018   Chronic lower extremity pain (Secondary Area of Pain) (Left) 02/01/2018   Chronic pain syndrome 02/01/2018   Pharmacologic therapy 02/01/2018   Disorder of skeletal system 02/01/2018   Problems influencing health status 02/01/2018   Chronic sacroiliac joint pain (Left) 02/01/2018   Bilateral leg edema 02/21/2017   CKD (chronic kidney disease) stage 3, GFR 30-59 ml/min ([place]) 07/21/2016   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   LVH (left ventricular hypertrophy) due to hypertensive disease, without heart failure 01/19/2016   Pulmonary hypertension ([place]) 02/10/2015   Acquired hypothyroidism 09/17/2014   Adaptation reaction 09/17/2014   Absolute anemia 09/17/2014   Cardiac dysrhythmia 09/17/2014   Lumbar facet hypertrophy 09/17/2014   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 09/17/2014   Diabetes mellitus type 2, uncomplicated ([place]) 65/79/0383   Essential (primary) hypertension 09/17/2014   HLD (hyperlipidemia) 09/17/2014   Extreme obesity 09/17/2014   Vitamin D insufficiency 09/17/2014   Beat, premature ventricular 12/11/2013   OBESITY 11/14/2007    Achilles bursitis or tendinitis 11/14/2007   Calcaneal bursitis (heel), unspecified laterality 06/26/2007    Immunization History  Administered Date(s) Administered   Fluad Quad(high Dose 65+) 01/29/2019, 01/30/2020   Influenza Split 02/25/2006, 03/07/2011, 02/27/2012   Influenza, High Dose Seasonal PF 03/03/2016, 01/26/2017, 01/08/2018   Influenza,inj,Quad PF,6+ Mos 03/04/2013   PFIZER(Purple Top)SARS-COV-2 Vaccination 06/11/2019, 07/02/2019, 01/31/2020   Pneumococcal Conjugate-13 04/13/2015   Pneumococcal Polysaccharide-23 02/17/2007, 02/27/2012   Td 10/21/2003   Tdap 05/11/2016   Zoster, Live 11/08/2010    Conditions to be addressed/monitored:  Hypertension, Hyperlipidemia, Diabetes, Chronic Kidney Disease, Hypothyroidism and Chronic Pain and History of Breast Cancer  Care Plan : General Pharmacy (Adult)  Updates made by Germaine Pomfret, RPH since 11/09/2020 12:00 AM     Problem: Hypertension, Hyperlipidemia, Diabetes, Chronic Kidney Disease, Hypothyroidism and Chronic Pain and History of Breast Cancer   Priority: High     Long-Range Goal: Patient-Specific Goal   Start Date: 08/03/2020  Expected End Date: 05/12/2021  This Visit's Progress: On track  Recent Progress: On track  Priority: High  Note:   Current Barriers:  Unable to independently afford treatment regimen Unable to achieve control of diabetes   Pharmacist Clinical Goal(s):  Patient will verbalize ability to afford treatment regimen achieve control of diabetes as evidenced by A1c less than 8% through collaboration with PharmD and provider.   Interventions: 1:1 collaboration with Jerrol Banana., MD regarding development and update of comprehensive plan of care as evidenced by provider attestation and co-signature Inter-disciplinary care team collaboration (see longitudinal plan of care) Comprehensive medication review performed; medication list updated in electronic medical record  Hypertension (BP  goal <140/90) -Controlled -Current treatment: Furosemide 20 mg daily  Lisinopril-HCTZ 20-12.5 mg 2 tablets daily  Metoprolol XL  100 mg daily  -Medications previously tried: NA  -Current home readings: NA -Denies hypotensive/hypertensive symptoms Symptoms of hypotension and importance of maintaining adequate hydration; -Counseled to monitor BP at home weekly, document, and provide log at future appointments -Recommended to continue current medication  Hyperlipidemia: (LDL goal < 100) -Controlled -Current treatment: Atorvastatin 10 mg daily  -Medications previously tried: NA  -Current dietary patterns: Trying to eat more vegetables, glucerna shakes, eating less overall.  -Current exercise habits: Physical Therapy exercises 3 days  -Educated on Importance of limiting foods high in cholesterol; Exercise goal of 150 minutes per week; -Recommended to continue current medication  Diabetes (A1c goal <8%) -Controlled -Current medications: Farxiga 10 mg daily Glipizide XL 5 mg daily   [name] 50-1000 mg twice daily -Medications previously tried: NA  -Current home glucose readings fasting glucose: 113,110,93, 114, 119, 105, 110, 115, 122  post prandial glucose: NA -Denies hypoglycemic/hyperglycemic symptoms -Educated on Benefits of routine self-monitoring of blood sugar; -Continue current medications  Patient Goals/Self-Care Activities Patient will:  - check glucose daily before breakfast, document, and provide at future appointments check blood pressure weekly, document, and provide at future appointments target a minimum of 150 minutes of moderate intensity exercise weekly  Follow Up Plan: Telephone follow up appointment with care management team member scheduled for:  04/02/2021 at 10:00 AM       Medication Assistance: Application for [name], Farxiga  medication assistance program. in process.  Anticipated assistance start date 08/30/2020.  See plan of care for additional  detail.  Patient's preferred pharmacy is:  [id number] [address], [place] AT [facility] OF [address] [place] [address] Phone: [phone] Fax: [phone]  Uses pill box? Yes Pt endorses 80% compliance  We discussed: Current pharmacy is preferred with insurance plan and patient is satisfied with pharmacy services Patient decided to: Continue current medication management strategy  Care Plan and Follow Up Patient Decision:  Patient agrees to Care Plan and Follow-up.  Plan: Telephone follow up appointment with care management team member scheduled for:  04/02/2021 at 10:00 AM  [name], PharmD, [place] [phone]

## 2020-10-12 ENCOUNTER — Ambulatory Visit: Payer: Self-pay

## 2020-10-12 DIAGNOSIS — E1159 Type 2 diabetes mellitus with other circulatory complications: Secondary | ICD-10-CM

## 2020-10-12 DIAGNOSIS — E1122 Type 2 diabetes mellitus with diabetic chronic kidney disease: Secondary | ICD-10-CM | POA: Diagnosis not present

## 2020-10-12 DIAGNOSIS — I152 Hypertension secondary to endocrine disorders: Secondary | ICD-10-CM

## 2020-10-12 DIAGNOSIS — E1169 Type 2 diabetes mellitus with other specified complication: Secondary | ICD-10-CM | POA: Diagnosis not present

## 2020-10-12 DIAGNOSIS — E785 Hyperlipidemia, unspecified: Secondary | ICD-10-CM | POA: Diagnosis not present

## 2020-10-12 DIAGNOSIS — N1831 Chronic kidney disease, stage 3a: Secondary | ICD-10-CM

## 2020-10-12 NOTE — Chronic Care Management (AMB) (Signed)
Chronic Care Management   Follow Up Note   10/12/2020 Name: Carol Ramsey MRN: [id number] DOB: 1946/02/21  Primary Care Provider: Jerrol Banana., MD Reason for referral : Chronic Care Management   Carol Ramsey is a 75 y.o. year old female who is a primary care patient of Jerrol Banana., MD. She is currently enrolled in the Chronic Care Management team. A routine telephonic outreach was conducted today.  Review of Carol Ramsey's status, including review of consultants reports, relevant labs and test results was conducted today. Collaboration with appropriate care team members was performed as part of the comprehensive evaluation and provision of chronic care management services.     SDOH (Social Determinants of Health) assessments performed: No     Outpatient Encounter Medications as of 10/12/2020  Medication Sig Note   acetaminophen (TYLENOL) 500 MG tablet Take 500 mg by mouth every 6 (six) hours as needed for mild pain or moderate pain.    ascorbic acid (VITAMIN C) 500 MG tablet Take 500 mg by mouth daily.    atorvastatin (LIPITOR) 10 MG tablet Take 10 mg by mouth daily at 6 PM.     Cholecalciferol (VITAMIN D) 125 MCG (5000 UT) CAPS Take 5,000 Units by mouth daily.    Cyanocobalamin (VITAMIN B-12) 5000 MCG SUBL Place 5,000 mcg under the tongue daily.    FARXIGA 10 MG TABS tablet TAKE 1 TABLET(10 MG) BY MOUTH DAILY    furosemide (LASIX) 20 MG tablet TAKE 1 TABLET BY MOUTH EVERY DAY 04/02/2020: Reports taking as needed for edema   glipiZIDE (GLUCOTROL XL) 5 MG 24 hr tablet TAKE 1 TABLET BY MOUTH DAILY    glucose blood (ONETOUCH VERIO) test strip Use as instructed; Check blood sugar twice daily    [name] 50-1000 MG tablet TAKE 1 TABLET BY MOUTH TWICE DAILY WITH A MEAL    lisinopril-hydrochlorothiazide (ZESTORETIC) 20-12.5 MG tablet TAKE 2 TABLETS BY MOUTH EVERY MORNING    Magnesium 500 MG CAPS Take 1 capsule (500 mg total) by mouth at bedtime.    metoprolol succinate  (TOPROL-XL) 100 MG 24 hr tablet TAKE 1 TABLET BY MOUTH DAILY WITH OR IMMEDIATELY FOLLOWING A MEAL    tamoxifen (NOLVADEX) 20 MG tablet TAKE 1 TABLET(20 MG) BY MOUTH DAILY    No facility-administered encounter medications on file as of 10/12/2020.     Objective:  Patient Care Plan: Diabetes Type 2 (Adult)   Problem Identified: Disease Progression (Diabetes, Type 2)    Long-Range Goal: Disease Progression Prevented or Minimized   Start Date: 07/13/2020  Expected End Date: 11/10/2020  Priority: High  Note:   Objective:  Lab Results  Component Value Date   HGBA1C 11.1 (A) 06/24/2020   Lab Results  Component Value Date   CREATININE 1.20 (H) 02/19/2020   CREATININE 1.21 (H) 05/30/2019   CREATININE 1.41 (H) 01/29/2019   No results found for: EGFR    Current Barriers:  Chronic Disease Management support and educational needs r/t Diabetes self-management.   Case Manager Clinical Goal(s):  Over the next 120 days, patient will demonstrate improved adherence to prescribed treatment plan for Diabetes self-management as evidenced by taking medications as prescribed, daily monitoring and recording of CBG and adherence to ADA/ carb modified diet  Interventions:  Collaboration with Jerrol Banana., MD regarding development and update of comprehensive plan of care as evidenced by provider attestation and co-signature Inter-disciplinary care team collaboration (see longitudinal plan of care) Reviewed medications. Reports taking medications as  prescribed. She was able to obtain assistance with [name] and reports taking consistently since the end of April. Will keep the CCM Pharmacist updated of medication needs. Reviewed blood glucose readings. Reports fasting readings continue to improve. Reports reading today was 99 mg/dl. Reports most readings have ranged in the low 100's. Reviewed s/sx of hypoglycemia and hyperglycemia along with recommended interventions. Encouraged to continue  monitoring and maintaining a log.  Discussed compliance with nutrition and preventive care exams. Reports compliance with diet. DM preventive care exams are up to date. She remains motivated to improve and optimize glycemic control. She anticipates a significant improvement with her next A1C. Declines need for additional resources.    Patient Goals/Self-Care Activities - Self-administer medications as prescribed - Attend all scheduled provider appointments - Monitor blood glucose levels consistently and utilize recommended interventions - Adhere to prescribed ADA/carb modified - Notify provider or care management team with questions and new concerns as needed   Follow Up Plan:  Will follow up next month        PLAN A member of the care management team will follow up next month.    [name] Care Management [facility] [phone]

## 2020-10-19 ENCOUNTER — Ambulatory Visit: Payer: Medicare Other | Admitting: Nurse Practitioner

## 2020-10-22 DIAGNOSIS — M9903 Segmental and somatic dysfunction of lumbar region: Secondary | ICD-10-CM | POA: Diagnosis not present

## 2020-10-22 DIAGNOSIS — M9905 Segmental and somatic dysfunction of pelvic region: Secondary | ICD-10-CM | POA: Diagnosis not present

## 2020-10-22 DIAGNOSIS — M25552 Pain in left hip: Secondary | ICD-10-CM | POA: Diagnosis not present

## 2020-10-22 DIAGNOSIS — M7918 Myalgia, other site: Secondary | ICD-10-CM | POA: Diagnosis not present

## 2020-10-22 DIAGNOSIS — M5136 Other intervertebral disc degeneration, lumbar region: Secondary | ICD-10-CM | POA: Diagnosis not present

## 2020-10-22 DIAGNOSIS — M5416 Radiculopathy, lumbar region: Secondary | ICD-10-CM | POA: Diagnosis not present

## 2020-10-22 DIAGNOSIS — M5451 Vertebrogenic low back pain: Secondary | ICD-10-CM | POA: Diagnosis not present

## 2020-10-23 ENCOUNTER — Other Ambulatory Visit: Payer: Self-pay

## 2020-10-23 ENCOUNTER — Inpatient Hospital Stay: Payer: Medicare Other | Attending: Oncology | Admitting: Oncology

## 2020-10-23 VITALS — BP 141/57 | HR 52 | Temp 98.0°F | Resp 18 | Wt 208.0 lb

## 2020-10-23 DIAGNOSIS — D0511 Intraductal carcinoma in situ of right breast: Secondary | ICD-10-CM | POA: Diagnosis not present

## 2020-10-23 NOTE — Progress Notes (Signed)
Carol Ramsey  [phone] [phone]) [phone]  ID: Carol Ramsey OB: 1945/12/21  MR#: [id number]  AOZ#:[id number]  Patient Care Team: Jerrol Banana., MD as PCP - General (Family Medicine) Marry Guan, Laurice Record, MD as Consulting Physician (Orthopedic Surgery) Dasher, Rayvon Char, MD as Consulting Physician (Dermatology) Corey Skains, MD as Consulting Physician (Cardiology) Thelma Comp, [place] as Consulting Physician (Optometry) Byrnett, Forest Gleason, MD (General Surgery) Noreene Filbert, MD as Referring Physician (Radiation Oncology) Lloyd Huger, MD as Consulting Physician (Oncology) Neldon Labella, RN as Case Manager Germaine Pomfret, [facility] (Pharmacist)  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  Carol Ramsey was last seen in clinic on 04/21/2021.   In the interim, Carol Ramsey has been dealing with some low back pain.  Carol Ramsey has had multiple rounds of physical therapy with no improvement.  More recently Carol Ramsey has been seen by chiropractor and has received some back injections.  States the injections have been helping relieve some of the "tightness" but pain is still persistent.  Pain is worse with standing.  Appetite is stable.  Weight is down 10 pounds since March 2022.  Carol Ramsey continues to tolerate tamoxifen well.  REVIEW OF SYSTEMS:   Review of Systems  Constitutional: Negative.  Negative for fever, malaise/fatigue and weight loss.  Respiratory:  Positive for shortness of breath. Negative for cough.   Cardiovascular: Negative.  Negative for chest pain and leg swelling.  Gastrointestinal: Negative.  Negative for abdominal pain.  Genitourinary: Negative.  Negative for dysuria.  Musculoskeletal:  Positive for back pain.  Skin: Negative.  Negative for rash.  Neurological:  Positive for weakness. Negative for sensory change and focal weakness.  Endo/Heme/Allergies:  Negative for environmental  allergies.  Psychiatric/Behavioral: Negative.  The patient is not nervous/anxious.    As per HPI. Otherwise, a complete review of systems is negative.  PAST MEDICAL HISTORY: Past Medical History:  Diagnosis Date   Anemia    Arthritis    [place]   right breast DCIS grade 3   [place]    basal [place]   Diabetes mellitus without complication ([name])    Hyperlipidemia    Hypertension    Hyperthyroidism    Obesity    Personal history of radiation therapy    Right lumpectomy 2018   Thyroid disease    hyperthyroidism   Vitamin D deficiency     PAST SURGICAL HISTORY: Past Surgical History:  Procedure Laterality Date   BREAST BIOPSY Right 06/28/2016   stereotactic biopsy - DUCTAL CARCINOMA IN SITU (DCIS), HIGH NUCLEAR GRADE  (very narrow margins)   BREAST LUMPECTOMY Right 07/18/2016   DCIS   JOINT REPLACEMENT Left    tkr   KNEE SURGERY Left    arthroscopy   MASTECTOMY, PARTIAL Right 07/18/2016   Procedure: MASTECTOMY PARTIAL;  Surgeon: JRobert Bellow MD;  Location: [facility] ORS;  Service: General;  Laterality: Right;   SKIN CANCER EXCISION     face    FAMILY HISTORY: Family History  Problem Relation Age of Onset   Hypertension Mother    Cervical cancer Mother    Heart disease Father    Hypertension Father    Lung cancer Father    Lung cancer Sister    Brain cancer Sister    Diabetes Brother    Heart disease Brother    Hypertension Brother    Stroke Brother    Breast cancer Neg  Hx     ADVANCED DIRECTIVES (Y/N):  N  HEALTH MAINTENANCE: Social History   Tobacco Use   Smoking status: Never   Smokeless tobacco: Never  Vaping Use   Vaping Use: Never used  Substance Use Topics   Alcohol use: No   Drug use: No     Colonoscopy:  PAP:  Bone density:  Lipid panel:  Allergies  Allergen Reactions   Gabapentin Swelling   Quinine Nausea Only and Nausea And Vomiting   Amoxicillin Itching and Rash    Has patient had a PCN  reaction causing immediate rash, facial/tongue/throat swelling, SOB or lightheadedness with hypotension: No Has patient had a PCN reaction causing severe rash involving mucus membranes or skin necrosis: No Has patient had a PCN reaction that required hospitalization No Has patient had a PCN reaction occurring within the last 10 years: No If all of the above answers are "NO", then may proceed with Cephalosporin use.    Dilaudid  [Hydromorphone Hcl] Rash   Etodolac Rash   Hydrochlorothiazide Rash   Hydrocodone Rash   Hydromorphone Rash   Sulfa Antibiotics Rash    Current Outpatient Medications  Medication Sig Dispense Refill   acetaminophen (TYLENOL) 500 MG tablet Take 500 mg by mouth every 6 (six) hours as needed for mild pain or moderate pain.     ascorbic acid (VITAMIN C) 500 MG tablet Take 500 mg by mouth daily.     atorvastatin (LIPITOR) 10 MG tablet Take 10 mg by mouth daily at 6 PM.      Cyanocobalamin (VITAMIN B-12) 5000 MCG SUBL Place 5,000 mcg under the tongue daily.     FARXIGA 10 MG TABS tablet TAKE 1 TABLET(10 MG) BY MOUTH DAILY 30 tablet 0   furosemide (LASIX) 20 MG tablet TAKE 1 TABLET BY MOUTH EVERY DAY 30 tablet 3   glipiZIDE (GLUCOTROL XL) 5 MG 24 hr tablet TAKE 1 TABLET BY MOUTH DAILY 90 tablet 3   glucose blood (ONETOUCH VERIO) test strip Use as instructed; Check blood sugar twice daily 100 each 12   [name] 50-1000 MG tablet TAKE 1 TABLET BY MOUTH TWICE DAILY WITH A MEAL 180 tablet 0   lisinopril-hydrochlorothiazide (ZESTORETIC) 20-12.5 MG tablet TAKE 2 TABLETS BY MOUTH EVERY MORNING 180 tablet 1   metoprolol succinate (TOPROL-XL) 100 MG 24 hr tablet TAKE 1 TABLET BY MOUTH DAILY WITH OR IMMEDIATELY FOLLOWING A MEAL 90 tablet 1   tamoxifen (NOLVADEX) 20 MG tablet TAKE 1 TABLET(20 MG) BY MOUTH DAILY 90 tablet 3   Cholecalciferol (VITAMIN D) 125 MCG (5000 UT) CAPS Take 5,000 Units by mouth daily.     Magnesium 500 MG CAPS Take 1 capsule (500 mg total) by mouth at bedtime.  30 capsule 5   No current facility-administered medications for this visit.    OBJECTIVE: Vitals:   10/23/20 1027  BP: (!) 141/57  Pulse: (!) 52  Resp: 18  Temp: 98 F (36.7 C)  SpO2: 99%     Body mass index is 36.85 kg/m.    ECOG FS:0 - Asymptomatic  Physical Exam Constitutional:      Appearance: Normal appearance.  HENT:     Head: Normocephalic and atraumatic.  Eyes:     Pupils: Pupils are equal, round, and reactive to light.  Cardiovascular:     Rate and Rhythm: Normal rate and regular rhythm.     Heart sounds: Normal heart sounds. No murmur heard. Pulmonary:     Effort: Pulmonary effort is normal.  Breath sounds: Normal breath sounds. No wheezing.  Abdominal:     General: Bowel sounds are normal. There is no distension.     Palpations: Abdomen is soft.     Tenderness: There is no abdominal tenderness.  Musculoskeletal:        General: Normal range of motion.     Cervical back: Normal range of motion.  Skin:    General: Skin is warm and dry.     Findings: No rash.  Neurological:     Mental Status: Carol Ramsey is alert and oriented to person, place, and time.  Psychiatric:        Judgment: Judgment normal.    LAB RESULTS:  Lab Results  Component Value Date   NA 135 02/19/2020   K 4.5 02/19/2020   CL 100 02/19/2020   CO2 21 02/19/2020   GLUCOSE 170 (H) 02/19/2020   BUN 19 02/19/2020   CREATININE 1.20 (H) 02/19/2020   CALCIUM 8.9 02/19/2020   PROT 6.6 02/19/2020   ALBUMIN 3.9 02/19/2020   AST 15 02/19/2020   ALT 8 02/19/2020   ALKPHOS 53 02/19/2020   BILITOT 0.5 02/19/2020   GFRNONAA 45 (L) 02/19/2020   GFRAA 52 (L) 02/19/2020    Lab Results  Component Value Date   WBC 9.2 02/19/2020   NEUTROABS 6.1 02/19/2020   HGB 11.7 02/19/2020   HCT 35.5 02/19/2020   MCV 89 02/19/2020   PLT 200 02/19/2020     STUDIES: No results found.  ASSESSMENT: Right breast high grade DCIS.  PLAN:    1. Right breast high grade DCIS:  Final pathology was  reviewed independently as also discussed at breast case conference.  Patient had close margins less than 0.5 mm and was given the option for reexcision to obtain better margins or proceed directly with XRT.  Carol Ramsey declined reexcision and subsequently underwent adjuvant XRT which Carol Ramsey completed in May 2018.  Continue tamoxifen for total of 5 years completing treatment in May 2023.   Her most recent mammogram on June 26, 2020 was reported as BI-RADS 1.  Repeat in February 2022 Return to clinic in 6 months for routine evaluation.  2. Back Pain: Chronic. Currently receiving back injections through her chiropractor  Disposition: Continue Tamoxifen RTC in 6 months.  Repeat Mammogram in Feb 2023.   Greater than 50% was spent in counseling and coordination of care with this patient including but not limited to discussion of the relevant topics above (See A&P) including, but not limited to diagnosis and management of acute and chronic medical conditions.   Patient expressed understanding and was in agreement with this plan. Carol Ramsey also understands that Carol Ramsey can call clinic at any time with any questions, concerns, or complaints.   Cancer Staging Ductal carcinoma in situ (DCIS) of right breast Staging form: Breast, AJCC 8th Edition - Pathologic stage from 08/02/2016: Stage 0 (pTis (DCIS), pN0, cM0, ER: Positive, PR: Positive, HER2: Negative) - Signed by Lloyd Huger, MD on 08/02/2016 Neoadjuvant therapy: No Nuclear grade: G3 Laterality: Right   Jacquelin Hawking, NP   10/23/2020 10:36 AM

## 2020-10-26 NOTE — Patient Instructions (Signed)
Thank you for allowing the Chronic Care Management team to participate in your care.  It was a pleasure speaking with you. Please feel free to contact me with questions.  Goals Addressed: Patient Care Plan: Diabetes Type 2 (Adult)   Problem Identified: Disease Progression (Diabetes, Type 2)    Long-Range Goal: Disease Progression Prevented or Minimized   Start Date: 07/13/2020  Expected End Date: 11/10/2020  Priority: High  Note:   Objective:  Lab Results  Component Value Date   HGBA1C 11.1 (A) 06/24/2020   Lab Results  Component Value Date   CREATININE 1.20 (H) 02/19/2020   CREATININE 1.21 (H) 05/30/2019   CREATININE 1.41 (H) 01/29/2019   No results found for: EGFR    Current Barriers:  Chronic Disease Management support and educational needs r/t Diabetes self-management.   Case Manager Clinical Goal(s):  Over the next 120 days, patient will demonstrate improved adherence to prescribed treatment plan for Diabetes self-management as evidenced by taking medications as prescribed, daily monitoring and recording of CBG and adherence to ADA/ carb modified diet  Interventions:  Collaboration with Jerrol Banana., MD regarding development and update of comprehensive plan of care as evidenced by provider attestation and co-signature Inter-disciplinary care team collaboration (see longitudinal plan of care) Reviewed medications. Reports taking medications as prescribed. She was able to obtain assistance with [name] and reports taking consistently since the end of April. Will keep the CCM Pharmacist updated of medication needs. Reviewed blood glucose readings. Reports fasting readings continue to improve. Reports reading today was 99 mg/dl. Reports most readings have ranged in the low 100's. Reviewed s/sx of hypoglycemia and hyperglycemia along with recommended interventions. Encouraged to continue monitoring and maintaining a log.  Discussed compliance with nutrition and  preventive care exams. Reports compliance with diet. DM preventive care exams are up to date. She remains motivated to improve and optimize glycemic control. She anticipates a significant improvement with her next A1C. Declines need for additional resources.    Patient Goals/Self-Care Activities - Self-administer medications as prescribed - Attend all scheduled provider appointments - Monitor blood glucose levels consistently and utilize recommended interventions - Adhere to prescribed ADA/carb modified - Notify provider or care management team with questions and new concerns as needed   Follow Up Plan:  Will follow up next month        Carol Ramsey verbalized understanding of the information discussed during the telephonic outreach. Declined need for mailed/printed instructions. A member of the care management team will follow up next month.    Carol Ramsey [facility] Care Management [facility] [phone]

## 2020-10-27 ENCOUNTER — Ambulatory Visit: Payer: Self-pay | Admitting: Family Medicine

## 2020-10-30 ENCOUNTER — Telehealth: Payer: Self-pay

## 2020-10-30 NOTE — Progress Notes (Signed)
    Chronic Care Management Pharmacy Assistant   Name: Carol Ramsey  MRN: [id number] DOB: 1945/10/16  Reason for Encounter: Medication Review/Patient assistance for [name]   Recent office visits:  10/12/2020 Neldon Labella RN (CCM)  Recent consult visits:  10/23/2020 [facility] visits:  None in previous 6 months  Medications: Outpatient Encounter Medications as of 10/30/2020  Medication Sig Note   acetaminophen (TYLENOL) 500 MG tablet Take 500 mg by mouth every 6 (six) hours as needed for mild pain or moderate pain.    ascorbic acid (VITAMIN C) 500 MG tablet Take 500 mg by mouth daily.    atorvastatin (LIPITOR) 10 MG tablet Take 10 mg by mouth daily at 6 PM.     Cholecalciferol (VITAMIN D) 125 MCG (5000 UT) CAPS Take 5,000 Units by mouth daily.    Cyanocobalamin (VITAMIN B-12) 5000 MCG SUBL Place 5,000 mcg under the tongue daily.    FARXIGA 10 MG TABS tablet TAKE 1 TABLET(10 MG) BY MOUTH DAILY    furosemide (LASIX) 20 MG tablet TAKE 1 TABLET BY MOUTH EVERY DAY 04/02/2020: Reports taking as needed for edema   glipiZIDE (GLUCOTROL XL) 5 MG 24 hr tablet TAKE 1 TABLET BY MOUTH DAILY    glucose blood (ONETOUCH VERIO) test strip Use as instructed; Check blood sugar twice daily    [name] 50-1000 MG tablet TAKE 1 TABLET BY MOUTH TWICE DAILY WITH A MEAL    lisinopril-hydrochlorothiazide (ZESTORETIC) 20-12.5 MG tablet TAKE 2 TABLETS BY MOUTH EVERY MORNING    Magnesium 500 MG CAPS Take 1 capsule (500 mg total) by mouth at bedtime.    metoprolol succinate (TOPROL-XL) 100 MG 24 hr tablet TAKE 1 TABLET BY MOUTH DAILY WITH OR IMMEDIATELY FOLLOWING A MEAL    tamoxifen (NOLVADEX) 20 MG tablet TAKE 1 TABLET(20 MG) BY MOUTH DAILY    No facility-administered encounter medications on file as of 10/30/2020.    Care Gaps: N/A Star Rating Drugs: Lisinopril-HCTZ 20-12.5 mg last filled on 01/27/2020 for 90 day supply at [facility]. Glipizide 5 mg last filled on 07/08/2019 for 90  day supply at [facility]. Atorvastatin 10 mg last filled on 09/12/2020 for 90 day supply at [facility].  Patient states she has not received her order for [name] from [employer].Patient reports her son call [name] to ask why, and patient son states they have to call and request refill to have it deliver.Patient son states he requested a refill of [name] but unsure how long it will take to receive it.Patient states she has been out of [place] since Wednesday, 10/28/2020.Informed Clinical Pharmacist if we have any samples of [name] at her PCP office that we can give her until she receives her order. Per clinical pharmacist, a month's supply of [name] will be at the front desk for her to pick up today. Patient Verbalized Understanding.  [place] Pharmacist Assistant [phone]

## 2020-11-02 DIAGNOSIS — Z20822 Contact with and (suspected) exposure to covid-19: Secondary | ICD-10-CM | POA: Diagnosis not present

## 2020-11-03 ENCOUNTER — Ambulatory Visit: Payer: Medicare Other

## 2020-11-03 DIAGNOSIS — N1831 Chronic kidney disease, stage 3a: Secondary | ICD-10-CM

## 2020-11-05 ENCOUNTER — Encounter: Payer: Self-pay | Admitting: Family Medicine

## 2020-11-05 ENCOUNTER — Other Ambulatory Visit: Payer: Self-pay

## 2020-11-05 ENCOUNTER — Ambulatory Visit (INDEPENDENT_AMBULATORY_CARE_PROVIDER_SITE_OTHER): Payer: Medicare Other | Admitting: Family Medicine

## 2020-11-05 VITALS — BP 118/54 | HR 52 | Temp 98.1°F | Resp 16 | Wt 207.0 lb

## 2020-11-05 DIAGNOSIS — I10 Essential (primary) hypertension: Secondary | ICD-10-CM

## 2020-11-05 DIAGNOSIS — M5442 Lumbago with sciatica, left side: Secondary | ICD-10-CM | POA: Diagnosis not present

## 2020-11-05 DIAGNOSIS — E039 Hypothyroidism, unspecified: Secondary | ICD-10-CM

## 2020-11-05 DIAGNOSIS — D0511 Intraductal carcinoma in situ of right breast: Secondary | ICD-10-CM

## 2020-11-05 DIAGNOSIS — N1831 Chronic kidney disease, stage 3a: Secondary | ICD-10-CM

## 2020-11-05 DIAGNOSIS — G8929 Other chronic pain: Secondary | ICD-10-CM | POA: Diagnosis not present

## 2020-11-05 DIAGNOSIS — E1122 Type 2 diabetes mellitus with diabetic chronic kidney disease: Secondary | ICD-10-CM

## 2020-11-05 LAB — POCT GLYCOSYLATED HEMOGLOBIN (HGB A1C): Hemoglobin A1C: 9 % — AB (ref 4.0–5.6)

## 2020-11-05 NOTE — Progress Notes (Signed)
Established patient visit   Patient: Carol Ramsey   DOB: 01/01/1946   75 y.o. Female  MRN: [id number] Visit Date: 11/05/2020  Today's healthcare provider: [name], MD   Chief Complaint  Patient presents with   Diabetes   Subjective    HPI  Patient comes in today for follow-up.  She is taking her medication but her blood sugars are not well controlled.  They are improving compared to earlier in the year. Diabetes Mellitus Type II, Follow-up  Lab Results  Component Value Date   HGBA1C 9.0 (A) 11/05/2020   HGBA1C 11.1 (A) 06/24/2020   HGBA1C 7.6 (H) 02/19/2020   Wt Readings from Last 3 Encounters:  11/05/20 207 lb (93.9 kg)  10/23/20 208 lb (94.3 kg)  07/22/20 218 lb (98.9 kg)   Last seen for diabetes 4 months ago.  Management since then includes no medication changes. Continue Farxiga.  She reports good compliance with treatment. She is not having side effects.  Symptoms: Yes fatigue No foot ulcerations  No appetite changes No nausea  No paresthesia of the feet  No polydipsia  No polyuria No visual disturbances   No vomiting     Home blood sugar records: fasting range: 120s  Episodes of hypoglycemia? No    Current insulin regiment: none Most Recent Eye Exam: up to date Current exercise: no regular exercise Current diet habits: well balanced  Pertinent Labs: Lab Results  Component Value Date   CHOL 121 02/19/2020   HDL 61 02/19/2020   LDLCALC 42 02/19/2020   TRIG 97 02/19/2020   CHOLHDL 2.0 02/19/2020   Lab Results  Component Value Date   NA 135 02/19/2020   K 4.5 02/19/2020   CREATININE 1.20 (H) 02/19/2020   GFRNONAA 45 (L) 02/19/2020   GFRAA 52 (L) 02/19/2020   GLUCOSE 170 (H) 02/19/2020          Medications: Outpatient Medications Prior to Visit  Medication Sig   acetaminophen (TYLENOL) 500 MG tablet Take 500 mg by mouth every 6 (six) hours as needed for mild pain or moderate pain.   ascorbic acid (VITAMIN C) 500 MG tablet  Take 500 mg by mouth daily.   atorvastatin (LIPITOR) 10 MG tablet Take 10 mg by mouth daily at 6 PM.    Cholecalciferol (VITAMIN D) 125 MCG (5000 UT) CAPS Take 5,000 Units by mouth daily.   Cyanocobalamin (VITAMIN B-12) 5000 MCG SUBL Place 5,000 mcg under the tongue daily.   FARXIGA 10 MG TABS tablet TAKE 1 TABLET(10 MG) BY MOUTH DAILY   furosemide (LASIX) 20 MG tablet TAKE 1 TABLET BY MOUTH EVERY DAY   glipiZIDE (GLUCOTROL XL) 5 MG 24 hr tablet TAKE 1 TABLET BY MOUTH DAILY   glucose blood (ONETOUCH VERIO) test strip Use as instructed; Check blood sugar twice daily   [name] 50-1000 MG tablet TAKE 1 TABLET BY MOUTH TWICE DAILY WITH A MEAL   lisinopril-hydrochlorothiazide (ZESTORETIC) 20-12.5 MG tablet TAKE 2 TABLETS BY MOUTH EVERY MORNING   Magnesium 500 MG CAPS Take 1 capsule (500 mg total) by mouth at bedtime.   metoprolol succinate (TOPROL-XL) 100 MG 24 hr tablet TAKE 1 TABLET BY MOUTH DAILY WITH OR IMMEDIATELY FOLLOWING A MEAL   tamoxifen (NOLVADEX) 20 MG tablet TAKE 1 TABLET(20 MG) BY MOUTH DAILY   No facility-administered medications prior to visit.    Review of Systems      Objective    BP (!) 118/54   Pulse (!) 52  Temp 98.1 F (36.7 C)   Resp 16   Wt 207 lb (93.9 kg)   BMI 36.67 kg/m  BP Readings from Last 3 Encounters:  11/05/20 (!) 118/54  10/23/20 (!) 141/57  07/22/20 133/82   Wt Readings from Last 3 Encounters:  11/05/20 207 lb (93.9 kg)  10/23/20 208 lb (94.3 kg)  07/22/20 218 lb (98.9 kg)       Physical Exam Vitals and nursing note reviewed.  Constitutional:      Appearance: She is obese.  HENT:     Right Ear: Tympanic membrane normal.     Left Ear: Tympanic membrane normal.     Nose: Nose normal.     Mouth/Throat:     Mouth: Mucous membranes are moist.     Pharynx: Oropharynx is clear.  Eyes:     Conjunctiva/sclera: Conjunctivae normal.  Cardiovascular:     Rate and Rhythm: Normal rate and regular rhythm.     Pulses: Normal pulses.     Heart  sounds: Normal heart sounds.  Pulmonary:     Effort: Pulmonary effort is normal.     Breath sounds: Normal breath sounds.  Abdominal:     General: Bowel sounds are normal.     Palpations: Abdomen is soft.  Musculoskeletal:     Cervical back: Normal range of motion and neck supple.     Comments: She has 1+ lower extremity edema plus lymphedema. She ambulates with great difficulty with a cane.  Skin:    General: Skin is warm and dry.     Comments: Very fair skin.  Neurological:     General: No focal deficit present.     Mental Status: She is alert and oriented to person, place, and time.  Psychiatric:        Mood and Affect: Mood normal.        Behavior: Behavior normal.        Thought Content: Thought content normal.        Judgment: Judgment normal.      Results for orders placed or performed in visit on 11/05/20  POCT glycosylated hemoglobin (Hb A1C)  Result Value Ref Range   Hemoglobin A1C 9.0 (A) 4.0 - 5.6 %   HbA1c POC (<> result, manual entry)     HbA1c, POC (prediabetic range)     HbA1c, POC (controlled diabetic range)      Assessment & Plan     1. Type 2 diabetes mellitus with stage 3a chronic kidney disease, without long-term current use of insulin ([name]) A1c is improved from 11.1-9.0.  Continue to work on diet and exercise.  Goal A1c less than 7.5.  Continue Farxiga and glipizide consider increasing glipizide dose to 10 mg On atorvastatin - POCT glycosylated hemoglobin (Hb A1C)  2. Essential (primary) hypertension Well-controlled on lisinopril HCT and Toprol  3. Acquired hypothyroidism   4. 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  Patient is little more comfortable today.  She is followed by physiatry  5. Ductal carcinoma in situ (DCIS) of right breast On tamoxifen  6. Morbid obesity ([place]) With diabetes arthritis chronic pain hypertension   No follow-ups on file.      I, [name], MD,  have reviewed all documentation for this visit. The documentation on 11/11/20 for the exam, diagnosis, procedures, and orders are all accurate and complete.    [name], MD  [facility] [phone] (phone) [phone] (fax)  [facility]  

## 2020-11-08 ENCOUNTER — Other Ambulatory Visit: Payer: Self-pay | Admitting: Family Medicine

## 2020-11-08 NOTE — Telephone Encounter (Signed)
Requested medication (s) are due for refill today: yes  Requested medication (s) are on the active medication list: yes  Last refill:  10/09/20 #30   Future visit scheduled: yes in October  Notes to clinic:  please review if ok with refill to last until 10/22   Requested Prescriptions  Pending Prescriptions Disp Refills   FARXIGA 10 MG TABS tablet [Pharmacy Med Name: FARXIGA 10MG TABLETS] 30 tablet 0    Sig: TAKE 1 TABLET(10 MG) BY MOUTH DAILY      Endocrinology:  Diabetes - SGLT2 Inhibitors Failed - 11/08/2020  3:12 AM      Failed - Cr in normal range and within 360 days    Creat  Date Value Ref Range Status  01/26/2017 1.16 (H) 0.60 - 0.93 mg/dL Final    Comment:    For patients >73 years of age, the reference limit for Creatinine is approximately 13% higher for people identified as African-American. .    Creatinine, Ser  Date Value Ref Range Status  02/19/2020 1.20 (H) 0.57 - 1.00 mg/dL Final          Failed - HBA1C is between 0 and 7.9 and within 180 days    Hemoglobin A1C  Date Value Ref Range Status  11/05/2020 9.0 (A) 4.0 - 5.6 % Final   Hgb A1c MFr Bld  Date Value Ref Range Status  02/19/2020 7.6 (H) 4.8 - 5.6 % Final    Comment:             Prediabetes: 5.7 - 6.4          Diabetes: >6.4          Glycemic control for adults with diabetes: <7.0           Failed - eGFR in normal range and within 360 days    EGFR (African American)  Date Value Ref Range Status  08/31/2011 57 (L)  Final   GFR calc Af Amer  Date Value Ref Range Status  02/19/2020 52 (L) >59 mL/min/1.73 Final    Comment:    **In accordance with recommendations from the NKF-ASN Task force,**   [facility] is in the process of updating its eGFR calculation to the   2021 CKD-EPI creatinine equation that estimates kidney function   without a race variable.    EGFR (Non-African Amer.)  Date Value Ref Range Status  08/31/2011 49 (L)  Final    Comment:    eGFR values <83m/min/1.73 m2 may be  an indication of chronic kidney disease (CKD). Calculated eGFR is useful in patients with stable renal function. The eGFR calculation will not be reliable in acutely ill patients when serum creatinine is changing rapidly. It is not useful in  patients on dialysis. The eGFR calculation may not be applicable to patients at the low and high extremes of body sizes, pregnant women, and vegetarians.    GFR calc non Af Amer  Date Value Ref Range Status  02/19/2020 45 (L) >59 mL/min/1.73 Final          Passed - LDL in normal range and within 360 days    LDL Cholesterol (Calc)  Date Value Ref Range Status  01/26/2017 43 mg/dL (calc) Final    Comment:    Reference range: <100 . Desirable range <100 mg/dL for primary prevention;   <70 mg/dL for patients with CHD or diabetic patients  with > or = 2 CHD risk factors. .Marland KitchenLDL-C is now calculated using the Martin-Hopkins  calculation, which  is a validated novel method providing  better accuracy than the Friedewald equation in the  estimation of LDL-C.  Cresenciano Genre et al. Annamaria Helling. 9774;142(39): 2061-2068  (http://education.[employer].com/faq/FAQ164)    LDL Chol Calc (NIH)  Date Value Ref Range Status  02/19/2020 42 0 - 99 mg/dL Final          Passed - Valid encounter within last 6 months    Recent Outpatient Visits           3 days ago Type 2 diabetes mellitus with stage 3a chronic kidney disease, without long-term current use of insulin [facility])   [facility] Jerrol Banana., MD   3 months ago Type 2 diabetes mellitus without complication, without long-term current use of insulin [facility])   [facility] Jerrol Banana., MD   4 months ago Type 2 diabetes mellitus without complication, without long-term current use of insulin [facility])   [facility] Jerrol Banana., MD   8 months ago Type 2 diabetes mellitus with complication, without long-term current use of insulin [facility])    [facility] Jerrol Banana., MD   1 year ago Type 2 diabetes mellitus with complication, without long-term current use of insulin [facility])   [facility] Jerrol Banana., MD       Future Appointments             In 2 months Jerrol Banana., MD [facility]

## 2020-11-09 ENCOUNTER — Telehealth: Payer: Self-pay

## 2020-11-09 NOTE — Chronic Care Management (AMB) (Signed)
  Chronic Care Management       Name: Carol Ramsey MRN: [id number] DOB: 19-Oct-1945  Primary Care Provider: Jerrol Banana., MD Reason for referral : Chronic Care Management   Carol Ramsey was scheduled for telephonic outreach today to update care plans. She was unable to attend her clinic appointment as scheduled on 10/27/20. Appointment with her primary care provider has been rescheduled for 11/05/20. Care management follow up has been rescheduled for 11/10/20.    PLAN:  Will follow up on 11/10/20.   [name] Care Management [facility] [phone]

## 2020-11-09 NOTE — Patient Instructions (Signed)
Visit Information It was great speaking with you today!  Please let me know if you have any questions about our visit.   Goals Addressed             This Visit's Progress    Monitor and Manage My Blood Sugar-Diabetes Type 2       Timeframe:  Long-Range Goal Priority:  High Start Date:  08/03/2020                           Expected End Date: 02/02/2022                      Follow Up Date 01/29/2021   - check blood sugar at prescribed times - check blood sugar if I feel it is too high or too low - enter blood sugar readings and medication or insulin into daily log    Why is this important?   Checking your blood sugar at home helps to keep it from getting very high or very low.  Writing the results in a diary or log helps the doctor know how to care for you.  Your blood sugar log should have the time, date and the results.  Also, write down the amount of insulin or other medicine that you take.  Other information, like what you ate, exercise done and how you were feeling, will also be helpful.     Notes:          Patient Care Plan: Diabetes Type 2 (Adult)     Problem Identified: Disease Progression (Diabetes, Type 2)      Long-Range Goal: Disease Progression Prevented or Minimized   Start Date: 07/13/2020  Expected End Date: 11/10/2020  Priority: High  Note:   Objective:  Lab Results  Component Value Date   HGBA1C 11.1 (A) 06/24/2020   Lab Results  Component Value Date   CREATININE 1.20 (H) 02/19/2020   CREATININE 1.21 (H) 05/30/2019   CREATININE 1.41 (H) 01/29/2019   No results found for: EGFR    Current Barriers:  Chronic Disease Management support and educational needs r/t Diabetes self-management.   Case Manager Clinical Goal(s):  Over the next 120 days, patient will demonstrate improved adherence to prescribed treatment plan for Diabetes self-management as evidenced by taking medications as prescribed, daily monitoring and recording of CBG and adherence  to ADA/ carb modified diet  Interventions:  Collaboration with Jerrol Banana., MD regarding development and update of comprehensive plan of care as evidenced by provider attestation and co-signature Inter-disciplinary care team collaboration (see longitudinal plan of care) Reviewed medications. Reports taking medications as prescribed. She was able to obtain assistance with [name] and reports taking consistently since the end of April. Will keep the CCM Pharmacist updated of medication needs. Reviewed blood glucose readings. Reports fasting readings continue to improve. Reports reading today was 99 mg/dl. Reports most readings have ranged in the low 100's. Reviewed s/sx of hypoglycemia and hyperglycemia along with recommended interventions. Encouraged to continue monitoring and maintaining a log.  Discussed compliance with nutrition and preventive care exams. Reports compliance with diet. DM preventive care exams are up to date. She remains motivated to improve and optimize glycemic control. She anticipates a significant improvement with her next A1C. Declines need for additional resources.    Patient Goals/Self-Care Activities - Self-administer medications as prescribed - Attend all scheduled provider appointments - Monitor blood glucose levels consistently and utilize recommended interventions -  Adhere to prescribed ADA/carb modified - Notify provider or care management team with questions and new concerns as needed   Follow Up Plan:  Will follow up next month       Patient Care Plan: Hypertension and Hyperlipidemia     Problem Identified: Hypertension and Hyperlipidemia      Long-Range Goal: Hypertension and Hyperlipidemia Monitored   Start Date: 07/13/2020  Expected End Date: 11/10/2020  Priority: High  Note:   Objective:  Last practice recorded BP readings:  BP Readings from Last 3 Encounters:  06/24/20 (!) 145/50  06/01/20 132/60  02/19/20 (!) 150/68   Most  recent eGFR/CrCl: No results found for: EGFR  No components found for: CRCL  Lab Results  Component Value Date   CHOL 121 02/19/2020   HDL 61 02/19/2020   LDLCALC 42 02/19/2020   TRIG 97 02/19/2020   CHOLHDL 2.0 02/19/2020     Current Barriers:  Chronic Disease Management support and educational needs r/t Hypertension and Hyperlipidemia.  Case Manager Clinical Goal(s):  Over the next 120 days, patient will demonstrate improved adherence to prescribed treatment plan as evidenced by taking all medications as prescribed, monitoring, and recording blood pressure and adhering to a cardiac prudent/heart healthy diet.  Interventions:  Collaboration with Jerrol Banana., MD regarding development and update of comprehensive plan of care as evidenced by provider attestation and co-signature Inter-disciplinary care team collaboration (see longitudinal plan of care) Reviewed medications. Encouraged to continue taking as prescribed and notify provider if unable to tolerate prescribed regimen.  Discussed BP readings. Reports not monitoring at home. Provided information regarding established blood pressure parameters along with indications for notifying a provider. Encouraged to monitor and record readings. Discussed compliance with recommended diet. Reports doing well with her diet. Declined need for educational resources.    Patient Goals/Self-Care Activities: -Self-administer medications as prescribed -Attend all scheduled provider appointments -Monitor and record blood pressure -Adhere to recommended cardiac prudent/heart healthy diet -Notify provider or care management team with questions and new concerns as needed   Follow Up Plan:  Will follow up within the next month    Patient Care Plan: General Pharmacy (Adult)     Problem Identified: Hypertension, Hyperlipidemia, Diabetes, Chronic Kidney Disease, Hypothyroidism and Chronic Pain and History of Breast Cancer   Priority: High      Long-Range Goal: Patient-Specific Goal   Start Date: 08/03/2020  Expected End Date: 05/12/2021  This Visit's Progress: On track  Recent Progress: On track  Priority: High  Note:   Current Barriers:  Unable to independently afford treatment regimen Unable to achieve control of diabetes   Pharmacist Clinical Goal(s):  Patient will verbalize ability to afford treatment regimen achieve control of diabetes as evidenced by A1c less than 8% through collaboration with PharmD and provider.   Interventions: 1:1 collaboration with Jerrol Banana., MD regarding development and update of comprehensive plan of care as evidenced by provider attestation and co-signature Inter-disciplinary care team collaboration (see longitudinal plan of care) Comprehensive medication review performed; medication list updated in electronic medical record  Hypertension (BP goal <140/90) -Controlled -Current treatment: Furosemide 20 mg daily  Lisinopril-HCTZ 20-12.5 mg 2 tablets daily  Metoprolol XL 100 mg daily  -Medications previously tried: NA  -Current home readings: NA -Denies hypotensive/hypertensive symptoms Symptoms of hypotension and importance of maintaining adequate hydration; -Counseled to monitor BP at home weekly, document, and provide log at future appointments -Recommended to continue current medication  Hyperlipidemia: (LDL goal < 100) -Controlled -Current  treatment: Atorvastatin 10 mg daily  -Medications previously tried: NA  -Current dietary patterns: Trying to eat more vegetables, glucerna shakes, eating less overall.  -Current exercise habits: Physical Therapy exercises 3 days  -Educated on Importance of limiting foods high in cholesterol; Exercise goal of 150 minutes per week; -Recommended to continue current medication  Diabetes (A1c goal <8%) -Controlled -Current medications: Farxiga 10 mg daily Glipizide XL 5 mg daily   [name] 50-1000 mg twice daily -Medications  previously tried: NA  -Current home glucose readings fasting glucose: 113,110,93, 114, 119, 105, 110, 115, 122  post prandial glucose: NA -Denies hypoglycemic/hyperglycemic symptoms -Educated on Benefits of routine self-monitoring of blood sugar; -Continue current medications  Patient Goals/Self-Care Activities Patient will:  - check glucose daily before breakfast, document, and provide at future appointments check blood pressure weekly, document, and provide at future appointments target a minimum of 150 minutes of moderate intensity exercise weekly  Follow Up Plan: Telephone follow up appointment with care management team member scheduled for:  04/02/2021 at 10:00 AM      Patient agreed to services and verbal consent obtained.   The patient verbalized understanding of instructions, educational materials, and care plan provided today and declined offer to receive copy of patient instructions, educational materials, and care plan.   [name], PharmD, [place] [phone]

## 2020-11-10 ENCOUNTER — Ambulatory Visit (INDEPENDENT_AMBULATORY_CARE_PROVIDER_SITE_OTHER): Payer: Medicare Other

## 2020-11-10 DIAGNOSIS — E1122 Type 2 diabetes mellitus with diabetic chronic kidney disease: Secondary | ICD-10-CM

## 2020-11-10 DIAGNOSIS — I10 Essential (primary) hypertension: Secondary | ICD-10-CM | POA: Diagnosis not present

## 2020-11-10 DIAGNOSIS — N1831 Chronic kidney disease, stage 3a: Secondary | ICD-10-CM | POA: Diagnosis not present

## 2020-11-10 NOTE — Chronic Care Management (AMB) (Signed)
Chronic Care Management   CCM RN Visit Note   Name: Carol Ramsey MRN: [id number] DOB: 12/30/1945  Subjective: Carol Ramsey is a 75 y.o. year old female who is a primary care patient of Jerrol Banana., MD. The care management team was consulted for assistance with disease management and care coordination needs.    Engaged with patient by telephone for follow up visit in response to provider referral for case management and/or care coordination services.   Consent to Services:  The patient was given information about Chronic Care Management services, agreed to services, and gave verbal consent prior to initiation of services.  Please see initial visit note for detailed documentation.   Assessment: Review of patient past medical history, allergies, medications, health status, including review of consultants reports, laboratory and other test data, was performed as part of comprehensive evaluation and provision of chronic care management services.   SDOH (Social Determinants of Health) assessments and interventions performed:    CCM Care Plan  Allergies  Allergen Reactions   Gabapentin Swelling   Quinine Nausea Only and Nausea And Vomiting   Amoxicillin Itching and Rash    Has patient had a PCN reaction causing immediate rash, facial/tongue/throat swelling, SOB or lightheadedness with hypotension: No Has patient had a PCN reaction causing severe rash involving mucus membranes or skin necrosis: No Has patient had a PCN reaction that required hospitalization No Has patient had a PCN reaction occurring within the last 10 years: No If all of the above answers are "NO", then may proceed with Cephalosporin use.    Dilaudid  [Hydromorphone Hcl] Rash   Etodolac Rash   Hydrochlorothiazide Rash   Hydrocodone Rash   Hydromorphone Rash   Sulfa Antibiotics Rash    Outpatient Encounter Medications as of 11/10/2020  Medication Sig Note   acetaminophen (TYLENOL) 500 MG tablet Take  500 mg by mouth every 6 (six) hours as needed for mild pain or moderate pain.    ascorbic acid (VITAMIN C) 500 MG tablet Take 500 mg by mouth daily.    atorvastatin (LIPITOR) 10 MG tablet Take 10 mg by mouth daily at 6 PM.     Cholecalciferol (VITAMIN D) 125 MCG (5000 UT) CAPS Take 5,000 Units by mouth daily.    Cyanocobalamin (VITAMIN B-12) 5000 MCG SUBL Place 5,000 mcg under the tongue daily.    dapagliflozin propanediol (FARXIGA) 10 MG TABS tablet Take 1 tablet (10 mg total) by mouth daily.    furosemide (LASIX) 20 MG tablet TAKE 1 TABLET BY MOUTH EVERY DAY 04/02/2020: Reports taking as needed for edema   glipiZIDE (GLUCOTROL XL) 5 MG 24 hr tablet TAKE 1 TABLET BY MOUTH DAILY    glucose blood (ONETOUCH VERIO) test strip Use as instructed; Check blood sugar twice daily    [name] 50-1000 MG tablet TAKE 1 TABLET BY MOUTH TWICE DAILY WITH A MEAL    lisinopril-hydrochlorothiazide (ZESTORETIC) 20-12.5 MG tablet TAKE 2 TABLETS BY MOUTH EVERY MORNING    Magnesium 500 MG CAPS Take 1 capsule (500 mg total) by mouth at bedtime.    metoprolol succinate (TOPROL-XL) 100 MG 24 hr tablet TAKE 1 TABLET BY MOUTH DAILY WITH OR IMMEDIATELY FOLLOWING A MEAL    tamoxifen (NOLVADEX) 20 MG tablet TAKE 1 TABLET(20 MG) BY MOUTH DAILY    No facility-administered encounter medications on file as of 11/10/2020.    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) 02/28/2018   Lumbar facet arthropathy (Bilateral) 02/28/2018   Lumbar intervertebral disc protrusion  02/28/2018   Lumbar nerve root compression 02/28/2018   Morbid obesity with BMI of 40.0-44.9, adult ([place]) 02/27/2018   Hypoalbuminemia 02/27/2018   Hypomagnesemia 02/27/2018   Vitamin B 12 deficiency 02/27/2018   Low serum vitamin B12 02/06/2018   Morbid obesity ([place]) 02/01/2018   Chronic low back pain (Primary Area of Pain) (Bilateral) w/ sciatica (Left) 02/01/2018   Chronic lower extremity pain (Secondary Area of Pain) (Left) 02/01/2018   Chronic pain syndrome 02/01/2018   Pharmacologic therapy 02/01/2018   Disorder of skeletal system 02/01/2018   Problems influencing health status 02/01/2018   Chronic sacroiliac joint pain (Left) 02/01/2018   Bilateral leg edema 02/21/2017   CKD (chronic kidney disease) stage 3, GFR 30-59 ml/min ([place]) 07/21/2016   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   LVH (left ventricular hypertrophy) due to hypertensive disease, without heart failure 01/19/2016   Pulmonary hypertension ([place]) 02/10/2015   Acquired hypothyroidism 09/17/2014   Adaptation reaction 09/17/2014   Absolute anemia 09/17/2014   Cardiac dysrhythmia 09/17/2014   Lumbar facet hypertrophy 09/17/2014   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 09/17/2014   Diabetes mellitus type 2, uncomplicated ([place]) 34/07/7094   Essential (primary) hypertension 09/17/2014   HLD (hyperlipidemia) 09/17/2014   Extreme obesity 09/17/2014   Vitamin D insufficiency 09/17/2014   Beat, premature ventricular 12/11/2013   OBESITY 11/14/2007   Achilles bursitis or tendinitis 11/14/2007   Calcaneal bursitis (heel), unspecified laterality 06/26/2007     Conditions to be addressed/monitored:HTN, HLD, and DMII  Care Plan : Diabetes Type 2 (Adult)     Problem: Disease Progression (Diabetes, Type 2)      Long-Range Goal: Disease Progression Prevented or Minimized   Start Date: 11/10/2020  Expected End Date: 02/08/2021  Priority: High  Note:   Objective:  Lab Results  Component  Value Date   HGBA1C 9.0 (A) 11/05/2020      Current Barriers:  Chronic Disease Management support and educational needs r/t Diabetes self-management.   Case Manager Clinical Goal(s):  Over the next 90 days, patient will demonstrate improved adherence to prescribed treatment plan for Diabetes self-management as evidenced by taking medications as prescribed, daily monitoring and recording of CBG and adherence to ADA/ carb modified diet  Interventions:  Collaboration with Jerrol Banana., MD regarding development and update of comprehensive plan of care as evidenced by provider attestation and co-signature Inter-disciplinary care team collaboration (see longitudinal plan of care) Reviewed medications and compliance with treatment plan. Reports excellent compliance with medications and monitoring blood glucose levels as advised. Reviewed s/sx of hypoglycemia and hyperglycemia. Reviewed recent blood glucose ranges. Reports fasting readings have ranged from 117 to the 120's. Reading today was 118 mg/dl. Encouraged to continue monitoring and maintain a log. Discussed available resources related to diabetes management and nutrition. Declined need for additional resources. She remains compliant with completing recommended exams. Reports significant improvements with her diet. Her diabetes remains uncontrolled but her A1C has improved from 11.1% to 9%. She remains very motivated to achieve optimal glycemic control.  Patient Goals/Self-Care Activities -Self-administer medications as prescribed -Attend all scheduled provider appointments -Monitor blood glucose levels consistently and utilize recommended interventions -Adhere to prescribed ADA/carb modified -Notify provider or care management team with questions and new concerns as needed   Follow Up Plan:  Will follow up in two months.       Care Plan : Hypertension and Hyperlipidemia     Problem: Hypertension and Hyperlipidemia       Long-Range Goal: Hypertension and Hyperlipidemia Monitored Completed 11/10/2020  Start Date: 07/13/2020  Expected End Date: 11/10/2020  Priority: High   Current Barriers:  Chronic Disease Management support and educational needs r/t Hypertension and Hyperlipidemia.  Case Manager Clinical Goal(s):  Over the next 120 days, patient will demonstrate improved adherence to prescribed treatment plan as evidenced by taking all medications as prescribed, monitoring, and recording blood pressure and adhering to a cardiac prudent/heart healthy diet.  Interventions:  Collaboration with Jerrol Banana., MD regarding development and update of comprehensive plan of care as evidenced by provider attestation and co-signature Inter-disciplinary care team collaboration (see longitudinal plan of care) Reviewed medications and treatment plan. Reports excellent medication compliance. Reports improvements with hypertension management and fluid retention. Required lasix once within the last two weeks. She occasionally notes edema to her ankles but reports it has significantly improved. Doing very well with monitoring sodium intake and maintaining a heart healthy diet. Denies need for additional resources r/t hypertension or hyperlipidemia.    Patient Goals/Self-Care Activities: -Self-administer medications as prescribed -Attend all scheduled provider appointments -Monitor and record blood pressure -Adhere to recommended cardiac prudent/heart healthy diet -Notify provider or care management team with questions and new concerns as needed   Goal Met     Plan: Telephone follow up appointment with care management team member scheduled for:  01/12/21    Horris Latino [facility] Care Management [facility] [phone]

## 2020-11-10 NOTE — Patient Instructions (Addendum)
Thank you for allowing the Chronic Care Management team to participate in your care.     Goals Addressed: Care Plan : Diabetes Type 2 (Adult)     Problem: Disease Progression (Diabetes, Type 2)      Long-Range Goal: Disease Progression Prevented or Minimized   Start Date: 11/10/2020  Expected End Date: 02/08/2021  Priority: High  Objective:  Lab Results  Component Value Date   HGBA1C 9.0 (A) 11/05/2020      Current Barriers:  Chronic Disease Management support and educational needs r/t Diabetes self-management.   Case Manager Clinical Goal(s):  Over the next 90 days, patient will demonstrate improved adherence to prescribed treatment plan for Diabetes self-management as evidenced by taking medications as prescribed, daily monitoring and recording of CBG and adherence to ADA/ carb modified diet  Interventions:  Collaboration with Jerrol Banana., MD regarding development and update of comprehensive plan of care as evidenced by provider attestation and co-signature Inter-disciplinary care team collaboration (see longitudinal plan of care) Reviewed medications and compliance with treatment plan. Reports excellent compliance with medications and monitoring blood glucose levels as advised. Reviewed s/sx of hypoglycemia and hyperglycemia. Reviewed recent blood glucose ranges. Reports fasting readings have ranged from 117 to the 120's. Reading today was 118 mg/dl. Encouraged to continue monitoring and maintain a log. Discussed available resources related to diabetes management and nutrition. Declined need for additional resources. She remains compliant with completing recommended exams. Reports significant improvements with her diet. Her diabetes remains uncontrolled but her A1C has improved from 11.1% to 9%. She remains very motivated to achieve optimal glycemic control.   Patient Goals/Self-Care Activities -Self-administer medications as prescribed -Attend all scheduled  provider appointments -Monitor blood glucose levels consistently and utilize recommended interventions -Adhere to prescribed ADA/carb modified -Notify provider or care management team with questions and new concerns as needed   Follow Up Plan:  Will follow up in two months.       Care Plan : Hypertension and Hyperlipidemia     Problem: Hypertension and Hyperlipidemia      Long-Range Goal: Hypertension and Hyperlipidemia Monitored Completed 11/10/2020  Start Date: 07/13/2020  Expected End Date: 11/10/2020  Priority: High   Current Barriers:  Chronic Disease Management support and educational needs r/t Hypertension and Hyperlipidemia.  Case Manager Clinical Goal(s):  Over the next 120 days, patient will demonstrate improved adherence to prescribed treatment plan as evidenced by taking all medications as prescribed, monitoring, and recording blood pressure and adhering to a cardiac prudent/heart healthy diet.  Interventions:  Collaboration with Jerrol Banana., MD regarding development and update of comprehensive plan of care as evidenced by provider attestation and co-signature Inter-disciplinary care team collaboration (see longitudinal plan of care) Reviewed medications and treatment plan. Reports excellent medication compliance. Reports improvements with hypertension management and fluid retention. Required lasix once within the last two weeks. She occasionally notes edema to her ankles but reports it has significantly improved. Doing very well with monitoring sodium intake and maintaining a heart healthy diet. Denies need for additional resources r/t hypertension or hyperlipidemia.    Patient Goals/Self-Care Activities: -Self-administer medications as prescribed -Attend all scheduled provider appointments -Monitor and record blood pressure -Adhere to recommended cardiac prudent/heart healthy diet -Notify provider or care management team with questions and new concerns  as needed   Goal Met        Mrs. Javier verbalized understanding of the information discussed during the telephonic outreach. Declined need for mailed/printed instructions. A member of the  care management team will follow up in two months.   [name] [facility] Care Management [facility] [phone]

## 2020-11-16 ENCOUNTER — Other Ambulatory Visit: Payer: Self-pay | Admitting: *Deleted

## 2020-11-16 DIAGNOSIS — E119 Type 2 diabetes mellitus without complications: Secondary | ICD-10-CM

## 2020-11-16 DIAGNOSIS — I10 Essential (primary) hypertension: Secondary | ICD-10-CM

## 2020-11-16 MED ORDER — METOPROLOL SUCCINATE ER 100 MG PO TB24: ORAL_TABLET | ORAL | 1 refills | Status: DC

## 2020-11-16 MED ORDER — GLIPIZIDE ER 5 MG PO TB24: ORAL_TABLET | ORAL | 3 refills | Status: DC

## 2020-11-17 DIAGNOSIS — L821 Other seborrheic keratosis: Secondary | ICD-10-CM | POA: Diagnosis not present

## 2020-11-17 DIAGNOSIS — D044 Carcinoma in situ of skin of scalp and neck: Secondary | ICD-10-CM | POA: Diagnosis not present

## 2020-11-17 DIAGNOSIS — D2262 Melanocytic nevi of left upper limb, including shoulder: Secondary | ICD-10-CM | POA: Diagnosis not present

## 2020-11-17 DIAGNOSIS — Z85828 Personal history of other malignant neoplasm of skin: Secondary | ICD-10-CM | POA: Diagnosis not present

## 2020-11-17 DIAGNOSIS — X32XXXA Exposure to sunlight, initial encounter: Secondary | ICD-10-CM | POA: Diagnosis not present

## 2020-11-17 DIAGNOSIS — L570 Actinic keratosis: Secondary | ICD-10-CM | POA: Diagnosis not present

## 2020-11-17 DIAGNOSIS — D485 Neoplasm of uncertain behavior of skin: Secondary | ICD-10-CM | POA: Diagnosis not present

## 2020-11-17 DIAGNOSIS — D225 Melanocytic nevi of trunk: Secondary | ICD-10-CM | POA: Diagnosis not present

## 2020-11-17 DIAGNOSIS — D2271 Melanocytic nevi of right lower limb, including hip: Secondary | ICD-10-CM | POA: Diagnosis not present

## 2020-11-17 DIAGNOSIS — C44712 Basal cell carcinoma of skin of right lower limb, including hip: Secondary | ICD-10-CM | POA: Diagnosis not present

## 2020-11-18 DIAGNOSIS — M25552 Pain in left hip: Secondary | ICD-10-CM | POA: Diagnosis not present

## 2020-11-18 DIAGNOSIS — M5416 Radiculopathy, lumbar region: Secondary | ICD-10-CM | POA: Diagnosis not present

## 2020-11-18 DIAGNOSIS — M5136 Other intervertebral disc degeneration, lumbar region: Secondary | ICD-10-CM | POA: Diagnosis not present

## 2020-11-18 DIAGNOSIS — M5451 Vertebrogenic low back pain: Secondary | ICD-10-CM | POA: Diagnosis not present

## 2020-11-18 DIAGNOSIS — M7918 Myalgia, other site: Secondary | ICD-10-CM | POA: Diagnosis not present

## 2020-11-18 DIAGNOSIS — M9905 Segmental and somatic dysfunction of pelvic region: Secondary | ICD-10-CM | POA: Diagnosis not present

## 2020-11-18 DIAGNOSIS — M9903 Segmental and somatic dysfunction of lumbar region: Secondary | ICD-10-CM | POA: Diagnosis not present

## 2020-11-30 ENCOUNTER — Telehealth: Payer: Self-pay

## 2020-11-30 ENCOUNTER — Ambulatory Visit: Payer: Medicare Other | Admitting: Radiation Oncology

## 2020-11-30 NOTE — Progress Notes (Signed)
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    ascorbic acid (VITAMIN C) 500 MG tablet Take 500 mg by mouth daily.    atorvastatin (LIPITOR) 10 MG tablet Take 10 mg by mouth daily at 6 PM.     Cholecalciferol (VITAMIN D) 125 MCG (5000 UT) CAPS Take 5,000 Units by mouth daily.    Cyanocobalamin (VITAMIN B-12) 5000 MCG SUBL Place 5,000 mcg under the tongue daily.    dapagliflozin propanediol (FARXIGA) 10 MG TABS tablet Take 1 tablet (10 mg total) by mouth daily.    furosemide (LASIX) 20 MG tablet TAKE 1 TABLET BY MOUTH EVERY DAY 04/02/2020: Reports taking as needed for edema   glipiZIDE (GLUCOTROL XL) 5 MG 24 hr tablet TAKE 1 TABLET BY MOUTH DAILY    glucose blood (ONETOUCH VERIO) test strip Use as instructed; Check blood sugar twice daily    [name] 50-1000 MG tablet TAKE 1 TABLET BY MOUTH TWICE DAILY WITH A MEAL    lisinopril-hydrochlorothiazide (ZESTORETIC) 20-12.5 MG tablet TAKE 2 TABLETS BY MOUTH EVERY MORNING    Magnesium 500 MG CAPS Take 1 capsule (500 mg total) by mouth at bedtime.    metoprolol succinate (TOPROL-XL) 100 MG 24 hr tablet TAKE 1 TABLET BY MOUTH DAILY WITH OR IMMEDIATELY FOLLOWING A MEAL    tamoxifen (NOLVADEX) 20 MG tablet TAKE 1 TABLET(20 MG) BY MOUTH DAILY    No facility-administered encounter medications on file as  of 11/30/2020.    Care Gaps: Zoster Vaccines- Shingrix FOOT EXAM (last completed 01/26/2017) COVID-19 Vaccine Booster 4 INFLUENZA VACCINE (last completed 01/30/2020)   Star Rating Drugs: Glipizide 5 mg last filled on 11/16/2020 for a 90-Day supply with [facility] Farxiga 10 mg last filled on 11/09/2020 for a 90-Day supply with [facility] Janument 50-1000 mg last filled on 06/15/2020 for a 90-Day supply with [facility] Lisinopril-HCTZ 20.12.5 mg last filled on 01/27/2020 for a 90-Day supply with [facility] Atorvastatin 10 mg last filled on 09/12/2020 for a 90-Day supply with [facility]   Recent Relevant Labs: Lab Results  Component Value Date/Time   HGBA1C 9.0 (A) 11/05/2020 11:20 AM   HGBA1C 11.1 (A) 06/24/2020 10:53 AM   HGBA1C 7.6 (H) 02/19/2020 10:56 AM   HGBA1C 9.5 (H) 05/30/2019 10:13 AM    Kidney Function Lab Results  Component Value Date/Time   CREATININE 1.20 (H) 02/19/2020 10:56 AM   CREATININE 1.21 (H) 05/30/2019 10:13 AM   CREATININE 1.16 (H) 01/26/2017 12:22 PM   CREATININE 1.17 08/31/2011 10:39 AM   CREATININE 1.28 07/28/2011 01:44 AM   GFRNONAA 45 (L) 02/19/2020 10:56 AM   GFRNONAA 49 (L) 08/31/2011 10:39 AM   GFRAA 52 (L) 02/19/2020 10:56 AM   GFRAA 57 (L) 08/31/2011 10:39 AM    Current antihyperglycemic regimen:  Farxiga 10 mg daily Glipizide XL 5 mg daily   [name] 50-1000  mg twice daily  What recent interventions/DTPs have been made to improve glycemic control:  None ID  Have there been any recent hospitalizations or ED visits since last visit with CPP? No  Patient denies hypoglycemic symptoms, including Pale, Sweaty, Shaky, Hungry, Nervous/irritable, and Vision changes  Patient denies hyperglycemic symptoms, including blurry vision, excessive thirst, fatigue, polyuria, and weakness  How often are you checking your blood sugar? Patient reports she takes her blood sugar about every other day in the  morning right before breakfast.  What are your blood sugars ranging?  Before breakfast her last 2 numbers have been 119 and 123  During the week, how often does your blood glucose drop below 70? Never  Are you checking your feet daily/regularly? Patient denies any issues with her feet at this time.  Adherence Review: Is the patient currently on a STATIN medication? Yes Is the patient currently on ACE/ARB medication? Yes Does the patient have >5 day gap between last estimated fill dates? Yes  Patient reports she has been having unusual numbers with her blood pressure. She reports her diastolic number has been a little lower than what she is used to. Her last home reading were 116/58 and when she saw her provider on 07/07 her number was 118/54. She stated that Dr. Rosanna Ramsey has her keeping an eye on it and is thinking about adjusting some of her medications on her next visit of 02/04/2021. Patient denies feeling dizzy, or weak at this time. She stated she doesn't check her blood pressure daily and I did encourage her to try to at least check it every other day. Patient reports that lately her appetite hasn't been that good. She does eat less than what she normally does and some foods that she used to like doesn't taste the same to her anymore so she has started to eat very little. Patient reports when she does eat she does watch her sugar and carb intake.   03/05/2021 @ 1000-Appointment scheduled with Carol Ramsey, CPP  AWV completed on 06/01/2020  08/03 LVM requesting patient to return my call  Carol Ramsey, [place] Pharmacist Assistant Phone: [phone]

## 2020-12-07 ENCOUNTER — Other Ambulatory Visit: Payer: Self-pay | Admitting: Family Medicine

## 2020-12-07 NOTE — Telephone Encounter (Signed)
Requested medication (s) are due for refill today: no  Requested medication (s) are on the active medication list:  yes   Last refill:  12/05/2020  Future visit scheduled: yes   Notes to clinic:  ZERO refills remain on this prescription. Your patient is requesting advance approval of refills for this medication to [facility]   Requested Prescriptions  Pending Prescriptions Disp Refills   lisinopril-hydrochlorothiazide (ZESTORETIC) 20-12.5 MG tablet [Pharmacy Med Name: LISINOPRIL-HCTZ 20/12.5MG  TABLETS] 180 tablet 1    Sig: TAKE 2 TABLETS BY MOUTH EVERY MORNING      Cardiovascular:  ACEI + Diuretic Combos Failed - 12/07/2020  8:09 AM      Failed - Na in normal range and within 180 days    Sodium  Date Value Ref Range Status  02/19/2020 135 134 - 144 mmol/L Final  07/28/2011 140 136 - 145 mmol/L Final          Failed - K in normal range and within 180 days    Potassium  Date Value Ref Range Status  02/19/2020 4.5 3.5 - 5.2 mmol/L Final  07/28/2011 4.2 3.5 - 5.1 mmol/L Final          Failed - Cr in normal range and within 180 days    Creat  Date Value Ref Range Status  01/26/2017 1.16 (H) 0.60 - 0.93 mg/dL Final    Comment:    For patients >89 years of age, the reference limit for Creatinine is approximately 13% higher for people identified as African-American. .    Creatinine, Ser  Date Value Ref Range Status  02/19/2020 1.20 (H) 0.57 - 1.00 mg/dL Final          Failed - Ca in normal range and within 180 days    Calcium  Date Value Ref Range Status  02/19/2020 8.9 8.7 - 10.3 mg/dL Final   Calcium, Total  Date Value Ref Range Status  07/28/2011 8.5 8.5 - 10.1 mg/dL Final          Passed - Patient is not pregnant      Passed - Last BP in normal range    BP Readings from Last 1 Encounters:  11/05/20 (!) 118/54          Passed - Valid encounter within last 6 months    Recent Outpatient Visits           1 month ago Type 2 diabetes mellitus  with stage 3a chronic kidney disease, without long-term current use of insulin ([place])   [facility] Jerrol Banana., MD   4 months ago Type 2 diabetes mellitus without complication, without long-term current use of insulin [facility])   [facility] Jerrol Banana., MD   5 months ago Type 2 diabetes mellitus without complication, without long-term current use of insulin [facility])   [facility] Jerrol Banana., MD   9 months ago Type 2 diabetes mellitus with complication, without long-term current use of insulin [facility])   [facility] Jerrol Banana., MD   1 year ago Type 2 diabetes mellitus with complication, without long-term current use of insulin [facility])   [facility] Jerrol Banana., MD       Future Appointments             In 1 month Jerrol Banana., MD [facility]

## 2020-12-21 ENCOUNTER — Ambulatory Visit: Payer: Medicare Other | Admitting: Radiation Oncology

## 2020-12-21 DIAGNOSIS — I272 Pulmonary hypertension, unspecified: Secondary | ICD-10-CM | POA: Diagnosis not present

## 2020-12-21 DIAGNOSIS — I071 Rheumatic tricuspid insufficiency: Secondary | ICD-10-CM | POA: Diagnosis not present

## 2020-12-21 DIAGNOSIS — R600 Localized edema: Secondary | ICD-10-CM | POA: Diagnosis not present

## 2020-12-21 DIAGNOSIS — I119 Hypertensive heart disease without heart failure: Secondary | ICD-10-CM | POA: Diagnosis not present

## 2020-12-21 DIAGNOSIS — I493 Ventricular premature depolarization: Secondary | ICD-10-CM | POA: Diagnosis not present

## 2020-12-21 DIAGNOSIS — N1831 Chronic kidney disease, stage 3a: Secondary | ICD-10-CM | POA: Diagnosis not present

## 2020-12-21 DIAGNOSIS — E782 Mixed hyperlipidemia: Secondary | ICD-10-CM | POA: Diagnosis not present

## 2020-12-21 DIAGNOSIS — E119 Type 2 diabetes mellitus without complications: Secondary | ICD-10-CM | POA: Diagnosis not present

## 2020-12-21 DIAGNOSIS — I10 Essential (primary) hypertension: Secondary | ICD-10-CM | POA: Diagnosis not present

## 2020-12-23 DIAGNOSIS — M5416 Radiculopathy, lumbar region: Secondary | ICD-10-CM | POA: Diagnosis not present

## 2020-12-23 DIAGNOSIS — M25552 Pain in left hip: Secondary | ICD-10-CM | POA: Diagnosis not present

## 2020-12-23 DIAGNOSIS — M9905 Segmental and somatic dysfunction of pelvic region: Secondary | ICD-10-CM | POA: Diagnosis not present

## 2020-12-23 DIAGNOSIS — M5136 Other intervertebral disc degeneration, lumbar region: Secondary | ICD-10-CM | POA: Diagnosis not present

## 2020-12-23 DIAGNOSIS — M5451 Vertebrogenic low back pain: Secondary | ICD-10-CM | POA: Diagnosis not present

## 2020-12-23 DIAGNOSIS — M7918 Myalgia, other site: Secondary | ICD-10-CM | POA: Diagnosis not present

## 2020-12-23 DIAGNOSIS — M9903 Segmental and somatic dysfunction of lumbar region: Secondary | ICD-10-CM | POA: Diagnosis not present

## 2020-12-28 ENCOUNTER — Ambulatory Visit
Admission: RE | Admit: 2020-12-28 | Discharge: 2020-12-28 | Disposition: A | Discharge status: Home / Self Care | Payer: Medicare Other | Source: Ambulatory Visit | Attending: Radiation Oncology | Admitting: Radiation Oncology

## 2020-12-28 ENCOUNTER — Encounter: Payer: Self-pay | Admitting: Radiation Oncology

## 2020-12-28 VITALS — BP 125/94 | HR 66 | Temp 97.7°F | Resp 20 | Wt 203.6 lb

## 2020-12-28 DIAGNOSIS — D0511 Intraductal carcinoma in situ of right breast: Secondary | ICD-10-CM | POA: Diagnosis not present

## 2020-12-28 DIAGNOSIS — Z923 Personal history of irradiation: Secondary | ICD-10-CM | POA: Insufficient documentation

## 2020-12-28 DIAGNOSIS — Z79810 Long term (current) use of selective estrogen receptor modulators (SERMs): Secondary | ICD-10-CM | POA: Insufficient documentation

## 2020-12-28 DIAGNOSIS — Z170 Estrogen receptor positive status [ER+]: Secondary | ICD-10-CM | POA: Diagnosis not present

## 2020-12-28 DIAGNOSIS — Z08 Encounter for follow-up examination after completed treatment for malignant neoplasm: Secondary | ICD-10-CM | POA: Diagnosis not present

## 2020-12-28 NOTE — Progress Notes (Signed)
Radiation Oncology Follow up Note  Name: Carol Ramsey   Date:   12/28/2020 MRN:  [id number] DOB: 06/20/1945    This 75 y.o. 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  REFERRING PROVIDER: Jerrol Banana.,*  HPI: Patient is a 75 year old female now out over 4 years having completed whole breast radiation to her right breast for ER/PR positive ductal carcinoma in situ.  Seen today in routine follow-up she is doing well specifically denies breast tenderness cough or bone pain..  She had mammograms back in February which I have reviewed were BI-RADS 1 negative.  She is currently on tamoxifen tolerant well without side effect.  COMPLICATIONS OF TREATMENT: none  FOLLOW UP COMPLIANCE: keeps appointments   PHYSICAL EXAM:  BP (!) 125/94   Pulse 66   Temp 97.7 F (36.5 C)   Resp 20   Wt 203 lb 9.6 oz (92.4 kg)   SpO2 99%   BMI 36.07 kg/m  Lungs are clear to A&P cardiac examination essentially unremarkable with regular rate and rhythm. No dominant mass or nodularity is noted in either breast in 2 positions examined. Incision is well-healed. No axillary or supraclavicular adenopathy is appreciated. Cosmetic result is excellent.  Well-developed well-nourished patient in NAD. HEENT reveals PERLA, EOMI, discs not visualized.  Oral cavity is clear. No oral mucosal lesions are identified. Neck is clear without evidence of cervical or supraclavicular adenopathy. Lungs are clear to A&P. Cardiac examination is essentially unremarkable with regular rate and rhythm without murmur rub or thrill. Abdomen is benign with no organomegaly or masses noted. Motor sensory and DTR levels are equal and symmetric in the upper and lower extremities. Cranial nerves II through XII are grossly intact. Proprioception is intact. No peripheral adenopathy or edema is identified. No motor or sensory levels  are noted. Crude visual fields are within normal range.  RADIOLOGY RESULTS: Mammograms reviewed compatible with above-stated findings  PLAN: Present time patient continues to do well out over 4 years with no evidence of disease.  She continues on tamoxifen without side effect.  I am going to discontinue follow-up care at this point.  I be happy to reevaluate the patient at any time should further consultation be indicated.  I would like to take this opportunity to thank you for allowing me to participate in the care of your patient.Noreene Filbert, MD

## 2020-12-31 DIAGNOSIS — H0102A Squamous blepharitis right eye, upper and lower eyelids: Secondary | ICD-10-CM | POA: Diagnosis not present

## 2020-12-31 DIAGNOSIS — Z23 Encounter for immunization: Secondary | ICD-10-CM | POA: Diagnosis not present

## 2020-12-31 DIAGNOSIS — E113293 Type 2 diabetes mellitus with mild nonproliferative diabetic retinopathy without macular edema, bilateral: Secondary | ICD-10-CM | POA: Diagnosis not present

## 2020-12-31 DIAGNOSIS — H0102B Squamous blepharitis left eye, upper and lower eyelids: Secondary | ICD-10-CM | POA: Diagnosis not present

## 2020-12-31 DIAGNOSIS — H524 Presbyopia: Secondary | ICD-10-CM | POA: Diagnosis not present

## 2020-12-31 DIAGNOSIS — H2513 Age-related nuclear cataract, bilateral: Secondary | ICD-10-CM | POA: Diagnosis not present

## 2020-12-31 LAB — HM DIABETES EYE EXAM

## 2021-01-11 DIAGNOSIS — H2511 Age-related nuclear cataract, right eye: Secondary | ICD-10-CM | POA: Diagnosis not present

## 2021-01-11 DIAGNOSIS — H2513 Age-related nuclear cataract, bilateral: Secondary | ICD-10-CM | POA: Diagnosis not present

## 2021-01-11 DIAGNOSIS — I10 Essential (primary) hypertension: Secondary | ICD-10-CM | POA: Diagnosis not present

## 2021-01-12 ENCOUNTER — Telehealth: Payer: Self-pay

## 2021-01-12 ENCOUNTER — Telehealth: Payer: Medicare Other

## 2021-01-12 NOTE — Telephone Encounter (Signed)
  Care Management   Follow Up Note   01/12/2021 Name: Carol Ramsey MRN: [id number] DOB: [birth year]   Primary Care Provider: Jerrol Banana., MD Reason for referral : Chronic Care Management   An unsuccessful telephone outreach was attempted today. The patient was referred to the case management team for assistance with care management and care coordination.    Follow Up Plan:  A HIPAA compliant voice message was left today requesting a return call.   [name] [facility] Care Management [facility] [phone]

## 2021-01-18 ENCOUNTER — Telehealth: Payer: Self-pay

## 2021-01-18 NOTE — Telephone Encounter (Signed)
  Care Management   Follow Up Note   01/18/2021 Name: Breeze L Viles MRN: [id number] DOB: 1946-03-03   Primary Care Provider: Jerrol Banana., MD Reason for referral : Chronic Care Management   Message received from clinic staff. Staff was contacted by patient regarding appointment/encounter. Attempted to reach Mrs. Battaglia to discuss. Left a HIPAA compliant voice message requesting a return call.   [name] [facility] Care Management [place] [phone]

## 2021-01-19 ENCOUNTER — Ambulatory Visit (INDEPENDENT_AMBULATORY_CARE_PROVIDER_SITE_OTHER): Payer: Medicare Other

## 2021-01-19 DIAGNOSIS — I10 Essential (primary) hypertension: Secondary | ICD-10-CM

## 2021-01-19 DIAGNOSIS — E1122 Type 2 diabetes mellitus with diabetic chronic kidney disease: Secondary | ICD-10-CM

## 2021-01-19 NOTE — Patient Instructions (Addendum)
Thank you for allowing the Chronic Care Management team to participate in your care.     Patient Care Plan: Diabetes Type 2 (Adult)     Problem Identified: Disease Progression (Diabetes, Type 2)      Long-Range Goal: Disease Progression Prevented or Minimized   Start Date: 11/10/2020  Expected End Date: 02/08/2021  Priority: High  Note:   Objective:  Lab Results  Component Value Date   HGBA1C 9.0 (A) 11/05/2020      Current Barriers:  Chronic Disease Management support and educational needs r/t Diabetes self-management.   Case Manager Clinical Goal(s):  Over the next 90 days, patient will demonstrate improved adherence to prescribed treatment plan for Diabetes self-management as evidenced by taking medications as prescribed, daily monitoring and recording of CBG and adherence to ADA/ carb modified diet  Interventions:  Collaboration with Carol Ramsey., MD regarding development and update of comprehensive plan of care as evidenced by provider attestation and co-signature Inter-disciplinary care team collaboration (see longitudinal plan of care) Reviewed medications and compliance with treatment plan. Reports excellent compliance with medications and monitoring blood glucose levels as advised. Reports doing well with [name]. Reviewed s/sx of hypoglycemia and hyperglycemia. Reviewed recent blood glucose ranges. Reports fasting readings have ranged from 97 to 119 mg/dl.  Encouraged to continue monitoring and maintain a log. Discussed pending appointments. She remains compliant with completing recommended exams.  Reports significant improvements with her diet. Her diabetes remains uncontrolled but her A1C has improved from 11.1% to 9%. She remains very motivated to achieve optimal glycemic control.   Patient Goals/Self-Care Activities Self-administer medications as prescribed Attend all scheduled provider appointments Monitor blood glucose levels consistently and utilize  recommended interventions Adhere to prescribed ADA/carb modified Notify provider or care management team with questions and new concerns as needed   Follow Up Plan:  Will follow up next month         Carol Ramsey verbalized understanding of the information discussed during the telephonic outreach. Declined need for mailed/printed instructions. A member of the care management team will follow up next month.    Carol Ramsey [facility] Care Management [facility] [phone]

## 2021-01-19 NOTE — Chronic Care Management (AMB) (Signed)
Chronic Care Management   CCM RN Visit Note  01/19/2021 Name: Carol Ramsey MRN: [id number] DOB: [birth year]  Subjective: Carol Ramsey is a 75 y.o. year old female who is a primary care patient of Jerrol Banana., MD. The care management team was consulted for assistance with disease management and care coordination needs.    Several calls received from patient today. Engaged with patient by telephone for follow up visit in response to provider referral for case management and care coordination services.   Consent to Services:  The patient was given information about Chronic Care Management services, agreed to services, and gave verbal consent prior to initiation of services.  Please see initial visit note for detailed documentation.    Assessment: Review of patient past medical history, allergies, medications, health status, including review of consultants reports, laboratory and other test data, was performed as part of comprehensive evaluation and provision of chronic care management services.   SDOH (Social Determinants of Health) assessments and interventions performed:  No  CCM Care Plan  Allergies  Allergen Reactions   Gabapentin Swelling   Quinine Nausea Only and Nausea And Vomiting   Amoxicillin Itching and Rash    Has patient had a PCN reaction causing immediate rash, facial/tongue/throat swelling, SOB or lightheadedness with hypotension: No Has patient had a PCN reaction causing severe rash involving mucus membranes or skin necrosis: No Has patient had a PCN reaction that required hospitalization No Has patient had a PCN reaction occurring within the last 10 years: No If all of the above answers are "NO", then may proceed with Cephalosporin use.    Dilaudid  [Hydromorphone Hcl] Rash   Etodolac Rash   Hydrochlorothiazide Rash   Hydrocodone Rash   Hydromorphone Rash   Sulfa Antibiotics Rash    Outpatient Encounter Medications as of 01/19/2021  Medication Sig  Note   acetaminophen (TYLENOL) 500 MG tablet Take 500 mg by mouth every 6 (six) hours as needed for mild pain or moderate pain.    ascorbic acid (VITAMIN C) 500 MG tablet Take 500 mg by mouth daily.    atorvastatin (LIPITOR) 10 MG tablet Take 10 mg by mouth daily at 6 PM.     Cholecalciferol (VITAMIN D) 125 MCG (5000 UT) CAPS Take 5,000 Units by mouth daily.    Cyanocobalamin (VITAMIN B-12) 5000 MCG SUBL Place 5,000 mcg under the tongue daily.    dapagliflozin propanediol (FARXIGA) 10 MG TABS tablet Take 1 tablet (10 mg total) by mouth daily.    furosemide (LASIX) 20 MG tablet TAKE 1 TABLET BY MOUTH EVERY DAY 04/02/2020: Reports taking as needed for edema   glipiZIDE (GLUCOTROL XL) 5 MG 24 hr tablet TAKE 1 TABLET BY MOUTH DAILY    glucose blood (ONETOUCH VERIO) test strip Use as instructed; Check blood sugar twice daily    [name] 50-1000 MG tablet TAKE 1 TABLET BY MOUTH TWICE DAILY WITH A MEAL    lisinopril-hydrochlorothiazide (ZESTORETIC) 20-12.5 MG tablet TAKE 2 TABLETS BY MOUTH EVERY MORNING    Magnesium 500 MG CAPS Take 1 capsule (500 mg total) by mouth at bedtime.    metoprolol succinate (TOPROL-XL) 100 MG 24 hr tablet TAKE 1 TABLET BY MOUTH DAILY WITH OR IMMEDIATELY FOLLOWING A MEAL    tamoxifen (NOLVADEX) 20 MG tablet TAKE 1 TABLET(20 MG) BY MOUTH DAILY    No facility-administered encounter medications on file as of 01/19/2021.    Patient Active Problem List   Diagnosis Date Noted   OA (osteoarthritis)  of knee 10/30/2018   Lymphedema 10/30/2018   DDD (degenerative disc disease), lumbar 02/28/2018   Osteoarthritis of sacroiliac joint (Left) 02/28/2018   Somatic dysfunction of sacroiliac joint (Left) 02/28/2018   Sacroiliac joint dysfunction (Left) 02/28/2018   Abnormal MRI, lumbar spine (07/22/2013) 02/28/2018   Lumbar facet syndrome (Bilateral) 02/28/2018   Lumbar spondylosis 02/28/2018   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) 02/28/2018   Lumbar facet arthropathy (Bilateral)  02/28/2018   Lumbar intervertebral disc protrusion 02/28/2018   Lumbar nerve root compression 02/28/2018   Morbid obesity with BMI of 40.0-44.9, adult ([place]) 02/27/2018   Hypoalbuminemia 02/27/2018   Hypomagnesemia 02/27/2018   Vitamin B 12 deficiency 02/27/2018   Low serum vitamin B12 02/06/2018   Morbid obesity ([place]) 02/01/2018   Chronic low back pain (Primary Area of Pain) (Bilateral) w/ sciatica (Left) 02/01/2018   Chronic lower extremity pain (Secondary Area of Pain) (Left) 02/01/2018   Chronic pain syndrome 02/01/2018   Pharmacologic therapy 02/01/2018   Disorder of skeletal system 02/01/2018   Problems influencing health status 02/01/2018   Chronic sacroiliac joint pain (Left) 02/01/2018   Bilateral leg edema 02/21/2017   CKD (chronic kidney disease) stage 3, GFR 30-59 ml/min ([place]) 07/21/2016   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   LVH (left ventricular hypertrophy) due to hypertensive disease, without heart failure 01/19/2016   Pulmonary hypertension ([place]) 02/10/2015   Acquired hypothyroidism 09/17/2014   Adaptation reaction 09/17/2014   Absolute anemia 09/17/2014   Cardiac dysrhythmia 09/17/2014   Lumbar facet hypertrophy 09/17/2014   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 09/17/2014   Diabetes mellitus type 2, uncomplicated ([place]) 02/72/5366   Essential (primary) hypertension 09/17/2014   HLD (hyperlipidemia) 09/17/2014   Extreme obesity 09/17/2014   Vitamin D insufficiency 09/17/2014   Beat, premature ventricular 12/11/2013   OBESITY 11/14/2007   Achilles bursitis or tendinitis 11/14/2007   Calcaneal bursitis (heel), unspecified laterality 06/26/2007    Conditions to be addressed/monitored: DMII  Patient Care Plan: Diabetes Type 2 (Adult)     Problem Identified: Disease Progression (Diabetes, Type 2)      Long-Range Goal: Disease Progression Prevented or Minimized   Start Date: 11/10/2020  Expected End Date: 02/08/2021  Priority:  High  Note:   Objective:  Lab Results  Component Value Date   HGBA1C 9.0 (A) 11/05/2020      Current Barriers:  Chronic Disease Management support and educational needs r/t Diabetes self-management.   Case Manager Clinical Goal(s):  Over the next 90 days, patient will demonstrate improved adherence to prescribed treatment plan for Diabetes self-management as evidenced by taking medications as prescribed, daily monitoring and recording of CBG and adherence to ADA/ carb modified diet  Interventions:  Collaboration with Jerrol Banana., MD regarding development and update of comprehensive plan of care as evidenced by provider attestation and co-signature Inter-disciplinary care team collaboration (see longitudinal plan of care) Reviewed medications and compliance with treatment plan. Reports excellent compliance with medications and monitoring blood glucose levels as advised. Reports doing well with [name]. Reviewed s/sx of hypoglycemia and hyperglycemia. Reviewed recent blood glucose ranges. Reports fasting readings have ranged from 97 to 119 mg/dl.  Encouraged to continue monitoring and maintain a log. Discussed pending appointments. She remains compliant with completing recommended exams.  Reports significant improvements with her diet. Her diabetes remains uncontrolled but her A1C has improved from 11.1% to 9%. She remains very motivated to achieve optimal glycemic control.   Patient Goals/Self-Care Activities Self-administer medications as  prescribed Attend all scheduled provider appointments Monitor blood glucose levels consistently and utilize recommended interventions Adhere to prescribed ADA/carb modified Notify provider or care management team with questions and new concerns as needed   Follow Up Plan:  Will follow up next month         PLAN: A member of the care management team will follow up next month.    [name] [facility] Care  Management [facility] [phone]

## 2021-01-20 DIAGNOSIS — M9903 Segmental and somatic dysfunction of lumbar region: Secondary | ICD-10-CM | POA: Diagnosis not present

## 2021-01-20 DIAGNOSIS — M7918 Myalgia, other site: Secondary | ICD-10-CM | POA: Diagnosis not present

## 2021-01-20 DIAGNOSIS — M25552 Pain in left hip: Secondary | ICD-10-CM | POA: Diagnosis not present

## 2021-01-20 DIAGNOSIS — M5136 Other intervertebral disc degeneration, lumbar region: Secondary | ICD-10-CM | POA: Diagnosis not present

## 2021-01-20 DIAGNOSIS — M5416 Radiculopathy, lumbar region: Secondary | ICD-10-CM | POA: Diagnosis not present

## 2021-01-20 DIAGNOSIS — M5451 Vertebrogenic low back pain: Secondary | ICD-10-CM | POA: Diagnosis not present

## 2021-01-20 DIAGNOSIS — M9905 Segmental and somatic dysfunction of pelvic region: Secondary | ICD-10-CM | POA: Diagnosis not present

## 2021-01-26 ENCOUNTER — Encounter: Payer: Self-pay | Admitting: *Deleted

## 2021-01-27 ENCOUNTER — Other Ambulatory Visit: Payer: Self-pay | Admitting: Oncology

## 2021-01-28 DIAGNOSIS — H2512 Age-related nuclear cataract, left eye: Secondary | ICD-10-CM | POA: Diagnosis not present

## 2021-01-28 DIAGNOSIS — Z961 Presence of intraocular lens: Secondary | ICD-10-CM | POA: Diagnosis not present

## 2021-01-28 DIAGNOSIS — H2511 Age-related nuclear cataract, right eye: Secondary | ICD-10-CM | POA: Diagnosis not present

## 2021-01-29 DIAGNOSIS — E1122 Type 2 diabetes mellitus with diabetic chronic kidney disease: Secondary | ICD-10-CM

## 2021-01-29 DIAGNOSIS — N1831 Chronic kidney disease, stage 3a: Secondary | ICD-10-CM | POA: Diagnosis not present

## 2021-02-01 ENCOUNTER — Telehealth: Payer: Self-pay

## 2021-02-04 ENCOUNTER — Other Ambulatory Visit: Payer: Self-pay

## 2021-02-04 ENCOUNTER — Encounter: Payer: Self-pay | Admitting: Family Medicine

## 2021-02-04 ENCOUNTER — Ambulatory Visit (INDEPENDENT_AMBULATORY_CARE_PROVIDER_SITE_OTHER): Payer: Medicare Other | Admitting: Family Medicine

## 2021-02-04 VITALS — BP 133/46 | HR 53 | Temp 97.9°F | Wt 198.0 lb

## 2021-02-04 DIAGNOSIS — M5416 Radiculopathy, lumbar region: Secondary | ICD-10-CM

## 2021-02-04 DIAGNOSIS — I890 Lymphedema, not elsewhere classified: Secondary | ICD-10-CM

## 2021-02-04 DIAGNOSIS — D0511 Intraductal carcinoma in situ of right breast: Secondary | ICD-10-CM

## 2021-02-04 DIAGNOSIS — E7800 Pure hypercholesterolemia, unspecified: Secondary | ICD-10-CM

## 2021-02-04 DIAGNOSIS — M5442 Lumbago with sciatica, left side: Secondary | ICD-10-CM | POA: Diagnosis not present

## 2021-02-04 DIAGNOSIS — N1831 Chronic kidney disease, stage 3a: Secondary | ICD-10-CM

## 2021-02-04 DIAGNOSIS — I272 Pulmonary hypertension, unspecified: Secondary | ICD-10-CM | POA: Diagnosis not present

## 2021-02-04 DIAGNOSIS — G8929 Other chronic pain: Secondary | ICD-10-CM

## 2021-02-04 DIAGNOSIS — I10 Essential (primary) hypertension: Secondary | ICD-10-CM | POA: Diagnosis not present

## 2021-02-04 DIAGNOSIS — E1122 Type 2 diabetes mellitus with diabetic chronic kidney disease: Secondary | ICD-10-CM

## 2021-02-04 DIAGNOSIS — E559 Vitamin D deficiency, unspecified: Secondary | ICD-10-CM | POA: Diagnosis not present

## 2021-02-04 DIAGNOSIS — E039 Hypothyroidism, unspecified: Secondary | ICD-10-CM | POA: Diagnosis not present

## 2021-02-04 NOTE — Progress Notes (Signed)
Established patient visit   Patient: Carol Ramsey   DOB: 07/23/1945   75 y.o. Female  MRN: [id number] Visit Date: 02/04/2021  Today's healthcare provider: [name], MD   Chief Complaint  Patient presents with   Diabetes   Subjective    HPI  Patient comes in today for follow-up.  She has had her flu shot.  She continues to complain of back pain for which she is not getting relief from psychiatry/pain medicine. Other than back pain she is doing fairly well. Leg swelling is doing better since she lost a little weight. Diabetes Mellitus Type II, Follow-up  Lab Results  Component Value Date   HGBA1C 9.0 (A) 11/05/2020   HGBA1C 11.1 (A) 06/24/2020   HGBA1C 7.6 (H) 02/19/2020   Wt Readings from Last 3 Encounters:  02/04/21 198 lb (89.8 kg)  12/28/20 203 lb 9.6 oz (92.4 kg)  11/05/20 207 lb (93.9 kg)   Last seen for diabetes 3 months ago.  Management since then includes no changes. She reports excellent compliance with treatment. She is not having side effects.  Symptoms: No fatigue No foot ulcerations  No appetite changes No nausea  No paresthesia of the feet  No polydipsia  No polyuria No visual disturbances   No vomiting     Home blood sugar records: fasting range: 90-low 100's  Episodes of hypoglycemia? No   Current insulin regiment: None  Most Recent Eye Exam: 12/31/2020 Current exercise: none Current diet habits: in general, a "healthy" diet    Pertinent Labs: Lab Results  Component Value Date   CHOL 121 02/19/2020   HDL 61 02/19/2020   LDLCALC 42 02/19/2020   TRIG 97 02/19/2020   CHOLHDL 2.0 02/19/2020   Lab Results  Component Value Date   NA 135 02/19/2020   K 4.5 02/19/2020   CREATININE 1.20 (H) 02/19/2020   GFRNONAA 45 (L) 02/19/2020   GLUCOSE 170 (H) 02/19/2020     --------------------------------------------------------------------------------------------------- Hypertension, follow-up  BP Readings from Last 3 Encounters:   02/04/21 (!) 133/46  12/28/20 (!) 125/94  11/05/20 (!) 118/54   Wt Readings from Last 3 Encounters:  02/04/21 198 lb (89.8 kg)  12/28/20 203 lb 9.6 oz (92.4 kg)  11/05/20 207 lb (93.9 kg)     She was last seen for hypertension 3 months ago.  BP at that visit was 118/54. Management since that visit includes no changes.  She reports excellent compliance with treatment. She is not having side effects.  She is following a Regular diet. She is not exercising. She does not smoke.  Use of agents associated with hypertension: none.   Outside blood pressures are normal at home.   Pertinent labs: Lab Results  Component Value Date   CHOL 121 02/19/2020   HDL 61 02/19/2020   LDLCALC 42 02/19/2020   TRIG 97 02/19/2020   CHOLHDL 2.0 02/19/2020   Lab Results  Component Value Date   NA 135 02/19/2020   K 4.5 02/19/2020   CREATININE 1.20 (H) 02/19/2020   GFRNONAA 45 (L) 02/19/2020   GLUCOSE 170 (H) 02/19/2020     The ASCVD Risk score (Arnett DK, et al., 2019) failed to calculate for the following reasons:   The valid total cholesterol range is 130 to 320 mg/dL   ---------------------------------------------------------------------------------------------------     Medications: Outpatient Medications Prior to Visit  Medication Sig   acetaminophen (TYLENOL) 500 MG tablet Take 500 mg by mouth every 6 (six) hours as needed  for mild pain or moderate pain.   ascorbic acid (VITAMIN C) 500 MG tablet Take 500 mg by mouth daily.   atorvastatin (LIPITOR) 10 MG tablet Take 10 mg by mouth daily at 6 PM.    Cholecalciferol (VITAMIN D) 125 MCG (5000 UT) CAPS Take 5,000 Units by mouth daily.   Cyanocobalamin (VITAMIN B-12) 5000 MCG SUBL Place 5,000 mcg under the tongue daily.   dapagliflozin propanediol (FARXIGA) 10 MG TABS tablet Take 1 tablet (10 mg total) by mouth daily.   furosemide (LASIX) 20 MG tablet TAKE 1 TABLET BY MOUTH EVERY DAY   glipiZIDE (GLUCOTROL XL) 5 MG 24 hr tablet TAKE  1 TABLET BY MOUTH DAILY   glucose blood (ONETOUCH VERIO) test strip Use as instructed; Check blood sugar twice daily   [name] 50-1000 MG tablet TAKE 1 TABLET BY MOUTH TWICE DAILY WITH A MEAL   lisinopril-hydrochlorothiazide (ZESTORETIC) 20-12.5 MG tablet TAKE 2 TABLETS BY MOUTH EVERY MORNING   Magnesium 500 MG CAPS Take 1 capsule (500 mg total) by mouth at bedtime.   metoprolol succinate (TOPROL-XL) 100 MG 24 hr tablet TAKE 1 TABLET BY MOUTH DAILY WITH OR IMMEDIATELY FOLLOWING A MEAL   tamoxifen (NOLVADEX) 20 MG tablet TAKE 1 TABLET(20 MG) BY MOUTH DAILY   No facility-administered medications prior to visit.    Review of Systems  Constitutional: Negative.   Respiratory: Negative.    Cardiovascular: Negative.   Gastrointestinal: Negative.   Endocrine: Negative.   Skin:  Negative for wound.  Neurological:  Negative for dizziness, syncope, speech difficulty, light-headedness, numbness and headaches.      Objective    BP (!) 133/46 (BP Location: Left Arm, Patient Position: Sitting, Cuff Size: Large)   Pulse (!) 53   Temp 97.9 F (36.6 C) (Oral)   Wt 198 lb (89.8 kg)   SpO2 100%   BMI 35.07 kg/m    Physical Exam Vitals and nursing note reviewed.  Constitutional:      Appearance: She is obese.  HENT:     Right Ear: Tympanic membrane normal.     Left Ear: Tympanic membrane normal.     Nose: Nose normal.     Mouth/Throat:     Mouth: Mucous membranes are moist.     Pharynx: Oropharynx is clear.  Eyes:     Conjunctiva/sclera: Conjunctivae normal.  Cardiovascular:     Rate and Rhythm: Normal rate and regular rhythm.     Pulses: Normal pulses.     Heart sounds: Normal heart sounds.  Pulmonary:     Effort: Pulmonary effort is normal.     Breath sounds: Normal breath sounds.  Abdominal:     General: Bowel sounds are normal.     Palpations: Abdomen is soft.  Musculoskeletal:     Cervical back: Normal range of motion and neck supple.     Comments: Lymphedema only today.   Skin:    General: Skin is warm and dry.     Comments: Very fair skin.  Neurological:     General: No focal deficit present.     Mental Status: She is alert and oriented to person, place, and time.  Psychiatric:        Mood and Affect: Mood normal.        Behavior: Behavior normal.        Thought Content: Thought content normal.        Judgment: Judgment normal.      No results found for any visits on 02/04/21.  Assessment &  Plan     1. Type 2 diabetes mellitus with stage 3a chronic kidney disease, without long-term current use of insulin ([name]) A1c improved from 9.0-7.8.  Well on Farxiga glipizide [name].  Follow-up 6 months - POCT glycosylated hemoglobin (Hb A1C)  2. Essential (primary) hypertension Good control on lisinopril HCT  3. Pulmonary hypertension ([place])   4. Acquired hypothyroidism   5. Chronic low back pain (Primary Area of Pain) (Bilateral) w/ sciatica (Left) Followed by pain clinic/physiatry  6. Lumbar nerve root compression   7. Vitamin D insufficiency   8. Morbid obesity ([place]) Patient has lost almost 10 pounds since December  9. Lymphedema   10. Pure hypercholesterolemia Atorvastatin  11. Ductal carcinoma in situ (DCIS) of right breast On tamoxifen   No follow-ups on file.      I, [name], MD, have reviewed all documentation for this visit. The documentation on 02/07/21 for the exam, diagnosis, procedures, and orders are all accurate and complete.    Theran Vandergrift Cranford Mon, MD  [facility] [phone] (phone) [phone] (fax)  [place]

## 2021-02-11 ENCOUNTER — Telehealth: Payer: Self-pay

## 2021-02-11 NOTE — Progress Notes (Signed)
Chronic Care Management Pharmacy Assistant   Name: Carol Ramsey  MRN: [id number] DOB: 06/02/1945  Patient Assistance Renewal [name]/New PAP for Carol Ramsey  I received a task to complete renewal paperwork for patient assistance for this patient's medication. I reached out to the patient to verify what medications that she currently receive via patient assistance. Patient stated that she was denied for Carol Ramsey and currently uses a coupon to get the medication. Patient would like me to try to see if she will qualify for Carol Ramsey now so I will do a new application with Carol Ramsey for this medication today.  Patient instructed that she does get her [name] through Carol Ramsey and that she does need to have this medication renewed. I informed the patient that I would be the one starting her renewal application process, and that once I complete my portion I will e-mail the application to Carol Ramsey, CPP for printing so she can complete her portion of the application. Patient stated that her spouse has an appointment next week at the clinic so she would like to pick it up at that time. Patient stated she would complete her part, and return application same day so Carol Ramsey can mail it to Carol Ramsey.  Prior to completing the application I contacted Carol Ramsey to verify if they had a fax number of if they this application can only be mailed to their address. The representative I spoke with stated that for [name] the applications can only be mailed in to Carol Ramsey patient [place] [address]. I asked the representative what is the latest date the renewal application can be received by, and she responded it depends on the insurance. If a patient has Medicare Part D than the application has to be in by 05/01/2021 for renewal for the 2023 year. This patient does not have Medicare part D so I provided the representative the patient's information so she could provide me with the proper renewal date. According to  the representative since this patient does not have Medicare Part D her application does not expire until 03/55/9741 the renewal application can be submitted starting in February. She advised that the patient's last refill for this medication was 01/21/2021 for a 90-Day supply and she has one refill left. Patient's next refill can be requested 14 days prior to her running out of this medication which will be December.   I reached back out to this patient to provide her with all updated information regarding her [name] renewal process. Patient verbalized understanding to all.   I completed the patient's Carol Ramsey patient assistance application online for her Carol Ramsey and the patient was approved from 02/11/2021 to 05/01/2022. I will send the prescription part of this application over to Carol Ramsey, CPP for him to print and have provider sign for faxing to Carol Ramsey at [phone].   Reached back out to the patient and provided her with the update regarding her Carol Ramsey, and informed her that I do have to wait until Carol Ramsey returns on Monday to have him fax over the prescription, and once that has been done I contact Carol Ramsey to have the ship out her first supply. Patient verbalized understanding to all, and was encouraged to contact me for any questions.   Medications: Outpatient Encounter Medications as of 02/11/2021  Medication Sig Note   acetaminophen (TYLENOL) 500 MG tablet Take 500 mg by mouth every 6 (six) hours as needed for mild pain or moderate pain.    ascorbic acid (VITAMIN C)  500 MG tablet Take 500 mg by mouth daily.    atorvastatin (LIPITOR) 10 MG tablet Take 10 mg by mouth daily at 6 PM.     Cholecalciferol (VITAMIN D) 125 MCG (5000 UT) CAPS Take 5,000 Units by mouth daily.    Cyanocobalamin (VITAMIN B-12) 5000 MCG SUBL Place 5,000 mcg under the tongue daily.    dapagliflozin propanediol (FARXIGA) 10 MG TABS tablet Take 1 tablet (10 mg total) by mouth daily.    furosemide (LASIX) 20 MG tablet TAKE 1  TABLET BY MOUTH EVERY DAY 04/02/2020: Reports taking as needed for edema   glipiZIDE (GLUCOTROL XL) 5 MG 24 hr tablet TAKE 1 TABLET BY MOUTH DAILY    glucose blood (ONETOUCH VERIO) test strip Use as instructed; Check blood sugar twice daily    [name] 50-1000 MG tablet TAKE 1 TABLET BY MOUTH TWICE DAILY WITH A MEAL    lisinopril-hydrochlorothiazide (ZESTORETIC) 20-12.5 MG tablet TAKE 2 TABLETS BY MOUTH EVERY MORNING    Magnesium 500 MG CAPS Take 1 capsule (500 mg total) by mouth at bedtime.    metoprolol succinate (TOPROL-XL) 100 MG 24 hr tablet TAKE 1 TABLET BY MOUTH DAILY WITH OR IMMEDIATELY FOLLOWING A MEAL    tamoxifen (NOLVADEX) 20 MG tablet TAKE 1 TABLET(20 MG) BY MOUTH DAILY    No facility-administered encounter medications on file as of 02/11/2021.    Carol Ramsey, CPA/CMA Clinical Pharmacist Assistant Phone: [phone]

## 2021-02-12 LAB — POCT GLYCOSYLATED HEMOGLOBIN (HGB A1C): Hemoglobin A1C: 7.8 % — AB (ref 4.0–5.6)

## 2021-02-17 DIAGNOSIS — M9905 Segmental and somatic dysfunction of pelvic region: Secondary | ICD-10-CM | POA: Diagnosis not present

## 2021-02-17 DIAGNOSIS — M5451 Vertebrogenic low back pain: Secondary | ICD-10-CM | POA: Diagnosis not present

## 2021-02-17 DIAGNOSIS — M25552 Pain in left hip: Secondary | ICD-10-CM | POA: Diagnosis not present

## 2021-02-17 DIAGNOSIS — M9903 Segmental and somatic dysfunction of lumbar region: Secondary | ICD-10-CM | POA: Diagnosis not present

## 2021-02-17 DIAGNOSIS — M7918 Myalgia, other site: Secondary | ICD-10-CM | POA: Diagnosis not present

## 2021-02-17 DIAGNOSIS — M5416 Radiculopathy, lumbar region: Secondary | ICD-10-CM | POA: Diagnosis not present

## 2021-02-17 DIAGNOSIS — M5136 Other intervertebral disc degeneration, lumbar region: Secondary | ICD-10-CM | POA: Diagnosis not present

## 2021-02-18 DIAGNOSIS — H2512 Age-related nuclear cataract, left eye: Secondary | ICD-10-CM | POA: Diagnosis not present

## 2021-02-18 DIAGNOSIS — Z961 Presence of intraocular lens: Secondary | ICD-10-CM | POA: Diagnosis not present

## 2021-02-23 ENCOUNTER — Ambulatory Visit (INDEPENDENT_AMBULATORY_CARE_PROVIDER_SITE_OTHER): Payer: Medicare Other

## 2021-02-23 DIAGNOSIS — N1831 Chronic kidney disease, stage 3a: Secondary | ICD-10-CM

## 2021-02-23 DIAGNOSIS — E1122 Type 2 diabetes mellitus with diabetic chronic kidney disease: Secondary | ICD-10-CM

## 2021-02-23 NOTE — Patient Instructions (Addendum)
Thank you for allowing the Chronic Care Management team to participate in your care.    Patient Care Plan: Diabetes Type 2 (Adult)     Problem Identified: Disease Progression (Diabetes, Type 2)      Long-Range Goal: Disease Progression Prevented or Minimized   Start Date: 02/23/2021  Expected End Date: 05/24/2021  Priority: High  Note:   Objective:  Lab Results  Component Value Date   HGBA1C 9.0 (A) 11/05/2020    Lab Results  Component Value Date   HGBA1C 7.8 (A) 02/12/2021     Current Barriers:  Chronic Disease Management support and educational needs r/t Diabetes self-management.   Case Manager Clinical Goal(s):  Over the next 90 days, patient will demonstrate improved adherence to prescribed treatment plan for Diabetes self-management as evidenced by taking medications as prescribed, daily monitoring and recording of CBG and adherence to ADA/ carb modified diet  Interventions:  Collaboration with Jerrol Banana., MD regarding development and update of comprehensive plan of care as evidenced by provider attestation and co-signature Inter-disciplinary care team collaboration (see longitudinal plan of care) Reviewed compliance with recommended treatment plan. Reports excellent compliance with medications. Continues to monitor blood glucose readings as advised. Reports readings have been within range. Fasting reading was 131 mg/dl today.Reports doing very well with monitoring her intake and adhering to a carb modified/diabetic diet. Her recent A1C improved. Decreased from 9 to 7.8% She remains motivated to achieve optimal glycemic control.    Patient Goals/Self-Care Activities Self-administer medications as prescribed Attend all scheduled provider appointments Monitor blood glucose levels consistently and utilize recommended interventions Adhere to prescribed ADA/carb modified Notify provider or care management team with questions and new concerns as needed   Follow  Up Plan:  Will follow up in three months         Mrs. Pennie verbalized understanding of the information discussed during the telephonic outreach. Declined need for mailed/printed instructions. A member of the care management team will follow up in three months.   [name] [facility] Care Management [facility] [phone]

## 2021-02-23 NOTE — Chronic Care Management (AMB) (Signed)
Chronic Care Management   CCM RN Visit Note  02/23/2021 Name: Carol Ramsey MRN: [id number] DOB: [birth year]  Subjective: Carol Ramsey is a 75 y.o. year old female who is a primary care patient of Jerrol Banana., MD. The care management team was consulted for assistance with disease management and care coordination needs.    Engaged with patient by telephone for follow up visit in response to provider referral for case management and care coordination services.   Consent to Services:  The patient was given information about Chronic Care Management services, agreed to services, and gave verbal consent prior to initiation of services.  Please see initial visit note for detailed documentation.    Assessment: Review of patient past medical history, allergies, medications, health status, including review of consultants reports, laboratory and other test data, was performed as part of comprehensive evaluation and provision of chronic care management services.   SDOH (Social Determinants of Health) assessments and interventions performed: No  CCM Care Plan  Allergies  Allergen Reactions   Gabapentin Swelling   Quinine Nausea Only and Nausea And Vomiting   Amoxicillin Itching and Rash    Has patient had a PCN reaction causing immediate rash, facial/tongue/throat swelling, SOB or lightheadedness with hypotension: No Has patient had a PCN reaction causing severe rash involving mucus membranes or skin necrosis: No Has patient had a PCN reaction that required hospitalization No Has patient had a PCN reaction occurring within the last 10 years: No If all of the above answers are "NO", then may proceed with Cephalosporin use.    Dilaudid  [Hydromorphone Hcl] Rash   Etodolac Rash   Hydrochlorothiazide Rash   Hydrocodone Rash   Hydromorphone Rash   Sulfa Antibiotics Rash    Outpatient Encounter Medications as of 02/23/2021  Medication Sig Note   acetaminophen (TYLENOL) 500 MG  tablet Take 500 mg by mouth every 6 (six) hours as needed for mild pain or moderate pain.    ascorbic acid (VITAMIN C) 500 MG tablet Take 500 mg by mouth daily.    atorvastatin (LIPITOR) 10 MG tablet Take 10 mg by mouth daily at 6 PM.     Cholecalciferol (VITAMIN D) 125 MCG (5000 UT) CAPS Take 5,000 Units by mouth daily.    Cyanocobalamin (VITAMIN B-12) 5000 MCG SUBL Place 5,000 mcg under the tongue daily.    dapagliflozin propanediol (FARXIGA) 10 MG TABS tablet Take 1 tablet (10 mg total) by mouth daily.    furosemide (LASIX) 20 MG tablet TAKE 1 TABLET BY MOUTH EVERY DAY 04/02/2020: Reports taking as needed for edema   glipiZIDE (GLUCOTROL XL) 5 MG 24 hr tablet TAKE 1 TABLET BY MOUTH DAILY    glucose blood (ONETOUCH VERIO) test strip Use as instructed; Check blood sugar twice daily    [name] 50-1000 MG tablet TAKE 1 TABLET BY MOUTH TWICE DAILY WITH A MEAL    lisinopril-hydrochlorothiazide (ZESTORETIC) 20-12.5 MG tablet TAKE 2 TABLETS BY MOUTH EVERY MORNING    Magnesium 500 MG CAPS Take 1 capsule (500 mg total) by mouth at bedtime.    metoprolol succinate (TOPROL-XL) 100 MG 24 hr tablet TAKE 1 TABLET BY MOUTH DAILY WITH OR IMMEDIATELY FOLLOWING A MEAL    tamoxifen (NOLVADEX) 20 MG tablet TAKE 1 TABLET(20 MG) BY MOUTH DAILY    No facility-administered encounter medications on file as of 02/23/2021.    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) 02/28/2018   Lumbar facet arthropathy (Bilateral) 02/28/2018   Lumbar intervertebral disc  protrusion 02/28/2018   Lumbar nerve root compression 02/28/2018   Morbid obesity with BMI of 40.0-44.9, adult ([place]) 02/27/2018   Hypoalbuminemia 02/27/2018   Hypomagnesemia 02/27/2018   Vitamin B 12 deficiency 02/27/2018   Low serum vitamin B12 02/06/2018   Morbid obesity ([place]) 02/01/2018   Chronic low back pain (Primary Area of Pain) (Bilateral) w/ sciatica (Left) 02/01/2018   Chronic lower extremity pain (Secondary Area of Pain) (Left) 02/01/2018   Chronic pain syndrome 02/01/2018   Pharmacologic therapy 02/01/2018   Disorder of skeletal system 02/01/2018   Problems influencing health status 02/01/2018   Chronic sacroiliac joint pain (Left) 02/01/2018   Bilateral leg edema 02/21/2017   CKD (chronic kidney disease) stage 3, GFR 30-59 ml/min ([name]) 07/21/2016   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   LVH (left ventricular hypertrophy) due to hypertensive disease, without heart failure 01/19/2016   Pulmonary hypertension ([place]) 02/10/2015   Acquired hypothyroidism 09/17/2014   Adaptation reaction 09/17/2014   Absolute anemia 09/17/2014   Cardiac dysrhythmia 09/17/2014   Lumbar facet hypertrophy 09/17/2014   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 09/17/2014   Diabetes mellitus type 2, uncomplicated ([place]) 47/82/9562   Essential (primary) hypertension 09/17/2014   HLD (hyperlipidemia) 09/17/2014   Extreme obesity 09/17/2014   Vitamin D insufficiency 09/17/2014   Beat, premature ventricular 12/11/2013   OBESITY 11/14/2007   Achilles bursitis or tendinitis 11/14/2007   Calcaneal bursitis (heel), unspecified laterality 06/26/2007    Conditions to be addressed/monitored:DMII Patient Care Plan: Diabetes Type 2 (Adult)     Problem Identified: Disease Progression (Diabetes, Type 2)      Long-Range Goal: Disease Progression Prevented or Minimized   Start Date: 02/23/2021  Expected End Date: 05/24/2021  Priority: High  Note:   Objective:  Lab Results   Component Value Date   HGBA1C 9.0 (A) 11/05/2020    Lab Results  Component Value Date   HGBA1C 7.8 (A) 02/12/2021     Current Barriers:  Chronic Disease Management support and educational needs r/t Diabetes self-management.   Case Manager Clinical Goal(s):  Over the next 90 days, patient will demonstrate improved adherence to prescribed treatment plan for Diabetes self-management as evidenced by taking medications as prescribed, daily monitoring and recording of CBG and adherence to ADA/ carb modified diet  Interventions:  Collaboration with Jerrol Banana., MD regarding development and update of comprehensive plan of care as evidenced by provider attestation and co-signature Inter-disciplinary care team collaboration (see longitudinal plan of care) Reviewed compliance with recommended treatment plan. Reports excellent compliance with medications. Continues to monitor blood glucose readings as advised. Reports readings have been within range. Fasting reading was 131 mg/dl today.Reports doing very well with monitoring her intake and adhering to a carb modified/diabetic diet. Her recent A1C improved. Decreased from 9 to 7.8% She remains motivated to achieve optimal glycemic control.    Patient Goals/Self-Care Activities Self-administer medications as prescribed Attend all scheduled provider appointments Monitor blood glucose levels consistently and utilize recommended interventions Adhere to prescribed ADA/carb modified Notify provider or care management team with  questions and new concerns as needed   Follow Up Plan:  Will follow up in three months        PLAN A member of the care management team will follow up with Mrs. Rajewski within the next three months.   [name] Care Management [facility] [phone]

## 2021-03-01 DIAGNOSIS — N1831 Chronic kidney disease, stage 3a: Secondary | ICD-10-CM

## 2021-03-01 DIAGNOSIS — E1122 Type 2 diabetes mellitus with diabetic chronic kidney disease: Secondary | ICD-10-CM | POA: Diagnosis not present

## 2021-03-04 ENCOUNTER — Telehealth: Payer: Self-pay

## 2021-03-04 NOTE — Progress Notes (Signed)
    Chronic Care Management Pharmacy Assistant   Name: Carol Ramsey  MRN: [id number] DOB: 07-09-1945  Patient called to be reminded of her telephone appointment with [name], CPP on 03/05/2021 @ 1000  No answer, left message of appointment date, time and type of appointment (either telephone or in person). Left message to have all medications, supplements, blood pressure and/or blood sugar logs available during appointment and to return call if need to reschedule.  Star Rating Drug: Atorvastatin 10 mg last filled on 02/28/2021 for a 90-Day supply with [facility] Lisinopril-HZTC 20-12.5 mg last filled on 12/04/2020 for a 90-Day supply with [facility] Glipizide 5 mg last filled on 01/27/2021 for a 90-Day supply with [facility] [name] last filled on 02/01/2021 for a 90-Day supply with [facility] [name] 50-1000 mg Patient on PAP with [name] for this medication renewal application needs to be sent in prior to 07/15/2021. Patient will get her last 90-Day refill in December.  Any gaps in medications fill history? No  Care Gaps: Zoster Vaccines Diabetic Foot Exam COVID-19 Vaccine  [name], CPA/CMA Clinical Pharmacist Assistant Phone: [phone]

## 2021-03-05 ENCOUNTER — Ambulatory Visit (INDEPENDENT_AMBULATORY_CARE_PROVIDER_SITE_OTHER): Payer: Medicare Other

## 2021-03-05 DIAGNOSIS — I10 Essential (primary) hypertension: Secondary | ICD-10-CM

## 2021-03-05 DIAGNOSIS — E1122 Type 2 diabetes mellitus with diabetic chronic kidney disease: Secondary | ICD-10-CM

## 2021-03-05 DIAGNOSIS — I872 Venous insufficiency (chronic) (peripheral): Secondary | ICD-10-CM

## 2021-03-05 DIAGNOSIS — N1831 Chronic kidney disease, stage 3a: Secondary | ICD-10-CM

## 2021-03-05 MED ORDER — FUROSEMIDE 20 MG PO TABS: 20.0000 mg | ORAL_TABLET | Freq: Every day | ORAL | 3 refills | Status: DC | PRN

## 2021-03-05 MED ORDER — DAPAGLIFLOZIN PROPANEDIOL 10 MG PO TABS: 10.0000 mg | ORAL_TABLET | Freq: Every day | ORAL | 3 refills | Status: DC

## 2021-03-05 MED ORDER — JANUMET 50-1000 MG PO TABS
1.0000 | ORAL_TABLET | Freq: Two times a day (BID) | ORAL | 0 refills | Status: DC
Start: 2021-03-05 — End: 2021-12-17

## 2021-03-05 MED ORDER — LISINOPRIL-HYDROCHLOROTHIAZIDE 20-12.5 MG PO TABS: 1.0000 | ORAL_TABLET | Freq: Every morning | ORAL | 1 refills | Status: DC

## 2021-03-05 NOTE — Patient Instructions (Addendum)
Visit Information It was great speaking with you today!  Please let me know if you have any questions about our visit.   Goals Addressed             This Visit's Progress    Monitor and Manage My Blood Sugar-Diabetes Type 2       Timeframe:  Long-Range Goal Priority:  High Start Date:  08/03/2020                           Expected End Date: 02/02/2022                      Follow Up within 90 days   - check blood sugar at prescribed times - check blood sugar if I feel it is too high or too low - enter blood sugar readings and medication or insulin into daily log    Why is this important?   Checking your blood sugar at home helps to keep it from getting very high or very low.  Writing the results in a diary or log helps the doctor know how to care for you.  Your blood sugar log should have the time, date and the results.  Also, write down the amount of insulin or other medicine that you take.  Other information, like what you ate, exercise done and how you were feeling, will also be helpful.     Notes:         Patient Care Plan: Diabetes Type 2 (Adult)     Problem Identified: Disease Progression (Diabetes, Type 2)      Long-Range Goal: Disease Progression Prevented or Minimized   Start Date: 02/23/2021  Expected End Date: 05/24/2021  Priority: High  Note:   Objective:  Lab Results  Component Value Date   HGBA1C 9.0 (A) 11/05/2020    Lab Results  Component Value Date   HGBA1C 7.8 (A) 02/12/2021     Current Barriers:  Chronic Disease Management support and educational needs r/t Diabetes self-management.   Case Manager Clinical Goal(s):  Over the next 90 days, patient will demonstrate improved adherence to prescribed treatment plan for Diabetes self-management as evidenced by taking medications as prescribed, daily monitoring and recording of CBG and adherence to ADA/ carb modified diet  Interventions:  Collaboration with Jerrol Banana., MD regarding  development and update of comprehensive plan of care as evidenced by provider attestation and co-signature Inter-disciplinary care team collaboration (see longitudinal plan of care) Reviewed compliance with recommended treatment plan. Reports excellent compliance with medications. Continues to monitor blood glucose readings as advised. Reports readings have been within range. Fasting reading was 131 mg/dl today.Reports doing very well with monitoring her intake and adhering to a carb modified/diabetic diet. Her recent A1C improved. Decreased from 9 to 7.8% She remains motivated to achieve optimal glycemic control.    Patient Goals/Self-Care Activities Self-administer medications as prescribed Attend all scheduled provider appointments Monitor blood glucose levels consistently and utilize recommended interventions Adhere to prescribed ADA/carb modified Notify provider or care management team with questions and new concerns as needed   Follow Up Plan:  Will follow up in three months       Patient Care Plan: Hypertension and Hyperlipidemia  Completed 02/23/2021   Problem Identified: Hypertension and Hyperlipidemia Resolved 02/23/2021     Long-Range Goal: Hypertension and Hyperlipidemia Monitored Completed 11/10/2020  Start Date: 07/13/2020  Expected End Date: 11/10/2020  Priority: High  Note:    Current Barriers:  Chronic Disease Management support and educational needs r/t Hypertension and Hyperlipidemia.  Case Manager Clinical Goal(s):  Over the next 120 days, patient will demonstrate improved adherence to prescribed treatment plan as evidenced by taking all medications as prescribed, monitoring, and recording blood pressure and adhering to a cardiac prudent/heart healthy diet.  Interventions:  Collaboration with Jerrol Banana., MD regarding development and update of comprehensive plan of care as evidenced by provider attestation and co-signature Inter-disciplinary care  team collaboration (see longitudinal plan of care) Reviewed medications and treatment plan. Reports excellent medication compliance. Reports improvements with hypertension management and fluid retention. Required lasix once within the last two weeks. She occasionally notes edema to her ankles but reports it has significantly improved. Doing very well with monitoring sodium intake and maintaining a heart healthy diet. Denies need for additional resources r/t hypertension or hyperlipidemia.    Patient Goals/Self-Care Activities: -Self-administer medications as prescribed -Attend all scheduled provider appointments -Monitor and record blood pressure -Adhere to recommended cardiac prudent/heart healthy diet -Notify provider or care management team with questions and new concerns as needed   Goal Met    Patient Care Plan: General Pharmacy (Adult)     Problem Identified: Hypertension, Hyperlipidemia, Diabetes, Chronic Kidney Disease, Hypothyroidism and Chronic Pain and History of Breast Cancer   Priority: High     Long-Range Goal: Patient-Specific Goal   Start Date: 08/03/2020  Expected End Date: 05/12/2021  This Visit's Progress: On track  Recent Progress: On track  Priority: High  Note:   Current Barriers:  Unable to independently afford treatment regimen Unable to achieve control of diabetes   Pharmacist Clinical Goal(s):  Patient will verbalize ability to afford treatment regimen achieve control of diabetes as evidenced by A1c less than 8% through collaboration with PharmD and provider.   Interventions: 1:1 collaboration with Jerrol Banana., MD regarding development and update of comprehensive plan of care as evidenced by provider attestation and co-signature Inter-disciplinary care team collaboration (see longitudinal plan of care) Comprehensive medication review performed; medication list updated in electronic medical record  Hypertension (BP goal <140/90) -Not  ideally controlled -Current treatment: Furosemide 20 mg daily  Lisinopril-HCTZ 20-12.5 mg 2 tablets daily  Metoprolol XL 100 mg daily  -Medications previously tried: NA  -Current home readings:  118/58  133/46  107/42 -Denies hypotensive/hypertensive symptoms -DECREASE Lisinopril-HCTZ to 20-12.5 mg daily   Hyperlipidemia: (LDL goal < 100) -Controlled -Current treatment: Atorvastatin 10 mg daily  -Medications previously tried: NA  -Current dietary patterns: Trying to eat more vegetables, glucerna shakes, eating less overall.  -Current exercise habits: Physical Therapy exercises 3 days  -Educated on Importance of limiting foods high in cholesterol; Exercise goal of 150 minutes per week; -Recommended to continue current medication  Diabetes (A1c goal <8%) -Controlled -Current medications: Farxiga 10 mg daily Glipizide XL 5 mg daily   [name] 50-1000 mg twice daily -Medications previously tried: NA  -Current home glucose readings fasting glucose: 112,113,91, 131. One instance 141  -Denies hypoglycemic/hyperglycemic symptoms -Limiting low-carbohydrate, sugar free fruit. 2-3 meals. Drinks mainly water, occasional diet pepsi if eating out.  -Educated on Benefits of routine self-monitoring of blood sugar; -Continue current medications  Patient Goals/Self-Care Activities Patient will:  - check glucose daily before breakfast, document, and provide at future appointments check blood pressure weekly, document, and provide at future appointments target a minimum of 150 minutes of moderate intensity exercise weekly  Follow Up Plan: Telephone follow up appointment with  care management team member scheduled for:  04/12/2021 at 11:00 AM    Patient agreed to services and verbal consent obtained.   Patient verbalizes understanding of instructions provided today and agrees to view in [place].   [name], PharmD, [facility] [place], [place] [phone]

## 2021-03-05 NOTE — Progress Notes (Signed)
Chronic Care Management Pharmacy Note  03/05/2021 Name:  Whitni L Kamaka MRN:  [id number] DOB:  [birth year]  Summary: Patient presents for CCM follow-up. She continues to experience low blood pressure readings although she denies hypotensive symptoms. Her only car is being fixed and she is without transportation for the next 2 weeks.   Recommendations/Changes made from today's visit: DECREASE Lisinopril-HCTZ to 20-12.5 mg daily  Refills of furosemide, Farxiga sent in.   Plan: CPP follow-up 1 month   Subjective: Margrete L Dibbern is an 75 y.o. year old female who is a primary patient of Jerrol Banana., MD.  The CCM team was consulted for assistance with disease management and care coordination needs.    Engaged with patient by telephone for follow up visit in response to provider referral for pharmacy case management and/or care coordination services.   Consent to Services:  The patient was given information about Chronic Care Management services, agreed to services, and gave verbal consent prior to initiation of services.  Please see initial visit note for detailed documentation.   Patient Care Team: Jerrol Banana., MD as PCP - General (Family Medicine) Marry Guan, Laurice Record, MD as Consulting Physician (Orthopedic Surgery) Dasher, Rayvon Char, MD as Consulting Physician (Dermatology) Corey Skains, MD as Consulting Physician (Cardiology) Thelma Comp, [place] as Consulting Physician (Optometry) Byrnett, Forest Gleason, MD (General Surgery) Noreene Filbert, MD as Referring Physician (Radiation Oncology) Lloyd Huger, MD as Consulting Physician (Oncology) Neldon Labella, RN as Case Manager Germaine Pomfret, [facility] (Pharmacist)  Recent office visits: 02/04/21: Patient presented to Dr. Rosanna Randy for follow-up.A1c improved to 7.8%.  07/22/20: Patient presented to Dr. Rosanna Randy for follow-up. Sugars improved on Farxiga  06/24/20: Patient presented to Dr. Rosanna Randy for follow-up. A1c  worsened to 11.1%. Patient started on Farxiga 10 mg daily. Patient provided with samples.   Recent consult visits: 04/21/20: Video visit with Dr. Grayland Ormond (Oncology) for breast cancer follow-up. Patient stable, asymptomatic. No medication changes made.  12/10/19: Patient presented to Dr. Nehemiah Massed (Cardiology) for follow-up.   Hospital visits: None in previous 6 months  Objective:  Lab Results  Component Value Date   CREATININE 1.20 (H) 02/19/2020   BUN 19 02/19/2020   GFRNONAA 45 (L) 02/19/2020   GFRAA 52 (L) 02/19/2020   NA 135 02/19/2020   K 4.5 02/19/2020   CALCIUM 8.9 02/19/2020   CO2 21 02/19/2020   GLUCOSE 170 (H) 02/19/2020    Lab Results  Component Value Date/Time   HGBA1C 7.8 (A) 02/12/2021 11:34 AM   HGBA1C 9.0 (A) 11/05/2020 11:20 AM   HGBA1C 7.6 (H) 02/19/2020 10:56 AM   HGBA1C 9.5 (H) 05/30/2019 10:13 AM    Last diabetic Eye exam:  Lab Results  Component Value Date/Time   HMDIABEYEEXA Retinopathy (A) 12/31/2020 12:00 AM    Last diabetic Foot exam: No results found for: HMDIABFOOTEX   Lab Results  Component Value Date   CHOL 121 02/19/2020   HDL 61 02/19/2020   LDLCALC 42 02/19/2020   TRIG 97 02/19/2020   CHOLHDL 2.0 02/19/2020    Hepatic Function Latest Ref Rng & Units 02/19/2020 05/30/2019 01/29/2019  Total Protein 6.0 - 8.5 g/dL 6.6 - 6.2  Albumin 3.7 - 4.7 g/dL 3.9 3.7 3.6(L)  AST 0 - 40 IU/L 15 - 15  ALT 0 - 32 IU/L 8 - -  Alk Phosphatase 44 - 121 IU/L 53 - 30(L)  Total Bilirubin 0.0 - 1.2 mg/dL 0.5 - 0.5    Lab Results  Component Value Date/Time   TSH 4.800 (H) 02/19/2020 10:56 AM   TSH 3.360 01/29/2019 11:56 AM    CBC Latest Ref Rng & Units 02/19/2020 05/30/2019 01/29/2019  WBC 3.4 - 10.8 x10E3/uL 9.2 7.7 6.5  Hemoglobin 11.1 - 15.9 g/dL 11.7 10.8(L) 10.4(L)  Hematocrit 34.0 - 46.6 % 35.5 33.8(L) 33.7(L)  Platelets 150 - 450 x10E3/uL 200 194 199    No results found for: VD25OH  Clinical ASCVD: No  The ASCVD Risk score (Arnett DK,  et al., 2019) failed to calculate for the following reasons:   The valid total cholesterol range is 130 to 320 mg/dL    Depression screen [facility] 2/9 06/01/2020 04/02/2020 02/19/2020  Decreased Interest 0 0 0  Down, Depressed, Hopeless 0 0 0  PHQ - 2 Score 0 0 0  Altered sleeping - - 0  Tired, decreased energy - - 0  Change in appetite - - 0  Feeling bad or failure about yourself  - - 0  Trouble concentrating - - 0  Moving slowly or fidgety/restless - - 0  Suicidal thoughts - - 0  PHQ-9 Score - - 0  Difficult doing work/chores - - Not difficult at all  Some recent data might be hidden    Social History   Tobacco Use  Smoking Status Never  Smokeless Tobacco Never   BP Readings from Last 3 Encounters:  02/04/21 (!) 133/46  12/28/20 (!) 125/94  11/05/20 (!) 118/54   Pulse Readings from Last 3 Encounters:  02/04/21 (!) 53  12/28/20 66  11/05/20 (!) 52   Wt Readings from Last 3 Encounters:  02/04/21 198 lb (89.8 kg)  12/28/20 203 lb 9.6 oz (92.4 kg)  11/05/20 207 lb (93.9 kg)   BMI Readings from Last 3 Encounters:  02/04/21 35.07 kg/m  12/28/20 36.07 kg/m  11/05/20 36.67 kg/m    Assessment/Interventions: Review of patient past medical history, allergies, medications, health status, including review of consultants reports, laboratory and other test data, was performed as part of comprehensive evaluation and provision of chronic care management services.   SDOH:  (Social Determinants of Health) assessments and interventions performed: Yes SDOH Interventions    Flowsheet Row Most Recent Value  SDOH Interventions   Financial Strain Interventions Other (Comment)       SDOH Screenings   Alcohol Screen: Low Risk    Last Alcohol Screening Score (AUDIT): 0  Depression (PHQ2-9): Low Risk    PHQ-2 Score: 0  Financial Resource Strain: Low Risk    Difficulty of Paying Living Expenses: Not hard at all  Food Insecurity: No Food Insecurity   Worried About Charity fundraiser  in the Last Year: Never true   Ran Out of Food in the Last Year: Never true  Housing: Low Risk    Last Housing Risk Score: 0  Physical Activity: Inactive   Days of Exercise per Week: 0 days   Minutes of Exercise per Session: 0 min  Social Connections: Moderately Isolated   Frequency of Communication with Friends and Family: Never   Frequency of Social Gatherings with Friends and Family: More than three times a week   Attends Religious Services: Never   Marine scientist or Organizations: No   Attends Music therapist: Never   Marital Status: Married  Stress: No Stress Concern Present   Feeling of Stress : Not at all  Tobacco Use: Low Risk    Smoking Tobacco Use: Never   Smokeless Tobacco Use: Never  Passive Exposure: Not on file  Transportation Needs: No Transportation Needs   Lack of Transportation (Medical): No   Lack of Transportation (Non-Medical): No    CCM Care Plan  Allergies  Allergen Reactions   Gabapentin Swelling   Quinine Nausea Only and Nausea And Vomiting   Amoxicillin Itching and Rash    Has patient had a PCN reaction causing immediate rash, facial/tongue/throat swelling, SOB or lightheadedness with hypotension: No Has patient had a PCN reaction causing severe rash involving mucus membranes or skin necrosis: No Has patient had a PCN reaction that required hospitalization No Has patient had a PCN reaction occurring within the last 10 years: No If all of the above answers are "NO", then may proceed with Cephalosporin use.    Dilaudid  [Hydromorphone Hcl] Rash   Etodolac Rash   Hydrochlorothiazide Rash   Hydrocodone Rash   Hydromorphone Rash   Sulfa Antibiotics Rash    Medications Reviewed Today     Reviewed by Neldon Labella, RN (Registered Nurse) on 01/19/21 at [place] List Status: <None>   Medication Order Taking? Sig Documenting Provider Last Dose Status Informant  acetaminophen (TYLENOL) 500 MG tablet [id number] No Take 500 mg  by mouth every 6 (six) hours as needed for mild pain or moderate pain. [provider] Taking Active Self  ascorbic acid (VITAMIN C) 500 MG tablet [id number] No Take 500 mg by mouth daily. [provider] Taking Active   atorvastatin (LIPITOR) 10 MG tablet [id number] No Take 10 mg by mouth daily at 6 PM.  [provider] Taking Active Self           Med Note Michaelle Birks, Taequan Stockhausen A   Tue Apr 28, 2020  1:09 PM)    Cholecalciferol (VITAMIN D) 125 MCG (5000 UT) CAPS [id number] No Take 5,000 Units by mouth daily. [provider] Taking Active Self           Med Note Michaelle Birks, Cathe Mons A   Tue Apr 28, 2020  1:10 PM)    Cyanocobalamin (VITAMIN B-12) 5000 MCG SUBL [id number] No Place 5,000 mcg under the tongue daily. [provider] Taking Active   dapagliflozin propanediol (FARXIGA) 10 MG TABS tablet [id number] No Take 1 tablet (10 mg total) by mouth daily. Jerrol Banana., MD Taking Active   furosemide (LASIX) 20 MG tablet [id number] No TAKE 1 TABLET BY MOUTH EVERY DAY Chrismon, Vickki Muff, PA-C Taking Active            Med Note Minerva Ends, FELECIA N   Thu Apr 02, 2020 11:08 AM) Reports taking as needed for edema  glipiZIDE (GLUCOTROL XL) 5 MG 24 hr tablet [id number] No TAKE 1 TABLET BY MOUTH DAILY Jerrol Banana., MD Taking Active   glucose blood [facility] VERIO) test strip [id number] No Use as instructed; Check blood sugar twice daily Jerrol Banana., MD Taking Active   [name] 50-1000 MG tablet [id number] No TAKE 1 TABLET BY MOUTH TWICE DAILY WITH A MEAL Jerrol Banana., MD Taking Active   lisinopril-hydrochlorothiazide (ZESTORETIC) 20-12.5 MG tablet [id number] No TAKE 2 TABLETS BY MOUTH EVERY MORNING Jerrol Banana., MD Taking Active   Magnesium 500 MG CAPS [id number] No Take 1 capsule (500 mg total) by mouth at bedtime. Milinda Pointer, MD Taking Expired 12/28/20 2359   metoprolol succinate (TOPROL-XL) 100 MG 24 hr tablet [id number] No  TAKE 1 TABLET BY MOUTH DAILY WITH OR IMMEDIATELY FOLLOWING A MEAL Jerrol Banana., MD  Taking Active   tamoxifen (NOLVADEX) 20 MG tablet [id number] No TAKE 1 TABLET(20 MG) BY MOUTH DAILY Grayland Ormond, Kathlene November, MD Taking Active   Med List Note Hart Rochester, RN 02/01/18 1118): UDS 02-01-2018   New patient            Patient Active Problem List   Diagnosis Date Noted   OA (osteoarthritis) of knee 10/30/2018   Lymphedema 10/30/2018   DDD (degenerative disc disease), lumbar 02/28/2018   Osteoarthritis of sacroiliac joint (Left) 02/28/2018   Somatic dysfunction of sacroiliac joint (Left) 02/28/2018   Sacroiliac joint dysfunction (Left) 02/28/2018   Abnormal MRI, lumbar spine (07/22/2013) 02/28/2018   Lumbar facet syndrome (Bilateral) 02/28/2018   Lumbar spondylosis 02/28/2018   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) 02/28/2018   Lumbar facet arthropathy (Bilateral) 02/28/2018   Lumbar intervertebral disc protrusion 02/28/2018   Lumbar nerve root compression 02/28/2018   Morbid obesity with BMI of 40.0-44.9, adult ([place]) 02/27/2018   Hypoalbuminemia 02/27/2018   Hypomagnesemia 02/27/2018   Vitamin B 12 deficiency 02/27/2018   Low serum vitamin B12 02/06/2018   Morbid obesity ([place]) 02/01/2018   Chronic low back pain (Primary Area of Pain) (Bilateral) w/ sciatica (Left) 02/01/2018   Chronic lower extremity pain (Secondary Area of Pain) (Left) 02/01/2018   Chronic pain syndrome 02/01/2018   Pharmacologic therapy 02/01/2018   Disorder of skeletal system 02/01/2018   Problems influencing health status 02/01/2018   Chronic sacroiliac joint pain (Left) 02/01/2018   Bilateral leg edema 02/21/2017   CKD (chronic kidney disease) stage 3, GFR 30-59 ml/min ([name]) 07/21/2016   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   LVH (left ventricular hypertrophy) due to hypertensive disease, without heart failure 01/19/2016   Pulmonary hypertension ([place]) 02/10/2015   Acquired  hypothyroidism 09/17/2014   Adaptation reaction 09/17/2014   Absolute anemia 09/17/2014   Cardiac dysrhythmia 09/17/2014   Lumbar facet hypertrophy 09/17/2014   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 09/17/2014   Diabetes mellitus type 2, uncomplicated ([place]) 27/74/1287   Essential (primary) hypertension 09/17/2014   HLD (hyperlipidemia) 09/17/2014   Extreme obesity 09/17/2014   Vitamin D insufficiency 09/17/2014   Beat, premature ventricular 12/11/2013   OBESITY 11/14/2007   Achilles bursitis or tendinitis 11/14/2007   Calcaneal bursitis (heel), unspecified laterality 06/26/2007    Immunization History  Administered Date(s) Administered   Fluad Quad(high Dose 65+) 01/29/2019, 01/30/2020   Influenza Split 02/25/2006, 03/07/2011, 02/27/2012   Influenza, High Dose Seasonal PF 03/03/2016, 01/26/2017, 01/08/2018   Influenza,inj,Quad PF,6+ Mos 03/04/2013   PFIZER(Purple Top)SARS-COV-2 Vaccination 06/11/2019, 07/02/2019, 01/31/2020   Pneumococcal Conjugate-13 04/13/2015   Pneumococcal Polysaccharide-23 02/17/2007, 02/27/2012   Td 10/21/2003   Tdap 05/11/2016   Zoster, Live 11/08/2010    Conditions to be addressed/monitored:  Hypertension, Hyperlipidemia, Diabetes, Chronic Kidney Disease, Hypothyroidism and Chronic Pain and History of Breast Cancer  Care Plan : General Pharmacy (Adult)  Updates made by Germaine Pomfret, RPH since 03/05/2021 12:00 AM     Problem: Hypertension, Hyperlipidemia, Diabetes, Chronic Kidney Disease, Hypothyroidism and Chronic Pain and History of Breast Cancer   Priority: High     Long-Range Goal: Patient-Specific Goal   Start Date: 08/03/2020  Expected End Date: 05/12/2021  This Visit's Progress: On track  Recent Progress: On track  Priority: High  Note:   Current Barriers:  Unable to independently afford treatment regimen Unable to achieve control of diabetes   Pharmacist Clinical Goal(s):  Patient will verbalize ability to afford treatment  regimen  achieve control of diabetes as evidenced by A1c less than 8% through collaboration with PharmD and provider.   Interventions: 1:1 collaboration with Jerrol Banana., MD regarding development and update of comprehensive plan of care as evidenced by provider attestation and co-signature Inter-disciplinary care team collaboration (see longitudinal plan of care) Comprehensive medication review performed; medication list updated in electronic medical record  Hypertension (BP goal <140/90) -Not ideally controlled -Current treatment: Furosemide 20 mg daily  Lisinopril-HCTZ 20-12.5 mg 2 tablets daily  Metoprolol XL 100 mg daily  -Medications previously tried: NA  -Current home readings:  118/58  133/46  107/42 -Denies hypotensive/hypertensive symptoms -DECREASE Lisinopril-HCTZ to 20-12.5 mg daily   Hyperlipidemia: (LDL goal < 100) -Controlled -Current treatment: Atorvastatin 10 mg daily  -Medications previously tried: NA  -Current dietary patterns: Trying to eat more vegetables, glucerna shakes, eating less overall.  -Current exercise habits: Physical Therapy exercises 3 days  -Educated on Importance of limiting foods high in cholesterol; Exercise goal of 150 minutes per week; -Recommended to continue current medication  Diabetes (A1c goal <8%) -Controlled -Current medications: Farxiga 10 mg daily Glipizide XL 5 mg daily   [name] 50-1000 mg twice daily -Medications previously tried: NA  -Current home glucose readings fasting glucose: 112,113,91, 131. One instance 141  -Denies hypoglycemic/hyperglycemic symptoms -Limiting low-carbohydrate, sugar free fruit. 2-3 meals. Drinks mainly water, occasional diet pepsi if eating out.  -Educated on Benefits of routine self-monitoring of blood sugar; -Continue current medications  Patient Goals/Self-Care Activities Patient will:  - check glucose daily before breakfast, document, and provide at future appointments check  blood pressure weekly, document, and provide at future appointments target a minimum of 150 minutes of moderate intensity exercise weekly  Follow Up Plan: Telephone follow up appointment with care management team member scheduled for:  04/12/2021 at 11:00 AM     Medication Assistance:  [name] obtained through [name] medication assistance program.  Enrollment ends Mar 2023 [name] obtained through [name] medication [place].  Renewal prescription sent in for 2023.   Patient's preferred pharmacy is:  [id number] [address] Heal, [place] AT [facility] OF [address] [place] [address] Phone: [phone] Fax: [place], [place] [address] [place] [address] Phone: [phone] Fax: [phone]  Uses pill box? Yes Pt endorses 80% compliance  We discussed: Current pharmacy is preferred with insurance plan and patient is satisfied with pharmacy services Patient decided to: Continue current medication management strategy  Care Plan and Follow Up Patient Decision:  Patient agrees to Care Plan and Follow-up.  Plan: Telephone follow up appointment with care management team member scheduled for:  04/12/2021 at 11:00 AM  [name], PharmD, [place] [phone]

## 2021-03-08 DIAGNOSIS — Z20828 Contact with and (suspected) exposure to other viral communicable diseases: Secondary | ICD-10-CM | POA: Diagnosis not present

## 2021-03-17 DIAGNOSIS — M9903 Segmental and somatic dysfunction of lumbar region: Secondary | ICD-10-CM | POA: Diagnosis not present

## 2021-03-17 DIAGNOSIS — M9905 Segmental and somatic dysfunction of pelvic region: Secondary | ICD-10-CM | POA: Diagnosis not present

## 2021-03-17 DIAGNOSIS — M25552 Pain in left hip: Secondary | ICD-10-CM | POA: Diagnosis not present

## 2021-03-17 DIAGNOSIS — M7918 Myalgia, other site: Secondary | ICD-10-CM | POA: Diagnosis not present

## 2021-03-17 DIAGNOSIS — M5136 Other intervertebral disc degeneration, lumbar region: Secondary | ICD-10-CM | POA: Diagnosis not present

## 2021-03-17 DIAGNOSIS — M5451 Vertebrogenic low back pain: Secondary | ICD-10-CM | POA: Diagnosis not present

## 2021-03-17 DIAGNOSIS — M5416 Radiculopathy, lumbar region: Secondary | ICD-10-CM | POA: Diagnosis not present

## 2021-03-18 DIAGNOSIS — D0439 Carcinoma in situ of skin of other parts of face: Secondary | ICD-10-CM | POA: Diagnosis not present

## 2021-03-18 DIAGNOSIS — L570 Actinic keratosis: Secondary | ICD-10-CM | POA: Diagnosis not present

## 2021-03-31 DIAGNOSIS — Z7984 Long term (current) use of oral hypoglycemic drugs: Secondary | ICD-10-CM | POA: Diagnosis not present

## 2021-03-31 DIAGNOSIS — E1159 Type 2 diabetes mellitus with other circulatory complications: Secondary | ICD-10-CM

## 2021-03-31 DIAGNOSIS — I10 Essential (primary) hypertension: Secondary | ICD-10-CM | POA: Diagnosis not present

## 2021-03-31 DIAGNOSIS — Z20822 Contact with and (suspected) exposure to covid-19: Secondary | ICD-10-CM | POA: Diagnosis not present

## 2021-04-01 DIAGNOSIS — M1711 Unilateral primary osteoarthritis, right knee: Secondary | ICD-10-CM | POA: Diagnosis not present

## 2021-04-01 DIAGNOSIS — U071 COVID-19: Secondary | ICD-10-CM

## 2021-04-01 HISTORY — DX: COVID-19: U07.1

## 2021-04-08 DIAGNOSIS — M1711 Unilateral primary osteoarthritis, right knee: Secondary | ICD-10-CM | POA: Diagnosis not present

## 2021-04-12 ENCOUNTER — Ambulatory Visit: Payer: Medicare Other

## 2021-04-12 DIAGNOSIS — I10 Essential (primary) hypertension: Secondary | ICD-10-CM

## 2021-04-12 DIAGNOSIS — E1122 Type 2 diabetes mellitus with diabetic chronic kidney disease: Secondary | ICD-10-CM

## 2021-04-12 NOTE — Patient Instructions (Signed)
Visit Information It was great speaking with you today!  Please let me know if you have any questions about our visit.   Goals Addressed             This Visit's Progress    Monitor and Manage My Blood Sugar-Diabetes Type 2   On track    Timeframe:  Long-Range Goal Priority:  High Start Date:  08/03/2020                           Expected End Date: 02/02/2022                      Follow Up within 90 days   - check blood sugar at prescribed times - check blood sugar if I feel it is too high or too low - enter blood sugar readings and medication or insulin into daily log    Why is this important?   Checking your blood sugar at home helps to keep it from getting very high or very low.  Writing the results in a diary or log helps the doctor know how to care for you.  Your blood sugar log should have the time, date and the results.  Also, write down the amount of insulin or other medicine that you take.  Other information, like what you ate, exercise done and how you were feeling, will also be helpful.     Notes:         Patient Care Plan: Diabetes Type 2 (Adult)     Problem Identified: Disease Progression (Diabetes, Type 2)      Long-Range Goal: Disease Progression Prevented or Minimized   Start Date: 02/23/2021  Expected End Date: 05/24/2021  Priority: High  Note:   Objective:  Lab Results  Component Value Date   HGBA1C 9.0 (A) 11/05/2020    Lab Results  Component Value Date   HGBA1C 7.8 (A) 02/12/2021     Current Barriers:  Chronic Disease Management support and educational needs r/t Diabetes self-management.   Case Manager Clinical Goal(s):  Over the next 90 days, patient will demonstrate improved adherence to prescribed treatment plan for Diabetes self-management as evidenced by taking medications as prescribed, daily monitoring and recording of CBG and adherence to ADA/ carb modified diet  Interventions:  Collaboration with Jerrol Banana., MD  regarding development and update of comprehensive plan of care as evidenced by provider attestation and co-signature Inter-disciplinary care team collaboration (see longitudinal plan of care) Reviewed compliance with recommended treatment plan. Reports excellent compliance with medications. Continues to monitor blood glucose readings as advised. Reports readings have been within range. Fasting reading was 131 mg/dl today.Reports doing very well with monitoring her intake and adhering to a carb modified/diabetic diet. Her recent A1C improved. Decreased from 9 to 7.8% She remains motivated to achieve optimal glycemic control.    Patient Goals/Self-Care Activities Self-administer medications as prescribed Attend all scheduled provider appointments Monitor blood glucose levels consistently and utilize recommended interventions Adhere to prescribed ADA/carb modified Notify provider or care management team with questions and new concerns as needed   Follow Up Plan:  Will follow up in three months       Patient Care Plan: Hypertension and Hyperlipidemia  Completed 02/23/2021   Problem Identified: Hypertension and Hyperlipidemia Resolved 02/23/2021     Long-Range Goal: Hypertension and Hyperlipidemia Monitored Completed 11/10/2020  Start Date: 07/13/2020  Expected End Date: 11/10/2020  Priority: High  Note:    Current Barriers:  Chronic Disease Management support and educational needs r/t Hypertension and Hyperlipidemia.  Case Manager Clinical Goal(s):  Over the next 120 days, patient will demonstrate improved adherence to prescribed treatment plan as evidenced by taking all medications as prescribed, monitoring, and recording blood pressure and adhering to a cardiac prudent/heart healthy diet.  Interventions:  Collaboration with Jerrol Banana., MD regarding development and update of comprehensive plan of care as evidenced by provider attestation and  co-signature Inter-disciplinary care team collaboration (see longitudinal plan of care) Reviewed medications and treatment plan. Reports excellent medication compliance. Reports improvements with hypertension management and fluid retention. Required lasix once within the last two weeks. She occasionally notes edema to her ankles but reports it has significantly improved. Doing very well with monitoring sodium intake and maintaining a heart healthy diet. Denies need for additional resources r/t hypertension or hyperlipidemia.    Patient Goals/Self-Care Activities: -Self-administer medications as prescribed -Attend all scheduled provider appointments -Monitor and record blood pressure -Adhere to recommended cardiac prudent/heart healthy diet -Notify provider or care management team with questions and new concerns as needed   Goal Met    Patient Care Plan: General Pharmacy (Adult)     Problem Identified: Hypertension, Hyperlipidemia, Diabetes, Chronic Kidney Disease, Hypothyroidism and Chronic Pain and History of Breast Cancer   Priority: High     Long-Range Goal: Patient-Specific Goal   Start Date: 08/03/2020  Expected End Date: 05/12/2021  This Visit's Progress: On track  Recent Progress: On track  Priority: High  Note:   Current Barriers:  Unable to independently afford treatment regimen Unable to achieve control of diabetes   Pharmacist Clinical Goal(s):  Patient will verbalize ability to afford treatment regimen achieve control of diabetes as evidenced by A1c less than 8% through collaboration with PharmD and provider.   Interventions: 1:1 collaboration with Jerrol Banana., MD regarding development and update of comprehensive plan of care as evidenced by provider attestation and co-signature Inter-disciplinary care team collaboration (see longitudinal plan of care) Comprehensive medication review performed; medication list updated in electronic medical  record  Hypertension (BP goal <140/90) -Not ideally controlled -Current treatment: Furosemide 20 mg daily  Lisinopril-HCTZ 20-12.5 mg daily  Metoprolol XL 100 mg daily  -Medications previously tried: NA  -Current home readings:  109/59 on 03/09/21  100/54 on 03/14/21 96/58 on 03/20/21 109/58 on 03/24/21 115/58 on 04/03/21  118/59 on 04/04/21  111/56 on 04/10/21 -Denies hypotensive/hypertensive symptoms -Continue current medications  Hyperlipidemia: (LDL goal < 100) -Controlled -Current treatment: Atorvastatin 10 mg daily  -Medications previously tried: NA  -Current dietary patterns: Trying to eat more vegetables, glucerna shakes, eating less overall.  -Current exercise habits: Physical Therapy exercises 3 days  -Educated on Importance of limiting foods high in cholesterol; Exercise goal of 150 minutes per week; -Recommended to continue current medication  Diabetes (A1c goal <8%) -Controlled -Current medications: Farxiga 10 mg daily Glipizide XL 5 mg daily   [name] 50-1000 mg twice daily -Medications previously tried: NA  -Current home glucose readings fasting glucose: 108,116,139, 139, 140,  -Denies hypoglycemic/hyperglycemic symptoms -Limiting low-carbohydrate 2-3 meals. Drinks mainly water, occasional diet pepsi if eating out.  -Educated on Benefits of routine self-monitoring of blood sugar; -Continue current medications  Patient Goals/Self-Care Activities Patient will:  - check glucose daily before breakfast, document, and provide at future appointments check blood pressure weekly, document, and provide at future appointments target a minimum of 150 minutes of moderate intensity exercise weekly  Follow Up Plan: Telephone follow up appointment with care management team member scheduled for:  07/09/2020 at 11:00 AM    Patient agreed to services and verbal consent obtained.   Patient verbalizes understanding of instructions provided today and agrees to view in  [place].   [name], PharmD, [place], CPP  Clinical Pharmacist Practitioner  [facility] [phone]

## 2021-04-12 NOTE — Progress Notes (Signed)
Chronic Care Management Pharmacy Note  04/12/2021 Name:  Carol Ramsey MRN:  [id number] DOB:  02-22-1946  Summary: Patient presents for CCM follow-up. Blood pressures and blood sugars are well controlled and patient reports she received shipment of her [name] through patient assistance.   Recommendations/Changes made from today's visit: -Continue current medications  Plan: CPP follow-up 3 months   Subjective: Carol Ramsey is an 75 y.o. year old female who is a primary patient of Jerrol Banana., MD.  The CCM team was consulted for assistance with disease management and care coordination needs.    Engaged with patient by telephone for follow up visit in response to provider referral for pharmacy case management and/or care coordination services.   Consent to Services:  The patient was given information about Chronic Care Management services, agreed to services, and gave verbal consent prior to initiation of services.  Please see initial visit note for detailed documentation.   Patient Care Team: Jerrol Banana., MD as PCP - General (Family Medicine) Marry Guan, Laurice Record, MD as Consulting Physician (Orthopedic Surgery) Dasher, Rayvon Char, MD as Consulting Physician (Dermatology) Corey Skains, MD as Consulting Physician (Cardiology) Thelma Comp, [place] as Consulting Physician (Optometry) Byrnett, Forest Gleason, MD (General Surgery) Noreene Filbert, MD as Referring Physician (Radiation Oncology) Lloyd Huger, MD as Consulting Physician (Oncology) Neldon Labella, RN as Case Manager Germaine Pomfret, [facility] (Pharmacist)  Recent office visits: 02/04/21: Patient presented to Dr. Rosanna Randy for follow-up.A1c improved to 7.8%.  07/22/20: Patient presented to Dr. Rosanna Randy for follow-up. Sugars improved on Farxiga  06/24/20: Patient presented to Dr. Rosanna Randy for follow-up. A1c worsened to 11.1%. Patient started on Farxiga 10 mg daily. Patient provided with samples.   Recent  consult visits: 04/21/20: Video visit with Dr. Grayland Ormond (Oncology) for breast cancer follow-up. Patient stable, asymptomatic. No medication changes made.  12/10/19: Patient presented to Dr. Nehemiah Massed (Cardiology) for follow-up.   Hospital visits: None in previous 6 months  Objective:  Lab Results  Component Value Date   CREATININE 1.20 (H) 02/19/2020   BUN 19 02/19/2020   GFRNONAA 45 (L) 02/19/2020   GFRAA 52 (L) 02/19/2020   NA 135 02/19/2020   K 4.5 02/19/2020   CALCIUM 8.9 02/19/2020   CO2 21 02/19/2020   GLUCOSE 170 (H) 02/19/2020    Lab Results  Component Value Date/Time   HGBA1C 7.8 (A) 02/12/2021 11:34 AM   HGBA1C 9.0 (A) 11/05/2020 11:20 AM   HGBA1C 7.6 (H) 02/19/2020 10:56 AM   HGBA1C 9.5 (H) 05/30/2019 10:13 AM    Last diabetic Eye exam:  Lab Results  Component Value Date/Time   HMDIABEYEEXA Retinopathy (A) 12/31/2020 12:00 AM    Last diabetic Foot exam: No results found for: HMDIABFOOTEX   Lab Results  Component Value Date   CHOL 121 02/19/2020   HDL 61 02/19/2020   LDLCALC 42 02/19/2020   TRIG 97 02/19/2020   CHOLHDL 2.0 02/19/2020    Hepatic Function Latest Ref Rng & Units 02/19/2020 05/30/2019 01/29/2019  Total Protein 6.0 - 8.5 g/dL 6.6 - 6.2  Albumin 3.7 - 4.7 g/dL 3.9 3.7 3.6(L)  AST 0 - 40 IU/L 15 - 15  ALT 0 - 32 IU/L 8 - -  Alk Phosphatase 44 - 121 IU/L 53 - 30(L)  Total Bilirubin 0.0 - 1.2 mg/dL 0.5 - 0.5    Lab Results  Component Value Date/Time   TSH 4.800 (H) 02/19/2020 10:56 AM   TSH 3.360 01/29/2019 11:56 AM  CBC Latest Ref Rng & Units 02/19/2020 05/30/2019 01/29/2019  WBC 3.4 - 10.8 x10E3/uL 9.2 7.7 6.5  Hemoglobin 11.1 - 15.9 g/dL 11.7 10.8(L) 10.4(L)  Hematocrit 34.0 - 46.6 % 35.5 33.8(L) 33.7(L)  Platelets 150 - 450 x10E3/uL 200 194 199    No results found for: VD25OH  Clinical ASCVD: No  The ASCVD Risk score (Arnett DK, et al., 2019) failed to calculate for the following reasons:   The valid total cholesterol range is  130 to 320 mg/dL    Depression screen [facility] 2/9 06/01/2020 04/02/2020 02/19/2020  Decreased Interest 0 0 0  Down, Depressed, Hopeless 0 0 0  PHQ - 2 Score 0 0 0  Altered sleeping - - 0  Tired, decreased energy - - 0  Change in appetite - - 0  Feeling bad or failure about yourself  - - 0  Trouble concentrating - - 0  Moving slowly or fidgety/restless - - 0  Suicidal thoughts - - 0  PHQ-9 Score - - 0  Difficult doing work/chores - - Not difficult at all  Some recent data might be hidden    Social History   Tobacco Use  Smoking Status Never  Smokeless Tobacco Never   BP Readings from Last 3 Encounters:  02/04/21 (!) 133/46  12/28/20 (!) 125/94  11/05/20 (!) 118/54   Pulse Readings from Last 3 Encounters:  02/04/21 (!) 53  12/28/20 66  11/05/20 (!) 52   Wt Readings from Last 3 Encounters:  02/04/21 198 lb (89.8 kg)  12/28/20 203 lb 9.6 oz (92.4 kg)  11/05/20 207 lb (93.9 kg)   BMI Readings from Last 3 Encounters:  02/04/21 35.07 kg/m  12/28/20 36.07 kg/m  11/05/20 36.67 kg/m    Assessment/Interventions: Review of patient past medical history, allergies, medications, health status, including review of consultants reports, laboratory and other test data, was performed as part of comprehensive evaluation and provision of chronic care management services.   SDOH:  (Social Determinants of Health) assessments and interventions performed: Yes SDOH Interventions    Flowsheet Row Most Recent Value  SDOH Interventions   Financial Strain Interventions Other (Comment)  [PAP]        SDOH Screenings   Alcohol Screen: Low Risk    Last Alcohol Screening Score (AUDIT): 0  Depression (PHQ2-9): Low Risk    PHQ-2 Score: 0  Financial Resource Strain: Low Risk    Difficulty of Paying Living Expenses: Not hard at all  Food Insecurity: No Food Insecurity   Worried About Charity fundraiser in the Last Year: Never true   Ran Out of Food in the Last Year: Never true  Housing: Low  Risk    Last Housing Risk Score: 0  Physical Activity: Inactive   Days of Exercise per Week: 0 days   Minutes of Exercise per Session: 0 min  Social Connections: Moderately Isolated   Frequency of Communication with Friends and Family: Never   Frequency of Social Gatherings with Friends and Family: More than three times a week   Attends Religious Services: Never   Marine scientist or Organizations: No   Attends Music therapist: Never   Marital Status: Married  Stress: No Stress Concern Present   Feeling of Stress : Not at all  Tobacco Use: Low Risk    Smoking Tobacco Use: Never   Smokeless Tobacco Use: Never   Passive Exposure: Not on file  Transportation Needs: No Transportation Needs   Lack of Transportation (Medical):  No   Lack of Transportation (Non-Medical): No    CCM Care Plan  Allergies  Allergen Reactions   Gabapentin Swelling   Quinine Nausea Only and Nausea And Vomiting   Amoxicillin Itching and Rash    Has patient had a PCN reaction causing immediate rash, facial/tongue/throat swelling, SOB or lightheadedness with hypotension: No Has patient had a PCN reaction causing severe rash involving mucus membranes or skin necrosis: No Has patient had a PCN reaction that required hospitalization No Has patient had a PCN reaction occurring within the last 10 years: No If all of the above answers are "NO", then may proceed with Cephalosporin use.    Dilaudid  [Hydromorphone Hcl] Rash   Etodolac Rash   Hydrochlorothiazide Rash   Hydrocodone Rash   Hydromorphone Rash   Sulfa Antibiotics Rash    Medications Reviewed Today     Reviewed by Neldon Labella, RN (Registered Nurse) on 01/19/21 at [place] List Status: <None>   Medication Order Taking? Sig Documenting Provider Last Dose Status Informant  acetaminophen (TYLENOL) 500 MG tablet [id number] No Take 500 mg by mouth every 6 (six) hours as needed for mild pain or moderate pain. [provider] Taking Active Self  ascorbic acid (VITAMIN C) 500 MG tablet [id number] No Take 500 mg by mouth daily. [provider] Taking Active   atorvastatin (LIPITOR) 10 MG tablet [id number] No Take 10 mg by mouth daily at 6 PM.  [provider] Taking Active Self           Med Note Michaelle Birks, Crecencio Kwiatek A   Tue Apr 28, 2020  1:09 PM)    Cholecalciferol (VITAMIN D) 125 MCG (5000 UT) CAPS [id number] No Take 5,000 Units by mouth daily. [provider] Taking Active Self           Med Note Michaelle Birks, Cathe Mons A   Tue Apr 28, 2020  1:10 PM)    Cyanocobalamin (VITAMIN B-12) 5000 MCG SUBL [id number] No Place 5,000 mcg under the tongue daily. [provider] Taking Active   dapagliflozin propanediol (FARXIGA) 10 MG TABS tablet [id number] No Take 1 tablet (10 mg total) by mouth daily. Jerrol Banana., MD Taking Active   furosemide (LASIX) 20 MG tablet [id number] No TAKE 1 TABLET BY MOUTH EVERY DAY Chrismon, Vickki Muff, PA-C Taking Active            Med Note Minerva Ends, FELECIA N   Thu Apr 02, 2020 11:08 AM) Reports taking as needed for edema  glipiZIDE (GLUCOTROL XL) 5 MG 24 hr tablet [id number] No TAKE 1 TABLET BY MOUTH DAILY Jerrol Banana., MD Taking Active   glucose blood [place] VERIO) test strip [id number] No Use as instructed; Check blood sugar twice daily Jerrol Banana., MD Taking Active   [name] 50-1000 MG tablet [id number] No TAKE 1 TABLET BY MOUTH TWICE DAILY WITH A MEAL Jerrol Banana., MD Taking Active   lisinopril-hydrochlorothiazide (ZESTORETIC) 20-12.5 MG tablet [id number] No TAKE 2 TABLETS BY MOUTH EVERY MORNING Jerrol Banana., MD Taking Active   Magnesium 500 MG CAPS [id number] No Take 1 capsule (500 mg total) by mouth at bedtime. Milinda Pointer, MD Taking Expired 12/28/20 2359   metoprolol succinate (TOPROL-XL) 100 MG 24 hr tablet [id number] No TAKE 1 TABLET BY MOUTH DAILY WITH OR IMMEDIATELY FOLLOWING A MEAL Jerrol Banana., MD  Taking Active   tamoxifen (NOLVADEX) 20 MG tablet [id number] No TAKE 1 TABLET(20 MG) BY MOUTH  DAILY Lloyd Huger, MD Taking Active   Med List Note Hart Rochester, RN 02/01/18 1118): UDS 02-01-2018   New patient            Patient Active Problem List   Diagnosis Date Noted   OA (osteoarthritis) of knee 10/30/2018   Lymphedema 10/30/2018   DDD (degenerative disc disease), lumbar 02/28/2018   Osteoarthritis of sacroiliac joint (Left) 02/28/2018   Somatic dysfunction of sacroiliac joint (Left) 02/28/2018   Sacroiliac joint dysfunction (Left) 02/28/2018   Abnormal MRI, lumbar spine (07/22/2013) 02/28/2018   Lumbar facet syndrome (Bilateral) 02/28/2018   Lumbar spondylosis 02/28/2018   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) 02/28/2018   Lumbar facet arthropathy (Bilateral) 02/28/2018   Lumbar intervertebral disc protrusion 02/28/2018   Lumbar nerve root compression 02/28/2018   Morbid obesity with BMI of 40.0-44.9, adult ([place]) 02/27/2018   Hypoalbuminemia 02/27/2018   Hypomagnesemia 02/27/2018   Vitamin B 12 deficiency 02/27/2018   Low serum vitamin B12 02/06/2018   Morbid obesity ([place]) 02/01/2018   Chronic low back pain (Primary Area of Pain) (Bilateral) w/ sciatica (Left) 02/01/2018   Chronic lower extremity pain (Secondary Area of Pain) (Left) 02/01/2018   Chronic pain syndrome 02/01/2018   Pharmacologic therapy 02/01/2018   Disorder of skeletal system 02/01/2018   Problems influencing health status 02/01/2018   Chronic sacroiliac joint pain (Left) 02/01/2018   Bilateral leg edema 02/21/2017   CKD (chronic kidney disease) stage 3, GFR 30-59 ml/min ([name]) 07/21/2016   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   LVH (left ventricular hypertrophy) due to hypertensive disease, without heart failure 01/19/2016   Pulmonary hypertension ([place]) 02/10/2015   Acquired hypothyroidism 09/17/2014   Adaptation reaction 09/17/2014   Absolute anemia 09/17/2014    Cardiac dysrhythmia 09/17/2014   Lumbar facet hypertrophy 09/17/2014   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 09/17/2014   Type 2 diabetes mellitus with stage 3a chronic kidney disease, without long-term current use of insulin ([place]) 09/17/2014   Essential (primary) hypertension 09/17/2014   HLD (hyperlipidemia) 09/17/2014   Extreme obesity 09/17/2014   Vitamin D insufficiency 09/17/2014   Beat, premature ventricular 12/11/2013   OBESITY 11/14/2007   Achilles bursitis or tendinitis 11/14/2007   Calcaneal bursitis (heel), unspecified laterality 06/26/2007    Immunization History  Administered Date(s) Administered   Fluad Quad(high Dose 65+) 01/29/2019, 01/30/2020   Influenza Split 02/25/2006, 03/07/2011, 02/27/2012   Influenza, High Dose Seasonal PF 03/03/2016, 01/26/2017, 01/08/2018   Influenza,inj,Quad PF,6+ Mos 03/04/2013   PFIZER(Purple Top)SARS-COV-2 Vaccination 06/11/2019, 07/02/2019, 01/31/2020   Pneumococcal Conjugate-13 04/13/2015   Pneumococcal Polysaccharide-23 02/17/2007, 02/27/2012   Td 10/21/2003   Tdap 05/11/2016   Zoster, Live 11/08/2010    Conditions to be addressed/monitored:  Hypertension, Hyperlipidemia, Diabetes, Chronic Kidney Disease, Hypothyroidism and Chronic Pain and History of Breast Cancer  Care Plan : General Pharmacy (Adult)  Updates made by Germaine Pomfret, RPH since 04/12/2021 12:00 AM     Problem: Hypertension, Hyperlipidemia, Diabetes, Chronic Kidney Disease, Hypothyroidism and Chronic Pain and History of Breast Cancer   Priority: High     Long-Range Goal: Patient-Specific Goal   Start Date: 08/03/2020  Expected End Date: 05/12/2021  This Visit's Progress: On track  Recent Progress: On track  Priority: High  Note:   Current Barriers:  Unable to independently afford treatment regimen Unable to achieve control of diabetes   Pharmacist Clinical Goal(s):  Patient will verbalize ability to afford treatment regimen achieve control of  diabetes as evidenced  by A1c less than 8% through collaboration with PharmD and provider.   Interventions: 1:1 collaboration with Jerrol Banana., MD regarding development and update of comprehensive plan of care as evidenced by provider attestation and co-signature Inter-disciplinary care team collaboration (see longitudinal plan of care) Comprehensive medication review performed; medication list updated in electronic medical record  Hypertension (BP goal <140/90) -Not ideally controlled -Current treatment: Furosemide 20 mg daily  Lisinopril-HCTZ 20-12.5 mg daily  Metoprolol XL 100 mg daily  -Medications previously tried: NA  -Current home readings:  109/59 on 03/09/21  100/54 on 03/14/21 96/58 on 03/20/21 109/58 on 03/24/21 115/58 on 04/03/21  118/59 on 04/04/21  111/56 on 04/10/21 -Denies hypotensive/hypertensive symptoms -Continue current medications  Hyperlipidemia: (LDL goal < 100) -Controlled -Current treatment: Atorvastatin 10 mg daily  -Medications previously tried: NA  -Current dietary patterns: Trying to eat more vegetables, glucerna shakes, eating less overall.  -Current exercise habits: Physical Therapy exercises 3 days  -Educated on Importance of limiting foods high in cholesterol; Exercise goal of 150 minutes per week; -Recommended to continue current medication  Diabetes (A1c goal <8%) -Controlled -Current medications: Farxiga 10 mg daily Glipizide XL 5 mg daily   [name] 50-1000 mg twice daily -Medications previously tried: NA  -Current home glucose readings fasting glucose: 108,116,139, 139, 140,  -Denies hypoglycemic/hyperglycemic symptoms -Limiting low-carbohydrate 2-3 meals. Drinks mainly water, occasional diet pepsi if eating out.  -Educated on Benefits of routine self-monitoring of blood sugar; -Continue current medications  Patient Goals/Self-Care Activities Patient will:  - check glucose daily before breakfast, document, and provide at  future appointments check blood pressure weekly, document, and provide at future appointments target a minimum of 150 minutes of moderate intensity exercise weekly  Follow Up Plan: Telephone follow up appointment with care management team member scheduled for:  07/09/2020 at 11:00 AM      Medication Assistance:  [name] obtained through [name] medication assistance program.  Enrollment ends Mar 2023 [name] obtained through [name] medication [place] program.  Renewal prescription sent in for 2023.   Patient's preferred pharmacy is:  [id number] [address], [place] [facility] [address] [place] [address] Phone: [phone] Fax: [place], [place] [address] [place] [address] Phone: [phone] Fax: [phone]  Uses pill box? Yes Pt endorses 80% compliance  We discussed: Current pharmacy is preferred with insurance plan and patient is satisfied with pharmacy services Patient decided to: Continue current medication management strategy  Care Plan and Follow Up Patient Decision:  Patient agrees to Care Plan and Follow-up.  Plan: Telephone follow up appointment with care management team member scheduled for:  07/09/2020 at 11:00 AM  [name], PharmD, [place], [place] [phone]

## 2021-04-14 DIAGNOSIS — M25552 Pain in left hip: Secondary | ICD-10-CM | POA: Diagnosis not present

## 2021-04-14 DIAGNOSIS — M5416 Radiculopathy, lumbar region: Secondary | ICD-10-CM | POA: Diagnosis not present

## 2021-04-14 DIAGNOSIS — M7918 Myalgia, other site: Secondary | ICD-10-CM | POA: Diagnosis not present

## 2021-04-14 DIAGNOSIS — M9903 Segmental and somatic dysfunction of lumbar region: Secondary | ICD-10-CM | POA: Diagnosis not present

## 2021-04-14 DIAGNOSIS — M9905 Segmental and somatic dysfunction of pelvic region: Secondary | ICD-10-CM | POA: Diagnosis not present

## 2021-04-14 DIAGNOSIS — M5136 Other intervertebral disc degeneration, lumbar region: Secondary | ICD-10-CM | POA: Diagnosis not present

## 2021-04-14 DIAGNOSIS — M5451 Vertebrogenic low back pain: Secondary | ICD-10-CM | POA: Diagnosis not present

## 2021-04-15 DIAGNOSIS — M1711 Unilateral primary osteoarthritis, right knee: Secondary | ICD-10-CM | POA: Diagnosis not present

## 2021-04-26 NOTE — Progress Notes (Deleted)
[place]  [phone] [phone]  ID: Carol Ramsey OB: 1946/01/23  MR#: [id number]  RFX#:[id number]  Patient Care Team: Jerrol Banana., MD as PCP - General (Family Medicine) Marry Guan, Laurice Record, MD as Consulting Physician (Orthopedic Surgery) Dasher, Rayvon Char, MD as Consulting Physician (Dermatology) Corey Skains, MD as Consulting Physician (Cardiology) Thelma Comp, [place] as Consulting Physician (Optometry) Byrnett, Forest Gleason, MD (General Surgery) Noreene Filbert, MD as Referring Physician (Radiation Oncology) Lloyd Huger, MD as Consulting Physician (Oncology) Neldon Labella, RN as Case Manager Germaine Pomfret, [facility] (Pharmacist)   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  She currently feels well and is asymptomatic.  She is tolerating tamoxifen without significant side effects. She has no neurologic complaints. She denies any recent fevers or illnesses. She has a good appetite and denies weight loss.  She denies any chest pain, shortness of breath, cough, or hemoptysis.  She denies any nausea, vomiting, constipation, or diarrhea. She has no urinary complaints.  Patient feels at her baseline offers no specific complaints today.  REVIEW OF SYSTEMS:   Review of Systems  Constitutional: Negative.  Negative for fever, malaise/fatigue and weight loss.  Respiratory: Negative.  Negative for cough and shortness of breath.   Cardiovascular: Negative.  Negative for chest pain and leg swelling.  Gastrointestinal: Negative.  Negative for abdominal pain.  Genitourinary: Negative.  Negative for dysuria.  Musculoskeletal: Negative.  Negative for back pain.  Skin: Negative.  Negative for rash.  Neurological: Negative.  Negative for sensory change, focal weakness and weakness.  Endo/Heme/Allergies:  Negative for environmental  allergies.  Psychiatric/Behavioral: Negative.  The patient is not nervous/anxious.    As per HPI. Otherwise, a complete review of systems is negative.  PAST MEDICAL HISTORY: Past Medical History:  Diagnosis Date   Anemia    Arthritis    [place]   right breast DCIS grade 3   [place]    basal cell on leg   Diabetes mellitus without complication ([name])    Hyperlipidemia    Hypertension    Hyperthyroidism    Obesity    Personal history of radiation therapy    Right lumpectomy 2018   Thyroid disease    hyperthyroidism   Vitamin D deficiency     PAST SURGICAL HISTORY: Past Surgical History:  Procedure Laterality Date   BREAST BIOPSY Right 06/28/2016   stereotactic biopsy - DUCTAL CARCINOMA IN SITU (DCIS), HIGH NUCLEAR GRADE  (very narrow margins)   BREAST LUMPECTOMY Right 07/18/2016   DCIS   JOINT REPLACEMENT Left    tkr   KNEE SURGERY Left    arthroscopy   MASTECTOMY, PARTIAL Right 07/18/2016   Procedure: MASTECTOMY PARTIAL;  Surgeon: JRobert Bellow MD;  Location: [facility] ORS;  Service: General;  Laterality: Right;   SKIN CANCER EXCISION     face    FAMILY HISTORY: Family History  Problem Relation Age of Onset   Hypertension Mother    Cervical cancer Mother    Heart disease Father    Hypertension Father    Lung cancer Father    Lung cancer Sister    Brain cancer Sister    Diabetes Brother    Heart disease Brother    Hypertension Brother    Stroke Brother    Breast cancer Neg Hx     ADVANCED DIRECTIVES (Y/N):  N  HEALTH MAINTENANCE:  Social History   Tobacco Use   Smoking status: Never   Smokeless tobacco: Never  Vaping Use   Vaping Use: Never used  Substance Use Topics   Alcohol use: No   Drug use: No     Colonoscopy:  PAP:  Bone density:  Lipid panel:  Allergies  Allergen Reactions   Gabapentin Swelling   Quinine Nausea Only and Nausea And Vomiting   Amoxicillin Itching and Rash    Has patient had a PCN  reaction causing immediate rash, facial/tongue/throat swelling, SOB or lightheadedness with hypotension: No Has patient had a PCN reaction causing severe rash involving mucus membranes or skin necrosis: No Has patient had a PCN reaction that required hospitalization No Has patient had a PCN reaction occurring within the last 10 years: No If all of the above answers are "NO", then may proceed with Cephalosporin use.    Dilaudid  [Hydromorphone Hcl] Rash   Etodolac Rash   Hydrochlorothiazide Rash   Hydrocodone Rash   Hydromorphone Rash   Sulfa Antibiotics Rash    Current Outpatient Medications  Medication Sig Dispense Refill   acetaminophen (TYLENOL) 500 MG tablet Take 500 mg by mouth every 6 (six) hours as needed for mild pain or moderate pain.     ascorbic acid (VITAMIN C) 500 MG tablet Take 500 mg by mouth daily.     atorvastatin (LIPITOR) 10 MG tablet Take 10 mg by mouth daily at 6 PM.      Cholecalciferol (VITAMIN D) 125 MCG (5000 UT) CAPS Take 5,000 Units by mouth daily.     Cyanocobalamin (VITAMIN B-12) 5000 MCG SUBL Place 5,000 mcg under the tongue daily.     dapagliflozin propanediol (FARXIGA) 10 MG TABS tablet Take 1 tablet (10 mg total) by mouth daily. Patient [place] through [place]  Patient Assistance 90 tablet 3   furosemide (LASIX) 20 MG tablet Take 1 tablet (20 mg total) by mouth daily as needed for edema. 30 tablet 3   glipiZIDE (GLUCOTROL XL) 5 MG 24 hr tablet TAKE 1 TABLET BY MOUTH DAILY 90 tablet 3   glucose blood (ONETOUCH VERIO) test strip Use as instructed; Check blood sugar twice daily 100 each 12   lisinopril-hydrochlorothiazide (ZESTORETIC) 20-12.5 MG tablet Take 1 tablet by mouth every morning. 90 tablet 1   Magnesium 500 MG CAPS Take 1 capsule (500 mg total) by mouth at bedtime. 30 capsule 5   metoprolol succinate (TOPROL-XL) 100 MG 24 hr tablet TAKE 1 TABLET BY MOUTH DAILY WITH OR IMMEDIATELY FOLLOWING A MEAL 90 tablet 1   sitaGLIPtin-metformin ([name]) 50-1000  MG tablet Take 1 tablet by mouth 2 (two) times daily with a meal. Patient receives through [name] Patient Assistance through Mar 2023. 180 tablet 0   tamoxifen (NOLVADEX) 20 MG tablet TAKE 1 TABLET(20 MG) BY MOUTH DAILY 90 tablet 3   No current facility-administered medications for this visit.    OBJECTIVE: There were no vitals filed for this visit.   There is no height or weight on file to calculate BMI.    ECOG FS:0 - Asymptomatic  General: Well-developed, well-nourished, no acute distress. HEENT: Normocephalic. Neuro: Alert, answering all questions appropriately. Cranial nerves grossly intact. Psych: Normal affect.  LAB RESULTS:  Lab Results  Component Value Date   NA 135 02/19/2020   K 4.5 02/19/2020   CL 100 02/19/2020   CO2 21 02/19/2020   GLUCOSE 170 (H) 02/19/2020   BUN 19 02/19/2020   CREATININE 1.20 (H) 02/19/2020   CALCIUM 8.9 02/19/2020  PROT 6.6 02/19/2020   ALBUMIN 3.9 02/19/2020   AST 15 02/19/2020   ALT 8 02/19/2020   ALKPHOS 53 02/19/2020   BILITOT 0.5 02/19/2020   GFRNONAA 45 (L) 02/19/2020   GFRAA 52 (L) 02/19/2020    Lab Results  Component Value Date   WBC 9.2 02/19/2020   NEUTROABS 6.1 02/19/2020   HGB 11.7 02/19/2020   HCT 35.5 02/19/2020   MCV 89 02/19/2020   PLT 200 02/19/2020     STUDIES: No results found.  ASSESSMENT: Right breast high grade DCIS.  PLAN:    1. Right breast high grade DCIS: Final pathology was reviewed independently as also discussed at breast case conference. Patient had close margins less than 0.5 mm and was given the option for reexcision to obtain better margins or proceed directly with XRT. She declined reexcision and subsequently underwent adjuvant XRT which she completed in May 2018.  Continue tamoxifen for total of 5 years completing treatment in May 2023.  Her most recent mammogram on June 27, 2019 was reported as BI-RADS 2, repeat in February 2022.  Return to clinic in 6 months for routine evaluation. 2.   Hypertension: Chronic and unchanged.  Continue monitoring and treatment per primary care.  3.  Renal insufficiency: Patient's most recent creatinine on May 30, 2019 was reported at 1.21.  Monitor.   4.  Anemia: Chronic and unchanged.   Patient expressed understanding and was in agreement with this plan. She also understands that She can call clinic at any time with any questions, concerns, or complaints.    Cancer Staging  Ductal carcinoma in situ (DCIS) of right breast Staging form: Breast, AJCC 8th Edition - Pathologic stage from 08/02/2016: Stage 0 (pTis (DCIS), pN0, cM0, ER+, PR+, HER2-) - Signed by Lloyd Huger, MD on 08/02/2016 Neoadjuvant therapy: No Nuclear grade: G3 Laterality: Right   Lloyd Huger, MD   04/26/2021 9:08 AM

## 2021-04-27 ENCOUNTER — Ambulatory Visit: Payer: Self-pay

## 2021-04-27 ENCOUNTER — Telehealth: Payer: Medicare Other | Admitting: Physician Assistant

## 2021-04-27 DIAGNOSIS — U071 COVID-19: Secondary | ICD-10-CM

## 2021-04-27 MED ORDER — BENZONATATE 100 MG PO CAPS: 100.0000 mg | ORAL_CAPSULE | Freq: Three times a day (TID) | ORAL | 0 refills | Status: DC | PRN

## 2021-04-27 MED ORDER — FLUTICASONE PROPIONATE 50 MCG/ACT NA SUSP
2.0000 | Freq: Every day | NASAL | 0 refills | Status: DC
Start: 2021-04-27 — End: 2021-11-24

## 2021-04-27 MED ORDER — GUAIFENESIN ER 600 MG PO TB12: 600.0000 mg | ORAL_TABLET | Freq: Two times a day (BID) | ORAL | 0 refills | Status: DC

## 2021-04-27 NOTE — Progress Notes (Signed)
Virtual Visit Consent   Carol Ramsey, you are scheduled for a virtual visit with a [facility] provider today.     Just as with appointments in the office, your consent must be obtained to participate.  Your consent will be active for this visit and any virtual visit you may have with one of our providers in the next 365 days.     If you have a MyChart account, a copy of this consent can be sent to you electronically.  All virtual visits are billed to your insurance company just like a traditional visit in the office.    As this is a virtual visit, video technology does not allow for your provider to perform a traditional examination.  This may limit your provider's ability to fully assess your condition.  If your provider identifies any concerns that need to be evaluated in person or the need to arrange testing (such as labs, EKG, etc.), we will make arrangements to do so.     Although advances in technology are sophisticated, we cannot ensure that it will always work on either your end or our end.  If the connection with a video visit is poor, the visit may have to be switched to a telephone visit.  With either a video or telephone visit, we are not always able to ensure that we have a secure connection.     I need to obtain your verbal consent now.   Are you willing to proceed with your visit today?    Carol Ramsey has provided verbal consent on 04/27/2021 for a virtual visit (video or telephone).   Mar Daring, PA-C   Date: 04/27/2021 7:13 PM   Virtual Visit via Video Note   I, Mar Daring, connected with  Carol Ramsey  ([id number], 01-14-1946) on 04/27/21 at  6:45 PM EST by a video-enabled telemedicine application and verified that I am speaking with the correct person using two identifiers.  Location: Patient: Virtual Visit Location Patient: Home Provider: Virtual Visit Location Provider: Home Office   I discussed the limitations of evaluation and management by  telemedicine and the availability of in person appointments. The patient expressed understanding and agreed to proceed.    Interactive audio and video communications were attempted, although failed due to patient's inability to connect to video. Continued visit with audio only interaction with patient agreement.   History of Present Illness: Carol Ramsey is a 75 y.o. who identifies as a female who was assigned female at birth, and is being seen today for Covid 36.  HPI: URI  This is a new problem. Episode onset: Symptoms started last Thursday, tested positive for COvid 19 on Saturday. There has been no fever. Associated symptoms include congestion, coughing, diarrhea (Thursday and Friday, none since), ear pain (left ear), rhinorrhea and a sore throat (congested). Pertinent negatives include no headaches, nausea, plugged ear sensation, sinus pain, vomiting or wheezing. Associated symptoms comments: Chills, myalgias, eyes feel tired . Treatments tried: tylenol. The treatment provided mild relief.     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) 02/28/2018   Lumbar facet arthropathy (Bilateral) 02/28/2018   Lumbar intervertebral disc protrusion 02/28/2018   Lumbar nerve root compression 02/28/2018   Morbid obesity with BMI of 40.0-44.9, adult ([place]) 02/27/2018   Hypoalbuminemia 02/27/2018   Hypomagnesemia 02/27/2018   Vitamin B 12 deficiency 02/27/2018   Low serum vitamin B12 02/06/2018   Morbid obesity ([place]) 02/01/2018    Chronic low back pain (Primary Area of Pain) (Bilateral) w/ sciatica (Left) 02/01/2018   Chronic lower extremity pain (Secondary Area of Pain) (Left) 02/01/2018   Chronic pain syndrome 02/01/2018   Pharmacologic therapy 02/01/2018   Disorder of skeletal system 02/01/2018   Problems influencing health status 02/01/2018   Chronic sacroiliac joint pain (Left) 02/01/2018   Bilateral leg edema 02/21/2017   CKD (chronic kidney disease) stage 3, GFR 30-59 ml/min ([name]) 07/21/2016   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   LVH (left ventricular hypertrophy) due to hypertensive disease, without heart failure 01/19/2016   Pulmonary hypertension ([place]) 02/10/2015   Acquired hypothyroidism 09/17/2014   Adaptation reaction 09/17/2014   Absolute anemia 09/17/2014   Cardiac dysrhythmia 09/17/2014   Lumbar facet hypertrophy 09/17/2014   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 09/17/2014   Type 2 diabetes mellitus with stage 3a chronic kidney disease, without long-term current use of insulin ([place]) 09/17/2014   Essential (primary) hypertension 09/17/2014   HLD (hyperlipidemia) 09/17/2014   Extreme obesity 09/17/2014   Vitamin D insufficiency 09/17/2014   Beat, premature ventricular 12/11/2013   OBESITY 11/14/2007   Achilles bursitis or tendinitis 11/14/2007   Calcaneal bursitis (heel), unspecified laterality 06/26/2007    Allergies:  Allergies  Allergen Reactions   Gabapentin Swelling   Quinine Nausea Only and Nausea And Vomiting   Amoxicillin Itching and Rash    Has patient had a PCN reaction causing immediate rash, facial/tongue/throat swelling, SOB or lightheadedness with hypotension: No Has patient had a PCN reaction causing severe rash involving mucus membranes or skin necrosis: No Has patient had a PCN reaction that required hospitalization No Has patient had a PCN reaction occurring within the last 10 years: No If all of the above answers are "NO", then may proceed  with Cephalosporin use.    Dilaudid  [Hydromorphone Hcl] Rash   Etodolac Rash   Hydrochlorothiazide Rash   Hydrocodone Rash   Hydromorphone Rash   Sulfa Antibiotics Rash   Medications:  Current Outpatient Medications:    benzonatate (TESSALON) 100 MG capsule, Take 1 capsule (100 mg total) by mouth 3 (three) times daily as needed., Disp: 30 capsule, Rfl: 0   fluticasone (FLONASE) 50 MCG/ACT nasal spray, Place 2 sprays into both nostrils daily., Disp: 16 g, Rfl: 0   guaiFENesin (MUCINEX) 600 MG 12 hr tablet, Take 1 tablet (600 mg total) by mouth 2 (two) times daily., Disp: 30 tablet, Rfl: 0   acetaminophen (TYLENOL) 500 MG tablet, Take 500 mg by mouth every 6 (six) hours as needed for mild pain or moderate pain., Disp: , Rfl:    ascorbic acid (VITAMIN C) 500 MG tablet, Take 500 mg by mouth daily., Disp: , Rfl:    atorvastatin (LIPITOR) 10 MG tablet, Take 10 mg by mouth daily at 6 PM. , Disp: , Rfl:    Cholecalciferol (VITAMIN D) 125 MCG (5000 UT) CAPS, Take 5,000 Units by mouth daily., Disp: , Rfl:    Cyanocobalamin (VITAMIN B-12) 5000 MCG SUBL, Place 5,000 mcg under the tongue daily., Disp: , Rfl:  dapagliflozin propanediol (FARXIGA) 10 MG TABS tablet, Take 1 tablet (10 mg total) by mouth daily. Patient receives through [place]  Patient Assistance, Disp: 90 tablet, Rfl: 3   furosemide (LASIX) 20 MG tablet, Take 1 tablet (20 mg total) by mouth daily as needed for edema., Disp: 30 tablet, Rfl: 3   glipiZIDE (GLUCOTROL XL) 5 MG 24 hr tablet, TAKE 1 TABLET BY MOUTH DAILY, Disp: 90 tablet, Rfl: 3   glucose blood (ONETOUCH VERIO) test strip, Use as instructed; Check blood sugar twice daily, Disp: 100 each, Rfl: 12   lisinopril-hydrochlorothiazide (ZESTORETIC) 20-12.5 MG tablet, Take 1 tablet by mouth every morning., Disp: 90 tablet, Rfl: 1   Magnesium 500 MG CAPS, Take 1 capsule (500 mg total) by mouth at bedtime., Disp: 30 capsule, Rfl: 5   metoprolol succinate (TOPROL-XL) 100 MG 24 hr tablet, TAKE  1 TABLET BY MOUTH DAILY WITH OR IMMEDIATELY FOLLOWING A MEAL, Disp: 90 tablet, Rfl: 1   sitaGLIPtin-metformin ([name]) 50-1000 MG tablet, Take 1 tablet by mouth 2 (two) times daily with a meal. Patient receives through [name] Patient Assistance through Mar 2023., Disp: 180 tablet, Rfl: 0   tamoxifen (NOLVADEX) 20 MG tablet, TAKE 1 TABLET(20 MG) BY MOUTH DAILY, Disp: 90 tablet, Rfl: 3  Observations/Objective: Patient is well-developed, well-nourished in no acute distress.  Resting comfortably at home.  Head is normocephalic, atraumatic.  No labored breathing.  Speech is clear and coherent with logical content.  Patient is alert and oriented at baseline.    Assessment and Plan: 1. COVID-19 - fluticasone (FLONASE) 50 MCG/ACT nasal spray; Place 2 sprays into both nostrils daily.  Dispense: 16 g; Refill: 0 - benzonatate (TESSALON) 100 MG capsule; Take 1 capsule (100 mg total) by mouth 3 (three) times daily as needed.  Dispense: 30 capsule; Refill: 0 - guaiFENesin (MUCINEX) 600 MG 12 hr tablet; Take 1 tablet (600 mg total) by mouth 2 (two) times daily.  Dispense: 30 tablet; Refill: 0 - MyChart COVID-19 home monitoring program; Future  - Patient is on Day 6 of symptoms and is not a candidate for anti-viral treatment at this time - Continue OTC symptomatic management of choice - Will send OTC vitamins and supplement information through [facility] - Fluticasone, Mucinex and Tessalon perles prescribed - Patient enrolled in MyChart symptom monitoring - Push fluids - Rest as needed - Discussed return precautions and when to seek in-person evaluation, sent via [facility] as well  Follow Up Instructions: I discussed the assessment and treatment plan with the patient. The patient was provided an opportunity to ask questions and all were answered. The patient agreed with the plan and demonstrated an understanding of the instructions.  A copy of instructions were sent to the patient via MyChart unless otherwise noted  below.    The patient was advised to call back or seek an in-person evaluation if the symptoms worsen or if the condition fails to improve as anticipated.  Time:  I spent 21 minutes with the patient via telehealth technology discussing the above problems/concerns.    Mar Daring, PA-C

## 2021-04-27 NOTE — Telephone Encounter (Signed)
Summary: covid positive   Pt tested positive for Covid on Saturday with a home test/ pt is experiencing a cough, sneezing, chills and runny nose / please advise pt on what to do     Called pt and LM on VM to call back.

## 2021-04-27 NOTE — Telephone Encounter (Signed)
Chief Complaint: COVID positive home test on 04/24/21 Symptoms: Runny nose, cough, sneezing not as bad, no fever Frequency: symptoms started on 04/22/21 Pertinent Negatives: Patient denies SOB, fever Disposition: [] ED /[] Urgent Care (no appt availability in office) / [] Appointment(In office/virtual)/ [x]  [facility]/ [] Home Care/ [] Refused Recommended Disposition  Additional Notes: Virtual UC scheduled for today at 1845.    Summary: covid positive   Pt tested positive for Covid on Saturday with a home test/ pt is experiencing a cough, sneezing, chills and runny nose / please advise pt on what to do        Reason for Disposition  [1] HIGH RISK for severe COVID complications (e.g., weak immune system, age > 65 years, obesity with BMI > 25, pregnant, chronic lung disease or other chronic medical condition) AND [2] COVID symptoms (e.g., cough, fever)  (Exceptions: Already seen by PCP and no new or worsening symptoms.)  Answer Assessment - Initial Assessment Questions 1. COVID-19 DIAGNOSIS: "Who made your COVID-19 diagnosis?" "Was it confirmed by a positive lab test or self-test?" If not diagnosed by a doctor (or NP/PA), ask "Are there lots of cases (community spread) where you live?" Note: See public health department website, if unsure.     Home test on Saturday 04/24/21 2. COVID-19 EXPOSURE: "Was there any known exposure to COVID before the symptoms began?" CDC Definition of close contact: within 6 feet (2 meters) for a total of 15 minutes or more over a 24-hour period.      Unknown 3. ONSET: "When did the COVID-19 symptoms start?"      Thursday 04/22/21-cough, runny nose, sneezing 4. WORST SYMPTOM: "What is your worst symptom?" (e.g., cough, fever, shortness of breath, muscle aches)     Cough 5. COUGH: "Do you have a cough?" If Yes, ask: "How bad is the cough?"       Yes, not as bad as it has been 6. FEVER: "Do you have a fever?" If Yes, ask: "What is your temperature, how  was it measured, and when did it start?"     No thermometer, not feeling hot 7. RESPIRATORY STATUS: "Describe your breathing?" (e.g., shortness of breath, wheezing, unable to speak)      No 8. BETTER-SAME-WORSE: "Are you getting better, staying the same or getting worse compared to yesterday?"  If getting worse, ask, "In what way?"     Same 9. HIGH RISK DISEASE: "Do you have any chronic medical problems?" (e.g., asthma, heart or lung disease, weak immune system, obesity, etc.)     No, diabetic 10. VACCINE: "Have you had the COVID-19 vaccine?" If Yes, ask: "Which one, how many shots, when did you get it?"       Yes 2 shots pfizer 11. BOOSTER: "Have you received your COVID-19 booster?" If Yes, ask: "Which one and when did you get it?"       2 boosters pfizer 12. PREGNANCY: "Is there any chance you are pregnant?" "When was your last menstrual period?"       No 13. OTHER SYMPTOMS: "Do you have any other symptoms?"  (e.g., chills, fatigue, headache, loss of smell or taste, muscle pain, sore throat)       Chills, headache, runny nose, sneezing has gotten better 14. O2 SATURATION MONITOR:  "Do you use an oxygen saturation monitor (pulse oximeter) at home?" If Yes, ask "What is your reading (oxygen level) today?" "What is your usual oxygen saturation reading?" (e.g., 95%)       N/A  Protocols used:  Coronavirus (YPEJY-11) Diagnosed or Suspected-A-AH

## 2021-04-27 NOTE — Patient Instructions (Signed)
Hello Carol Ramsey,  You are being placed in the home monitoring program for COVID-19 (commonly known as Coronavirus).  This is because you are suspected to have the virus or are known to have the virus.  If you are unsure which group you fall into call your clinic.    As part of this program, you'll answer a daily questionnaire in the MyChart mobile app. You'll receive a notification through the MyChart app when the questionnaire is available. When you log in to MyChart, you'll see the tasks in your To Do activity.       Clinicians will see any answers that are concerning and take appropriate steps.  If at any point you are having a medical emergency, call 911.  If otherwise concerned call your clinic instead of coming into the clinic or hospital.  To keep from spreading the disease you should: Stay home and limit contact with other people as much as possible.  Wash your hands frequently. Cover your coughs and sneezes with a tissue, and throw used tissues in the trash.   Clean and disinfect frequently touched surfaces and objects.    Take care of yourself by: Staying home Resting Drinking fluids Take fever-reducing medications (Tylenol/Acetaminophen and Ibuprofen)  For more information on the disease go to the Centers for Disease Control and Prevention website     10 Things You Can Do to Manage Your COVID-19 Symptoms at Home If you have possible or confirmed COVID-19 Stay home except to get medical care. Monitor your symptoms carefully. If your symptoms get worse, call your healthcare provider immediately. Get rest and stay hydrated. If you have a medical appointment, call the healthcare provider ahead of time and tell them that you have or may have COVID-19. For medical emergencies, call 911 and notify the dispatch personnel that you have or may have COVID-19. Cover your cough and sneezes with a tissue or use the inside of your elbow. Wash your hands often with soap and water for at least  20 seconds or clean your hands with an alcohol-based hand sanitizer that contains at least 60% alcohol. As much as possible, stay in a specific room and away from other people in your home. Also, you should use a separate bathroom, if available. If you need to be around other people in or outside of the home, wear a mask. Avoid sharing personal items with other people in your household, like dishes, towels, and bedding. Clean all surfaces that are touched often, like counters, tabletops, and doorknobs. Use household cleaning sprays or wipes according to the label instructions. [email or web address] 11/15/2019 This information is not intended to replace advice given to you by your health care provider. Make sure you discuss any questions you have with your health care provider. Document Revised: 01/08/2021 Document Reviewed: 01/08/2021 Elsevier Patient Education  [place].

## 2021-04-28 ENCOUNTER — Inpatient Hospital Stay: Payer: Medicare Other | Admitting: Oncology

## 2021-05-01 ENCOUNTER — Other Ambulatory Visit: Payer: Self-pay | Admitting: Family Medicine

## 2021-05-01 DIAGNOSIS — N1831 Chronic kidney disease, stage 3a: Secondary | ICD-10-CM

## 2021-05-01 DIAGNOSIS — I10 Essential (primary) hypertension: Secondary | ICD-10-CM

## 2021-05-01 NOTE — Telephone Encounter (Signed)
Requested Prescriptions  Pending Prescriptions Disp Refills   FARXIGA 10 MG TABS tablet [Pharmacy Med Name: FARXIGA 10MG TABLETS] 90 tablet     Sig: TAKE 1 TABLET(10 MG) BY MOUTH DAILY     Endocrinology:  Diabetes - SGLT2 Inhibitors Failed - 05/01/2021  7:08 AM      Failed - Cr in normal range and within 360 days    Creat  Date Value Ref Range Status  01/26/2017 1.16 (H) 0.60 - 0.93 mg/dL Final    Comment:    For patients >74 years of age, the reference limit for Creatinine is approximately 13% higher for people identified as African-American. .    Creatinine, Ser  Date Value Ref Range Status  02/19/2020 1.20 (H) 0.57 - 1.00 mg/dL Final         Failed - LDL in normal range and within 360 days    LDL Cholesterol (Calc)  Date Value Ref Range Status  01/26/2017 43 mg/dL (calc) Final    Comment:    Reference range: <100 . Desirable range <100 mg/dL for primary prevention;   <70 mg/dL for patients with CHD or diabetic patients  with > or = 2 CHD risk factors. [name] LDL-C is now calculated using the Martin-Hopkins  calculation, which is a validated novel method providing  better accuracy than the Friedewald equation in the  estimation of LDL-C.  Cresenciano Genre et al. Annamaria Helling. 9147;829(56): 2061-2068  (http://education.Quest[employer].com/faq/FAQ164)    LDL Chol Calc (NIH)  Date Value Ref Range Status  02/19/2020 42 0 - 99 mg/dL Final         Failed - eGFR in normal range and within 360 days    EGFR (African American)  Date Value Ref Range Status  08/31/2011 57 (L)  Final   GFR calc Af Amer  Date Value Ref Range Status  02/19/2020 52 (L) >59 mL/min/1.73 Final    Comment:    **In accordance with recommendations from the NKF-ASN Task force,**   [facility] is in the process of updating its eGFR calculation to the   2021 CKD-EPI creatinine equation that estimates kidney function   without a race variable.    EGFR (Non-African Amer.)  Date Value Ref Range Status  08/31/2011 49  (L)  Final    Comment:    eGFR values <15m/min/1.73 m2 may be an indication of chronic kidney disease (CKD). Calculated eGFR is useful in patients with stable renal function. The eGFR calculation will not be reliable in acutely ill patients when serum creatinine is changing rapidly. It is not useful in  patients on dialysis. The eGFR calculation may not be applicable to patients at the low and high extremes of body sizes, pregnant women, and vegetarians.    GFR calc non Af Amer  Date Value Ref Range Status  02/19/2020 45 (L) >59 mL/min/1.73 Final         Passed - HBA1C is between 0 and 7.9 and within 180 days    Hemoglobin A1C  Date Value Ref Range Status  02/12/2021 7.8 (A) 4.0 - 5.6 % Final   Hgb A1c MFr Bld  Date Value Ref Range Status  02/19/2020 7.6 (H) 4.8 - 5.6 % Final    Comment:             Prediabetes: 5.7 - 6.4          Diabetes: >6.4          Glycemic control for adults with diabetes: <7.0  Passed - Valid encounter within last 6 months    Recent Outpatient Visits          2 months ago Type 2 diabetes mellitus with stage 3a chronic kidney disease, without long-term current use of insulin ([place])   [facility] Jerrol Banana., MD   5 months ago Type 2 diabetes mellitus with stage 3a chronic kidney disease, without long-term current use of insulin ([place])   [facility] Jerrol Banana., MD   9 months ago Type 2 diabetes mellitus without complication, without long-term current use of insulin [facility])   [facility] Jerrol Banana., MD   10 months ago Type 2 diabetes mellitus without complication, without long-term current use of insulin [facility])   [facility] Jerrol Banana., MD   1 year ago Type 2 diabetes mellitus with complication, without long-term current use of insulin [facility])   [facility] Jerrol Banana., MD      Future Appointments            In 1  month Jerrol Banana., MD [facility]            metoprolol succinate (TOPROL-XL) 100 MG 24 hr tablet [Pharmacy Med Name: METOPROLOL ER SUCCINATE 100MG TABS] 90 tablet 1    Sig: TAKE 1 TABLET BY MOUTH DAILY WITH OR IMMEDIATELY FOLLOWING A MEAL     Cardiovascular:  Beta Blockers Failed - 05/01/2021  7:08 AM      Failed - Last BP in normal range    BP Readings from Last 1 Encounters:  02/04/21 (!) 133/46         Passed - Last Heart Rate in normal range    Pulse Readings from Last 1 Encounters:  02/04/21 (!) 57         Passed - Valid encounter within last 6 months    Recent Outpatient Visits          2 months ago Type 2 diabetes mellitus with stage 3a chronic kidney disease, without long-term current use of insulin ([place])   [facility] Jerrol Banana., MD   5 months ago Type 2 diabetes mellitus with stage 3a chronic kidney disease, without long-term current use of insulin ([place])   [facility] Jerrol Banana., MD   9 months ago Type 2 diabetes mellitus without complication, without long-term current use of insulin [facility])   [facility] Jerrol Banana., MD   10 months ago Type 2 diabetes mellitus without complication, without long-term current use of insulin [facility])   [facility] Jerrol Banana., MD   1 year ago Type 2 diabetes mellitus with complication, without long-term current use of insulin [facility])   [facility] Jerrol Banana., MD      Future Appointments            In 1 month Jerrol Banana., MD [facility]

## 2021-05-01 NOTE — Telephone Encounter (Signed)
Patient receives through [place]  Patient Assistance Last RF 03/05/21 #90 3 RF  Requested Prescriptions  Signed Prescriptions Disp Refills   metoprolol succinate (TOPROL-XL) 100 MG 24 hr tablet 90 tablet 1    Sig: TAKE 1 TABLET BY MOUTH DAILY WITH OR IMMEDIATELY FOLLOWING A MEAL     Cardiovascular:  Beta Blockers Failed - 05/01/2021  7:08 AM      Failed - Last BP in normal range    BP Readings from Last 1 Encounters:  02/04/21 (!) 133/46         Passed - Last Heart Rate in normal range    Pulse Readings from Last 1 Encounters:  02/04/21 (!) 40         Passed - Valid encounter within last 6 months    Recent Outpatient Visits          2 months ago Type 2 diabetes mellitus with stage 3a chronic kidney disease, without long-term current use of insulin ([place])   [facility] Jerrol Banana., MD   5 months ago Type 2 diabetes mellitus with stage 3a chronic kidney disease, without long-term current use of insulin ([place])   [facility] Jerrol Banana., MD   9 months ago Type 2 diabetes mellitus without complication, without long-term current use of insulin [facility])   [facility] Jerrol Banana., MD   10 months ago Type 2 diabetes mellitus without complication, without long-term current use of insulin [facility])   [facility] Jerrol Banana., MD   1 year ago Type 2 diabetes mellitus with complication, without long-term current use of insulin [facility])   [facility] Jerrol Banana., MD      Future Appointments            In 1 month Jerrol Banana., MD [facility]           Refused Prescriptions Disp Refills   FARXIGA 10 MG TABS tablet [Pharmacy Med Name: FARXIGA 10MG TABLETS] 90 tablet     Sig: TAKE 1 TABLET(10 MG) BY MOUTH DAILY     Endocrinology:  Diabetes - SGLT2 Inhibitors Failed - 05/01/2021  7:08 AM      Failed - Cr in normal range and within 360 days     Creat  Date Value Ref Range Status  01/26/2017 1.16 (H) 0.60 - 0.93 mg/dL Final    Comment:    For patients >46 years of age, the reference limit for Creatinine is approximately 13% higher for people identified as African-American. .    Creatinine, Ser  Date Value Ref Range Status  02/19/2020 1.20 (H) 0.57 - 1.00 mg/dL Final         Failed - LDL in normal range and within 360 days    LDL Cholesterol (Calc)  Date Value Ref Range Status  01/26/2017 43 mg/dL (calc) Final    Comment:    Reference range: <100 . Desirable range <100 mg/dL for primary prevention;   <70 mg/dL for patients with CHD or diabetic patients  with > or = 2 CHD risk factors. [name] LDL-C is now calculated using the Martin-Hopkins  calculation, which is a validated novel method providing  better accuracy than the Friedewald equation in the  estimation of LDL-C.  Cresenciano Genre et al. Annamaria Helling. 6004;599(77): 2061-2068  (http://education.[facility].com/faq/FAQ164)    LDL Chol Calc (NIH)  Date Value Ref Range Status  02/19/2020 42 0 - 99 mg/dL Final  Failed - eGFR in normal range and within 360 days    EGFR (African American)  Date Value Ref Range Status  08/31/2011 57 (L)  Final   GFR calc Af Amer  Date Value Ref Range Status  02/19/2020 52 (L) >59 mL/min/1.73 Final    Comment:    **In accordance with recommendations from the NKF-ASN Task force,**   [facility] is in the process of updating its eGFR calculation to the   2021 CKD-EPI creatinine equation that estimates kidney function   without a race variable.    EGFR (Non-African Amer.)  Date Value Ref Range Status  08/31/2011 49 (L)  Final    Comment:    eGFR values <78m/min/1.73 m2 may be an indication of chronic kidney disease (CKD). Calculated eGFR is useful in patients with stable renal function. The eGFR calculation will not be reliable in acutely ill patients when serum creatinine is changing rapidly. It is not useful in  patients on  dialysis. The eGFR calculation may not be applicable to patients at the low and high extremes of body sizes, pregnant women, and vegetarians.    GFR calc non Af Amer  Date Value Ref Range Status  02/19/2020 45 (L) >59 mL/min/1.73 Final         Passed - HBA1C is between 0 and 7.9 and within 180 days    Hemoglobin A1C  Date Value Ref Range Status  02/12/2021 7.8 (A) 4.0 - 5.6 % Final   Hgb A1c MFr Bld  Date Value Ref Range Status  02/19/2020 7.6 (H) 4.8 - 5.6 % Final    Comment:             Prediabetes: 5.7 - 6.4          Diabetes: >6.4          Glycemic control for adults with diabetes: <7.0          Passed - Valid encounter within last 6 months    Recent Outpatient Visits          2 months ago Type 2 diabetes mellitus with stage 3a chronic kidney disease, without long-term current use of insulin ([place]   [facility]   5 months ago Type 2 diabetes mellitus with stage 3a chronic kidney disease, without long-term current use of insulin ([facility]   [facility]   9 months ago Type 2 diabetes mellitus without complication, without long-term current use of insulin ([facility]   [facility]   10 months ago Type 2 diabetes mellitus without complication, without long-term current use of insulin ([facility]   [facility]   1 year ago Type 2 diabetes mellitus with complication, without long-term current use of insulin ([facility]   [facility]      Future Appointments            In 1 month [facility]           called pt and she asked for the [facility] to be removed form pharmacy profile. Routing to office to make sure ok to dc.

## 2021-05-03 DIAGNOSIS — Z20822 Contact with and (suspected) exposure to covid-19: Secondary | ICD-10-CM | POA: Diagnosis not present

## 2021-05-06 DIAGNOSIS — M1711 Unilateral primary osteoarthritis, right knee: Secondary | ICD-10-CM | POA: Diagnosis not present

## 2021-05-09 NOTE — Progress Notes (Signed)
[name]  [phone] [phone]  ID: Carol Ramsey OB: [birth year]  MR#: [id number]  DGU#:[id number]  Patient Care Team: Jerrol Banana., MD as PCP - General (Family Medicine) Marry Guan, Laurice Record, MD as Consulting Physician (Orthopedic Surgery) Dasher, Rayvon Char, MD as Consulting Physician (Dermatology) Corey Skains, MD as Consulting Physician (Cardiology) Thelma Comp, [place] as Consulting Physician (Optometry) Byrnett, Forest Gleason, MD (General Surgery) Noreene Filbert, MD as Referring Physician (Radiation Oncology) Lloyd Huger, MD as Consulting Physician (Oncology) Neldon Labella, RN as Case Manager Germaine Pomfret, [facility] (Pharmacist)   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  She continues to feel well and remains asymptomatic.  She is tolerating tamoxifen without significant side effects. She has no neurologic complaints. She denies any recent fevers or illnesses. She has a good appetite and denies weight loss.  She denies any chest pain, shortness of breath, cough, or hemoptysis.  She denies any nausea, vomiting, constipation, or diarrhea. She has no urinary complaints.  Patient offers no specific complaints today.  REVIEW OF SYSTEMS:   Review of Systems  Constitutional: Negative.  Negative for fever, malaise/fatigue and weight loss.  Respiratory: Negative.  Negative for cough and shortness of breath.   Cardiovascular: Negative.  Negative for chest pain and leg swelling.  Gastrointestinal: Negative.  Negative for abdominal pain.  Genitourinary: Negative.  Negative for dysuria.  Musculoskeletal: Negative.  Negative for back pain.  Skin: Negative.  Negative for rash.  Neurological: Negative.  Negative for sensory change, focal weakness and weakness.  Endo/Heme/Allergies:  Negative for environmental allergies.  Psychiatric/Behavioral:  Negative.  The patient is not nervous/anxious.    As per HPI. Otherwise, a complete review of systems is negative.  PAST MEDICAL HISTORY: Past Medical History:  Diagnosis Date   Anemia    Arthritis    Breast cancer ([place]) 06/28/2016   right breast DCIS grade 3   Cancer ([place])    basal cell on leg   Diabetes mellitus without complication ([name])    Hyperlipidemia    Hypertension    Hyperthyroidism    Obesity    Personal history of radiation therapy    Right lumpectomy 2018   Thyroid disease    hyperthyroidism   Vitamin D deficiency     PAST SURGICAL HISTORY: Past Surgical History:  Procedure Laterality Date   BREAST BIOPSY Right 06/28/2016   stereotactic biopsy - DUCTAL CARCINOMA IN SITU (DCIS), HIGH NUCLEAR GRADE  (very narrow margins)   BREAST LUMPECTOMY Right 07/18/2016   DCIS   JOINT REPLACEMENT Left    tkr   KNEE SURGERY Left    arthroscopy   MASTECTOMY, PARTIAL Right 07/18/2016   Procedure: MASTECTOMY PARTIAL;  Surgeon: Robert Bellow, MD;  Location: [facility] ORS;  Service: General;  Laterality: Right;   SKIN CANCER EXCISION     face    FAMILY HISTORY: Family History  Problem Relation Age of Onset   Hypertension Mother    Cervical cancer Mother    Heart disease Father    Hypertension Father    Lung cancer Father    Lung cancer Sister    Brain cancer Sister    Diabetes Brother    Heart disease Brother    Hypertension Brother    Stroke Brother    Breast cancer Neg Hx     ADVANCED DIRECTIVES (Y/N):  N  HEALTH MAINTENANCE: Social History   Tobacco  Use   Smoking status: Never   Smokeless tobacco: Never  Vaping Use   Vaping Use: Never used  Substance Use Topics   Alcohol use: No   Drug use: No     Colonoscopy:  PAP:  Bone density:  Lipid panel:  Allergies  Allergen Reactions   Gabapentin Swelling   Quinine Nausea Only and Nausea And Vomiting   Amoxicillin Itching and Rash    Has patient had a PCN reaction causing immediate rash,  facial/tongue/throat swelling, SOB or lightheadedness with hypotension: No Has patient had a PCN reaction causing severe rash involving mucus membranes or skin necrosis: No Has patient had a PCN reaction that required hospitalization No Has patient had a PCN reaction occurring within the last 10 years: No If all of the above answers are "NO", then may proceed with Cephalosporin use.    Dilaudid  [Hydromorphone Hcl] Rash   Etodolac Rash   Hydrochlorothiazide Rash   Hydrocodone Rash   Hydromorphone Rash   Sulfa Antibiotics Rash    Current Outpatient Medications  Medication Sig Dispense Refill   acetaminophen (TYLENOL) 500 MG tablet Take 500 mg by mouth every 6 (six) hours as needed for mild pain or moderate pain.     ascorbic acid (VITAMIN C) 500 MG tablet Take 500 mg by mouth daily.     atorvastatin (LIPITOR) 10 MG tablet Take 10 mg by mouth daily at 6 PM.      Cholecalciferol (VITAMIN D) 125 MCG (5000 UT) CAPS Take 5,000 Units by mouth daily.     Cyanocobalamin (VITAMIN B-12) 5000 MCG SUBL Place 5,000 mcg under the tongue daily.     dapagliflozin propanediol (FARXIGA) 10 MG TABS tablet Take 1 tablet (10 mg total) by mouth daily. Patient receives through [place]  Patient Assistance 90 tablet 3   fluticasone (FLONASE) 50 MCG/ACT nasal spray Place 2 sprays into both nostrils daily. 16 g 0   furosemide (LASIX) 20 MG tablet Take 1 tablet (20 mg total) by mouth daily as needed for edema. 30 tablet 3   glipiZIDE (GLUCOTROL XL) 5 MG 24 hr tablet TAKE 1 TABLET BY MOUTH DAILY 90 tablet 3   glucose blood (ONETOUCH VERIO) test strip Use as instructed; Check blood sugar twice daily 100 each 12   guaiFENesin (MUCINEX) 600 MG 12 hr tablet Take 1 tablet (600 mg total) by mouth 2 (two) times daily. 30 tablet 0   lisinopril-hydrochlorothiazide (ZESTORETIC) 20-12.5 MG tablet Take 1 tablet by mouth every morning. 90 tablet 1   metoprolol succinate (TOPROL-XL) 100 MG 24 hr tablet TAKE 1 TABLET BY MOUTH DAILY  WITH OR IMMEDIATELY FOLLOWING A MEAL 90 tablet 1   sitaGLIPtin-metformin ([name]) 50-1000 MG tablet Take 1 tablet by mouth 2 (two) times daily with a meal. Patient receives through [name] Patient Assistance through Mar 2023. 180 tablet 0   tamoxifen (NOLVADEX) 20 MG tablet TAKE 1 TABLET(20 MG) BY MOUTH DAILY 90 tablet 3   benzonatate (TESSALON) 100 MG capsule Take 1 capsule (100 mg total) by mouth 3 (three) times daily as needed. (Patient not taking: Reported on 05/11/2021) 30 capsule 0   gatifloxacin (ZYMAXID) 0.5 % SOLN SMARTSIG:In Eye(s) (Patient not taking: Reported on 05/11/2021)     Magnesium 500 MG CAPS Take 1 capsule (500 mg total) by mouth at bedtime. 30 capsule 5   prednisoLONE acetate (PRED FORTE) 1 % ophthalmic suspension SMARTSIG:In Eye(s) (Patient not taking: Reported on 05/11/2021)     No current facility-administered medications for this visit.  OBJECTIVE: Vitals:   05/11/21 1057  BP: 117/79  Pulse: (!) 54  Resp: 18  SpO2: 100%     Body mass index is 33.83 kg/m.    ECOG FS:0 - Asymptomatic  General: Well-developed, well-nourished, no acute distress.  Sitting in a wheelchair. Eyes: Pink conjunctiva, anicteric sclera. HEENT: Normocephalic, moist mucous membranes. Breast: Exam deferred today. Lungs: No audible wheezing or coughing. Heart: Regular rate and rhythm. Abdomen: Soft, nontender, no obvious distention. Musculoskeletal: No edema, cyanosis, or clubbing. Neuro: Alert, answering all questions appropriately. Cranial nerves grossly intact. Skin: No rashes or petechiae noted. Psych: Normal affect.   LAB RESULTS:  Lab Results  Component Value Date   NA 135 02/19/2020   K 4.5 02/19/2020   CL 100 02/19/2020   CO2 21 02/19/2020   GLUCOSE 170 (H) 02/19/2020   BUN 19 02/19/2020   CREATININE 1.20 (H) 02/19/2020   CALCIUM 8.9 02/19/2020   PROT 6.6 02/19/2020   ALBUMIN 3.9 02/19/2020   AST 15 02/19/2020   ALT 8 02/19/2020   ALKPHOS 53 02/19/2020   BILITOT 0.5  02/19/2020   GFRNONAA 45 (L) 02/19/2020   GFRAA 52 (L) 02/19/2020    Lab Results  Component Value Date   WBC 9.2 02/19/2020   NEUTROABS 6.1 02/19/2020   HGB 11.7 02/19/2020   HCT 35.5 02/19/2020   MCV 89 02/19/2020   PLT 200 02/19/2020     STUDIES: No results found.  ASSESSMENT: Right breast high grade DCIS.  PLAN:    1. Right breast high grade DCIS: Final pathology was reviewed independently as also discussed at breast case conference. Patient had close margins less than 0.5 mm and was given the option for reexcision to obtain better margins or proceed directly with XRT. She declined reexcision and subsequently underwent adjuvant XRT which she completed in May 2018.  Continue tamoxifen for total 5 years completing treatment in May 2023.  She has been instructed to refill her prescription with enough medication through that time and then discontinue.  Her most recent mammogram on June 29, 2020 was reported as BI-RADS 1.  Repeat in February 2023.  Return to clinic in 6 months for routine evaluation at which point patient could possibly be discharged from clinic.  2.  Hypertension: Patient's blood pressure is within normal limits today.    I spent a total of 20 current minutes reviewing chart data, face-to-face evaluation with the patient, counseling and coordination of care as detailed above.    Patient expressed understanding and was in agreement with this plan. She also understands that She can call clinic at any time with any questions, concerns, or complaints.    Cancer Staging  Ductal carcinoma in situ (DCIS) of right breast Staging form: Breast, AJCC 8th Edition - Pathologic stage from 08/02/2016: Stage 0 (pTis (DCIS), pN0, cM0, ER+, PR+, HER2-) - Signed by Lloyd Huger, MD on 08/02/2016 Neoadjuvant therapy: No Nuclear grade: G3 Laterality: Right   Lloyd Huger, MD   05/11/2021 12:31 PM

## 2021-05-11 ENCOUNTER — Inpatient Hospital Stay: Payer: Medicare Other | Attending: Oncology | Admitting: Oncology

## 2021-05-11 ENCOUNTER — Other Ambulatory Visit: Payer: Self-pay

## 2021-05-11 VITALS — BP 117/79 | HR 54 | Resp 18 | Wt 191.0 lb

## 2021-05-11 DIAGNOSIS — Z801 Family history of malignant neoplasm of trachea, bronchus and lung: Secondary | ICD-10-CM | POA: Insufficient documentation

## 2021-05-11 DIAGNOSIS — I10 Essential (primary) hypertension: Secondary | ICD-10-CM | POA: Diagnosis not present

## 2021-05-11 DIAGNOSIS — Z853 Personal history of malignant neoplasm of breast: Secondary | ICD-10-CM

## 2021-05-11 DIAGNOSIS — E119 Type 2 diabetes mellitus without complications: Secondary | ICD-10-CM | POA: Diagnosis not present

## 2021-05-11 DIAGNOSIS — D0511 Intraductal carcinoma in situ of right breast: Secondary | ICD-10-CM | POA: Diagnosis not present

## 2021-05-11 DIAGNOSIS — Z1231 Encounter for screening mammogram for malignant neoplasm of breast: Secondary | ICD-10-CM | POA: Diagnosis not present

## 2021-05-11 DIAGNOSIS — Z170 Estrogen receptor positive status [ER+]: Secondary | ICD-10-CM | POA: Diagnosis not present

## 2021-05-11 DIAGNOSIS — Z808 Family history of malignant neoplasm of other organs or systems: Secondary | ICD-10-CM | POA: Diagnosis not present

## 2021-05-12 DIAGNOSIS — M5416 Radiculopathy, lumbar region: Secondary | ICD-10-CM | POA: Diagnosis not present

## 2021-05-12 DIAGNOSIS — M25552 Pain in left hip: Secondary | ICD-10-CM | POA: Diagnosis not present

## 2021-05-12 DIAGNOSIS — M5136 Other intervertebral disc degeneration, lumbar region: Secondary | ICD-10-CM | POA: Diagnosis not present

## 2021-05-12 DIAGNOSIS — M7918 Myalgia, other site: Secondary | ICD-10-CM | POA: Diagnosis not present

## 2021-05-12 DIAGNOSIS — M9905 Segmental and somatic dysfunction of pelvic region: Secondary | ICD-10-CM | POA: Diagnosis not present

## 2021-05-12 DIAGNOSIS — M5451 Vertebrogenic low back pain: Secondary | ICD-10-CM | POA: Diagnosis not present

## 2021-05-12 DIAGNOSIS — M9903 Segmental and somatic dysfunction of lumbar region: Secondary | ICD-10-CM | POA: Diagnosis not present

## 2021-05-17 ENCOUNTER — Ambulatory Visit (INDEPENDENT_AMBULATORY_CARE_PROVIDER_SITE_OTHER): Payer: Medicare Other

## 2021-05-17 DIAGNOSIS — N1831 Chronic kidney disease, stage 3a: Secondary | ICD-10-CM

## 2021-05-17 DIAGNOSIS — I10 Essential (primary) hypertension: Secondary | ICD-10-CM

## 2021-05-17 DIAGNOSIS — E1122 Type 2 diabetes mellitus with diabetic chronic kidney disease: Secondary | ICD-10-CM

## 2021-05-17 NOTE — Patient Instructions (Signed)
Thank you for allowing the Chronic Care Management team to participate in your care. It was great speaking with you today! °

## 2021-05-17 NOTE — Chronic Care Management (AMB) (Signed)
Chronic Care Management   CCM RN Visit Note  05/17/2021 Name: Carol Ramsey MRN: [id number] DOB: [birth year]  Subjective: Carol Ramsey is a 76 y.o. year old female who is a primary care patient of Jerrol Banana., MD. The care management team was consulted for assistance with disease management and care coordination needs.    Engaged with patient by telephone for follow up visit in response to provider referral for case management and care coordination services.   Consent to Services:  The patient was given information about Chronic Care Management services, agreed to services, and gave verbal consent prior to initiation of services.  Please see initial visit note for detailed documentation.   Assessment: Review of patient past medical history, allergies, medications, health status, including review of consultants reports, laboratory and other test data, was performed as part of comprehensive evaluation and provision of chronic care management services.   SDOH (Social Determinants of Health) assessments and interventions performed: No   CCM Care Plan  Allergies  Allergen Reactions   Gabapentin Swelling   Quinine Nausea Only and Nausea And Vomiting   Amoxicillin Itching and Rash    Has patient had a PCN reaction causing immediate rash, facial/tongue/throat swelling, SOB or lightheadedness with hypotension: No Has patient had a PCN reaction causing severe rash involving mucus membranes or skin necrosis: No Has patient had a PCN reaction that required hospitalization No Has patient had a PCN reaction occurring within the last 10 years: No If all of the above answers are "NO", then may proceed with Cephalosporin use.    Dilaudid  [Hydromorphone Hcl] Rash   Etodolac Rash   Hydrochlorothiazide Rash   Hydrocodone Rash   Hydromorphone Rash   Sulfa Antibiotics Rash    Outpatient Encounter Medications as of 05/17/2021  Medication Sig   acetaminophen (TYLENOL) 500 MG tablet Take  500 mg by mouth every 6 (six) hours as needed for mild pain or moderate pain.   ascorbic acid (VITAMIN C) 500 MG tablet Take 500 mg by mouth daily.   atorvastatin (LIPITOR) 10 MG tablet Take 10 mg by mouth daily at 6 PM.    benzonatate (TESSALON) 100 MG capsule Take 1 capsule (100 mg total) by mouth 3 (three) times daily as needed. (Patient not taking: Reported on 05/11/2021)   Cholecalciferol (VITAMIN D) 125 MCG (5000 UT) CAPS Take 5,000 Units by mouth daily.   Cyanocobalamin (VITAMIN B-12) 5000 MCG SUBL Place 5,000 mcg under the tongue daily.   dapagliflozin propanediol (FARXIGA) 10 MG TABS tablet Take 1 tablet (10 mg total) by mouth daily. Patient [place] through [place]  Patient Assistance   fluticasone (FLONASE) 50 MCG/ACT nasal spray Place 2 sprays into both nostrils daily.   furosemide (LASIX) 20 MG tablet Take 1 tablet (20 mg total) by mouth daily as needed for edema.   gatifloxacin (ZYMAXID) 0.5 % SOLN SMARTSIG:In Eye(s) (Patient not taking: Reported on 05/11/2021)   glipiZIDE (GLUCOTROL XL) 5 MG 24 hr tablet TAKE 1 TABLET BY MOUTH DAILY   glucose blood (ONETOUCH VERIO) test strip Use as instructed; Check blood sugar twice daily   guaiFENesin (MUCINEX) 600 MG 12 hr tablet Take 1 tablet (600 mg total) by mouth 2 (two) times daily. (Patient not taking: Reported on 05/17/2021)   lisinopril-hydrochlorothiazide (ZESTORETIC) 20-12.5 MG tablet Take 1 tablet by mouth every morning.   Magnesium 500 MG CAPS Take 1 capsule (500 mg total) by mouth at bedtime.   metoprolol succinate (TOPROL-XL) 100 MG 24 hr tablet TAKE  1 TABLET BY MOUTH DAILY WITH OR IMMEDIATELY FOLLOWING A MEAL   prednisoLONE acetate (PRED FORTE) 1 % ophthalmic suspension SMARTSIG:In Eye(s) (Patient not taking: Reported on 05/11/2021)   sitaGLIPtin-metformin ([name]) 50-1000 MG tablet Take 1 tablet by mouth 2 (two) times daily with a meal. Patient receives through [name] Patient Assistance through Mar 2023.   tamoxifen (NOLVADEX) 20 MG  tablet TAKE 1 TABLET(20 MG) BY MOUTH DAILY   No facility-administered encounter medications on file as of 05/17/2021.    Patient Active Problem List   Diagnosis Date Noted   OA (osteoarthritis) of knee 10/30/2018   Lymphedema 10/30/2018   DDD (degenerative disc disease), lumbar 02/28/2018   Osteoarthritis of sacroiliac joint (Left) 02/28/2018   Somatic dysfunction of sacroiliac joint (Left) 02/28/2018   Sacroiliac joint dysfunction (Left) 02/28/2018   Abnormal MRI, lumbar spine (07/22/2013) 02/28/2018   Lumbar facet syndrome (Bilateral) 02/28/2018   Lumbar spondylosis 02/28/2018   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) 02/28/2018   Lumbar facet arthropathy (Bilateral) 02/28/2018   Lumbar intervertebral disc protrusion 02/28/2018   Lumbar nerve root compression 02/28/2018   Morbid obesity with BMI of 40.0-44.9, adult ([place]) 02/27/2018   Hypoalbuminemia 02/27/2018   Hypomagnesemia 02/27/2018   Vitamin B 12 deficiency 02/27/2018   Low serum vitamin B12 02/06/2018   Morbid obesity ([place]) 02/01/2018   Chronic low back pain (Primary Area of Pain) (Bilateral) w/ sciatica (Left) 02/01/2018   Chronic lower extremity pain (Secondary Area of Pain) (Left) 02/01/2018   Chronic pain syndrome 02/01/2018   Pharmacologic therapy 02/01/2018   Disorder of skeletal system 02/01/2018   Problems influencing health status 02/01/2018   Chronic sacroiliac joint pain (Left) 02/01/2018   Bilateral leg edema 02/21/2017   CKD (chronic kidney disease) stage 3, GFR 30-59 ml/min ([name]) 07/21/2016   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   LVH (left ventricular hypertrophy) due to hypertensive disease, without heart failure 01/19/2016   Pulmonary hypertension ([place]) 02/10/2015   Acquired hypothyroidism 09/17/2014   Adaptation reaction 09/17/2014   Absolute anemia 09/17/2014   Cardiac dysrhythmia 09/17/2014   Lumbar facet hypertrophy 09/17/2014   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  09/17/2014   Type 2 diabetes mellitus with stage 3a chronic kidney disease, without long-term current use of insulin ([place]) 09/17/2014   Essential (primary) hypertension 09/17/2014   HLD (hyperlipidemia) 09/17/2014   Extreme obesity 09/17/2014   Vitamin D insufficiency 09/17/2014   Beat, premature ventricular 12/11/2013   OBESITY 11/14/2007   Achilles bursitis or tendinitis 11/14/2007   Calcaneal bursitis (heel), unspecified laterality 06/26/2007     Patient Care Plan: RN Care Management Plan of Care     Problem Identified: DM and HTN      Long-Range Goal: Disease Progression Prevented or Minimized   Start Date: 02/23/2021  Expected End Date: 05/24/2021  Priority: High  Note:   Current Barriers:  Chronic Disease Management support and education needs related to HTN and DMII   RNCM Clinical Goal(s):  Patient will demonstrate ongoing adherence to prescribed treatment plan for HTN and DMII through collaboration with the provider, CCM Pharmacist and RN Care Manager.  Interventions: 1:1 collaboration with primary care provider regarding development and update of comprehensive plan of care as evidenced by provider attestation and co-signature Inter-disciplinary care team collaboration (see longitudinal plan of care) Evaluation of current treatment plan related to  self management and patient's adherence to plan as established by provider   Diabetes Interventions:   Assessed patient's understanding of A1c  goal: <7% Reviewed plan for diabetes management. Reports taking medications as prescribed. Agreed to keep the CCM Pharmacist updated of concerns regarding prescription cost. Reviewed blood glucose readings. Reports readings have been within range. Denies episodes of hypoglycemia or hyperglycemia. Attempting to comply with the recommended ADA/modified carb diet. Reports completing required diabetic exams. Denies need for additional diabetic resources.   Hypertension Interventions:    Last practice recorded BP readings:  BP Readings from Last 3 Encounters:  05/11/21 117/79  02/04/21 (!) 133/46  12/28/20 (!) 125/94  Most recent eGFR/CrCl: No results found for: EGFR  No components found for: CRCL Reviewed plan for hypertension management. Reports excellent compliance with medications. Tolerating regimen without concerns. Reviewed established BP ranges. Report readings have been within range. Reports lower extremity edema has been well controlled with prescribed diuretic. Discussed activity and nutritional intake. Reports brief period of decreased activity and appetite last month due to Covid. Reports symptoms have resolved. Currently doing well. Reviewed s/sx of heart attack, stroke and worsening symptoms that require immediate medical attention.    Patient Goals/Self-Care Activities: Take all medications as prescribed Call pharmacy for medication refills 3-7 days in advance of running out of medications Call provider office for new concerns or questions  Attend PCP appointment and complete labs on 06/07/21 as scheduled         PLAN: Mrs. Kempa is scheduled to complete a PCP/clinic appointment on 06/07/21. Agreed to call or notify the clinic if she requires additional nursing outreach.   [name] Care Management [facility] [phone]

## 2021-05-20 DIAGNOSIS — D485 Neoplasm of uncertain behavior of skin: Secondary | ICD-10-CM | POA: Diagnosis not present

## 2021-05-20 DIAGNOSIS — D0439 Carcinoma in situ of skin of other parts of face: Secondary | ICD-10-CM | POA: Diagnosis not present

## 2021-05-20 DIAGNOSIS — L304 Erythema intertrigo: Secondary | ICD-10-CM | POA: Diagnosis not present

## 2021-05-20 DIAGNOSIS — Z85828 Personal history of other malignant neoplasm of skin: Secondary | ICD-10-CM | POA: Diagnosis not present

## 2021-05-20 DIAGNOSIS — X32XXXA Exposure to sunlight, initial encounter: Secondary | ICD-10-CM | POA: Diagnosis not present

## 2021-05-20 DIAGNOSIS — L821 Other seborrheic keratosis: Secondary | ICD-10-CM | POA: Diagnosis not present

## 2021-05-20 DIAGNOSIS — L570 Actinic keratosis: Secondary | ICD-10-CM | POA: Diagnosis not present

## 2021-05-20 DIAGNOSIS — B351 Tinea unguium: Secondary | ICD-10-CM | POA: Diagnosis not present

## 2021-06-01 DIAGNOSIS — Z7984 Long term (current) use of oral hypoglycemic drugs: Secondary | ICD-10-CM

## 2021-06-01 DIAGNOSIS — I10 Essential (primary) hypertension: Secondary | ICD-10-CM

## 2021-06-01 DIAGNOSIS — E1159 Type 2 diabetes mellitus with other circulatory complications: Secondary | ICD-10-CM

## 2021-06-04 DIAGNOSIS — Z20822 Contact with and (suspected) exposure to covid-19: Secondary | ICD-10-CM | POA: Diagnosis not present

## 2021-06-07 ENCOUNTER — Ambulatory Visit (INDEPENDENT_AMBULATORY_CARE_PROVIDER_SITE_OTHER): Payer: Medicare Other | Admitting: Family Medicine

## 2021-06-07 ENCOUNTER — Other Ambulatory Visit: Payer: Self-pay

## 2021-06-07 ENCOUNTER — Ambulatory Visit (INDEPENDENT_AMBULATORY_CARE_PROVIDER_SITE_OTHER): Payer: Medicare Other

## 2021-06-07 ENCOUNTER — Encounter: Payer: Self-pay | Admitting: Family Medicine

## 2021-06-07 VITALS — BP 120/64 | HR 65 | Temp 98.2°F | Ht 63.0 in | Wt 189.3 lb

## 2021-06-07 DIAGNOSIS — M5442 Lumbago with sciatica, left side: Secondary | ICD-10-CM

## 2021-06-07 DIAGNOSIS — I10 Essential (primary) hypertension: Secondary | ICD-10-CM | POA: Diagnosis not present

## 2021-06-07 DIAGNOSIS — G8929 Other chronic pain: Secondary | ICD-10-CM

## 2021-06-07 DIAGNOSIS — M461 Sacroiliitis, not elsewhere classified: Secondary | ICD-10-CM | POA: Diagnosis not present

## 2021-06-07 DIAGNOSIS — E7800 Pure hypercholesterolemia, unspecified: Secondary | ICD-10-CM

## 2021-06-07 DIAGNOSIS — I129 Hypertensive chronic kidney disease with stage 1 through stage 4 chronic kidney disease, or unspecified chronic kidney disease: Secondary | ICD-10-CM | POA: Diagnosis not present

## 2021-06-07 DIAGNOSIS — N1831 Chronic kidney disease, stage 3a: Secondary | ICD-10-CM | POA: Diagnosis not present

## 2021-06-07 DIAGNOSIS — E039 Hypothyroidism, unspecified: Secondary | ICD-10-CM | POA: Diagnosis not present

## 2021-06-07 DIAGNOSIS — E1122 Type 2 diabetes mellitus with diabetic chronic kidney disease: Secondary | ICD-10-CM

## 2021-06-07 DIAGNOSIS — D0511 Intraductal carcinoma in situ of right breast: Secondary | ICD-10-CM

## 2021-06-07 DIAGNOSIS — Z0000 Encounter for general adult medical examination without abnormal findings: Secondary | ICD-10-CM | POA: Diagnosis not present

## 2021-06-07 DIAGNOSIS — I890 Lymphedema, not elsewhere classified: Secondary | ICD-10-CM | POA: Diagnosis not present

## 2021-06-07 NOTE — Progress Notes (Signed)
Established patient visit  I,Carol Ramsey,acting as a scribe for [name], MD.,have documented all relevant documentation on the behalf of [name], MD,as directed by  [name], MD while in the presence of [name], MD.   Patient: Carol Ramsey   DOB: [birth year]   75 y.o. Female  MRN: [id number] Visit Date: 06/07/2021  Today's healthcare provider: [name], MD   Chief Complaint  Patient presents with   Follow-up   Diabetes   Hypertension   Hyperlipidemia   Hypothyroidism   Subjective    HPI   Patient had AWV with [facility] today at 10:20 am. Patient comes in today for follow-up.  She had COVID over Christmas but other than fatigue is recovering nicely.  She continues to have chronic back pain.  She is trying to work on some weight loss.  She now walks with a cane.  She has no hypoglycemia no chest pain no shortness of breath. She denies any falls. Diabetes Mellitus Type II, follow-up  Lab Results  Component Value Date   HGBA1C 7.8 (A) 02/12/2021   HGBA1C 9.0 (A) 11/05/2020   HGBA1C 11.1 (A) 06/24/2020   Last seen for diabetes 4 months ago.  Management since then includes; A1c improved from 9.0-7.8.  Well on Farxiga glipizide [name].  Follow-up 6 months. She reports good compliance with treatment. She is not having side effects. none  Home blood sugar records: fasting range: 97-150  Episodes of hypoglycemia? No none   Current insulin regiment: n/a Most Recent Eye Exam: 12/31/2020  --------------------------------------------------------------------------------------------------- Hypertension, follow-up  BP Readings from Last 3 Encounters:  06/07/21 120/64  05/11/21 117/79  02/04/21 (!) 133/46   Wt Readings from Last 3 Encounters:  06/07/21 189 lb 4.8 oz (85.9 kg)  05/11/21 191 lb (86.6 kg)  02/04/21 198 lb (89.8 kg)     She was last seen for hypertension 4 months ago.  BP at that visit was 133/46. Management  since that visit includes; Good control on lisinopril-HCTZ. She reports good compliance with treatment. She is not having side effects. none She is not exercising. She is adherent to low salt diet.   Outside blood pressures are normal.  She does not smoke.  Use of agents associated with hypertension: none.   --------------------------------------------------------------------------------------------------- Lipid/Cholesterol, follow-up  Last Lipid Panel: Lab Results  Component Value Date   CHOL 121 02/19/2020   LDLCALC 42 02/19/2020   HDL 61 02/19/2020   TRIG 97 02/19/2020    She was last seen for this 02/19/2020.  Management since that visit includes; taking atorvastatin.  She reports good compliance with treatment. She is not having side effects. none  She is following a Regular diet. Current exercise: none  Last metabolic panel Lab Results  Component Value Date   GLUCOSE 170 (H) 02/19/2020   NA 135 02/19/2020   K 4.5 02/19/2020   BUN 19 02/19/2020   CREATININE 1.20 (H) 02/19/2020   GFRNONAA 45 (L) 02/19/2020   CALCIUM 8.9 02/19/2020   AST 15 02/19/2020   ALT 8 02/19/2020   The ASCVD Risk score (Arnett DK, et al., 2019) failed to calculate for the following reasons:   The valid total cholesterol range is 130 to 320 mg/dL  --------------------------------------------------------------------------------------------------- Hypothyroid, follow-up  Lab Results  Component Value Date   TSH 4.800 (H) 02/19/2020   TSH 3.360 01/29/2019   TSH 4.070 09/18/2017    Wt Readings from Last 3 Encounters:  06/07/21 189 lb  4.8 oz (85.9 kg)  05/11/21 191 lb (86.6 kg)  02/04/21 198 lb (89.8 kg)    She was last seen for hypothyroid 02/19/2020.  Management since that visit includes; labs checked showing-stable. She reports good compliance with treatment. She is not having side effects.  none  -----------------------------------------------------------------------------------------   Medications: Outpatient Medications Prior to Visit  Medication Sig   acetaminophen (TYLENOL) 500 MG tablet Take 500 mg by mouth every 6 (six) hours as needed for mild pain or moderate pain.   ascorbic acid (VITAMIN C) 500 MG tablet Take 500 mg by mouth daily.   atorvastatin (LIPITOR) 10 MG tablet Take 10 mg by mouth daily at 6 PM.    benzonatate (TESSALON) 100 MG capsule Take 1 capsule (100 mg total) by mouth 3 (three) times daily as needed. (Patient not taking: Reported on 05/11/2021)   Cholecalciferol (VITAMIN D) 125 MCG (5000 UT) CAPS Take 5,000 Units by mouth daily.   Cyanocobalamin (VITAMIN B-12) 5000 MCG SUBL Place 5,000 mcg under the tongue daily.   dapagliflozin propanediol (FARXIGA) 10 MG TABS tablet Take 1 tablet (10 mg total) by mouth daily. Patient receives through [place]  Patient Assistance   fluticasone (FLONASE) 50 MCG/ACT nasal spray Place 2 sprays into both nostrils daily. (Patient not taking: Reported on 06/07/2021)   furosemide (LASIX) 20 MG tablet Take 1 tablet (20 mg total) by mouth daily as needed for edema.   glipiZIDE (GLUCOTROL XL) 5 MG 24 hr tablet TAKE 1 TABLET BY MOUTH DAILY   glucose blood (ONETOUCH VERIO) test strip Use as instructed; Check blood sugar twice daily   guaiFENesin (MUCINEX) 600 MG 12 hr tablet Take 1 tablet (600 mg total) by mouth 2 (two) times daily. (Patient not taking: Reported on 05/17/2021)   lisinopril-hydrochlorothiazide (ZESTORETIC) 20-12.5 MG tablet Take 1 tablet by mouth every morning.   Magnesium 500 MG CAPS Take 1 capsule (500 mg total) by mouth at bedtime.   metoprolol succinate (TOPROL-XL) 100 MG 24 hr tablet TAKE 1 TABLET BY MOUTH DAILY WITH OR IMMEDIATELY FOLLOWING A MEAL   prednisoLONE acetate (PRED FORTE) 1 % ophthalmic suspension    sitaGLIPtin-metformin ([name]) 50-1000 MG tablet Take 1 tablet by mouth 2 (two) times daily with a meal.  Patient receives through [name] Patient Assistance through Mar 2023.   tamoxifen (NOLVADEX) 20 MG tablet TAKE 1 TABLET(20 MG) BY MOUTH DAILY   triamcinolone cream (KENALOG) 0.1 % SMARTSIG:1 Application Topical 2-3 Times Daily (Patient not taking: Reported on 06/07/2021)   No facility-administered medications prior to visit.    Review of Systems  Constitutional:  Negative for appetite change, chills, fatigue and fever.  Respiratory:  Negative for chest tightness and shortness of breath.   Cardiovascular:  Negative for chest pain and palpitations.  Gastrointestinal:  Negative for abdominal pain, nausea and vomiting.  Neurological:  Negative for dizziness and weakness.   Last hemoglobin A1c Lab Results  Component Value Date   HGBA1C 7.8 (A) 02/12/2021       Objective     Vitals:  BP 120/64 Pulse 65 Temp 98.2 F (36.8 C) (Oral) Ht 5\' 3"  (1.6 m) Wt 189 lb 4.8 oz (85.9 kg) SpO2 96% BMI 33.53 kg/m BSA 1.95 m    BP Readings from Last 3 Encounters:  06/07/21 120/64  05/11/21 117/79  02/04/21 (!) 133/46   Wt Readings from Last 3 Encounters:  06/07/21 189 lb 4.8 oz (85.9 kg)  05/11/21 191 lb (86.6 kg)  02/04/21 198 lb (89.8 kg)  Physical Exam Vitals and nursing note reviewed.  Constitutional:      Appearance: She is obese.  HENT:     Right Ear: Tympanic membrane normal.     Left Ear: Tympanic membrane normal.     Nose: Nose normal.     Mouth/Throat:     Mouth: Mucous membranes are moist.     Pharynx: Oropharynx is clear.  Eyes:     Conjunctiva/sclera: Conjunctivae normal.  Cardiovascular:     Rate and Rhythm: Normal rate and regular rhythm.     Pulses: Normal pulses.     Heart sounds: Normal heart sounds.  Pulmonary:     Effort: Pulmonary effort is normal.     Breath sounds: Normal breath sounds.  Abdominal:     General: Bowel sounds are normal.     Palpations: Abdomen is soft.  Musculoskeletal:     Cervical back: Normal range of motion and neck supple.      Comments: Lymphedema only today.  Skin:    General: Skin is warm and dry.     Comments: Very fair skin.  Hair on the top of her head is thinned out greatly  Neurological:     General: No focal deficit present.     Mental Status: She is alert and oriented to person, place, and time.  Psychiatric:        Mood and Affect: Mood normal.        Behavior: Behavior normal.        Thought Content: Thought content normal.        Judgment: Judgment normal.      No results found for any visits on 06/07/21.  Assessment & Plan     1. Type 2 diabetes mellitus with stage 3a chronic kidney disease, without long-term current use of insulin ([name]) Has been under good control and patient having no hypoglycemia. - Lipid panel - TSH - CBC w/Diff/Platelet - Comprehensive Metabolic Panel (CMET) - Hemoglobin A1c  2. Essential (primary) hypertension Excellent control today. - Lipid panel - TSH - CBC w/Diff/Platelet - Comprehensive Metabolic Panel (CMET) - Hemoglobin A1c  3. Acquired hypothyroidism  - Lipid panel - TSH - CBC w/Diff/Platelet - Comprehensive Metabolic Panel (CMET) - Hemoglobin A1c  4. Pure hypercholesterolemia On atorvastatin - Lipid panel - TSH - CBC w/Diff/Platelet - Comprehensive Metabolic Panel (CMET) - Hemoglobin A1c  5. Chronic low back pain (Primary Area of Pain) (Bilateral) w/ sciatica (Left) Patient no longer followed at pain clinic as it was closed.  She is considering going to a different 1.  6. Morbid obesity ([place]) Patient has lost a little bit of weight.  I have encouraged her to do so and to continue to work on strengthening her core  7. Ductal carcinoma in situ (DCIS) of right breast Followed by Dr. Grayland Ormond from oncology, yearly mammogram  8. Osteoarthritis of left sacroiliac joint ([place])   9. Lymphedema   10. Stage 3a chronic kidney disease ([place]) On Farxiga 10   Return in about 4 months (around 10/05/2021).      I, [name], MD,  have reviewed all documentation for this visit. The documentation on 06/07/21 for the exam, diagnosis, procedures, and orders are all accurate and complete.    Kapena Hamme Cranford Mon, MD  [facility] [phone] (phone) [phone] (fax)  [place]

## 2021-06-07 NOTE — Patient Instructions (Signed)
Carol Ramsey , Thank you for taking time to come for your Medicare Wellness Visit. I appreciate your ongoing commitment to your health goals. Please review the following plan we discussed and let me know if I can assist you in the future.   Screening recommendations/referrals: Colonoscopy: 01/19/12 Mammogram: 06/30/21 Bone Density: 11/28/11, declined referral Recommended yearly ophthalmology/optometry visit for glaucoma screening and checkup Recommended yearly dental visit for hygiene and checkup  Vaccinations: Influenza vaccine: had at [facility] this season Pneumococcal vaccine: 04/13/15 Tdap vaccine: 05/11/16 Shingles vaccine: Zostavax 11/08/10   Covid-19:06/11/19, 07/02/19, 01/31/20  Advanced directives: no  Conditions/risks identified: none  Next appointment: Follow up in one year for your annual wellness visit 06/08/22 @ 1pm in person   Preventive Care 65 Years and Older, Female Preventive care refers to lifestyle choices and visits with your health care provider that can promote health and wellness. What does preventive care include? A yearly physical exam. This is also called an annual well check. Dental exams once or twice a year. Routine eye exams. Ask your health care provider how often you should have your eyes checked. Personal lifestyle choices, including: Daily care of your teeth and gums. Regular physical activity. Eating a healthy diet. Avoiding tobacco and drug use. Limiting alcohol use. Practicing safe sex. Taking low-dose aspirin every day. Taking vitamin and mineral supplements as recommended by your health care provider. What happens during an annual well check? The services and screenings done by your health care provider during your annual well check will depend on your age, overall health, lifestyle risk factors, and family history of disease. Counseling  Your health care provider may ask you questions about your: Alcohol use. Tobacco use. Drug use. Emotional  well-being. Home and relationship well-being. Sexual activity. Eating habits. History of falls. Memory and ability to understand (cognition). Work and work Statistician. Reproductive health. Screening  You may have the following tests or measurements: Height, weight, and BMI. Blood pressure. Lipid and cholesterol levels. These may be checked every 5 years, or more frequently if you are over 83 years old. Skin check. Lung cancer screening. You may have this screening every year starting at age 76 if you have a 30-pack-year history of smoking and currently smoke or have quit within the past 15 years. Fecal occult blood test (FOBT) of the stool. You may have this test every year starting at age 24. Flexible sigmoidoscopy or colonoscopy. You may have a sigmoidoscopy every 5 years or a colonoscopy every 10 years starting at age 54. Hepatitis C blood test. Hepatitis B blood test. Sexually transmitted disease (STD) testing. Diabetes screening. This is done by checking your blood sugar (glucose) after you have not eaten for a while (fasting). You may have this done every 1-3 years. Bone density scan. This is done to screen for osteoporosis. You may have this done starting at age 82. Mammogram. This may be done every 1-2 years. Talk to your health care provider about how often you should have regular mammograms. Talk with your health care provider about your test results, treatment options, and if necessary, the need for more tests. Vaccines  Your health care provider may recommend certain vaccines, such as: Influenza vaccine. This is recommended every year. Tetanus, diphtheria, and acellular pertussis (Tdap, Td) vaccine. You may need a Td booster every 10 years. Zoster vaccine. You may need this after age 41. Pneumococcal 13-valent conjugate (PCV13) vaccine. One dose is recommended after age 58. Pneumococcal polysaccharide (PPSV23) vaccine. One dose is recommended after age  103. Talk to your  health care provider about which screenings and vaccines you need and how often you need them. This information is not intended to replace advice given to you by your health care provider. Make sure you discuss any questions you have with your health care provider. Document Released: 05/15/2015 Document Revised: 01/06/2016 Document Reviewed: 02/17/2015 Elsevier Interactive Patient Education  2017 [place] Prevention in the Home Falls can cause injuries. They can happen to people of all ages. There are many things you can do to make your home safe and to help prevent falls. What can I do on the outside of my home? Regularly fix the edges of walkways and driveways and fix any cracks. Remove anything that might make you trip as you walk through a door, such as a raised step or threshold. Trim any bushes or trees on the path to your home. Use bright outdoor lighting. Clear any walking paths of anything that might make someone trip, such as rocks or tools. Regularly check to see if handrails are loose or broken. Make sure that both sides of any steps have handrails. Any raised decks and porches should have guardrails on the edges. Have any leaves, snow, or ice cleared regularly. Use sand or salt on walking paths during winter. Clean up any spills in your garage right away. This includes oil or grease spills. What can I do in the bathroom? Use night lights. Install grab bars by the toilet and in the tub and shower. Do not use towel bars as grab bars. Use non-skid mats or decals in the tub or shower. If you need to sit down in the shower, use a plastic, non-slip stool. Keep the floor dry. Clean up any water that spills on the floor as soon as it happens. Remove soap buildup in the tub or shower regularly. Attach bath mats securely with double-sided non-slip rug tape. Do not have throw rugs and other things on the floor that can make you trip. What can I do in the bedroom? Use night  lights. Make sure that you have a light by your bed that is easy to reach. Do not use any sheets or blankets that are too big for your bed. They should not hang down onto the floor. Have a firm chair that has side arms. You can use this for support while you get dressed. Do not have throw rugs and other things on the floor that can make you trip. What can I do in the kitchen? Clean up any spills right away. Avoid walking on wet floors. Keep items that you use a lot in easy-to-reach places. If you need to reach something above you, use a strong step stool that has a grab bar. Keep electrical cords out of the way. Do not use floor polish or wax that makes floors slippery. If you must use wax, use non-skid floor wax. Do not have throw rugs and other things on the floor that can make you trip. What can I do with my stairs? Do not leave any items on the stairs. Make sure that there are handrails on both sides of the stairs and use them. Fix handrails that are broken or loose. Make sure that handrails are as long as the stairways. Check any carpeting to make sure that it is firmly attached to the stairs. Fix any carpet that is loose or worn. Avoid having throw rugs at the top or bottom of the stairs. If you do have  throw rugs, attach them to the floor with carpet tape. Make sure that you have a light switch at the top of the stairs and the bottom of the stairs. If you do not have them, ask someone to add them for you. What else can I do to help prevent falls? Wear shoes that: Do not have high heels. Have rubber bottoms. Are comfortable and fit you well. Are closed at the toe. Do not wear sandals. If you use a stepladder: Make sure that it is fully opened. Do not climb a closed stepladder. Make sure that both sides of the stepladder are locked into place. Ask someone to hold it for you, if possible. Clearly mark and make sure that you can see: Any grab bars or handrails. First and last  steps. Where the edge of each step is. Use tools that help you move around (mobility aids) if they are needed. These include: Canes. Walkers. Scooters. Crutches. Turn on the lights when you go into a dark area. Replace any light bulbs as soon as they burn out. Set up your furniture so you have a clear path. Avoid moving your furniture around. If any of your floors are uneven, fix them. If there are any pets around you, be aware of where they are. Review your medicines with your doctor. Some medicines can make you feel dizzy. This can increase your chance of falling. Ask your doctor what other things that you can do to help prevent falls. This information is not intended to replace advice given to you by your health care provider. Make sure you discuss any questions you have with your health care provider. Document Released: 02/12/2009 Document Revised: 09/24/2015 Document Reviewed: 05/23/2014 Elsevier Interactive Patient Education  2017 [employer].

## 2021-06-07 NOTE — Progress Notes (Signed)
Subjective:   Carol Ramsey is a 76 y.o. female who presents for Medicare Annual (Subsequent) preventive examination.  Review of Systems           Objective:    Today's Vitals   06/07/21 1017  BP: 120/64  Pulse: 65  Temp: 98.2 F (36.8 C)  TempSrc: Oral  SpO2: 96%  Weight: 189 lb 4.8 oz (85.9 kg)  Height: 5\' 3"  (1.6 m)   Body mass index is 33.53 kg/m.  Advanced Directives 10/23/2020 06/01/2020 04/21/2020 11/26/2019 10/16/2019 04/16/2019 09/20/2018  Does Patient Have a Medical Advance Directive? No No No No No No No  Would patient like information on creating a medical advance directive? No - Patient declined No - Patient declined No - Patient declined No - Patient declined No - Patient declined No - Patient declined No - Patient declined    Current Medications (verified) Outpatient Encounter Medications as of 06/07/2021  Medication Sig   acetaminophen (TYLENOL) 500 MG tablet Take 500 mg by mouth every 6 (six) hours as needed for mild pain or moderate pain.   ascorbic acid (VITAMIN C) 500 MG tablet Take 500 mg by mouth daily.   atorvastatin (LIPITOR) 10 MG tablet Take 10 mg by mouth daily at 6 PM.    benzonatate (TESSALON) 100 MG capsule Take 1 capsule (100 mg total) by mouth 3 (three) times daily as needed. (Patient not taking: Reported on 05/11/2021)   Cholecalciferol (VITAMIN D) 125 MCG (5000 UT) CAPS Take 5,000 Units by mouth daily.   Cyanocobalamin (VITAMIN B-12) 5000 MCG SUBL Place 5,000 mcg under the tongue daily.   dapagliflozin propanediol (FARXIGA) 10 MG TABS tablet Take 1 tablet (10 mg total) by mouth daily. Patient receives through [place]  Patient Assistance   fluticasone (FLONASE) 50 MCG/ACT nasal spray Place 2 sprays into both nostrils daily.   furosemide (LASIX) 20 MG tablet Take 1 tablet (20 mg total) by mouth daily as needed for edema.   glipiZIDE (GLUCOTROL XL) 5 MG 24 hr tablet TAKE 1 TABLET BY MOUTH DAILY   glucose blood (ONETOUCH VERIO) test strip Use as  instructed; Check blood sugar twice daily   guaiFENesin (MUCINEX) 600 MG 12 hr tablet Take 1 tablet (600 mg total) by mouth 2 (two) times daily. (Patient not taking: Reported on 05/17/2021)   lisinopril-hydrochlorothiazide (ZESTORETIC) 20-12.5 MG tablet Take 1 tablet by mouth every morning.   Magnesium 500 MG CAPS Take 1 capsule (500 mg total) by mouth at bedtime.   metoprolol succinate (TOPROL-XL) 100 MG 24 hr tablet TAKE 1 TABLET BY MOUTH DAILY WITH OR IMMEDIATELY FOLLOWING A MEAL   prednisoLONE acetate (PRED FORTE) 1 % ophthalmic suspension SMARTSIG:In Eye(s) (Patient not taking: Reported on 05/11/2021)   sitaGLIPtin-metformin ([name]) 50-1000 MG tablet Take 1 tablet by mouth 2 (two) times daily with a meal. Patient receives through [name] Patient Assistance through Mar 2023.   tamoxifen (NOLVADEX) 20 MG tablet TAKE 1 TABLET(20 MG) BY MOUTH DAILY   triamcinolone cream (KENALOG) 0.1 % SMARTSIG:1 Application Topical 2-3 Times Daily   [DISCONTINUED] gatifloxacin (ZYMAXID) 0.5 % SOLN SMARTSIG:In Eye(s) (Patient not taking: Reported on 05/11/2021)   No facility-administered encounter medications on file as of 06/07/2021.    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   Diabetes mellitus without complication ([place])    Hyperlipidemia    Hypertension    Hyperthyroidism    Obesity    Personal history of radiation therapy    Right lumpectomy 2018   Thyroid disease    hyperthyroidism   Vitamin D deficiency    Past Surgical History:  Procedure Laterality Date   BREAST BIOPSY Right 06/28/2016   stereotactic biopsy - DUCTAL CARCINOMA IN SITU (DCIS), HIGH NUCLEAR GRADE  (very narrow margins)   BREAST  LUMPECTOMY Right 07/18/2016   DCIS   JOINT REPLACEMENT Left    tkr   KNEE SURGERY Left    arthroscopy   MASTECTOMY, PARTIAL Right 07/18/2016   Procedure: MASTECTOMY PARTIAL;  Surgeon: Robert Bellow, MD;  Location: [facility] ORS;  Service: General;  Laterality: Right;   SKIN CANCER EXCISION     face   Family History  Problem Relation Age of Onset   Hypertension Mother    Cervical cancer Mother    Heart disease Father    Hypertension Father    Lung cancer Father    Lung cancer Sister    Brain cancer Sister    Diabetes Brother    Heart disease Brother    Hypertension Brother    Stroke Brother    Breast cancer Neg Hx    Social History   Socioeconomic History   Marital status: Married    Spouse name: Not on file   Number of children: 1   Years of education: Not on file   Highest education level: Bachelor's degree (e.g., BA, AB, BS)  Occupational History   Occupation: retired  Tobacco Use   Smoking status: Never   Smokeless tobacco: Never  Vaping Use   Vaping Use: Never used  Substance and Sexual Activity   Alcohol use: No   Drug use: No   Sexual activity: Never  Other Topics Concern   Not on file  Social History Narrative   Not on file   Social Determinants of Health   Financial Resource Strain: Low Risk    Difficulty of Paying Living Expenses: Not hard at all  Food Insecurity: Not on file  Transportation Needs: No Transportation Needs   Lack of Transportation (Medical): No   Lack of Transportation (Non-Medical): No  Physical Activity: Not on file  Stress: Not on file  Social Connections: Not on file    Tobacco Counseling Counseling given: Not Answered   Clinical Intake:  Pre-visit preparation completed: Yes  Pain : No/denies pain     Diabetes: Yes CBG done?: No Did pt. bring in CBG monitor from home?: No  How often do you need to have someone help you when you read instructions, pamphlets, or other written materials from your doctor or  pharmacy?: 1 - Never  Diabetic?yes Nutrition Risk Assessment:  Has the patient had any N/V/D within the last 2 months?  Yes  Does the patient have any non-healing wounds?  No  Has the patient had any unintentional weight loss or weight gain?  No   Diabetes:  Is the patient diabetic?  Yes  If diabetic, was a CBG obtained today?  No  Did the patient bring in their glucometer from home?  No  How often do you monitor your CBG's? 2-3 times per week.   Financial Strains and Diabetes Management:  Are you having any financial strains with the device, your supplies or your medication? No .  Does the patient  want to be seen by Chronic Care Management for management of their diabetes?  No  Would the patient like to be referred to a Nutritionist or for Diabetic Management?  No   Diabetic Exams:  Diabetic Eye Exam: Completed 12/31/20.  Diabetic Foot Exam: Completed 01/26/17. Pt has been advised about the importance in completing this exam.   Interpreter Needed?: No  Information entered by :: Kirke Shaggy, LPN   Activities of Daily Living No flowsheet data found.  Patient Care Team: Jerrol Banana., MD as PCP - General (Family Medicine) Marry Guan, Laurice Record, MD as Consulting Physician (Orthopedic Surgery) Dasher, Rayvon Char, MD as Consulting Physician (Dermatology) Corey Skains, MD as Consulting Physician (Cardiology) Thelma Comp, [place] as Consulting Physician (Optometry) Byrnett, Forest Gleason, MD (General Surgery) Noreene Filbert, MD as Referring Physician (Radiation Oncology) Lloyd Huger, MD as Consulting Physician (Oncology) Germaine Pomfret, [facility] (Pharmacist)  Indicate any recent Medical Services you may have received from other than [facility] providers in the past year (date may be approximate).     Assessment:   This is a routine wellness examination for Mahli.  Hearing/Vision screen No results found.  Dietary issues and exercise activities discussed:     Goals  Addressed   None    Depression Screen PHQ 2/9 Scores 06/01/2020 04/02/2020 02/19/2020 05/30/2019 09/20/2018 02/28/2018 02/01/2018  PHQ - 2 Score 0 0 0 0 0 0 0  PHQ- 9 Score - - 0 0 - - -    Fall Risk Fall Risk  06/01/2020 02/19/2020 05/30/2019 09/20/2018 02/28/2018  Falls in the past year? 0 0 0 0 No  Number falls in past yr: 0 0 0 - -  Injury with Fall? 0 0 0 - -  Follow up - Falls evaluation completed - - -    FALL RISK PREVENTION PERTAINING TO THE HOME:  Any stairs in or around the home? Yes  If so, are there any without handrails? No  Home free of loose throw rugs in walkways, pet beds, electrical cords, etc? Yes  Adequate lighting in your home to reduce risk of falls? Yes   ASSISTIVE DEVICES UTILIZED TO PREVENT FALLS:  Life alert? Yes  Use of a cane, walker or w/c? Yes  Grab bars in the bathroom? Yes  Shower chair or bench in shower? Yes  Elevated toilet seat or a handicapped toilet? No   TIMED UP AND GO:  Was the test performed? Yes .  Length of time to ambulate 10 feet: 5 sec.   Gait slow and steady with assistive device  Cognitive Function:     6CIT Screen 05/30/2019 05/11/2016  What Year? 0 points 0 points  What month? 0 points 0 points  What time? 0 points 0 points  Count back from 20 0 points 0 points  Months in reverse 0 points 0 points  Repeat phrase 0 points 0 points  Total Score 0 0    Immunizations Immunization History  Administered Date(s) Administered   Fluad Quad(high Dose 65+) 01/29/2019, 01/30/2020   Influenza Split 02/25/2006, 03/07/2011, 02/27/2012   Influenza, High Dose Seasonal PF 03/03/2016, 01/26/2017, 01/08/2018   Influenza,inj,Quad PF,6+ Mos 03/04/2013   PFIZER(Purple Top)SARS-COV-2 Vaccination 06/11/2019, 07/02/2019, 01/31/2020   Pneumococcal Conjugate-13 04/13/2015   Pneumococcal Polysaccharide-23 02/17/2007, 02/27/2012   Td 10/21/2003   Tdap 05/11/2016   Zoster, Live 11/08/2010    TDAP status: Up to date  Flu Vaccine status:  Up to date  Pneumococcal vaccine status: Up to date  Covid-19  vaccine status: Completed vaccines  Qualifies for Shingles Vaccine? Yes   Zostavax completed Yes   Shingrix Completed?: No.    Education has been provided regarding the importance of this vaccine. Patient has been advised to call insurance company to determine out of pocket expense if they have not yet received this vaccine. Advised may also receive vaccine at local pharmacy or Health Dept. Verbalized acceptance and understanding.  Screening Tests Health Maintenance  Topic Date Due   Zoster Vaccines- Shingrix (1 of 2) Never done   DEXA SCAN  11/27/2016   FOOT EXAM  01/26/2018   COVID-19 Vaccine (4 - Booster for Pfizer series) 03/27/2020   INFLUENZA VACCINE  07/30/2021 (Originally 11/30/2020)   HEMOGLOBIN A1C  08/13/2021   OPHTHALMOLOGY EXAM  12/31/2021   COLONOSCOPY (Pts 45-57yrs Insurance coverage will need to be confirmed)  01/18/2022   MAMMOGRAM  06/29/2022   TETANUS/TDAP  05/11/2026   Pneumonia Vaccine 84+ Years old  Completed   Hepatitis C Screening  Completed   HPV VACCINES  Aged Out    Health Maintenance  Health Maintenance Due  Topic Date Due   Zoster Vaccines- Shingrix (1 of 2) Never done   DEXA SCAN  11/27/2016   FOOT EXAM  01/26/2018   COVID-19 Vaccine (4 - Booster for [place] series) 03/27/2020    Colorectal cancer screening: Type of screening: Colonoscopy. Completed 01/19/12. Repeat every 10 years  Mammogram status: Completed 06/30/21. Repeat every year- appt coming up 3/1  Bone Density status: Completed 11/28/11. Results reflect: Bone density results: NORMAL. Repeat every 5 years.- pt declined referral (can't lay on table)  Lung Cancer Screening: (Low Dose CT Chest recommended if Age 3-80 years, 30 pack-year currently smoking OR have quit w/in 15years.) does not qualify.     Additional Screening:  Hepatitis C Screening: does qualify; Completed 05/11/16  Vision Screening: Recommended annual  ophthalmology exams for early detection of glaucoma and other disorders of the eye. Is the patient up to date with their annual eye exam?  Yes  Who is the provider or what is the name of the office in which the patient attends annual eye exams? Dr.Bulakowski If pt is not established with a provider, would they like to be referred to a provider to establish care? No .   Dental Screening: Recommended annual dental exams for proper oral hygiene  Community Resource Referral / Chronic Care Management: CRR required this visit?  No   CCM required this visit?  No      Plan:     I have personally reviewed and noted the following in the patients chart:   Medical and social history Use of alcohol, tobacco or illicit drugs  Current medications and supplements including opioid prescriptions.  Functional ability and status Nutritional status Physical activity Advanced directives List of other physicians Hospitalizations, surgeries, and ER visits in previous 12 months Vitals Screenings to include cognitive, depression, and falls Referrals and appointments  In addition, I have reviewed and discussed with patient certain preventive protocols, quality metrics, and best practice recommendations. A written personalized care plan for preventive services as well as general preventive health recommendations were provided to patient.     Dionisio David, LPN   07/03/1959   Nurse Notes: none

## 2021-06-08 LAB — CBC WITH DIFFERENTIAL/PLATELET
Basophils Absolute: 0.1 10*3/uL (ref 0.0–0.2)
Basos: 1 %
EOS (ABSOLUTE): 0.3 10*3/uL (ref 0.0–0.4)
Eos: 4 %
Hematocrit: 34.9 % (ref 34.0–46.6)
Hemoglobin: 11.1 g/dL (ref 11.1–15.9)
Immature Grans (Abs): 0 10*3/uL (ref 0.0–0.1)
Immature Granulocytes: 0 %
Lymphocytes Absolute: 1.9 10*3/uL (ref 0.7–3.1)
Lymphs: 24 %
MCH: 27.8 pg (ref 26.6–33.0)
MCHC: 31.8 g/dL (ref 31.5–35.7)
MCV: 88 fL (ref 79–97)
Monocytes Absolute: 0.7 10*3/uL (ref 0.1–0.9)
Monocytes: 9 %
Neutrophils Absolute: 5.2 10*3/uL (ref 1.4–7.0)
Neutrophils: 62 %
Platelets: 223 10*3/uL (ref 150–450)
RBC: 3.99 x10E6/uL (ref 3.77–5.28)
RDW: 12.1 % (ref 11.7–15.4)
WBC: 8.2 10*3/uL (ref 3.4–10.8)

## 2021-06-08 LAB — LIPID PANEL
Chol/HDL Ratio: 2.2 ratio (ref 0.0–4.4)
Cholesterol, Total: 106 mg/dL (ref 100–199)
HDL: 48 mg/dL (ref >39)
LDL Chol Calc (NIH): 35 mg/dL (ref 0–99)
Triglycerides: 129 mg/dL (ref 0–149)
VLDL Cholesterol Cal: 23 mg/dL (ref 5–40)

## 2021-06-08 LAB — COMPREHENSIVE METABOLIC PANEL
ALT: 5 IU/L (ref 0–32)
AST: 16 IU/L (ref 0–40)
Albumin/Globulin Ratio: 1.6 (ref 1.2–2.2)
Albumin: 3.6 g/dL — ABNORMAL LOW (ref 3.7–4.7)
Alkaline Phosphatase: 31 IU/L — ABNORMAL LOW (ref 44–121)
BUN/Creatinine Ratio: 25 (ref 12–28)
BUN: 39 mg/dL — ABNORMAL HIGH (ref 8–27)
Bilirubin Total: 0.4 mg/dL (ref 0.0–1.2)
CO2: 23 mmol/L (ref 20–29)
Calcium: 9.2 mg/dL (ref 8.7–10.3)
Chloride: 102 mmol/L (ref 96–106)
Creatinine, Ser: 1.58 mg/dL — ABNORMAL HIGH (ref 0.57–1.00)
Globulin, Total: 2.3 g/dL (ref 1.5–4.5)
Glucose: 192 mg/dL — ABNORMAL HIGH (ref 70–99)
Potassium: 4.7 mmol/L (ref 3.5–5.2)
Sodium: 140 mmol/L (ref 134–144)
Total Protein: 5.9 g/dL — ABNORMAL LOW (ref 6.0–8.5)
eGFR: 34 mL/min/{1.73_m2} — ABNORMAL LOW (ref >59)

## 2021-06-08 LAB — HEMOGLOBIN A1C
Est. average glucose Bld gHb Est-mCnc: 237 mg/dL
Hgb A1c MFr Bld: 9.9 % — ABNORMAL HIGH (ref 4.8–5.6)

## 2021-06-08 LAB — TSH: TSH: 3.91 u[IU]/mL (ref 0.450–4.500)

## 2021-06-09 DIAGNOSIS — M5136 Other intervertebral disc degeneration, lumbar region: Secondary | ICD-10-CM | POA: Diagnosis not present

## 2021-06-09 DIAGNOSIS — M25552 Pain in left hip: Secondary | ICD-10-CM | POA: Diagnosis not present

## 2021-06-09 DIAGNOSIS — M5451 Vertebrogenic low back pain: Secondary | ICD-10-CM | POA: Diagnosis not present

## 2021-06-09 DIAGNOSIS — M9905 Segmental and somatic dysfunction of pelvic region: Secondary | ICD-10-CM | POA: Diagnosis not present

## 2021-06-09 DIAGNOSIS — M7918 Myalgia, other site: Secondary | ICD-10-CM | POA: Diagnosis not present

## 2021-06-09 DIAGNOSIS — M9903 Segmental and somatic dysfunction of lumbar region: Secondary | ICD-10-CM | POA: Diagnosis not present

## 2021-06-09 DIAGNOSIS — M5416 Radiculopathy, lumbar region: Secondary | ICD-10-CM | POA: Diagnosis not present

## 2021-06-21 ENCOUNTER — Telehealth: Payer: Self-pay

## 2021-06-21 NOTE — Telephone Encounter (Signed)
Copied from [place] [phone]. Topic: General - Other >> Jun 21, 2021 10:04 AM Pawlus, Brayton Layman A wrote: Reason for CRM: Pt stated she needs help completing paperwork regarding her medications and requested a call back from [place]. Please advise.

## 2021-06-21 NOTE — Progress Notes (Signed)
° ° °  Chronic Care Management Pharmacy Assistant   Name: Carol Ramsey  MRN: [id number] DOB: 09-11-1945  Returning patient's call/Patient Assistance Renewal Application for [name]  I received a message from Carol Ramsey, CPP regarding the patient had left a voice message requesting a return call. CPP requested I give her a call to see what the patient needed.  I spoke to the patient, and she is calling regarding her renewal application for her [name]. Patient stated that she is aware the application can be turned in in March. I advised the patient that I would start the application for her and she requested to pick the application up at the providers office on Friday. I informed her that I would get this taken care of and e-mail the application to the CPP so he can have it printed and waiting for her at the front desk. The patient verbalized understanding.   Application has been started for he patient's 2023 renewal for her [name] and e-mailed to CPP for printing as the patient will be in Friday to sign. CPP notified.   Medications: Outpatient Encounter Medications as of 06/21/2021  Medication Sig   acetaminophen (TYLENOL) 500 MG tablet Take 500 mg by mouth every 6 (six) hours as needed for mild pain or moderate pain.   ascorbic acid (VITAMIN C) 500 MG tablet Take 500 mg by mouth daily.   atorvastatin (LIPITOR) 10 MG tablet Take 10 mg by mouth daily at 6 PM.    benzonatate (TESSALON) 100 MG capsule Take 1 capsule (100 mg total) by mouth 3 (three) times daily as needed. (Patient not taking: Reported on 05/11/2021)   Cholecalciferol (VITAMIN D) 125 MCG (5000 UT) CAPS Take 5,000 Units by mouth daily.   Cyanocobalamin (VITAMIN B-12) 5000 MCG SUBL Place 5,000 mcg under the tongue daily.   dapagliflozin propanediol (FARXIGA) 10 MG TABS tablet Take 1 tablet (10 mg total) by mouth daily. Patient receives through [place]  Patient Assistance   fluticasone (FLONASE) 50 MCG/ACT nasal spray Place 2 sprays into  both nostrils daily. (Patient not taking: Reported on 06/07/2021)   furosemide (LASIX) 20 MG tablet Take 1 tablet (20 mg total) by mouth daily as needed for edema.   glipiZIDE (GLUCOTROL XL) 5 MG 24 hr tablet TAKE 1 TABLET BY MOUTH DAILY   glucose blood (ONETOUCH VERIO) test strip Use as instructed; Check blood sugar twice daily   guaiFENesin (MUCINEX) 600 MG 12 hr tablet Take 1 tablet (600 mg total) by mouth 2 (two) times daily. (Patient not taking: Reported on 05/17/2021)   lisinopril-hydrochlorothiazide (ZESTORETIC) 20-12.5 MG tablet Take 1 tablet by mouth every morning.   Magnesium 500 MG CAPS Take 1 capsule (500 mg total) by mouth at bedtime.   metoprolol succinate (TOPROL-XL) 100 MG 24 hr tablet TAKE 1 TABLET BY MOUTH DAILY WITH OR IMMEDIATELY FOLLOWING A MEAL   prednisoLONE acetate (PRED FORTE) 1 % ophthalmic suspension    sitaGLIPtin-metformin ([name]) 50-1000 MG tablet Take 1 tablet by mouth 2 (two) times daily with a meal. Patient receives through [name] Patient Assistance through Mar 2023.   tamoxifen (NOLVADEX) 20 MG tablet TAKE 1 TABLET(20 MG) BY MOUTH DAILY   triamcinolone cream (KENALOG) 0.1 % SMARTSIG:1 Application Topical 2-3 Times Daily (Patient not taking: Reported on 06/07/2021)   No facility-administered encounter medications on file as of 06/21/2021.    [name], CPA/CMA Clinical Pharmacist Assistant Phone: [phone]

## 2021-06-28 DIAGNOSIS — R600 Localized edema: Secondary | ICD-10-CM | POA: Diagnosis not present

## 2021-06-28 DIAGNOSIS — N1831 Chronic kidney disease, stage 3a: Secondary | ICD-10-CM | POA: Diagnosis not present

## 2021-06-28 DIAGNOSIS — I272 Pulmonary hypertension, unspecified: Secondary | ICD-10-CM | POA: Diagnosis not present

## 2021-06-28 DIAGNOSIS — I10 Essential (primary) hypertension: Secondary | ICD-10-CM | POA: Diagnosis not present

## 2021-06-28 DIAGNOSIS — E119 Type 2 diabetes mellitus without complications: Secondary | ICD-10-CM | POA: Diagnosis not present

## 2021-06-28 DIAGNOSIS — I071 Rheumatic tricuspid insufficiency: Secondary | ICD-10-CM | POA: Diagnosis not present

## 2021-06-28 DIAGNOSIS — E782 Mixed hyperlipidemia: Secondary | ICD-10-CM | POA: Diagnosis not present

## 2021-06-28 DIAGNOSIS — I119 Hypertensive heart disease without heart failure: Secondary | ICD-10-CM | POA: Diagnosis not present

## 2021-06-30 ENCOUNTER — Ambulatory Visit
Admission: RE | Admit: 2021-06-30 | Discharge: 2021-06-30 | Disposition: A | Discharge status: Home / Self Care | Payer: Medicare Other | Source: Ambulatory Visit | Attending: Oncology | Admitting: Oncology

## 2021-06-30 ENCOUNTER — Other Ambulatory Visit: Payer: Self-pay

## 2021-06-30 DIAGNOSIS — Z1231 Encounter for screening mammogram for malignant neoplasm of breast: Secondary | ICD-10-CM | POA: Insufficient documentation

## 2021-07-01 ENCOUNTER — Telehealth: Payer: Self-pay

## 2021-07-01 NOTE — Telephone Encounter (Signed)
Copied from [facility] [phone]. Topic: General - Inquiry ?>> Jul 01, 2021  2:23 PM Oneta Rack wrote: ?Janace Hoard [name] patient assistance program phone [phone] regarding medicaiton rerquest for [employer]. Received the renewal but unable to process because it came through way of fax it must be mailed: [place] [address]. Please note when mailed. ?

## 2021-07-06 ENCOUNTER — Telehealth: Payer: Self-pay

## 2021-07-06 NOTE — Progress Notes (Signed)
Per clinical pharmacist, please move patient telephone appointment on 07/09/2021 to 9:15 am. ? ? ?Patient Agreed and was reminded to have all medications, supplements and any blood glucose and blood pressure readings available for review with [name], Pharm. D, at her telephone visit on 07/09/2021 at 9:15 am. ? ?Anderson [place] ?Clinical Pharmacist Assistant ?[phone]  ? ? ? ? ?

## 2021-07-07 DIAGNOSIS — M5136 Other intervertebral disc degeneration, lumbar region: Secondary | ICD-10-CM | POA: Diagnosis not present

## 2021-07-07 DIAGNOSIS — M7918 Myalgia, other site: Secondary | ICD-10-CM | POA: Diagnosis not present

## 2021-07-07 DIAGNOSIS — M5416 Radiculopathy, lumbar region: Secondary | ICD-10-CM | POA: Diagnosis not present

## 2021-07-07 DIAGNOSIS — M9903 Segmental and somatic dysfunction of lumbar region: Secondary | ICD-10-CM | POA: Diagnosis not present

## 2021-07-07 DIAGNOSIS — M9905 Segmental and somatic dysfunction of pelvic region: Secondary | ICD-10-CM | POA: Diagnosis not present

## 2021-07-07 DIAGNOSIS — M25552 Pain in left hip: Secondary | ICD-10-CM | POA: Diagnosis not present

## 2021-07-07 DIAGNOSIS — M5451 Vertebrogenic low back pain: Secondary | ICD-10-CM | POA: Diagnosis not present

## 2021-07-09 ENCOUNTER — Telehealth: Payer: Medicare Other

## 2021-07-09 ENCOUNTER — Ambulatory Visit (INDEPENDENT_AMBULATORY_CARE_PROVIDER_SITE_OTHER): Payer: Medicare Other

## 2021-07-09 DIAGNOSIS — N1831 Chronic kidney disease, stage 3a: Secondary | ICD-10-CM

## 2021-07-09 DIAGNOSIS — E7800 Pure hypercholesterolemia, unspecified: Secondary | ICD-10-CM

## 2021-07-09 NOTE — Patient Instructions (Signed)
Visit Information ?It was great speaking with you today!  Please let me know if you have any questions about our visit. ? ? Goals Addressed   ? ?  ?  ?  ?  ? This Visit's Progress  ?  Monitor and Manage My Blood Sugar-Diabetes Type 2   On track  ?  Timeframe:  Long-Range Goal ?Priority:  High ?Start Date:  08/03/2020                           ?Expected End Date: 02/02/2022                     ? ?Follow Up within 90 days ?  ?- check blood sugar at prescribed times ?- check blood sugar if I feel it is too high or too low ?- enter blood sugar readings and medication or insulin into daily log  ?  ?Why is this important?   ?Checking your blood sugar at home helps to keep it from getting very high or very low.  ?Writing the results in a diary or log helps the doctor know how to care for you.  ?Your blood sugar log should have the time, date and the results.  ?Also, write down the amount of insulin or other medicine that you take.  ?Other information, like what you ate, exercise done and how you were feeling, will also be helpful.   ?  ?Notes:  ?  ? ?  ? ? ?Patient Care Plan: General Pharmacy (Adult)  ?  ? ?Problem Identified: Hypertension, Hyperlipidemia, Diabetes, Chronic Kidney Disease, Hypothyroidism and Chronic Pain and History of Breast Cancer   ?Priority: High  ?  ? ?Long-Range Goal: Patient-Specific Goal   ?Start Date: 08/03/2020  ?Expected End Date: 05/12/2021  ?This Visit's Progress: On track  ?Recent Progress: On track  ?Priority: High  ?Note:   ?Current Barriers:  ?Unable to independently afford treatment regimen ?Unable to achieve control of diabetes  ? ?Pharmacist Clinical Goal(s):  ?Patient will verbalize ability to afford treatment regimen ?achieve control of diabetes as evidenced by A1c less than 8% through collaboration with PharmD and provider.  ? ?Interventions: ?1:1 collaboration with Jerrol Banana., MD regarding development and update of comprehensive plan of care as evidenced by provider  attestation and co-signature ?Inter-disciplinary care team collaboration (see longitudinal plan of care) ?Comprehensive medication review performed; medication list updated in electronic medical record ? ?Hypertension (BP goal <140/90) ?-Not ideally controlled ?-Current treatment: ?Furosemide 20 mg daily  ?Lisinopril-HCTZ 20-12.5 mg daily  ?Metoprolol XL 100 mg daily  ?-Medications previously tried: NA  ?-Current home readings:  ?-Denies hypotensive/hypertensive symptoms ?-Continue current medications ? ?Hyperlipidemia: (LDL goal < 100) ?-Controlled ?-Current treatment: ?Atorvastatin 10 mg daily  ?-Medications previously tried: NA  ?-Current dietary patterns: Trying to eat more vegetables, glucerna shakes, eating less overall.  ?-Current exercise habits: Physical Therapy exercises 3 days  ?-Educated on Importance of limiting foods high in cholesterol; ?Exercise goal of 150 minutes per week; ?-Recommended to continue current medication ? ?Diabetes (A1c goal <8%) ?-Uncontrolled ?-Current medications: ?Farxiga 10 mg daily ?Glipizide XL 5 mg daily   ?[name] 50-1000 mg twice daily ?-Medications previously tried: NA  ?[name] Pap in progress. ?-Current home glucose readings ?fasting glucose: 155, 114, 139, 142, 142, 140, 132, 106, 125 ?-Denies hypoglycemic/hyperglycemic symptoms  ?-Current meal patterns:  ?breakfast: Typically skips  ?lunch: Plain Cheerios (~1 cup)   ?dinner: Burrito wrap  ?snacks:  Cheerios,  ?drinks: Water mostly.  ?-Patient with mismatch in A1c and home blood sugar readings. Patient not a candidate for CGM use, and does not have a compatible smartphone to use a sample in clinic.  ?-INCREASE monitoring to twice daily (before breakfast), 1-2 hours after supper ?-Continue current medications ? ?Patient Goals/Self-Care Activities ?Patient will:  ?- check glucose daily before breakfast, document, and provide at future appointments ?check blood pressure weekly, document, and provide at future  appointments ?target a minimum of 150 minutes of moderate intensity exercise weekly ? ?Follow Up Plan: Telephone follow up appointment with care management team member scheduled for:  07/09/2020 at 11:00 AM ?  ? ?Patient agreed to services and verbal consent obtained.  ? ?Patient verbalizes understanding of instructions and care plan provided today and agrees to view in [place]. Active MyChart status confirmed with patient.   ? ?[name], PharmD, BCACP, CPP  ?Clinical Pharmacist Practitioner  ?[place] ?[phone]  ?

## 2021-07-09 NOTE — Progress Notes (Signed)
Chronic Care Management Pharmacy Note  07/09/2021 Name:  Carol Ramsey MRN:  [id number] DOB:  November 01, 1945  Summary: Patient presents for CCM follow-up.  -Patient with mismatch in A1c and home blood sugar readings. Patient not a candidate for CGM use, and does not have a compatible smartphone to use a sample in clinic.  [name] Pap in progress.  Recommendations/Changes made from today's visit: -Continue current medications -INCREASE monitoring to twice daily (before breakfast), 1-2 hours after supper  Plan: CPP follow-up 1 months   Subjective: Carol Ramsey is an 76 y.o. year old female who is a primary patient of Jerrol Banana., MD.  The CCM team was consulted for assistance with disease management and care coordination needs.    Engaged with patient by telephone for follow up visit in response to provider referral for pharmacy case management and/or care coordination services.   Consent to Services:  The patient was given information about Chronic Care Management services, agreed to services, and gave verbal consent prior to initiation of services.  Please see initial visit note for detailed documentation.   Patient Care Team: Jerrol Banana., MD as PCP - General (Family Medicine) Marry Guan, Laurice Record, MD as Consulting Physician (Orthopedic Surgery) Dasher, Rayvon Char, MD as Consulting Physician (Dermatology) Corey Skains, MD as Consulting Physician (Cardiology) Thelma Comp, [place] as Consulting Physician (Optometry) Bary Castilla, Forest Gleason, MD (General Surgery) Noreene Filbert, MD as Referring Physician (Radiation Oncology) Lloyd Huger, MD as Consulting Physician (Oncology) Germaine Pomfret, [facility] (Pharmacist)  Recent office visits: 06/07/21: Patient presented to Dr. Rosanna Randy for follow-up.A1c worsened to 9.9%  Recent consult visits: 06/28/21: Patient presented to Jettie Booze, [place] (Cardiology) for follow-up.   Hospital visits: None in previous 6  months  Objective:  Lab Results  Component Value Date   CREATININE 1.58 (H) 06/07/2021   BUN 39 (H) 06/07/2021   GFRNONAA 45 (L) 02/19/2020   GFRAA 52 (L) 02/19/2020   NA 140 06/07/2021   K 4.7 06/07/2021   CALCIUM 9.2 06/07/2021   CO2 23 06/07/2021   GLUCOSE 192 (H) 06/07/2021    Lab Results  Component Value Date/Time   HGBA1C 9.9 (H) 06/07/2021 11:04 AM   HGBA1C 7.8 (A) 02/12/2021 11:34 AM   HGBA1C 9.0 (A) 11/05/2020 11:20 AM   HGBA1C 7.6 (H) 02/19/2020 10:56 AM    Last diabetic Eye exam:  Lab Results  Component Value Date/Time   HMDIABEYEEXA Retinopathy (A) 12/31/2020 12:00 AM    Last diabetic Foot exam: No results found for: HMDIABFOOTEX   Lab Results  Component Value Date   CHOL 106 06/07/2021   HDL 48 06/07/2021   LDLCALC 35 06/07/2021   TRIG 129 06/07/2021   CHOLHDL 2.2 06/07/2021    Hepatic Function Latest Ref Rng & Units 06/07/2021 02/19/2020 05/30/2019  Total Protein 6.0 - 8.5 g/dL 5.9(L) 6.6 -  Albumin 3.7 - 4.7 g/dL 3.6(L) 3.9 3.7  AST 0 - 40 IU/L 16 15 -  ALT 0 - 32 IU/L 5 8 -  Alk Phosphatase 44 - 121 IU/L 31(L) 53 -  Total Bilirubin 0.0 - 1.2 mg/dL 0.4 0.5 -    Lab Results  Component Value Date/Time   TSH 3.910 06/07/2021 11:04 AM   TSH 4.800 (H) 02/19/2020 10:56 AM    CBC Latest Ref Rng & Units 06/07/2021 02/19/2020 05/30/2019  WBC 3.4 - 10.8 x10E3/uL 8.2 9.2 7.7  Hemoglobin 11.1 - 15.9 g/dL 11.1 11.7 10.8(L)  Hematocrit 34.0 - 46.6 %  34.9 35.5 33.8(L)  Platelets 150 - 450 x10E3/uL 223 200 194    No results found for: VD25OH  Clinical ASCVD: No  The ASCVD Risk score (Arnett DK, et al., 2019) failed to calculate for the following reasons:   The valid total cholesterol range is 130 to 320 mg/dL    Depression screen [facility] 2/9 06/07/2021 06/01/2020 04/02/2020  Decreased Interest 0 0 0  Down, Depressed, Hopeless 0 0 0  PHQ - 2 Score 0 0 0  Altered sleeping - - -  Tired, decreased energy - - -  Change in appetite - - -  Feeling bad or failure  about yourself  - - -  Trouble concentrating - - -  Moving slowly or fidgety/restless - - -  Suicidal thoughts - - -  PHQ-9 Score - - -  Difficult doing work/chores - - -  Some recent data might be hidden    Social History   Tobacco Use  Smoking Status Never  Smokeless Tobacco Never   BP Readings from Last 3 Encounters:  06/07/21 120/64  05/11/21 117/79  02/04/21 (!) 133/46   Pulse Readings from Last 3 Encounters:  06/07/21 65  05/11/21 (!) 54  02/04/21 (!) 53   Wt Readings from Last 3 Encounters:  06/07/21 189 lb 4.8 oz (85.9 kg)  05/11/21 191 lb (86.6 kg)  02/04/21 198 lb (89.8 kg)   BMI Readings from Last 3 Encounters:  06/07/21 33.53 kg/m  05/11/21 33.83 kg/m  02/04/21 35.07 kg/m    Assessment/Interventions: Review of patient past medical history, allergies, medications, health status, including review of consultants reports, laboratory and other test data, was performed as part of comprehensive evaluation and provision of chronic care management services.   SDOH:  (Social Determinants of Health) assessments and interventions performed: Yes SDOH Interventions    Flowsheet Row Most Recent Value  SDOH Interventions   Financial Strain Interventions Other (Comment)  [PAP]         SDOH Screenings   Alcohol Screen: Low Risk    Last Alcohol Screening Score (AUDIT): 0  Depression (PHQ2-9): Low Risk    PHQ-2 Score: 0  Financial Resource Strain: High Risk   Difficulty of Paying Living Expenses: Hard  Food Insecurity: No Food Insecurity   Worried About Charity fundraiser in the Last Year: Never true   Ran Out of Food in the Last Year: Never true  Housing: Low Risk    Last Housing Risk Score: 0  Physical Activity: Insufficiently Active   Days of Exercise per Week: 2 days   Minutes of Exercise per Session: 20 min  Social Connections: Socially Isolated   Frequency of Communication with Friends and Family: Never   Frequency of Social Gatherings with Friends  and Family: Once a week   Attends Religious Services: Never   Marine scientist or Organizations: No   Attends Music therapist: Never   Marital Status: Married  Stress: No Stress Concern Present   Feeling of Stress : Not at all  Tobacco Use: Low Risk    Smoking Tobacco Use: Never   Smokeless Tobacco Use: Never   Passive Exposure: Not on file  Transportation Needs: No Transportation Needs   Lack of Transportation (Medical): No   Lack of Transportation (Non-Medical): No    CCM Care Plan  Allergies  Allergen Reactions   Gabapentin Swelling   Quinine Nausea Only and Nausea And Vomiting   Amoxicillin Itching and Rash    Has patient  had a PCN reaction causing immediate rash, facial/tongue/throat swelling, SOB or lightheadedness with hypotension: No Has patient had a PCN reaction causing severe rash involving mucus membranes or skin necrosis: No Has patient had a PCN reaction that required hospitalization No Has patient had a PCN reaction occurring within the last 10 years: No If all of the above answers are "NO", then may proceed with Cephalosporin use.    Dilaudid  [Hydromorphone Hcl] Rash   Etodolac Rash   Hydrochlorothiazide Rash   Hydrocodone Rash   Hydromorphone Rash   Sulfa Antibiotics Rash    Medications Reviewed Today     Reviewed by Dionisio David, LPN (Licensed Practical Nurse) on 06/07/21 at 1038  Med List Status: <None>   Medication Order Taking? Sig Documenting Provider Last Dose Status Informant  acetaminophen (TYLENOL) 500 MG tablet [id number] Yes Take 500 mg by mouth every 6 (six) hours as needed for mild pain or moderate pain. [provider] Taking Active Self  ascorbic acid (VITAMIN C) 500 MG tablet [id number] Yes Take 500 mg by mouth daily. [provider] Taking Active   atorvastatin (LIPITOR) 10 MG tablet [id number] Yes Take 10 mg by mouth daily at 6 PM.  [provider] Taking Active Self           Med Note  Michaelle Birks, Romaine Neville A   Tue Apr 28, 2020  1:09 PM)    benzonatate (TESSALON) 100 MG capsule [id number] No Take 1 capsule (100 mg total) by mouth 3 (three) times daily as needed.  Patient not taking: Reported on 05/11/2021   Mar Daring, PA-C Not Taking Active   Cholecalciferol (VITAMIN D) 125 MCG (5000 UT) CAPS [id number] Yes Take 5,000 Units by mouth daily. [provider] Taking Active Self           Med Note Michaelle Birks, Cathe Mons A   Tue Apr 28, 2020  1:10 PM)    Cyanocobalamin (VITAMIN B-12) 5000 MCG SUBL [id number] Yes Place 5,000 mcg under the tongue daily. [provider] Taking Active   dapagliflozin propanediol (FARXIGA) 10 MG TABS tablet [id number] Yes Take 1 tablet (10 mg total) by mouth daily. [place] through [place]'  Patient Assistance Jerrol Banana., MD Taking Active   fluticasone [facility]) 50 MCG/ACT nasal spray [id number] No Place 2 sprays into both nostrils daily.  Patient not taking: Reported on 06/07/2021   Mar Daring, PA-C Not Taking Active   furosemide (LASIX) 20 MG tablet [id number] Yes Take 1 tablet (20 mg total) by mouth daily as needed for edema. Jerrol Banana., MD Taking Active   glipiZIDE (GLUCOTROL XL) 5 MG 24 hr tablet [id number] Yes TAKE 1 TABLET BY MOUTH DAILY Jerrol Banana., MD Taking Active   glucose blood [facility] VERIO) test strip [id number] Yes Use as instructed; Check blood sugar twice daily Jerrol Banana., MD Taking Active   guaiFENesin [facility]) 600 MG 12 hr tablet [id number] No Take 1 tablet (600 mg total) by mouth 2 (two) times daily.  Patient not taking: Reported on 05/17/2021   Mar Daring, PA-C Not Taking Active   lisinopril-hydrochlorothiazide (ZESTORETIC) 20-12.5 MG tablet [id number] Yes Take 1 tablet by mouth every morning. Jerrol Banana., MD Taking Active   Magnesium 500 MG CAPS [id number]  Take 1 capsule (500 mg total) by mouth at bedtime. Milinda Pointer, MD  Expired  12/28/20 2359   metoprolol succinate (TOPROL-XL) 100 MG 24 hr tablet [id number] Yes TAKE 1 TABLET BY MOUTH  DAILY WITH OR IMMEDIATELY FOLLOWING A MEAL Jerrol Banana., MD Taking Active   prednisoLONE acetate (PRED FORTE) 1 % ophthalmic suspension [id number] Yes  [provider] Taking Active   sitaGLIPtin-metformin ([name]) 50-1000 MG tablet [id number] Yes Take 1 tablet by mouth 2 (two) times daily with a meal. Patient receives through [name] Patient Assistance through Mar 2023. Jerrol Banana., MD Taking Active   tamoxifen (NOLVADEX) 20 MG tablet [id number] Yes TAKE 1 TABLET(20 MG) BY MOUTH DAILY Grayland Ormond Kathlene November, MD Taking Active   triamcinolone cream (KENALOG) 0.1 % [id number] No SMARTSIG:1 Application Topical 2-3 Times Daily  Patient not taking: Reported on 06/07/2021   [provider] Not Taking Active   Med List Note Hart Rochester, RN 02/01/18 1118): UDS 02-01-2018   New patient            Patient Active Problem List   Diagnosis Date Noted   Moderate tricuspid regurgitation 05/25/2020   OA (osteoarthritis) of knee 10/30/2018   Lymphedema 10/30/2018   DDD (degenerative disc disease), lumbar 02/28/2018   Osteoarthritis of sacroiliac joint (Left) 02/28/2018   Somatic dysfunction of sacroiliac joint (Left) 02/28/2018   Sacroiliac joint dysfunction (Left) 02/28/2018   Abnormal MRI, lumbar spine (07/22/2013) 02/28/2018   Lumbar facet syndrome (Bilateral) 02/28/2018   Lumbar spondylosis 02/28/2018   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) 02/28/2018   Lumbar facet arthropathy (Bilateral) 02/28/2018   Lumbar intervertebral disc protrusion 02/28/2018   Lumbar nerve root compression 02/28/2018   Hypoalbuminemia 02/27/2018   Hypomagnesemia 02/27/2018   Vitamin B 12 deficiency 02/27/2018   Low serum vitamin B12 02/06/2018   Chronic low back pain (Primary Area of Pain) (Bilateral) w/ sciatica (Left) 02/01/2018   Chronic lower extremity pain (Secondary  Area of Pain) (Left) 02/01/2018   Chronic pain syndrome 02/01/2018   Pharmacologic therapy 02/01/2018   Disorder of skeletal system 02/01/2018   Problems influencing health status 02/01/2018   Chronic sacroiliac joint pain (Left) 02/01/2018   Bilateral leg edema 02/21/2017   CKD (chronic kidney disease) stage 3, GFR 30-59 ml/min ([place]) 07/21/2016   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   LVH (left ventricular hypertrophy) due to hypertensive disease, without heart failure 01/19/2016   Pulmonary hypertension ([place]) 02/10/2015   Acquired hypothyroidism 09/17/2014   Adaptation reaction 09/17/2014   Absolute anemia 09/17/2014   Cardiac dysrhythmia 09/17/2014   Lumbar facet hypertrophy 09/17/2014   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 09/17/2014   Type 2 diabetes mellitus with stage 3a chronic kidney disease, without long-term current use of insulin ([place]) 09/17/2014   Essential (primary) hypertension 09/17/2014   HLD (hyperlipidemia) 09/17/2014   Extreme obesity 09/17/2014   Vitamin D insufficiency 09/17/2014   Beat, premature ventricular 12/11/2013   OBESITY 11/14/2007   Achilles bursitis or tendinitis 11/14/2007   Calcaneal bursitis (heel), unspecified laterality 06/26/2007    Immunization History  Administered Date(s) Administered   Fluad Quad(high Dose 65+) 01/29/2019, 01/30/2020   Influenza Split 02/25/2006, 03/07/2011, 02/27/2012   Influenza, High Dose Seasonal PF 03/03/2016, 01/26/2017, 01/08/2018   Influenza,inj,Quad PF,6+ Mos 03/04/2013   PFIZER(Purple Top)SARS-COV-2 Vaccination 06/11/2019, 07/02/2019, 01/31/2020   Pneumococcal Conjugate-13 04/13/2015   Pneumococcal Polysaccharide-23 02/17/2007, 02/27/2012   Td 10/21/2003   Tdap 05/11/2016   Zoster, Live 11/08/2010    Conditions to be addressed/monitored:  Hypertension, Hyperlipidemia, Diabetes, Chronic Kidney Disease, Hypothyroidism and Chronic Pain and History of Breast Cancer  Care Plan :  General Pharmacy (Adult)  Updates made by  Germaine Pomfret, RPH since 07/09/2021 12:00 AM     Problem: Hypertension, Hyperlipidemia, Diabetes, Chronic Kidney Disease, Hypothyroidism and Chronic Pain and History of Breast Cancer   Priority: High     Long-Range Goal: Patient-Specific Goal   Start Date: 08/03/2020  Expected End Date: 05/12/2021  This Visit's Progress: On track  Recent Progress: On track  Priority: High  Note:   Current Barriers:  Unable to independently afford treatment regimen Unable to achieve control of diabetes   Pharmacist Clinical Goal(s):  Patient will verbalize ability to afford treatment regimen achieve control of diabetes as evidenced by A1c less than 8% through collaboration with PharmD and provider.   Interventions: 1:1 collaboration with Jerrol Banana., MD regarding development and update of comprehensive plan of care as evidenced by provider attestation and co-signature Inter-disciplinary care team collaboration (see longitudinal plan of care) Comprehensive medication review performed; medication list updated in electronic medical record  Hypertension (BP goal <140/90) -Not ideally controlled -Current treatment: Furosemide 20 mg daily  Lisinopril-HCTZ 20-12.5 mg daily  Metoprolol XL 100 mg daily  -Medications previously tried: NA  -Current home readings:  -Denies hypotensive/hypertensive symptoms -Continue current medications  Hyperlipidemia: (LDL goal < 100) -Controlled -Current treatment: Atorvastatin 10 mg daily  -Medications previously tried: NA  -Current dietary patterns: Trying to eat more vegetables, glucerna shakes, eating less overall.  -Current exercise habits: Physical Therapy exercises 3 days  -Educated on Importance of limiting foods high in cholesterol; Exercise goal of 150 minutes per week; -Recommended to continue current medication  Diabetes (A1c goal <8%) -Uncontrolled -Current medications: Farxiga 10 mg  daily Glipizide XL 5 mg daily   [name] 50-1000 mg twice daily -Medications previously tried: NA  [name] Pap in progress. -Current home glucose readings fasting glucose: 155, 114, 139, 142, 142, 140, 132, 106, 125 -Denies hypoglycemic/hyperglycemic symptoms  -Current meal patterns:  breakfast: Typically skips  lunch: Plain Cheerios (~1 cup)   dinner: Burrito wrap  snacks: Cheerios,  drinks: Water mostly.  -Patient with mismatch in A1c and home blood sugar readings. Patient not a candidate for CGM use, and does not have a compatible smartphone to use a sample in clinic.  -INCREASE monitoring to twice daily (before breakfast), 1-2 hours after supper -Continue current medications  Patient Goals/Self-Care Activities Patient will:  - check glucose daily before breakfast, document, and provide at future appointments check blood pressure weekly, document, and provide at future appointments target a minimum of 150 minutes of moderate intensity exercise weekly  Follow Up Plan: Telephone follow up appointment with care management team member scheduled for:  07/09/2020 at 11:00 AM    Medication Assistance:  [place] obtained through [name] medication assistance program.  Enrollment ends Mar 2023 [name] obtained through [name] medication [place] program.  Renewal prescription sent in for 2023.   Patient's preferred pharmacy is:  [id number] [address], [place] AT [facility] OF [address] & [address] [place] [address] Phone: [phone] Fax: [place], [place] [address] [place] [address] Phone: [phone] Fax: [phone]   Uses pill box? Yes Pt endorses 80% compliance  We discussed: Current pharmacy is preferred with insurance plan and patient is satisfied with pharmacy services Patient decided to: Continue current medication management strategy  Care Plan and Follow Up Patient  Decision:  Patient agrees to Care Plan and Follow-up.  Plan: Telephone  follow up appointment with care management team member scheduled for:  07/09/2020 at 11:00 AM  [name], PharmD, Para [place], [place] [phone]

## 2021-07-15 DIAGNOSIS — D0439 Carcinoma in situ of skin of other parts of face: Secondary | ICD-10-CM | POA: Diagnosis not present

## 2021-07-15 DIAGNOSIS — L905 Scar conditions and fibrosis of skin: Secondary | ICD-10-CM | POA: Diagnosis not present

## 2021-07-22 DIAGNOSIS — Z96652 Presence of left artificial knee joint: Secondary | ICD-10-CM | POA: Diagnosis not present

## 2021-07-22 DIAGNOSIS — M1711 Unilateral primary osteoarthritis, right knee: Secondary | ICD-10-CM | POA: Diagnosis not present

## 2021-07-27 ENCOUNTER — Telehealth: Payer: Self-pay

## 2021-07-27 NOTE — Telephone Encounter (Signed)
Copied from [place] [phone]. Topic: General - Other ?>> Jul 27, 2021  3:03 PM Pawlus, Brayton Layman A wrote: ?Reason for CRM: Pt was calling to speak with Germaine Pomfret, [facility],  please advise. ?

## 2021-07-28 ENCOUNTER — Telehealth: Payer: Self-pay

## 2021-07-28 NOTE — Progress Notes (Signed)
? ? ?  Chronic Care Management ?Pharmacy Assistant  ? ?Name: Carol Ramsey  MRN: [id number] DOB: [birth year] ? ?Patient Assistance Renewal Application Follow-up ? ?The patient called to advised that when she contacted [name] they still had not received her patient assistance renewal application. I informed her that I would contact [name] to see if I could find out additional details as the application was mailed to them on 07/09/2021 by CPP. ? ?I contacted [name] and spoke with a representative whom advised that they received the application on yesterday, but now they have to mail the patient and Attestation Form. [name] only provides this medication for patient's who has no insurance coverage, and they will need the patient to fill out this form attesting to what her hardship is for needing this medication covered by them being that she has insurance, and what type of Medicare does she have. If her hardship qualifies they will approve her application. Per representative the patient completed the form last year, and it's the same process as last year. ? ?Patient and CPP notified.  ? ?Medications: ?Outpatient Encounter Medications as of 07/28/2021  ?Medication Sig  ? acetaminophen (TYLENOL) 500 MG tablet Take 500 mg by mouth every 6 (six) hours as needed for mild pain or moderate pain.  ? ascorbic acid (VITAMIN C) 500 MG tablet Take 500 mg by mouth daily.  ? atorvastatin (LIPITOR) 10 MG tablet Take 10 mg by mouth daily at 6 PM.   ? benzonatate (TESSALON) 100 MG capsule Take 1 capsule (100 mg total) by mouth 3 (three) times daily as needed. (Patient not taking: Reported on 05/11/2021)  ? Cholecalciferol (VITAMIN D) 125 MCG (5000 UT) CAPS Take 5,000 Units by mouth daily.  ? Cyanocobalamin (VITAMIN B-12) 5000 MCG SUBL Place 5,000 mcg under the tongue daily.  ? dapagliflozin propanediol (FARXIGA) 10 MG TABS tablet Take 1 tablet (10 mg total) by mouth daily. Patient receives through [place]  Patient Assistance  ? fluticasone (FLONASE)  50 MCG/ACT nasal spray Place 2 sprays into both nostrils daily. (Patient not taking: Reported on 06/07/2021)  ? furosemide (LASIX) 20 MG tablet Take 1 tablet (20 mg total) by mouth daily as needed for edema.  ? glipiZIDE (GLUCOTROL XL) 5 MG 24 hr tablet TAKE 1 TABLET BY MOUTH DAILY  ? glucose blood (ONETOUCH VERIO) test strip Use as instructed; Check blood sugar twice daily  ? guaiFENesin (MUCINEX) 600 MG 12 hr tablet Take 1 tablet (600 mg total) by mouth 2 (two) times daily. (Patient not taking: Reported on 05/17/2021)  ? lisinopril-hydrochlorothiazide (ZESTORETIC) 20-12.5 MG tablet Take 1 tablet by mouth every morning.  ? Magnesium 500 MG CAPS Take 1 capsule (500 mg total) by mouth at bedtime.  ? metoprolol succinate (TOPROL-XL) 100 MG 24 hr tablet TAKE 1 TABLET BY MOUTH DAILY WITH OR IMMEDIATELY FOLLOWING A MEAL  ? prednisoLONE acetate (PRED FORTE) 1 % ophthalmic suspension   ? sitaGLIPtin-metformin ([name]) 50-1000 MG tablet Take 1 tablet by mouth 2 (two) times daily with a meal. Patient receives through [name] Patient Assistance through Mar 2023.  ? tamoxifen (NOLVADEX) 20 MG tablet TAKE 1 TABLET(20 MG) BY MOUTH DAILY  ? triamcinolone cream (KENALOG) 0.1 % SMARTSIG:1 Application Topical 2-3 Times Daily (Patient not taking: Reported on 06/07/2021)  ? ?No facility-administered encounter medications on file as of 07/28/2021.  ? ? ?[name], CPA/CMA ?Clinical Pharmacist Assistant ?Phone: [phone]  ? ?

## 2021-07-30 DIAGNOSIS — N1831 Chronic kidney disease, stage 3a: Secondary | ICD-10-CM | POA: Diagnosis not present

## 2021-07-30 DIAGNOSIS — E7800 Pure hypercholesterolemia, unspecified: Secondary | ICD-10-CM | POA: Diagnosis not present

## 2021-07-30 DIAGNOSIS — E1122 Type 2 diabetes mellitus with diabetic chronic kidney disease: Secondary | ICD-10-CM | POA: Diagnosis not present

## 2021-08-04 DIAGNOSIS — M9905 Segmental and somatic dysfunction of pelvic region: Secondary | ICD-10-CM | POA: Diagnosis not present

## 2021-08-04 DIAGNOSIS — M5416 Radiculopathy, lumbar region: Secondary | ICD-10-CM | POA: Diagnosis not present

## 2021-08-04 DIAGNOSIS — M7918 Myalgia, other site: Secondary | ICD-10-CM | POA: Diagnosis not present

## 2021-08-04 DIAGNOSIS — M5136 Other intervertebral disc degeneration, lumbar region: Secondary | ICD-10-CM | POA: Diagnosis not present

## 2021-08-04 DIAGNOSIS — M25552 Pain in left hip: Secondary | ICD-10-CM | POA: Diagnosis not present

## 2021-08-04 DIAGNOSIS — M9903 Segmental and somatic dysfunction of lumbar region: Secondary | ICD-10-CM | POA: Diagnosis not present

## 2021-08-04 DIAGNOSIS — M5451 Vertebrogenic low back pain: Secondary | ICD-10-CM | POA: Diagnosis not present

## 2021-08-10 ENCOUNTER — Ambulatory Visit (INDEPENDENT_AMBULATORY_CARE_PROVIDER_SITE_OTHER): Payer: Medicare Other

## 2021-08-10 DIAGNOSIS — I119 Hypertensive heart disease without heart failure: Secondary | ICD-10-CM

## 2021-08-10 DIAGNOSIS — E1122 Type 2 diabetes mellitus with diabetic chronic kidney disease: Secondary | ICD-10-CM

## 2021-08-10 NOTE — Patient Instructions (Signed)
Visit Information ?It was great speaking with you today!  Please let me know if you have any questions about our visit. ? ? Goals Addressed   ? ?  ?  ?  ?  ? This Visit's Progress  ?  Monitor and Manage My Blood Sugar-Diabetes Type 2   On track  ?  Timeframe:  Long-Range Goal ?Priority:  High ?Start Date:  08/03/2020                           ?Expected End Date: 02/02/2022                     ? ?Follow Up within 90 days ?  ?- check blood sugar twice daily (before breakfast and after supper) ?- check blood sugar if I feel it is too high or too low ?- enter blood sugar readings and medication or insulin into daily log ?- take the blood sugar log to all doctor visits  ?  ?Why is this important?   ?Checking your blood sugar at home helps to keep it from getting very high or very low.  ?Writing the results in a diary or log helps the doctor know how to care for you.  ?Your blood sugar log should have the time, date and the results.  ?Also, write down the amount of insulin or other medicine that you take.  ?Other information, like what you ate, exercise done and how you were feeling, will also be helpful.   ?  ?Notes:  ?  ? ?  ? ? ?Patient Care Plan: Diabetes Type 2 (Adult)  ?Completed 05/19/2021  ? ?Problem Identified: Disease Progression (Diabetes, Type 2) Resolved 05/19/2021  ?  ? ?Long-Range Goal: Disease Progression Prevented or Minimized Completed 05/17/2021  ?Start Date: 02/23/2021  ?Expected End Date: 05/24/2021  ?Priority: High  ?Note:   ?Resolving due to duplicate goal  ? ? ?  ? ?Patient Care Plan: General Pharmacy (Adult)  ?  ? ?Problem Identified: Hypertension, Hyperlipidemia, Diabetes, Chronic Kidney Disease, Hypothyroidism and Chronic Pain and History of Breast Cancer   ?Priority: High  ?  ? ?Long-Range Goal: Patient-Specific Goal   ?Start Date: 08/03/2020  ?Expected End Date: 05/12/2021  ?This Visit's Progress: On track  ?Recent Progress: On track  ?Priority: High  ?Note:   ?Current Barriers:  ?Unable to  independently afford treatment regimen ?Unable to achieve control of diabetes  ? ?Pharmacist Clinical Goal(s):  ?Patient will verbalize ability to afford treatment regimen ?achieve control of diabetes as evidenced by A1c less than 8% through collaboration with PharmD and provider.  ? ?Interventions: ?1:1 collaboration with Jerrol Banana., MD regarding development and update of comprehensive plan of care as evidenced by provider attestation and co-signature ?Inter-disciplinary care team collaboration (see longitudinal plan of care) ?Comprehensive medication review performed; medication list updated in electronic medical record ? ?Hypertension (BP goal <140/90) ?-Not ideally controlled ?-Current treatment: ?Furosemide 20 mg daily  ?Lisinopril-HCTZ 20-12.5 mg daily  ?Metoprolol XL 100 mg daily  ?-Medications previously tried: NA  ?-Current home readings:  ? 101/54 ? 104/54 ? 100/55 ?-Denies hypotensive/hypertensive symptoms ?-Continue current medications ? ?Hyperlipidemia: (LDL goal < 100) ?-Controlled ?-Current treatment: ?Atorvastatin 10 mg daily  ?-Medications previously tried: NA  ?-Current dietary patterns: Trying to eat more vegetables, glucerna shakes, eating less overall.  ?-Current exercise habits: Physical Therapy exercises 3 days  ?-Educated on Importance of limiting foods high in cholesterol; ?Exercise goal of  150 minutes per week; ?-Recommended to continue current medication ? ?Diabetes (A1c goal <8%) ?-Uncontrolled ?-Current medications: ?Farxiga 10 mg daily ?Glipizide XL 5 mg daily   ?[name] 50-1000 mg twice daily ?-Medications previously tried: NA  ?[name] Pap in progress. ?-Current home glucose readings ?fasting glucose: 125, 128, 132, 137, 140,  ?After Supper: 230, 250, 230, 180, 138  ?-Denies hypoglycemic/hyperglycemic symptoms  ?-Current meal patterns:  ?breakfast: Typically skips  ?lunch: Plain Cheerios (~1 cup)   ?dinner: Glucerna OR soup + crackers + apple   ?snacks: Cheerios,  ?drinks:  Water mostly.  ?--Current exercise patterns: Band exercises 3x weekly, 15 minutes each.  ?-Patient with prandial excursions associated with diet. Discussed carbohydrate counting, portion control.  ?-Continue current medications ? ?Patient Goals/Self-Care Activities ?Patient will:  ?- check glucose daily before breakfast, document, and provide at future appointments ?check blood pressure weekly, document, and provide at future appointments ?target a minimum of 150 minutes of moderate intensity exercise weekly ? ?Follow Up Plan: Telephone follow up appointment with care management team member scheduled for:  09/14/2021 at 11:00 AM ?  ? ?Patient agreed to services and verbal consent obtained.  ? ?Patient verbalizes understanding of instructions and care plan provided today and agrees to view in [place]. Active MyChart status confirmed with patient.   ? ?[name], PharmD, BCACP, CPP  ?Clinical Pharmacist Practitioner  ?[place] ?[phone] ?  ?

## 2021-08-10 NOTE — Progress Notes (Signed)
? ?Chronic Care Management ?Pharmacy Note ? ?08/10/2021 ?Name:  Carol Ramsey MRN:  [id number] DOB:  Jan 17, 1946 ? ?Summary: ?Patient presents for CCM follow-up.  ?--Patient with prandial glucose excursions associated with diet. Discussed carbohydrate counting, portion control.  ? ?Recommendations/Changes made from today's visit: ?-Continue current medications ? ?Plan: ?CPP follow-up 1 months  ? ?Subjective: ?Carol Ramsey is an 76 y.o. year old female who is a primary patient of Jerrol Banana., MD.  The CCM team was consulted for assistance with disease management and care coordination needs.   ? ?Engaged with patient by telephone for follow up visit in response to provider referral for pharmacy case management and/or care coordination services.  ? ?Consent to Services:  ?The patient was given information about Chronic Care Management services, agreed to services, and gave verbal consent prior to initiation of services.  Please see initial visit note for detailed documentation.  ? ?Patient Care Team: ?Jerrol Banana., MD as PCP - General (Family Medicine) ?Hooten, Laurice Record, MD as Consulting Physician (Orthopedic Surgery) ?Dasher, Rayvon Char, MD as Consulting Physician (Dermatology) ?Corey Skains, MD as Consulting Physician (Cardiology) ?Thelma Comp, OD as Consulting Physician (Optometry) ?Robert Bellow, MD (General Surgery) ?Noreene Filbert, MD as Referring Physician (Radiation Oncology) ?Lloyd Huger, MD as Consulting Physician (Oncology) ?Germaine Pomfret, [facility] (Pharmacist) ? ?Recent office visits: ?06/07/21: Patient presented to Dr. Rosanna Randy for follow-up.A1c worsened to 9.9% ? ?Recent consult visits: ?06/28/21: Patient presented to Jettie Booze, [place] (Cardiology) for follow-up.  ? ?Hospital visits: ?None in previous 6 months ? ?Objective: ? ?Lab Results  ?Component Value Date  ? CREATININE 1.58 (H) 06/07/2021  ? BUN 39 (H) 06/07/2021  ? GFRNONAA 45 (L) 02/19/2020  ? GFRAA 52 (L)  02/19/2020  ? NA 140 06/07/2021  ? K 4.7 06/07/2021  ? CALCIUM 9.2 06/07/2021  ? CO2 23 06/07/2021  ? GLUCOSE 192 (H) 06/07/2021  ? ? ?Lab Results  ?Component Value Date/Time  ? HGBA1C 9.9 (H) 06/07/2021 11:04 AM  ? HGBA1C 7.8 (A) 02/12/2021 11:34 AM  ? HGBA1C 9.0 (A) 11/05/2020 11:20 AM  ? HGBA1C 7.6 (H) 02/19/2020 10:56 AM  ?  ?Last diabetic Eye exam:  ?Lab Results  ?Component Value Date/Time  ? HMDIABEYEEXA Retinopathy (A) 12/31/2020 12:00 AM  ?  ?Last diabetic Foot exam: No results found for: HMDIABFOOTEX  ? ?Lab Results  ?Component Value Date  ? CHOL 106 06/07/2021  ? HDL 48 06/07/2021  ? [place] 35 06/07/2021  ? TRIG 129 06/07/2021  ? CHOLHDL 2.2 06/07/2021  ? ? ? ?  Latest Ref Rng & Units 06/07/2021  ? 11:04 AM 02/19/2020  ? 10:56 AM 05/30/2019  ? 10:13 AM  ?Hepatic Function  ?Total Protein 6.0 - 8.5 g/dL 5.9   6.6     ?Albumin 3.7 - 4.7 g/dL 3.6   3.9   3.7    ?AST 0 - 40 IU/L 16   15     ?ALT 0 - 32 IU/L 5   8     ?Alk Phosphatase 44 - 121 IU/L 31   53     ?Total Bilirubin 0.0 - 1.2 mg/dL 0.4   0.5     ? ? ?Lab Results  ?Component Value Date/Time  ? TSH 3.910 06/07/2021 11:04 AM  ? TSH 4.800 (H) 02/19/2020 10:56 AM  ? ? ? ?  Latest Ref Rng & Units 06/07/2021  ? 11:04 AM 02/19/2020  ? 10:56 AM 05/30/2019  ? 10:13 AM  ?  CBC  ?WBC 3.4 - 10.8 x10E3/uL 8.2   9.2   7.7    ?Hemoglobin 11.1 - 15.9 g/dL 11.1   11.7   10.8    ?Hematocrit 34.0 - 46.6 % 34.9   35.5   33.8    ?Platelets 150 - 450 x10E3/uL 223   200   194    ? ? ?No results found for: VD25OH ? ?Clinical ASCVD: No  ?The ASCVD Risk score (Arnett DK, et al., 2019) failed to calculate for the following reasons: ?  The valid total cholesterol range is 130 to 320 mg/dL   ? ? ?  06/07/2021  ? 10:23 AM 06/01/2020  ? 11:17 AM 04/02/2020  ? 11:22 AM  ?Depression screen PHQ 2/9  ?Decreased Interest 0 0 0  ?Down, Depressed, Hopeless 0 0 0  ?PHQ - 2 Score 0 0 0  ?  ?Social History  ? ?Tobacco Use  ?Smoking Status Never  ?Smokeless Tobacco Never  ? ?BP Readings from Last 3  Encounters:  ?06/07/21 120/64  ?05/11/21 117/79  ?02/04/21 (!) 133/46  ? ?Pulse Readings from Last 3 Encounters:  ?06/07/21 65  ?05/11/21 (!) 54  ?02/04/21 (!) 53  ? ?Wt Readings from Last 3 Encounters:  ?06/07/21 189 lb 4.8 oz (85.9 kg)  ?05/11/21 191 lb (86.6 kg)  ?02/04/21 198 lb (89.8 kg)  ? ?BMI Readings from Last 3 Encounters:  ?06/07/21 33.53 kg/m?  ?05/11/21 33.83 kg/m?  ?02/04/21 35.07 kg/m?  ? ? ?Assessment/Interventions: Review of patient past medical history, allergies, medications, health status, including review of consultants reports, laboratory and other test data, was performed as part of comprehensive evaluation and provision of chronic care management services.  ? ?SDOH:  (Social Determinants of Health) assessments and interventions performed: Yes ? ? ? ? ? ?SDOH Screenings  ? ?Alcohol Screen: Low Risk   ? Last Alcohol Screening Score (AUDIT): 0  ?Depression (PHQ2-9): Low Risk   ? PHQ-2 Score: 0  ?Financial Resource Strain: High Risk  ? Difficulty of Paying Living Expenses: Hard  ?Food Insecurity: No Food Insecurity  ? Worried About Charity fundraiser in the Last Year: Never true  ? Ran Out of Food in the Last Year: Never true  ?Housing: Low Risk   ? Last Housing Risk Score: 0  ?Physical Activity: Insufficiently Active  ? Days of Exercise per Week: 2 days  ? Minutes of Exercise per Session: 20 min  ?Social Connections: Socially Isolated  ? Frequency of Communication with Friends and Family: Never  ? Frequency of Social Gatherings with Friends and Family: Once a week  ? Attends Religious Services: Never  ? Active Member of Clubs or Organizations: No  ? Attends Archivist Meetings: Never  ? Marital Status: Married  ?Stress: No Stress Concern Present  ? Feeling of Stress : Not at all  ?Tobacco Use: Low Risk   ? Smoking Tobacco Use: Never  ? Smokeless Tobacco Use: Never  ? Passive Exposure: Not on file  ?Transportation Needs: No Transportation Needs  ? Lack of Transportation (Medical): No   ? Lack of Transportation (Non-Medical): No  ? ? ?CCM Care Plan ? ?Allergies  ?Allergen Reactions  ? Gabapentin Swelling  ? Quinine Nausea Only and Nausea And Vomiting  ? Amoxicillin Itching and Rash  ?  Has patient had a PCN reaction causing immediate rash, facial/tongue/throat swelling, SOB or lightheadedness with hypotension: No ?Has patient had a PCN reaction causing severe rash involving mucus membranes or skin necrosis: No ?  Has patient had a PCN reaction that required hospitalization No ?Has patient had a PCN reaction occurring within the last 10 years: No ?If all of the above answers are "NO", then may proceed with Cephalosporin use. ?  ? Dilaudid  [Hydromorphone Hcl] Rash  ? Etodolac Rash  ? Hydrochlorothiazide Rash  ? Hydrocodone Rash  ? Hydromorphone Rash  ? Sulfa Antibiotics Rash  ? ? ?Medications Reviewed Today   ? ? Reviewed by Dionisio David, LPN (Licensed Practical Nurse) on 06/07/21 at 1038  Med List Status: <None>  ? ?Medication Order Taking? Sig Documenting Provider Last Dose Status Informant  ?acetaminophen (TYLENOL) 500 MG tablet [id number] Yes Take 500 mg by mouth every 6 (six) hours as needed for mild pain or moderate pain. [provider] Taking Active Self  ?ascorbic acid (VITAMIN C) 500 MG tablet [id number] Yes Take 500 mg by mouth daily. [provider] Taking Active   ?atorvastatin (LIPITOR) 10 MG tablet [id number] Yes Take 10 mg by mouth daily at 6 PM.  [provider] Taking Active Self  ?         ?Med Note Daron Offer A   Tue Apr 28, 2020  1:09 PM)    ?benzonatate (TESSALON) 100 MG capsule [id number] No Take 1 capsule (100 mg total) by mouth 3 (three) times daily as needed.  ?Patient not taking: Reported on 05/11/2021  ? Mar Daring, PA-C Not Taking Active   ?Cholecalciferol (VITAMIN D) 125 MCG (5000 UT) CAPS [id number] Yes Take 5,000 Units by mouth daily. [provider] Taking Active Self  ?         ?Med Note Michaelle Birks, Tyrick Dunagan A   Tue  Apr 28, 2020  1:10 PM)    ?Cyanocobalamin (VITAMIN B-12) 5000 MCG SUBL [id number] Yes Place 5,000 mcg under the tongue daily. [provider] Taking Active   ?dapagliflozin propanediol (FARXIGA) 10 MG TA

## 2021-08-16 DIAGNOSIS — Z20822 Contact with and (suspected) exposure to covid-19: Secondary | ICD-10-CM | POA: Diagnosis not present

## 2021-08-29 DIAGNOSIS — I119 Hypertensive heart disease without heart failure: Secondary | ICD-10-CM

## 2021-08-29 DIAGNOSIS — E1122 Type 2 diabetes mellitus with diabetic chronic kidney disease: Secondary | ICD-10-CM

## 2021-08-29 DIAGNOSIS — N1831 Chronic kidney disease, stage 3a: Secondary | ICD-10-CM

## 2021-09-01 DIAGNOSIS — M5451 Vertebrogenic low back pain: Secondary | ICD-10-CM | POA: Diagnosis not present

## 2021-09-01 DIAGNOSIS — M7918 Myalgia, other site: Secondary | ICD-10-CM | POA: Diagnosis not present

## 2021-09-01 DIAGNOSIS — M5416 Radiculopathy, lumbar region: Secondary | ICD-10-CM | POA: Diagnosis not present

## 2021-09-01 DIAGNOSIS — M5136 Other intervertebral disc degeneration, lumbar region: Secondary | ICD-10-CM | POA: Diagnosis not present

## 2021-09-01 DIAGNOSIS — M9905 Segmental and somatic dysfunction of pelvic region: Secondary | ICD-10-CM | POA: Diagnosis not present

## 2021-09-01 DIAGNOSIS — M9903 Segmental and somatic dysfunction of lumbar region: Secondary | ICD-10-CM | POA: Diagnosis not present

## 2021-09-01 DIAGNOSIS — M25552 Pain in left hip: Secondary | ICD-10-CM | POA: Diagnosis not present

## 2021-09-08 ENCOUNTER — Other Ambulatory Visit: Payer: Self-pay | Admitting: Family Medicine

## 2021-09-08 DIAGNOSIS — I10 Essential (primary) hypertension: Secondary | ICD-10-CM

## 2021-09-08 DIAGNOSIS — E119 Type 2 diabetes mellitus without complications: Secondary | ICD-10-CM

## 2021-09-08 NOTE — Telephone Encounter (Signed)
Requested Prescriptions  ?Pending Prescriptions Disp Refills  ?? lisinopril-hydrochlorothiazide (ZESTORETIC) 20-12.5 MG tablet [Pharmacy Med Name: LISINOPRIL-HCTZ 20/12.5MG TABLETS] 90 tablet 0  ?  Sig: TAKE 1 TABLET BY MOUTH EVERY MORNING  ?  ? Cardiovascular:  ACEI + Diuretic Combos Failed - 09/08/2021 12:29 PM  ?  ?  Failed - Cr in normal range and within 180 days  ?  Creat  ?Date Value Ref Range Status  ?01/26/2017 1.16 (H) 0.60 - 0.93 mg/dL Final  ?  Comment:  ?  For patients >36 years of age, the reference limit ?for Creatinine is approximately 13% higher for people ?identified as African-American. ?. ?  ? ?Creatinine, Ser  ?Date Value Ref Range Status  ?06/07/2021 1.58 (H) 0.57 - 1.00 mg/dL Final  ?   ?  ?  Passed - Na in normal range and within 180 days  ?  Sodium  ?Date Value Ref Range Status  ?06/07/2021 140 134 - 144 mmol/L Final  ?07/28/2011 140 136 - 145 mmol/L Final  ?   ?  ?  Passed - K in normal range and within 180 days  ?  Potassium  ?Date Value Ref Range Status  ?06/07/2021 4.7 3.5 - 5.2 mmol/L Final  ?07/28/2011 4.2 3.5 - 5.1 mmol/L Final  ?   ?  ?  Passed - eGFR is 30 or above and within 180 days  ?  EGFR (African American)  ?Date Value Ref Range Status  ?08/31/2011 57 (L)  Final  ? ?GFR calc Af Amer  ?Date Value Ref Range Status  ?02/19/2020 52 (L) >59 mL/min/1.73 Final  ?  Comment:  ?  **In accordance with recommendations from the NKF-ASN Task force,** ?  [facility] is in the process of updating its eGFR calculation to the ?  2021 CKD-EPI creatinine equation that estimates kidney function ?  without a race variable. ?  ? ?EGFR (Non-African Amer.)  ?Date Value Ref Range Status  ?08/31/2011 49 (L)  Final  ?  Comment:  ?  eGFR values <59m/min/1.73 m2 may be an indication of chronic ?kidney disease (CKD). ?Calculated eGFR is useful in patients with stable renal function. ?The eGFR calculation will not be reliable in acutely ill patients ?when serum creatinine is changing rapidly. It is not useful in   ?patients on dialysis. The eGFR calculation may not be applicable ?to patients at the low and high extremes of body sizes, pregnant ?women, and vegetarians. ?  ? ?GFR calc non Af Amer  ?Date Value Ref Range Status  ?02/19/2020 45 (L) >59 mL/min/1.73 Final  ? ?eGFR  ?Date Value Ref Range Status  ?06/07/2021 34 (L) >59 mL/min/1.73 Final  ?   ?  ?  Passed - Patient is not pregnant  ?  ?  Passed - Last BP in normal range  ?  BP Readings from Last 1 Encounters:  ?06/07/21 120/64  ?   ?  ?  Passed - Valid encounter within last 6 months  ?  Recent Outpatient Visits   ?      ? 3 months ago Type 2 diabetes mellitus with stage 3a chronic kidney disease, without long-term current use of insulin ([facility]  [facility]  ? 7 months ago Type 2 diabetes mellitus with stage 3a chronic kidney disease, without long-term current use of insulin ([place]  [facility]  ? 10 months ago Type 2 diabetes mellitus with stage 3a chronic kidney  disease, without long-term current use of insulin ([place])  ? [facility] Jerrol Ban[facility] MD  ? 1 year ago Type 2 diabetes mellitus without complication, without long-term current use of insulin ([place])  ? [facility]  ? 1 year ago Type 2 diabetes mellitus without complication, without long-term current use of insulin ([place])  ? [facility] Jerrol Banana., MD  ?  ?  ?Future Appointments   ?        ? In 4 weeks Jerrol Banana., MD [facility], [facility]  ?  ? ?  ?  ?  ? ?

## 2021-09-14 ENCOUNTER — Ambulatory Visit (INDEPENDENT_AMBULATORY_CARE_PROVIDER_SITE_OTHER): Payer: Medicare Other

## 2021-09-14 ENCOUNTER — Telehealth: Payer: Self-pay

## 2021-09-14 DIAGNOSIS — I10 Essential (primary) hypertension: Secondary | ICD-10-CM

## 2021-09-14 DIAGNOSIS — N1831 Chronic kidney disease, stage 3a: Secondary | ICD-10-CM

## 2021-09-14 MED ORDER — DAPAGLIFLOZIN PROPANEDIOL 10 MG PO TABS: 10.0000 mg | ORAL_TABLET | Freq: Every day | ORAL | 3 refills | Status: DC

## 2021-09-14 NOTE — Patient Instructions (Signed)
Visit Information ?It was great speaking with you today!  Please let me know if you have any questions about our visit. ? ? Goals Addressed   ? ?  ?  ?  ?  ? This Visit's Progress  ?  Monitor and Manage My Blood Sugar-Diabetes Type 2   On track  ?  Timeframe:  Long-Range Goal ?Priority:  High ?Start Date:  08/03/2020                           ?Expected End Date: 02/02/2022                     ? ?Follow Up within 30 days ?  ?- check blood sugar twice daily (before breakfast and after supper) ?- check blood sugar if I feel it is too high or too low ?- enter blood sugar readings and medication or insulin into daily log ?- take the blood sugar log to all doctor visits  ?  ?Why is this important?   ?Checking your blood sugar at home helps to keep it from getting very high or very low.  ?Writing the results in a diary or log helps the doctor know how to care for you.  ?Your blood sugar log should have the time, date and the results.  ?Also, write down the amount of insulin or other medicine that you take.  ?Other information, like what you ate, exercise done and how you were feeling, will also be helpful.   ?  ?Notes:  ?  ? ?  ? ?Patient Care Plan: General Pharmacy (Adult)  ?  ? ?Problem Identified: Hypertension, Hyperlipidemia, Diabetes, Chronic Kidney Disease, Hypothyroidism and Chronic Pain and History of Breast Cancer   ?Priority: High  ?  ? ?Long-Range Goal: Patient-Specific Goal   ?Start Date: 08/03/2020  ?Expected End Date: 09/15/2022  ?This Visit's Progress: On track  ?Recent Progress: On track  ?Priority: High  ?Note:   ?Current Barriers:  ?Unable to independently afford treatment regimen ?Unable to achieve control of diabetes  ? ?Pharmacist Clinical Goal(s):  ?Patient will verbalize ability to afford treatment regimen ?achieve control of diabetes as evidenced by A1c less than 8% through collaboration with PharmD and provider.  ? ?Interventions: ?1:1 collaboration with Jerrol Banana., MD regarding development  and update of comprehensive plan of care as evidenced by provider attestation and co-signature ?Inter-disciplinary care team collaboration (see longitudinal plan of care) ?Comprehensive medication review performed; medication list updated in electronic medical record ? ?Hypertension (BP goal <140/90) ?-Controlled ?-Current treatment: ?Furosemide 20 mg daily: Appropriate, Effective, Safe, Accessible  ?Lisinopril-HCTZ 20-12.5 mg daily: Appropriate, Effective, Safe, Accessible  ?Metoprolol XL 100 mg daily: Appropriate, Effective, Safe, Accessible  ?-Medications previously tried: NA  ?-Current home readings:  ? 110/63; Pulse 66  ? 104/56; Pulse 57  ? 121/64; pulse 50 ?-Denies hypotensive/hypertensive symptoms ?-Continue current medications ? ?Hyperlipidemia: (LDL goal < 70) ?-Controlled: Not addressed during this visit. ?-Current treatment: ?Atorvastatin 10 mg daily  ?-Medications previously tried: NA  ?-Current dietary patterns: Trying to eat more vegetables, glucerna shakes, eating less overall.  ?-Current exercise habits: Physical Therapy exercises 3 days  ?-Educated on Importance of limiting foods high in cholesterol; ?Exercise goal of 150 minutes per week; ?-Recommended to continue current medication ? ?Diabetes (A1c goal <8%) ?-Uncontrolled, but improved.  ?-Current medications: ?Farxiga 10 mg daily: Appropriate, Effective, Safe, Accessible ?Glipizide XL 5 mg daily: Appropriate, Effective, Safe, Accessible   ?  [name] 50-1000 mg twice daily: Appropriate, Effective, Safe, Query accessible ?-Medications previously tried: NA  ?-[name] Pap in progress. ?-Current home glucose readings ? Morning After Supper  ?16-May 150 156  ?15-May 110 148  ?14-May 119 143  ?13-May 110 143  ?12-May 143 171  ?11-May  161  ?Average 126 154  ?-Denies hypoglycemic/hyperglycemic symptoms  ?-Current meal patterns: Cut down on cheerios.  ?breakfast: Typically skips  ?lunch: Smaller portions ?dinner: Glucerna OR soup + crackers + apple    ?snacks: Rarely snacks, sometimes will have sugar-free ice pop.    ?drinks: Water mostly.  ?--Current exercise patterns: Band exercises 3x weekly, 15 minutes each.  ?-Patient has not yet heard from [name] regarding [name] PAP. Will call and request update on application status.  ?-Refill of Farxiga sent into [name] today. ?-Continue current medications ? ?Patient Goals/Self-Care Activities ?Patient will:  ?- check glucose daily before breakfast, document, and provide at future appointments ?check blood pressure weekly, document, and provide at future appointments ?target a minimum of 150 minutes of moderate intensity exercise weekly ? ?Follow Up Plan: Telephone follow up appointment with care management team member scheduled for:  09/14/2021 at 11:00 AM ?  ? ?Patient agreed to services and verbal consent obtained.  ? ?Patient verbalizes understanding of instructions and care plan provided today and agrees to view in [place]. Active MyChart status confirmed with patient.   ? ?[name], PharmD, BCACP, CPP  ?Clinical Pharmacist Practitioner  ?[place] ?[phone]  ?

## 2021-09-14 NOTE — Progress Notes (Signed)
    Chronic Care Management Pharmacy Assistant   Name: Carol Ramsey  MRN: [id number] DOB: 03/09/1946  Patient Assistance Application Follow-up for [name]  I received a task from the CPP advising patient still has not heard a decision regarding her [place] with [name]. CPP requested that I give [name] a call and find out the status of the patient's application.  I contacted [name] @ [phone] and spoke with the representative who advised that the patient has been approved since 08/09/2021 and they shipped her first shipment already which should have been delivered to her home address on 08/15/2021. Tracking number provided for that delivery is [id number].  I contacted the patient, and she advised that she did receive a shipment in April but it was only a 90-Day supply and it should have been for 180 DS since she takes this medication twice daily.  I called [name] back and was transferred to the pharmacy to correct the mistake that was made on the patient's application and provide a verbal for the correct amount. The pharmacist Okey Dupre advised he would get this updated for the patient and they will send her a new shipment out.   I spoke with the patient and provided her with the update. CPP has been notified as well.   Medications: Outpatient Encounter Medications as of 09/14/2021  Medication Sig   acetaminophen (TYLENOL) 500 MG tablet Take 500 mg by mouth every 6 (six) hours as needed for mild pain or moderate pain.   ascorbic acid (VITAMIN C) 500 MG tablet Take 500 mg by mouth daily.   atorvastatin (LIPITOR) 10 MG tablet Take 10 mg by mouth daily at 6 PM.    Cholecalciferol (VITAMIN D) 125 MCG (5000 UT) CAPS Take 5,000 Units by mouth daily.   Cyanocobalamin (VITAMIN B-12) 5000 MCG SUBL Place 5,000 mcg under the tongue daily.   dapagliflozin propanediol (FARXIGA) 10 MG TABS tablet Take 1 tablet (10 mg total) by mouth daily. Patient receives through [name] Patient Assistance    fluticasone (FLONASE) 50 MCG/ACT nasal spray Place 2 sprays into both nostrils daily. (Patient not taking: Reported on 06/07/2021)   furosemide (LASIX) 20 MG tablet Take 1 tablet (20 mg total) by mouth daily as needed for edema.   glipiZIDE (GLUCOTROL XL) 5 MG 24 hr tablet TAKE 1 TABLET BY MOUTH DAILY   glucose blood (ONETOUCH VERIO) test strip TEST BLOOD SUGAR TWICE DAILY AS INSTRUCTED   lisinopril-hydrochlorothiazide (ZESTORETIC) 20-12.5 MG tablet TAKE 1 TABLET BY MOUTH EVERY MORNING   Magnesium 500 MG CAPS Take 1 capsule (500 mg total) by mouth at bedtime.   metoprolol succinate (TOPROL-XL) 100 MG 24 hr tablet TAKE 1 TABLET BY MOUTH DAILY WITH OR IMMEDIATELY FOLLOWING A MEAL   prednisoLONE acetate (PRED FORTE) 1 % ophthalmic suspension    sitaGLIPtin-metformin ([name]) 50-1000 MG tablet Take 1 tablet by mouth 2 (two) times daily with a meal. Patient receives through [name] Patient Assistance through Mar 2023.   tamoxifen (NOLVADEX) 20 MG tablet TAKE 1 TABLET(20 MG) BY MOUTH DAILY   triamcinolone cream (KENALOG) 0.1 % SMARTSIG:1 Application Topical 2-3 Times Daily (Patient not taking: Reported on 06/07/2021)   No facility-administered encounter medications on file as of 09/14/2021.    [name], CPA/CMA Clinical Pharmacist Assistant Phone: [phone]

## 2021-09-14 NOTE — Progress Notes (Signed)
Chronic Care Management Pharmacy Note  09/14/2021 Name:  Carol Ramsey MRN:  [id number] DOB:  [birth year]  Summary: Patient presents for CCM follow-up.  -Blood sugars with significant improvement following dietary changes.  -Patient has not yet heard from [name] regarding [name] PAP. Will call and request update on application status.   Recommendations/Changes made from today's visit: -Continue current medications -Refill of Farxiga sent into [name] today.  Plan: CPP follow-up 2 months   Subjective: Carol Ramsey is an 76 y.o. year old female who is a primary patient of Jerrol Banana., MD.  The CCM team was consulted for assistance with disease management and care coordination needs.    Engaged with patient by telephone for follow up visit in response to provider referral for pharmacy case management and/or care coordination services.   Consent to Services:  The patient was given information about Chronic Care Management services, agreed to services, and gave verbal consent prior to initiation of services.  Please see initial visit note for detailed documentation.   Patient Care Team: Jerrol Banana., MD as PCP - General (Family Medicine) Marry Guan, Laurice Record, MD as Consulting Physician (Orthopedic Surgery) Dasher, Rayvon Char, MD as Consulting Physician (Dermatology) Corey Skains, MD as Consulting Physician (Cardiology) Thelma Comp, [place] as Consulting Physician (Optometry) Bary Castilla, Forest Gleason, MD (General Surgery) Noreene Filbert, MD as Referring Physician (Radiation Oncology) Lloyd Huger, MD as Consulting Physician (Oncology) Germaine Pomfret, [facility] (Pharmacist)  Recent office visits: 06/07/21: Patient presented to Dr. Rosanna Randy for follow-up.A1c worsened to 9.9%  Recent consult visits: 06/28/21: Patient presented to Jettie Booze, [place] (Cardiology) for follow-up.   Hospital visits: None in previous 6 months  Objective:  Lab Results  Component Value  Date   CREATININE 1.58 (H) 06/07/2021   BUN 39 (H) 06/07/2021   GFRNONAA 45 (L) 02/19/2020   GFRAA 52 (L) 02/19/2020   NA 140 06/07/2021   K 4.7 06/07/2021   CALCIUM 9.2 06/07/2021   CO2 23 06/07/2021   GLUCOSE 192 (H) 06/07/2021    Lab Results  Component Value Date/Time   HGBA1C 9.9 (H) 06/07/2021 11:04 AM   HGBA1C 7.8 (A) 02/12/2021 11:34 AM   HGBA1C 9.0 (A) 11/05/2020 11:20 AM   HGBA1C 7.6 (H) 02/19/2020 10:56 AM    Last diabetic Eye exam:  Lab Results  Component Value Date/Time   HMDIABEYEEXA Retinopathy (A) 12/31/2020 12:00 AM    Last diabetic Foot exam: No results found for: HMDIABFOOTEX   Lab Results  Component Value Date   CHOL 106 06/07/2021   HDL 48 06/07/2021   LDLCALC 35 06/07/2021   TRIG 129 06/07/2021   CHOLHDL 2.2 06/07/2021       Latest Ref Rng & Units 06/07/2021   11:04 AM 02/19/2020   10:56 AM 05/30/2019   10:13 AM  Hepatic Function  Total Protein 6.0 - 8.5 g/dL 5.9   6.6     Albumin 3.7 - 4.7 g/dL 3.6   3.9   3.7    AST 0 - 40 IU/L 16   15     ALT 0 - 32 IU/L 5   8     Alk Phosphatase 44 - 121 IU/L 31   53     Total Bilirubin 0.0 - 1.2 mg/dL 0.4   0.5       Lab Results  Component Value Date/Time   TSH 3.910 06/07/2021 11:04 AM   TSH 4.800 (H) 02/19/2020 10:56 AM  Latest Ref Rng & Units 06/07/2021   11:04 AM 02/19/2020   10:56 AM 05/30/2019   10:13 AM  CBC  WBC 3.4 - 10.8 x10E3/uL 8.2   9.2   7.7    Hemoglobin 11.1 - 15.9 g/dL 11.1   11.7   10.8    Hematocrit 34.0 - 46.6 % 34.9   35.5   33.8    Platelets 150 - 450 x10E3/uL 223   200   194      No results found for: VD25OH  Clinical ASCVD: No  The ASCVD Risk score (Arnett DK, et al., 2019) failed to calculate for the following reasons:   The valid total cholesterol range is 130 to 320 mg/dL       06/07/2021   10:23 AM 06/01/2020   11:17 AM 04/02/2020   11:22 AM  Depression screen PHQ 2/9  Decreased Interest 0 0 0  Down, Depressed, Hopeless 0 0 0  PHQ - 2 Score 0 0 0     Social History   Tobacco Use  Smoking Status Never  Smokeless Tobacco Never   BP Readings from Last 3 Encounters:  06/07/21 120/64  05/11/21 117/79  02/04/21 (!) 133/46   Pulse Readings from Last 3 Encounters:  06/07/21 65  05/11/21 (!) 54  02/04/21 (!) 53   Wt Readings from Last 3 Encounters:  06/07/21 189 lb 4.8 oz (85.9 kg)  05/11/21 191 lb (86.6 kg)  02/04/21 198 lb (89.8 kg)   BMI Readings from Last 3 Encounters:  06/07/21 33.53 kg/m  05/11/21 33.83 kg/m  02/04/21 35.07 kg/m    Assessment/Interventions: Review of patient past medical history, allergies, medications, health status, including review of consultants reports, laboratory and other test data, was performed as part of comprehensive evaluation and provision of chronic care management services.   SDOH:  (Social Determinants of Health) assessments and interventions performed: Yes      SDOH Screenings   Alcohol Screen: Low Risk    Last Alcohol Screening Score (AUDIT): 0  Depression (PHQ2-9): Low Risk    PHQ-2 Score: 0  Financial Resource Strain: High Risk   Difficulty of Paying Living Expenses: Hard  Food Insecurity: No Food Insecurity   Worried About Charity fundraiser in the Last Year: Never true   Ran Out of Food in the Last Year: Never true  Housing: Low Risk    Last Housing Risk Score: 0  Physical Activity: Insufficiently Active   Days of Exercise per Week: 2 days   Minutes of Exercise per Session: 20 min  Social Connections: Socially Isolated   Frequency of Communication with Friends and Family: Never   Frequency of Social Gatherings with Friends and Family: Once a week   Attends Religious Services: Never   Marine scientist or Organizations: No   Attends Music therapist: Never   Marital Status: Married  Stress: No Stress Concern Present   Feeling of Stress : Not at all  Tobacco Use: Low Risk    Smoking Tobacco Use: Never   Smokeless Tobacco Use: Never   Passive  Exposure: Not on file  Transportation Needs: No Transportation Needs   Lack of Transportation (Medical): No   Lack of Transportation (Non-Medical): No    CCM Care Plan  Allergies  Allergen Reactions   Gabapentin Swelling   Quinine Nausea Only and Nausea And Vomiting   Amoxicillin Itching and Rash    Has patient had a PCN reaction causing immediate rash, facial/tongue/throat swelling, SOB  or lightheadedness with hypotension: No Has patient had a PCN reaction causing severe rash involving mucus membranes or skin necrosis: No Has patient had a PCN reaction that required hospitalization No Has patient had a PCN reaction occurring within the last 10 years: No If all of the above answers are "NO", then may proceed with Cephalosporin use.    Dilaudid  [Hydromorphone Hcl] Rash   Etodolac Rash   Hydrochlorothiazide Rash   Hydrocodone Rash   Hydromorphone Rash   Sulfa Antibiotics Rash    Medications Reviewed Today     Reviewed by Dionisio David, LPN (Licensed Practical Nurse) on 06/07/21 at 1038  Med List Status: <None>   Medication Order Taking? Sig Documenting Provider Last Dose Status Informant  acetaminophen (TYLENOL) 500 MG tablet [id number] Yes Take 500 mg by mouth every 6 (six) hours as needed for mild pain or moderate pain. [provider] Taking Active Self  ascorbic acid (VITAMIN C) 500 MG tablet [id number] Yes Take 500 mg by mouth daily. [provider] Taking Active   atorvastatin (LIPITOR) 10 MG tablet [id number] Yes Take 10 mg by mouth daily at 6 PM.  [provider] Taking Active Self           Med Note Michaelle Birks, Gwendalynn Eckstrom A   Tue Apr 28, 2020  1:09 PM)    benzonatate (TESSALON) 100 MG capsule [id number] No Take 1 capsule (100 mg total) by mouth 3 (three) times daily as needed.  Patient not taking: Reported on 05/11/2021   Mar Daring, PA-C Not Taking Active   Cholecalciferol (VITAMIN D) 125 MCG (5000 UT) CAPS [id number] Yes Take 5,000 Units  by mouth daily. [provider] Taking Active Self           Med Note Michaelle Birks, Cathe Mons A   Tue Apr 28, 2020  1:10 PM)    Cyanocobalamin (VITAMIN B-12) 5000 MCG SUBL [id number] Yes Place 5,000 mcg under the tongue daily. [provider] Taking Active   dapagliflozin propanediol (FARXIGA) 10 MG TABS tablet [id number] Yes Take 1 tablet (10 mg total) by mouth daily. Patient receives through [place]  Patient Assistance Jerrol Banana., MD Taking Active   fluticasone [facility]) 50 MCG/ACT nasal spray [id number] No Place 2 sprays into both nostrils daily.  Patient not taking: Reported on 06/07/2021   Mar Daring, PA-C Not Taking Active   furosemide (LASIX) 20 MG tablet [id number] Yes Take 1 tablet (20 mg total) by mouth daily as needed for edema. Jerrol Banana., MD Taking Active   glipiZIDE (GLUCOTROL XL) 5 MG 24 hr tablet [id number] Yes TAKE 1 TABLET BY MOUTH DAILY Jerrol Banana., MD Taking Active   glucose blood [facility] VERIO) test strip [id number] Yes Use as instructed; Check blood sugar twice daily Jerrol Banana., MD Taking Active   guaiFENesin [facility]) 600 MG 12 hr tablet [id number] No Take 1 tablet (600 mg total) by mouth 2 (two) times daily.  Patient not taking: Reported on 05/17/2021   Mar Daring, PA-C Not Taking Active   lisinopril-hydrochlorothiazide (ZESTORETIC) 20-12.5 MG tablet [id number] Yes Take 1 tablet by mouth every morning. Jerrol Banana., MD Taking Active   Magnesium 500 MG CAPS [id number]  Take 1 capsule (500 mg total) by mouth at bedtime. Milinda Pointer, MD  Expired 12/28/20 2359   metoprolol succinate (TOPROL-XL) 100 MG 24 hr tablet [id number] Yes TAKE 1 TABLET BY MOUTH DAILY WITH OR IMMEDIATELY FOLLOWING A MEAL Jerrol Banana.,  MD Taking Active   prednisoLONE acetate (PRED FORTE) 1 % ophthalmic suspension [id number] Yes  [provider] Taking Active   sitaGLIPtin-metformin ([name]) 50-1000 MG  tablet [id number] Yes Take 1 tablet by mouth 2 (two) times daily with a meal. Patient receives through [name] Patient Assistance through Mar 2023. Jerrol Banana., MD Taking Active   tamoxifen (NOLVADEX) 20 MG tablet [id number] Yes TAKE 1 TABLET(20 MG) BY MOUTH DAILY [place] Kathlene November, MD Taking Active   triamcinolone cream (KENALOG) 0.1 % [id number] No SMARTSIG:1 Application Topical 2-3 Times Daily  Patient not taking: Reported on 06/07/2021   [provider] Not Taking Active   Med List Note Hart Rochester, RN 02/01/18 1118): UDS 02-01-2018   New patient            Patient Active Problem List   Diagnosis Date Noted   Moderate tricuspid regurgitation 05/25/2020   OA (osteoarthritis) of knee 10/30/2018   Lymphedema 10/30/2018   DDD (degenerative disc disease), lumbar 02/28/2018   Osteoarthritis of sacroiliac joint (Left) 02/28/2018   Somatic dysfunction of sacroiliac joint (Left) 02/28/2018   Sacroiliac joint dysfunction (Left) 02/28/2018   Abnormal MRI, lumbar spine (07/22/2013) 02/28/2018   Lumbar facet syndrome (Bilateral) 02/28/2018   Lumbar spondylosis 02/28/2018   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) 02/28/2018   Lumbar facet arthropathy (Bilateral) 02/28/2018   Lumbar intervertebral disc protrusion 02/28/2018   Lumbar nerve root compression 02/28/2018   Hypoalbuminemia 02/27/2018   Hypomagnesemia 02/27/2018   Vitamin B 12 deficiency 02/27/2018   Low serum vitamin B12 02/06/2018   Chronic low back pain (Primary Area of Pain) (Bilateral) w/ sciatica (Left) 02/01/2018   Chronic lower extremity pain (Secondary Area of Pain) (Left) 02/01/2018   Chronic pain syndrome 02/01/2018   Pharmacologic therapy 02/01/2018   Disorder of skeletal system 02/01/2018   Problems influencing health status 02/01/2018   Chronic sacroiliac joint pain (Left) 02/01/2018   Bilateral leg edema 02/21/2017   CKD (chronic kidney disease) stage 3, GFR 30-59 ml/min ([place]) 07/21/2016    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   LVH (left ventricular hypertrophy) due to hypertensive disease, without heart failure 01/19/2016   Pulmonary hypertension ([place]) 02/10/2015   Acquired hypothyroidism 09/17/2014   Adaptation reaction 09/17/2014   Absolute anemia 09/17/2014   Cardiac dysrhythmia 09/17/2014   Lumbar facet hypertrophy 09/17/2014   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 09/17/2014   Type 2 diabetes mellitus with stage 3a chronic kidney disease, without long-term current use of insulin ([place]) 09/17/2014   Essential (primary) hypertension 09/17/2014   HLD (hyperlipidemia) 09/17/2014   Extreme obesity 09/17/2014   Vitamin D insufficiency 09/17/2014   Beat, premature ventricular 12/11/2013   OBESITY 11/14/2007   Achilles bursitis or tendinitis 11/14/2007   Calcaneal bursitis (heel), unspecified laterality 06/26/2007    Immunization History  Administered Date(s) Administered   Fluad Quad(high Dose 65+) 01/29/2019, 01/30/2020   Influenza Split 02/25/2006, 03/07/2011, 02/27/2012   Influenza, High Dose Seasonal PF 03/03/2016, 01/26/2017, 01/08/2018   Influenza,inj,Quad PF,6+ Mos 03/04/2013   PFIZER(Purple Top)SARS-COV-2 Vaccination 06/11/2019, 07/02/2019, 01/31/2020   Pneumococcal Conjugate-13 04/13/2015   Pneumococcal Polysaccharide-23 02/17/2007, 02/27/2012   Td 10/21/2003   Tdap 05/11/2016   Zoster, Live 11/08/2010    Conditions to be addressed/monitored:  Hypertension, Hyperlipidemia, Diabetes, Chronic Kidney Disease, Hypothyroidism and Chronic Pain and History of Breast Cancer  Care Plan : General Pharmacy (Adult)  Updates made by Germaine Pomfret, [place] since 09/14/2021 12:00 AM  Problem: Hypertension, Hyperlipidemia, Diabetes, Chronic Kidney Disease, Hypothyroidism and Chronic Pain and History of Breast Cancer   Priority: High     Long-Range Goal: Patient-Specific Goal   Start Date: 08/03/2020  Expected End Date: 09/15/2022   This Visit's Progress: On track  Recent Progress: On track  Priority: High  Note:   Current Barriers:  Unable to independently afford treatment regimen Unable to achieve control of diabetes   Pharmacist Clinical Goal(s):  Patient will verbalize ability to afford treatment regimen achieve control of diabetes as evidenced by A1c less than 8% through collaboration with PharmD and provider.   Interventions: 1:1 collaboration with Jerrol Banana., MD regarding development and update of comprehensive plan of care as evidenced by provider attestation and co-signature Inter-disciplinary care team collaboration (see longitudinal plan of care) Comprehensive medication review performed; medication list updated in electronic medical record  Hypertension (BP goal <140/90) -Controlled -Current treatment: Furosemide 20 mg daily: Appropriate, Effective, Safe, Accessible  Lisinopril-HCTZ 20-12.5 mg daily: Appropriate, Effective, Safe, Accessible  Metoprolol XL 100 mg daily: Appropriate, Effective, Safe, Accessible  -Medications previously tried: NA  -Current home readings:   110/63; Pulse 66   104/56; Pulse 57   121/64; pulse 50 -Denies hypotensive/hypertensive symptoms -Continue current medications  Hyperlipidemia: (LDL goal < 70) -Controlled: Not addressed during this visit. -Current treatment: Atorvastatin 10 mg daily  -Medications previously tried: NA  -Current dietary patterns: Trying to eat more vegetables, glucerna shakes, eating less overall.  -Current exercise habits: Physical Therapy exercises 3 days  -Educated on Importance of limiting foods high in cholesterol; Exercise goal of 150 minutes per week; -Recommended to continue current medication  Diabetes (A1c goal <8%) -Uncontrolled, but improved.  -Current medications: Farxiga 10 mg daily: Appropriate, Effective, Safe, Accessible Glipizide XL 5 mg daily: Appropriate, Effective, Safe, Accessible   [name] 50-1000 mg  twice daily: Appropriate, Effective, Safe, Query accessible -Medications previously tried: NA  -[name] Pap in progress. -Current home glucose readings  Morning After Supper  16-May 150 156  15-May 110 148  14-May 119 143  13-May 110 143  12-May 143 171  11-May  161  Average 126 154  -Denies hypoglycemic/hyperglycemic symptoms  -Current meal patterns: Cut down on cheerios.  breakfast: Typically skips  lunch: Smaller portions dinner: Glucerna OR soup + crackers + apple   snacks: Rarely snacks, sometimes will have sugar-free ice pop.    drinks: Water mostly.  --Current exercise patterns: Band exercises 3x weekly, 15 minutes each.  -Patient has not yet heard from [name] regarding [name] PAP. Will call and request update on application status.  -Refill of Farxiga sent into [name] today. -Continue current medications  Patient Goals/Self-Care Activities Patient will:  - check glucose daily before breakfast, document, and provide at future appointments check blood pressure weekly, document, and provide at future appointments target a minimum of 150 minutes of moderate intensity exercise weekly  Follow Up Plan: Telephone follow up appointment with care management team member scheduled for:  09/14/2021 at 11:00 AM    Medication Assistance: Application for [name]  medication assistance program. in process.  Anticipated assistance start date TBD.  See plan of care for additional detail. [name] obtained through [name] medication assistance program.  Renewal prescription sent in for 2023.   Patient's preferred pharmacy is:  [id number] Phillip Heal, [place] AT [facility] OF [address] [place] [address] Phone: [phone] Fax: [phone]   Uses pill  box? Yes Pt endorses 80% compliance  We discussed: Current pharmacy is preferred with insurance plan and patient is satisfied with pharmacy services Patient decided to: Continue current  medication management strategy  Care Plan and Follow Up Patient Decision:  Patient agrees to Care Plan and Follow-up.  Plan: Telephone follow up appointment with care management team member scheduled for:  09/14/2021 at 11:00 AM  [name], PharmD, [place], [place] [phone]

## 2021-09-29 DIAGNOSIS — E1122 Type 2 diabetes mellitus with diabetic chronic kidney disease: Secondary | ICD-10-CM

## 2021-09-29 DIAGNOSIS — I10 Essential (primary) hypertension: Secondary | ICD-10-CM

## 2021-09-29 DIAGNOSIS — N1831 Chronic kidney disease, stage 3a: Secondary | ICD-10-CM

## 2021-09-30 DIAGNOSIS — M1711 Unilateral primary osteoarthritis, right knee: Secondary | ICD-10-CM | POA: Diagnosis not present

## 2021-10-06 ENCOUNTER — Encounter: Payer: Self-pay | Admitting: Family Medicine

## 2021-10-06 ENCOUNTER — Ambulatory Visit (INDEPENDENT_AMBULATORY_CARE_PROVIDER_SITE_OTHER): Payer: Medicare Other | Admitting: Family Medicine

## 2021-10-06 VITALS — BP 150/69 | HR 55 | Resp 16 | Wt 188.0 lb

## 2021-10-06 DIAGNOSIS — I10 Essential (primary) hypertension: Secondary | ICD-10-CM | POA: Diagnosis not present

## 2021-10-06 DIAGNOSIS — M9903 Segmental and somatic dysfunction of lumbar region: Secondary | ICD-10-CM | POA: Diagnosis not present

## 2021-10-06 DIAGNOSIS — I890 Lymphedema, not elsewhere classified: Secondary | ICD-10-CM

## 2021-10-06 DIAGNOSIS — E1122 Type 2 diabetes mellitus with diabetic chronic kidney disease: Secondary | ICD-10-CM | POA: Diagnosis not present

## 2021-10-06 DIAGNOSIS — D0511 Intraductal carcinoma in situ of right breast: Secondary | ICD-10-CM

## 2021-10-06 DIAGNOSIS — M5136 Other intervertebral disc degeneration, lumbar region: Secondary | ICD-10-CM | POA: Diagnosis not present

## 2021-10-06 DIAGNOSIS — N1831 Chronic kidney disease, stage 3a: Secondary | ICD-10-CM

## 2021-10-06 DIAGNOSIS — G8929 Other chronic pain: Secondary | ICD-10-CM

## 2021-10-06 DIAGNOSIS — E039 Hypothyroidism, unspecified: Secondary | ICD-10-CM | POA: Diagnosis not present

## 2021-10-06 DIAGNOSIS — E668 Other obesity: Secondary | ICD-10-CM | POA: Diagnosis not present

## 2021-10-06 DIAGNOSIS — M9905 Segmental and somatic dysfunction of pelvic region: Secondary | ICD-10-CM | POA: Diagnosis not present

## 2021-10-06 DIAGNOSIS — M5451 Vertebrogenic low back pain: Secondary | ICD-10-CM | POA: Diagnosis not present

## 2021-10-06 DIAGNOSIS — M5416 Radiculopathy, lumbar region: Secondary | ICD-10-CM | POA: Diagnosis not present

## 2021-10-06 DIAGNOSIS — M79605 Pain in left leg: Secondary | ICD-10-CM

## 2021-10-06 DIAGNOSIS — E119 Type 2 diabetes mellitus without complications: Secondary | ICD-10-CM | POA: Diagnosis not present

## 2021-10-06 DIAGNOSIS — R600 Localized edema: Secondary | ICD-10-CM | POA: Diagnosis not present

## 2021-10-06 DIAGNOSIS — M7918 Myalgia, other site: Secondary | ICD-10-CM | POA: Diagnosis not present

## 2021-10-06 DIAGNOSIS — M25552 Pain in left hip: Secondary | ICD-10-CM | POA: Diagnosis not present

## 2021-10-06 DIAGNOSIS — M5442 Lumbago with sciatica, left side: Secondary | ICD-10-CM

## 2021-10-06 LAB — POCT GLYCOSYLATED HEMOGLOBIN (HGB A1C)
Est. average glucose Bld gHb Est-mCnc: 192
Hemoglobin A1C: 8.3 % — AB (ref 4.0–5.6)

## 2021-10-06 MED ORDER — GLIPIZIDE ER 10 MG PO TB24: 10.0000 mg | ORAL_TABLET | Freq: Every day | ORAL | 3 refills | Status: AC

## 2021-10-06 NOTE — Progress Notes (Signed)
Established patient visit  I,[name],acting as a scribe for Carol Durie, MD.,have documented all relevant documentation on the behalf of Carol Durie, MD,as directed by  Carol Durie, MD while in the presence of Carol Durie, MD.   Patient: Carol Ramsey   DOB: 12-14-1945   76 y.o. Female  MRN: [id number] she comes in today for follow-up.  She is lost more than 100 pounds overall since she started working on it years ago.  She is Visit Date: 10/06/2021  Today's healthcare provider: [name], MD   Chief Complaint  Patient presents with   Follow-up   Diabetes   Hypertension   Subjective    HPI  Patient comes in today for follow-up.  Overall she is doing well and she says she has lost more than 100 pounds since she started working with diet and exercise couple of years ago. He is having no hypoglycemia with her diabetes. She states home blood pressure readings are pretty good.   Diabetes Mellitus Type II, follow-up  Lab Results  Component Value Date   HGBA1C 8.3 (A) 10/06/2021   HGBA1C 9.9 (H) 06/07/2021   HGBA1C 7.8 (A) 02/12/2021   Last seen for diabetes 4 months ago.  Management since then includes continuing the same treatment.  Home blood sugar records: fasting range: 119, 126, 121 Most Recent Eye Exam: 12/31/2020  --------------------------------------------------------------------------------------------------- Hypertension, follow-up  BP Readings from Last 3 Encounters:  10/06/21 (!) 150/69  06/07/21 120/64  05/11/21 117/79   Wt Readings from Last 3 Encounters:  10/06/21 188 lb (85.3 kg)  06/07/21 189 lb 4.8 oz (85.9 kg)  05/11/21 191 lb (86.6 kg)     She was last seen for hypertension 4 months ago.  Management since that visit includes continuing the same treatment..  Outside blood pressures are 99/60,  91/55.  ---------------------------------------------------------------------------------------------------   Medications: Outpatient Medications Prior to Visit  Medication Sig   acetaminophen (TYLENOL) 500 MG tablet Take 500 mg by mouth every 6 (six) hours as needed for mild pain or moderate pain.   ascorbic acid (VITAMIN C) 500 MG tablet Take 500 mg by mouth daily.   atorvastatin (LIPITOR) 10 MG tablet Take 10 mg by mouth daily at 6 PM.    Cholecalciferol (VITAMIN D) 125 MCG (5000 UT) CAPS Take 5,000 Units by mouth daily.   Cyanocobalamin (VITAMIN B-12) 5000 MCG SUBL Place 5,000 mcg under the tongue daily.   dapagliflozin propanediol (FARXIGA) 10 MG TABS tablet Take 1 tablet (10 mg total) by mouth daily. Patient receives through [name] Patient Assistance   fluticasone (FLONASE) 50 MCG/ACT nasal spray Place 2 sprays into both nostrils daily.   furosemide (LASIX) 20 MG tablet Take 1 tablet (20 mg total) by mouth daily as needed for edema.   glucose blood (ONETOUCH VERIO) test strip TEST BLOOD SUGAR TWICE DAILY AS INSTRUCTED   lisinopril-hydrochlorothiazide (ZESTORETIC) 20-12.5 MG tablet TAKE 1 TABLET BY MOUTH EVERY MORNING   metoprolol succinate (TOPROL-XL) 100 MG 24 hr tablet TAKE 1 TABLET BY MOUTH DAILY WITH OR IMMEDIATELY FOLLOWING A MEAL   prednisoLONE acetate (PRED FORTE) 1 % ophthalmic suspension    sitaGLIPtin-metformin ([name]) 50-1000 MG tablet Take 1 tablet by mouth 2 (two) times daily with a meal. Patient receives through [name] Patient Assistance through Mar 2023.   tamoxifen (NOLVADEX) 20 MG tablet TAKE 1 TABLET(20 MG) BY MOUTH DAILY   triamcinolone cream (KENALOG) 0.1 %    [DISCONTINUED] glipiZIDE (GLUCOTROL XL) 5  MG 24 hr tablet TAKE 1 TABLET BY MOUTH DAILY   Magnesium 500 MG CAPS Take 1 capsule (500 mg total) by mouth at bedtime.   No facility-administered medications prior to visit.    Review of Systems  Constitutional:  Negative for appetite change, chills, fatigue and  fever.  Respiratory:  Negative for chest tightness and shortness of breath.   Cardiovascular:  Negative for chest pain and palpitations.  Gastrointestinal:  Negative for abdominal pain, nausea and vomiting.  Neurological:  Negative for dizziness and weakness.      Objective    BP (!) 150/69 (BP Location: Right Arm, Patient Position: Sitting, Cuff Size: Normal)   Pulse (!) 55   Resp 16   Wt 188 lb (85.3 kg)   SpO2 99%   BMI 33.30 kg/m    Physical Exam Vitals and nursing note reviewed.  Constitutional:      Appearance: She is obese.  HENT:     Right Ear: Tympanic membrane normal.     Left Ear: Tympanic membrane normal.     Nose: Nose normal.     Mouth/Throat:     Mouth: Mucous membranes are moist.     Pharynx: Oropharynx is clear.  Eyes:     Conjunctiva/sclera: Conjunctivae normal.  Cardiovascular:     Rate and Rhythm: Normal rate and regular rhythm.     Pulses: Normal pulses.     Heart sounds: Normal heart sounds.  Pulmonary:     Effort: Pulmonary effort is normal.     Breath sounds: Normal breath sounds.  Abdominal:     General: Bowel sounds are normal.     Palpations: Abdomen is soft.  Musculoskeletal:     Cervical back: Normal range of motion and neck supple.     Comments: Lymphedema only today.  Skin:    General: Skin is warm and dry.     Comments: Very fair skin.  Neurological:     General: No focal deficit present.     Mental Status: She is alert and oriented to person, place, and time.  Psychiatric:        Mood and Affect: Mood normal.        Behavior: Behavior normal.        Thought Content: Thought content normal.        Judgment: Judgment normal.       Results for orders placed or performed in visit on 10/06/21  POCT glycosylated hemoglobin (Hb A1C)  Result Value Ref Range   Hemoglobin A1C 8.3 (A) 4.0 - 5.6 %   Est. average glucose Bld gHb Est-mCnc 192     Assessment & Plan     1. Type 2 diabetes mellitus with stage 3a chronic kidney  disease, without long-term current use of insulin ([name]) A1c has improved from 9.9-8.3. - POCT glycosylated hemoglobin (Hb A1C)  2. Essential (primary) hypertension Home blood pressure readings.  All of 161/09 but certainly would like to see it less than 140/90  3. Type 2 diabetes mellitus without complication, without long-term current use of insulin ([name])  - glipiZIDE (GLUCOTROL XL) 10 MG 24 hr tablet; Take 1 tablet (10 mg total) by mouth daily with breakfast. TAKE 1 TABLET BY MOUTH DAILY  Dispense: 90 tablet; Refill: 3  4. Morbid obesity ([place]) Patient she is lost a couple total of 100 pounds  5. Lymphedema   6. Acquired hypothyroidism   7. Ductal carcinoma in situ (DCIS) of right breast   8. Chronic low back pain (  Primary Area of Pain) (Bilateral) w/ sciatica (Left)   9. Extreme obesity   10. Chronic lower extremity pain (Secondary Area of Pain) (Left) Followed by orthopedics  11. Bilateral leg edema Proved with weight loss.   Return in about 3 months (around 01/11/2022).      I, Carol Durie, MD, have reviewed all documentation for this visit. The documentation on 10/15/21 for the exam, diagnosis, procedures, and orders are all accurate and complete.    Kenta Laster Cranford Mon, MD  [facility] [phone] (phone) [phone] (fax)  [place]

## 2021-11-03 DIAGNOSIS — M9905 Segmental and somatic dysfunction of pelvic region: Secondary | ICD-10-CM | POA: Diagnosis not present

## 2021-11-03 DIAGNOSIS — M9903 Segmental and somatic dysfunction of lumbar region: Secondary | ICD-10-CM | POA: Diagnosis not present

## 2021-11-03 DIAGNOSIS — M5451 Vertebrogenic low back pain: Secondary | ICD-10-CM | POA: Diagnosis not present

## 2021-11-03 DIAGNOSIS — M7918 Myalgia, other site: Secondary | ICD-10-CM | POA: Diagnosis not present

## 2021-11-03 DIAGNOSIS — M5416 Radiculopathy, lumbar region: Secondary | ICD-10-CM | POA: Diagnosis not present

## 2021-11-03 DIAGNOSIS — M25552 Pain in left hip: Secondary | ICD-10-CM | POA: Diagnosis not present

## 2021-11-03 DIAGNOSIS — M5136 Other intervertebral disc degeneration, lumbar region: Secondary | ICD-10-CM | POA: Diagnosis not present

## 2021-11-11 ENCOUNTER — Ambulatory Visit: Payer: Medicare Other | Admitting: Oncology

## 2021-11-15 ENCOUNTER — Ambulatory Visit (INDEPENDENT_AMBULATORY_CARE_PROVIDER_SITE_OTHER): Payer: Medicare Other

## 2021-11-15 DIAGNOSIS — I10 Essential (primary) hypertension: Secondary | ICD-10-CM

## 2021-11-15 DIAGNOSIS — N1831 Chronic kidney disease, stage 3a: Secondary | ICD-10-CM

## 2021-11-15 NOTE — Progress Notes (Signed)
Chronic Care Management Pharmacy Note  11/15/2021 Name:  Carol Ramsey MRN:  [id number] DOB:  1945/05/21  Summary: Patient presents for CCM follow-up.    Recommendations/Changes made from today's visit: -Continue current medications -Refill of Farxiga sent into [name] today.  Plan: CPP follow-up 2 months   Subjective: Carol Ramsey is an 76 y.o. year old female who is a primary patient of Jerrol Banana., MD.  The CCM team was consulted for assistance with disease management and care coordination needs.    Engaged with patient by telephone for follow up visit in response to provider referral for pharmacy case management and/or care coordination services.   Consent to Services:  The patient was given information about Chronic Care Management services, agreed to services, and gave verbal consent prior to initiation of services.  Please see initial visit note for detailed documentation.   Patient Care Team: Jerrol Banana., MD as PCP - General (Family Medicine) Marry Guan, Laurice Record, MD as Consulting Physician (Orthopedic Surgery) Dasher, Rayvon Char, MD as Consulting Physician (Dermatology) Corey Skains, MD as Consulting Physician (Cardiology) Thelma Comp, [place] as Consulting Physician (Optometry) Bary Castilla, Forest Gleason, MD (General Surgery) Noreene Filbert, MD as Referring Physician (Radiation Oncology) Lloyd Huger, MD as Consulting Physician (Oncology) Germaine Pomfret, [facility] (Pharmacist)  Recent office visits: 10/06/21: Patient presented to Dr. Rosanna Randy for follow-up. A1c 8.3%. Glipizide 10 mg daily. BP 150/69.  06/07/21: Patient presented to Dr. Rosanna Randy for follow-up.A1c worsened to 9.9%  Recent consult visits: 06/28/21: Patient presented to Jettie Booze, [place] (Cardiology) for follow-up.   Hospital visits: None in previous 6 months  Objective:  Lab Results  Component Value Date   CREATININE 1.58 (H) 06/07/2021   BUN 39 (H) 06/07/2021   GFRNONAA 45 (L)  02/19/2020   GFRAA 52 (L) 02/19/2020   NA 140 06/07/2021   K 4.7 06/07/2021   CALCIUM 9.2 06/07/2021   CO2 23 06/07/2021   GLUCOSE 192 (H) 06/07/2021    Lab Results  Component Value Date/Time   HGBA1C 8.3 (A) 10/06/2021 02:06 PM   HGBA1C 9.9 (H) 06/07/2021 11:04 AM   HGBA1C 7.8 (A) 02/12/2021 11:34 AM   HGBA1C 7.6 (H) 02/19/2020 10:56 AM    Last diabetic Eye exam:  Lab Results  Component Value Date/Time   HMDIABEYEEXA Retinopathy (A) 12/31/2020 12:00 AM    Last diabetic Foot exam: No results found for: "HMDIABFOOTEX"   Lab Results  Component Value Date   CHOL 106 06/07/2021   HDL 48 06/07/2021   LDLCALC 35 06/07/2021   TRIG 129 06/07/2021   CHOLHDL 2.2 06/07/2021       Latest Ref Rng & Units 06/07/2021   11:04 AM 02/19/2020   10:56 AM 05/30/2019   10:13 AM  Hepatic Function  Total Protein 6.0 - 8.5 g/dL 5.9  6.6    Albumin 3.7 - 4.7 g/dL 3.6  3.9  3.7   AST 0 - 40 IU/L 16  15    ALT 0 - 32 IU/L 5  8    Alk Phosphatase 44 - 121 IU/L 31  53    Total Bilirubin 0.0 - 1.2 mg/dL 0.4  0.5      Lab Results  Component Value Date/Time   TSH 3.910 06/07/2021 11:04 AM   TSH 4.800 (H) 02/19/2020 10:56 AM       Latest Ref Rng & Units 06/07/2021   11:04 AM 02/19/2020   10:56 AM 05/30/2019   10:13 AM  CBC  WBC 3.4 - 10.8 x10E3/uL 8.2  9.2  7.7   Hemoglobin 11.1 - 15.9 g/dL 11.1  11.7  10.8   Hematocrit 34.0 - 46.6 % 34.9  35.5  33.8   Platelets 150 - 450 x10E3/uL 223  200  194     No results found for: "VD25OH"  Clinical ASCVD: No  The ASCVD Risk score (Arnett DK, et al., 2019) failed to calculate for the following reasons:   The valid total cholesterol range is 130 to 320 mg/dL       06/07/2021   10:23 AM 06/01/2020   11:17 AM 04/02/2020   11:22 AM  Depression screen PHQ 2/9  Decreased Interest 0 0 0  Down, Depressed, Hopeless 0 0 0  PHQ - 2 Score 0 0 0    Social History   Tobacco Use  Smoking Status Never  Smokeless Tobacco Never   BP Readings from Last 3  Encounters:  10/06/21 (!) 150/69  06/07/21 120/64  05/11/21 117/79   Pulse Readings from Last 3 Encounters:  10/06/21 (!) 55  06/07/21 65  05/11/21 (!) 54   Wt Readings from Last 3 Encounters:  10/06/21 188 lb (85.3 kg)  06/07/21 189 lb 4.8 oz (85.9 kg)  05/11/21 191 lb (86.6 kg)   BMI Readings from Last 3 Encounters:  10/06/21 33.30 kg/m  06/07/21 33.53 kg/m  05/11/21 33.83 kg/m    Assessment/Interventions: Review of patient past medical history, allergies, medications, health status, including review of consultants reports, laboratory and other test data, was performed as part of comprehensive evaluation and provision of chronic care management services.   SDOH:  (Social Determinants of Health) assessments and interventions performed: Yes      SDOH Screenings   Alcohol Screen: Low Risk  (06/07/2021)   Alcohol Screen    Last Alcohol Screening Score (AUDIT): 0  Depression (PHQ2-9): Low Risk  (06/07/2021)   Depression (PHQ2-9)    PHQ-2 Score: 0  Financial Resource Strain: High Risk (07/09/2021)   Overall Financial Resource Strain (CARDIA)    Difficulty of Paying Living Expenses: Hard  Food Insecurity: No Food Insecurity (06/07/2021)   Hunger Vital Sign    Worried About Running Out of Food in the Last Year: Never true    Ran Out of Food in the Last Year: Never true  Housing: Low Risk  (06/07/2021)   Housing    Last Housing Risk Score: 0  Physical Activity: Insufficiently Active (06/07/2021)   Exercise Vital Sign    Days of Exercise per Week: 2 days    Minutes of Exercise per Session: 20 min  Social Connections: Socially Isolated (06/07/2021)   Social Connection and Isolation Panel [NHANES]    Frequency of Communication with Friends and Family: Never    Frequency of Social Gatherings with Friends and Family: Once a week    Attends Religious Services: Never    Marine scientist or Organizations: No    Attends Archivist Meetings: Never    Marital Status:  Married  Stress: No Stress Concern Present (06/07/2021)   [employer] [place] of Stress : Not at all  Tobacco Use: Low Risk  (10/06/2021)   Patient History    Smoking Tobacco Use: Never    Smokeless Tobacco Use: Never    Passive Exposure: Not on file  Transportation Needs: No Transportation Needs (06/07/2021)   PRAPARE - Hydrologist (Medical): No  Lack of Transportation (Non-Medical): No    CCM Care Plan  Allergies  Allergen Reactions   Gabapentin Swelling   Quinine Nausea Only and Nausea And Vomiting   Amoxicillin Itching and Rash    Has patient had a PCN reaction causing immediate rash, facial/tongue/throat swelling, SOB or lightheadedness with hypotension: No Has patient had a PCN reaction causing severe rash involving mucus membranes or skin necrosis: No Has patient had a PCN reaction that required hospitalization No Has patient had a PCN reaction occurring within the last 10 years: No If all of the above answers are "NO", then may proceed with Cephalosporin use.    Dilaudid  [Hydromorphone Hcl] Rash   Etodolac Rash   Hydrochlorothiazide Rash   Hydrocodone Rash   Hydromorphone Rash   Sulfa Antibiotics Rash    Medications Reviewed Today     Reviewed by Julieta Bellini, CMA (Certified Medical Assistant) on 10/06/21 at 1124  Med List Status: <None>   Medication Order Taking? Sig Documenting Provider Last Dose Status Informant  acetaminophen (TYLENOL) 500 MG tablet [id number] Yes Take 500 mg by mouth every 6 (six) hours as needed for mild pain or moderate pain. [provider] Taking Active Self  ascorbic acid (VITAMIN C) 500 MG tablet [id number] Yes Take 500 mg by mouth daily. [provider] Taking Active   atorvastatin (LIPITOR) 10 MG tablet [id number] Yes Take 10 mg by mouth daily at 6 PM.  [provider] Taking Active Self           Med Note Michaelle Birks,  Melony Tenpas A   Tue Apr 28, 2020  1:09 PM)    Cholecalciferol (VITAMIN D) 125 MCG (5000 UT) CAPS [id number] Yes Take 5,000 Units by mouth daily. [provider] Taking Active Self           Med Note Michaelle Birks, Cathe Mons A   Tue Apr 28, 2020  1:10 PM)    Cyanocobalamin (VITAMIN B-12) 5000 MCG SUBL [id number] Yes Place 5,000 mcg under the tongue daily. [provider] Taking Active   dapagliflozin propanediol (FARXIGA) 10 MG TABS tablet [id number] Yes Take 1 tablet (10 mg total) by mouth daily. Patient receives through [name] Patient Assistance Jerrol Banana., MD Taking Active   fluticasone [facility]) 50 MCG/ACT nasal spray [id number] Yes Place 2 sprays into both nostrils daily. Mar Daring, PA-C Taking Active   furosemide (LASIX) 20 MG tablet [id number] Yes Take 1 tablet (20 mg total) by mouth daily as needed for edema. Jerrol Banana., MD Taking Active   glipiZIDE (GLUCOTROL XL) 5 MG 24 hr tablet [id number] Yes TAKE 1 TABLET BY MOUTH DAILY Jerrol Banana., MD Taking Active   glucose blood [facility] VERIO) test strip [id number] Yes TEST BLOOD SUGAR TWICE DAILY AS INSTRUCTED Jerrol Banana., MD Taking Active   lisinopril-hydrochlorothiazide (ZESTORETIC) 20-12.5 MG tablet [id number] Yes TAKE 1 TABLET BY MOUTH EVERY MORNING Jerrol Banana., MD Taking Active   Magnesium 500 MG CAPS [id number]  Take 1 capsule (500 mg total) by mouth at bedtime. Milinda Pointer, MD  Expired 12/28/20 2359   metoprolol succinate (TOPROL-XL) 100 MG 24 hr tablet [id number] Yes TAKE 1 TABLET BY MOUTH DAILY WITH OR IMMEDIATELY FOLLOWING A MEAL Jerrol Banana., MD Taking Active   prednisoLONE acetate (PRED FORTE) 1 % ophthalmic suspension [id number] Yes  [provider] Taking Active   sitaGLIPtin-metformin ([name]) 50-1000 MG tablet [id number] Yes Take 1 tablet by mouth 2 (two) times daily  with a meal. Patient receives through [name] Patient Assistance through Mar  2023. Jerrol Banana., MD Taking Active   tamoxifen (NOLVADEX) 20 MG tablet [id number] Yes TAKE 1 TABLET(20 MG) BY MOUTH DAILY Grayland Ormond Kathlene November, MD Taking Active   triamcinolone cream (KENALOG) 0.1 % [id number] Yes  [provider] Taking Active   Med List Note Hart Rochester, RN 02/01/18 1118): UDS 02-01-2018   New patient            Patient Active Problem List   Diagnosis Date Noted   Moderate tricuspid regurgitation 05/25/2020   OA (osteoarthritis) of knee 10/30/2018   Lymphedema 10/30/2018   DDD (degenerative disc disease), lumbar 02/28/2018   Osteoarthritis of sacroiliac joint (Left) 02/28/2018   Somatic dysfunction of sacroiliac joint (Left) 02/28/2018   Sacroiliac joint dysfunction (Left) 02/28/2018   Abnormal MRI, lumbar spine (07/22/2013) 02/28/2018   Lumbar facet syndrome (Bilateral) 02/28/2018   Lumbar spondylosis 02/28/2018   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) 02/28/2018   Lumbar facet arthropathy (Bilateral) 02/28/2018   Lumbar intervertebral disc protrusion 02/28/2018   Lumbar nerve root compression 02/28/2018   Hypoalbuminemia 02/27/2018   Hypomagnesemia 02/27/2018   Vitamin B 12 deficiency 02/27/2018   Low serum vitamin B12 02/06/2018   Chronic low back pain (Primary Area of Pain) (Bilateral) w/ sciatica (Left) 02/01/2018   Chronic lower extremity pain (Secondary Area of Pain) (Left) 02/01/2018   Chronic pain syndrome 02/01/2018   Pharmacologic therapy 02/01/2018   Disorder of skeletal system 02/01/2018   Problems influencing health status 02/01/2018   Chronic sacroiliac joint pain (Left) 02/01/2018   Bilateral leg edema 02/21/2017   CKD (chronic kidney disease) stage 3, GFR 30-59 ml/min ([name]) 07/21/2016   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   LVH (left ventricular hypertrophy) due to hypertensive disease, without heart failure 01/19/2016   Pulmonary hypertension ([place]) 02/10/2015   Acquired hypothyroidism  09/17/2014   Adaptation reaction 09/17/2014   Absolute anemia 09/17/2014   Cardiac dysrhythmia 09/17/2014   Lumbar facet hypertrophy 09/17/2014   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 09/17/2014   Type 2 diabetes mellitus with stage 3a chronic kidney disease, without long-term current use of insulin ([place]) 09/17/2014   Essential (primary) hypertension 09/17/2014   HLD (hyperlipidemia) 09/17/2014   Extreme obesity 09/17/2014   Vitamin D insufficiency 09/17/2014   Beat, premature ventricular 12/11/2013   OBESITY 11/14/2007   Achilles bursitis or tendinitis 11/14/2007   Calcaneal bursitis (heel), unspecified laterality 06/26/2007    Immunization History  Administered Date(s) Administered   Fluad Quad(high Dose 65+) 01/29/2019, 01/30/2020   Influenza Split 02/25/2006, 03/07/2011, 02/27/2012   Influenza, High Dose Seasonal PF 03/03/2016, 01/26/2017, 01/08/2018   Influenza,inj,Quad PF,6+ Mos 03/04/2013   PFIZER(Purple Top)SARS-COV-2 Vaccination 06/11/2019, 07/02/2019, 01/31/2020   Pneumococcal Conjugate-13 04/13/2015   Pneumococcal Polysaccharide-23 02/17/2007, 02/27/2012   Td 10/21/2003   Tdap 05/11/2016   Zoster, Live 11/08/2010    Conditions to be addressed/monitored:  Hypertension, Hyperlipidemia, Diabetes, Chronic Kidney Disease, Hypothyroidism and Chronic Pain and History of Breast Cancer  Care Plan : General Pharmacy (Adult)  Updates made by Germaine Pomfret, RPH since 11/15/2021 12:00 AM     Problem: Hypertension, Hyperlipidemia, Diabetes, Chronic Kidney Disease, Hypothyroidism and Chronic Pain and History of Breast Cancer   Priority: High     Long-Range Goal: Patient-Specific Goal   Start Date: 08/03/2020  Expected End Date: 09/15/2022  Recent Progress: On track  Priority: High  Note:   Current Barriers:  Unable to independently afford treatment regimen Unable to achieve control of diabetes   Pharmacist Clinical Goal(s):  Patient will verbalize ability to  afford treatment regimen achieve control of diabetes as evidenced by A1c less than 8% through collaboration with PharmD and provider.   Interventions: 1:1 collaboration with Jerrol Banana., MD regarding development and update of comprehensive plan of care as evidenced by provider attestation and co-signature Inter-disciplinary care team collaboration (see longitudinal plan of care) Comprehensive medication review performed; medication list updated in electronic medical record  Hypertension (BP goal <140/90) -Controlled -Current treatment: Furosemide 20 mg daily: Appropriate, Effective, Safe, Accessible  Lisinopril-HCTZ 20-12.5 mg daily: Appropriate, Effective, Safe, Accessible  Metoprolol XL 100 mg daily: Appropriate, Effective, Safe, Accessible  -Medications previously tried: NA  -Current home readings:   110/63; Pulse 66   104/56; Pulse 57   121/64; pulse 50 -Denies hypotensive/hypertensive symptoms -Continue current medications  Hyperlipidemia: (LDL goal < 70) -Controlled: Not addressed during this visit. -Current treatment: Atorvastatin 10 mg daily  -Medications previously tried: NA  -Current dietary patterns: Trying to eat more vegetables, glucerna shakes, eating less overall.  -Current exercise habits: Physical Therapy exercises 3 days  -Educated on Importance of limiting foods high in cholesterol; Exercise goal of 150 minutes per week; -Recommended to continue current medication  Diabetes (A1c goal <8%) -Uncontrolled, but improving.  -Current medications: Farxiga 10 mg daily: Appropriate, Effective, Safe, Accessible Glipizide XL 10 mg daily: Appropriate, Effective, Safe, Accessible   [name] 50-1000 mg twice daily: Appropriate, Effective, Query Safe -Medications previously tried: NA  -[name] dose may be too high for patient's renal function but was not checked at last PCP follow-up.  -Current home glucose readings  Fasting PM   17-Jul 113   15-Jul 125 160   14-Jul 120 185  13-Jul 107 167  12-Jul 123 170  11-Jul 117 156  10-Jul 115 164  9-Jul  168  Average 117 167  -Denies hypoglycemic/hyperglycemic symptoms  -Current meal patterns: Cut down on cheerios.  breakfast: Typically skips  lunch: Smaller portions dinner: Glucerna OR soup + crackers + apple   snacks: Rarely snacks, sometimes will have sugar-free ice pop.    drinks: Water mostly, 3-4 glasses daily.  --Current exercise patterns: Band exercises 3x weekly, 15 minutes each. Walking in home on off days.  -Recheck CMP, Microalbumin this week.  -Continue current medications  Patient Goals/Self-Care Activities Patient will:  - check glucose daily before breakfast, document, and provide at future appointments check blood pressure weekly, document, and provide at future appointments target a minimum of 150 minutes of moderate intensity exercise weekly  Follow Up Plan: Telephone follow up appointment with care management team member scheduled for:  12/17/2021 at 11:00 AM     Medication Assistance: Application for [name]  medication assistance program. in process.  Anticipated assistance start date TBD.  See plan of care for additional detail. [name] obtained through [name] medication assistance program.  Renewal prescription sent in for 2023.   Patient's preferred pharmacy is:  [id number] [address], [place] AT [facility] OF [address] [place] [address] Phone: [phone] Fax: [phone]   Uses pill box? Yes Pt endorses 80% compliance  We discussed: Current pharmacy is preferred with insurance plan and patient is satisfied with pharmacy services Patient decided to: Continue current medication management strategy  Care Plan and Follow Up Patient Decision:  Patient agrees to Care Plan and Follow-up.  Plan: Telephone follow up appointment with care management team member scheduled for:  12/17/2021 at 11:00 AM  [name], PharmD, [place], [place] [phone]

## 2021-11-15 NOTE — Addendum Note (Signed)
Addended by: Daron Offer A on: 11/15/2021 11:58 AM   Modules accepted: Orders

## 2021-11-15 NOTE — Patient Instructions (Signed)
Visit Information It was great speaking with you today!  Please let me know if you have any questions about our visit.   Goals Addressed             This Visit's Progress    Monitor and Manage My Blood Sugar-Diabetes Type 2   On track    Timeframe:  Long-Range Goal Priority:  High Start Date:  08/03/2020                           Expected End Date: 02/03/2023                      Follow Up within 30 days   - check blood sugar twice daily (before breakfast and after supper) - check blood sugar if I feel it is too high or too low - enter blood sugar readings and medication or insulin into daily log - take the blood sugar log to all doctor visits    Why is this important?   Checking your blood sugar at home helps to keep it from getting very high or very low.  Writing the results in a diary or log helps the doctor know how to care for you.  Your blood sugar log should have the time, date and the results.  Also, write down the amount of insulin or other medicine that you take.  Other information, like what you ate, exercise done and how you were feeling, will also be helpful.     Notes:         Patient Care Plan: General Pharmacy (Adult)     Problem Identified: Hypertension, Hyperlipidemia, Diabetes, Chronic Kidney Disease, Hypothyroidism and Chronic Pain and History of Breast Cancer   Priority: High     Long-Range Goal: Patient-Specific Goal   Start Date: 08/03/2020  Expected End Date: 09/15/2022  Recent Progress: On track  Priority: High  Note:   Current Barriers:  Unable to independently afford treatment regimen Unable to achieve control of diabetes   Pharmacist Clinical Goal(s):  Patient will verbalize ability to afford treatment regimen achieve control of diabetes as evidenced by A1c less than 8% through collaboration with PharmD and provider.   Interventions: 1:1 collaboration with Jerrol Banana., MD regarding development and update of comprehensive plan  of care as evidenced by provider attestation and co-signature Inter-disciplinary care team collaboration (see longitudinal plan of care) Comprehensive medication review performed; medication list updated in electronic medical record  Hypertension (BP goal <140/90) -Controlled -Current treatment: Furosemide 20 mg daily: Appropriate, Effective, Safe, Accessible  Lisinopril-HCTZ 20-12.5 mg daily: Appropriate, Effective, Safe, Accessible  Metoprolol XL 100 mg daily: Appropriate, Effective, Safe, Accessible  -Medications previously tried: NA  -Current home readings:   110/63; Pulse 66   104/56; Pulse 57   121/64; pulse 50 -Denies hypotensive/hypertensive symptoms -Continue current medications  Hyperlipidemia: (LDL goal < 70) -Controlled: Not addressed during this visit. -Current treatment: Atorvastatin 10 mg daily  -Medications previously tried: NA  -Current dietary patterns: Trying to eat more vegetables, glucerna shakes, eating less overall.  -Current exercise habits: Physical Therapy exercises 3 days  -Educated on Importance of limiting foods high in cholesterol; Exercise goal of 150 minutes per week; -Recommended to continue current medication  Diabetes (A1c goal <8%) -Uncontrolled, but improving.  -Current medications: Farxiga 10 mg daily: Appropriate, Effective, Safe, Accessible Glipizide XL 10 mg daily: Appropriate, Effective, Safe, Accessible   [name] 50-1000 mg twice daily:  Appropriate, Effective, Query Safe -Medications previously tried: NA  -[name] dose may be too high for patient's renal function but was not checked at last PCP follow-up.  -Current home glucose readings  Fasting PM   17-Jul 113   15-Jul 125 160  14-Jul 120 185  13-Jul 107 167  12-Jul 123 170  11-Jul 117 156  10-Jul 115 164  9-Jul  168  Average 117 167  -Denies hypoglycemic/hyperglycemic symptoms  -Current meal patterns: Cut down on cheerios.  breakfast: Typically skips  lunch: Smaller  portions dinner: Glucerna OR soup + crackers + apple   snacks: Rarely snacks, sometimes will have sugar-free ice pop.    drinks: Water mostly, 3-4 glasses daily.  --Current exercise patterns: Band exercises 3x weekly, 15 minutes each. Walking in home on off days.  -Recheck CMP, Microalbumin this week.  -Continue current medications  Patient Goals/Self-Care Activities Patient will:  - check glucose daily before breakfast, document, and provide at future appointments check blood pressure weekly, document, and provide at future appointments target a minimum of 150 minutes of moderate intensity exercise weekly  Follow Up Plan: Telephone follow up appointment with care management team member scheduled for:  12/17/2021 at 11:00 AM    Patient agreed to services and verbal consent obtained.   Patient verbalizes understanding of instructions and care plan provided today and agrees to view in [place]. Active MyChart status and patient understanding of how to access instructions and care plan via MyChart confirmed with patient.     [name], PharmD, Para March, CPP  Clinical Pharmacist Practitioner  [facility] [phone]

## 2021-11-17 DIAGNOSIS — N1831 Chronic kidney disease, stage 3a: Secondary | ICD-10-CM | POA: Diagnosis not present

## 2021-11-17 DIAGNOSIS — I10 Essential (primary) hypertension: Secondary | ICD-10-CM | POA: Diagnosis not present

## 2021-11-17 DIAGNOSIS — E1122 Type 2 diabetes mellitus with diabetic chronic kidney disease: Secondary | ICD-10-CM | POA: Diagnosis not present

## 2021-11-18 LAB — MICROALBUMIN / CREATININE URINE RATIO
Creatinine, Urine: 75.4 mg/dL
Microalb/Creat Ratio: 5 mg/g creat (ref 0–29)
Microalbumin, Urine: 4 ug/mL

## 2021-11-18 LAB — COMPREHENSIVE METABOLIC PANEL
ALT: 7 IU/L (ref 0–32)
AST: 13 IU/L (ref 0–40)
Albumin/Globulin Ratio: 1.6 (ref 1.2–2.2)
Albumin: 3.9 g/dL (ref 3.8–4.8)
Alkaline Phosphatase: 34 IU/L — ABNORMAL LOW (ref 44–121)
BUN/Creatinine Ratio: 17 (ref 12–28)
BUN: 23 mg/dL (ref 8–27)
Bilirubin Total: 0.5 mg/dL (ref 0.0–1.2)
CO2: 23 mmol/L (ref 20–29)
Calcium: 9.6 mg/dL (ref 8.7–10.3)
Chloride: 99 mmol/L (ref 96–106)
Creatinine, Ser: 1.33 mg/dL — ABNORMAL HIGH (ref 0.57–1.00)
Globulin, Total: 2.5 g/dL (ref 1.5–4.5)
Glucose: 132 mg/dL — ABNORMAL HIGH (ref 70–99)
Potassium: 4.3 mmol/L (ref 3.5–5.2)
Sodium: 138 mmol/L (ref 134–144)
Total Protein: 6.4 g/dL (ref 6.0–8.5)
eGFR: 42 mL/min/{1.73_m2} — ABNORMAL LOW (ref >59)

## 2021-11-24 ENCOUNTER — Inpatient Hospital Stay: Payer: Medicare Other | Attending: Oncology | Admitting: Oncology

## 2021-11-24 ENCOUNTER — Encounter: Payer: Self-pay | Admitting: Oncology

## 2021-11-24 VITALS — BP 153/65 | HR 51 | Temp 97.0°F | Resp 18

## 2021-11-24 DIAGNOSIS — Z923 Personal history of irradiation: Secondary | ICD-10-CM | POA: Diagnosis not present

## 2021-11-24 DIAGNOSIS — Z79899 Other long term (current) drug therapy: Secondary | ICD-10-CM | POA: Insufficient documentation

## 2021-11-24 DIAGNOSIS — I10 Essential (primary) hypertension: Secondary | ICD-10-CM | POA: Diagnosis not present

## 2021-11-24 DIAGNOSIS — D0511 Intraductal carcinoma in situ of right breast: Secondary | ICD-10-CM | POA: Diagnosis not present

## 2021-11-24 DIAGNOSIS — Z86000 Personal history of in-situ neoplasm of breast: Secondary | ICD-10-CM | POA: Insufficient documentation

## 2021-11-24 NOTE — Progress Notes (Signed)
Patient denies any concerns today.  

## 2021-11-24 NOTE — Progress Notes (Signed)
[facility]  [phone] [phone]  ID: Carol Ramsey OB: 01-Nov-1945  MR#: [id number]  XJO#:[id number]  Patient Care Team: Jerrol Banana., MD as PCP - General (Family Medicine) Marry Guan, Laurice Record, MD as Consulting Physician (Orthopedic Surgery) Dasher, Rayvon Char, MD as Consulting Physician (Dermatology) Corey Skains, MD as Consulting Physician (Cardiology) Thelma Comp, [place] as Consulting Physician (Optometry) Byrnett, Forest Gleason, MD (General Surgery) Noreene Filbert, MD as Referring Physician (Radiation Oncology) Lloyd Huger, MD as Consulting Physician (Oncology) Germaine Pomfret, [facility] (Pharmacist)   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  She discontinued tamoxifen several months ago.  She currently feels well and is asymptomatic.  She has no neurologic complaints. She denies any recent fevers or illnesses. She has a good appetite and denies weight loss.  She denies any chest pain, shortness of breath, cough, or hemoptysis.  She denies any nausea, vomiting, constipation, or diarrhea. She has no urinary complaints.  Patient offers no specific complaints today.  REVIEW OF SYSTEMS:   Review of Systems  Constitutional: Negative.  Negative for fever, malaise/fatigue and weight loss.  Respiratory: Negative.  Negative for cough and shortness of breath.   Cardiovascular: Negative.  Negative for chest pain and leg swelling.  Gastrointestinal: Negative.  Negative for abdominal pain.  Genitourinary: Negative.  Negative for dysuria.  Musculoskeletal: Negative.  Negative for back pain.  Skin: Negative.  Negative for rash.  Neurological: Negative.  Negative for sensory change, focal weakness and weakness.  Endo/Heme/Allergies:  Negative for environmental allergies.  Psychiatric/Behavioral: Negative.  The patient is not nervous/anxious.     As  per HPI. Otherwise, a complete review of systems is negative.  PAST MEDICAL HISTORY: Past Medical History:  Diagnosis Date   Anemia    Arthritis    [place]   right breast DC[place] grade 3   [place]   Diabetes mellitus without complication ([name])    Hyperlipidemia    Hypertension    Hyperthyroidism    Obesity    Personal history of radiation therapy    Right lumpectomy 2018   Thyroid disease    hyperthyroidism   Vitamin D deficiency     PAST SURGICAL HISTORY: Past Surgical History:  Procedure Laterality Date   BREAST BIOPSY Right 06/28/2016   stereotactic biopsy - DUCTAL CARCINOMA IN SITU (DCIS), HIGH NUCLEAR GRADE  (very narrow margins)   BREAST LUMPECTOMY Right 07/18/2016   DCIS   JOINT REPLACEMENT Left    tkr   KNEE SURGERY Left    arthroscopy   MASTECTOMY, PARTIAL Right 07/18/2016   Procedure: MASTECTOMY PARTIAL;  Surgeon: JRobert Bellow MD;  Location: [facility] ORS;  Service: General;  Laterality: Right;   SKIN CANCER EXCISION     face    FAMILY HISTORY: Family History  Problem Relation Age of Onset   Hypertension Mother    Cervical cancer Mother    Heart disease Father    Hypertension Father    Lung cancer Father    Lung cancer Sister    Brain cancer Sister    Diabetes Brother    Heart disease Brother    Hypertension Brother    Stroke Brother    Breast cancer Neg Hx     ADVANCED DIRECTIVES (Y/N):  N  HEALTH MAINTENANCE: Social History   Tobacco Use   Smoking status: Never  Smokeless tobacco: Never  Vaping Use   Vaping Use: Never used  Substance Use Topics   Alcohol use: No   Drug use: No     Colonoscopy:  PAP:  Bone density:  Lipid panel:  Allergies  Allergen Reactions   Gabapentin Swelling   Quinine Nausea Only and Nausea And Vomiting   Amoxicillin Itching and Rash    Has patient had a PCN reaction causing immediate rash, facial/tongue/throat swelling, SOB or lightheadedness with  hypotension: No Has patient had a PCN reaction causing severe rash involving mucus membranes or skin necrosis: No Has patient had a PCN reaction that required hospitalization No Has patient had a PCN reaction occurring within the last 10 years: No If all of the above answers are "NO", then may proceed with Cephalosporin use.    Dilaudid  [Hydromorphone Hcl] Rash   Etodolac Rash   Hydrochlorothiazide Rash   Hydrocodone Rash   Hydromorphone Rash   Sulfa Antibiotics Rash    Current Outpatient Medications  Medication Sig Dispense Refill   acetaminophen (TYLENOL) 500 MG tablet Take 500 mg by mouth every 6 (six) hours as needed for mild pain or moderate pain.     ascorbic acid (VITAMIN C) 500 MG tablet Take 500 mg by mouth daily.     atorvastatin (LIPITOR) 10 MG tablet Take 10 mg by mouth daily at 6 PM.      Cholecalciferol (VITAMIN D) 125 MCG (5000 UT) CAPS Take 5,000 Units by mouth daily.     Cyanocobalamin (VITAMIN B-12) 5000 MCG SUBL Place 5,000 mcg under the tongue daily.     dapagliflozin propanediol (FARXIGA) 10 MG TABS tablet Take 1 tablet (10 mg total) by mouth daily. Patient receives through [name] Patient [place] 90 tablet 3   furosemide (LASIX) 20 MG tablet Take 1 tablet (20 mg total) by mouth daily as needed for edema. 30 tablet 3   glipiZIDE (GLUCOTROL XL) 10 MG 24 hr tablet Take 1 tablet (10 mg total) by mouth daily with breakfast. TAKE 1 TABLET BY MOUTH DAILY 90 tablet 3   glucose blood (ONETOUCH VERIO) test strip TEST BLOOD SUGAR TWICE DAILY AS INSTRUCTED 100 strip 11   lisinopril-hydrochlorothiazide (ZESTORETIC) 20-12.5 MG tablet TAKE 1 TABLET BY MOUTH EVERY MORNING 90 tablet 0   metoprolol succinate (TOPROL-XL) 100 MG 24 hr tablet TAKE 1 TABLET BY MOUTH DAILY WITH OR IMMEDIATELY FOLLOWING A MEAL 90 tablet 1   prednisoLONE acetate (PRED FORTE) 1 % ophthalmic suspension      sitaGLIPtin-metformin ([name]) 50-1000 MG tablet Take 1 tablet by mouth 2 (two) times daily with a  meal. Patient receives through [name] Patient Assistance through Mar 2023. 180 tablet 0   triamcinolone cream (KENALOG) 0.1 %      Magnesium 500 MG CAPS Take 1 capsule (500 mg total) by mouth at bedtime. 30 capsule 5   No current facility-administered medications for this visit.    OBJECTIVE: Vitals:   11/24/21 1046  BP: (!) 153/65  Pulse: (!) 51  Resp: 18  Temp: (!) 97 F (36.1 C)     There is no height or weight on file to calculate BMI.    ECOG FS:0 - Asymptomatic  General: Well-developed, well-nourished, no acute distress.  Sitting in wheelchair. Eyes: Pink conjunctiva, anicteric sclera. HEENT: Normocephalic, moist mucous membranes. Breast: Exam deferred today. Lungs: No audible wheezing or coughing. Heart: Regular rate and rhythm. Abdomen: Soft, nontender, no obvious distention. Musculoskeletal: No edema, cyanosis, or clubbing. Neuro: Alert, answering all questions appropriately.  Cranial nerves grossly intact. Skin: No rashes or petechiae noted. Psych: Normal affect.    LAB RESULTS:  Lab Results  Component Value Date   NA 138 11/17/2021   K 4.3 11/17/2021   CL 99 11/17/2021   CO2 23 11/17/2021   GLUCOSE 132 (H) 11/17/2021   BUN 23 11/17/2021   CREATININE 1.33 (H) 11/17/2021   CALCIUM 9.6 11/17/2021   PROT 6.4 11/17/2021   ALBUMIN 3.9 11/17/2021   AST 13 11/17/2021   ALT 7 11/17/2021   ALKPHOS 34 (L) 11/17/2021   BILITOT 0.5 11/17/2021   GFRNONAA 45 (L) 02/19/2020   GFRAA 52 (L) 02/19/2020    Lab Results  Component Value Date   WBC 8.2 06/07/2021   NEUTROABS 5.2 06/07/2021   HGB 11.1 06/07/2021   HCT 34.9 06/07/2021   MCV 88 06/07/2021   PLT 223 06/07/2021     STUDIES: No results found.  ASSESSMENT: Right breast high grade DCIS.  PLAN:    1. Right breast high grade DCIS: Final pathology was reviewed independently as also discussed at breast case conference. Patient had close margins less than 0.5 mm and was given the option for reexcision to  obtain better margins or proceed directly with XRT. She declined reexcision and subsequently underwent adjuvant XRT which she completed in May 2018.  Patient completed 5 years of tamoxifen in May 2023.  Her most recent mammogram on June 30, 2021 was reported as BI-RADS 1.  No further intervention is needed.  No further follow-up is necessary.  Patient will require yearly screening mammograms which can now be ordered by her primary care physician.  Please refer patient back if there are any questions or concerns.   2.  Hypertension: Chronic and unchanged.  Continue follow-up and treatment per primary care.  I spent a total of 20 minutes reviewing chart data, face-to-face evaluation with the patient, counseling and coordination of care as detailed above.    Patient expressed understanding and was in agreement with this plan. She also understands that She can call clinic at any time with any questions, concerns, or complaints.    Cancer Staging  Ductal carcinoma in situ (DCIS) of right breast Staging form: Breast, AJCC 8th Edition - Pathologic stage from 08/02/2016: Stage 0 (pTis (DCIS), pN0, cM0, ER+, PR+, HER2-) - Signed by Lloyd Huger, MD on 08/02/2016 Neoadjuvant therapy: No Nuclear grade: G3 Laterality: Right   Lloyd Huger, MD   11/26/2021 7:02 AM

## 2021-11-29 DIAGNOSIS — I10 Essential (primary) hypertension: Secondary | ICD-10-CM | POA: Diagnosis not present

## 2021-11-29 DIAGNOSIS — D2261 Melanocytic nevi of right upper limb, including shoulder: Secondary | ICD-10-CM | POA: Diagnosis not present

## 2021-11-29 DIAGNOSIS — E1122 Type 2 diabetes mellitus with diabetic chronic kidney disease: Secondary | ICD-10-CM

## 2021-11-29 DIAGNOSIS — N1831 Chronic kidney disease, stage 3a: Secondary | ICD-10-CM | POA: Diagnosis not present

## 2021-11-29 DIAGNOSIS — D2271 Melanocytic nevi of right lower limb, including hip: Secondary | ICD-10-CM | POA: Diagnosis not present

## 2021-11-29 DIAGNOSIS — D485 Neoplasm of uncertain behavior of skin: Secondary | ICD-10-CM | POA: Diagnosis not present

## 2021-11-29 DIAGNOSIS — X32XXXA Exposure to sunlight, initial encounter: Secondary | ICD-10-CM | POA: Diagnosis not present

## 2021-11-29 DIAGNOSIS — Z85828 Personal history of other malignant neoplasm of skin: Secondary | ICD-10-CM | POA: Diagnosis not present

## 2021-11-29 DIAGNOSIS — D044 Carcinoma in situ of skin of scalp and neck: Secondary | ICD-10-CM | POA: Diagnosis not present

## 2021-11-29 DIAGNOSIS — L570 Actinic keratosis: Secondary | ICD-10-CM | POA: Diagnosis not present

## 2021-11-29 DIAGNOSIS — D2262 Melanocytic nevi of left upper limb, including shoulder: Secondary | ICD-10-CM | POA: Diagnosis not present

## 2021-12-01 DIAGNOSIS — M9903 Segmental and somatic dysfunction of lumbar region: Secondary | ICD-10-CM | POA: Diagnosis not present

## 2021-12-01 DIAGNOSIS — M9905 Segmental and somatic dysfunction of pelvic region: Secondary | ICD-10-CM | POA: Diagnosis not present

## 2021-12-01 DIAGNOSIS — M5416 Radiculopathy, lumbar region: Secondary | ICD-10-CM | POA: Diagnosis not present

## 2021-12-01 DIAGNOSIS — M7918 Myalgia, other site: Secondary | ICD-10-CM | POA: Diagnosis not present

## 2021-12-01 DIAGNOSIS — M5136 Other intervertebral disc degeneration, lumbar region: Secondary | ICD-10-CM | POA: Diagnosis not present

## 2021-12-01 DIAGNOSIS — M5451 Vertebrogenic low back pain: Secondary | ICD-10-CM | POA: Diagnosis not present

## 2021-12-01 DIAGNOSIS — M25552 Pain in left hip: Secondary | ICD-10-CM | POA: Diagnosis not present

## 2021-12-17 ENCOUNTER — Other Ambulatory Visit: Payer: Self-pay | Admitting: Family Medicine

## 2021-12-17 ENCOUNTER — Ambulatory Visit: Payer: Medicare Other

## 2021-12-17 DIAGNOSIS — I10 Essential (primary) hypertension: Secondary | ICD-10-CM

## 2021-12-17 DIAGNOSIS — N1831 Chronic kidney disease, stage 3a: Secondary | ICD-10-CM

## 2021-12-17 MED ORDER — JANUMET 50-1000 MG PO TABS: 0.5000 | ORAL_TABLET | Freq: Two times a day (BID) | ORAL | 0 refills | Status: DC

## 2021-12-17 NOTE — Telephone Encounter (Signed)
Requested Prescriptions  Pending Prescriptions Disp Refills  . lisinopril-hydrochlorothiazide (ZESTORETIC) 20-12.5 MG tablet [Pharmacy Med Name: LISINOPRIL-HCTZ 20/12.5MG TABLETS] 90 tablet 0    Sig: TAKE 1 TABLET BY MOUTH EVERY MORNING     Cardiovascular:  ACEI + Diuretic Combos Failed - 12/17/2021  5:34 PM      Failed - Cr in normal range and within 180 days    Creat  Date Value Ref Range Status  01/26/2017 1.16 (H) 0.60 - 0.93 mg/dL Final    Comment:    For patients >66 years of age, the reference limit for Creatinine is approximately 13% higher for people identified as African-American. .    Creatinine, Ser  Date Value Ref Range Status  11/17/2021 1.33 (H) 0.57 - 1.00 mg/dL Final         Failed - Last BP in normal range    BP Readings from Last 1 Encounters:  11/24/21 (!) 153/65         Passed - Na in normal range and within 180 days    Sodium  Date Value Ref Range Status  11/17/2021 138 134 - 144 mmol/L Final  07/28/2011 140 136 - 145 mmol/L Final         Passed - K in normal range and within 180 days    Potassium  Date Value Ref Range Status  11/17/2021 4.3 3.5 - 5.2 mmol/L Final  07/28/2011 4.2 3.5 - 5.1 mmol/L Final         Passed - eGFR is 30 or above and within 180 days    EGFR (African American)  Date Value Ref Range Status  08/31/2011 57 (L)  Final   GFR calc Af Amer  Date Value Ref Range Status  02/19/2020 52 (L) >59 mL/min/1.73 Final    Comment:    **In accordance with recommendations from the NKF-ASN Task force,**   [facility] is in the process of updating its eGFR calculation to the   2021 CKD-EPI creatinine equation that estimates kidney function   without a race variable.    EGFR (Non-African Amer.)  Date Value Ref Range Status  08/31/2011 49 (L)  Final    Comment:    eGFR values <57m/min/1.73 m2 may be an indication of chronic kidney disease (CKD). Calculated eGFR is useful in patients with stable renal function. The eGFR calculation  will not be reliable in acutely ill patients when serum creatinine is changing rapidly. It is not useful in  patients on dialysis. The eGFR calculation may not be applicable to patients at the low and high extremes of body sizes, pregnant women, and vegetarians.    GFR calc non Af Amer  Date Value Ref Range Status  02/19/2020 45 (L) >59 mL/min/1.73 Final   eGFR  Date Value Ref Range Status  11/17/2021 42 (L) >59 mL/min/1.73 Final         Passed - Patient is not pregnant      Passed - Valid encounter within last 6 months    Recent Outpatient Visits          2 months ago Type 2 diabetes mellitus with stage 3a chronic kidney disease, without long-term current use of insulin ([facility]   6 months ago Type 2 diabetes mellitus with stage 3a chronic kidney disease, without long-term current use of insulin [facility]   10 months ago Type 2 diabetes mellitus with stage 3a  chronic kidney disease, without long-term current use of insulin [facility])   [facility] Jerrol Banana., MD   1 year ago Type 2 diabetes mellitus with stage 3a chronic kidney disease, without long-term current use of insulin [facility])   [facility] Jerrol Banana., MD   1 year ago Type 2 diabetes mellitus without complication, without long-term current use of insulin [facility])   [facility] Jerrol Banana., MD      Future Appointments            In 3 weeks Jerrol Banana., MD [facility]

## 2021-12-17 NOTE — Patient Instructions (Signed)
Visit Information It was great speaking with you today!  Please let me know if you have any questions about our visit.   Goals Addressed             This Visit's Progress    Monitor and Manage My Blood Sugar-Diabetes Type 2   On track    Timeframe:  Long-Range Goal Priority:  High Start Date:  08/03/2020                           Expected End Date: 02/03/2023                      Follow Up within 30 days   - check blood sugar twice daily (before breakfast and after supper) - check blood sugar if I feel it is too high or too low - enter blood sugar readings and medication or insulin into daily log - take the blood sugar log to all doctor visits    Why is this important?   Checking your blood sugar at home helps to keep it from getting very high or very low.  Writing the results in a diary or log helps the doctor know how to care for you.  Your blood sugar log should have the time, date and the results.  Also, write down the amount of insulin or other medicine that you take.  Other information, like what you ate, exercise done and how you were feeling, will also be helpful.     Notes:        Patient Care Plan: General Pharmacy (Adult)     Problem Identified: Hypertension, Hyperlipidemia, Diabetes, Chronic Kidney Disease, Hypothyroidism and Chronic Pain and History of Breast Cancer   Priority: High     Long-Range Goal: Patient-Specific Goal   Start Date: 08/03/2020  Expected End Date: 09/15/2022  Recent Progress: On track  Priority: High  Note:   Current Barriers:  Unable to independently afford treatment regimen Unable to achieve control of diabetes   Pharmacist Clinical Goal(s):  Patient will verbalize ability to afford treatment regimen achieve control of diabetes as evidenced by A1c less than 8% through collaboration with PharmD and provider.   Interventions: 1:1 collaboration with Jerrol Banana., MD regarding development and update of comprehensive plan of  care as evidenced by provider attestation and co-signature Inter-disciplinary care team collaboration (see longitudinal plan of care) Comprehensive medication review performed; medication list updated in electronic medical record  Hypertension (BP goal <140/90) -Controlled -Current treatment: Furosemide 20 mg daily: Appropriate, Effective, Safe, Accessible  Lisinopril-HCTZ 20-12.5 mg daily: Appropriate, Effective, Safe, Accessible  Metoprolol XL 100 mg daily: Appropriate, Effective, Safe, Accessible  -Medications previously tried: NA  -Current home readings:   108/55, pulse 60   97/54, pulse 52   107/55, pulse 55  -Denies hypotensive/hypertensive symptoms -Continue current medications  Hyperlipidemia: (LDL goal < 70) -Controlled: Not addressed during this visit. -Current treatment: Atorvastatin 10 mg daily  -Medications previously tried: NA  -Current dietary patterns: Trying to eat more vegetables, glucerna shakes, eating less overall.  -Current exercise habits: Physical Therapy exercises 3 days  -Educated on Importance of limiting foods high in cholesterol; Exercise goal of 150 minutes per week; -Recommended to continue current medication  Diabetes (A1c goal <8%) -Uncontrolled, but improving.  -Current medications: Farxiga 10 mg daily: Appropriate, Effective, Safe, Accessible Glipizide XL 10 mg daily: Appropriate, Effective, Safe, Accessible   [name] 50-1000 mg twice daily:  Appropriate, Effective, Query Safe -Medications previously tried: Ozempic (  -Current home glucose readings  Fasting PM    113     125 160   120 185   107 167   123 170   117 156   115 164     168  Average 117 167   -Denies hypoglycemic/hyperglycemic symptoms  -Current meal patterns: Cut down on cheerios.  breakfast: Typically skips  lunch: Smaller portions dinner: Glucerna OR soup + crackers + apple   snacks: Rarely snacks, sometimes will have sugar-free ice pop.    drinks: Water mostly,  3-4 glasses daily.  --Current exercise patterns: Band exercises 3x weekly, 15 minutes each. Walking in home on off days.  -DECREASE [name] to 1/2 tablet twice daily based on real function  Patient Goals/Self-Care Activities Patient will:  - check glucose daily before breakfast, document, and provide at future appointments check blood pressure weekly, document, and provide at future appointments target a minimum of 150 minutes of moderate intensity exercise weekly  Follow Up Plan: Telephone follow up appointment with care management team member scheduled for:  03/18/2022 at 11:00 AM    Patient agreed to services and verbal consent obtained.   Patient verbalizes understanding of instructions and care plan provided today and agrees to view in [place]. Active MyChart status and patient understanding of how to access instructions and care plan via MyChart confirmed with patient.     [name], PharmD, Para March, CPP  Clinical Pharmacist Practitioner  [facility] [phone]

## 2021-12-17 NOTE — Progress Notes (Signed)
Chronic Care Management Pharmacy Note  12/17/2021 Name:  Carol Ramsey MRN:  [id number] DOB:  12-07-1945  Summary: Patient presents for CCM follow-up.    Recommendations/Changes made from today's visit: -DECREASE [name] to 1/2 tablet twice daily based on real function  Plan: CPP follow-up 3 months   Subjective: Carol Ramsey is an 76 y.o. year old female who is a primary patient of Jerrol Banana., MD.  The CCM team was consulted for assistance with disease management and care coordination needs.    Engaged with patient by telephone for follow up visit in response to provider referral for pharmacy case management and/or care coordination services.   Consent to Services:  The patient was given information about Chronic Care Management services, agreed to services, and gave verbal consent prior to initiation of services.  Please see initial visit note for detailed documentation.   Patient Care Team: Jerrol Banana., MD as PCP - General (Family Medicine) Marry Guan, Laurice Record, MD as Consulting Physician (Orthopedic Surgery) Dasher, Rayvon Char, MD as Consulting Physician (Dermatology) Corey Skains, MD as Consulting Physician (Cardiology) Thelma Comp, [place] as Consulting Physician (Optometry) Bary Castilla, Forest Gleason, MD (General Surgery) Noreene Filbert, MD as Referring Physician (Radiation Oncology) Lloyd Huger, MD as Consulting Physician (Oncology) Germaine Pomfret, [facility] (Pharmacist)  Recent office visits: 10/06/21: Patient presented to Dr. Rosanna Randy for follow-up. A1c 8.3%. Glipizide 10 mg daily. BP 150/69.  06/07/21: Patient presented to Dr. Rosanna Randy for follow-up.A1c worsened to 9.9%  Recent consult visits: 06/28/21: Patient presented to Jettie Booze, [employer] (Cardiology) for follow-up.   Hospital visits: None in previous 6 months  Objective:  Lab Results  Component Value Date   CREATININE 1.33 (H) 11/17/2021   BUN 23 11/17/2021   GFRNONAA 45 (L) 02/19/2020    GFRAA 52 (L) 02/19/2020   NA 138 11/17/2021   K 4.3 11/17/2021   CALCIUM 9.6 11/17/2021   CO2 23 11/17/2021   GLUCOSE 132 (H) 11/17/2021    Lab Results  Component Value Date/Time   HGBA1C 8.3 (A) 10/06/2021 02:06 PM   HGBA1C 9.9 (H) 06/07/2021 11:04 AM   HGBA1C 7.8 (A) 02/12/2021 11:34 AM   HGBA1C 7.6 (H) 02/19/2020 10:56 AM    Last diabetic Eye exam:  Lab Results  Component Value Date/Time   HMDIABEYEEXA Retinopathy (A) 12/31/2020 12:00 AM    Last diabetic Foot exam: No results found for: "HMDIABFOOTEX"   Lab Results  Component Value Date   CHOL 106 06/07/2021   HDL 48 06/07/2021   LDLCALC 35 06/07/2021   TRIG 129 06/07/2021   CHOLHDL 2.2 06/07/2021       Latest Ref Rng & Units 11/17/2021   11:00 AM 06/07/2021   11:04 AM 02/19/2020   10:56 AM  Hepatic Function  Total Protein 6.0 - 8.5 g/dL 6.4  5.9  6.6   Albumin 3.8 - 4.8 g/dL 3.9  3.6  3.9   AST 0 - 40 IU/L _0 ALT 0 - 32 IU/L _1 Alk Phosphatase 44 - 121 IU/L 34  31  53   Total Bilirubin 0.0 - 1.2 mg/dL 0.5  0.4  0.5     Lab Results  Component Value Date/Time   TSH 3.910 06/07/2021 11:04 AM   TSH 4.800 (H) 02/19/2020 10:56 AM       Latest Ref Rng & Units 06/07/2021   11:04 AM 02/19/2020   10:56 AM 05/30/2019  10:13 AM  CBC  WBC 3.4 - 10.8 x10E3/uL 8.2  9.2  7.7   Hemoglobin 11.1 - 15.9 g/dL 11.1  11.7  10.8   Hematocrit 34.0 - 46.6 % 34.9  35.5  33.8   Platelets 150 - 450 x10E3/uL 223  200  194     No results found for: "VD25OH"  Clinical ASCVD: No  The ASCVD Risk score (Arnett DK, et al., 2019) failed to calculate for the following reasons:   The valid total cholesterol range is 130 to 320 mg/dL       06/07/2021   10:23 AM 06/01/2020   11:17 AM 04/02/2020   11:22 AM  Depression screen PHQ 2/9  Decreased Interest 0 0 0  Down, Depressed, Hopeless 0 0 0  PHQ - 2 Score 0 0 0    Social History   Tobacco Use  Smoking Status Never  Smokeless Tobacco Never   BP Readings from  Last 3 Encounters:  11/24/21 (!) 153/65  10/06/21 (!) 150/69  06/07/21 120/64   Pulse Readings from Last 3 Encounters:  11/24/21 (!) 51  10/06/21 (!) 55  06/07/21 65   Wt Readings from Last 3 Encounters:  10/06/21 188 lb (85.3 kg)  06/07/21 189 lb 4.8 oz (85.9 kg)  05/11/21 191 lb (86.6 kg)   BMI Readings from Last 3 Encounters:  10/06/21 33.30 kg/m  06/07/21 33.53 kg/m  05/11/21 33.83 kg/m    Assessment/Interventions: Review of patient past medical history, allergies, medications, health status, including review of consultants reports, laboratory and other test data, was performed as part of comprehensive evaluation and provision of chronic care management services.   SDOH:  (Social Determinants of Health) assessments and interventions performed: Yes      SDOH Screenings   Alcohol Screen: Low Risk  (06/07/2021)   Alcohol Screen    Last Alcohol Screening Score (AUDIT): 0  Depression (PHQ2-9): Low Risk  (06/07/2021)   Depression (PHQ2-9)    PHQ-2 Score: 0  Financial Resource Strain: High Risk (07/09/2021)   Overall Financial Resource Strain (CARDIA)    Difficulty of Paying Living Expenses: Hard  Food Insecurity: No Food Insecurity (06/07/2021)   Hunger Vital Sign    Worried About Running Out of Food in the Last Year: Never true    Ran Out of Food in the Last Year: Never true  Housing: Low Risk  (06/07/2021)   Housing    Last Housing Risk Score: 0  Physical Activity: Insufficiently Active (06/07/2021)   Exercise Vital Sign    Days of Exercise per Week: 2 days    Minutes of Exercise per Session: 20 min  Social Connections: Socially Isolated (06/07/2021)   Social Connection and Isolation Panel [NHANES]    Frequency of Communication with Friends and Family: Never    Frequency of Social Gatherings with Friends and Family: Once a week    Attends Religious Services: Never    Marine scientist or Organizations: No    Attends Archivist Meetings: Never    Marital  Status: Married  Stress: No Stress Concern Present (06/07/2021)   [employer] [place] of Stress : Not at all  Tobacco Use: Low Risk  (11/24/2021)   Patient History    Smoking Tobacco Use: Never    Smokeless Tobacco Use: Never    Passive Exposure: Not on file  Transportation Needs: No Transportation Needs (06/07/2021)   PRAPARE - Transportation    Lack  of Transportation (Medical): No    Lack of Transportation (Non-Medical): No    CCM Care Plan  Allergies  Allergen Reactions   Gabapentin Swelling   Quinine Nausea Only and Nausea And Vomiting   Amoxicillin Itching and Rash    Has patient had a PCN reaction causing immediate rash, facial/tongue/throat swelling, SOB or lightheadedness with hypotension: No Has patient had a PCN reaction causing severe rash involving mucus membranes or skin necrosis: No Has patient had a PCN reaction that required hospitalization No Has patient had a PCN reaction occurring within the last 10 years: No If all of the above answers are "NO", then may proceed with Cephalosporin use.    Dilaudid  [Hydromorphone Hcl] Rash   Etodolac Rash   Hydrochlorothiazide Rash   Hydrocodone Rash   Hydromorphone Rash   Sulfa Antibiotics Rash    Medications Reviewed Today     Reviewed by Johney Maine, RN (Registered Nurse) on 11/24/21 at 66  Med List Status: <None>   Medication Order Taking? Sig Documenting Provider Last Dose Status Informant  acetaminophen (TYLENOL) 500 MG tablet [id number]  Take 500 mg by mouth every 6 (six) hours as needed for mild pain or moderate pain. [provider]  Active Self  ascorbic acid (VITAMIN C) 500 MG tablet [id number]  Take 500 mg by mouth daily. [provider]  Active   atorvastatin (LIPITOR) 10 MG tablet [id number]  Take 10 mg by mouth daily at 6 PM.  [provider]  Active Self           Med Note Michaelle Birks, ALEXANDRE A   Tue Apr 28, 2020   1:09 PM)    Cholecalciferol (VITAMIN D) 125 MCG (5000 UT) CAPS [id number]  Take 5,000 Units by mouth daily. [provider]  Active Self           Med Note Michaelle Birks, Cathe Mons A   Tue Apr 28, 2020  1:10 PM)    Cyanocobalamin (VITAMIN B-12) 5000 MCG SUBL [id number]  Place 5,000 mcg under the tongue daily. [provider]  Active   dapagliflozin propanediol (FARXIGA) 10 MG TABS tablet [id number]  Take 1 tablet (10 mg total) by mouth daily. Patient receives through [name] Patient Assistance Jerrol Banana., MD  Active   fluticasone [facility]) 50 MCG/ACT nasal spray [id number]  Place 2 sprays into both nostrils daily. Fenton Malling M, PA-C  Active   furosemide (LASIX) 20 MG tablet [id number]  Take 1 tablet (20 mg total) by mouth daily as needed for edema. Jerrol Banana., MD  Active   glipiZIDE (GLUCOTROL XL) 10 MG 24 hr tablet [id number]  Take 1 tablet (10 mg total) by mouth daily with breakfast. TAKE 1 TABLET BY MOUTH DAILY Jerrol Banana., MD  Active   glucose blood [facility] VERIO) test strip [id number]  TEST BLOOD SUGAR TWICE DAILY AS INSTRUCTED Jerrol Banana., MD  Active   lisinopril-hydrochlorothiazide (ZESTORETIC) 20-12.5 MG tablet [id number]  TAKE 1 TABLET BY MOUTH EVERY MORNING Jerrol Banana., MD  Active   Magnesium 500 MG CAPS [id number]  Take 1 capsule (500 mg total) by mouth at bedtime. Milinda Pointer, MD  Expired 12/28/20 2359   metoprolol succinate (TOPROL-XL) 100 MG 24 hr tablet [id number]  TAKE 1 TABLET BY MOUTH DAILY WITH OR IMMEDIATELY FOLLOWING A MEAL Jerrol Banana., MD  Active   prednisoLONE acetate (PRED FORTE) 1 % ophthalmic suspension [id number]   [provider]  Active  sitaGLIPtin-metformin ([name]) 50-1000 MG tablet [id number]  Take 1 tablet by mouth 2 (two) times daily with a meal. Patient receives through [name] Patient Assistance through Mar 2023. Jerrol Banana., MD  Active   tamoxifen (NOLVADEX) 20  MG tablet [id number]  TAKE 1 TABLET(20 MG) BY MOUTH DAILY Grayland Ormond Kathlene November, MD  Active   triamcinolone cream (KENALOG) 0.1 % [id number]   [provider]  Active   Med List Note Hart Rochester, RN 02/01/18 1118): UDS 02-01-2018   New patient            Patient Active Problem List   Diagnosis Date Noted   Moderate tricuspid regurgitation 05/25/2020   OA (osteoarthritis) of knee 10/30/2018   Lymphedema 10/30/2018   DDD (degenerative disc disease), lumbar 02/28/2018   Osteoarthritis of sacroiliac joint (Left) 02/28/2018   Somatic dysfunction of sacroiliac joint (Left) 02/28/2018   Sacroiliac joint dysfunction (Left) 02/28/2018   Abnormal MRI, lumbar spine (07/22/2013) 02/28/2018   Lumbar facet syndrome (Bilateral) 02/28/2018   Lumbar spondylosis 02/28/2018   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) 02/28/2018   Lumbar facet arthropathy (Bilateral) 02/28/2018   Lumbar intervertebral disc protrusion 02/28/2018   Lumbar nerve root compression 02/28/2018   Hypoalbuminemia 02/27/2018   Hypomagnesemia 02/27/2018   Vitamin B 12 deficiency 02/27/2018   Low serum vitamin B12 02/06/2018   Chronic low back pain (Primary Area of Pain) (Bilateral) w/ sciatica (Left) 02/01/2018   Chronic lower extremity pain (Secondary Area of Pain) (Left) 02/01/2018   Chronic pain syndrome 02/01/2018   Pharmacologic therapy 02/01/2018   Disorder of skeletal system 02/01/2018   Problems influencing health status 02/01/2018   Chronic sacroiliac joint pain (Left) 02/01/2018   Bilateral leg edema 02/21/2017   CKD (chronic kidney disease) stage 3, GFR 30-59 ml/min ([name]) 07/21/2016   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   LVH (left ventricular hypertrophy) due to hypertensive disease, without heart failure 01/19/2016   Pulmonary hypertension ([place]) 02/10/2015   Acquired hypothyroidism 09/17/2014   Adaptation reaction 09/17/2014   Absolute anemia 09/17/2014   Cardiac  dysrhythmia 09/17/2014   Lumbar facet hypertrophy 09/17/2014   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 09/17/2014   Type 2 diabetes mellitus with stage 3a chronic kidney disease, without long-term current use of insulin ([place]) 09/17/2014   Essential (primary) hypertension 09/17/2014   HLD (hyperlipidemia) 09/17/2014   Extreme obesity 09/17/2014   Vitamin D insufficiency 09/17/2014   Beat, premature ventricular 12/11/2013   OBESITY 11/14/2007   Achilles bursitis or tendinitis 11/14/2007   Calcaneal bursitis (heel), unspecified laterality 06/26/2007    Immunization History  Administered Date(s) Administered   Fluad Quad(high Dose 65+) 01/29/2019, 01/30/2020   Influenza Split 02/25/2006, 03/07/2011, 02/27/2012   Influenza, High Dose Seasonal PF 03/03/2016, 01/26/2017, 01/08/2018   Influenza,inj,Quad PF,6+ Mos 03/04/2013   PFIZER(Purple Top)SARS-COV-2 Vaccination 06/11/2019, 07/02/2019, 01/31/2020   Pneumococcal Conjugate-13 04/13/2015   Pneumococcal Polysaccharide-23 02/17/2007, 02/27/2012   Td 10/21/2003   Tdap 05/11/2016   Zoster, Live 11/08/2010    Conditions to be addressed/monitored:  Hypertension, Hyperlipidemia, Diabetes, Chronic Kidney Disease, Hypothyroidism and Chronic Pain and History of Breast Cancer  Care Plan : General Pharmacy (Adult)  Updates made by Germaine Pomfret, RPH since 12/17/2021 12:00 AM     Problem: Hypertension, Hyperlipidemia, Diabetes, Chronic Kidney Disease, Hypothyroidism and Chronic Pain and History of Breast Cancer   Priority: High     Long-Range Goal: Patient-Specific Goal   Start Date: 08/03/2020  Expected End Date:  09/15/2022  Recent Progress: On track  Priority: High  Note:   Current Barriers:  Unable to independently afford treatment regimen Unable to achieve control of diabetes   Pharmacist Clinical Goal(s):  Patient will verbalize ability to afford treatment regimen achieve control of diabetes as evidenced by A1c less than 8%  through collaboration with PharmD and provider.   Interventions: 1:1 collaboration with Jerrol Banana., MD regarding development and update of comprehensive plan of care as evidenced by provider attestation and co-signature Inter-disciplinary care team collaboration (see longitudinal plan of care) Comprehensive medication review performed; medication list updated in electronic medical record  Hypertension (BP goal <140/90) -Controlled -Current treatment: Furosemide 20 mg daily: Appropriate, Effective, Safe, Accessible  Lisinopril-HCTZ 20-12.5 mg daily: Appropriate, Effective, Safe, Accessible  Metoprolol XL 100 mg daily: Appropriate, Effective, Safe, Accessible  -Medications previously tried: NA  -Current home readings:   108/55, pulse 60   97/54, pulse 52   107/55, pulse 55  -Denies hypotensive/hypertensive symptoms -Continue current medications  Hyperlipidemia: (LDL goal < 70) -Controlled: Not addressed during this visit. -Current treatment: Atorvastatin 10 mg daily  -Medications previously tried: NA  -Current dietary patterns: Trying to eat more vegetables, glucerna shakes, eating less overall.  -Current exercise habits: Physical Therapy exercises 3 days  -Educated on Importance of limiting foods high in cholesterol; Exercise goal of 150 minutes per week; -Recommended to continue current medication  Diabetes (A1c goal <8%) -Uncontrolled, but improving.  -Current medications: Farxiga 10 mg daily: Appropriate, Effective, Safe, Accessible Glipizide XL 10 mg daily: Appropriate, Effective, Safe, Accessible   [name] 50-1000 mg twice daily: Appropriate, Effective, Query Safe -Medications previously tried: Ozempic (  -Current home glucose readings  Fasting PM    113     125 160   120 185   107 167   123 170   117 156   115 164     168  Average 117 167   -Denies hypoglycemic/hyperglycemic symptoms  -Current meal patterns: Cut down on cheerios.  breakfast:  Typically skips  lunch: Smaller portions dinner: Glucerna OR soup + crackers + apple   snacks: Rarely snacks, sometimes will have sugar-free ice pop.    drinks: Water mostly, 3-4 glasses daily.  --Current exercise patterns: Band exercises 3x weekly, 15 minutes each. Walking in home on off days.  -DECREASE [name] to 1/2 tablet twice daily based on real function  Patient Goals/Self-Care Activities Patient will:  - check glucose daily before breakfast, document, and provide at future appointments check blood pressure weekly, document, and provide at future appointments target a minimum of 150 minutes of moderate intensity exercise weekly  Follow Up Plan: Telephone follow up appointment with care management team member scheduled for:  03/18/2022 at 11:00 AM      Medication Assistance: Application for [name]  medication assistance program. in process.  Anticipated assistance start date TBD.  See plan of care for additional detail. [name] obtained through [name] medication assistance program.  Renewal prescription sent in for 2023.   Patient's preferred pharmacy is:  [id number] [address], [address] [facility] [address] [place] [address] Phone: [phone] Fax: [phone]   Uses pill box? Yes Pt endorses 80% compliance  We discussed: Current pharmacy is preferred with insurance plan and patient is satisfied with pharmacy services Patient decided to: Continue current medication management strategy  Care Plan and Follow Up Patient Decision:  Patient agrees  to Care Plan and Follow-up.  Plan: Telephone follow up appointment with care management team member scheduled for:  03/18/2022 at 11:00 AM  [name], PharmD, Para [place], [place] [phone]

## 2021-12-19 ENCOUNTER — Other Ambulatory Visit: Payer: Self-pay | Admitting: Family Medicine

## 2021-12-19 DIAGNOSIS — I10 Essential (primary) hypertension: Secondary | ICD-10-CM

## 2021-12-24 ENCOUNTER — Other Ambulatory Visit: Payer: Self-pay | Admitting: Family Medicine

## 2021-12-24 DIAGNOSIS — N1831 Chronic kidney disease, stage 3a: Secondary | ICD-10-CM

## 2021-12-24 MED ORDER — DAPAGLIFLOZIN PROPANEDIOL 10 MG PO TABS: 10.0000 mg | ORAL_TABLET | Freq: Every day | ORAL | 1 refills | Status: DC

## 2021-12-24 NOTE — Telephone Encounter (Signed)
Medication Refill - Medication: dapagliflozin propanediol (FARXIGA) 10 MG TABS tablet   Has the patient contacted their pharmacy? Yes.   (Agent: If no, request that the patient contact the pharmacy for the refill. If patient does not wish to contact the pharmacy document the reason why and proceed with request.) (Agent: If yes, when and what did the pharmacy advise?)  Preferred Pharmacy (with phone number or street name):  [facility] [address], [address] [place]  [place] [address]  Phone: [phone] Fax: [phone]   Has the patient been seen for an appointment in the last year OR does the patient have an upcoming appointment? Yes.    Agent: Please be advised that RX refills may take up to 3 business days. We ask that you follow-up with your pharmacy.

## 2021-12-24 NOTE — Telephone Encounter (Signed)
Requested Prescriptions  Pending Prescriptions Disp Refills  . dapagliflozin propanediol (FARXIGA) 10 MG TABS tablet 90 tablet 3    Sig: Take 1 tablet (10 mg total) by mouth daily. Patient receives through [name] Patient Assistance     Endocrinology:  Diabetes - SGLT2 Inhibitors Failed - 12/24/2021  3:27 PM      Failed - Cr in normal range and within 360 days    Creat  Date Value Ref Range Status  01/26/2017 1.16 (H) 0.60 - 0.93 mg/dL Final    Comment:    For patients >29 years of age, the reference limit for Creatinine is approximately 13% higher for people identified as African-American. .    Creatinine, Ser  Date Value Ref Range Status  11/17/2021 1.33 (H) 0.57 - 1.00 mg/dL Final         Failed - HBA1C is between 0 and 7.9 and within 180 days    Hemoglobin A1C  Date Value Ref Range Status  10/06/2021 8.3 (A) 4.0 - 5.6 % Final   Hgb A1c MFr Bld  Date Value Ref Range Status  06/07/2021 9.9 (H) 4.8 - 5.6 % Final    Comment:             Prediabetes: 5.7 - 6.4          Diabetes: >6.4          Glycemic control for adults with diabetes: <7.0          Failed - eGFR in normal range and within 360 days    EGFR (African American)  Date Value Ref Range Status  08/31/2011 57 (L)  Final   GFR calc Af Amer  Date Value Ref Range Status  02/19/2020 52 (L) >59 mL/min/1.73 Final    Comment:    **In accordance with recommendations from the NKF-ASN Task force,**   [facility] is in the process of updating its eGFR calculation to the   2021 CKD-EPI creatinine equation that estimates kidney function   without a race variable.    EGFR (Non-African Amer.)  Date Value Ref Range Status  08/31/2011 49 (L)  Final    Comment:    eGFR values <70m/min/1.73 m2 may be an indication of chronic kidney disease (CKD). Calculated eGFR is useful in patients with stable renal function. The eGFR calculation will not be reliable in acutely ill patients when serum creatinine is changing rapidly. It is  not useful in  patients on dialysis. The eGFR calculation may not be applicable to patients at the low and high extremes of body sizes, pregnant women, and vegetarians.    GFR calc non Af Amer  Date Value Ref Range Status  02/19/2020 45 (L) >59 mL/min/1.73 Final   eGFR  Date Value Ref Range Status  11/17/2021 42 (L) >59 mL/min/1.73 Final         Passed - Valid encounter within last 6 months    Recent Outpatient Visits          2 months ago Type 2 diabetes mellitus with stage 3a chronic kidney disease, without long-term current use of insulin ([facility]   6 months ago Type 2 diabetes mellitus with stage 3a chronic kidney disease, without long-term current use of insulin ([facility]   10 months ago Type 2 diabetes mellitus with stage 3a chronic kidney disease, without long-term current use of insulin ([facility]  Jerrol Banana., MD   1 year ago Type 2 diabetes mellitus with stage 3a chronic kidney disease, without long-term current use of insulin [facility])   [facility] Jerrol Banana., MD   1 year ago Type 2 diabetes mellitus without complication, without long-term current use of insulin [facility])   [facility] Jerrol Banana., MD      Future Appointments            In 2 weeks Jerrol Banana., MD [facility]

## 2021-12-27 ENCOUNTER — Other Ambulatory Visit: Payer: Self-pay | Admitting: Family Medicine

## 2021-12-27 DIAGNOSIS — N1831 Chronic kidney disease, stage 3a: Secondary | ICD-10-CM

## 2021-12-27 NOTE — Telephone Encounter (Signed)
Patient want the refill for dapagliflozin propanediol (FARXIGA) 10 MG TABS tablet  to be sent to the [facility] pharmacy instead of [facility].  [place], [address] [place] [address]  Phone:  [phone]  Fax:  [phone]

## 2021-12-28 MED ORDER — DAPAGLIFLOZIN PROPANEDIOL 10 MG PO TABS: 10.0000 mg | ORAL_TABLET | Freq: Every day | ORAL | 1 refills | Status: DC

## 2021-12-28 NOTE — Telephone Encounter (Signed)
Pt requested change pharmacy to mail order  Requested Prescriptions  Pending Prescriptions Disp Refills  . dapagliflozin propanediol (FARXIGA) 10 MG TABS tablet 90 tablet 1    Sig: Take 1 tablet (10 mg total) by mouth daily. Patient receives through [name] Patient Assistance     Endocrinology:  Diabetes - SGLT2 Inhibitors Failed - 12/27/2021  2:15 PM      Failed - Cr in normal range and within 360 days    Creat  Date Value Ref Range Status  01/26/2017 1.16 (H) 0.60 - 0.93 mg/dL Final    Comment:    For patients >23 years of age, the reference limit for Creatinine is approximately 13% higher for people identified as African-American. .    Creatinine, Ser  Date Value Ref Range Status  11/17/2021 1.33 (H) 0.57 - 1.00 mg/dL Final         Failed - HBA1C is between 0 and 7.9 and within 180 days    Hemoglobin A1C  Date Value Ref Range Status  10/06/2021 8.3 (A) 4.0 - 5.6 % Final   Hgb A1c MFr Bld  Date Value Ref Range Status  06/07/2021 9.9 (H) 4.8 - 5.6 % Final    Comment:             Prediabetes: 5.7 - 6.4          Diabetes: >6.4          Glycemic control for adults with diabetes: <7.0          Failed - eGFR in normal range and within 360 days    EGFR (African American)  Date Value Ref Range Status  08/31/2011 57 (L)  Final   GFR calc Af Amer  Date Value Ref Range Status  02/19/2020 52 (L) >59 mL/min/1.73 Final    Comment:    **In accordance with recommendations from the NKF-ASN Task force,**   [facility] is in the process of updating its eGFR calculation to the   2021 CKD-EPI creatinine equation that estimates kidney function   without a race variable.    EGFR (Non-African Amer.)  Date Value Ref Range Status  08/31/2011 49 (L)  Final    Comment:    eGFR values <36m/min/1.73 m2 may be an indication of chronic kidney disease (CKD). Calculated eGFR is useful in patients with stable renal function. The eGFR calculation will not be reliable in acutely ill  patients when serum creatinine is changing rapidly. It is not useful in  patients on dialysis. The eGFR calculation may not be applicable to patients at the low and high extremes of body sizes, pregnant women, and vegetarians.    GFR calc non Af Amer  Date Value Ref Range Status  02/19/2020 45 (L) >59 mL/min/1.73 Final   eGFR  Date Value Ref Range Status  11/17/2021 42 (L) >59 mL/min/1.73 Final         Passed - Valid encounter within last 6 months    Recent Outpatient Visits          2 months ago Type 2 diabetes mellitus with stage 3a chronic kidney disease, without long-term current use of insulin [facility]   6 months ago Type 2 diabetes mellitus with stage 3a chronic kidney disease, without long-term current use of insulin [facility]   10 months ago Type 2 diabetes mellitus with stage 3a chronic kidney disease, without long-term current use  of insulin [facility])   [facility] Jerrol Banana., MD   1 year ago Type 2 diabetes mellitus with stage 3a chronic kidney disease, without long-term current use of insulin [facility]   [facility] Jerrol Banana., MD   1 year ago Type 2 diabetes mellitus without complication, without long-term current use of insulin [facility])   [facility] Jerrol Banana., MD      Future Appointments            In 2 weeks Jerrol Banana., MD [facility]

## 2021-12-29 DIAGNOSIS — I119 Hypertensive heart disease without heart failure: Secondary | ICD-10-CM | POA: Diagnosis not present

## 2021-12-29 DIAGNOSIS — E782 Mixed hyperlipidemia: Secondary | ICD-10-CM | POA: Diagnosis not present

## 2021-12-29 DIAGNOSIS — N1831 Chronic kidney disease, stage 3a: Secondary | ICD-10-CM | POA: Diagnosis not present

## 2021-12-29 DIAGNOSIS — I10 Essential (primary) hypertension: Secondary | ICD-10-CM | POA: Diagnosis not present

## 2021-12-29 DIAGNOSIS — I272 Pulmonary hypertension, unspecified: Secondary | ICD-10-CM | POA: Diagnosis not present

## 2021-12-29 DIAGNOSIS — R600 Localized edema: Secondary | ICD-10-CM | POA: Diagnosis not present

## 2021-12-29 DIAGNOSIS — I071 Rheumatic tricuspid insufficiency: Secondary | ICD-10-CM | POA: Diagnosis not present

## 2021-12-29 DIAGNOSIS — E1122 Type 2 diabetes mellitus with diabetic chronic kidney disease: Secondary | ICD-10-CM | POA: Diagnosis not present

## 2022-01-05 DIAGNOSIS — M9905 Segmental and somatic dysfunction of pelvic region: Secondary | ICD-10-CM | POA: Diagnosis not present

## 2022-01-05 DIAGNOSIS — M5136 Other intervertebral disc degeneration, lumbar region: Secondary | ICD-10-CM | POA: Diagnosis not present

## 2022-01-05 DIAGNOSIS — M25552 Pain in left hip: Secondary | ICD-10-CM | POA: Diagnosis not present

## 2022-01-05 DIAGNOSIS — M5451 Vertebrogenic low back pain: Secondary | ICD-10-CM | POA: Diagnosis not present

## 2022-01-05 DIAGNOSIS — M7918 Myalgia, other site: Secondary | ICD-10-CM | POA: Diagnosis not present

## 2022-01-05 DIAGNOSIS — M9903 Segmental and somatic dysfunction of lumbar region: Secondary | ICD-10-CM | POA: Diagnosis not present

## 2022-01-05 DIAGNOSIS — M5416 Radiculopathy, lumbar region: Secondary | ICD-10-CM | POA: Diagnosis not present

## 2022-01-11 ENCOUNTER — Ambulatory Visit (INDEPENDENT_AMBULATORY_CARE_PROVIDER_SITE_OTHER): Payer: Medicare Other | Admitting: Family Medicine

## 2022-01-11 ENCOUNTER — Encounter: Payer: Self-pay | Admitting: Family Medicine

## 2022-01-11 VITALS — BP 130/50 | HR 51 | Resp 16 | Wt 188.0 lb

## 2022-01-11 DIAGNOSIS — Z23 Encounter for immunization: Secondary | ICD-10-CM | POA: Diagnosis not present

## 2022-01-11 DIAGNOSIS — G8929 Other chronic pain: Secondary | ICD-10-CM | POA: Diagnosis not present

## 2022-01-11 DIAGNOSIS — D0511 Intraductal carcinoma in situ of right breast: Secondary | ICD-10-CM

## 2022-01-11 DIAGNOSIS — E039 Hypothyroidism, unspecified: Secondary | ICD-10-CM | POA: Diagnosis not present

## 2022-01-11 DIAGNOSIS — E6609 Other obesity due to excess calories: Secondary | ICD-10-CM | POA: Diagnosis not present

## 2022-01-11 DIAGNOSIS — I10 Essential (primary) hypertension: Secondary | ICD-10-CM

## 2022-01-11 DIAGNOSIS — R600 Localized edema: Secondary | ICD-10-CM | POA: Diagnosis not present

## 2022-01-11 DIAGNOSIS — E7800 Pure hypercholesterolemia, unspecified: Secondary | ICD-10-CM

## 2022-01-11 DIAGNOSIS — M1712 Unilateral primary osteoarthritis, left knee: Secondary | ICD-10-CM | POA: Diagnosis not present

## 2022-01-11 DIAGNOSIS — N1831 Chronic kidney disease, stage 3a: Secondary | ICD-10-CM

## 2022-01-11 DIAGNOSIS — Z6833 Body mass index (BMI) 33.0-33.9, adult: Secondary | ICD-10-CM

## 2022-01-11 DIAGNOSIS — M5442 Lumbago with sciatica, left side: Secondary | ICD-10-CM | POA: Diagnosis not present

## 2022-01-11 DIAGNOSIS — E1122 Type 2 diabetes mellitus with diabetic chronic kidney disease: Secondary | ICD-10-CM | POA: Diagnosis not present

## 2022-01-11 LAB — HM DIABETES EYE EXAM

## 2022-01-11 LAB — POCT GLYCOSYLATED HEMOGLOBIN (HGB A1C)
Est. average glucose Bld gHb Est-mCnc: 183
Hemoglobin A1C: 8 % — AB (ref 4.0–5.6)

## 2022-01-11 NOTE — Progress Notes (Signed)
Established patient visit  I,[name],acting as a scribe for [name], MD.,have documented all relevant documentation on the behalf of [name], MD,as directed by  [name], MD while in the presence of [name], MD.   Patient: Carol Ramsey   DOB: [birth year]   76 y.o. Female  MRN: [id number] Visit Date: 01/11/2022  Today's healthcare provider: [name], MD   Chief Complaint  Patient presents with   Follow-up   Diabetes   Hypertension   Subjective    HPI  Comes in today for follow-up. She is very hopeful Dr. Marry Guan will do a total knee replacement for her soon. 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  Lost 20 pounds in the past year.  Diabetes Mellitus Type II, follow-up  Lab Results  Component Value Date   HGBA1C 8.0 (A) 01/11/2022   HGBA1C 8.3 (A) 10/06/2021   HGBA1C 9.9 (H) 06/07/2021   Last seen for diabetes 3 months ago.  Management since then includes continuing the same treatment.  Home blood sugar records: fasting range: 95-128 Most Recent Eye Exam: sent fax requesting most recent eye exam.  --------------------------------------------------------------------------------------------------- Hypertension, follow-up  BP Readings from Last 3 Encounters:  01/11/22 (!) 130/50  11/24/21 (!) 153/65  10/06/21 (!) 150/69   Wt Readings from Last 3 Encounters:  01/11/22 188 lb (85.3 kg)  10/06/21 188 lb (85.3 kg)  06/07/21 189 lb 4.8 oz (85.9 kg)     She was last seen for hypertension 3 months ago.  Management since that visit includes; Home blood pressure readings.  All of 017/51 but certainly would like to see it less than .   Outside blood pressures are 95/45.  ---------------------------------------------------------------------------------------------------   Medications: Outpatient Medications Prior to Visit   Medication Sig   acetaminophen (TYLENOL) 500 MG tablet Take 500 mg by mouth every 6 (six) hours as needed for mild pain or moderate pain.   ascorbic acid (VITAMIN C) 500 MG tablet Take 500 mg by mouth daily.   atorvastatin (LIPITOR) 10 MG tablet Take 10 mg by mouth daily at 6 PM.    Cholecalciferol (VITAMIN D) 125 MCG (5000 UT) CAPS Take 5,000 Units by mouth daily.   Cyanocobalamin (VITAMIN B-12) 5000 MCG SUBL Place 5,000 mcg under the tongue daily.   dapagliflozin propanediol (FARXIGA) 10 MG TABS tablet Take 1 tablet (10 mg total) by mouth daily. Patient receives through [name] Patient Assistance   furosemide (LASIX) 20 MG tablet Take 1 tablet (20 mg total) by mouth daily as needed for edema.   glipiZIDE (GLUCOTROL XL) 10 MG 24 hr tablet Take 1 tablet (10 mg total) by mouth daily with breakfast. TAKE 1 TABLET BY MOUTH DAILY   glucose blood (ONETOUCH VERIO) test strip TEST BLOOD SUGAR TWICE DAILY AS INSTRUCTED   lisinopril-hydrochlorothiazide (ZESTORETIC) 20-12.5 MG tablet TAKE 1 TABLET BY MOUTH EVERY MORNING   metoprolol succinate (TOPROL-XL) 100 MG 24 hr tablet TAKE 1 TABLET BY MOUTH DAILY WITH OR IMMEDIATELY FOLLOWING A MEAL   prednisoLONE acetate (PRED FORTE) 1 % ophthalmic suspension    sitaGLIPtin-metformin ([name]) 50-1000 MG tablet Take 0.5 tablets by mouth 2 (two) times daily with a meal. Patient receives through [name] Patient Assistance through Mar 2023.   triamcinolone cream (KENALOG) 0.1 %    Magnesium 500 MG CAPS Take 1 capsule (500 mg total) by mouth at bedtime.  No facility-administered medications prior to visit.    Review of Systems  Constitutional:  Negative for appetite change, chills, fatigue and fever.  Respiratory:  Negative for chest tightness and shortness of breath.   Cardiovascular:  Negative for chest pain and palpitations.  Gastrointestinal:  Negative for abdominal pain, nausea and vomiting.  Neurological:  Negative for dizziness and weakness.    Last  hemoglobin A1c Lab Results  Component Value Date   HGBA1C 8.0 (A) 01/11/2022       Objective    BP (!) 130/50 (BP Location: Left Arm, Patient Position: Sitting, Cuff Size: Normal)   Pulse (!) 51   Resp 16   Wt 188 lb (85.3 kg)   SpO2 100%   BMI 33.30 kg/m  BP Readings from Last 3 Encounters:  01/11/22 (!) 130/50  11/24/21 (!) 153/65  10/06/21 (!) 150/69   Wt Readings from Last 3 Encounters:  01/11/22 188 lb (85.3 kg)  10/06/21 188 lb (85.3 kg)  06/07/21 189 lb 4.8 oz (85.9 kg)      Physical Exam Vitals and nursing note reviewed.  Constitutional:      Appearance: She is obese.  HENT:     Right Ear: Tympanic membrane normal.     Left Ear: Tympanic membrane normal.     Nose: Nose normal.     Mouth/Throat:     Mouth: Mucous membranes are moist.     Pharynx: Oropharynx is clear.  Eyes:     Conjunctiva/sclera: Conjunctivae normal.  Cardiovascular:     Rate and Rhythm: Normal rate and regular rhythm.     Pulses: Normal pulses.     Heart sounds: Normal heart sounds.  Pulmonary:     Effort: Pulmonary effort is normal.     Breath sounds: Normal breath sounds.  Abdominal:     General: Bowel sounds are normal.     Palpations: Abdomen is soft.  Musculoskeletal:     Cervical back: Normal range of motion and neck supple.     Comments: Lymphedema only today.  Skin:    General: Skin is warm and dry.     Comments: Very fair skin.  Neurological:     General: No focal deficit present.     Mental Status: She is alert and oriented to person, place, and time.  Psychiatric:        Mood and Affect: Mood normal.        Behavior: Behavior normal.        Thought Content: Thought content normal.        Judgment: Judgment normal.       Results for orders placed or performed in visit on 01/11/22  POCT glycosylated hemoglobin (Hb A1C)  Result Value Ref Range   Hemoglobin A1C 8.0 (A) 4.0 - 5.6 %   Est. average glucose Bld gHb Est-mCnc 183     Assessment & Plan     1. Type  2 diabetes mellitus with stage 3a chronic kidney disease, without long-term current use of insulin ([name]) A1C is improved from 8.3-8.0.. Patient cleared from surgery from my perspective - POCT glycosylated hemoglobin (Hb A1C)  2. Need for influenza vaccination  - Flu Vaccine QUAD High Dose(Fluad)  3. Essential (primary) hypertension Blood pressure control  4. Acquired hypothyroidism Treat for euthyroid TSH  5. 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 Primary osteoarthritis of left knee Total knee replacement  7. Ductal carcinoma in situ (DCIS) of right breast  8. Pure hypercholesterolemia   9. Bilateral leg edema   10. Class 1 obesity due to excess calories with serious comorbidity and body mass index (BMI) of 33.0 to 33.9 in adult Improved a good bit.   No follow-ups on file.      I, [name], MD, have reviewed all documentation for this visit. The documentation on 01/14/22 for the exam, diagnosis, procedures, and orders are all accurate and complete.    Yousuf Ager Cranford Mon, MD  [facility] [phone] (phone) [phone] (fax)  [place]

## 2022-02-02 DIAGNOSIS — M5416 Radiculopathy, lumbar region: Secondary | ICD-10-CM | POA: Diagnosis not present

## 2022-02-02 DIAGNOSIS — M5451 Vertebrogenic low back pain: Secondary | ICD-10-CM | POA: Diagnosis not present

## 2022-02-02 DIAGNOSIS — M5136 Other intervertebral disc degeneration, lumbar region: Secondary | ICD-10-CM | POA: Diagnosis not present

## 2022-02-02 DIAGNOSIS — M9905 Segmental and somatic dysfunction of pelvic region: Secondary | ICD-10-CM | POA: Diagnosis not present

## 2022-02-02 DIAGNOSIS — M7918 Myalgia, other site: Secondary | ICD-10-CM | POA: Diagnosis not present

## 2022-02-02 DIAGNOSIS — M25552 Pain in left hip: Secondary | ICD-10-CM | POA: Diagnosis not present

## 2022-02-02 DIAGNOSIS — M9903 Segmental and somatic dysfunction of lumbar region: Secondary | ICD-10-CM | POA: Diagnosis not present

## 2022-02-04 ENCOUNTER — Telehealth: Payer: Self-pay

## 2022-02-04 NOTE — Progress Notes (Signed)
    Chronic Care Management Pharmacy Assistant   Name: Carol Ramsey  MRN: [id number] DOB: 1945/09/25  Reason for Encounter: 2024 Patient Assistance Renewal Application for [name]   Patient is currently receiving patient assistance for her [name] through [place] requires all Medicare patient's to renew their applications starting 46/50/3546.  Since this patient doesn't have Medicare Part D for prescription coverage her renewal will not need to be mailed until end of February.   Renewal Application started, and will be mailed to the patient's home address for her to complete. Once completed by the patient she will be instructed to return the application along with her proof of income to [place] [facility].  Application will be mailed out to patient Feb 2024 for [place].  Patient is also receiving Patient Assistance for her [name] through [place]. [name] as far as we know will automatically renew their Medicare Patient's and [facility] will just send over a renewal prescription to [name] for 2024.  Task sent to [name].  Medications: Outpatient Encounter Medications as of 02/04/2022  Medication Sig   acetaminophen (TYLENOL) 500 MG tablet Take 500 mg by mouth every 6 (six) hours as needed for mild pain or moderate pain.   ascorbic acid (VITAMIN C) 500 MG tablet Take 500 mg by mouth daily.   atorvastatin (LIPITOR) 10 MG tablet Take 10 mg by mouth daily at 6 PM.    Cholecalciferol (VITAMIN D) 125 MCG (5000 UT) CAPS Take 5,000 Units by mouth daily.   Cyanocobalamin (VITAMIN B-12) 5000 MCG SUBL Place 5,000 mcg under the tongue daily.   dapagliflozin propanediol (FARXIGA) 10 MG TABS tablet Take 1 tablet (10 mg total) by mouth daily. Patient receives through [name] Patient Assistance   furosemide (LASIX) 20 MG tablet Take 1 tablet (20 mg total) by mouth daily as needed for edema.   glipiZIDE (GLUCOTROL XL) 10 MG 24 hr tablet Take 1 tablet (10 mg total) by mouth  daily with breakfast. TAKE 1 TABLET BY MOUTH DAILY   glucose blood (ONETOUCH VERIO) test strip TEST BLOOD SUGAR TWICE DAILY AS INSTRUCTED   lisinopril-hydrochlorothiazide (ZESTORETIC) 20-12.5 MG tablet TAKE 1 TABLET BY MOUTH EVERY MORNING   Magnesium 500 MG CAPS Take 1 capsule (500 mg total) by mouth at bedtime.   metoprolol succinate (TOPROL-XL) 100 MG 24 hr tablet TAKE 1 TABLET BY MOUTH DAILY WITH OR IMMEDIATELY FOLLOWING A MEAL   prednisoLONE acetate (PRED FORTE) 1 % ophthalmic suspension    sitaGLIPtin-metformin ([name]) 50-1000 MG tablet Take 0.5 tablets by mouth 2 (two) times daily with a meal. Patient receives through [name] Patient Assistance through Mar 2023.   triamcinolone cream (KENALOG) 0.1 %    No facility-administered encounter medications on file as of 02/04/2022.    [name], CPA/CMA Clinical Pharmacist Assistant Phone: [phone]

## 2022-02-28 ENCOUNTER — Telehealth: Payer: Self-pay | Admitting: *Deleted

## 2022-02-28 ENCOUNTER — Telehealth: Payer: Self-pay | Admitting: Family Medicine

## 2022-02-28 NOTE — Telephone Encounter (Signed)
Patient is requesting to have her A1c checked so she can get a knee surgery. This has been ongoing for a while. Patient was last seen 01/11/2022 and A1c was 8.0. patient wants to know if she can have lab only? It is to soon for her to get it right now. Will need to be after 12/12. Please advise?

## 2022-02-28 NOTE — Telephone Encounter (Signed)
Patient needs to have a HgbA1c  so she can have her knee surgery. Can you order that for her?

## 2022-03-02 DIAGNOSIS — M25552 Pain in left hip: Secondary | ICD-10-CM | POA: Diagnosis not present

## 2022-03-02 DIAGNOSIS — M5416 Radiculopathy, lumbar region: Secondary | ICD-10-CM | POA: Diagnosis not present

## 2022-03-02 DIAGNOSIS — M5451 Vertebrogenic low back pain: Secondary | ICD-10-CM | POA: Diagnosis not present

## 2022-03-02 DIAGNOSIS — M7918 Myalgia, other site: Secondary | ICD-10-CM | POA: Diagnosis not present

## 2022-03-02 DIAGNOSIS — M9905 Segmental and somatic dysfunction of pelvic region: Secondary | ICD-10-CM | POA: Diagnosis not present

## 2022-03-02 DIAGNOSIS — M9903 Segmental and somatic dysfunction of lumbar region: Secondary | ICD-10-CM | POA: Diagnosis not present

## 2022-03-02 DIAGNOSIS — M5136 Other intervertebral disc degeneration, lumbar region: Secondary | ICD-10-CM | POA: Diagnosis not present

## 2022-03-17 ENCOUNTER — Telehealth: Payer: Self-pay

## 2022-03-17 NOTE — Telephone Encounter (Signed)
Copied from [place] [id number]. Topic: Appointment Scheduling - Scheduling Inquiry for Clinic >> Mar 17, 2022  1:41 PM Cyndi Bender wrote: Reason for CRM: Pt requests that the appt with [name] be cancelled. Pt stated her son is having emergency surgery so she will have to reschedule that appt

## 2022-03-18 ENCOUNTER — Telehealth: Payer: Medicare Other

## 2022-03-29 DIAGNOSIS — H5202 Hypermetropia, left eye: Secondary | ICD-10-CM | POA: Diagnosis not present

## 2022-03-29 DIAGNOSIS — E113293 Type 2 diabetes mellitus with mild nonproliferative diabetic retinopathy without macular edema, bilateral: Secondary | ICD-10-CM | POA: Diagnosis not present

## 2022-03-29 DIAGNOSIS — Z961 Presence of intraocular lens: Secondary | ICD-10-CM | POA: Diagnosis not present

## 2022-03-29 DIAGNOSIS — Z9841 Cataract extraction status, right eye: Secondary | ICD-10-CM | POA: Diagnosis not present

## 2022-03-29 DIAGNOSIS — Z9842 Cataract extraction status, left eye: Secondary | ICD-10-CM | POA: Diagnosis not present

## 2022-03-29 LAB — HM DIABETES EYE EXAM

## 2022-03-30 ENCOUNTER — Encounter: Payer: Self-pay | Admitting: Family Medicine

## 2022-03-30 DIAGNOSIS — M5416 Radiculopathy, lumbar region: Secondary | ICD-10-CM | POA: Diagnosis not present

## 2022-03-30 DIAGNOSIS — M5136 Other intervertebral disc degeneration, lumbar region: Secondary | ICD-10-CM | POA: Diagnosis not present

## 2022-03-30 DIAGNOSIS — M7918 Myalgia, other site: Secondary | ICD-10-CM | POA: Diagnosis not present

## 2022-03-30 DIAGNOSIS — M9905 Segmental and somatic dysfunction of pelvic region: Secondary | ICD-10-CM | POA: Diagnosis not present

## 2022-03-30 DIAGNOSIS — M25552 Pain in left hip: Secondary | ICD-10-CM | POA: Diagnosis not present

## 2022-03-30 DIAGNOSIS — M9903 Segmental and somatic dysfunction of lumbar region: Secondary | ICD-10-CM | POA: Diagnosis not present

## 2022-03-30 DIAGNOSIS — M5451 Vertebrogenic low back pain: Secondary | ICD-10-CM | POA: Diagnosis not present

## 2022-04-07 DIAGNOSIS — D044 Carcinoma in situ of skin of scalp and neck: Secondary | ICD-10-CM | POA: Diagnosis not present

## 2022-04-07 DIAGNOSIS — D485 Neoplasm of uncertain behavior of skin: Secondary | ICD-10-CM | POA: Diagnosis not present

## 2022-04-08 ENCOUNTER — Ambulatory Visit (INDEPENDENT_AMBULATORY_CARE_PROVIDER_SITE_OTHER): Payer: Medicare Other

## 2022-04-08 DIAGNOSIS — N1831 Chronic kidney disease, stage 3a: Secondary | ICD-10-CM

## 2022-04-08 DIAGNOSIS — I10 Essential (primary) hypertension: Secondary | ICD-10-CM

## 2022-04-08 NOTE — Progress Notes (Unsigned)
Chronic Care Management Pharmacy Note  04/08/2022 Name:  Carol Ramsey MRN:  [id number] DOB:  09/13/1945  Summary: Patient presents for CCM follow-up.    Recommendations/Changes made from today's visit:   Plan: CPP follow-up 3 months   Subjective: Carol Ramsey is an 76 y.o. year old female who is a primary patient of Jerrol Banana., MD.  The CCM team was consulted for assistance with disease management and care coordination needs.    Engaged with patient by telephone for follow up visit in response to provider referral for pharmacy case management and/or care coordination services.   Consent to Services:  The patient was given information about Chronic Care Management services, agreed to services, and gave verbal consent prior to initiation of services.  Please see initial visit note for detailed documentation.   Patient Care Team: Jerrol Banana., MD as PCP - General (Family Medicine) Marry Guan, Laurice Record, MD as Consulting Physician (Orthopedic Surgery) Dasher, Rayvon Char, MD as Consulting Physician (Dermatology) Corey Skains, MD as Consulting Physician (Cardiology) Thelma Comp, [place] as Consulting Physician (Optometry) Bary Castilla, Forest Gleason, MD (General Surgery) Noreene Filbert, MD as Referring Physician (Radiation Oncology) Lloyd Huger, MD as Consulting Physician (Oncology) Germaine Pomfret, [facility] (Pharmacist)  Recent office visits: 10/06/21: Patient presented to Dr. Rosanna Randy for follow-up. A1c 8.3%. Glipizide 10 mg daily. BP 150/69.  06/07/21: Patient presented to Dr. Rosanna Randy for follow-up.A1c worsened to 9.9%  Recent consult visits: 06/28/21: Patient presented to Jettie Booze, [place] (Cardiology) for follow-up.   Hospital visits: None in previous 6 months  Objective:  Lab Results  Component Value Date   CREATININE 1.33 (H) 11/17/2021   BUN 23 11/17/2021   GFRNONAA 45 (L) 02/19/2020   GFRAA 52 (L) 02/19/2020   NA 138 11/17/2021   K 4.3 11/17/2021    CALCIUM 9.6 11/17/2021   CO2 23 11/17/2021   GLUCOSE 132 (H) 11/17/2021    Lab Results  Component Value Date/Time   HGBA1C 8.0 (A) 01/11/2022 10:44 AM   HGBA1C 8.3 (A) 10/06/2021 02:06 PM   HGBA1C 9.9 (H) 06/07/2021 11:04 AM   HGBA1C 7.6 (H) 02/19/2020 10:56 AM    Last diabetic Eye exam:  Lab Results  Component Value Date/Time   HMDIABEYEEXA Retinopathy (A) 03/29/2022 12:00 AM    Last diabetic Foot exam: No results found for: "HMDIABFOOTEX"   Lab Results  Component Value Date   CHOL 106 06/07/2021   HDL 48 06/07/2021   LDLCALC 35 06/07/2021   TRIG 129 06/07/2021   CHOLHDL 2.2 06/07/2021       Latest Ref Rng & Units 11/17/2021   11:00 AM 06/07/2021   11:04 AM 02/19/2020   10:56 AM  Hepatic Function  Total Protein 6.0 - 8.5 g/dL 6.4  5.9  6.6   Albumin 3.8 - 4.8 g/dL 3.9  3.6  3.9   AST 0 - 40 IU/L _0 ALT 0 - 32 IU/L _1 Alk Phosphatase 44 - 121 IU/L 34  31  53   Total Bilirubin 0.0 - 1.2 mg/dL 0.5  0.4  0.5     Lab Results  Component Value Date/Time   TSH 3.910 06/07/2021 11:04 AM   TSH 4.800 (H) 02/19/2020 10:56 AM       Latest Ref Rng & Units 06/07/2021   11:04 AM 02/19/2020   10:56 AM 05/30/2019   10:13 AM  CBC  WBC 3.4 - 10.8  x10E3/uL 8.2  9.2  7.7   Hemoglobin 11.1 - 15.9 g/dL 11.1  11.7  10.8   Hematocrit 34.0 - 46.6 % 34.9  35.5  33.8   Platelets 150 - 450 x10E3/uL 223  200  194     No results found for: "VD25OH"  Clinical ASCVD: No  The ASCVD Risk score (Arnett DK, et al., 2019) failed to calculate for the following reasons:   The valid total cholesterol range is 130 to 320 mg/dL       06/07/2021   10:23 AM 06/01/2020   11:17 AM 04/02/2020   11:22 AM  Depression screen PHQ 2/9  Decreased Interest 0 0 0  Down, Depressed, Hopeless 0 0 0  PHQ - 2 Score 0 0 0    Social History   Tobacco Use  Smoking Status Never  Smokeless Tobacco Never   BP Readings from Last 3 Encounters:  01/11/22 (!) 130/50  11/24/21 (!) 153/65   10/06/21 (!) 150/69   Pulse Readings from Last 3 Encounters:  01/11/22 (!) 51  11/24/21 (!) 51  10/06/21 (!) 55   Wt Readings from Last 3 Encounters:  01/11/22 188 lb (85.3 kg)  10/06/21 188 lb (85.3 kg)  06/07/21 189 lb 4.8 oz (85.9 kg)   BMI Readings from Last 3 Encounters:  01/11/22 33.30 kg/m  10/06/21 33.30 kg/m  06/07/21 33.53 kg/m    Assessment/Interventions: Review of patient past medical history, allergies, medications, health status, including review of consultants reports, laboratory and other test data, was performed as part of comprehensive evaluation and provision of chronic care management services.   SDOH:  (Social Determinants of Health) assessments and interventions performed: Yes SDOH Interventions    Flowsheet Row Chronic Care Management from 07/09/2021 in [place] from 06/07/2021 in [place] Management from 04/12/2021 in [place] Management from 03/05/2021 in [place] Management from 08/03/2020 in [place] from 06/01/2020 in [place] Interventions -- Intervention Not Indicated -- -- -- --  Housing Interventions -- Intervention Not Indicated -- -- -- --  Transportation Interventions -- Intervention Not Indicated -- -- Intervention Not Indicated --  Financial Strain Interventions Other (Comment)  [PAP] Intervention Not Indicated Other (Comment)  [PAP] Other (Comment) Other (Comment)  [PAP] --  Physical Activity Interventions -- Intervention Not Indicated -- -- -- Patient Refused  Stress Interventions -- Intervention Not Indicated -- -- -- --  Social Connections Interventions -- Intervention Not Indicated -- -- -- --          SDOH Screenings   Food Insecurity: No Food Insecurity (06/07/2021)  Housing: Low Risk  (06/07/2021)  Transportation  Needs: No Transportation Needs (06/07/2021)  Alcohol Screen: Low Risk  (06/07/2021)  Depression (PHQ2-9): Low Risk  (06/07/2021)  Financial Resource Strain: High Risk (07/09/2021)  Physical Activity: Insufficiently Active (06/07/2021)  Social Connections: Socially Isolated (06/07/2021)  Stress: No Stress Concern Present (06/07/2021)  Tobacco Use: Low Risk  (01/11/2022)    CCM Care Plan  Allergies  Allergen Reactions   Gabapentin Swelling   Quinine Nausea Only and Nausea And Vomiting   Amoxicillin Itching and Rash    Has patient had a PCN reaction causing immediate rash, facial/tongue/throat swelling, SOB or lightheadedness with hypotension: No Has patient had a PCN reaction causing severe rash involving mucus membranes or skin necrosis: No Has patient had a  PCN reaction that required hospitalization No Has patient had a PCN reaction occurring within the last 10 years: No If all of the above answers are "NO", then may proceed with Cephalosporin use.    Dilaudid  [Hydromorphone Hcl] Rash   Etodolac Rash   Hydrochlorothiazide Rash   Hydrocodone Rash   Hydromorphone Rash   Sulfa Antibiotics Rash    Medications Reviewed Today     Reviewed by Julieta Bellini, CMA (Certified Medical Assistant) on 01/11/22 at 1039  Med List Status: <None>   Medication Order Taking? Sig Documenting Provider Last Dose Status Informant  acetaminophen (TYLENOL) 500 MG tablet [id number] Yes Take 500 mg by mouth every 6 (six) hours as needed for mild pain or moderate pain. [provider] Taking Active Self  ascorbic acid (VITAMIN C) 500 MG tablet [id number] Yes Take 500 mg by mouth daily. [provider] Taking Active   atorvastatin (LIPITOR) 10 MG tablet [id number] Yes Take 10 mg by mouth daily at 6 PM.  [provider] Taking Active Self           Med Note Michaelle Birks, Sheralyn Pinegar A   Tue Apr 28, 2020  1:09 PM)    Cholecalciferol (VITAMIN D) 125 MCG (5000 UT) CAPS [id number] Yes Take 5,000 Units by  mouth daily. [provider] Taking Active Self           Med Note Michaelle Birks, Cathe Mons A   Tue Apr 28, 2020  1:10 PM)    Cyanocobalamin (VITAMIN B-12) 5000 MCG SUBL [id number] Yes Place 5,000 mcg under the tongue daily. [provider] Taking Active   dapagliflozin propanediol (FARXIGA) 10 MG TABS tablet [id number] Yes Take 1 tablet (10 mg total) by mouth daily. Patient receives through [name] Patient Assistance Jerrol Banana., MD Taking Active   furosemide (LASIX) 20 MG tablet [id number] Yes Take 1 tablet (20 mg total) by mouth daily as needed for edema. Jerrol Banana., MD Taking Active   glipiZIDE (GLUCOTROL XL) 10 MG 24 hr tablet [id number] Yes Take 1 tablet (10 mg total) by mouth daily with breakfast. TAKE 1 TABLET BY MOUTH DAILY Jerrol Banana., MD Taking Active   glucose blood [facility] VERIO) test strip [id number] Yes TEST BLOOD SUGAR TWICE DAILY AS INSTRUCTED Jerrol Banana., MD Taking Active   lisinopril-hydrochlorothiazide (ZESTORETIC) 20-12.5 MG tablet [id number] Yes TAKE 1 TABLET BY MOUTH EVERY MORNING Jerrol Banana., MD Taking Active   Magnesium 500 MG CAPS [id number]  Take 1 capsule (500 mg total) by mouth at bedtime. Milinda Pointer, MD  Expired 12/28/20 2359   metoprolol succinate (TOPROL-XL) 100 MG 24 hr tablet [id number] Yes TAKE 1 TABLET BY MOUTH DAILY WITH OR IMMEDIATELY FOLLOWING A MEAL Jerrol Banana., MD Taking Active   prednisoLONE acetate (PRED FORTE) 1 % ophthalmic suspension [id number] Yes  [provider] Taking Active   sitaGLIPtin-metformin ([name]) 50-1000 MG tablet [id number] Yes Take 0.5 tablets by mouth 2 (two) times daily with a meal. Patient receives through [name] Patient Assistance through Mar 2023. Jerrol Banana., MD Taking Active   triamcinolone cream (KENALOG) 0.1 % [id number] Yes  [provider] Taking Active   Med List Note Hart Rochester, RN 02/01/18 1118): UDS 02-01-2018    New patient            Patient Active Problem List   Diagnosis Date Noted   Moderate tricuspid regurgitation 05/25/2020   OA (osteoarthritis) of knee 10/30/2018  Lymphedema 10/30/2018   DDD (degenerative disc disease), lumbar 02/28/2018   Osteoarthritis of sacroiliac joint (Left) 02/28/2018   Somatic dysfunction of sacroiliac joint (Left) 02/28/2018   Sacroiliac joint dysfunction (Left) 02/28/2018   Abnormal MRI, lumbar spine (07/22/2013) 02/28/2018   Lumbar facet syndrome (Bilateral) 02/28/2018   Lumbar spondylosis 02/28/2018   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) 02/28/2018   Lumbar facet arthropathy (Bilateral) 02/28/2018   Lumbar intervertebral disc protrusion 02/28/2018   Lumbar nerve root compression 02/28/2018   Hypoalbuminemia 02/27/2018   Hypomagnesemia 02/27/2018   Vitamin B 12 deficiency 02/27/2018   Low serum vitamin B12 02/06/2018   Chronic low back pain (Primary Area of Pain) (Bilateral) w/ sciatica (Left) 02/01/2018   Chronic lower extremity pain (Secondary Area of Pain) (Left) 02/01/2018   Chronic pain syndrome 02/01/2018   Pharmacologic therapy 02/01/2018   Disorder of skeletal system 02/01/2018   Problems influencing health status 02/01/2018   Chronic sacroiliac joint pain (Left) 02/01/2018   Bilateral leg edema 02/21/2017   CKD (chronic kidney disease) stage 3, GFR 30-59 ml/min ([name]) 07/21/2016   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   LVH (left ventricular hypertrophy) due to hypertensive disease, without heart failure 01/19/2016   Pulmonary hypertension ([place]) 02/10/2015   Acquired hypothyroidism 09/17/2014   Adaptation reaction 09/17/2014   Absolute anemia 09/17/2014   Cardiac dysrhythmia 09/17/2014   Lumbar facet hypertrophy 09/17/2014   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 09/17/2014   Type 2 diabetes mellitus with stage 3a chronic kidney disease, without long-term current use of insulin ([place]) 09/17/2014   Essential  (primary) hypertension 09/17/2014   HLD (hyperlipidemia) 09/17/2014   Extreme obesity 09/17/2014   Vitamin D insufficiency 09/17/2014   Beat, premature ventricular 12/11/2013   OBESITY 11/14/2007   Achilles bursitis or tendinitis 11/14/2007   Calcaneal bursitis (heel), unspecified laterality 06/26/2007    Immunization History  Administered Date(s) Administered   Fluad Quad(high Dose 65+) 01/29/2019, 01/30/2020, 01/11/2022   Influenza Split 02/25/2006, 03/07/2011, 02/27/2012   Influenza, High Dose Seasonal PF 03/03/2016, 01/26/2017, 01/08/2018   Influenza,inj,Quad PF,6+ Mos 03/04/2013   PFIZER(Purple Top)SARS-COV-2 Vaccination 06/11/2019, 07/02/2019, 01/31/2020   Pneumococcal Conjugate-13 04/13/2015   Pneumococcal Polysaccharide-23 02/17/2007, 02/27/2012   Td 10/21/2003   Tdap 05/11/2016   Zoster, Live 11/08/2010    Conditions to be addressed/monitored:  Hypertension, Hyperlipidemia, Diabetes, Chronic Kidney Disease, Hypothyroidism and Chronic Pain and History of Breast Cancer  There are no care plans that you recently modified to display for this patient.     Medication Assistance: Application for [name]  medication assistance program. in process.  Anticipated assistance start date TBD.  See plan of care for additional detail. [name] obtained through [name] medication assistance program.  Renewal prescription sent in for 2023.   Patient's preferred pharmacy is:  [id number] [address], [address] AT [facility] OF [address] [place] [address] Phone: [phone] Fax: [phone]   Uses pill box? Yes Pt endorses 80% compliance  We discussed: Current pharmacy is preferred with insurance plan and patient is satisfied with pharmacy services Patient decided to: Continue current medication management strategy  Care Plan and Follow Up Patient Decision:  Patient agrees to Care Plan and Follow-up.  Plan: Telephone follow up  appointment with care management team member scheduled for:  03/18/2022 at 11:00 AM  [name], PharmD, Para March, [place] Pharmacist Practitioner  [facility] [phone]  Current  Barriers:  Unable to independently afford treatment regimen Unable to achieve control of diabetes   Pharmacist Clinical Goal(s):  Patient will verbalize ability to afford treatment regimen achieve control of diabetes as evidenced by A1c less than 8% through collaboration with PharmD and provider.   Interventions: 1:1 collaboration with Jerrol Banana., MD regarding development and update of comprehensive plan of care as evidenced by provider attestation and co-signature Inter-disciplinary care team collaboration (see longitudinal plan of care) Comprehensive medication review performed; medication list updated in electronic medical record  Hypertension (BP goal <140/90) -Controlled -Current treatment: Furosemide 20 mg daily: Appropriate, Effective, Safe, Accessible  Lisinopril-HCTZ 20-12.5 mg daily: Appropriate, Effective, Safe, Accessible  Metoprolol XL 100 mg daily: Appropriate, Effective, Safe, Accessible  -Medications previously tried: NA  -Current home readings:   108/55, pulse 60   97/54, pulse 52   107/55, pulse 55  -Denies hypotensive/hypertensive symptoms -Continue current medications  Hyperlipidemia: (LDL goal < 70) -Controlled: Not addressed during this visit. -Current treatment: Atorvastatin 10 mg daily  -Medications previously tried: NA  -Current dietary patterns: Trying to eat more vegetables, glucerna shakes, eating less overall.  -Current exercise habits: Physical Therapy exercises 3 days  -Educated on Importance of limiting foods high in cholesterol; Exercise goal of 150 minutes per week; -Recommended to continue current medication  Diabetes (A1c goal <8%) -Uncontrolled, but improving.  -Current medications: Farxiga 10 mg daily: Appropriate,  Effective, Safe, Accessible Glipizide XL 10 mg daily: Appropriate, Effective, Safe, Accessible   [name] 50-1000 mg 1/2 tablet twice daily: Appropriate, Effective, Query Safe -Medications previously tried: Ozempic (  -Current home glucose readings  Fasting: 124, 141, 106, 122, 149, 119, 120, 150, 118, 161, 166   -Denies hypoglycemic/hyperglycemic symptoms  -Current meal patterns: Cut down on cheerios.  breakfast: Typically skips  lunch: Smaller portions dinner: Glucerna OR soup + crackers + apple   snacks: Rarely snacks, sometimes will have sugar-free ice pop.    drinks: Water mostly, 3-4 glasses daily.  --Current exercise patterns: Band exercises 3x weekly, 15 minutes each. Walking in home on off days.  -DECREASE [name] to 1/2 tablet twice daily based on real function  Patient Goals/Self-Care Activities Patient will:  - check glucose daily before breakfast, document, and provide at future appointments check blood pressure weekly, document, and provide at future appointments target a minimum of 150 minutes of moderate intensity exercise weekly  Follow Up Plan: ***

## 2022-04-12 MED ORDER — DAPAGLIFLOZIN PROPANEDIOL 10 MG PO TABS: 10.0000 mg | ORAL_TABLET | Freq: Every day | ORAL | 3 refills | Status: AC

## 2022-04-12 NOTE — Patient Instructions (Signed)
Visit Information It was great speaking with you today!  Please let me know if you have any questions about our visit.  Patient Care Plan: General Pharmacy (Adult)     Problem Identified: Hypertension, Hyperlipidemia, Diabetes, Chronic Kidney Disease, Hypothyroidism and Chronic Pain and History of Breast Cancer   Priority: High     Long-Range Goal: Patient-Specific Goal   Start Date: 08/03/2020  Expected End Date: 09/15/2022  This Visit's Progress: On track  Recent Progress: On track  Priority: High  Note:   Current Barriers:  Unable to independently afford treatment regimen Unable to achieve control of diabetes   Pharmacist Clinical Goal(s):  Patient will verbalize ability to afford treatment regimen achieve control of diabetes as evidenced by A1c less than 8% through collaboration with PharmD and provider.   Interventions: 1:1 collaboration with Jerrol Banana., MD regarding development and update of comprehensive plan of care as evidenced by provider attestation and co-signature Inter-disciplinary care team collaboration (see longitudinal plan of care) Comprehensive medication review performed; medication list updated in electronic medical record  Hypertension (BP goal <140/90) -Controlled -Current treatment: Furosemide 20 mg daily: Appropriate, Effective, Safe, Accessible  Lisinopril-HCTZ 20-12.5 mg daily: Appropriate, Effective, Safe, Accessible  Metoprolol XL 100 mg daily: Appropriate, Effective, Safe, Accessible  -Medications previously tried: NA  -Current home readings:   108/55, pulse 60   97/54, pulse 52   107/55, pulse 55  -Denies hypotensive/hypertensive symptoms -Continue current medications  Hyperlipidemia: (LDL goal < 70) -Controlled: Not addressed during this visit. -Current treatment: Atorvastatin 10 mg daily  -Medications previously tried: NA  -Current dietary patterns: Trying to eat more vegetables, glucerna shakes, eating less overall.   -Current exercise habits: Physical Therapy exercises 3 days  -Educated on Importance of limiting foods high in cholesterol; Exercise goal of 150 minutes per week; -Recommended to continue current medication  Diabetes (A1c goal <8%) -Uncontrolled, but improving.  -Current medications: Farxiga 10 mg daily: Appropriate, Effective, Safe, Accessible Glipizide XL 10 mg daily: Appropriate, Effective, Safe, Accessible   [name] 50-1000 mg 1/2 tablet twice daily: Appropriate, Effective, Query Safe -Medications previously tried: Ozempic (  -Current home glucose readings  Fasting: 124, 141, 106, 122, 149, 119, 120, 150, 118, 161, 166   -Denies hypoglycemic/hyperglycemic symptoms  -Current meal patterns: Cut down on cheerios.  breakfast: Typically skips  lunch: Smaller portions dinner: Glucerna OR soup + crackers + apple   snacks: Rarely snacks, sometimes will have sugar-free ice pop.    drinks: Water mostly, 3-4 glasses daily.  --Current exercise patterns: Band exercises 3x weekly, 15 minutes each. Walking in home on off days.  -Continue current medications   Patient Goals/Self-Care Activities Patient will:  - check glucose daily before breakfast, document, and provide at future appointments check blood pressure weekly, document, and provide at future appointments target a minimum of 150 minutes of moderate intensity exercise weekly  Follow Up Plan: CCM enrollment status changed to "previously enrolled" as per patient request on 04/08/22 to discontinue enrollment. Case closed to case management services in primary care home.     Patient agreed to services and verbal consent obtained.   Patient verbalizes understanding of instructions and care plan provided today and agrees to view in [place]. Active MyChart status and patient understanding of how to access instructions and care plan via MyChart confirmed with patient.     [name], PharmD, Para March, CPP  Clinical Pharmacist Practitioner   [facility] [phone]

## 2022-04-14 DIAGNOSIS — D044 Carcinoma in situ of skin of scalp and neck: Secondary | ICD-10-CM | POA: Diagnosis not present

## 2022-04-20 DIAGNOSIS — M9905 Segmental and somatic dysfunction of pelvic region: Secondary | ICD-10-CM | POA: Diagnosis not present

## 2022-04-20 DIAGNOSIS — M5136 Other intervertebral disc degeneration, lumbar region: Secondary | ICD-10-CM | POA: Diagnosis not present

## 2022-04-20 DIAGNOSIS — M9903 Segmental and somatic dysfunction of lumbar region: Secondary | ICD-10-CM | POA: Diagnosis not present

## 2022-04-20 DIAGNOSIS — M7918 Myalgia, other site: Secondary | ICD-10-CM | POA: Diagnosis not present

## 2022-04-20 DIAGNOSIS — M5451 Vertebrogenic low back pain: Secondary | ICD-10-CM | POA: Diagnosis not present

## 2022-04-20 DIAGNOSIS — M25552 Pain in left hip: Secondary | ICD-10-CM | POA: Diagnosis not present

## 2022-04-20 DIAGNOSIS — M5416 Radiculopathy, lumbar region: Secondary | ICD-10-CM | POA: Diagnosis not present

## 2022-05-01 DIAGNOSIS — I10 Essential (primary) hypertension: Secondary | ICD-10-CM

## 2022-05-01 DIAGNOSIS — E1122 Type 2 diabetes mellitus with diabetic chronic kidney disease: Secondary | ICD-10-CM

## 2022-05-01 DIAGNOSIS — N1831 Chronic kidney disease, stage 3a: Secondary | ICD-10-CM

## 2022-05-13 ENCOUNTER — Encounter: Payer: Self-pay | Admitting: Physician Assistant

## 2022-05-13 ENCOUNTER — Ambulatory Visit (INDEPENDENT_AMBULATORY_CARE_PROVIDER_SITE_OTHER): Payer: Medicare Other | Admitting: Physician Assistant

## 2022-05-13 VITALS — BP 122/48 | HR 58 | Temp 98.2°F | Resp 16 | Ht 63.0 in | Wt 188.2 lb

## 2022-05-13 DIAGNOSIS — R319 Hematuria, unspecified: Secondary | ICD-10-CM | POA: Diagnosis not present

## 2022-05-13 DIAGNOSIS — N1831 Chronic kidney disease, stage 3a: Secondary | ICD-10-CM

## 2022-05-13 DIAGNOSIS — E1122 Type 2 diabetes mellitus with diabetic chronic kidney disease: Secondary | ICD-10-CM | POA: Diagnosis not present

## 2022-05-13 LAB — POCT URINALYSIS DIPSTICK
Bilirubin, UA: NEGATIVE
Glucose, UA: POSITIVE — AB
Ketones, UA: NEGATIVE
Nitrite, UA: NEGATIVE
Protein, UA: NEGATIVE
Spec Grav, UA: 1.015 (ref 1.010–1.025)
Urobilinogen, UA: 0.2 E.U./dL
pH, UA: 7 (ref 5.0–8.0)

## 2022-05-13 LAB — POCT GLYCOSYLATED HEMOGLOBIN (HGB A1C)
Est. average glucose Bld gHb Est-mCnc: 326
Hemoglobin A1C: 13.5 % — AB (ref 4.0–5.6)

## 2022-05-13 NOTE — Assessment & Plan Note (Signed)
A1c w/ significant elevation today at 13.5+% BS trend seems to be going back down to normal Asympomatic currently UA today with leuk/trace blood will check culture. Will check cmp, cbc for kidney function, infection

## 2022-05-13 NOTE — Progress Notes (Signed)
I,Joseline E Rosas,acting as a scribe for [employer], PA-C.,have documented all relevant documentation on the behalf of Mikey Kirschner, PA-C,as directed by  Mikey Kirschner, PA-C while in the presence of Mikey Kirschner, PA-C.   Established patient visit   Patient: Carol Ramsey   DOB: Sep 16, 1945   77 y.o. Female  MRN: [id number] Visit Date: 05/13/2022  Today's healthcare provider: Mikey Kirschner, PA-C   Chief Complaint  Patient presents with   Diabetes   Subjective    HPI Patient here for inconsistent blood sugar levels.   Diabetes Mellitus Type II, Follow-up  Lab Results  Component Value Date   HGBA1C 13.5 (A) 05/13/2022   HGBA1C 8.0 (A) 01/11/2022   HGBA1C 8.3 (A) 10/06/2021   Wt Readings from Last 3 Encounters:  05/13/22 188 lb 3.2 oz (85.4 kg)  01/11/22 188 lb (85.3 kg)  10/06/21 188 lb (85.3 kg)   Last seen for diabetes 4 months ago.  Management since then includes continue glipizide '10mg'$  daily, farxiga 10 mg and [name] 50-100 mg 1/2 tablet bid She reports excellent compliance with treatment.  Symptoms: No fatigue No foot ulcerations  No appetite changes No nausea  No paresthesia of the feet  No polydipsia  No polyuria No visual disturbances   No vomiting     Home blood sugar records:  120's-200's and one 495 last week  Reports she was seeing mid-200s numbers for 2-3 weeks and then last week saw 495. She reports eating a bowl of rasin bran cereal and checking after. The next morning it went back to the high 200s.   Denies SOB, cough, wheezing, headache, fever, dysuria, abdominal pain. Reports urinary frequency, 2-3 hours, but this is normal for her. Denies changes in diet, but does report increase stress levels as her son just needed emergency surgery.  Episodes of hypoglycemia? No    Current insulin regiment: none Most Recent Eye Exam: 03/2022   Pertinent Labs: Lab Results  Component Value Date   CHOL 106 06/07/2021   HDL 48 06/07/2021    LDLCALC 35 06/07/2021   TRIG 129 06/07/2021   CHOLHDL 2.2 06/07/2021   Lab Results  Component Value Date   NA 138 11/17/2021   K 4.3 11/17/2021   CREATININE 1.33 (H) 11/17/2021   EGFR 42 (L) 11/17/2021   LABMICR 4.0 11/17/2021   MICRALBCREAT 5 11/17/2021     ---------------------------------------------------------------------------------------------------   Medications: Outpatient Medications Prior to Visit  Medication Sig   acetaminophen (TYLENOL) 500 MG tablet Take 500 mg by mouth every 6 (six) hours as needed for mild pain or moderate pain.   ascorbic acid (VITAMIN C) 500 MG tablet Take 500 mg by mouth daily.   atorvastatin (LIPITOR) 10 MG tablet Take 10 mg by mouth daily at 6 PM.    Cholecalciferol (VITAMIN D) 125 MCG (5000 UT) CAPS Take 5,000 Units by mouth daily.   Cyanocobalamin (VITAMIN B-12) 5000 MCG SUBL Place 5,000 mcg under the tongue daily.   dapagliflozin propanediol (FARXIGA) 10 MG TABS tablet Take 1 tablet (10 mg total) by mouth daily. Patient receives through [name] Patient Assistance   furosemide (LASIX) 20 MG tablet Take 1 tablet (20 mg total) by mouth daily as needed for edema.   glipiZIDE (GLUCOTROL XL) 10 MG 24 hr tablet Take 1 tablet (10 mg total) by mouth daily with breakfast. TAKE 1 TABLET BY MOUTH DAILY   glucose blood (ONETOUCH VERIO) test strip TEST BLOOD SUGAR TWICE DAILY AS INSTRUCTED   lisinopril-hydrochlorothiazide (ZESTORETIC)  20-12.5 MG tablet TAKE 1 TABLET BY MOUTH EVERY MORNING   metoprolol succinate (TOPROL-XL) 100 MG 24 hr tablet TAKE 1 TABLET BY MOUTH DAILY WITH OR IMMEDIATELY FOLLOWING A MEAL   sitaGLIPtin-metformin ([name]) 50-1000 MG tablet Take 0.5 tablets by mouth 2 (two) times daily with a meal. Patient receives through [name] Patient Assistance through Mar 2023.   triamcinolone cream (KENALOG) 0.1 %    Magnesium 500 MG CAPS Take 1 capsule (500 mg total) by mouth at bedtime.   prednisoLONE acetate (PRED FORTE) 1 % ophthalmic suspension     No facility-administered medications prior to visit.    Review of Systems  Constitutional:  Negative for fatigue and fever.  Respiratory:  Negative for cough and shortness of breath.   Cardiovascular:  Negative for chest pain and leg swelling.  Gastrointestinal:  Negative for abdominal pain.  Neurological:  Negative for dizziness and headaches.      Objective    BP (!) 122/48 (BP Location: Right Arm, Patient Position: Sitting, Cuff Size: Normal)   Pulse (!) 58   Temp 98.2 F (36.8 C) (Oral)   Resp 16   Ht '5\' 3"'$  (1.6 m)   Wt 188 lb 3.2 oz (85.4 kg)   SpO2 99%   BMI 33.34 kg/m    Physical Exam Constitutional:      General: She is awake.     Appearance: She is well-developed.  HENT:     Head: Normocephalic.  Eyes:     Conjunctiva/sclera: Conjunctivae normal.  Cardiovascular:     Rate and Rhythm: Normal rate and regular rhythm.     Heart sounds: Normal heart sounds.  Pulmonary:     Effort: Pulmonary effort is normal.     Breath sounds: Normal breath sounds. No stridor. No wheezing, rhonchi or rales.  Skin:    General: Skin is warm.  Neurological:     Mental Status: She is alert and oriented to person, place, and time.  Psychiatric:        Attention and Perception: Attention normal.        Mood and Affect: Mood normal.        Speech: Speech normal.        Behavior: Behavior is cooperative.     Results for orders placed or performed in visit on 05/13/22  POCT glycosylated hemoglobin (Hb A1C)  Result Value Ref Range   Hemoglobin A1C 13.5 (A) 4.0 - 5.6 %   Est. average glucose Bld gHb Est-mCnc 326   POCT Urinalysis Dipstick  Result Value Ref Range   Color, UA Straw Yellow    Clarity, UA Cloudy    Glucose, UA Positive (A) Negative   Bilirubin, UA Negative    Ketones, UA Negative    Spec Grav, UA 1.015 1.010 - 1.025   Blood, UA Trace    pH, UA 7.0 5.0 - 8.0   Protein, UA Negative Negative   Urobilinogen, UA 0.2 0.2 or 1.0 E.U./dL   Nitrite, UA Negative     Leukocytes, UA Moderate (2+) (A) Negative   Appearance     Odor      Assessment & Plan     Problem List Items Addressed This Visit       Endocrine   Type 2 diabetes mellitus with stage 3a chronic kidney disease, without long-term current use of insulin ([name]) - Primary    A1c w/ significant elevation today at 13.5+% BS trend seems to be going back down to normal Asympomatic currently UA today with  leuk/trace blood will check culture. Will check cmp, cbc for kidney function, infection       Relevant Orders   POCT glycosylated hemoglobin (Hb A1C) (Completed)   Comprehensive Metabolic Panel (CMET)   CBC w/Diff/Platelet   HgB A1c   POCT Urinalysis Dipstick (Completed)   Urine Culture   Urine Microscopic   Other Visit Diagnoses     Hematuria, unspecified type       Relevant Orders   Urine Culture   Urine Microscopic       Return if symptoms worsen or fail to improve.      I, Mikey Kirschner, PA-C have reviewed all documentation for this visit. The documentation on  05/13/22 for the exam, diagnosis, procedures, and orders are all accurate and complete.  Mikey Kirschner, PA-C [facility] [address] [place], [place], [address] Office: [phone] Fax: [place]

## 2022-05-14 LAB — URINALYSIS, MICROSCOPIC ONLY
Casts: NONE SEEN /lpf
Epithelial Cells (non renal): >10 /hpf — AB (ref 0–10)
WBC, UA: >30 /hpf — AB (ref 0–5)

## 2022-05-17 ENCOUNTER — Other Ambulatory Visit: Payer: Self-pay | Admitting: Physician Assistant

## 2022-05-17 ENCOUNTER — Other Ambulatory Visit: Payer: Self-pay | Admitting: Oncology

## 2022-05-17 ENCOUNTER — Other Ambulatory Visit: Payer: Self-pay

## 2022-05-17 DIAGNOSIS — N289 Disorder of kidney and ureter, unspecified: Secondary | ICD-10-CM

## 2022-05-17 DIAGNOSIS — N3000 Acute cystitis without hematuria: Secondary | ICD-10-CM

## 2022-05-17 DIAGNOSIS — Z1231 Encounter for screening mammogram for malignant neoplasm of breast: Secondary | ICD-10-CM

## 2022-05-17 LAB — COMPREHENSIVE METABOLIC PANEL
ALT: 8 IU/L (ref 0–32)
AST: 14 IU/L (ref 0–40)
Albumin/Globulin Ratio: 1.2 (ref 1.2–2.2)
Albumin: 3.8 g/dL (ref 3.8–4.8)
Alkaline Phosphatase: 89 IU/L (ref 44–121)
BUN/Creatinine Ratio: 20 (ref 12–28)
BUN: 34 mg/dL — ABNORMAL HIGH (ref 8–27)
Bilirubin Total: 0.3 mg/dL (ref 0.0–1.2)
CO2: 20 mmol/L (ref 20–29)
Calcium: 10 mg/dL (ref 8.7–10.3)
Chloride: 98 mmol/L (ref 96–106)
Creatinine, Ser: 1.72 mg/dL — ABNORMAL HIGH (ref 0.57–1.00)
Globulin, Total: 3.2 g/dL (ref 1.5–4.5)
Glucose: 253 mg/dL — ABNORMAL HIGH (ref 70–99)
Potassium: 4.1 mmol/L (ref 3.5–5.2)
Sodium: 138 mmol/L (ref 134–144)
Total Protein: 7 g/dL (ref 6.0–8.5)
eGFR: 31 mL/min/{1.73_m2} — ABNORMAL LOW (ref >59)

## 2022-05-17 LAB — CBC WITH DIFFERENTIAL/PLATELET
Basophils Absolute: 0.1 10*3/uL (ref 0.0–0.2)
Basos: 1 %
EOS (ABSOLUTE): 0.5 10*3/uL — ABNORMAL HIGH (ref 0.0–0.4)
Eos: 5 %
Hematocrit: 38.8 % (ref 34.0–46.6)
Hemoglobin: 12.3 g/dL (ref 11.1–15.9)
Immature Grans (Abs): 0 10*3/uL (ref 0.0–0.1)
Immature Granulocytes: 0 %
Lymphocytes Absolute: 2.2 10*3/uL (ref 0.7–3.1)
Lymphs: 24 %
MCH: 27.5 pg (ref 26.6–33.0)
MCHC: 31.7 g/dL (ref 31.5–35.7)
MCV: 87 fL (ref 79–97)
Monocytes Absolute: 0.7 10*3/uL (ref 0.1–0.9)
Monocytes: 7 %
Neutrophils Absolute: 5.7 10*3/uL (ref 1.4–7.0)
Neutrophils: 63 %
Platelets: 297 10*3/uL (ref 150–450)
RBC: 4.47 x10E6/uL (ref 3.77–5.28)
RDW: 11.8 % (ref 11.7–15.4)
WBC: 9.2 10*3/uL (ref 3.4–10.8)

## 2022-05-17 LAB — HEMOGLOBIN A1C
Est. average glucose Bld gHb Est-mCnc: 318 mg/dL
Hgb A1c MFr Bld: 12.7 % — ABNORMAL HIGH (ref 4.8–5.6)

## 2022-05-17 LAB — URINE CULTURE

## 2022-05-17 MED ORDER — CIPROFLOXACIN HCL 250 MG PO TABS: 250.0000 mg | ORAL_TABLET | Freq: Two times a day (BID) | ORAL | 0 refills | Status: AC

## 2022-05-18 DIAGNOSIS — M5136 Other intervertebral disc degeneration, lumbar region: Secondary | ICD-10-CM | POA: Diagnosis not present

## 2022-05-18 DIAGNOSIS — M9903 Segmental and somatic dysfunction of lumbar region: Secondary | ICD-10-CM | POA: Diagnosis not present

## 2022-05-18 DIAGNOSIS — M5451 Vertebrogenic low back pain: Secondary | ICD-10-CM | POA: Diagnosis not present

## 2022-05-18 DIAGNOSIS — M7918 Myalgia, other site: Secondary | ICD-10-CM | POA: Diagnosis not present

## 2022-05-18 DIAGNOSIS — M9905 Segmental and somatic dysfunction of pelvic region: Secondary | ICD-10-CM | POA: Diagnosis not present

## 2022-05-18 DIAGNOSIS — M5416 Radiculopathy, lumbar region: Secondary | ICD-10-CM | POA: Diagnosis not present

## 2022-05-18 DIAGNOSIS — M25552 Pain in left hip: Secondary | ICD-10-CM | POA: Diagnosis not present

## 2022-05-26 ENCOUNTER — Other Ambulatory Visit: Payer: Self-pay

## 2022-05-26 DIAGNOSIS — N289 Disorder of kidney and ureter, unspecified: Secondary | ICD-10-CM | POA: Diagnosis not present

## 2022-05-27 ENCOUNTER — Other Ambulatory Visit: Payer: Self-pay | Admitting: Physician Assistant

## 2022-05-27 DIAGNOSIS — E1122 Type 2 diabetes mellitus with diabetic chronic kidney disease: Secondary | ICD-10-CM

## 2022-05-27 DIAGNOSIS — I10 Essential (primary) hypertension: Secondary | ICD-10-CM

## 2022-05-27 DIAGNOSIS — N179 Acute kidney failure, unspecified: Secondary | ICD-10-CM

## 2022-05-27 LAB — COMPREHENSIVE METABOLIC PANEL
ALT: 11 IU/L (ref 0–32)
AST: 18 IU/L (ref 0–40)
Albumin/Globulin Ratio: 1.4 (ref 1.2–2.2)
Albumin: 3.7 g/dL — ABNORMAL LOW (ref 3.8–4.8)
Alkaline Phosphatase: 68 IU/L (ref 44–121)
BUN/Creatinine Ratio: 21 (ref 12–28)
BUN: 37 mg/dL — ABNORMAL HIGH (ref 8–27)
Bilirubin Total: 0.3 mg/dL (ref 0.0–1.2)
CO2: 25 mmol/L (ref 20–29)
Calcium: 9.6 mg/dL (ref 8.7–10.3)
Chloride: 99 mmol/L (ref 96–106)
Creatinine, Ser: 1.8 mg/dL — ABNORMAL HIGH (ref 0.57–1.00)
Globulin, Total: 2.7 g/dL (ref 1.5–4.5)
Glucose: 221 mg/dL — ABNORMAL HIGH (ref 70–99)
Potassium: 4.5 mmol/L (ref 3.5–5.2)
Sodium: 140 mmol/L (ref 134–144)
Total Protein: 6.4 g/dL (ref 6.0–8.5)
eGFR: 29 mL/min/{1.73_m2} — ABNORMAL LOW (ref >59)

## 2022-06-06 DIAGNOSIS — Z85828 Personal history of other malignant neoplasm of skin: Secondary | ICD-10-CM | POA: Diagnosis not present

## 2022-06-06 DIAGNOSIS — D485 Neoplasm of uncertain behavior of skin: Secondary | ICD-10-CM | POA: Diagnosis not present

## 2022-06-06 DIAGNOSIS — C44219 Basal cell carcinoma of skin of left ear and external auricular canal: Secondary | ICD-10-CM | POA: Diagnosis not present

## 2022-06-06 DIAGNOSIS — D2261 Melanocytic nevi of right upper limb, including shoulder: Secondary | ICD-10-CM | POA: Diagnosis not present

## 2022-06-06 DIAGNOSIS — D225 Melanocytic nevi of trunk: Secondary | ICD-10-CM | POA: Diagnosis not present

## 2022-06-06 DIAGNOSIS — L570 Actinic keratosis: Secondary | ICD-10-CM | POA: Diagnosis not present

## 2022-06-06 DIAGNOSIS — D2262 Melanocytic nevi of left upper limb, including shoulder: Secondary | ICD-10-CM | POA: Diagnosis not present

## 2022-06-08 ENCOUNTER — Ambulatory Visit (INDEPENDENT_AMBULATORY_CARE_PROVIDER_SITE_OTHER): Payer: Medicare Other

## 2022-06-08 VITALS — BP 122/68 | Ht 63.0 in | Wt 184.5 lb

## 2022-06-08 DIAGNOSIS — E639 Nutritional deficiency, unspecified: Secondary | ICD-10-CM

## 2022-06-08 DIAGNOSIS — Z0000 Encounter for general adult medical examination without abnormal findings: Secondary | ICD-10-CM | POA: Diagnosis not present

## 2022-06-08 NOTE — Patient Instructions (Signed)
Carol Ramsey , Thank you for taking time to come for your Medicare Wellness Visit. I appreciate your ongoing commitment to your health goals. Please review the following plan we discussed and let me know if I can assist you in the future.   These are the goals we discussed:  Goals      DIET - EAT MORE FRUITS AND VEGETABLES     Exercise 3x per week (30 min per time)     Recommend to try the "sit down exercises" for 3 days a week for at least 20-30 minutes.      Have 3 meals a day     Starting 05/12/15, I will work to make sure I am eating 3 meals a day.     Monitor and Manage My Blood Sugar-Diabetes Type 2     Timeframe:  Long-Range Goal Priority:  High Start Date:  08/03/2020                           Expected End Date: 02/03/2023                      Follow Up within 30 days   - check blood sugar twice daily (before breakfast and after supper) - check blood sugar if I feel it is too high or too low - enter blood sugar readings and medication or insulin into daily log - take the blood sugar log to all doctor visits    Why is this important?   Checking your blood sugar at home helps to keep it from getting very high or very low.  Writing the results in a diary or log helps the doctor know how to care for you.  Your blood sugar log should have the time, date and the results.  Also, write down the amount of insulin or other medicine that you take.  Other information, like what you ate, exercise done and how you were feeling, will also be helpful.     Notes:         This is a list of the screening recommended for you and due dates:  Health Maintenance  Topic Date Due   Zoster (Shingles) Vaccine (1 of 2) Never done   DEXA scan (bone density measurement)  11/27/2016   Complete foot exam   01/26/2018   COVID-19 Vaccine (4 - 2023-24 season) 12/31/2021   Colon Cancer Screening  01/18/2022   Hemoglobin A1C  11/11/2022   Yearly kidney health urinalysis for diabetes  11/18/2022   Eye  exam for diabetics  03/30/2023   Yearly kidney function blood test for diabetes  05/27/2023   Medicare Annual Wellness Visit  06/09/2023   DTaP/Tdap/Td vaccine (3 - Td or Tdap) 05/11/2026   Pneumonia Vaccine  Completed   Flu Shot  Completed   Hepatitis C Screening: USPSTF Recommendation to screen - Ages 43-79 yo.  Completed   HPV Vaccine  Aged Out    Advanced directives: no  Conditions/risks identified: falls risk  Next appointment: Follow up in one year for your annual wellness visit 06/14/2023 @ 10am/in-person   Preventive Care 65 Years and Older, Female Preventive care refers to lifestyle choices and visits with your health care provider that can promote health and wellness. What does preventive care include? A yearly physical exam. This is also called an annual well check. Dental exams once or twice a year. Routine eye exams. Ask your health care provider how  often you should have your eyes checked. Personal lifestyle choices, including: Daily care of your teeth and gums. Regular physical activity. Eating a healthy diet. Avoiding tobacco and drug use. Limiting alcohol use. Practicing safe sex. Taking low-dose aspirin every day. Taking vitamin and mineral supplements as recommended by your health care provider. What happens during an annual well check? The services and screenings done by your health care provider during your annual well check will depend on your age, overall health, lifestyle risk factors, and family history of disease. Counseling  Your health care provider may ask you questions about your: Alcohol use. Tobacco use. Drug use. Emotional well-being. Home and relationship well-being. Sexual activity. Eating habits. History of falls. Memory and ability to understand (cognition). Work and work Statistician. Reproductive health. Screening  You may have the following tests or measurements: Height, weight, and BMI. Blood pressure. Lipid and cholesterol  levels. These may be checked every 5 years, or more frequently if you are over 7 years old. Skin check. Lung cancer screening. You may have this screening every year starting at age 48 if you have a 30-pack-year history of smoking and currently smoke or have quit within the past 15 years. Fecal occult blood test (FOBT) of the stool. You may have this test every year starting at age 10. Flexible sigmoidoscopy or colonoscopy. You may have a sigmoidoscopy every 5 years or a colonoscopy every 10 years starting at age 27. Hepatitis C blood test. Hepatitis B blood test. Sexually transmitted disease (STD) testing. Diabetes screening. This is done by checking your blood sugar (glucose) after you have not eaten for a while (fasting). You may have this done every 1-3 years. Bone density scan. This is done to screen for osteoporosis. You may have this done starting at age 65. Mammogram. This may be done every 1-2 years. Talk to your health care provider about how often you should have regular mammograms. Talk with your health care provider about your test results, treatment options, and if necessary, the need for more tests. Vaccines  Your health care provider may recommend certain vaccines, such as: Influenza vaccine. This is recommended every year. Tetanus, diphtheria, and acellular pertussis (Tdap, Td) vaccine. You may need a Td booster every 10 years. Zoster vaccine. You may need this after age 38. Pneumococcal 13-valent conjugate (PCV13) vaccine. One dose is recommended after age 59. Pneumococcal polysaccharide (PPSV23) vaccine. One dose is recommended after age 19. Talk to your health care provider about which screenings and vaccines you need and how often you need them. This information is not intended to replace advice given to you by your health care provider. Make sure you discuss any questions you have with your health care provider. Document Released: 05/15/2015 Document Revised: 01/06/2016  Document Reviewed: 02/17/2015 Elsevier Interactive Patient Education  2017 [place] Prevention in the Home Falls can cause injuries. They can happen to people of all ages. There are many things you can do to make your home safe and to help prevent falls. What can I do on the outside of my home? Regularly fix the edges of walkways and driveways and fix any cracks. Remove anything that might make you trip as you walk through a door, such as a raised step or threshold. Trim any bushes or trees on the path to your home. Use bright outdoor lighting. Clear any walking paths of anything that might make someone trip, such as rocks or tools. Regularly check to see if handrails are loose or  broken. Make sure that both sides of any steps have handrails. Any raised decks and porches should have guardrails on the edges. Have any leaves, snow, or ice cleared regularly. Use sand or salt on walking paths during winter. Clean up any spills in your garage right away. This includes oil or grease spills. What can I do in the bathroom? Use night lights. Install grab bars by the toilet and in the tub and shower. Do not use towel bars as grab bars. Use non-skid mats or decals in the tub or shower. If you need to sit down in the shower, use a plastic, non-slip stool. Keep the floor dry. Clean up any water that spills on the floor as soon as it happens. Remove soap buildup in the tub or shower regularly. Attach bath mats securely with double-sided non-slip rug tape. Do not have throw rugs and other things on the floor that can make you trip. What can I do in the bedroom? Use night lights. Make sure that you have a light by your bed that is easy to reach. Do not use any sheets or blankets that are too big for your bed. They should not hang down onto the floor. Have a firm chair that has side arms. You can use this for support while you get dressed. Do not have throw rugs and other things on the floor  that can make you trip. What can I do in the kitchen? Clean up any spills right away. Avoid walking on wet floors. Keep items that you use a lot in easy-to-reach places. If you need to reach something above you, use a strong step stool that has a grab bar. Keep electrical cords out of the way. Do not use floor polish or wax that makes floors slippery. If you must use wax, use non-skid floor wax. Do not have throw rugs and other things on the floor that can make you trip. What can I do with my stairs? Do not leave any items on the stairs. Make sure that there are handrails on both sides of the stairs and use them. Fix handrails that are broken or loose. Make sure that handrails are as long as the stairways. Check any carpeting to make sure that it is firmly attached to the stairs. Fix any carpet that is loose or worn. Avoid having throw rugs at the top or bottom of the stairs. If you do have throw rugs, attach them to the floor with carpet tape. Make sure that you have a light switch at the top of the stairs and the bottom of the stairs. If you do not have them, ask someone to add them for you. What else can I do to help prevent falls? Wear shoes that: Do not have high heels. Have rubber bottoms. Are comfortable and fit you well. Are closed at the toe. Do not wear sandals. If you use a stepladder: Make sure that it is fully opened. Do not climb a closed stepladder. Make sure that both sides of the stepladder are locked into place. Ask someone to hold it for you, if possible. Clearly mark and make sure that you can see: Any grab bars or handrails. First and last steps. Where the edge of each step is. Use tools that help you move around (mobility aids) if they are needed. These include: Canes. Walkers. Scooters. Crutches. Turn on the lights when you go into a dark area. Replace any light bulbs as soon as they burn out. Set up  your furniture so you have a clear path. Avoid moving your  furniture around. If any of your floors are uneven, fix them. If there are any pets around you, be aware of where they are. Review your medicines with your doctor. Some medicines can make you feel dizzy. This can increase your chance of falling. Ask your doctor what other things that you can do to help prevent falls. This information is not intended to replace advice given to you by your health care provider. Make sure you discuss any questions you have with your health care provider. Document Released: 02/12/2009 Document Revised: 09/24/2015 Document Reviewed: 05/23/2014 Elsevier Interactive Patient Education  2017 [employer].

## 2022-06-08 NOTE — Progress Notes (Signed)
Subjective:   Carol Ramsey is a 77 y.o. female who presents for Medicare Annual (Subsequent) preventive examination.  Review of Systems     Cardiac Risk Factors include: advanced age (>32mn, >>80women);diabetes mellitus;dyslipidemia;obesity (BMI >30kg/m2);hypertension;sedentary lifestyle     Objective:    Today's Vitals   06/08/22 1309 06/08/22 1311  Weight: 184 lb 8 oz (83.7 kg)   Height: '5\' 3"'$  (1.6 m)   PainSc:  5    Body mass index is 32.68 kg/m.     06/08/2022    1:36 PM 06/07/2021   10:26 AM 10/23/2020   10:24 AM 06/01/2020   11:19 AM 04/21/2020    1:46 PM 11/26/2019    1:24 PM 10/16/2019    3:18 PM  Advanced Directives  Does Patient Have a Medical Advance Directive? No No No No No No No  Does patient want to make changes to medical advance directive?  No - Patient declined       Would patient like information on creating a medical advance directive? Yes (ED - Information included in AVS) No - Patient declined No - Patient declined No - Patient declined No - Patient declined No - Patient declined No - Patient declined    Current Medications (verified) Outpatient Encounter Medications as of 06/08/2022  Medication Sig   acetaminophen (TYLENOL) 500 MG tablet Take 500 mg by mouth every 6 (six) hours as needed for mild pain or moderate pain.   ascorbic acid (VITAMIN C) 500 MG tablet Take 500 mg by mouth daily.   atorvastatin (LIPITOR) 10 MG tablet Take 10 mg by mouth daily at 6 PM.    Cholecalciferol (VITAMIN D) 125 MCG (5000 UT) CAPS Take 5,000 Units by mouth daily.   Cyanocobalamin (VITAMIN B-12) 5000 MCG SUBL Place 5,000 mcg under the tongue daily.   dapagliflozin propanediol (FARXIGA) 10 MG TABS tablet Take 1 tablet (10 mg total) by mouth daily. Patient receives through [name] Patient Assistance   glipiZIDE (GLUCOTROL XL) 10 MG 24 hr tablet Take 1 tablet (10 mg total) by mouth daily with breakfast. TAKE 1 TABLET BY MOUTH DAILY   glucose blood (ONETOUCH VERIO) test strip  TEST BLOOD SUGAR TWICE DAILY AS INSTRUCTED   lisinopril-hydrochlorothiazide (ZESTORETIC) 20-12.5 MG tablet TAKE 1 TABLET BY MOUTH EVERY MORNING   metoprolol succinate (TOPROL-XL) 100 MG 24 hr tablet TAKE 1 TABLET BY MOUTH DAILY WITH OR IMMEDIATELY FOLLOWING A MEAL   sitaGLIPtin-metformin ([name]) 50-1000 MG tablet Take 0.5 tablets by mouth 2 (two) times daily with a meal. Patient receives through [place] Assistance through Mar 2023.   triamcinolone cream (KENALOG) 0.1 %    furosemide (LASIX) 20 MG tablet Take 1 tablet (20 mg total) by mouth daily as needed for edema. (Patient not taking: Reported on 06/08/2022)   Magnesium 500 MG CAPS Take 1 capsule (500 mg total) by mouth at bedtime.   prednisoLONE acetate (PRED FORTE) 1 % ophthalmic suspension  (Patient not taking: Reported on 06/08/2022)   No facility-administered encounter medications on file as of 06/08/2022.    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   Diabetes mellitus without complication ([name])    Hyperlipidemia    Hypertension    Hyperthyroidism  Obesity    Personal history of radiation therapy    Right lumpectomy 2018   Thyroid disease    hyperthyroidism   Vitamin D deficiency    Past Surgical History:  Procedure Laterality Date   BREAST BIOPSY Right 06/28/2016   stereotactic biopsy - DUCTAL CARCINOMA IN SITU (DCIS), HIGH NUCLEAR GRADE  (very narrow margins)   BREAST LUMPECTOMY Right 07/18/2016   DCIS   JOINT REPLACEMENT Left    tkr   KNEE SURGERY Left    arthroscopy   MASTECTOMY, PARTIAL Right 07/18/2016   Procedure: MASTECTOMY PARTIAL;  Surgeon: Robert Bellow, MD;  Location: [facility] ORS;  Service: General;   Laterality: Right;   SKIN CANCER EXCISION     face   Family History  Problem Relation Age of Onset   Hypertension Mother    Cervical cancer Mother    Heart disease Father    Hypertension Father    Lung cancer Father    Lung cancer Sister    Brain cancer Sister    Diabetes Brother    Heart disease Brother    Hypertension Brother    Stroke Brother    Breast cancer Neg Hx    Social History   Socioeconomic History   Marital status: Married    Spouse name: Not on file   Number of children: 1   Years of education: Not on file   Highest education level: Bachelor's degree (e.g., BA, AB, BS)  Occupational History   Occupation: retired  Tobacco Use   Smoking status: Never   Smokeless tobacco: Never  Vaping Use   Vaping Use: Never used  Substance and Sexual Activity   Alcohol use: No   Drug use: No   Sexual activity: Never  Other Topics Concern   Not on file  Social History Narrative   Not on file   Social Determinants of Health   Financial Resource Strain: Low Risk  (06/08/2022)   Overall Financial Resource Strain (CARDIA)    Difficulty of Paying Living Expenses: Not very hard  Food Insecurity: No Food Insecurity (06/08/2022)   Hunger Vital Sign    Worried About Running Out of Food in the Last Year: Never true    Ran Out of Food in the Last Year: Never true  Transportation Needs: No Transportation Needs (06/08/2022)   PRAPARE - Hydrologist (Medical): No    Lack of Transportation (Non-Medical): No  Physical Activity: Inactive (06/08/2022)   Exercise Vital Sign    Days of Exercise per Week: 0 days    Minutes of Exercise per Session: 0 min  Stress: Stress Concern Present (06/08/2022)   [place]    Feeling of Stress : To some extent  Social Connections: Socially Isolated (06/08/2022)   Social Connection and Isolation Panel [NHANES]    Frequency of Communication with Friends and  Family: Never    Frequency of Social Gatherings with Friends and Family: Once a week    Attends Religious Services: Never    Marine scientist or Organizations: No    Attends Music therapist: Never    Marital Status: Married    Tobacco Counseling Counseling given: Not Answered   Clinical Intake:  Pre-visit preparation completed: Yes  Pain : 0-10 Pain Score: 5  Pain Type: Chronic pain Pain Location: Back Pain Orientation: Lateral Pain Descriptors / Indicators: Aching Pain Onset: More than a month ago Pain Frequency: Intermittent Pain Relieving  Factors: heating pad,tylenol Effect of Pain on Daily Activities: cannot walk far or much  Pain Relieving Factors: heating pad,tylenol  BMI - recorded: 32.68 Nutritional Risks: None Diabetes: Yes CBG done?: No (315 this am) Did pt. bring in CBG monitor from home?: No  How often do you need to have someone help you when you read instructions, pamphlets, or other written materials from your doctor or pharmacy?: 1 - Never  Diabetic?yes  Interpreter Needed?: No  Information entered by :: B.Merri Dimaano,LPN   Activities of Daily Living    06/08/2022    1:37 PM 06/04/2022   10:02 AM  In your present state of health, do you have any difficulty performing the following activities:  Hearing? 0 0  Vision? 1 0  Difficulty concentrating or making decisions? 0 0  Walking or climbing stairs? 1 1  Dressing or bathing? 0 0  Doing errands, shopping? 1 1  Comment cannot walk very ar without lots o back pain   Preparing Food and eating ? N Y  Using the Toilet? N N  In the past six months, have you accidently leaked urine? N N  Do you have problems with loss of bowel control? N N  Managing your Medications? N N  Managing your Finances? N N  Housekeeping or managing your Housekeeping? Tempie Donning  Comment husband and son does     Patient Care Team: Mikey Kirschner, PA-C as PCP - General (Physician Assistant) Dereck Leep, MD  as Consulting Physician (Orthopedic Surgery) Dasher, Rayvon Char, MD as Consulting Physician (Dermatology) Corey Skains, MD as Consulting Physician (Cardiology) Thelma Comp, [place] as Consulting Physician (Optometry) Byrnett, Forest Gleason, MD (General Surgery) Noreene Filbert, MD as Referring Physician (Radiation Oncology) Lloyd Huger, MD as Consulting Physician (Oncology) Germaine Pomfret, [facility] (Pharmacist)  Indicate any recent Medical Services you may have received from other than [facility] providers in the past year (date may be approximate).     Assessment:   This is a routine wellness examination for Audi.  Hearing/Vision screen Hearing Screening   '125Hz'$   Right ear Dr Ruby Cola  Left ear   Comments: Slight hearing loss  Vision Screening - Comments:: Adequate vision;readers only;Dr Bulacoski  Dietary issues and exercise activities discussed: Current Exercise Habits: The patient does not participate in regular exercise at present, Exercise limited by: orthopedic condition(s)   Goals Addressed             This Visit's Progress    DIET - EAT MORE FRUITS AND VEGETABLES   Not on track    Exercise 3x per week (30 min per time)   Not on track    Recommend to try the "sit down exercises" for 3 days a week for at least 20-30 minutes.      Have 3 meals a day   Worsening    Starting 05/12/15, I will work to make sure I am eating 3 meals a day.     Monitor and Manage My Blood Sugar-Diabetes Type 2   Not on track    Timeframe:  Long-Range Goal Priority:  High Start Date:  08/03/2020                           Expected End Date: 02/03/2023                      Follow Up within 30 days   - check blood sugar twice daily (before  breakfast and after supper) - check blood sugar if I feel it is too high or too low - enter blood sugar readings and medication or insulin into daily log - take the blood sugar log to all doctor visits    Why is this important?   Checking your blood  sugar at home helps to keep it from getting very high or very low.  Writing the results in a diary or log helps the doctor know how to care for you.  Your blood sugar log should have the time, date and the results.  Also, write down the amount of insulin or other medicine that you take.  Other information, like what you ate, exercise done and how you were feeling, will also be helpful.     Notes:        Depression Screen    06/08/2022    1:29 PM 05/13/2022   10:17 AM 06/07/2021   10:23 AM 06/01/2020   11:17 AM 04/02/2020   11:22 AM 02/19/2020   10:14 AM 05/30/2019    9:14 AM  PHQ 2/9 Scores  PHQ - 2 Score 0 0 0 0 0 0 0  PHQ- 9 Score      0 0    Fall Risk    06/08/2022    1:18 PM 06/04/2022   10:02 AM 05/13/2022   10:17 AM 06/07/2021   10:27 AM 06/01/2020   11:19 AM  Fall Risk   Falls in the past year? 1 0 0 0 0  Number falls in past yr: 0 0 0 0 0  Injury with Fall? 0 0 0 0 0  Risk for fall due to : Impaired mobility  No Fall Risks No Fall Risks   Follow up Education provided;Falls prevention discussed   Falls evaluation completed     FALL RISK PREVENTION PERTAINING TO THE HOME:  Any stairs in or around the home? Yes  If so, are there any without handrails? Yes  Home free of loose throw rugs in walkways, pet beds, electrical cords, etc? Yes  Adequate lighting in your home to reduce risk of falls? Yes   ASSISTIVE DEVICES UTILIZED TO PREVENT FALLS:  Life alert? No  Use of a cane, walker or w/c? Yes cane Grab bars in the bathroom? no Shower chair or bench in shower? Yes  Elevated toilet seat or a handicapped toilet? Yes   TIMED UP AND GO:  Was the test performed? Yes .  Length of time to ambulate 10 feet: 12 sec.   Gait slow and steady with assistive device  Cognitive Function:        06/08/2022    1:39 PM 05/30/2019    9:20 AM 05/11/2016   11:46 AM  6CIT Screen  What Year? 0 points 0 points 0 points  What month? 0 points 0 points 0 points  What time? 0 points 0  points 0 points  Count back from 20 0 points 0 points 0 points  Months in reverse 0 points 0 points 0 points  Repeat phrase 0 points 0 points 0 points  Total Score 0 points 0 points 0 points    Immunizations Immunization History  Administered Date(s) Administered   Fluad Quad(high Dose 65+) 01/29/2019, 01/30/2020, 01/11/2022   Influenza Split 02/25/2006, 03/07/2011, 02/27/2012   Influenza, High Dose Seasonal PF 03/03/2016, 01/26/2017, 01/08/2018   Influenza,inj,Quad PF,6+ Mos 03/04/2013   PFIZER(Purple Top)SARS-COV-2 Vaccination 06/11/2019, 07/02/2019, 01/31/2020   Pneumococcal Conjugate-13 04/13/2015   Pneumococcal Polysaccharide-23 02/17/2007, 02/27/2012  Td 10/21/2003   Tdap 05/11/2016   Zoster, Live 11/08/2010    TDAP status: Up to date  Flu Vaccine status: Up to date  Pneumococcal vaccine status: Up to date  Covid-19 vaccine status: Completed vaccines  Qualifies for Shingles Vaccine? Yes   Zostavax completed No   Shingrix Completed?: No.    Education has been provided regarding the importance of this vaccine. Patient has been advised to call insurance company to determine out of pocket expense if they have not yet received this vaccine. Advised may also receive vaccine at local pharmacy or Health Dept. Verbalized acceptance and understanding.  Screening Tests Health Maintenance  Topic Date Due   Zoster Vaccines- Shingrix (1 of 2) Never done   DEXA SCAN  11/27/2016   FOOT EXAM  01/26/2018   COVID-19 Vaccine (4 - 2023-24 season) 12/31/2021   COLONOSCOPY (Pts 45-8yr Insurance coverage will need to be confirmed)  01/18/2022   HEMOGLOBIN A1C  11/11/2022   Diabetic kidney evaluation - Urine ACR  11/18/2022   OPHTHALMOLOGY EXAM  03/30/2023   Diabetic kidney evaluation - eGFR measurement  05/27/2023   Medicare Annual Wellness (AWV)  06/09/2023   DTaP/Tdap/Td (3 - Td or Tdap) 05/11/2026   Pneumonia Vaccine 77 Years old  Completed   INFLUENZA VACCINE  Completed    Hepatitis C Screening  Completed   HPV VACCINES  Aged Out    Health Maintenance  Health Maintenance Due  Topic Date Due   Zoster Vaccines- Shingrix (1 of 2) Never done   DEXA SCAN  11/27/2016   FOOT EXAM  01/26/2018   COVID-19 Vaccine (4 - 2023-24 season) 12/31/2021   COLONOSCOPY (Pts 45-467yrInsurance coverage will need to be confirmed)  01/18/2022    Colorectal cancer screening: No longer required.   Mammogram status: Completed no. Repeat every yearhas appt in march already  Bone Density;pt declines as she saws she cannot lie on her back Lung Cancer Screening: (Low Dose CT Chest recommended if Age 76-80ears, 30 pack-year currently smoking OR have quit w/in 15years.) does not qualify.   Lung Cancer Screening Referral: no  Additional Screening:  Hepatitis C Screening: does not qualify; Completed no  Vision Screening: Recommended annual ophthalmology exams for early detection of glaucoma and other disorders of the eye. Is the patient up to date with their annual eye exam?  Yes  Who is the provider or what is the name of the office in which the patient attends annual eye exams? Dr BuRuby Colaf pt is not established with a provider, would they like to be referred to a provider to establish care? No .   Dental Screening: Recommended annual dental exams for proper oral hygiene  Community Resource Referral / Chronic Care Management: CRR required this visit?  No   CCM required this visit?  No      Plan:     I have personally reviewed and noted the following in the patient's chart:   Medical and social history Use of alcohol, tobacco or illicit drugs  Current medications and supplements including opioid prescriptions. Patient is not currently taking opioid prescriptions. Functional ability and status Nutritional status Physical activity Advanced directives List of other physicians Hospitalizations, surgeries, and ER visits in previous 12 months Vitals Screenings to  include cognitive, depression, and falls Referrals and appointments  In addition, I have reviewed and discussed with patient certain preventive protocols, quality metrics, and best practice recommendations. A written personalized care plan for preventive services as well as general  preventive health recommendations were provided to patient.     Roger Shelter, LPN   09/06/8467   Nurse Notes: pt states she is having very high blood sugars despite trying watch her diet/sugar intake. Pt also states her quality of life is poor right now due to moderate to severe back pain when stands too long. Pt states she has appt with Dr Rosanna Randy next week. Referral made for DM nutritionist.

## 2022-06-10 DIAGNOSIS — N1831 Chronic kidney disease, stage 3a: Secondary | ICD-10-CM | POA: Diagnosis not present

## 2022-06-10 DIAGNOSIS — M1711 Unilateral primary osteoarthritis, right knee: Secondary | ICD-10-CM | POA: Diagnosis not present

## 2022-06-10 DIAGNOSIS — E1122 Type 2 diabetes mellitus with diabetic chronic kidney disease: Secondary | ICD-10-CM | POA: Diagnosis not present

## 2022-06-10 DIAGNOSIS — E782 Mixed hyperlipidemia: Secondary | ICD-10-CM | POA: Diagnosis not present

## 2022-06-10 DIAGNOSIS — N1832 Chronic kidney disease, stage 3b: Secondary | ICD-10-CM | POA: Diagnosis not present

## 2022-06-10 DIAGNOSIS — H1031 Unspecified acute conjunctivitis, right eye: Secondary | ICD-10-CM | POA: Diagnosis not present

## 2022-06-15 DIAGNOSIS — M5451 Vertebrogenic low back pain: Secondary | ICD-10-CM | POA: Diagnosis not present

## 2022-06-15 DIAGNOSIS — M7918 Myalgia, other site: Secondary | ICD-10-CM | POA: Diagnosis not present

## 2022-06-15 DIAGNOSIS — M9903 Segmental and somatic dysfunction of lumbar region: Secondary | ICD-10-CM | POA: Diagnosis not present

## 2022-06-15 DIAGNOSIS — M5136 Other intervertebral disc degeneration, lumbar region: Secondary | ICD-10-CM | POA: Diagnosis not present

## 2022-06-15 DIAGNOSIS — M9905 Segmental and somatic dysfunction of pelvic region: Secondary | ICD-10-CM | POA: Diagnosis not present

## 2022-06-15 DIAGNOSIS — M5416 Radiculopathy, lumbar region: Secondary | ICD-10-CM | POA: Diagnosis not present

## 2022-06-15 DIAGNOSIS — M25552 Pain in left hip: Secondary | ICD-10-CM | POA: Diagnosis not present

## 2022-06-16 DIAGNOSIS — I152 Hypertension secondary to endocrine disorders: Secondary | ICD-10-CM | POA: Diagnosis not present

## 2022-06-16 DIAGNOSIS — E1169 Type 2 diabetes mellitus with other specified complication: Secondary | ICD-10-CM | POA: Diagnosis not present

## 2022-06-16 DIAGNOSIS — E785 Hyperlipidemia, unspecified: Secondary | ICD-10-CM | POA: Diagnosis not present

## 2022-06-16 DIAGNOSIS — E1122 Type 2 diabetes mellitus with diabetic chronic kidney disease: Secondary | ICD-10-CM | POA: Diagnosis not present

## 2022-06-16 DIAGNOSIS — E1165 Type 2 diabetes mellitus with hyperglycemia: Secondary | ICD-10-CM | POA: Diagnosis not present

## 2022-06-16 DIAGNOSIS — N184 Chronic kidney disease, stage 4 (severe): Secondary | ICD-10-CM | POA: Diagnosis not present

## 2022-06-16 DIAGNOSIS — E1159 Type 2 diabetes mellitus with other circulatory complications: Secondary | ICD-10-CM | POA: Diagnosis not present

## 2022-06-16 DIAGNOSIS — E11319 Type 2 diabetes mellitus with unspecified diabetic retinopathy without macular edema: Secondary | ICD-10-CM | POA: Diagnosis not present

## 2022-06-17 ENCOUNTER — Telehealth: Payer: Self-pay | Admitting: Physician Assistant

## 2022-06-17 DIAGNOSIS — I10 Essential (primary) hypertension: Secondary | ICD-10-CM

## 2022-06-17 MED ORDER — LISINOPRIL-HYDROCHLOROTHIAZIDE 20-12.5 MG PO TABS: 1.0000 | ORAL_TABLET | Freq: Every morning | ORAL | 1 refills | Status: DC

## 2022-06-17 MED ORDER — METOPROLOL SUCCINATE ER 100 MG PO TB24: ORAL_TABLET | ORAL | 1 refills | Status: DC

## 2022-06-17 NOTE — Telephone Encounter (Signed)
[facility] pharmacy requesting refill lisinopril-hydrochlorothiazide (ZESTORETIC) 20-12.5 MG tablet  Please advise

## 2022-06-17 NOTE — Telephone Encounter (Signed)
[facility] pharmacy requesting refill metoprolol succinate (TOPROL-XL) 100 MG 24 hr tablet  Please advise

## 2022-06-29 DIAGNOSIS — E1165 Type 2 diabetes mellitus with hyperglycemia: Secondary | ICD-10-CM | POA: Diagnosis not present

## 2022-06-30 DIAGNOSIS — I119 Hypertensive heart disease without heart failure: Secondary | ICD-10-CM | POA: Diagnosis not present

## 2022-06-30 DIAGNOSIS — N1831 Chronic kidney disease, stage 3a: Secondary | ICD-10-CM | POA: Diagnosis not present

## 2022-06-30 DIAGNOSIS — E1122 Type 2 diabetes mellitus with diabetic chronic kidney disease: Secondary | ICD-10-CM | POA: Diagnosis not present

## 2022-06-30 DIAGNOSIS — I493 Ventricular premature depolarization: Secondary | ICD-10-CM | POA: Diagnosis not present

## 2022-06-30 DIAGNOSIS — E782 Mixed hyperlipidemia: Secondary | ICD-10-CM | POA: Diagnosis not present

## 2022-06-30 DIAGNOSIS — N1832 Chronic kidney disease, stage 3b: Secondary | ICD-10-CM | POA: Diagnosis not present

## 2022-06-30 DIAGNOSIS — R600 Localized edema: Secondary | ICD-10-CM | POA: Diagnosis not present

## 2022-06-30 DIAGNOSIS — I272 Pulmonary hypertension, unspecified: Secondary | ICD-10-CM | POA: Diagnosis not present

## 2022-06-30 DIAGNOSIS — R0602 Shortness of breath: Secondary | ICD-10-CM | POA: Diagnosis not present

## 2022-06-30 DIAGNOSIS — I071 Rheumatic tricuspid insufficiency: Secondary | ICD-10-CM | POA: Diagnosis not present

## 2022-06-30 DIAGNOSIS — I10 Essential (primary) hypertension: Secondary | ICD-10-CM | POA: Diagnosis not present

## 2022-07-05 ENCOUNTER — Ambulatory Visit
Admission: RE | Admit: 2022-07-05 | Discharge: 2022-07-05 | Disposition: A | Discharge status: Home / Self Care | Payer: Medicare Other | Source: Ambulatory Visit | Attending: Oncology | Admitting: Oncology

## 2022-07-05 DIAGNOSIS — Z1231 Encounter for screening mammogram for malignant neoplasm of breast: Secondary | ICD-10-CM

## 2022-07-06 DIAGNOSIS — L988 Other specified disorders of the skin and subcutaneous tissue: Secondary | ICD-10-CM | POA: Diagnosis not present

## 2022-07-06 DIAGNOSIS — Z85828 Personal history of other malignant neoplasm of skin: Secondary | ICD-10-CM | POA: Diagnosis not present

## 2022-07-06 DIAGNOSIS — L578 Other skin changes due to chronic exposure to nonionizing radiation: Secondary | ICD-10-CM | POA: Diagnosis not present

## 2022-07-06 DIAGNOSIS — C44219 Basal cell carcinoma of skin of left ear and external auricular canal: Secondary | ICD-10-CM | POA: Diagnosis not present

## 2022-07-13 DIAGNOSIS — R600 Localized edema: Secondary | ICD-10-CM | POA: Diagnosis not present

## 2022-07-13 DIAGNOSIS — E1122 Type 2 diabetes mellitus with diabetic chronic kidney disease: Secondary | ICD-10-CM | POA: Diagnosis not present

## 2022-07-13 DIAGNOSIS — N184 Chronic kidney disease, stage 4 (severe): Secondary | ICD-10-CM | POA: Diagnosis not present

## 2022-07-13 DIAGNOSIS — N189 Chronic kidney disease, unspecified: Secondary | ICD-10-CM | POA: Diagnosis not present

## 2022-07-13 DIAGNOSIS — I129 Hypertensive chronic kidney disease with stage 1 through stage 4 chronic kidney disease, or unspecified chronic kidney disease: Secondary | ICD-10-CM | POA: Diagnosis not present

## 2022-07-18 DIAGNOSIS — E11319 Type 2 diabetes mellitus with unspecified diabetic retinopathy without macular edema: Secondary | ICD-10-CM | POA: Diagnosis not present

## 2022-07-18 DIAGNOSIS — E1122 Type 2 diabetes mellitus with diabetic chronic kidney disease: Secondary | ICD-10-CM | POA: Diagnosis not present

## 2022-07-18 DIAGNOSIS — I152 Hypertension secondary to endocrine disorders: Secondary | ICD-10-CM | POA: Diagnosis not present

## 2022-07-18 DIAGNOSIS — E1169 Type 2 diabetes mellitus with other specified complication: Secondary | ICD-10-CM | POA: Diagnosis not present

## 2022-07-18 DIAGNOSIS — E785 Hyperlipidemia, unspecified: Secondary | ICD-10-CM | POA: Diagnosis not present

## 2022-07-18 DIAGNOSIS — E1165 Type 2 diabetes mellitus with hyperglycemia: Secondary | ICD-10-CM | POA: Diagnosis not present

## 2022-07-18 DIAGNOSIS — N184 Chronic kidney disease, stage 4 (severe): Secondary | ICD-10-CM | POA: Diagnosis not present

## 2022-07-18 DIAGNOSIS — E1159 Type 2 diabetes mellitus with other circulatory complications: Secondary | ICD-10-CM | POA: Diagnosis not present

## 2022-07-20 DIAGNOSIS — M5416 Radiculopathy, lumbar region: Secondary | ICD-10-CM | POA: Diagnosis not present

## 2022-07-20 DIAGNOSIS — M5136 Other intervertebral disc degeneration, lumbar region: Secondary | ICD-10-CM | POA: Diagnosis not present

## 2022-07-20 DIAGNOSIS — M9905 Segmental and somatic dysfunction of pelvic region: Secondary | ICD-10-CM | POA: Diagnosis not present

## 2022-07-20 DIAGNOSIS — M5451 Vertebrogenic low back pain: Secondary | ICD-10-CM | POA: Diagnosis not present

## 2022-07-20 DIAGNOSIS — M9903 Segmental and somatic dysfunction of lumbar region: Secondary | ICD-10-CM | POA: Diagnosis not present

## 2022-07-20 DIAGNOSIS — M7918 Myalgia, other site: Secondary | ICD-10-CM | POA: Diagnosis not present

## 2022-07-20 DIAGNOSIS — M25552 Pain in left hip: Secondary | ICD-10-CM | POA: Diagnosis not present

## 2022-07-21 DIAGNOSIS — I071 Rheumatic tricuspid insufficiency: Secondary | ICD-10-CM | POA: Diagnosis not present

## 2022-07-21 DIAGNOSIS — I272 Pulmonary hypertension, unspecified: Secondary | ICD-10-CM | POA: Diagnosis not present

## 2022-07-21 DIAGNOSIS — R0602 Shortness of breath: Secondary | ICD-10-CM | POA: Diagnosis not present

## 2022-07-26 DIAGNOSIS — Z96652 Presence of left artificial knee joint: Secondary | ICD-10-CM | POA: Diagnosis not present

## 2022-07-26 DIAGNOSIS — M1711 Unilateral primary osteoarthritis, right knee: Secondary | ICD-10-CM | POA: Diagnosis not present

## 2022-07-29 ENCOUNTER — Ambulatory Visit: Payer: Medicare Other | Admitting: Dietician

## 2022-08-16 DIAGNOSIS — N1831 Chronic kidney disease, stage 3a: Secondary | ICD-10-CM | POA: Diagnosis not present

## 2022-08-16 DIAGNOSIS — E538 Deficiency of other specified B group vitamins: Secondary | ICD-10-CM | POA: Diagnosis not present

## 2022-08-16 DIAGNOSIS — Z794 Long term (current) use of insulin: Secondary | ICD-10-CM | POA: Diagnosis not present

## 2022-08-16 DIAGNOSIS — M1711 Unilateral primary osteoarthritis, right knee: Secondary | ICD-10-CM | POA: Diagnosis not present

## 2022-08-16 DIAGNOSIS — I129 Hypertensive chronic kidney disease with stage 1 through stage 4 chronic kidney disease, or unspecified chronic kidney disease: Secondary | ICD-10-CM | POA: Diagnosis not present

## 2022-08-16 DIAGNOSIS — E1122 Type 2 diabetes mellitus with diabetic chronic kidney disease: Secondary | ICD-10-CM | POA: Diagnosis not present

## 2022-08-16 DIAGNOSIS — E782 Mixed hyperlipidemia: Secondary | ICD-10-CM | POA: Diagnosis not present

## 2022-08-16 DIAGNOSIS — G894 Chronic pain syndrome: Secondary | ICD-10-CM | POA: Diagnosis not present

## 2022-08-17 DIAGNOSIS — M5136 Other intervertebral disc degeneration, lumbar region: Secondary | ICD-10-CM | POA: Diagnosis not present

## 2022-08-17 DIAGNOSIS — M25552 Pain in left hip: Secondary | ICD-10-CM | POA: Diagnosis not present

## 2022-08-17 DIAGNOSIS — M5451 Vertebrogenic low back pain: Secondary | ICD-10-CM | POA: Diagnosis not present

## 2022-08-17 DIAGNOSIS — M5416 Radiculopathy, lumbar region: Secondary | ICD-10-CM | POA: Diagnosis not present

## 2022-08-17 DIAGNOSIS — M7918 Myalgia, other site: Secondary | ICD-10-CM | POA: Diagnosis not present

## 2022-08-17 DIAGNOSIS — M9903 Segmental and somatic dysfunction of lumbar region: Secondary | ICD-10-CM | POA: Diagnosis not present

## 2022-08-17 DIAGNOSIS — M9905 Segmental and somatic dysfunction of pelvic region: Secondary | ICD-10-CM | POA: Diagnosis not present

## 2022-08-31 DIAGNOSIS — I152 Hypertension secondary to endocrine disorders: Secondary | ICD-10-CM | POA: Diagnosis not present

## 2022-08-31 DIAGNOSIS — E1169 Type 2 diabetes mellitus with other specified complication: Secondary | ICD-10-CM | POA: Diagnosis not present

## 2022-08-31 DIAGNOSIS — E785 Hyperlipidemia, unspecified: Secondary | ICD-10-CM | POA: Diagnosis not present

## 2022-08-31 DIAGNOSIS — E1159 Type 2 diabetes mellitus with other circulatory complications: Secondary | ICD-10-CM | POA: Diagnosis not present

## 2022-08-31 DIAGNOSIS — E1165 Type 2 diabetes mellitus with hyperglycemia: Secondary | ICD-10-CM | POA: Diagnosis not present

## 2022-08-31 DIAGNOSIS — E1122 Type 2 diabetes mellitus with diabetic chronic kidney disease: Secondary | ICD-10-CM | POA: Diagnosis not present

## 2022-08-31 DIAGNOSIS — E11319 Type 2 diabetes mellitus with unspecified diabetic retinopathy without macular edema: Secondary | ICD-10-CM | POA: Diagnosis not present

## 2022-08-31 DIAGNOSIS — N184 Chronic kidney disease, stage 4 (severe): Secondary | ICD-10-CM | POA: Diagnosis not present

## 2022-09-15 ENCOUNTER — Telehealth: Payer: Self-pay | Admitting: Physician Assistant

## 2022-09-15 ENCOUNTER — Other Ambulatory Visit: Payer: Self-pay

## 2022-09-15 DIAGNOSIS — E119 Type 2 diabetes mellitus without complications: Secondary | ICD-10-CM

## 2022-09-15 NOTE — Telephone Encounter (Signed)
[facility] pharmacy faxed refill request for the following medications:   glipiZIDE (GLUCOTROL XL) 10 MG 24 hr tablet    Please advise

## 2022-09-15 NOTE — Telephone Encounter (Signed)
[facility] pharmacy requesting prescription refill glipiZIDE (GLUCOTROL XL) 10 MG 24 hr tablet   Please advise

## 2022-09-21 DIAGNOSIS — M5416 Radiculopathy, lumbar region: Secondary | ICD-10-CM | POA: Diagnosis not present

## 2022-09-21 DIAGNOSIS — M7918 Myalgia, other site: Secondary | ICD-10-CM | POA: Diagnosis not present

## 2022-09-21 DIAGNOSIS — M9905 Segmental and somatic dysfunction of pelvic region: Secondary | ICD-10-CM | POA: Diagnosis not present

## 2022-09-21 DIAGNOSIS — M5136 Other intervertebral disc degeneration, lumbar region: Secondary | ICD-10-CM | POA: Diagnosis not present

## 2022-09-21 DIAGNOSIS — M9903 Segmental and somatic dysfunction of lumbar region: Secondary | ICD-10-CM | POA: Diagnosis not present

## 2022-09-21 DIAGNOSIS — M25552 Pain in left hip: Secondary | ICD-10-CM | POA: Diagnosis not present

## 2022-09-21 DIAGNOSIS — M5451 Vertebrogenic low back pain: Secondary | ICD-10-CM | POA: Diagnosis not present

## 2022-10-04 DIAGNOSIS — R600 Localized edema: Secondary | ICD-10-CM | POA: Diagnosis not present

## 2022-10-04 DIAGNOSIS — N189 Chronic kidney disease, unspecified: Secondary | ICD-10-CM | POA: Diagnosis not present

## 2022-10-04 DIAGNOSIS — E1122 Type 2 diabetes mellitus with diabetic chronic kidney disease: Secondary | ICD-10-CM | POA: Diagnosis not present

## 2022-10-04 DIAGNOSIS — I129 Hypertensive chronic kidney disease with stage 1 through stage 4 chronic kidney disease, or unspecified chronic kidney disease: Secondary | ICD-10-CM | POA: Diagnosis not present

## 2022-10-04 DIAGNOSIS — N184 Chronic kidney disease, stage 4 (severe): Secondary | ICD-10-CM | POA: Diagnosis not present

## 2022-10-13 DIAGNOSIS — I10 Essential (primary) hypertension: Secondary | ICD-10-CM | POA: Diagnosis not present

## 2022-10-13 DIAGNOSIS — R600 Localized edema: Secondary | ICD-10-CM | POA: Insufficient documentation

## 2022-10-13 DIAGNOSIS — N189 Chronic kidney disease, unspecified: Secondary | ICD-10-CM | POA: Diagnosis not present

## 2022-10-13 DIAGNOSIS — N1831 Chronic kidney disease, stage 3a: Secondary | ICD-10-CM | POA: Diagnosis not present

## 2022-10-13 DIAGNOSIS — E1122 Type 2 diabetes mellitus with diabetic chronic kidney disease: Secondary | ICD-10-CM | POA: Diagnosis not present

## 2022-10-19 DIAGNOSIS — M9903 Segmental and somatic dysfunction of lumbar region: Secondary | ICD-10-CM | POA: Diagnosis not present

## 2022-10-19 DIAGNOSIS — M5451 Vertebrogenic low back pain: Secondary | ICD-10-CM | POA: Diagnosis not present

## 2022-10-19 DIAGNOSIS — M5136 Other intervertebral disc degeneration, lumbar region: Secondary | ICD-10-CM | POA: Diagnosis not present

## 2022-10-19 DIAGNOSIS — M7918 Myalgia, other site: Secondary | ICD-10-CM | POA: Diagnosis not present

## 2022-10-19 DIAGNOSIS — M5416 Radiculopathy, lumbar region: Secondary | ICD-10-CM | POA: Diagnosis not present

## 2022-10-19 DIAGNOSIS — M9905 Segmental and somatic dysfunction of pelvic region: Secondary | ICD-10-CM | POA: Diagnosis not present

## 2022-10-19 DIAGNOSIS — M25552 Pain in left hip: Secondary | ICD-10-CM | POA: Diagnosis not present

## 2022-10-20 DIAGNOSIS — M1711 Unilateral primary osteoarthritis, right knee: Secondary | ICD-10-CM | POA: Diagnosis not present

## 2022-10-20 DIAGNOSIS — E1165 Type 2 diabetes mellitus with hyperglycemia: Secondary | ICD-10-CM | POA: Diagnosis not present

## 2022-11-16 DIAGNOSIS — M5451 Vertebrogenic low back pain: Secondary | ICD-10-CM | POA: Diagnosis not present

## 2022-11-16 DIAGNOSIS — M7918 Myalgia, other site: Secondary | ICD-10-CM | POA: Diagnosis not present

## 2022-11-16 DIAGNOSIS — M5136 Other intervertebral disc degeneration, lumbar region: Secondary | ICD-10-CM | POA: Diagnosis not present

## 2022-11-16 DIAGNOSIS — M9903 Segmental and somatic dysfunction of lumbar region: Secondary | ICD-10-CM | POA: Diagnosis not present

## 2022-11-16 DIAGNOSIS — M25552 Pain in left hip: Secondary | ICD-10-CM | POA: Diagnosis not present

## 2022-11-16 DIAGNOSIS — M5416 Radiculopathy, lumbar region: Secondary | ICD-10-CM | POA: Diagnosis not present

## 2022-11-16 DIAGNOSIS — M9905 Segmental and somatic dysfunction of pelvic region: Secondary | ICD-10-CM | POA: Diagnosis not present

## 2022-11-18 NOTE — Discharge Instructions (Addendum)
Instructions after Total Knee Replacement   James P. Angie Fava., M.D.    Dept. of Orthopaedics & Sports Medicine [facility] [address] [place], [place]  [address]  Phone: [phone]   Fax: [phone]       [email or web address]       DIET: Drink plenty of non-alcoholic fluids. Resume your normal diet. Include foods high in fiber.  ACTIVITY:  You may use crutches or a walker with weight-bearing as tolerated, unless instructed otherwise. You may be weaned off of the walker or crutches by your Physical Therapist.  Do NOT place pillows under the knee. Anything placed under the knee could limit your ability to straighten the knee.   Continue doing gentle exercises. Exercising will reduce the pain and swelling, increase motion, and prevent muscle weakness.   Please continue to use the TED compression stockings for 6 weeks. You may remove the stockings at night, but should reapply them in the morning. Do not drive or operate any equipment until instructed.  WOUND CARE:  Continue to use the PolarCare or ice packs periodically to reduce pain and swelling. You may bathe or shower after the staples are removed at the first office visit following surgery. The Aquacel bandage remains in place for 7 days postoperatively.  This can be changed out with a honeycomb dressing, at home PT can help with this.  MEDICATIONS: You may resume your regular medications. Please take the pain medication as prescribed on the medication. Do not take pain medication on an empty stomach. You have been given a prescription for a blood thinner -aspirin 81 mg twice a day.  Please utilize this medication in conjunction with your TED hose to prevent DVTs  No alcoholic beverages when taking pain medications.  CALL THE OFFICE FOR: Temperature above 101 degrees Excessive bleeding or drainage on the dressing. Excessive swelling, coldness, or paleness of the toes. Persistent nausea and  vomiting.  FOLLOW-UP:  You should have an appointment to return to the office in 10-14 days after surgery. Arrangements have been made for continuation of Physical Therapy (either home therapy or outpatient therapy).     [facility] Department Directory         [email or web address]       [email or web address]          Cardiology  Appointments: [place] - [phone]  Endocrinology  Appointments: [place] [phone] [place] - [phone]  Gastroenterology  Appointments: [place] [phone] [place] - [phone]        General Surgery   Appointments: [facility]  Internal Medicine/Family Medicine  Appointments: [facility] [place] - [phone] [place] - [phone]  Metabolic and Weigh Loss Surgery  Appointments: [facility]        Neurology  Appointments: [place] [phone] [place] - [phone]  Neurosurgery  Appointments: [place]  Obstetrics & Gynecology  Appointments: [place] [phone] [place] - [phone]        Pediatrics  Appointments: [place] [phone] [place] - [phone]  Physiatry  Appointments: [place] [phone]  Physical Therapy  Appointments: [place] - [phone]        Podiatry  Appointments: [place] [phone] [place] - [phone]  Pulmonology  Appointments: [place]  Rheumatology  Appointments: [place] [phone]        [place] Location: [facility]  [address] [place], [place]  [address]  [place] Location: [facility] [address] [place], [place]  [address]  [place] Location: [facility] [address] [place], [place]  [address]

## 2022-11-22 ENCOUNTER — Encounter
Admission: RE | Admit: 2022-11-22 | Discharge: 2022-11-22 | Disposition: A | Discharge status: Home / Self Care | Payer: Medicare Other | Source: Ambulatory Visit | Attending: Orthopedic Surgery | Admitting: Orthopedic Surgery

## 2022-11-22 ENCOUNTER — Other Ambulatory Visit: Payer: Self-pay

## 2022-11-22 VITALS — BP 157/59 | HR 67 | Resp 16 | Ht 63.0 in | Wt 173.9 lb

## 2022-11-22 DIAGNOSIS — N1831 Chronic kidney disease, stage 3a: Secondary | ICD-10-CM | POA: Insufficient documentation

## 2022-11-22 DIAGNOSIS — E538 Deficiency of other specified B group vitamins: Secondary | ICD-10-CM | POA: Insufficient documentation

## 2022-11-22 DIAGNOSIS — E1122 Type 2 diabetes mellitus with diabetic chronic kidney disease: Secondary | ICD-10-CM | POA: Insufficient documentation

## 2022-11-22 DIAGNOSIS — Z01810 Encounter for preprocedural cardiovascular examination: Secondary | ICD-10-CM | POA: Diagnosis not present

## 2022-11-22 DIAGNOSIS — E8809 Other disorders of plasma-protein metabolism, not elsewhere classified: Secondary | ICD-10-CM

## 2022-11-22 DIAGNOSIS — Z01818 Encounter for other preprocedural examination: Secondary | ICD-10-CM | POA: Insufficient documentation

## 2022-11-22 DIAGNOSIS — M1711 Unilateral primary osteoarthritis, right knee: Secondary | ICD-10-CM | POA: Insufficient documentation

## 2022-11-22 DIAGNOSIS — E119 Type 2 diabetes mellitus without complications: Secondary | ICD-10-CM

## 2022-11-22 DIAGNOSIS — D559 Anemia due to enzyme disorder, unspecified: Secondary | ICD-10-CM | POA: Insufficient documentation

## 2022-11-22 DIAGNOSIS — Z880 Allergy status to penicillin: Secondary | ICD-10-CM

## 2022-11-22 DIAGNOSIS — Z01812 Encounter for preprocedural laboratory examination: Secondary | ICD-10-CM

## 2022-11-22 DIAGNOSIS — N183 Chronic kidney disease, stage 3 unspecified: Secondary | ICD-10-CM

## 2022-11-22 HISTORY — DX: Radiculopathy, lumbar region: M54.16

## 2022-11-22 HISTORY — DX: Other enthesopathy of unspecified foot and ankle: M77.50

## 2022-11-22 HISTORY — DX: Cardiac murmur, unspecified: R01.1

## 2022-11-22 HISTORY — DX: Sacrococcygeal disorders, not elsewhere classified: M53.3

## 2022-11-22 HISTORY — DX: Localized edema: R60.0

## 2022-11-22 HISTORY — DX: Spondylosis without myelopathy or radiculopathy, lumbar region: M47.816

## 2022-11-22 HISTORY — DX: Pulmonary hypertension, unspecified: I27.20

## 2022-11-22 HISTORY — DX: Unspecified malignant neoplasm of skin of other parts of face: C44.309

## 2022-11-22 HISTORY — DX: Hypothyroidism, unspecified: E03.9

## 2022-11-22 HISTORY — DX: Hypomagnesemia: E83.42

## 2022-11-22 HISTORY — DX: Chronic pain syndrome: G89.4

## 2022-11-22 HISTORY — DX: Other intervertebral disc displacement, lumbar region: M51.26

## 2022-11-22 HISTORY — DX: Chronic kidney disease, stage 3 unspecified: N18.30

## 2022-11-22 HISTORY — DX: Lymphedema, not elsewhere classified: I89.0

## 2022-11-22 HISTORY — DX: Other intervertebral disc degeneration, lumbar region: M51.36

## 2022-11-22 HISTORY — DX: Other intervertebral disc degeneration, lumbar region without mention of lumbar back pain or lower extremity pain: M51.369

## 2022-11-22 HISTORY — DX: Deficiency of other specified B group vitamins: E53.8

## 2022-11-22 HISTORY — DX: Spinal stenosis, lumbar region without neurogenic claudication: M48.061

## 2022-11-22 HISTORY — DX: Other disorders of plasma-protein metabolism, not elsewhere classified: E88.09

## 2022-11-22 LAB — TYPE AND SCREEN
ABO/RH(D): A POS
Antibody Screen: NEGATIVE

## 2022-11-22 LAB — URINALYSIS, ROUTINE W REFLEX MICROSCOPIC
Bilirubin Urine: NEGATIVE
Glucose, UA: >=500 mg/dL — AB
Hgb urine dipstick: NEGATIVE
Ketones, ur: NEGATIVE mg/dL
Nitrite: NEGATIVE
Protein, ur: NEGATIVE mg/dL
Specific Gravity, Urine: 1.012 (ref 1.005–1.030)
pH: 6 (ref 5.0–8.0)

## 2022-11-22 LAB — CBC
HCT: 41.5 % (ref 36.0–46.0)
Hemoglobin: 13.1 g/dL (ref 12.0–15.0)
MCH: 26.3 pg (ref 26.0–34.0)
MCHC: 31.6 g/dL (ref 30.0–36.0)
MCV: 83.2 fL (ref 80.0–100.0)
Platelets: 311 10*3/uL (ref 150–400)
RBC: 4.99 MIL/uL (ref 3.87–5.11)
RDW: 14.6 % (ref 11.5–15.5)
WBC: 6.3 10*3/uL (ref 4.0–10.5)
nRBC: 0 % (ref 0.0–0.2)

## 2022-11-22 LAB — HEMOGLOBIN A1C
Hgb A1c MFr Bld: 6.8 % — ABNORMAL HIGH (ref 4.8–5.6)
Mean Plasma Glucose: 148.46 mg/dL

## 2022-11-22 LAB — COMPREHENSIVE METABOLIC PANEL
ALT: 14 U/L (ref 0–44)
AST: 22 U/L (ref 15–41)
Albumin: 3.9 g/dL (ref 3.5–5.0)
Alkaline Phosphatase: 62 U/L (ref 38–126)
Anion gap: 9 (ref 5–15)
BUN: 17 mg/dL (ref 8–23)
CO2: 26 mmol/L (ref 22–32)
Calcium: 9.3 mg/dL (ref 8.9–10.3)
Chloride: 97 mmol/L — ABNORMAL LOW (ref 98–111)
Creatinine, Ser: 1.02 mg/dL — ABNORMAL HIGH (ref 0.44–1.00)
GFR, Estimated: 57 mL/min — ABNORMAL LOW (ref >60)
Glucose, Bld: 120 mg/dL — ABNORMAL HIGH (ref 70–99)
Potassium: 3.7 mmol/L (ref 3.5–5.1)
Sodium: 132 mmol/L — ABNORMAL LOW (ref 135–145)
Total Bilirubin: 1 mg/dL (ref 0.3–1.2)
Total Protein: 7.7 g/dL (ref 6.5–8.1)

## 2022-11-22 LAB — C-REACTIVE PROTEIN: CRP: <0.5 mg/dL (ref <1.0)

## 2022-11-22 LAB — SEDIMENTATION RATE: Sed Rate: 14 mm/hr (ref 0–30)

## 2022-11-22 LAB — SURGICAL PCR SCREEN
MRSA, PCR: NEGATIVE
Staphylococcus aureus: POSITIVE — AB

## 2022-11-22 NOTE — Patient Instructions (Addendum)
Your procedure is scheduled on: 11/30/22 - Wednesday Report to the Registration Desk on the 1st floor of the [facility]. To find out your arrival time, please call [phone] between 1PM - 3PM on: 11/29/22 - Tuesday If your arrival time is 6:00 am, do not arrive before that time as the [facility] entrance doors do not open until 6:00 am.  REMEMBER: Instructions that are not followed completely may result in serious medical risk, up to and including death; or upon the discretion of your surgeon and anesthesiologist your surgery may need to be rescheduled.  Do not eat food after midnight the night before surgery.  No gum chewing or hard candies.  You may drink water up to 2 hours before you are scheduled to arrive for your surgery. Do not drink anything within 2 hours of your scheduled arrival time.   In addition, your doctor has ordered for you to drink the provided:  Gatorade G2 Drinking this carbohydrate drink up to two hours before surgery helps to reduce insulin resistance and improve patient outcomes. Please complete drinking 2 hours before scheduled arrival time.  One week prior to surgery beginning 11/23/22,  Stop Anti-inflammatories (NSAIDS) such as Advil, Aleve, Ibuprofen, Motrin, Naproxen, Naprosyn and Aspirin based products such as Excedrin, Goody's Powder, BC Powder. You may however, continue to take Tylenol if needed for pain up until the day of surgery.  Stop beginning 11/23/22, ANY OVER THE COUNTER supplements until after surgery.  Continue taking all prescribed medications with the exception of the following:  dapagliflozin propanediol (FARXIGA) hold beginning 11/27/22. Semaglutide (RYBELSUS) hold beginning 11/29/22. insulin glargine (LANTUS) inject 1/2 of your prescribe dose on the night before your surgery IF NEEDED.   TAKE ONLY THESE MEDICATIONS THE MORNING OF SURGERY WITH A SIP OF WATER:  metoprolol succinate (TOPROL-XL)    No Alcohol for 24 hours before  or after surgery.  No Smoking including e-cigarettes for 24 hours before surgery.  No chewable tobacco products for at least 6 hours before surgery.  No nicotine patches on the day of surgery.  Do not use any "recreational" drugs for at least a week (preferably 2 weeks) before your surgery.  Please be advised that the combination of cocaine and anesthesia may have negative outcomes, up to and including death. If you test positive for cocaine, your surgery will be cancelled.  On the morning of surgery brush your teeth with toothpaste and water, you may rinse your mouth with mouthwash if you wish. Do not swallow any toothpaste or mouthwash.  Use CHG Soap or wipes as directed on instruction sheet.  Do not wear jewelry, make-up, hairpins, clips or nail polish.  Do not wear lotions, powders, or perfumes.   Contact lenses, hearing aids and dentures may not be worn into surgery.  Do not bring valuables to the hospital. [facility] is not responsible for any missing/lost belongings or valuables.   Notify your doctor if there is any change in your medical condition (cold, fever, infection).  Wear comfortable clothing (specific to your surgery type) to the hospital.  After surgery, you can help prevent lung complications by doing breathing exercises.  Take deep breaths and cough every 1-2 hours. Your doctor may order a device called an Incentive Spirometer to help you take deep breaths. When coughing or sneezing, hold a pillow firmly against your incision with both hands. This is called "splinting." Doing this helps protect your incision. It also decreases belly discomfort.  If you are being admitted  to the hospital overnight, leave your suitcase in the car. After surgery it may be brought to your room.  In case of increased patient census, it may be necessary for you, the patient, to continue your postoperative care in the Same Day Surgery department.  If you are being discharged the day of  surgery, you will not be allowed to drive home. You will need a responsible individual to drive you home and stay with you for 24 hours after surgery.   If you are taking public transportation, you will need to have a responsible individual with you.  Please call the Pre-admissions Testing Dept. at [phone] if you have any questions about these instructions.  Surgery Visitation Policy:  Patients having surgery or a procedure may have two visitors.  Children under the age of 40 must have an adult with them who is not the patient.  Inpatient Visitation:    Visiting hours are 7 a.m. to 8 p.m. Up to four visitors are allowed at one time in a patient room. The visitors may rotate out with other people during the day.  One visitor age 65 or older may stay with the patient overnight and must be in the room by 8 p.m.    Pre-operative 5 CHG Bath Instructions   You can play a key role in reducing the risk of infection after surgery. Your skin needs to be as free of germs as possible. You can reduce the number of germs on your skin by washing with CHG (chlorhexidine gluconate) soap before surgery. CHG is an antiseptic soap that kills germs and continues to kill germs even after washing.   DO NOT use if you have an allergy to chlorhexidine/CHG or antibacterial soaps. If your skin becomes reddened or irritated, stop using the CHG and notify one of our RNs at [phone].   Please shower with the CHG soap starting 4 days before surgery using the following schedule: 07/27 - 07/31.    Please keep in mind the following:  DO NOT shave, including legs and underarms, starting the day of your first shower.   You may shave your face at any point before/day of surgery.  Place clean sheets on your bed the day you start using CHG soap. Use a clean washcloth (not used since being washed) for each shower. DO NOT sleep with pets once you start using the CHG.   CHG Shower Instructions:  If you choose  to wash your hair and private area, wash first with your normal shampoo/soap.  After you use shampoo/soap, rinse your hair and body thoroughly to remove shampoo/soap residue.  Turn the water OFF and apply about 3 tablespoons (45 ml) of CHG soap to a CLEAN washcloth.  Apply CHG soap ONLY FROM YOUR NECK DOWN TO YOUR TOES (washing for 3-5 minutes)  DO NOT use CHG soap on face, private areas, open wounds, or sores.  Pay special attention to the area where your surgery is being performed.  If you are having back surgery, having someone wash your back for you may be helpful. Wait 2 minutes after CHG soap is applied, then you may rinse off the CHG soap.  Pat dry with a clean towel  Put on clean clothes/pajamas   If you choose to wear lotion, please use ONLY the CHG-compatible lotions on the back of this paper.     Additional instructions for the day of surgery: DO NOT APPLY any lotions, deodorants, cologne, or perfumes.   Put on clean/comfortable  clothes.  Brush your teeth.  Ask your nurse before applying any prescription medications to the skin.      CHG Compatible Lotions   Aveeno Moisturizing lotion  Cetaphil Moisturizing Cream  Cetaphil Moisturizing Lotion  Clairol Herbal Essence Moisturizing Lotion, Dry Skin  Clairol Herbal Essence Moisturizing Lotion, Extra Dry Skin  Clairol Herbal Essence Moisturizing Lotion, Normal Skin  Curel Age Defying Therapeutic Moisturizing Lotion with Alpha Hydroxy  Curel Extreme Care Body Lotion  Curel Soothing Hands Moisturizing Hand Lotion  Curel Therapeutic Moisturizing Cream, Fragrance-Free  Curel Therapeutic Moisturizing Lotion, Fragrance-Free  Curel Therapeutic Moisturizing Lotion, Original Formula  Eucerin Daily Replenishing Lotion  Eucerin Dry Skin Therapy Plus Alpha Hydroxy Crme  Eucerin Dry Skin Therapy Plus Alpha Hydroxy Lotion  Eucerin Original Crme  Eucerin Original Lotion  Eucerin Plus Crme Eucerin Plus Lotion  Eucerin TriLipid  Replenishing Lotion  Keri Anti-Bacterial Hand Lotion  Keri Deep Conditioning Original Lotion Dry Skin Formula Softly Scented  Keri Deep Conditioning Original Lotion, Fragrance Free Sensitive Skin Formula  Keri Lotion Fast Absorbing Fragrance Free Sensitive Skin Formula  Keri Lotion Fast Absorbing Softly Scented Dry Skin Formula  Keri Original Lotion  Keri Skin Renewal Lotion Keri Silky Smooth Lotion  Keri Silky Smooth Sensitive Skin Lotion  Nivea Body Creamy Conditioning Oil  Nivea Body Extra Enriched Lotion  Nivea Body Original Lotion  Nivea Body Sheer Moisturizing Lotion Nivea Crme  Nivea Skin Firming Lotion  NutraDerm 30 Skin Lotion  NutraDerm Skin Lotion  NutraDerm Therapeutic Skin Cream  NutraDerm Therapeutic Skin Lotion  ProShield Protective Hand Cream  Provon moisturizing lotion  How to Use an Incentive Spirometer  An incentive spirometer is a tool that measures how well you are filling your lungs with each breath. Learning to take long, deep breaths using this tool can help you keep your lungs clear and active. This may help to reverse or lessen your chance of developing breathing (pulmonary) problems, especially infection. You may be asked to use a spirometer: After a surgery. If you have a lung problem or a history of smoking. After a long period of time when you have been unable to move or be active. If the spirometer includes an indicator to show the highest number that you have reached, your health care provider or respiratory therapist will help you set a goal. Keep a log of your progress as told by your health care provider. What are the risks? Breathing too quickly may cause dizziness or cause you to pass out. Take your time so you do not get dizzy or light-headed. If you are in pain, you may need to take pain medicine before doing incentive spirometry. It is harder to take a deep breath if you are having pain. How to use your incentive spirometer  Sit up on the edge  of your bed or on a chair. Hold the incentive spirometer so that it is in an upright position. Before you use the spirometer, breathe out normally. Place the mouthpiece in your mouth. Make sure your lips are closed tightly around it. Breathe in slowly and as deeply as you can through your mouth, causing the piston or the ball to rise toward the top of the chamber. Hold your breath for 3-5 seconds, or for as long as possible. If the spirometer includes a coach indicator, use this to guide you in breathing. Slow down your breathing if the indicator goes above the marked areas. Remove the mouthpiece from your mouth and breathe out normally. The piston  or ball will return to the bottom of the chamber. Rest for a few seconds, then repeat the steps 10 or more times. Take your time and take a few normal breaths between deep breaths so that you do not get dizzy or light-headed. Do this every 1-2 hours when you are awake. If the spirometer includes a goal marker to show the highest number you have reached (best effort), use this as a goal to work toward during each repetition. After each set of 10 deep breaths, cough a few times. This will help to make sure that your lungs are clear. If you have an incision on your chest or abdomen from surgery, place a pillow or a rolled-up towel firmly against the incision when you cough. This can help to reduce pain while taking deep breaths and coughing. General tips When you are able to get out of bed: Walk around often. Continue to take deep breaths and cough in order to clear your lungs. Keep using the incentive spirometer until your health care provider says it is okay to stop using it. If you have been in the hospital, you may be told to keep using the spirometer at home. Contact a health care provider if: You are having difficulty using the spirometer. You have trouble using the spirometer as often as instructed. Your pain medicine is not giving enough relief  for you to use the spirometer as told. You have a fever. Get help right away if: You develop shortness of breath. You develop a cough with bloody mucus from the lungs. You have fluid or blood coming from an incision site after you cough. Summary An incentive spirometer is a tool that can help you learn to take long, deep breaths to keep your lungs clear and active. You may be asked to use a spirometer after a surgery, if you have a lung problem or a history of smoking, or if you have been inactive for a long period of time. Use your incentive spirometer as instructed every 1-2 hours while you are awake. If you have an incision on your chest or abdomen, place a pillow or a rolled-up towel firmly against your incision when you cough. This will help to reduce pain. Get help right away if you have shortness of breath, you cough up bloody mucus, or blood comes from your incision when you cough. This information is not intended to replace advice given to you by your health care provider. Make sure you discuss any questions you have with your health care provider. Document Revised: 07/08/2019 Document Reviewed: 07/08/2019 Elsevier Patient Education  2023 Elsevier Inc.   POLAR CARE INFORMATION  MassAdvertisement.it  How to use [facility] Therapy System?  YouTube   ShippingScam.co.uk  OPERATING INSTRUCTIONS  Start the product With dry hands, connect the transformer to the electrical connection located on the top of the cooler. Next, plug the transformer into an appropriate electrical outlet. The unit will automatically start running at this point.  To stop the pump, disconnect electrical power.  Unplug to stop the product when not in use. Unplugging the Polar Care unit turns it off. Always unplug immediately after use. Never leave it plugged in while unattended. Remove pad.    FIRST ADD WATER TO FILL LINE, THEN ICE---Replace ice when existing ice is almost  melted  1 Discuss Treatment with your Licensed Health Care Practitioner and Use Only as Prescribed 2 Apply Insulation Barrier & Cold Therapy Pad 3 Check for Moisture 4  Inspect Skin Regularly  Tips and Conservation officer, historic buildings Tips 1. Use cubed or chunked ice for optimal performance. 2. It is recommended to drain the Pad between uses. To drain the pad, hold the Pad upright with the hose pointed toward the ground. Depress the black plunger and allow water to drain out. 3. You may disconnect the Pad from the unit without removing the pad from the affected area by depressing the silver tabs on the hose coupling and gently pulling the hoses apart. The Pad and unit will seal itself and will not leak. Note: Some dripping during release is normal. 4. DO NOT RUN PUMP WITHOUT WATER! The pump in this unit is designed to run with water. Running the unit without water will cause permanent damage to the pump. 5. Unplug unit before removing lid.  TROUBLESHOOTING GUIDE Pump not running, Water not flowing to the pad, Pad is not getting cold 1. Make sure the transformer is plugged into the wall outlet. 2. Confirm that the ice and water are filled to the indicated levels. 3. Make sure there are no kinks in the pad. 4. Gently pull on the blue tube to make sure the tube/pad junction is straight. 5. Remove the pad from the treatment site and ll it while the pad is lying at; then reapply. 6. Confirm that the pad couplings are securely attached to the unit. Listen for the double clicks (Figure 1) to confirm the pad couplings are securely attached.  Leaks    Note: Some condensation on the lines, controller, and pads is unavoidable, especially in warmer climates. 1. If using a [employer] Polar Care Cold Therapy unit with a detachable Cold Therapy Pad, and a leak exists (other than condensation on the lines) disconnect the pad couplings. Make sure the silver tabs on the couplings are depressed before reconnecting the pad to  the pump hose; then confirm both sides of the coupling are properly clicked in. 2. If the coupling continues to leak or a leak is detected in the pad itself, stop using it and call [employer] Customer Care at [phone].  Cleaning After use, empty and dry the unit with a soft cloth. Warm water and mild detergent may be used occasionally to clean the pump and tubes.  WARNING: The Polar Care Cube can be cold enough to cause serious injury, including full skin necrosis. Follow these Operating Instructions, and carefully read the Product Insert (see pouch on side of unit) and the Cold Therapy Pad Fitting Instructions (provided with each Cold Therapy Pad) prior to use.

## 2022-11-23 NOTE — Progress Notes (Signed)
  Perioperative Services Pre-Admission/Anesthesia Testing    Date: 11/23/22  Name: Carol Ramsey MRN:   [id number]  Re: GLP-1 clearance and provider recommendations   Planned Surgical Procedure(s):    Case: [id number] Date/Time: 11/30/22 0700   Procedure: COMPUTER ASSISTED TOTAL KNEE ARTHROPLASTY (Right: Knee)   Anesthesia type: Choice   Pre-op diagnosis: PRIMARY OSTEOARTHRITIS OF RIGHT KNEE.   Location: [facility] OR ROOM 01 / [facility]   Surgeons: Donato Heinz, MD      Clinical Notes:  Patient is scheduled for the above procedure with the indicated provider/surgeon. In review of her medication reconciliation it was noted that patient is on a prescribed GLP-1 medication. Per guidelines issued by the American Society of Anesthesiologists (ASA), it is recommended that these medications be held for 7 days prior to the patient undergoing any type of elective surgical procedure. The patient is taking the following GLP-1 medication:  [x]  SEMAGLUTIDE   []  EXENATIDE  []  LIRAGLUTIDE   []  LIXISENATIDE  []  DULAGLUTIDE     []  TIRZEPATIDE (GLP-1/GIP)  Reached out to prescribing provider Huntley Dec, PA-C) to make them aware of the guidelines from anesthesia. Given that this patient takes the prescribed GLP-1 medication for her  diabetes diagnosis, rather than for weight loss, recommendations from the prescribing provider were solicited. Prescribing provider made aware of the following so that informed decision/POC can be developed for this patient that may be taking medications belonging to these drug classes:  Oral GLP-1 medications will be held 1 day prior to surgery.  Injectable GLP-1 medications will be held 7 days prior to surgery.  Metformin is routinely held 48 hours prior to surgery due to renal concerns, potential need for contrasted imaging perioperatively, and the potential for tissue hypoxia leading to drug induced lactic acidosis.  All SGLT2i medications  are held 72 hours prior to surgery as they can be associated with the increased potential for developing euglycemic diabetic ketoacidosis (EDKA).   Impression and Plan:  Carol Ramsey is on a prescribed GLP-1 medication, which induces the known side effect of decreased gastric emptying. Efforts are bring made to mitigate the risk of perioperative hyperglycemic events, as elevated blood glucose levels have been found to contribute to intra/postoperative complications. Additionally, hyperglycemic extremes can potentially necessitate the postponing of a patient's elective case in order to better optimize perioperative glycemic control, again with the aforementioned guidelines in place. With this in mind, recommendations have been sought from the prescribing provider, who has cleared patient to proceed with holding the prescribed GLP-1 as per the guidelines from the ASA.   Provider recommending: no further recommendations received from the prescribing provider.  Copy of signed clearance and recommendations placed on patient's chart for inclusion in their medical record and for review by the surgical/anesthetic team on the day of her procedure.   Quentin Mulling, MSN, APRN, FNP-C, CEN [facility]  Peri-operative Services Nurse Practitioner Phone: [phone] 11/23/22 3:51 PM  NOTE: This note has been prepared using Dragon dictation software. Despite my best ability to proofread, there is always the potential that unintentional transcriptional errors may still occur from this process.

## 2022-11-24 DIAGNOSIS — M1711 Unilateral primary osteoarthritis, right knee: Secondary | ICD-10-CM | POA: Diagnosis not present

## 2022-11-27 ENCOUNTER — Encounter: Payer: Self-pay | Admitting: Orthopedic Surgery

## 2022-11-28 ENCOUNTER — Encounter: Payer: Self-pay | Admitting: Orthopedic Surgery

## 2022-11-28 NOTE — Progress Notes (Signed)
Perioperative / Anesthesia Services  Pre-Admission Testing Clinical Review / Preoperative Anesthesia Consult  Date: 11/28/22  Patient Demographics:  Name: Carol Ramsey DOB:   12/17/1945 MRN:   [id number]  Planned Surgical Procedure(s):    Case: [id number] Date/Time: 11/30/22 0700   Procedure: COMPUTER ASSISTED TOTAL KNEE ARTHROPLASTY (Right: Knee)   Anesthesia type: Choice   Pre-op diagnosis: PRIMARY OSTEOARTHRITIS OF RIGHT KNEE.   Location: [facility] OR ROOM 01 / [facility]   Surgeons: Donato Heinz, MD     NOTE: Available PAT nursing documentation and vital signs have been reviewed. Clinical nursing staff has updated patient's PMH/PSHx, current medication list, and drug allergies/intolerances to ensure comprehensive history available to assist in medical decision making as it pertains to the aforementioned surgical procedure and anticipated anesthetic course. Extensive review of available clinical information personally performed. [place] [facility] and PSHx updated with any diagnoses/procedures that  may have been inadvertently omitted during her intake with the pre-admission testing department's nursing staff.  Clinical Discussion:  Carol Ramsey is a 77 y.o. female who is submitted for pre-surgical anesthesia review and clearance prior to her undergoing the above procedure. Patient has never been a smoker. Pertinent PMH includes: diastolic dysfunction, pulmonary hypertension, PVCs, cardiac murmur, HTN, HLD, T2DM, hypothyroidism, CKD-III, DOE, anemia, remote breast cancer (s/p partial mastectomy + adjuvant XRT), OA, chronic pain syndrome, lumbar DDD, lymphedema  Patient is followed by cardiology Darrold Junker, MD). She was last seen in the cardiology clinic on 06/30/2022; notes reviewed. At the time of her clinic visit, patient doing well overall from a cardiovascular perspective. Patient with chronic peripheral edema that was reported to be stable and at baseline.  She also  experienced a single episode of lightheadedness 2 days prior to her appointment with cardiology.  Her blood pressure was low at 90/58 mmHg.  Patient did not experience any presyncope/syncope. Patient denied any chest pain, shortness of breath, PND, orthopnea, palpitations at weakness, or fatigue. Patient with a past medical history significant for cardiovascular diagnoses. Documented physical exam was grossly benign, providing no evidence of acute exacerbation and/or decompensation of the patient's known cardiovascular conditions.  Most recent TTE was performed on 07/21/2022 revealing a normal left ventricular systolic function with an EF of >55%.  There were no regional wall motion abnormalities. Left ventricular diastolic Doppler parameters consistent with abnormal relaxation (G1DD).  Right ventricular size and function normal.  There was trivial mitral and mild tricuspid valve regurgitation.  All transvalvular gradients were noted to be normal providing no evidence suggestive of valvular stenosis.  RVSP mildly elevated at 40.3 mmHg consistent with patient's known pulmonary hypertension.  Aorta noted to be normal in size with no evidence of aneurysmal dilatation.  Blood pressure well controlled at 120/70 mmHg on currently prescribed diuretic (furosemide PRN + HCTZ), ACEi (lisinopril), and beta-blocker (metoprolol succinate) therapies. Patient is on atorvastatin for her HLD diagnosis and ASCVD prevention. T2DM poorly controlled on currently prescribed regimen; HgbA1c was 12.7% when checked on 05/13/2022.  Of note, patient's hemoglobin A1c level has been rechecked since being seen by cardiology with improvement noted to 6.8% on 11/22/2022. In the setting of known cardiovascular diagnoses and concurrent T2DM, patient is on an SGLT2i (dapagliflozin)  She does not have an OSAH diagnosis. Patient is able to complete all of her  ADL/IADLs without cardiovascular limitation.  Per the [facility], patient is able to achieve at  least 4 METS of physical activity without experiencing any significant degree of angina/anginal equivalent symptoms.  Given her vertiginous symptoms, HCTZ was discontinued.  Patient to continue as needed furosemide.  No other changes were made to her medication regimen.  Patient to follow-up with outpatient cardiology in 6 months or sooner if needed.  Carol Ramsey is scheduled for an elective COMPUTER ASSISTED TOTAL KNEE ARTHROPLASTY (Right: Knee) on 11/30/2022 with Dr. Francesco Sor, MD.  Given patient's past medical history significant for cardiovascular diagnoses, presurgical cardiac clearance was sought by the PAT team. Per cardiology, "this patient is optimized for surgery and may proceed with the planned procedural course with a MODERATE risk of significant perioperative cardiovascular complications".   In review of her medication reconciliation, the patient is not noted to be taking any type of anticoagulation or antiplatelet therapies that would need to be held during her perioperative course.  Patient denies previous perioperative complications with anesthesia in the past. In review of the available records, it is noted that patient underwent a general anesthetic course here at [facility] in 06/2016 without documented complications.      11/22/2022    8:48 AM 06/08/2022    1:09 PM 05/13/2022   10:16 AM  Vitals with BMI  Height 5\' 3"  5\' 3"  5\' 3"   Weight 173 lbs 15 oz 184 lbs 8 oz 188 lbs 3 oz  BMI 30.82 32.69 33.35  Systolic 157 122 621  Diastolic 59 68 48  Pulse 67  58    Providers/Specialists:   NOTE: Primary physician provider listed below. Patient may have been seen by APP or partner within same practice.   PROVIDER ROLE / SPECIALTY LAST OV  Hooten, Illene Labrador, MD Orthopedics (Surgeon) 11/24/2022  Bosie Clos, MD Primary Care Provider 08/16/2022  Marcina Millard, MD Cardiology 06/30/2022  Mosetta Pigeon, MD Nephrology  10/13/2022  Wendall Mola, MD Endocrinology 08/31/2022   Allergies:  Gabapentin, Quinine, Amoxicillin, Dilaudid  [hydromorphone hcl], Etodolac, Hydrochlorothiazide, Hydrocodone, Hydromorphone, and Sulfa antibiotics  Current Home Medications:   No current facility-administered medications for this encounter.    acetaminophen (TYLENOL) 500 MG tablet   ascorbic acid (VITAMIN C) 500 MG tablet   atorvastatin (LIPITOR) 10 MG tablet   Cholecalciferol (VITAMIN D) 125 MCG (5000 UT) CAPS   Cyanocobalamin (VITAMIN B-12) 5000 MCG SUBL   dapagliflozin propanediol (FARXIGA) 10 MG TABS tablet   furosemide (LASIX) 20 MG tablet   glipiZIDE (GLUCOTROL XL) 10 MG 24 hr tablet   glucose blood (ONETOUCH VERIO) test strip   insulin glargine (LANTUS) 100 UNIT/ML injection   lisinopril (ZESTRIL) 20 MG tablet   metoprolol succinate (TOPROL-XL) 100 MG 24 hr tablet   OVER THE COUNTER MEDICATION   OVER THE COUNTER MEDICATION   OVER THE COUNTER MEDICATION   Semaglutide (RYBELSUS) 7 MG TABS   History:   Past Medical History:  Diagnosis Date   Anemia    Arthritis    Basal cell carcinoma (BCC) of lower leg    Bilateral leg edema    Calcaneal bursitis (heel), unspecified laterality    Chronic pain syndrome    CKD (chronic kidney disease) stage 3, GFR 30-59 ml/min ([name])    COVID-19 04/2021   DDD (degenerative disc disease), lumbar    Diastolic dysfunction    a.) TTE 12/19/2016: EF >55%, mod LVH, triv AR, mild MR/TR/PR, RVSP 57.5; b.) TTE 02/10/2017: EF >55%, mild BAE, mild RVE, triv PR, mild MR, mod TR, RVSP 69.9, G1DD; c.) TTE 07/21/2022: EF >55%, triv MR, mild TR, RVSP 40.3, G1DD   DOE (  dyspnea on exertion)    Ductal carcinoma in situ (DCIS) of right breast 2018   a.) s/p partial mastectomy + adjuvant XRT   Heart murmur    History of bilateral cataract extraction    Hyperlipidemia    Hypertension    Hypoalbuminemia    Hypomagnesemia    Hypothyroidism    Low serum vitamin B12    Lumbar facet  arthropathy    Lumbar foraminal stenosis    Lumbar nerve root compression    Lumbar spondylosis    Lymphedema    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    OA (osteoarthritis)    Obesity    Protrusion of lumbar intervertebral disc    Pulmonary hypertension ([name])    a.) TTE 12/19/2016: RVSP 57.5; b.) TTE 02/10/2017: RVSP 69.9; c.) TTE 07/21/2022: RVSP 40.3   PVC's (premature ventricular contractions)    Sacroiliac joint dysfunction of left side    T2DM (type 2 diabetes mellitus) ([name])    Vitamin D deficiency    Past Surgical History:  Procedure Laterality Date   BREAST BIOPSY Right 06/28/2016   stereotactic biopsy - DUCTAL CARCINOMA IN SITU (DCIS), HIGH NUCLEAR GRADE  (very narrow margins)   BREAST LUMPECTOMY Right 07/18/2016   DCIS   CATARACT EXTRACTION Bilateral    KNEE ARTHROSCOPY Left    MASTECTOMY, PARTIAL Right 07/18/2016   Procedure: MASTECTOMY PARTIAL;  Surgeon: Earline Mayotte, MD;  Location: [facility] ORS;  Service: General;  Laterality: Right;   SKIN CANCER EXCISION     face   TOTAL KNEE ARTHROPLASTY Left    Family History  Problem Relation Age of Onset   Hypertension Mother    Cervical cancer Mother    Heart disease Father    Hypertension Father    Lung cancer Father    Lung cancer Sister    Brain cancer Sister    Diabetes Brother    Heart disease Brother    Hypertension Brother    Stroke Brother    Breast cancer Neg Hx    Social History   Tobacco Use   Smoking status: Never   Smokeless tobacco: Never  Vaping Use   Vaping status: Never Used  Substance Use Topics   Alcohol use: No   Drug use: No    Pertinent Clinical Results:  LABS:   No visits with results within 3 Day(s) from this visit.  Latest known visit with results is:  Hospital Outpatient Visit on 11/22/2022  Component Date Value Ref Range Status   ABO/RH(D) 11/22/2022 A POS   Final   Antibody Screen 11/22/2022 NEG   Final   Sample Expiration 11/22/2022 12/06/2022,2359   Final    Extend sample reason 11/22/2022    Final                   Value:NO TRANSFUSIONS OR PREGNANCY IN THE PAST 3 MONTHS Performed at [facility], [address] [place], [place] [address]    MRSA, PCR 11/22/2022 NEGATIVE  NEGATIVE Final   Staphylococcus aureus 11/22/2022 POSITIVE (A)  NEGATIVE Final   Comment: (NOTE) The Xpert SA Assay (FDA approved for NASAL specimens in patients 8 years of age and older), is one component of a comprehensive surveillance program. It is not intended to diagnose infection nor to guide or monitor treatment. Performed at [facility], [address] [place], [place] [address]    WBC 11/22/2022 6.3  4.0 - 10.5 K/uL Final   RBC 11/22/2022 4.99  3.87 - 5.11 MIL/uL Final   Hemoglobin 11/22/2022 13.1  12.0 - 15.0 g/dL Final   HCT 84/16/6063 41.5  36.0 - 46.0 % Final   MCV 11/22/2022 83.2  80.0 - 100.0 fL Final   MCH 11/22/2022 26.3  26.0 - 34.0 pg Final   MCHC 11/22/2022 31.6  30.0 - 36.0 g/dL Final   RDW 01/60/1093 14.6  11.5 - 15.5 % Final   Platelets 11/22/2022 311  150 - 400 K/uL Final   nRBC 11/22/2022 0.0  0.0 - 0.2 % Final   Performed at [facility], [address] [place], [place] [address]   Sodium 11/22/2022 132 (L)  135 - 145 mmol/L Final   Potassium 11/22/2022 3.7  3.5 - 5.1 mmol/L Final   Chloride 11/22/2022 97 (L)  98 - 111 mmol/L Final   CO2 11/22/2022 26  22 - 32 mmol/L Final   Glucose, Bld 11/22/2022 120 (H)  70 - 99 mg/dL Final   Glucose reference range applies only to samples taken after fasting for at least 8 hours.   BUN 11/22/2022 17  8 - 23 mg/dL Final   Creatinine, Ser 11/22/2022 1.02 (H)  0.44 - 1.00 mg/dL Final   Calcium 32/20/2542 9.3  8.9 - 10.3 mg/dL Final   Total Protein 70/62/3762 7.7  6.5 - 8.1 g/dL Final   Albumin 83/15/1761 3.9  3.5 - 5.0 g/dL Final   AST 60/73/7106 22  15 - 41 U/L Final   ALT 11/22/2022 14  0 - 44 U/L Final   Alkaline Phosphatase 11/22/2022 62  38 - 126 U/L Final   Total  Bilirubin 11/22/2022 1.0  0.3 - 1.2 mg/dL Final   GFR, Estimated 11/22/2022 57 (L)  >60 mL/min Final   Comment: (NOTE) Calculated using the CKD-EPI Creatinine Equation (2021)    Anion gap 11/22/2022 9  5 - 15 Final   Performed at [facility], [address] [place], [place] [address]   Color, Urine 11/22/2022 YELLOW (A)  YELLOW Final   APPearance 11/22/2022 HAZY (A)  CLEAR Final   Specific Gravity, Urine 11/22/2022 1.012  1.005 - 1.030 Final   pH 11/22/2022 6.0  5.0 - 8.0 Final   Glucose, UA 11/22/2022 >=500 (A)  NEGATIVE mg/dL Final   Hgb urine dipstick 11/22/2022 NEGATIVE  NEGATIVE Final   Bilirubin Urine 11/22/2022 NEGATIVE  NEGATIVE Final   Ketones, ur 11/22/2022 NEGATIVE  NEGATIVE mg/dL Final   Protein, ur 54/62/7035 NEGATIVE  NEGATIVE mg/dL Final   Nitrite 00/93/8182 NEGATIVE  NEGATIVE Final   Leukocytes,Ua 11/22/2022 SMALL (A)  NEGATIVE Final   RBC / HPF 11/22/2022 0-5  0 - 5 RBC/hpf Final   WBC, UA 11/22/2022 11-20  0 - 5 WBC/hpf Final   Bacteria, UA 11/22/2022 RARE (A)  NONE SEEN Final   Squamous Epithelial / HPF 11/22/2022 6-10  0 - 5 /HPF Final   Performed at [facility], [address] [place], [place] [address]   CRP 11/22/2022 <0.5  <1.0 mg/dL Final   Performed at [facility], [address] [place], [place] [address]   Sed Rate 11/22/2022 14  0 - 30 mm/hr Final   Performed at [facility], [address] [place], [place] [address]   Hgb A1c MFr Bld 11/22/2022 6.8 (H)  4.8 - 5.6 % Final   Comment: (NOTE) Pre diabetes:          5.7%-6.4%  Diabetes:              >  6.4%  Glycemic control for   <7.0% adults with diabetes    Mean Plasma Glucose 11/22/2022 148.46  mg/dL Final   Performed at [facility], [address] [place], [place] [address]   IgE (Immunoglobulin E), Serum 11/22/2022 12  6 - 495 IU/mL Final   Comment: (NOTE) Performed At: [facility] [address] [place], [place] [id number] Jolene Schimke MD RJ:[id number]     ECG: Date: 11/22/2022 Time ECG obtained: 1026 AM Rate: 56 bpm Rhythm: sinus bradycardia Axis (leads I and aVF): Normal Intervals: PR 190 ms. QRS 88 ms. QTc 405 ms. ST segment and T wave changes: No evidence of acute ST segment elevation or depression.   Comparison: Similar to previous tracing obtained on 07/11/2016   IMAGING / PROCEDURES: DIAGNOSTIC RADIOGRAPHS OF RIGHT KNEE 3 VIEWS performed on 11/24/2022 Patient had difficulty with weightbearing.  There is 100% joint space narrowing on the lateral cartilage space with complete bone-on-bone articulation.   There is resulting valgus alignment appreciated.   There is subchondral sclerosis as well as osteophyte formation off of the lateral tibial plateau and femoral condyle appreciated.   There does not appear to be any fractures, dislocation, lytic lesions or gross deformities appreciated on films.   TRANSTHORACIC ECHOCARDIOGRAM performed on 07/21/2022 Normal left ocular systolic function with an EF of >55% No regional wall motion abnormalities Left ventricular diastolic Doppler parameters consistent with abnormal relaxation (G1DD). Trivial MR Mild TR Mild pulmonary hypertension; RVSP 40.3 mmHg No pericardial effusion  Impression and Plan:  Carol Ramsey has been referred for pre-anesthesia review and clearance prior to her undergoing the planned anesthetic and procedural courses. Available labs, pertinent testing, and imaging results were personally reviewed by me in preparation for upcoming operative/procedural course. [facility] medical record has been updated following extensive record review and patient interview with PAT staff.   This patient has been appropriately cleared by cardiology with an overall MODERATE risk of experiencing significant perioperative cardiovascular complications. Based on clinical review performed today (11/28/22), barring any significant acute changes in the patient's  overall condition, it is anticipated that she will be able to proceed with the planned surgical intervention. Any acute changes in clinical condition may necessitate her procedure being postponed and/or cancelled. Patient will meet with anesthesia team (MD and/or CRNA) on the day of her procedure for preoperative evaluation/assessment. Questions regarding anesthetic course will be fielded at that time.   Pre-surgical instructions were reviewed with the patient during her PAT appointment, and questions were fielded to satisfaction by PAT clinical staff. She has been instructed on which medications that she will need to hold prior to surgery, as well as the ones that have been deemed safe/appropriate to take on the day of her procedure. As part of the general education provided by PAT, patient made aware both verbally and in writing, that she would need to abstain from the use of any illegal substances during her perioperative course.  She was advised that failure to follow the provided instructions could necessitate case cancellation or result in serious perioperative complications up to and including death. Patient encouraged to contact PAT and/or her surgeon's office to discuss any questions or concerns that may arise prior to surgery; verbalized understanding.   Quentin Mulling, MSN, APRN, FNP-C, CEN [facility]  Peri-operative Services Nurse Practitioner Phone: [phone] Fax: [phone] 11/28/22 3:05 PM  NOTE: This note has been prepared using Dragon dictation software. Despite my best ability to proofread, there  is always the potential that unintentional transcriptional errors may still occur from this process.

## 2022-11-29 MED ORDER — CHLORHEXIDINE GLUCONATE 0.12 % MT SOLN
15.0000 mL | Freq: Once | OROMUCOSAL | Status: AC
  Administered 2022-11-30: 15 mL via OROMUCOSAL

## 2022-11-29 MED ORDER — TRANEXAMIC ACID-NACL 1000-0.7 MG/100ML-% IV SOLN
1000.0000 mg | INTRAVENOUS | Status: AC
  Administered 2022-11-30: 1000 mg via INTRAVENOUS

## 2022-11-29 MED ORDER — ORAL CARE MOUTH RINSE: 15.0000 mL | Freq: Once | OROMUCOSAL | Status: AC

## 2022-11-29 MED ORDER — DEXAMETHASONE SODIUM PHOSPHATE 10 MG/ML IJ SOLN
8.0000 mg | Freq: Once | INTRAMUSCULAR | Status: AC
  Administered 2022-11-30: 8 mg via INTRAVENOUS

## 2022-11-29 MED ORDER — CEFAZOLIN SODIUM-DEXTROSE 2-4 GM/100ML-% IV SOLN
2.0000 g | INTRAVENOUS | Status: AC
  Administered 2022-11-30: 2 g via INTRAVENOUS

## 2022-11-29 MED ORDER — CHLORHEXIDINE GLUCONATE 4 % EX SOLN
60.0000 mL | Freq: Once | CUTANEOUS | Status: AC
  Administered 2022-11-30: 4 via TOPICAL

## 2022-11-29 MED ORDER — FAMOTIDINE 20 MG PO TABS
20.0000 mg | ORAL_TABLET | Freq: Once | ORAL | Status: AC
  Administered 2022-11-30: 20 mg via ORAL

## 2022-11-30 ENCOUNTER — Ambulatory Visit: Payer: Medicare Other | Admitting: Urgent Care

## 2022-11-30 ENCOUNTER — Encounter
Admission: RE | Disposition: A | Discharge status: Home Health | Payer: Self-pay | Source: Home / Self Care | Attending: Orthopedic Surgery

## 2022-11-30 ENCOUNTER — Observation Stay: Payer: Medicare Other

## 2022-11-30 ENCOUNTER — Inpatient Hospital Stay
Admission: RE | Admit: 2022-11-30 | Discharge: 2022-12-05 | DRG: 470 | Disposition: A | Discharge status: Home Health | Payer: Medicare Other | Attending: Orthopedic Surgery | Admitting: Orthopedic Surgery

## 2022-11-30 ENCOUNTER — Other Ambulatory Visit: Payer: Self-pay

## 2022-11-30 ENCOUNTER — Encounter: Payer: Self-pay | Admitting: Orthopedic Surgery

## 2022-11-30 DIAGNOSIS — E1165 Type 2 diabetes mellitus with hyperglycemia: Secondary | ICD-10-CM | POA: Diagnosis present

## 2022-11-30 DIAGNOSIS — M1711 Unilateral primary osteoarthritis, right knee: Secondary | ICD-10-CM | POA: Diagnosis not present

## 2022-11-30 DIAGNOSIS — Z01812 Encounter for preprocedural laboratory examination: Secondary | ICD-10-CM

## 2022-11-30 DIAGNOSIS — I129 Hypertensive chronic kidney disease with stage 1 through stage 4 chronic kidney disease, or unspecified chronic kidney disease: Secondary | ICD-10-CM | POA: Diagnosis present

## 2022-11-30 DIAGNOSIS — Z471 Aftercare following joint replacement surgery: Secondary | ICD-10-CM | POA: Diagnosis not present

## 2022-11-30 DIAGNOSIS — E1122 Type 2 diabetes mellitus with diabetic chronic kidney disease: Secondary | ICD-10-CM | POA: Diagnosis not present

## 2022-11-30 DIAGNOSIS — Z79899 Other long term (current) drug therapy: Secondary | ICD-10-CM | POA: Diagnosis not present

## 2022-11-30 DIAGNOSIS — G894 Chronic pain syndrome: Secondary | ICD-10-CM | POA: Diagnosis present

## 2022-11-30 DIAGNOSIS — Z7984 Long term (current) use of oral hypoglycemic drugs: Secondary | ICD-10-CM | POA: Diagnosis not present

## 2022-11-30 DIAGNOSIS — I272 Pulmonary hypertension, unspecified: Secondary | ICD-10-CM | POA: Diagnosis present

## 2022-11-30 DIAGNOSIS — E7800 Pure hypercholesterolemia, unspecified: Secondary | ICD-10-CM | POA: Diagnosis present

## 2022-11-30 DIAGNOSIS — E119 Type 2 diabetes mellitus without complications: Secondary | ICD-10-CM

## 2022-11-30 DIAGNOSIS — Z823 Family history of stroke: Secondary | ICD-10-CM

## 2022-11-30 DIAGNOSIS — Z86000 Personal history of in-situ neoplasm of breast: Secondary | ICD-10-CM | POA: Diagnosis not present

## 2022-11-30 DIAGNOSIS — Z85828 Personal history of other malignant neoplasm of skin: Secondary | ICD-10-CM | POA: Diagnosis not present

## 2022-11-30 DIAGNOSIS — Z794 Long term (current) use of insulin: Secondary | ICD-10-CM | POA: Diagnosis not present

## 2022-11-30 DIAGNOSIS — Z96659 Presence of unspecified artificial knee joint: Secondary | ICD-10-CM

## 2022-11-30 DIAGNOSIS — E559 Vitamin D deficiency, unspecified: Secondary | ICD-10-CM | POA: Diagnosis present

## 2022-11-30 DIAGNOSIS — Z9011 Acquired absence of right breast and nipple: Secondary | ICD-10-CM | POA: Diagnosis not present

## 2022-11-30 DIAGNOSIS — Z8249 Family history of ischemic heart disease and other diseases of the circulatory system: Secondary | ICD-10-CM | POA: Diagnosis not present

## 2022-11-30 DIAGNOSIS — N183 Chronic kidney disease, stage 3 unspecified: Secondary | ICD-10-CM | POA: Diagnosis present

## 2022-11-30 DIAGNOSIS — Z96651 Presence of right artificial knee joint: Secondary | ICD-10-CM | POA: Diagnosis not present

## 2022-11-30 DIAGNOSIS — R609 Edema, unspecified: Secondary | ICD-10-CM | POA: Diagnosis not present

## 2022-11-30 DIAGNOSIS — Z885 Allergy status to narcotic agent status: Secondary | ICD-10-CM | POA: Diagnosis not present

## 2022-11-30 DIAGNOSIS — N1831 Chronic kidney disease, stage 3a: Secondary | ICD-10-CM | POA: Diagnosis not present

## 2022-11-30 DIAGNOSIS — Z8616 Personal history of COVID-19: Secondary | ICD-10-CM

## 2022-11-30 DIAGNOSIS — Z8049 Family history of malignant neoplasm of other genital organs: Secondary | ICD-10-CM

## 2022-11-30 DIAGNOSIS — Z880 Allergy status to penicillin: Secondary | ICD-10-CM

## 2022-11-30 DIAGNOSIS — Z833 Family history of diabetes mellitus: Secondary | ICD-10-CM

## 2022-11-30 DIAGNOSIS — Z801 Family history of malignant neoplasm of trachea, bronchus and lung: Secondary | ICD-10-CM

## 2022-11-30 DIAGNOSIS — R9431 Abnormal electrocardiogram [ECG] [EKG]: Secondary | ICD-10-CM | POA: Diagnosis not present

## 2022-11-30 DIAGNOSIS — E11649 Type 2 diabetes mellitus with hypoglycemia without coma: Secondary | ICD-10-CM | POA: Diagnosis not present

## 2022-11-30 DIAGNOSIS — D631 Anemia in chronic kidney disease: Secondary | ICD-10-CM | POA: Diagnosis not present

## 2022-11-30 DIAGNOSIS — M5136 Other intervertebral disc degeneration, lumbar region: Secondary | ICD-10-CM | POA: Diagnosis present

## 2022-11-30 DIAGNOSIS — R001 Bradycardia, unspecified: Secondary | ICD-10-CM | POA: Diagnosis not present

## 2022-11-30 DIAGNOSIS — Z96652 Presence of left artificial knee joint: Secondary | ICD-10-CM | POA: Diagnosis present

## 2022-11-30 DIAGNOSIS — E538 Deficiency of other specified B group vitamins: Secondary | ICD-10-CM

## 2022-11-30 DIAGNOSIS — E8809 Other disorders of plasma-protein metabolism, not elsewhere classified: Secondary | ICD-10-CM

## 2022-11-30 DIAGNOSIS — Z882 Allergy status to sulfonamides status: Secondary | ICD-10-CM

## 2022-11-30 DIAGNOSIS — D559 Anemia due to enzyme disorder, unspecified: Secondary | ICD-10-CM

## 2022-11-30 DIAGNOSIS — E039 Hypothyroidism, unspecified: Secondary | ICD-10-CM | POA: Diagnosis present

## 2022-11-30 DIAGNOSIS — Z888 Allergy status to other drugs, medicaments and biological substances status: Secondary | ICD-10-CM | POA: Diagnosis not present

## 2022-11-30 DIAGNOSIS — Z808 Family history of malignant neoplasm of other organs or systems: Secondary | ICD-10-CM

## 2022-11-30 HISTORY — PX: KNEE ARTHROPLASTY: SHX992

## 2022-11-30 HISTORY — DX: Unspecified osteoarthritis, unspecified site: M19.90

## 2022-11-30 HISTORY — DX: Other ill-defined heart diseases: I51.89

## 2022-11-30 HISTORY — DX: Other forms of dyspnea: R06.09

## 2022-11-30 HISTORY — DX: Cataract extraction status, right eye: Z98.41

## 2022-11-30 HISTORY — DX: Ventricular premature depolarization: I49.3

## 2022-11-30 HISTORY — DX: Basal cell carcinoma of skin of unspecified lower limb, including hip: C44.711

## 2022-11-30 HISTORY — DX: Type 2 diabetes mellitus without complications: E11.9

## 2022-11-30 LAB — POCT I-STAT, CHEM 8
BUN: 14 mg/dL (ref 8–23)
Calcium, Ion: 1.15 mmol/L (ref 1.15–1.40)
Chloride: 98 mmol/L (ref 98–111)
Creatinine, Ser: 1.2 mg/dL — ABNORMAL HIGH (ref 0.44–1.00)
Glucose, Bld: 156 mg/dL — ABNORMAL HIGH (ref 70–99)
HCT: 38 % (ref 36.0–46.0)
Hemoglobin: 12.9 g/dL (ref 12.0–15.0)
Potassium: 3.7 mmol/L (ref 3.5–5.1)
Sodium: 133 mmol/L — ABNORMAL LOW (ref 135–145)
TCO2: 25 mmol/L (ref 22–32)

## 2022-11-30 LAB — GLUCOSE, CAPILLARY
Glucose-Capillary: 143 mg/dL — ABNORMAL HIGH (ref 70–99)
Glucose-Capillary: 241 mg/dL — ABNORMAL HIGH (ref 70–99)

## 2022-11-30 SURGERY — ARTHROPLASTY, KNEE, TOTAL, USING IMAGELESS COMPUTER-ASSISTED NAVIGATION
Anesthesia: Spinal | Site: Knee | Laterality: Right

## 2022-11-30 MED ORDER — LISINOPRIL 20 MG PO TABS
20.0000 mg | ORAL_TABLET | Freq: Every day | ORAL | Status: DC
  Administered 2022-12-01 – 2022-12-05 (×5): 20 mg via ORAL
  Filled 2022-11-30 (×6): qty 1

## 2022-11-30 MED ORDER — SODIUM CHLORIDE 0.9 % IV SOLN: INTRAVENOUS | Status: DC

## 2022-11-30 MED ORDER — METOCLOPRAMIDE HCL 5 MG PO TABS
10.0000 mg | ORAL_TABLET | Freq: Three times a day (TID) | ORAL | Status: AC
  Administered 2022-11-30 – 2022-12-02 (×8): 10 mg via ORAL
  Filled 2022-11-30 (×8): qty 2

## 2022-11-30 MED ORDER — PROPOFOL 10 MG/ML IV BOLUS: INTRAVENOUS | Status: DC | PRN

## 2022-11-30 MED ORDER — TRANEXAMIC ACID-NACL 1000-0.7 MG/100ML-% IV SOLN
INTRAVENOUS | Status: AC
  Filled 2022-11-30: qty 100

## 2022-11-30 MED ORDER — OXYCODONE HCL 5 MG PO TABS
5.0000 mg | ORAL_TABLET | ORAL | Status: DC | PRN
  Administered 2022-11-30 – 2022-12-05 (×4): 5 mg via ORAL
  Filled 2022-11-30 (×4): qty 1

## 2022-11-30 MED ORDER — ALUM & MAG HYDROXIDE-SIMETH 200-200-20 MG/5ML PO SUSP: 30.0000 mL | ORAL | Status: DC | PRN

## 2022-11-30 MED ORDER — FENTANYL CITRATE (PF) 100 MCG/2ML IJ SOLN
INTRAMUSCULAR | Status: DC | PRN
  Administered 2022-11-30: 25 ug via INTRAVENOUS

## 2022-11-30 MED ORDER — SODIUM CHLORIDE 0.9 % IR SOLN
Status: DC | PRN
  Administered 2022-11-30: 3000 mL

## 2022-11-30 MED ORDER — PROPOFOL 1000 MG/100ML IV EMUL
INTRAVENOUS | Status: AC
  Filled 2022-11-30: qty 100

## 2022-11-30 MED ORDER — FENTANYL CITRATE (PF) 100 MCG/2ML IJ SOLN
25.0000 ug | INTRAMUSCULAR | Status: AC | PRN
  Administered 2022-11-30 (×6): 25 ug via INTRAVENOUS

## 2022-11-30 MED ORDER — SENNOSIDES-DOCUSATE SODIUM 8.6-50 MG PO TABS
1.0000 | ORAL_TABLET | Freq: Two times a day (BID) | ORAL | Status: DC
  Administered 2022-11-30 – 2022-12-05 (×9): 1 via ORAL
  Filled 2022-11-30 (×9): qty 1

## 2022-11-30 MED ORDER — SEMAGLUTIDE 7 MG PO TABS: 1.0000 | ORAL_TABLET | Freq: Every day | ORAL | Status: DC

## 2022-11-30 MED ORDER — CEFAZOLIN SODIUM-DEXTROSE 2-4 GM/100ML-% IV SOLN
2.0000 g | Freq: Four times a day (QID) | INTRAVENOUS | Status: AC
  Administered 2022-11-30 – 2022-12-01 (×2): 2 g via INTRAVENOUS
  Filled 2022-11-30 (×2): qty 100

## 2022-11-30 MED ORDER — CHLORHEXIDINE GLUCONATE 0.12 % MT SOLN
OROMUCOSAL | Status: AC
  Filled 2022-11-30: qty 15

## 2022-11-30 MED ORDER — FUROSEMIDE 20 MG PO TABS: 20.0000 mg | ORAL_TABLET | ORAL | Status: DC | PRN

## 2022-11-30 MED ORDER — BUPIVACAINE HCL (PF) 0.5 % IJ SOLN
INTRAMUSCULAR | Status: AC
  Filled 2022-11-30: qty 10

## 2022-11-30 MED ORDER — FENTANYL CITRATE (PF) 100 MCG/2ML IJ SOLN
INTRAMUSCULAR | Status: AC
  Filled 2022-11-30: qty 2

## 2022-11-30 MED ORDER — FERROUS SULFATE 325 (65 FE) MG PO TABS
325.0000 mg | ORAL_TABLET | Freq: Two times a day (BID) | ORAL | Status: DC
  Administered 2022-11-30 – 2022-12-05 (×10): 325 mg via ORAL
  Filled 2022-11-30 (×10): qty 1

## 2022-11-30 MED ORDER — OXYCODONE HCL 5 MG/5ML PO SOLN: 5.0000 mg | Freq: Once | ORAL | Status: AC | PRN

## 2022-11-30 MED ORDER — MAGNESIUM HYDROXIDE 400 MG/5ML PO SUSP
30.0000 mL | Freq: Every day | ORAL | Status: DC
  Administered 2022-11-30 – 2022-12-05 (×5): 30 mL via ORAL
  Filled 2022-11-30 (×6): qty 30

## 2022-11-30 MED ORDER — METOPROLOL SUCCINATE ER 25 MG PO TB24
25.0000 mg | ORAL_TABLET | Freq: Every day | ORAL | Status: DC
  Administered 2022-12-02 – 2022-12-05 (×4): 25 mg via ORAL
  Filled 2022-11-30 (×4): qty 1

## 2022-11-30 MED ORDER — SODIUM CHLORIDE (PF) 0.9 % IJ SOLN
INTRAMUSCULAR | Status: DC | PRN
  Administered 2022-11-30: 120 mL via INTRAMUSCULAR

## 2022-11-30 MED ORDER — GLIPIZIDE ER 10 MG PO TB24
10.0000 mg | ORAL_TABLET | Freq: Every day | ORAL | Status: DC
  Administered 2022-12-01 – 2022-12-04 (×4): 10 mg via ORAL
  Filled 2022-11-30 (×5): qty 1

## 2022-11-30 MED ORDER — DAPAGLIFLOZIN PROPANEDIOL 10 MG PO TABS
10.0000 mg | ORAL_TABLET | Freq: Every day | ORAL | Status: DC
  Administered 2022-12-01 – 2022-12-05 (×5): 10 mg via ORAL
  Filled 2022-11-30 (×5): qty 1

## 2022-11-30 MED ORDER — ACETAMINOPHEN 325 MG PO TABS
325.0000 mg | ORAL_TABLET | Freq: Four times a day (QID) | ORAL | Status: DC | PRN
  Administered 2022-12-01 – 2022-12-02 (×2): 650 mg via ORAL
  Administered 2022-12-03: 325 mg via ORAL
  Filled 2022-11-30: qty 2
  Filled 2022-11-30: qty 1
  Filled 2022-11-30 (×2): qty 2

## 2022-11-30 MED ORDER — DEXAMETHASONE SODIUM PHOSPHATE 10 MG/ML IJ SOLN
INTRAMUSCULAR | Status: AC
  Filled 2022-11-30: qty 1

## 2022-11-30 MED ORDER — PHENOL 1.4 % MT LIQD: 1.0000 | OROMUCOSAL | Status: DC | PRN

## 2022-11-30 MED ORDER — MENTHOL 3 MG MT LOZG
1.0000 | LOZENGE | OROMUCOSAL | Status: DC | PRN
  Administered 2022-12-03 – 2022-12-04 (×2): 3 mg via ORAL
  Filled 2022-11-30: qty 9

## 2022-11-30 MED ORDER — ACETAMINOPHEN 10 MG/ML IV SOLN
INTRAVENOUS | Status: DC | PRN
  Administered 2022-11-30: 1000 mg via INTRAVENOUS

## 2022-11-30 MED ORDER — OXYCODONE HCL 5 MG PO TABS
5.0000 mg | ORAL_TABLET | Freq: Once | ORAL | Status: AC | PRN
  Administered 2022-11-30: 5 mg via ORAL

## 2022-11-30 MED ORDER — SODIUM CHLORIDE FLUSH 0.9 % IV SOLN
INTRAVENOUS | Status: AC
  Filled 2022-11-30: qty 80

## 2022-11-30 MED ORDER — INSULIN GLARGINE-YFGN 100 UNIT/ML ~~LOC~~ SOLN
5.0000 [IU] | Freq: Every day | SUBCUTANEOUS | Status: DC
  Administered 2022-11-30 – 2022-12-03 (×4): 5 [IU] via SUBCUTANEOUS
  Filled 2022-11-30 (×6): qty 0.05

## 2022-11-30 MED ORDER — DIPHENHYDRAMINE HCL 12.5 MG/5ML PO ELIX: 12.5000 mg | ORAL_SOLUTION | ORAL | Status: DC | PRN

## 2022-11-30 MED ORDER — SURGIPHOR WOUND IRRIGATION SYSTEM - OPTIME: TOPICAL | Status: DC | PRN

## 2022-11-30 MED ORDER — CEFAZOLIN SODIUM-DEXTROSE 2-4 GM/100ML-% IV SOLN
INTRAVENOUS | Status: AC
  Filled 2022-11-30: qty 100

## 2022-11-30 MED ORDER — TRANEXAMIC ACID-NACL 1000-0.7 MG/100ML-% IV SOLN
1000.0000 mg | Freq: Once | INTRAVENOUS | Status: AC
  Administered 2022-11-30: 1000 mg via INTRAVENOUS

## 2022-11-30 MED ORDER — MIDAZOLAM HCL 2 MG/2ML IJ SOLN
INTRAMUSCULAR | Status: AC
  Filled 2022-11-30: qty 2

## 2022-11-30 MED ORDER — BUPIVACAINE HCL (PF) 0.25 % IJ SOLN
INTRAMUSCULAR | Status: AC
  Filled 2022-11-30: qty 120

## 2022-11-30 MED ORDER — PROPOFOL 500 MG/50ML IV EMUL
INTRAVENOUS | Status: DC | PRN
  Administered 2022-11-30: 70 ug/kg/min via INTRAVENOUS

## 2022-11-30 MED ORDER — INSULIN ASPART 100 UNIT/ML IJ SOLN
0.0000 [IU] | Freq: Three times a day (TID) | INTRAMUSCULAR | Status: DC
  Administered 2022-12-01: 3 [IU] via SUBCUTANEOUS
  Administered 2022-12-01: 2 [IU] via SUBCUTANEOUS
  Administered 2022-12-01 – 2022-12-03 (×4): 3 [IU] via SUBCUTANEOUS
  Administered 2022-12-04: 5 [IU] via SUBCUTANEOUS
  Administered 2022-12-05: 3 [IU] via SUBCUTANEOUS
  Administered 2022-12-05: 2 [IU] via SUBCUTANEOUS
  Filled 2022-11-30 (×9): qty 1

## 2022-11-30 MED ORDER — ATORVASTATIN CALCIUM 10 MG PO TABS
10.0000 mg | ORAL_TABLET | Freq: Every day | ORAL | Status: DC
  Administered 2022-11-30 – 2022-12-04 (×5): 10 mg via ORAL
  Filled 2022-11-30 (×5): qty 1

## 2022-11-30 MED ORDER — INSULIN ASPART 100 UNIT/ML IJ SOLN
0.0000 [IU] | Freq: Every day | INTRAMUSCULAR | Status: DC
  Administered 2022-11-30: 2 [IU] via SUBCUTANEOUS
  Filled 2022-11-30: qty 1

## 2022-11-30 MED ORDER — BISACODYL 10 MG RE SUPP: 10.0000 mg | Freq: Every day | RECTAL | Status: DC | PRN

## 2022-11-30 MED ORDER — ACETAMINOPHEN 10 MG/ML IV SOLN
1000.0000 mg | Freq: Four times a day (QID) | INTRAVENOUS | Status: AC
  Administered 2022-11-30 – 2022-12-01 (×4): 1000 mg via INTRAVENOUS
  Filled 2022-11-30 (×4): qty 100

## 2022-11-30 MED ORDER — ONDANSETRON HCL 4 MG PO TABS: 4.0000 mg | ORAL_TABLET | Freq: Four times a day (QID) | ORAL | Status: DC | PRN

## 2022-11-30 MED ORDER — ASPIRIN 81 MG PO CHEW
81.0000 mg | CHEWABLE_TABLET | Freq: Two times a day (BID) | ORAL | Status: DC
  Administered 2022-11-30 – 2022-12-05 (×10): 81 mg via ORAL
  Filled 2022-11-30 (×10): qty 1

## 2022-11-30 MED ORDER — ACETAMINOPHEN 10 MG/ML IV SOLN
INTRAVENOUS | Status: AC
  Filled 2022-11-30: qty 100

## 2022-11-30 MED ORDER — ACETAMINOPHEN 10 MG/ML IV SOLN: 1000.0000 mg | Freq: Once | INTRAVENOUS | Status: DC | PRN

## 2022-11-30 MED ORDER — PHENYLEPHRINE HCL (PRESSORS) 10 MG/ML IV SOLN
INTRAVENOUS | Status: DC | PRN
  Administered 2022-11-30 (×3): 40 ug via INTRAVENOUS

## 2022-11-30 MED ORDER — TRAMADOL HCL 50 MG PO TABS
50.0000 mg | ORAL_TABLET | ORAL | Status: DC | PRN
  Administered 2022-11-30 – 2022-12-02 (×2): 100 mg via ORAL
  Administered 2022-12-02: 50 mg via ORAL
  Filled 2022-11-30: qty 2
  Filled 2022-11-30: qty 1
  Filled 2022-11-30 (×2): qty 2

## 2022-11-30 MED ORDER — MIDAZOLAM HCL 5 MG/5ML IJ SOLN
INTRAMUSCULAR | Status: DC | PRN
  Administered 2022-11-30 (×2): 1 mg via INTRAVENOUS

## 2022-11-30 MED ORDER — FAMOTIDINE 20 MG PO TABS
ORAL_TABLET | ORAL | Status: AC
  Filled 2022-11-30: qty 1

## 2022-11-30 MED ORDER — PANTOPRAZOLE SODIUM 40 MG PO TBEC
40.0000 mg | DELAYED_RELEASE_TABLET | Freq: Two times a day (BID) | ORAL | Status: DC
  Administered 2022-11-30 – 2022-12-05 (×10): 40 mg via ORAL
  Filled 2022-11-30 (×10): qty 1

## 2022-11-30 MED ORDER — FLEET ENEMA 7-19 GM/118ML RE ENEM: 1.0000 | ENEMA | Freq: Once | RECTAL | Status: DC | PRN

## 2022-11-30 MED ORDER — OXYCODONE HCL 5 MG PO TABS
10.0000 mg | ORAL_TABLET | ORAL | Status: DC | PRN
  Administered 2022-12-03: 10 mg via ORAL
  Filled 2022-11-30 (×2): qty 2

## 2022-11-30 MED ORDER — BUPIVACAINE HCL (PF) 0.5 % IJ SOLN
INTRAMUSCULAR | Status: DC | PRN
  Administered 2022-11-30: 3 mL

## 2022-11-30 MED ORDER — OXYCODONE HCL 5 MG PO TABS
ORAL_TABLET | ORAL | Status: AC
  Filled 2022-11-30: qty 1

## 2022-11-30 MED ORDER — BUPIVACAINE LIPOSOME 1.3 % IJ SUSP
INTRAMUSCULAR | Status: AC
  Filled 2022-11-30: qty 40

## 2022-11-30 MED ORDER — ONDANSETRON HCL 4 MG/2ML IJ SOLN: 4.0000 mg | Freq: Once | INTRAMUSCULAR | Status: DC | PRN

## 2022-11-30 MED ORDER — ONDANSETRON HCL 4 MG/2ML IJ SOLN
4.0000 mg | Freq: Four times a day (QID) | INTRAMUSCULAR | Status: DC | PRN
  Administered 2022-11-30 – 2022-12-01 (×2): 4 mg via INTRAVENOUS
  Filled 2022-11-30 (×2): qty 2

## 2022-11-30 MED ORDER — HYDROMORPHONE HCL 1 MG/ML IJ SOLN
0.5000 mg | INTRAMUSCULAR | Status: DC | PRN
  Administered 2022-11-30: 1 mg via INTRAVENOUS
  Filled 2022-11-30: qty 1

## 2022-11-30 SURGICAL SUPPLY — 78 items
ATTUNE PS FEM RT SZ 5 CEM KNEE (Femur) IMPLANT
ATTUNE PSRP INSE SZ5 7 KNEE (Insert) IMPLANT
BASE TIBIAL ROT PLAT SZ 5 KNEE (Knees) IMPLANT
BATTERY INSTRU NAVIGATION (MISCELLANEOUS) ×8 IMPLANT
BLADE SAW 70X12.5 (BLADE) ×2 IMPLANT
BLADE SAW 90X13X1.19 OSCILLAT (BLADE) ×2 IMPLANT
BLADE SAW 90X25X1.19 OSCILLAT (BLADE) ×2 IMPLANT
BONE CEMENT GENTAMICIN (Cement) ×2 IMPLANT
BRUSH SCRUB EZ PLAIN DRY (MISCELLANEOUS) ×2 IMPLANT
BSPLAT TIB 5 CMNT ROT PLAT STR (Knees) ×1 IMPLANT
BTRY SRG DRVR LF (MISCELLANEOUS) ×4
CEMENT BONE GENTAMICIN 40 (Cement) IMPLANT
CEMENT HV SMART SET (Cement) IMPLANT
COOLER POLAR GLACIER W/PUMP (MISCELLANEOUS) ×2 IMPLANT
CUFF TOURN SGL QUICK 24 (TOURNIQUET CUFF) ×1
CUFF TOURN SGL QUICK 30 (TOURNIQUET CUFF)
CUFF TRNQT CYL 24X4X16.5-23 (TOURNIQUET CUFF) IMPLANT
CUFF TRNQT CYL 30X4X21-28X (TOURNIQUET CUFF) IMPLANT
DRAPE 3/4 80X56 (DRAPES) ×2 IMPLANT
DRAPE INCISE IOBAN 66X45 STRL (DRAPES) IMPLANT
DRSG AQUACEL AG ADV 3.5X14 (GAUZE/BANDAGES/DRESSINGS) ×2 IMPLANT
DRSG DERMACEA NONADH 3X8 (GAUZE/BANDAGES/DRESSINGS) ×2 IMPLANT
DRSG MEPILEX SACRM 8.7X9.8 (GAUZE/BANDAGES/DRESSINGS) ×2 IMPLANT
DRSG TEGADERM 4X4.75 (GAUZE/BANDAGES/DRESSINGS) ×2 IMPLANT
DURAPREP 26ML APPLICATOR (WOUND CARE) ×4 IMPLANT
ELECT CAUTERY BLADE 6.4 (BLADE) ×2 IMPLANT
ELECT REM PT RETURN 9FT ADLT (ELECTROSURGICAL) ×1
ELECTRODE REM PT RTRN 9FT ADLT (ELECTROSURGICAL) ×2 IMPLANT
EX-PIN ORTHOLOCK NAV 4X150 (PIN) ×4 IMPLANT
GLOVE BIOGEL M STRL SZ7.5 (GLOVE) ×8 IMPLANT
GLOVE SRG 8 PF TXTR STRL LF DI (GLOVE) ×4 IMPLANT
GLOVE SURG UNDER POLY LF SZ8 (GLOVE) ×2
GOWN STRL REUS W/ TWL LRG LVL3 (GOWN DISPOSABLE) ×2 IMPLANT
GOWN STRL REUS W/ TWL XL LVL3 (GOWN DISPOSABLE) ×2 IMPLANT
GOWN STRL REUS W/TWL LRG LVL3 (GOWN DISPOSABLE) ×1
GOWN STRL REUS W/TWL XL LVL3 (GOWN DISPOSABLE) ×1
GOWN TOGA ZIPPER T7+ PEEL AWAY (MISCELLANEOUS) ×2 IMPLANT
HANDLE YANKAUER SUCT OPEN TIP (MISCELLANEOUS) ×2 IMPLANT
HEMOVAC 400CC 10FR (MISCELLANEOUS) ×2 IMPLANT
HOLDER FOLEY CATH W/STRAP (MISCELLANEOUS) ×2 IMPLANT
HOOD PEEL AWAY T7 (MISCELLANEOUS) ×2 IMPLANT
IV NS IRRIG 3000ML ARTHROMATIC (IV SOLUTION) ×2 IMPLANT
KIT TURNOVER KIT A (KITS) ×2 IMPLANT
KNIFE SCULPS 14X20 (INSTRUMENTS) ×2 IMPLANT
MANIFOLD NEPTUNE II (INSTRUMENTS) ×4 IMPLANT
NDL SPNL 20GX3.5 QUINCKE YW (NEEDLE) ×4 IMPLANT
NEEDLE SPNL 20GX3.5 QUINCKE YW (NEEDLE) ×2 IMPLANT
PACK TOTAL KNEE (MISCELLANEOUS) ×2 IMPLANT
PAD ABD DERMACEA PRESS 5X9 (GAUZE/BANDAGES/DRESSINGS) ×4 IMPLANT
PAD ARMBOARD 7.5X6 YLW CONV (MISCELLANEOUS) ×6 IMPLANT
PAD WRAPON POLAR KNEE (MISCELLANEOUS) ×2 IMPLANT
PATELLA MEDIAL ATTUN 35MM KNEE (Knees) IMPLANT
PENCIL SMOKE EVACUATOR COATED (MISCELLANEOUS) ×2 IMPLANT
PIN DRILL FIX HALF THREAD (BIT) ×4 IMPLANT
PIN DRILL QUICK PACK (PIN) ×4 IMPLANT
PIN FIXATION 1/8DIA X 3INL (PIN) ×2 IMPLANT
PULSAVAC PLUS IRRIG FAN TIP (DISPOSABLE) ×1
SOL PREP PVP 2OZ (MISCELLANEOUS) ×1
SOLUTION IRRIG SURGIPHOR (IV SOLUTION) ×2 IMPLANT
SOLUTION PREP PVP 2OZ (MISCELLANEOUS) ×2 IMPLANT
SPONGE DRAIN TRACH 4X4 STRL 2S (GAUZE/BANDAGES/DRESSINGS) ×2 IMPLANT
STAPLER SKIN PROX 35W (STAPLE) ×2 IMPLANT
STOCKINETTE IMPERV 14X48 (MISCELLANEOUS) ×2 IMPLANT
STRAP TIBIA SHORT (MISCELLANEOUS) ×2 IMPLANT
SUCTION TUBE FRAZIER 10FR DISP (SUCTIONS) ×2 IMPLANT
SUT VIC AB 0 CT1 36 (SUTURE) ×2 IMPLANT
SUT VIC AB 1 CT1 36 (SUTURE) ×4 IMPLANT
SUT VIC AB 2-0 CT2 27 (SUTURE) ×2 IMPLANT
SYR 30ML LL (SYRINGE) ×4 IMPLANT
TIBIAL BASE ROT PLAT SZ 5 KNEE (Knees) ×1 IMPLANT
TIP FAN IRRIG PULSAVAC PLUS (DISPOSABLE) ×2 IMPLANT
TOWEL OR 17X26 4PK STRL BLUE (TOWEL DISPOSABLE) IMPLANT
TOWER CARTRIDGE SMART MIX (DISPOSABLE) ×2 IMPLANT
TRAP FLUID SMOKE EVACUATOR (MISCELLANEOUS) ×2 IMPLANT
TRAY FOLEY MTR SLVR 16FR STAT (SET/KITS/TRAYS/PACK) ×2 IMPLANT
TUBING CONNECTING 10 (TUBING) ×4 IMPLANT
WATER STERILE IRR 1000ML POUR (IV SOLUTION) ×2 IMPLANT
WRAPON POLAR PAD KNEE (MISCELLANEOUS) ×1

## 2022-11-30 NOTE — Interval H&P Note (Signed)
History and Physical Interval Note:  11/30/2022 6:18 AM  Carol Ramsey  has presented today for surgery, with the diagnosis of PRIMARY OSTEOARTHRITIS OF RIGHT KNEE..  The various methods of treatment have been discussed with the patient and family. After consideration of risks, benefits and other options for treatment, the patient has consented to  Procedure(s): COMPUTER ASSISTED TOTAL KNEE ARTHROPLASTY (Right) as a surgical intervention.  The patient's history has been reviewed, patient examined, no change in status, stable for surgery.  I have reviewed the patient's chart and labs.  Questions were answered to the patient's satisfaction.     Maximillian Habibi P [name]

## 2022-11-30 NOTE — Progress Notes (Signed)
Post-op sinus bradycardia, HR in the low 40s. Patient asymptomatic, denies dizziness, lightheadedness, chest pain, SOB. Patient sleepy. EKG unremarkable other than sinus bradycardia. OK to transfer to floor, low concern for any issues.

## 2022-11-30 NOTE — Op Note (Signed)
OPERATIVE NOTE  DATE OF SURGERY:  11/30/2022  PATIENT NAME:  Carol Ramsey   DOB: 08-26-1945  MRN: [id number]  PRE-OPERATIVE DIAGNOSIS: Degenerative arthrosis of the right knee, primary  POST-OPERATIVE DIAGNOSIS:  Same  PROCEDURE:  Right total knee arthroplasty using computer-assisted navigation  SURGEON:  Jena Gauss. M.D.  ASSISTANT:  Gean Birchwood, PA-C (present and scrubbed throughout the case, critical for assistance with exposure, retraction, instrumentation, and closure)  ANESTHESIA: spinal  ESTIMATED BLOOD LOSS: 50 mL  FLUIDS REPLACED: 1200 mL of crystalloid  TOURNIQUET TIME: 96 minutes  DRAINS: 2 medium Hemovac drains  SOFT TISSUE RELEASES: Anterior cruciate ligament, posterior cruciate ligament, deep medial collateral ligament, patellofemoral ligament, posterolateral corner, and pie crusting of the IT band  IMPLANTS UTILIZED: DePuy Attune size 5 posterior stabilized femoral component (cemented), size 5 rotating platform tibial component (cemented), 35 mm medialized dome patella (cemented), and a 7 mm stabilized rotating platform polyethylene insert.  INDICATIONS FOR SURGERY: Carol Ramsey is a 77 y.o. year old female with a long history of progressive knee pain. X-rays demonstrated severe degenerative changes in tricompartmental fashion. The patient had not seen any significant improvement despite conservative nonsurgical intervention. After discussion of the risks and benefits of surgical intervention, the patient expressed understanding of the risks benefits and agree with plans for total knee arthroplasty.   The risks, benefits, and alternatives were discussed at length including but not limited to the risks of infection, bleeding, nerve injury, stiffness, blood clots, the need for revision surgery, cardiopulmonary complications, among others, and they were willing to proceed.  PROCEDURE IN DETAIL: The patient was brought into the operating room and, after  adequate spinal anesthesia was achieved, a tourniquet was placed on the patient's upper thigh. The patient's knee and leg were cleaned and prepped with alcohol and DuraPrep and draped in the usual sterile fashion. A "timeout" was performed as per usual protocol. The lower extremity was exsanguinated using an Esmarch, and the tourniquet was inflated to 300 mmHg. An anterior longitudinal incision was made followed by a standard mid vastus approach. The deep fibers of the medial collateral ligament were elevated in a subperiosteal fashion off of the medial flare of the tibia so as to maintain a continuous soft tissue sleeve. The patella was subluxed laterally and the patellofemoral ligament was incised. Inspection of the knee demonstrated severe degenerative changes with full-thickness loss of articular cartilage. Osteophytes were debrided using a rongeur. Anterior and posterior cruciate ligaments were excised. Two 4.0 mm Schanz pins were inserted in the femur and into the tibia for attachment of the array of trackers used for computer-assisted navigation. Hip center was identified using a circumduction technique. Distal landmarks were mapped using the computer. The distal femur and proximal tibia were mapped using the computer. The distal femoral cutting guide was positioned using computer-assisted navigation so as to achieve a 5 distal valgus cut. The femur was sized and it was felt that a size 5 femoral component was appropriate. A size 5 femoral cutting guide was positioned and the anterior cut was performed and verified using the computer. This was followed by completion of the posterior and chamfer cuts. Femoral cutting guide for the central box was then positioned in the center box cut was performed.  Attention was then directed to the proximal tibia. Medial and lateral menisci were excised. The extramedullary tibial cutting guide was positioned using computer-assisted navigation so as to achieve a 0  varus-valgus alignment and 3 posterior slope. The cut was  performed and verified using the computer. The proximal tibia was sized and it was felt that a size 5 tibial tray was appropriate. Tibial and femoral trials were inserted followed by insertion of a 5 mm polyethylene insert. The knee was felt to be tight laterally.  The trial components were removed and the knee was brought into full extension and distracted using the Moreland retractors.  The posterolateral corner was carefully released using a combination of electrocautery and Metzenbaum scissors.  The IT band was still felt to be extremely tight and it was elected to piecrust the IT band so as to allow for appropriate lengthening.  Trial components were reinserted followed by placement of a 7 mm polyethylene trial.  This allowed for excellent mediolateral soft tissue balancing both in flexion and in full extension. Finally, the patella was cut and prepared so as to accommodate a 35 mm medialized dome patella. A patella trial was placed and the knee was placed through a range of motion with excellent patellar tracking appreciated. The femoral trial was removed after debridement of posterior osteophytes. The central post-hole for the tibial component was reamed followed by insertion of a keel punch. Tibial trials were then removed. Cut surfaces of bone were irrigated with copious amounts of normal saline using pulsatile lavage and then suctioned dry. Polymethylmethacrylate cement with gentamicin was prepared in the usual fashion using a vacuum mixer. Cement was applied to the cut surface of the proximal tibia as well as along the undersurface of a size 5 rotating platform tibial component. Tibial component was positioned and impacted into place. Excess cement was removed using Personal assistant. Cement was then applied to the cut surfaces of the femur as well as along the posterior flanges of the size 5 femoral component. The femoral component was positioned  and impacted into place. Excess cement was removed using Personal assistant. A 7 mm polyethylene trial was inserted and the knee was brought into full extension with steady axial compression applied. Finally, cement was applied to the backside of a 35 mm medialized dome patella and the patellar component was positioned and patellar clamp applied. Excess cement was removed using Personal assistant. After adequate curing of the cement, the tourniquet was deflated after a total tourniquet time of 96 minutes. Hemostasis was achieved using electrocautery. The knee was irrigated with copious amounts of normal saline using pulsatile lavage followed by 450 ml of Surgiphor and then suctioned dry. 20 mL of 1.3% Exparel and 60 mL of 0.25% Marcaine in 40 mL of normal saline was injected along the posterior capsule, medial and lateral gutters, and along the arthrotomy site. A 7 mm stabilized rotating platform polyethylene insert was inserted and the knee was placed through a range of motion with excellent mediolateral soft tissue balancing appreciated and excellent patellar tracking noted. 2 medium drains were placed in the wound bed and brought out through separate stab incisions. The medial parapatellar portion of the incision was reapproximated using interrupted sutures of #1 Vicryl. Subcutaneous tissue was approximated in layers using first #0 Vicryl followed #2-0 Vicryl. The skin was approximated with skin staples. A sterile dressing was applied.  The patient tolerated the procedure well and was transported to the recovery room in stable condition.    Vaishnav Demartin P. Angie Fava., M.D.

## 2022-11-30 NOTE — H&P (Signed)
ORTHOPAEDIC HISTORY & PHYSICAL Raenette Rover, [place] - 11/24/2022 1:45 PM EDT Formatting of this note is different from the original. NAME: Carol Ramsey H&P Date: 11/24/2022 Procedure Date: 11/30/2022  Chief Complaint: right knee pain, instability, swelling, locking, and giving way  HPI Carol Ramsey is a 77 y.o. female who has severe knee pain and has failed conservative treatment including ambulatory aids, Tylenol and activity modification. She localizes her pain to the lateral aspect of her knee, does report having some swelling some locking and significant feelings of the knee giving away. Pain is aggravated with any weightbearing process or activity. It has been affecting her ability to ambulate long distances and perform her daily activities. She is currently utilizing a wheelchair or cane. She has requested operative intervention for relief of her DJD symptoms. Patient does have a known history of diabetes, her last A1c was 6.8. Patient denies any history of DVTs or clots. Denies any cardiac or pulmonary issues. Denies having any issues with prior surgeries or anesthesia.  Social Hx: Patient denies any smoking, alcohol or drug use. She is currently retired.  Medications & Allergies Allergies: Allergies Allergen Reactions Amoxicillin Rash and Itching Has patient had a PCN reaction causing immediate rash, facial/tongue/throat swelling, SOB or lightheadedness with hypotension: No Has patient had a PCN reaction causing severe rash involving mucus membranes or skin necrosis: No Has patient had a PCN reaction that required hospitalization No Has patient had a PCN reaction occurring within the last 10 years: No If all of the above answers are "NO", then may proceed with Cephalosporin use. Dilaudid [Hydromorphone] Rash Etodolac Rash Gabapentin Swelling Hydrochlorothiazide Rash Hydrocodone Rash Hydromorphone Hcl Rash Quinine Sulfate Nausea and Vomiting Sulfa (Sulfonamide Antibiotics)  Rash  Home Medicines: Current Outpatient Medications on File Prior to Visit Medication Sig Dispense Refill acetaminophen (TYLENOL) 500 MG tablet Take 500 mg by mouth once as needed for Pain ascorbic acid, vitamin C, (VITAMIN C) 500 MG tablet Take 500 mg by mouth once daily atorvastatin (LIPITOR) 10 MG tablet Take 1 tablet (10 mg total) by mouth once daily 90 tablet 3 Ca/D3/mag ox/zinc/cop/mang/bor (CALCIUM 600-D3 PLUS, MAG-ZINC, ORAL) Take by mouth cholecalciferol, vitamin D3, (VITAMIN D3) 125 mcg (5,000 unit) tablet Take 5,000 Units by mouth once daily clindamycin (CLEOCIN) 150 MG capsule Take 150 mg by mouth as needed. Pt takes this every six months when going to the dentist 0 cyanocobalamin (VITAMIN B12) 1000 MCG tablet Take 1,000 mcg by mouth once daily dapagliflozin propanediol (FARXIGA) 10 mg tablet Take 1 tablet (10 mg total) by mouth once daily 90 tablet 3 FUROsemide (LASIX) 20 MG tablet Take 20 mg by mouth as needed glipiZIDE (GLUCOTROL XL) 10 MG XL tablet Take 1 tablet (10 mg total) by mouth once daily 90 tablet 3 insulin GLARGINE (LANTUS SOLOSTAR U-100 INSULIN) pen injector (concentration 100 units/mL) Inject up to 5 units at night before bedtime. metoprolol succinate (TOPROL-XL) 50 MG XL tablet Take 1 tablet (50 mg total) by mouth once daily 90 tablet 3 NON FORMULARY Omega XL-supplement ONETOUCH VERIO TEST STRIPS test strip 1 strip once daily pen needle, diabetic 32 gauge x 1/4" needle Use as directed 100 each 12 semaglutide (RYBELSUS) 7 mg tablet Take 1 tablet (7 mg total) by mouth once daily Do not cut, crush, or chew. 30 tablet 6 cinnamon bark 500 mg capsule Take 500 mg by mouth once daily (Patient not taking: Reported on 11/24/2022) lisinopriL (ZESTRIL) 20 MG tablet Take 1 tablet (20 mg total) by mouth once daily (Patient  not taking: Reported on 11/24/2022) 90 tablet 3 lisinopriL-hydroCHLOROthiazide (ZESTORETIC) 20-12.5 mg tablet Take 1 tablet by mouth once daily (Patient not  taking: Reported on 11/24/2022)  No current facility-administered medications on file prior to visit.  Medical / Surgical History  Past Medical History: Diagnosis Date Diabetes mellitus type 2, uncomplicated (CMS/HHS-[name]) Ductal carcinoma in situ (DCIS) of right breast Hypercholesterolemia Hypertension Hypothyroidism Morbid obesity (CMS/HHS-[name]) Osteoarthritis Pulmonary hypertension (CMS/HHS-[name]) Skin cancer of face Vitamin D deficiency   Past Surgical History: Procedure Laterality Date Left total knee arthroplasty 07/25/2011 COLONOSCOPY 01/19/2012 KNEE ARTHROSCOPY Left MASTECTOMY PARTIAL Skin cancer removed from face   Physical Exam  Ht:160 cm (5\' 3" ) Wt:79.6 kg (175 lb 6.4 oz) BMI: Body mass index is 31.07 kg/m.  General/Constitutional: No apparent distress: well-nourished and well developed. Eyes: Pupils equal, round with synchronous movement. Lymphatic: No palpable adenopathy. Respiratory: Patient has good chest rise and fall with inspiration and expiration. Upon auscultation, lungs are clear bilaterally in all lung fields. There are no wheezes, rhonchi or rails appreciated. Cardiovascular: Upon auscultation, patient has a regular rate and rhythm. There does not appear to be any heaves, murmurs, rubs or gallops appreciated. Peripheral posterior tibial pulses appreciated bilaterally 2+. Integumentary: No impressive skin lesions present, except as noted in detailed exam. Neuro/Psych: Normal mood and affect, oriented to person, place and time. Musculoskeletal: see right knee exam below  Right knee exam Upon inspection of the right knee, there does not appear to be any skin changes, rashes or open wounds. Mild swelling appreciated. Noted to have a valgus deformity. With palpation, the patient reports having some pain along the lateral aspect of the knee, denies having any difficulty or pain anywhere else. Patient has difficulty getting to full extension, about 9 degrees off  of full extension and 116 degrees of flexion. Patient has good strength 5/5 with flexion and extension at this knee. Some mild grinding appreciated at the patella from deep flexion into extension, tracking well. Varus valgus stress testing shows lateral pseudolaxity. Anterior, posterior drawer testing negative. Patient is neurovascularly intact down his right lower extremity to all dermatomes. Posterior tibial pulses appreciated 2+.  Imaging right Knee Imaging: A series of x-rays were ordered and interpreted the patient's right knee including AP, lateral and sunrise views. Patient had difficulty with weightbearing. However, there is 100% joint space narrowing on the lateral cartilage space with complete bone-on-bone articulation. There is resulting valgus alignment appreciated. There is subchondral sclerosis as well as osteophyte formation off of the lateral tibial plateau and femoral condyle appreciated. There does not appear to be any fractures, dislocation, lytic lesions or gross deformities appreciated on films.  Assesment and Plan Knee DJD  I have recommended that Carol Ramsey undergo right total knee replacement. Consents has been signed. The risks, benefits, prognosis and alternatives including but not limited to DVT, PE, infection, neurovascular injury, failure of the procedure and death were explained to the patient and she is willing to proceed with surgery as described to her by myself. Plan will be for post operative admission of at least 1 midnight for pain control and PT. She will be managed with DVT prophylaxis, antibiotics preoperatively for 24 hours and aggressive in patient rehab.  Pre, intra and post op interventions were discussed. Patient has good understanding  Medication Reconciliation was performed. Discussed cessation of diabetes medications, diuretics, lisinopril, vitamins and supplements prior to surgery.  A total of 50 minutes was spent reviewing patient's charts, medical  reconciliation, discussing/educating the patient about surgical interventions, and  answering any questions provided by the patient.  JOSHUA Kendrick Fries, PA [facility] 11/24/2022  Electronically signed by Raenette Rover, PA at 11/24/2022 6:09 PM EDT

## 2022-11-30 NOTE — Anesthesia Procedure Notes (Signed)
Spinal  Patient location during procedure: OR Start time: 11/30/2022 7:25 AM End time: 11/30/2022 7:29 AM Reason for block: surgical anesthesia Staffing Performed: resident/CRNA  Anesthesiologist: Corinda Gubler, MD Resident/CRNA: Berniece Pap, CRNA Performed by: Berniece Pap, CRNA Authorized by: Corinda Gubler, MD   Preanesthetic Checklist Completed: patient identified, IV checked, site marked, risks and benefits discussed, surgical consent, monitors and equipment checked, pre-op evaluation and timeout performed Spinal Block Patient position: sitting Prep: DuraPrep Patient monitoring: heart rate, cardiac monitor, continuous pulse ox and blood pressure Approach: midline Location: L3-4 Injection technique: single-shot Needle Needle type: Sprotte and Pencan  Needle gauge: 24 G Needle length: 9 cm Assessment Sensory level: T4 Events: CSF return

## 2022-11-30 NOTE — Anesthesia Preprocedure Evaluation (Signed)
Anesthesia Evaluation  Patient identified by MRN, date of birth, ID band Patient awake    Reviewed: Allergy & Precautions, NPO status , Patient's Chart, lab work & pertinent test results  History of Anesthesia Complications Negative for: history of anesthetic complications  Airway Mallampati: II  TM Distance: >3 FB Neck ROM: Full    Dental  (+) Poor Dentition, Chipped   Pulmonary neg pulmonary ROS, neg sleep apnea, neg COPD, Patient abstained from smoking.Not current smoker   Pulmonary exam normal breath sounds clear to auscultation       Cardiovascular Exercise Tolerance: Good METShypertension, Pt. on medications (-) CAD and (-) Past MI (-) dysrhythmias  Rhythm:Regular Rate:Normal - Systolic murmurs TTE 2024 relatively unremarkable   Neuro/Psych  Neuromuscular disease  negative psych ROS   GI/Hepatic ,neg GERD  ,,(+)     (-) substance abuse    Endo/Other  diabetes, Insulin Dependent  Patient no longer takes GLP1agonists  Renal/GU CRFRenal disease     Musculoskeletal  (+) Arthritis ,    Abdominal   Peds  Hematology   Anesthesia Other Findings Past Medical History: No date: Anemia No date: Arthritis No date: Basal cell carcinoma (BCC) of lower leg No date: Bilateral leg edema No date: Calcaneal bursitis (heel), unspecified laterality No date: Chronic pain syndrome No date: CKD (chronic kidney disease) stage 3, GFR 30-59 ml/min ([name]) 04/2021: COVID-19 No date: DDD (degenerative disc disease), lumbar No date: Diastolic dysfunction     Comment:  a.) TTE 12/19/2016: EF >55%, mod LVH, triv AR, mild               MR/TR/PR, RVSP 57.5; b.) TTE 02/10/2017: EF >55%, mild               BAE, mild RVE, triv PR, mild MR, mod TR, RVSP 69.9, G1DD;              c.) TTE 07/21/2022: EF >55%, triv MR, mild TR, RVSP 40.3,              G1DD No date: DOE (dyspnea on exertion) 2018: Ductal carcinoma in situ (DCIS) of right  breast     Comment:  a.) s/p partial mastectomy + adjuvant XRT No date: Heart murmur No date: History of bilateral cataract extraction No date: Hyperlipidemia No date: Hypertension No date: Hypoalbuminemia No date: Hypomagnesemia No date: Hypothyroidism No date: Low serum vitamin B12 No date: Lumbar facet arthropathy No date: Lumbar foraminal stenosis No date: Lumbar nerve root compression No date: Lumbar spondylosis No date: Lymphedema No date: 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 No date: OA (osteoarthritis) No date: Obesity No date: Protrusion of lumbar intervertebral disc No date: Pulmonary hypertension ([name])     Comment:  a.) TTE 12/19/2016: RVSP 57.5; b.) TTE 02/10/2017: RVSP               69.9; c.) TTE 07/21/2022: RVSP 40.3 No date: PVC's (premature ventricular contractions) No date: Sacroiliac joint dysfunction of left side No date: T2DM (type 2 diabetes mellitus) ([name]) No date: Vitamin D deficiency  Reproductive/Obstetrics                             Anesthesia Physical Anesthesia Plan  ASA: 3  Anesthesia Plan: Spinal   Post-op Pain Management: Ofirmev IV (intra-op)*   Induction: Intravenous  PONV Risk Score and Plan: 2 and Ondansetron, Dexamethasone, Propofol infusion, TIVA and Midazolam  Airway Management Planned: Natural Airway  Additional Equipment: None  Intra-op Plan:   Post-operative Plan:   Informed Consent: I have reviewed the patients History and Physical, chart, labs and discussed the procedure including the risks, benefits and alternatives for the proposed anesthesia with the patient or authorized representative who has indicated his/her understanding and acceptance.       Plan Discussed with: CRNA and Surgeon  Anesthesia Plan Comments: (Discussed R/B/A of neuraxial anesthesia technique with patient: - rare risks of spinal/epidural hematoma, nerve damage, infection - Risk of PDPH - Risk of nausea and  vomiting - Risk of conversion to general anesthesia and its associated risks, including sore throat, damage to lips/eyes/teeth/oropharynx, and rare risks such as cardiac and respiratory events. - Risk of allergic reactions  Discussed the role of CRNA in patient's perioperative care.  Patient voiced understanding.)       Anesthesia Quick Evaluation

## 2022-11-30 NOTE — Transfer of Care (Signed)
Immediate Anesthesia Transfer of Care Note  Patient: Carol Ramsey  Procedure(s) Performed: COMPUTER ASSISTED TOTAL KNEE ARTHROPLASTY (Right: Knee)  Patient Location: PACU  Anesthesia Type:Spinal  Level of Consciousness: awake and drowsy  Airway & Oxygen Therapy: Patient Spontanous Breathing and Patient connected to face mask oxygen  Post-op Assessment: Report given to RN and Post -op Vital signs reviewed and stable  Post vital signs: Reviewed and stable  Last Vitals:  Vitals Value Taken Time  BP 106/60 11/30/22 1114  Temp    Pulse 67 11/30/22 1115  Resp 16 11/30/22 1115  SpO2 100 % 11/30/22 1115  Vitals shown include unfiled device data.  Last Pain:  Vitals:   11/30/22 0616  TempSrc: Oral  PainSc: 0-No pain         Complications: No notable events documented.

## 2022-11-30 NOTE — Progress Notes (Signed)
PT Cancellation Note  Patient Details Name: Carol Ramsey MRN: [id number] DOB: 16-Mar-1946   Cancelled Treatment:    Reason Eval/Treat Not Completed: Medical issues which prohibited therapy: Per chart review pt with post-op sinus bradycardia with most recent HR remaining in the 40's.  Spoke to patient's nurse who requested no PT services this date secondary to low HR.  Will attempt to see pt at a future date/time as medically appropriate.     Ovidio Hanger PT, DPT 11/30/22, 4:38 PM

## 2022-12-01 DIAGNOSIS — Z880 Allergy status to penicillin: Secondary | ICD-10-CM | POA: Diagnosis not present

## 2022-12-01 DIAGNOSIS — E1122 Type 2 diabetes mellitus with diabetic chronic kidney disease: Secondary | ICD-10-CM | POA: Diagnosis present

## 2022-12-01 DIAGNOSIS — E11649 Type 2 diabetes mellitus with hypoglycemia without coma: Secondary | ICD-10-CM | POA: Diagnosis not present

## 2022-12-01 DIAGNOSIS — Z7984 Long term (current) use of oral hypoglycemic drugs: Secondary | ICD-10-CM | POA: Diagnosis not present

## 2022-12-01 DIAGNOSIS — Z79899 Other long term (current) drug therapy: Secondary | ICD-10-CM | POA: Diagnosis not present

## 2022-12-01 DIAGNOSIS — M1711 Unilateral primary osteoarthritis, right knee: Secondary | ICD-10-CM | POA: Diagnosis present

## 2022-12-01 DIAGNOSIS — E039 Hypothyroidism, unspecified: Secondary | ICD-10-CM | POA: Diagnosis present

## 2022-12-01 DIAGNOSIS — Z885 Allergy status to narcotic agent status: Secondary | ICD-10-CM | POA: Diagnosis not present

## 2022-12-01 DIAGNOSIS — E7800 Pure hypercholesterolemia, unspecified: Secondary | ICD-10-CM | POA: Diagnosis present

## 2022-12-01 DIAGNOSIS — E1165 Type 2 diabetes mellitus with hyperglycemia: Secondary | ICD-10-CM | POA: Diagnosis present

## 2022-12-01 DIAGNOSIS — Z888 Allergy status to other drugs, medicaments and biological substances status: Secondary | ICD-10-CM | POA: Diagnosis not present

## 2022-12-01 DIAGNOSIS — Z85828 Personal history of other malignant neoplasm of skin: Secondary | ICD-10-CM | POA: Diagnosis not present

## 2022-12-01 DIAGNOSIS — Z8616 Personal history of COVID-19: Secondary | ICD-10-CM | POA: Diagnosis not present

## 2022-12-01 DIAGNOSIS — Z9011 Acquired absence of right breast and nipple: Secondary | ICD-10-CM | POA: Diagnosis not present

## 2022-12-01 DIAGNOSIS — Z8249 Family history of ischemic heart disease and other diseases of the circulatory system: Secondary | ICD-10-CM | POA: Diagnosis not present

## 2022-12-01 DIAGNOSIS — I129 Hypertensive chronic kidney disease with stage 1 through stage 4 chronic kidney disease, or unspecified chronic kidney disease: Secondary | ICD-10-CM | POA: Diagnosis present

## 2022-12-01 DIAGNOSIS — N183 Chronic kidney disease, stage 3 unspecified: Secondary | ICD-10-CM | POA: Diagnosis present

## 2022-12-01 DIAGNOSIS — G894 Chronic pain syndrome: Secondary | ICD-10-CM | POA: Diagnosis present

## 2022-12-01 DIAGNOSIS — Z86000 Personal history of in-situ neoplasm of breast: Secondary | ICD-10-CM | POA: Diagnosis not present

## 2022-12-01 DIAGNOSIS — Z794 Long term (current) use of insulin: Secondary | ICD-10-CM | POA: Diagnosis not present

## 2022-12-01 DIAGNOSIS — I272 Pulmonary hypertension, unspecified: Secondary | ICD-10-CM | POA: Diagnosis present

## 2022-12-01 DIAGNOSIS — Z882 Allergy status to sulfonamides status: Secondary | ICD-10-CM | POA: Diagnosis not present

## 2022-12-01 DIAGNOSIS — Z96652 Presence of left artificial knee joint: Secondary | ICD-10-CM | POA: Diagnosis present

## 2022-12-01 DIAGNOSIS — E559 Vitamin D deficiency, unspecified: Secondary | ICD-10-CM | POA: Diagnosis present

## 2022-12-01 LAB — GLUCOSE, CAPILLARY
Glucose-Capillary: 150 mg/dL — ABNORMAL HIGH (ref 70–99)
Glucose-Capillary: 161 mg/dL — ABNORMAL HIGH (ref 70–99)
Glucose-Capillary: 151 mg/dL — ABNORMAL HIGH (ref 70–99)
Glucose-Capillary: 167 mg/dL — ABNORMAL HIGH (ref 70–99)

## 2022-12-01 MED ORDER — SODIUM CHLORIDE 0.9 % IV BOLUS
500.0000 mL | Freq: Once | INTRAVENOUS | Status: AC
  Administered 2022-12-01: 500 mL via INTRAVENOUS

## 2022-12-01 NOTE — Anesthesia Postprocedure Evaluation (Signed)
Anesthesia Post Note  Patient: Carol Ramsey  Procedure(s) Performed: COMPUTER ASSISTED TOTAL KNEE ARTHROPLASTY (Right: Knee)  Patient location during evaluation: Nursing Unit Anesthesia Type: Spinal Level of consciousness: awake, awake and alert and oriented Pain management: pain level controlled Vital Signs Assessment: post-procedure vital signs reviewed and stable Respiratory status: spontaneous breathing, nonlabored ventilation and respiratory function stable Cardiovascular status: blood pressure returned to baseline and stable Postop Assessment: no headache and no backache Anesthetic complications: no  No notable events documented.   Last Vitals:  Vitals:   12/01/22 0406 12/01/22 0738  BP: (!) 142/69 (!) 144/63  Pulse: 61 71  Resp: 20 16  Temp: 36.8 C (!) 36.4 C  SpO2: 100% 100%    Last Pain:  Vitals:   12/01/22 0007  TempSrc: Oral  PainSc:                  Ginger Carne

## 2022-12-01 NOTE — Evaluation (Addendum)
Physical Therapy Evaluation Patient Details Name: Carol Ramsey MRN: [id number] DOB: [birth year] Today's Date: 12/01/2022  History of Present Illness  Carol Ramsey is a 77yoF who presents to [facility] for elective Rt TKA. PTA pt lives at home with husband, also have support planned from son who works from home. Pt was a household AMB with SPC >1 year, fluent with modified technique for 6 entry stairs.  Clinical Impression  Pt in bed on entry, pain meds received prior to session, pt reports pain worse again since being back in bone foam. Son and husband at bedside report plan to assist at home upon DC. Pt comes to EOB without any assist. Unable to stand without minA twice. Pt has lightheadedness, nausea, feels like she might 'go out' after first standing bout at EOB x 60sec. Partial recovery seated EOB x3 minutes, enough to allow for SPT to recliner where ultimately she appear comfortable, pain controlled, nausea partially resolved. HR WNL during session. Pt generally weak, but pain does not seem to be the biggest factor in impaired limb movement and transfers. Will see again later in day. NSTG made aware of need for tele battery and antiemetics. Pt given diet ginger ale as requested. Author/patient discussed importance of stairs training/performance prior to DC.       If plan is discharge home, recommend the following: A little help with walking and/or transfers;Help with stairs or ramp for entrance;Assist for transportation   Can travel by private vehicle   Yes    Equipment Recommendations Rolling walker (2 wheels);BSC/3in1  Recommendations for Other Services       Functional Status Assessment Patient has had a recent decline in their functional status and demonstrates the ability to make significant improvements in function in a reasonable and predictable amount of time.     Precautions / Restrictions Precautions Precautions: Fall Restrictions Weight Bearing Restrictions: No       Mobility  Bed Mobility Overal bed mobility: Needs Assistance Bed Mobility: Supine to Sit     Supine to sit: Supervision          Transfers Overall transfer level: Needs assistance Equipment used: Rolling walker (2 wheels) Transfers: Sit to/from Stand, Bed to chair/wheelchair/BSC Sit to Stand: Min assist   Step pivot transfers: Min assist            Ambulation/Gait Ambulation/Gait assistance:  (deferred to later; having presyncope symptoms prior while standing at bed side)           Pre-gait activities: marching in place at [employer] Mobility     Tilt Bed    Modified Rankin (Stroke Patients Only)       Balance Overall balance assessment: Modified Independent                                           Pertinent Vitals/Pain Pain Assessment Pain Assessment: 0-10 Pain Score: 8  Pain Location: operative knee, reports due to bone foam Pain Intervention(s): Limited activity within patient's tolerance, Monitored during session, Premedicated before session, Repositioned, Ice applied    Home Living Family/patient expects to be discharged to:: Private residence Living Arrangements: Spouse/significant other;Children (son works from home, is readily available) Available Help at Discharge: Family Type of Home: House Home Access: Stairs to enter Entrance Stairs-Rails: Doctor, general practice of Steps:  6   Home Layout: One level Home Equipment: Agricultural consultant (2 wheels);Cane - single point      Prior Function Prior Level of Function : Independent/Modified Independent             Mobility Comments: household AMB for > 1 year, reports not regulalrly shopping or churching estimated > 2years ago. Uses SPC for AMB, limited by chronic Rt knee pain and back pain.       Hand Dominance        Extremity/Trunk Assessment                Communication      Cognition Arousal/Alertness:  Awake/alert Behavior During Therapy: Flat affect (soft spoken, hypoactive.) Overall Cognitive Status: Within Functional Limits for tasks assessed                                          General Comments      Exercises Total Joint Exercises Ankle Circles/Pumps: AROM, Both, 15 reps, Supine Hip ABduction/ADduction: AAROM, Right, 10 reps, Supine, Limitations Hip Abduction/Adduction Limitations: requires modA of limb Straight Leg Raises: AROM, Supine (isometric control after assist out of blue foam) Goniometric ROM: 0-80 degrees ROM during session, functional activity, around 12-15 in normal stance.   Assessment/Plan    PT Assessment Patient needs continued PT services  PT Problem List Decreased strength;Decreased activity tolerance;Decreased range of motion;Decreased balance;Decreased mobility;Decreased knowledge of use of DME;Decreased safety awareness;Decreased knowledge of precautions       PT Treatment Interventions DME instruction;Stair training;Gait training;Patient/family education;Functional mobility training;Therapeutic activities;Therapeutic exercise;Balance training    PT Goals (Current goals can be found in the Care Plan section)  Acute Rehab PT Goals Patient Stated Goal: pain control, nausea control PT Goal Formulation: With patient Time For Goal Achievement: 12/15/22 Potential to Achieve Goals: Good    Frequency BID     Co-evaluation               AM-PAC PT "6 Clicks" Mobility  Outcome Measure Help needed turning from your back to your side while in a flat bed without using bedrails?: None Help needed moving from lying on your back to sitting on the side of a flat bed without using bedrails?: None Help needed moving to and from a bed to a chair (including a wheelchair)?: A Lot Help needed standing up from a chair using your arms (e.g., wheelchair or bedside chair)?: A Lot Help needed to walk in hospital room?: A Lot Help needed climbing  3-5 steps with a railing? : A Lot 6 Click Score: 16    End of Session   Activity Tolerance: Treatment limited secondary to medical complications (Comment);No increased pain Patient left: in chair;with family/visitor present;with call bell/phone within reach;with chair alarm set Nurse Communication:  (nausea, tele battery needs) PT Visit Diagnosis: Difficulty in walking, not elsewhere classified (R26.2);Other abnormalities of gait and mobility (R26.89);Muscle weakness (generalized) (M62.81)    Time: 1610-9604 PT Time Calculation (min) (ACUTE ONLY): 37 min   Charges:   PT Evaluation $PT Eval Moderate Complexity: 1 Mod PT Treatments $Therapeutic Activity: 8-22 mins PT General Charges $$ ACUTE PT VISIT: 1 Visit        10:18 AM, 12/01/22 [name], PT, DPT Physical Therapist - [facility]  [phone] (ASCOM)     Clarence Cogswell C 12/01/2022, 10:13 AM

## 2022-12-01 NOTE — Progress Notes (Signed)
Physical Therapy Treatment Patient Details Name: Carol Ramsey MRN: [id number] DOB: Jun 17, 1945 Today's Date: 12/01/2022   History of Present Illness Carol Ramsey is a 77yoF who presents to [facility] for elective Rt TKA. PTA pt lives at home with husband, also have support planned from son who works from home. Pt was a household AMB with SPC >1 year, fluent with modified technique for 6 entry stairs    PT Comments  Coordinated session with NSG  to maximize pain control, however pt unable to have additional meds as planned. Pain appeared to be at goal throughout however. Pt shown additional exercises while up in chair, has excellent quads control for SAQ and full A/ROM to 0 degrees, however author provides minA to maintain pain within goal. Pt partakes in 2 STS transfers, no cues needed for technique, but pain and queasiness preclude a great number of these. Will plan to bring handout next session, likely improved tolerance for additional AMB training and stairs training next day.    If plan is discharge home, recommend the following: A little help with walking and/or transfers;Help with stairs or ramp for entrance;Assist for transportation   Can travel by private vehicle     Yes  Equipment Recommendations  Rolling walker (2 wheels);BSC/3in1    Recommendations for Other Services       Precautions / Restrictions Precautions Precautions: Fall;Knee Precaution Booklet Issued: No Precaution Comments: will bring in future Restrictions Weight Bearing Restrictions: Yes RLE Weight Bearing: Weight bearing as tolerated     Mobility  Bed Mobility Overal bed mobility: Needs Assistance Bed Mobility: Supine to Sit     Supine to sit: Supervision          Transfers Overall transfer level: Needs assistance Equipment used: Rolling walker (2 wheels) Transfers: Sit to/from Stand Sit to Stand: Supervision   Step pivot transfers: Min assist       General transfer comment: painful, but no cues  needed to perform. Heavy use of BUE, minimal use of surgical leg    Ambulation/Gait Ambulation/Gait assistance:  (still not tolerating transfers and standing veyr well, did not want to provoke nausea, expecially given in ability to have meds.)           Pre-gait activities: marching in place at [employer] Mobility     Tilt Bed    Modified Rankin (Stroke Patients Only)       Balance Overall balance assessment: Modified Independent                                          Cognition Arousal/Alertness: Awake/alert Behavior During Therapy: WFL for tasks assessed/performed Overall Cognitive Status: Within Functional Limits for tasks assessed                                          Exercises Total Joint Exercises Ankle Circles/Pumps: AROM, Both, 15 reps, Supine Short Arc Quad: Right, AAROM, 10 reps, Supine Heel Slides: AAROM, Right, 10 reps, Supine Hip ABduction/ADduction: AROM, Right, 10 reps, Supine Hip Abduction/Adduction Limitations: requires modA of limb Straight Leg Raises: AAROM, Right, 5 reps, Supine Goniometric ROM: 0-80 degrees ROM during session, functional activity, around 12-15 in normal stance. Other Exercises  Other Exercises: STS from recliner 2x (stopped prior to 3rd attempt, pt did not appears to be tolerating well).    General Comments General comments (skin integrity, edema, etc.): all lines/leads/drain intact pre/post session      Pertinent Vitals/Pain Pain Assessment Pain Assessment: 0-10 Pain Score: 7  Pain Location: operative knee, reports due to bone foam Pain Descriptors / Indicators: Operative site guarding Pain Intervention(s): Ice applied, Limited activity within patient's tolerance (unable to have any pain meds per RN)    Home Living Family/patient expects to be discharged to:: Private residence Living Arrangements: Spouse/significant other;Children Available Help  at Discharge: Family;Available 24 hours/day Type of Home: House Home Access: Stairs to enter Entrance Stairs-Rails: Doctor, general practice of Steps: 6   Home Layout: One level Home Equipment: Agricultural consultant (2 wheels);Cane - single point;BSC/3in1      Prior Function            PT Goals (current goals can now be found in the care plan section) Acute Rehab PT Goals Patient Stated Goal: pain control, nausea control PT Goal Formulation: With patient Time For Goal Achievement: 12/15/22 Potential to Achieve Goals: Good Progress towards PT goals: Progressing toward goals    Frequency    BID      PT Plan Current plan remains appropriate    Co-evaluation              AM-PAC PT "6 Clicks" Mobility   Outcome Measure  Help needed turning from your back to your side while in a flat bed without using bedrails?: None Help needed moving from lying on your back to sitting on the side of a flat bed without using bedrails?: None Help needed moving to and from a bed to a chair (including a wheelchair)?: A Lot Help needed standing up from a chair using your arms (e.g., wheelchair or bedside chair)?: A Lot Help needed to walk in hospital room?: A Lot Help needed climbing 3-5 steps with a railing? : A Lot 6 Click Score: 16    End of Session Equipment Utilized During Treatment: Gait belt Activity Tolerance: Treatment limited secondary to medical complications (Comment);No increased pain Patient left: in chair;with family/visitor present;with call bell/phone within reach;with chair alarm set Nurse Communication:  (trying to time pain meds for PM session) PT Visit Diagnosis: Difficulty in walking, not elsewhere classified (R26.2);Other abnormalities of gait and mobility (R26.89);Muscle weakness (generalized) (M62.81)     Time: 4132-4401 PT Time Calculation (min) (ACUTE ONLY): 25 min  Charges:    $Therapeutic Exercise: 8-22 mins $Therapeutic Activity: 8-22 mins PT  General Charges $$ ACUTE PT VISIT: 1 Visit                    4:29 PM, 12/01/22 [name], PT, DPT Physical Therapist - [facility]  [phone] (ASCOM)     [name] C 12/01/2022, 4:23 PM

## 2022-12-01 NOTE — Evaluation (Signed)
Occupational Therapy Evaluation Patient Details Name: Carol Ramsey MRN: [id number] DOB: 25-Mar-1946 Today's Date: 12/01/2022   History of Present Illness Pt is a 77 year old female s/p R TKA 11/30/22; PMH significant for iastolic dysfunction, pulmonary hypertension, PVCs, cardiac murmur, HTN, HLD, T2DM, hypothyroidism, CKD-III, DOE, anemia, remote breast cancer (s/p partial mastectomy + adjuvant XRT), OA, chronic pain syndrome, lumbar DDD, lymphedema   Clinical Impression   Chart reviewed to date, pt greeted in chair, nurse cleared pt for participation in OT evaluation. Pt seen for OT evaluation this date, POD#1 from above surgery. PTA pt generally MOD I in ADL, assist for IADL. Pt amb with SPC household/short community distances. Pt provided education re: instructed in polar care mgt, falls prevention strategies, home/routines modifications, DME/AE for LB bathing and dressing tasks. Pt presents with deficits in activity tolerance, balance, strength affecting safe and optimal ADL completion. Pt would benefit from skilled OT services including additional instruction in dressing techniques with or without assistive devices for dressing and bathing skills to support recall and carryover and ultimately to facilitate return to PLOF, maximize safety, independence, and minimize falls risk and caregiver burden.     Of note:  Seated in chair with legs up: BP 119/53 (MAP 70), HR 62 bpm spo2 100% on RA Standing (immediately): BP 99/78 (MAP 87), HR 98 bpm, spo2 100% on RA- nurse in room and aware   Recommendations for follow up therapy are one component of a multi-disciplinary discharge planning process, led by the attending physician.  Recommendations may be updated based on patient status, additional functional criteria and insurance authorization.   Assistance Recommended at Discharge Intermittent Supervision/Assistance  Patient can return home with the following A lot of help with walking and/or transfers;A  lot of help with bathing/dressing/bathroom;Assistance with cooking/housework;Assist for transportation;Help with stairs or ramp for entrance;Direct supervision/assist for financial management    Functional Status Assessment  Patient has had a recent decline in their functional status and demonstrates the ability to make significant improvements in function in a reasonable and predictable amount of time.  Equipment Recommendations  None recommended by OT (pt has recommended equipment)    Recommendations for Other Services       Precautions / Restrictions Precautions Precautions: Fall Restrictions Weight Bearing Restrictions: Yes RLE Weight Bearing: Weight bearing as tolerated      Mobility Bed Mobility               General bed mobility comments: NT in recliner pre/post session    Transfers Overall transfer level: Needs assistance Equipment used: Rolling walker (2 wheels) Transfers: Sit to/from Stand, Bed to chair/wheelchair/BSC Sit to Stand: Min assist, +2 safety/equipment                  Balance Overall balance assessment: Needs assistance Sitting-balance support: Feet supported Sitting balance-Leahy Scale: Good     Standing balance support: Bilateral upper extremity supported, During functional activity, Reliant on assistive device for balance Standing balance-Leahy Scale: Fair                             ADL either performed or assessed with clinical judgement   ADL Overall ADL's : Needs assistance/impaired Eating/Feeding: Sitting;Set up   Grooming: Wash/dry face;Sitting;Set up           Upper Body Dressing : Minimal assistance Upper Body Dressing Details (indicate cue type and reason): anticipate Lower Body Dressing: Maximal assistance Lower Body Dressing Details (  indicate cue type and reason): socks Toilet Transfer: Min guard;Minimal assistance;+2 for safety/equipment;BSC/3in1 Toilet Transfer Details (indicate cue type and  reason): intermittent vcs for technique Toileting- Clothing Manipulation and Hygiene: Maximal assistance;Sit to/from stand Toileting - Clothing Manipulation Details (indicate cue type and reason): after void on bsc     Functional mobility during ADLs: Min guard;Minimal assistance;+2 for safety/equipment (short amb transfers on this date)       Vision Patient Visual Report: No change from baseline       Perception     Praxis      Pertinent Vitals/Pain Pain Assessment Pain Assessment: 0-10 Pain Score: 4  Pain Descriptors / Indicators: Discomfort Pain Intervention(s): Ice applied, Repositioned, Monitored during session, Limited activity within patient's tolerance     Hand Dominance     Extremity/Trunk Assessment Upper Extremity Assessment Upper Extremity Assessment: Overall WFL for tasks assessed   Lower Extremity Assessment Lower Extremity Assessment: Generalized weakness;RLE deficits/detail RLE Deficits / Details: s/p R TKA       Communication Communication Communication: No difficulties   Cognition Arousal/Alertness: Awake/alert Behavior During Therapy: WFL for tasks assessed/performed Overall Cognitive Status: Within Functional Limits for tasks assessed Area of Impairment: Problem solving                             Problem Solving: Slow processing       General Comments  all lines/leads/drain intact pre/post session    Exercises     Shoulder Instructions      Home Living Family/patient expects to be discharged to:: Private residence Living Arrangements: Spouse/significant other;Children Available Help at Discharge: Family;Available 24 hours/day Type of Home: House Home Access: Stairs to enter [employer] of Steps: 6 Entrance Stairs-Rails: Right;Left Home Layout: One level               Home Equipment: Agricultural consultant (2 wheels);Cane - single point;BSC/3in1          Prior Functioning/Environment Prior Level of  Function : Independent/Modified Independent             Mobility Comments: amb house hold distances with SPC, ADLs Comments: generally MOD I-I with ADL, assist with IADL as needed, pt reports she goes to [employer] but does not drive, family will cook/clean        OT Problem List: Decreased strength;Decreased activity tolerance;Decreased knowledge of use of DME or AE;Impaired balance (sitting and/or standing);Decreased safety awareness;Decreased knowledge of precautions      OT Treatment/Interventions: Self-care/ADL training;Balance training;Therapeutic exercise;DME and/or AE instruction;Therapeutic activities;Patient/family education    OT Goals(Current goals can be found in the care plan section) Acute Rehab OT Goals Patient Stated Goal: get stronger OT Goal Formulation: With patient/family Time For Goal Achievement: 12/15/22 Potential to Achieve Goals: Good ADL Goals Pt Will Perform Grooming: with modified independence;sitting;standing Pt Will Perform Lower Body Dressing: with modified independence;with adaptive equipment Pt Will Transfer to Toilet: with modified independence;ambulating Pt Will Perform Toileting - Clothing Manipulation and hygiene: with modified independence;sit to/from stand  OT Frequency: Min 1X/week       AM-PAC OT "6 Clicks" Daily Activity     Outcome Measure Help from another person eating meals?: None Help from another person taking care of personal grooming?: None Help from another person toileting, which includes using toliet, bedpan, or urinal?: A Lot Help from another person bathing (including washing, rinsing, drying)?: A Lot Help from another person to put on and taking off regular upper body  clothing?: A Little Help from another person to put on and taking off regular lower body clothing?: A Lot 6 Click Score: 17   End of Session Equipment Utilized During Treatment: Gait belt;Rolling walker (2 wheels) Nurse Communication: Mobility status  (vitals)  Activity Tolerance: Patient limited by fatigue Patient left: in chair;with call bell/phone within reach;with nursing/sitter in room;with family/visitor present  OT Visit Diagnosis: Unsteadiness on feet (R26.81);Other abnormalities of gait and mobility (R26.89)                Time: 6433-2951 OT Time Calculation (min): 30 min Charges:  OT General Charges $OT Visit: 1 Visit OT Evaluation $OT Eval Moderate Complexity: 1 Mod  Oleta Mouse, OTD OTR/L  12/01/22, 12:43 PM

## 2022-12-01 NOTE — Progress Notes (Signed)
Subjective: 1 Day Post-Op Procedure(s) (LRB): COMPUTER ASSISTED TOTAL KNEE ARTHROPLASTY (Right) Patient reports pain as 8 on 0-10 scale.  Patient states that she has had some increased pain of her right knee, that pain meds have helped with.  She was not able to tolerate oral pain medication because she had 2 episodes of emesis yesterday.  Was given IV Dilaudid overnight which helped her sleep and with the discomfort.  States that it is back up this morning.  Discussed with nursing states that she wants to wait until the patient has food in their stomach before giving any more oral pain medication.   Patient was also sent to the floor for evaluation of her bradycardia, patient denies having any headache, fatigue or dizziness. Patient seen in rounds with Dr. Ernest Pine. Patient is  fair, states that her pain is increased, but denies any CP, SOB, fevers or chills.  Does admit to having 2 emesis events yesterday, does not have any feelings of nausea or emesis today. We will start therapy today.  Plan is to go Home after hospital stay.  Objective: Vital signs in last 24 hours: Temp:  [96.8 F (36 C)-98.5 F (36.9 C)] 97.5 F (36.4 C) (08/01 0738) Pulse Rate:  [39-71] 71 (08/01 0738) Resp:  [10-20] 16 (08/01 0738) BP: (106-157)/(51-96) 144/63 (08/01 0738) SpO2:  [92 %-100 %] 100 % (08/01 0738)  Intake/Output from previous day:  Intake/Output Summary (Last 24 hours) at 12/01/2022 0820 Last data filed at 12/01/2022 0746 Gross per 24 hour  Intake 1885 ml  Output 710 ml  Net 1175 ml    Intake/Output this shift: Total I/O In: -  Out: 160 [Drains:160]  Labs: Recent Labs    11/30/22 0650  HGB 12.9   Recent Labs    11/30/22 0650  HCT 38.0   Recent Labs    11/30/22 0650  NA 133*  K 3.7  CL 98  BUN 14  CREATININE 1.20*  GLUCOSE 156*   No results for input(s): "LABPT", "INR" in the last 72 hours.  EXAM General - Patient is Alert, Appropriate, and Oriented Extremity -  Neurologically intact ABD soft Neurovascular intact Sensation intact distally Intact pulses distally Dorsiflexion/Plantar flexion intact No cellulitis present Compartment soft Dressing - dressing C/D/I and Jones dressing and JP drain stay in place. Motor Function - intact, moving foot and toes well on exam.  Patient able to plantar and dorsiflex with good range of motion and strength, 5/5.  Patient is neurovascularly intact down her right lower extremity to all dermatomes.  Posterior tibial pulses appreciated. JP Drain and Jones dressing remain in place, patient has had 310 cc of output since yesterday, has not worked with PT.  Remains in place to help with postoperative drainage  Past Medical History:  Diagnosis Date   Anemia    Arthritis    Basal cell carcinoma (BCC) of lower leg    Bilateral leg edema    Calcaneal bursitis (heel), unspecified laterality    Chronic pain syndrome    CKD (chronic kidney disease) stage 3, GFR 30-59 ml/min ([name])    COVID-19 04/2021   DDD (degenerative disc disease), lumbar    Diastolic dysfunction    a.) TTE 12/19/2016: EF >55%, mod LVH, triv AR, mild MR/TR/PR, RVSP 57.5; b.) TTE 02/10/2017: EF >55%, mild BAE, mild RVE, triv PR, mild MR, mod TR, RVSP 69.9, G1DD; c.) TTE 07/21/2022: EF >55%, triv MR, mild TR, RVSP 40.3, G1DD   DOE (dyspnea on exertion)  Ductal carcinoma in situ (DCIS) of right breast 2018   a.) s/p partial mastectomy + adjuvant XRT   Heart murmur    History of bilateral cataract extraction    Hyperlipidemia    Hypertension    Hypoalbuminemia    Hypomagnesemia    Hypothyroidism    Low serum vitamin B12    Lumbar facet arthropathy    Lumbar foraminal stenosis    Lumbar nerve root compression    Lumbar spondylosis    Lymphedema    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    OA (osteoarthritis)    Obesity    Protrusion of lumbar intervertebral disc    Pulmonary hypertension ([name])    a.) TTE 12/19/2016: RVSP 57.5; b.) TTE  02/10/2017: RVSP 69.9; c.) TTE 07/21/2022: RVSP 40.3   PVC's (premature ventricular contractions)    Sacroiliac joint dysfunction of left side    T2DM (type 2 diabetes mellitus) ([name])    Vitamin D deficiency     Assessment/Plan: 1 Day Post-Op Procedure(s) (LRB): COMPUTER ASSISTED TOTAL KNEE ARTHROPLASTY (Right) Principal Problem:   Total knee replacement status  Estimated body mass index is 30.81 kg/m as calculated from the following:   Height as of this encounter: 5\' 3"  (1.6 m).   Weight as of this encounter: 78.9 kg. Advance diet Up with therapy  Patient will start to work with physical therapy to pass postoperative PT protocols, ROM and strengthening.  Continue to monitor blood pressure and heart rate with physical therapy interventions  Continue on telemetry for monitor of heart rate  Discussed with the patient continuing to utilize Polar Care  Patient will use bone foam in 20-30 minute intervals  Patient will wear TED hose bilaterally to help prevent DVT and clot formation  Discussed the Aquacel bandage.  This bandage will stay in place 7 days postoperatively.  Can be replaced with honeycomb bandages that will be sent home with the patient  Discussed sending the patient home with tramadol and oxycodone for as needed pain management.  Patient will also be sent home with Celebrex to help with swelling and inflammation.  Patient will take an 81 mg aspirin twice daily for DVT prophylaxis  JP drain and Jones dressing remain in place for postoperative drainage  Weight-Bearing as tolerated to right leg  Patient will plan to follow-up with [facility] orthopedics in 2 weeks for staple removal and reevaluation  Rayburn Go, PA-C [facility] 12/01/2022, 8:20 AM

## 2022-12-01 NOTE — Progress Notes (Signed)
Patient is not able to walk the distance required to go the bathroom, or he/she is unable to safely negotiate stairs required to access the bathroom.  A 3in1 BSC will alleviate this problem   James P. Hooten, Jr. M.D.  

## 2022-12-01 NOTE — Inpatient Diabetes Management (Addendum)
Inpatient Diabetes Program Recommendations  AACE/ADA: New Consensus Statement on Inpatient Glycemic Control (2015)  Target Ranges:  Prepandial:   less than 140 mg/dL      Peak postprandial:   less than 180 mg/dL (1-2 hours)      Critically ill patients:  140 - 180 mg/dL    Latest Reference Range & Units 11/22/22 09:52  Hemoglobin A1C 4.8 - 5.6 % 6.8 (H)    Latest Reference Range & Units 11/30/22 11:17 11/30/22 21:23 12/01/22 07:40 12/01/22 11:22  Glucose-Capillary 70 - 99 mg/dL 161 (H) 096 (H)  2 units Novolog  5 units Semglee 150 (H)  2 units Novolog  161 (H)  3 units Novolog   (H): Data is abnormally high   Admit with: Right TKA  History: DM  Home DM Meds: Farxiga 10 mg daily       Glipizide 10 mg daily       Lantus 15 units as needed     Semaglutide 7 mg daily   Current Orders: Farxiga 10 mg daily     Glipizide 10 mg daily     Novolog Moderate Correction Scale/ SSI (0-15 units) TID AC + HS    Semglee 5 units at bedtime    Semaglutide 7 mg daily      CBGs well controlled on current regimen  Will follow thru discharge    --Will follow patient during hospitalization--  Ambrose Finland RN, MSN, CDCES Diabetes Coordinator Inpatient Glycemic Control Team Team Pager: [phone] (8a-5p)

## 2022-12-01 NOTE — Progress Notes (Signed)
Upon follow-up with the patient today, she had her Jones dressing taken down and found to have some bloody discharge apparent on the superior aspect of her Aquacel bandage.  Her JP drain was pulled without difficulty, intact.  Her Aquacel bandage was replaced with a fresh bandage.  Patient was relatively sedated, vitals were within normal limits.  Believe this sedation to be due to pain medications.  Have requested that pain medications be held until improvement of sedation.  Patient and nursing understands, will continue with Tylenol and Zofran as needed for nausea.  Telemetry order maintained.  Will reevaluate tomorrow morning.  Danise Edge, PA-C [facility]

## 2022-12-02 LAB — GLUCOSE, CAPILLARY
Glucose-Capillary: 87 mg/dL (ref 70–99)
Glucose-Capillary: 167 mg/dL — ABNORMAL HIGH (ref 70–99)
Glucose-Capillary: 186 mg/dL — ABNORMAL HIGH (ref 70–99)
Glucose-Capillary: 109 mg/dL — ABNORMAL HIGH (ref 70–99)

## 2022-12-02 MED ORDER — OXYCODONE HCL 5 MG PO TABS: 5.0000 mg | ORAL_TABLET | ORAL | 0 refills | Status: AC | PRN

## 2022-12-02 MED ORDER — ASPIRIN 81 MG PO CHEW: 81.0000 mg | CHEWABLE_TABLET | Freq: Two times a day (BID) | ORAL | Status: AC

## 2022-12-02 MED ORDER — TRAMADOL HCL 50 MG PO TABS: 50.0000 mg | ORAL_TABLET | ORAL | 0 refills | Status: AC | PRN

## 2022-12-02 NOTE — Progress Notes (Signed)
Occupational Therapy Treatment Patient Details Name: Carol Ramsey MRN: [id number] DOB: 10-May-1945 Today's Date: 12/02/2022   History of present illness Carol Ramsey is a 77yoF who presents to [facility] for elective Rt TKA. PTA pt lives at home with husband, also have support planned from son who works from home. Pt was a household AMB with SPC >1 year, fluent with modified technique for 6 entry stairs   OT comments  Pt presents today with generalized weakness, limited endurance, impaired balance, and 6/10 pain in R LE. She is able to stand with Min A, ambulate slowly with limited weightbearing on R LE and fair balance, with forward flexed posture and heavy reliance on RW. She requires Min A for transfer to<>from BSC, Max A for post-BM hygiene. Pt could benefit from ongoing skilled-level care until her strength, endurance, and balance are improved.    Recommendations for follow up therapy are one component of a multi-disciplinary discharge planning process, led by the attending physician.  Recommendations may be updated based on patient status, additional functional criteria and insurance authorization.    Assistance Recommended at Discharge Frequent or constant Supervision/Assistance  Patient can return home with the following  A lot of help with walking and/or transfers;A lot of help with bathing/dressing/bathroom;Assistance with cooking/housework;Assist for transportation;Help with stairs or ramp for entrance   Equipment Recommendations       Recommendations for Other Services      Precautions / Restrictions Precautions Precautions: Fall;Knee Restrictions Weight Bearing Restrictions: Yes RLE Weight Bearing: Weight bearing as tolerated       Mobility Bed Mobility               General bed mobility comments: NT in recliner pre/post session    Transfers Overall transfer level: Needs assistance Equipment used: Rolling walker (2 wheels) Transfers: Sit to/from Stand Sit to Stand:  Min assist     Step pivot transfers: Min assist     General transfer comment: painful, but no cues needed to perform. Heavy use of BUE, minimal use of surgical leg     Balance Overall balance assessment: Needs assistance Sitting-balance support: Feet supported Sitting balance-Leahy Scale: Good     Standing balance support: Bilateral upper extremity supported, During functional activity, Reliant on assistive device for balance Standing balance-Leahy Scale: Fair Standing balance comment: very slow, deliberate movements in standing, little WBing on R LE                           ADL either performed or assessed with clinical judgement   ADL Overall ADL's : Needs assistance/impaired                         Toilet Transfer: Minimal assistance;BSC/3in1;Stand-pivot   Toileting- Clothing Manipulation and Hygiene: Maximal assistance;Sit to/from stand              Extremity/Trunk Assessment Upper Extremity Assessment Upper Extremity Assessment: Generalized weakness   Lower Extremity Assessment Lower Extremity Assessment: RLE deficits/detail RLE Deficits / Details: s/p R TKA        Vision       Perception     Praxis      Cognition Arousal/Alertness: Awake/alert Behavior During Therapy: WFL for tasks assessed/performed, Flat affect Overall Cognitive Status: Within Functional Limits for tasks assessed  General Comments: slow processing, flat affect        Exercises Other Exercises Other Exercises: Educ re: polar care, AE, safe ADL performance    Shoulder Instructions       General Comments      Pertinent Vitals/ Pain       Pain Assessment Pain Assessment: 0-10 Pain Score: 6  Pain Location: operative knee Pain Descriptors / Indicators: Operative site guarding, Aching Pain Intervention(s): Repositioned, Ice applied, Limited activity within patient's tolerance  Home Living                                           Prior Functioning/Environment              Frequency  Min 1X/week        Progress Toward Goals  OT Goals(current goals can now be found in the care plan section)  Progress towards OT goals: Progressing toward goals  Acute Rehab OT Goals OT Goal Formulation: With patient/family Time For Goal Achievement: 12/15/22 Potential to Achieve Goals: Good  Plan Discharge plan needs to be updated    Co-evaluation                 AM-PAC OT "6 Clicks" Daily Activity     Outcome Measure   Help from another person eating meals?: None Help from another person taking care of personal grooming?: A Little Help from another person toileting, which includes using toliet, bedpan, or urinal?: A Lot Help from another person bathing (including washing, rinsing, drying)?: A Lot Help from another person to put on and taking off regular upper body clothing?: A Little Help from another person to put on and taking off regular lower body clothing?: A Lot 6 Click Score: 16    End of Session Equipment Utilized During Treatment: Rolling walker (2 wheels)  OT Visit Diagnosis: Unsteadiness on feet (R26.81);Other abnormalities of gait and mobility (R26.89)   Activity Tolerance Patient limited by fatigue   Patient Left in chair;with call bell/phone within reach;with family/visitor present   Nurse Communication          Time: 1040-1100 OT Time Calculation (min): 20 min  Charges: OT General Charges $OT Visit: 1 Visit OT Treatments $Self Care/Home Management : 8-22 mins [name], PhD, MS, OTR/L 12/02/22, 11:31 AM

## 2022-12-02 NOTE — Progress Notes (Signed)
Physical Therapy Treatment Patient Details Name: Carol Ramsey MRN: [id number] DOB: [birth year] Today's Date: 12/02/2022   History of Present Illness Carol Ramsey is a 77yoF who presents to [facility] for elective Rt TKA. PTA pt lives at home with husband, also have support planned from son who works from home. Pt was a household AMB with SPC >1 year, fluent with modified technique for 6 entry stairs    PT Comments  Pt still up in chair on return for second session, pain at goal. Resting HR improved compared to earlier (80s-90s bpm). Reviewed HEP items again, pt reports not having tried any since morning PT session. Pt is stiff and sore, but loosen up well and is able to demonstrate excellent ROM still, ~5-80 degrees knee flexion. Pt demos 2 STS transfers and 2 walking bouts, both with safe RW use, both minGuard assist, but AMB is limited by gross fatigue each time, noted HR in 140s with both bouts. Will continue to follow.     If plan is discharge home, recommend the following: A little help with walking and/or transfers;Help with stairs or ramp for entrance;Assist for transportation   Can travel by private vehicle     Yes  Equipment Recommendations  Rolling walker (2 wheels);BSC/3in1    Recommendations for Other Services       Precautions / Restrictions Precautions Precautions: Fall;Knee Precaution Booklet Issued: Yes (comment) Restrictions RLE Weight Bearing: Weight bearing as tolerated     Mobility  Bed Mobility Overal bed mobility: Modified Independent Bed Mobility: Supine to Sit     Supine to sit: Modified independent (Device/Increase time)     General bed mobility comments: no diziness or nausea today    Transfers Overall transfer level: Needs assistance Equipment used: Rolling walker (2 wheels) Transfers: Sit to/from Stand Sit to Stand: Min guard   Step pivot transfers: Min guard       General transfer comment: steadily improving in technique, performance (twice in  session)    Ambulation/Gait Ambulation/Gait assistance: Supervision Gait Distance (Feet): 24 Feet Assistive device: Rolling walker (2 wheels) Gait Pattern/deviations: Step-to pattern       General Gait Details: AMB into hall and stopped once tele monitor was visible, HR 144bpm, pt fatigued; given sit break in recliner, then AMB another 24ft with RW thereafter.   Stairs             Wheelchair Mobility     Tilt Bed    Modified Rankin (Stroke Patients Only)       Balance                                            Cognition Arousal/Alertness: Awake/alert Behavior During Therapy: WFL for tasks assessed/performed Overall Cognitive Status: Within Functional Limits for tasks assessed                                          Exercises Total Joint Exercises Ankle Circles/Pumps: AROM, Both, Supine, 10 reps Quad Sets: AROM, Right, 10 reps, Supine Gluteal Sets: AROM, Right, 10 reps, Supine Short Arc Quad: Right, Supine, AROM, 15 reps Heel Slides: AAROM, Right, Supine, 15 reps Heel Slides Limitations: self assist with bed sheet Hip ABduction/ADduction: AROM, Right, Supine, 15 reps Long Arc Quad: AROM, Right, 5 reps Knee Flexion:  AROM, Right, 5 reps Goniometric ROM: 0-85 degrees    General Comments        Pertinent Vitals/Pain Pain Assessment Pain Assessment: 0-10 Pain Score: 7  Pain Location: operative knee Pain Descriptors / Indicators: Operative site guarding, Aching Pain Intervention(s): Limited activity within patient's tolerance, Monitored during session, Premedicated before session, Repositioned    Home Living                          Prior Function            PT Goals (current goals can now be found in the care plan section) Acute Rehab PT Goals Patient Stated Goal: pain control, nausea control PT Goal Formulation: With patient Time For Goal Achievement: 12/15/22 Potential to Achieve Goals:  Good Progress towards PT goals: Progressing toward goals    Frequency    BID      PT Plan Discharge plan needs to be updated    Co-evaluation              AM-PAC PT "6 Clicks" Mobility   Outcome Measure  Help needed turning from your back to your side while in a flat bed without using bedrails?: None Help needed moving from lying on your back to sitting on the side of a flat bed without using bedrails?: None Help needed moving to and from a bed to a chair (including a wheelchair)?: A Little Help needed standing up from a chair using your arms (e.g., wheelchair or bedside chair)?: A Little Help needed to walk in hospital room?: A Little Help needed climbing 3-5 steps with a railing? : A Lot 6 Click Score: 19    End of Session Equipment Utilized During Treatment: Gait belt Activity Tolerance: Treatment limited secondary to medical complications (Comment);No increased pain Patient left: in chair;with family/visitor present;with call bell/phone within reach;with chair alarm set Nurse Communication:  (elevated HR) PT Visit Diagnosis: Difficulty in walking, not elsewhere classified (R26.2);Other abnormalities of gait and mobility (R26.89);Muscle weakness (generalized) (M62.81)     Time: 9147-8295 PT Time Calculation (min) (ACUTE ONLY): 25 min  Charges:    $Gait Training: 8-22 mins $Therapeutic Exercise: 8-22 mins $Therapeutic Activity: 38-52 mins PT General Charges $$ ACUTE PT VISIT: 1 Visit                    4:08 PM, 12/02/22 [name], PT, DPT Physical Therapist - [facility]  [phone] (ASCOM)    , C 12/02/2022, 4:04 PM

## 2022-12-02 NOTE — [place] FL2 (Signed)
[place] MEDICAID FL2 LEVEL OF CARE FORM     IDENTIFICATION  Patient Name: Carol Ramsey Birthdate: Oct 10, 1945 Sex: female Admission Date (Current Location): 11/30/2022  [facility] [place] Number:  Chiropodist and Address:  [facility], [address] [place], [place] [address]      Provider Number: [id number]  Attending Physician Name and Address:  Donato Heinz, MD  Relative Name and Phone Number:  Destiny Hagin [phone]    Current Level of Care: Hospital Recommended Level of Care: Skilled Nursing Facility Prior Approval Number:    Date Approved/Denied:   PASRR Number: [id number]  Discharge Plan: SNF    Current Diagnoses: Patient Active Problem List   Diagnosis Date Noted   Total knee replacement status 11/30/2022   Edema of lower extremity 10/13/2022   Moderate tricuspid regurgitation 05/25/2020   Primary osteoarthritis of right knee 10/30/2018   Lymphedema 10/30/2018   DDD (degenerative disc disease), lumbar 02/28/2018   Osteoarthritis of sacroiliac joint (Left) 02/28/2018   Somatic dysfunction of sacroiliac joint (Left) 02/28/2018   Sacroiliac joint dysfunction (Left) 02/28/2018   Abnormal MRI, lumbar spine (07/22/2013) 02/28/2018   Lumbar facet syndrome (Bilateral) 02/28/2018   Lumbar spondylosis 02/28/2018   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) 02/28/2018   Lumbar facet arthropathy (Bilateral) 02/28/2018   Lumbar intervertebral disc protrusion 02/28/2018   Lumbar nerve root compression 02/28/2018   Hypoalbuminemia 02/27/2018   Hypomagnesemia 02/27/2018   Vitamin B 12 deficiency 02/27/2018   Low serum vitamin B12 02/06/2018   Chronic low back pain (Primary Area of Pain) (Bilateral) w/ sciatica (Left) 02/01/2018   Chronic lower extremity pain (Secondary Area of Pain) (Left) 02/01/2018   Chronic pain syndrome 02/01/2018   Pharmacologic therapy 02/01/2018   Disorder of skeletal system 02/01/2018   Problems  influencing health status 02/01/2018   Chronic sacroiliac joint pain (Left) 02/01/2018   Bilateral leg edema 02/21/2017   CKD (chronic kidney disease) stage 3, GFR 30-59 ml/min ([name]) 07/21/2016   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   LVH (left ventricular hypertrophy) due to hypertensive disease, without heart failure 01/19/2016   Pulmonary hypertension ([name]) 02/10/2015   Acquired hypothyroidism 09/17/2014   Adaptation reaction 09/17/2014   Absolute anemia 09/17/2014   Cardiac dysrhythmia 09/17/2014   Lumbar facet hypertrophy 09/17/2014   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 09/17/2014   Type 2 diabetes mellitus with stage 3a chronic kidney disease, without long-term current use of insulin ([name]) 09/17/2014   Essential (primary) hypertension 09/17/2014   HLD (hyperlipidemia) 09/17/2014   Extreme obesity 09/17/2014   Vitamin D insufficiency 09/17/2014   Beat, premature ventricular 12/11/2013   OBESITY 11/14/2007   Achilles bursitis or tendinitis 11/14/2007   Calcaneal bursitis (heel), unspecified laterality 06/26/2007    Orientation RESPIRATION BLADDER Height & Weight     Self, Time, Situation, Place  Normal Continent Weight: 78.9 kg Height:  5\' 3"  (160 cm)  BEHAVIORAL SYMPTOMS/MOOD NEUROLOGICAL BOWEL NUTRITION STATUS      Continent  (See Discharge Summary)  AMBULATORY STATUS COMMUNICATION OF NEEDS Skin   Extensive Assist Verbally Surgical wounds (Aquacel Bandage)                       Personal Care Assistance Level of Assistance  Bathing, Feeding, Dressing Bathing Assistance: Maximum assistance Feeding assistance: Limited assistance Dressing Assistance: Maximum assistance     Functional Limitations Info  Sight, Hearing, Speech Sight Info: Impaired (Patient wears glasses)  Hearing Info: Adequate Speech Info: Adequate    SPECIAL CARE FACTORS FREQUENCY  PT (By licensed PT), OT (By licensed OT)     PT Frequency: 5x weekly OT Frequency:  5x weekly            Contractures Contractures Info: Not present    Additional Factors Info  Code Status, Allergies Code Status Info: Full Allergies Info: Gabapentin, Quinine, Amoxicillin, Dilaudid  (Hydromorphone Hcl), Etodolac, Hydrochlorothiazide, Hydrocodone, Hydromorphone, Sulfa Antibiotics           Current Medications (12/02/2022):  This is the current hospital active medication list Current Facility-Administered Medications  Medication Dose Route Frequency Provider Last Rate Last Admin   0.9 %  sodium chloride infusion   Intravenous Continuous Hooten, Illene Labrador, MD   Stopped at 12/02/22 1147   acetaminophen (TYLENOL) tablet 325-650 mg  325-650 mg Oral Q6H PRN Donato Heinz, MD   650 mg at 12/02/22 1012   alum & mag hydroxide-simeth (MAALOX/MYLANTA) 200-200-20 MG/5ML suspension 30 mL  30 mL Oral Q4H PRN Hooten, Illene Labrador, MD       aspirin chewable tablet 81 mg  81 mg Oral BID Donato Heinz, MD   81 mg at 12/02/22 1012   atorvastatin (LIPITOR) tablet 10 mg  10 mg Oral q1800 Hooten, Illene Labrador, MD   10 mg at 12/01/22 1740   bisacodyl (DULCOLAX) suppository 10 mg  10 mg Rectal Daily PRN Hooten, Illene Labrador, MD       dapagliflozin propanediol (FARXIGA) tablet 10 mg  10 mg Oral Daily Hooten, Illene Labrador, MD   10 mg at 12/02/22 1012   diphenhydrAMINE (BENADRYL) 12.5 MG/5ML elixir 12.5-25 mg  12.5-25 mg Oral Q4H PRN Hooten, Illene Labrador, MD       ferrous sulfate tablet 325 mg  325 mg Oral BID WC Hooten, Illene Labrador, MD   325 mg at 12/02/22 1026   furosemide (LASIX) tablet 20 mg  20 mg Oral PRN Hooten, Illene Labrador, MD       glipiZIDE (GLUCOTROL XL) 24 hr tablet 10 mg  10 mg Oral Q breakfast Hooten, Illene Labrador, MD   10 mg at 12/02/22 1012   HYDROmorphone (DILAUDID) injection 0.5-1 mg  0.5-1 mg Intravenous Q4H PRN Donato Heinz, MD   1 mg at 11/30/22 2220   insulin aspart (novoLOG) injection 0-15 Units  0-15 Units Subcutaneous TID WC Hooten, Illene Labrador, MD   3 Units at 12/02/22 1246   insulin aspart (novoLOG)  injection 0-5 Units  0-5 Units Subcutaneous QHS Donato Heinz, MD   2 Units at 11/30/22 2129   insulin glargine-yfgn (SEMGLEE) injection 5 Units  5 Units Subcutaneous QHS Hooten, Illene Labrador, MD   5 Units at 12/01/22 2144   lisinopril (ZESTRIL) tablet 20 mg  20 mg Oral Daily Hooten, Illene Labrador, MD   20 mg at 12/02/22 1013   magnesium hydroxide (MILK OF MAGNESIA) suspension 30 mL  30 mL Oral Daily Hooten, Illene Labrador, MD   30 mL at 12/02/22 1012   menthol-cetylpyridinium (CEPACOL) lozenge 3 mg  1 lozenge Oral PRN Hooten, Illene Labrador, MD       Or   phenol (CHLORASEPTIC) mouth spray 1 spray  1 spray Mouth/Throat PRN Hooten, Illene Labrador, MD       metoCLOPramide (REGLAN) tablet 10 mg  10 mg Oral TID AC & HS Hooten, Illene Labrador, MD   10 mg at 12/02/22 1246   metoprolol succinate (TOPROL-XL) 24 hr tablet 25 mg  25 mg Oral Daily Hooten, Illene Labrador, MD   25 mg at 12/02/22 1012   ondansetron (ZOFRAN) tablet 4 mg  4 mg Oral Q6H PRN Hooten, Illene Labrador, MD       Or   ondansetron (ZOFRAN) injection 4 mg  4 mg Intravenous Q6H PRN Hooten, Illene Labrador, MD   4 mg at 12/01/22 5956   oxyCODONE (Oxy IR/ROXICODONE) immediate release tablet 10 mg  10 mg Oral Q4H PRN Hooten, Illene Labrador, MD       oxyCODONE (Oxy IR/ROXICODONE) immediate release tablet 5 mg  5 mg Oral Q4H PRN Hooten, Illene Labrador, MD   5 mg at 12/01/22 3875   pantoprazole (PROTONIX) EC tablet 40 mg  40 mg Oral BID Donato Heinz, MD   40 mg at 12/02/22 1012   Semaglutide TABS 7 mg  1 tablet Oral Daily Hooten, Illene Labrador, MD       senna-docusate (Senokot-S) tablet 1 tablet  1 tablet Oral BID Hooten, Illene Labrador, MD   1 tablet at 12/02/22 1012   sodium phosphate (FLEET) 7-19 GM/118ML enema 1 enema  1 enema Rectal Once PRN Hooten, Illene Labrador, MD       traMADol Janean Sark) tablet 50-100 mg  50-100 mg Oral Q4H PRN Donato Heinz, MD   50 mg at 12/02/22 1303     Discharge Medications: Please see discharge summary for a list of discharge medications.  Relevant Imaging Results:  Relevant Lab  Results:   Additional Information [id number]  Garret Reddish, RN

## 2022-12-02 NOTE — Plan of Care (Signed)

## 2022-12-02 NOTE — Discharge Summary (Signed)
Physician Discharge Summary  Subjective: 2 Days Post-Op Procedure(s) (LRB): COMPUTER ASSISTED TOTAL KNEE ARTHROPLASTY (Right) Patient reports pain as 3/10 this morning.  States that her right knee is not hurting too bad.  States that her left knee and foot seem to be bothering her more from her TED hose.  Provider was in room during nursing visit.   Patient remains on telemetry on the floor for monitoring of asymptomatic bradycardia. Patient seen in rounds with Dr. Ernest Pine. Patient is  fair, states that her pain is increased, but denies any CP, SOB, fevers or chills.  States that she has not had any feelings of nausea since yesterday. Patient passed PT protocols  Patient is ready to go home  Physician Discharge Summary  Patient ID: Carol Ramsey MRN: [id number] DOB/AGE: [birth year] 77 y.o.  Admit date: 11/30/2022 Discharge date: 12/02/2022  Admission Diagnoses:  Discharge Diagnoses:  Principal Problem:   Total knee replacement status   Discharged Condition: fair  Hospital Course: Patient presented to the hospital on 11/30/2022 for an elective right total knee arthroplasty performed by Dr. Ernest Pine.  Patient was given TXA and Ancef perioperatively.  Patient tolerated surgery well.  Lateral releases and pie crusting of IT band were performed.  See operative details below.  Postoperatively the patient was experiencing some asymptomatic bradycardia, was placed on telemetry and sent to the orthopedic floor for further evaluation and monitoring.  Patient had some difficulty working with physical therapy during postop day 1 and was feeling lightheaded and nauseous.  Patient her postop day 1 was also feeling fatigued and tired, believe this to be due to medications, received Dilaudid over her first night after surgery.  Patient was also having some drainage, JP drain was left in until the afternoon of postop day 1, when it was removed, intact.  Aquacel bandage was changed out given that she had drainage  appreciated at the proximal aspect of her bandage.  Postop day 2, patient was able to get up and tolerate PT interventions, and passed her PT protocols.  There still was some appreciable drainage on her bandage on the proximal aspect, but improved.  Patient's vital signs are stable at rest and up with working with PT.  Patient is stable for discharge.  PROCEDURE:  Right total knee arthroplasty using computer-assisted navigation   SURGEON:  Jena Gauss. M.D.   ASSISTANT:  Gean Birchwood, PA-C (present and scrubbed throughout the case, critical for assistance with exposure, retraction, instrumentation, and closure)   ANESTHESIA: spinal   ESTIMATED BLOOD LOSS: 50 mL   FLUIDS REPLACED: 1200 mL of crystalloid   TOURNIQUET TIME: 96 minutes   DRAINS: 2 medium Hemovac drains   SOFT TISSUE RELEASES: Anterior cruciate ligament, posterior cruciate ligament, deep medial collateral ligament, patellofemoral ligament, posterolateral corner, and pie crusting of the IT band   IMPLANTS UTILIZED: DePuy Attune size 5 posterior stabilized femoral component (cemented), size 5 rotating platform tibial component (cemented), 35 mm medialized dome patella (cemented), and a 7 mm stabilized rotating platform polyethylene insert.  Treatments: None  Discharge Exam: Blood pressure (!) 141/53, pulse 67, temperature 97.8 F (36.6 C), resp. rate 17, height 5\' 3"  (1.6 m), weight 78.9 kg, SpO2 100%.   Disposition: Home   Allergies as of 12/02/2022       Reactions   Gabapentin Swelling   Quinine Nausea Only, Nausea And Vomiting   Amoxicillin Itching, Rash   IgE = 12 (WNL) on 11/22/2022   Dilaudid  [hydromorphone Hcl] Rash  Etodolac Rash   Hydrochlorothiazide Rash   Hydrocodone Rash   Hydromorphone Rash   Sulfa Antibiotics Rash        Medication List     STOP taking these medications    metoprolol succinate 100 MG 24 hr tablet Commonly known as: TOPROL-XL       TAKE these medications     acetaminophen 500 MG tablet Commonly known as: TYLENOL Take 500 mg by mouth every 6 (six) hours as needed for mild pain or moderate pain.   ascorbic acid 500 MG tablet Commonly known as: VITAMIN C Take 500 mg by mouth daily.   aspirin 81 MG chewable tablet Chew 1 tablet (81 mg total) by mouth 2 (two) times daily.   atorvastatin 10 MG tablet Commonly known as: LIPITOR Take 10 mg by mouth daily at 6 PM.   dapagliflozin propanediol 10 MG Tabs tablet Commonly known as: Farxiga Take 1 tablet (10 mg total) by mouth daily. Patient receives through [name] Patient Assistance   furosemide 20 MG tablet Commonly known as: LASIX Take 20 mg by mouth as needed.   glipiZIDE 10 MG 24 hr tablet Commonly known as: GLUCOTROL XL Take 1 tablet (10 mg total) by mouth daily with breakfast. TAKE 1 TABLET BY MOUTH DAILY   insulin glargine 100 UNIT/ML injection Commonly known as: LANTUS Inject 15 Units into the skin as needed.   lisinopril 20 MG tablet Commonly known as: ZESTRIL Take 20 mg by mouth daily. QAM   OneTouch Verio test strip Generic drug: glucose blood TEST BLOOD SUGAR TWICE DAILY AS INSTRUCTED   OVER THE COUNTER MEDICATION cinnamon bark 500 mg 1 tab daily   OVER THE COUNTER MEDICATION Omega XL-supplement   OVER THE COUNTER MEDICATION CALCIUM 600-D3 PLUS, MAG-ZINC   oxyCODONE 5 MG immediate release tablet Commonly known as: Oxy IR/ROXICODONE Take 1 tablet (5 mg total) by mouth every 4 (four) hours as needed for moderate pain (pain score 4-6).   Rybelsus 7 MG Tabs Generic drug: Semaglutide Take 1 tablet by mouth daily.   traMADol 50 MG tablet Commonly known as: ULTRAM Take 1-2 tablets (50-100 mg total) by mouth every 4 (four) hours as needed for moderate pain.   Vitamin B-12 5000 MCG Subl Place 5,000 mcg under the tongue daily.   Vitamin D 125 MCG (5000 UT) Caps Take 5,000 Units by mouth daily.               Durable Medical Equipment  (From admission,  onward)           Start     Ordered   11/30/22 1648  DME Walker rolling  Once       Question:  Patient needs a walker to treat with the following condition  Answer:  Total knee replacement status   11/30/22 1647   11/30/22 1648  DME Bedside commode  Once       Comments: Patient is not able to walk the distance required to go the bathroom, or he/she is unable to safely negotiate stairs required to access the bathroom.  A 3in1 BSC will alleviate this problem  Question:  Patient needs a bedside commode to treat with the following condition  Answer:  Total knee replacement status   11/30/22 1647            Follow-up Information     Rayburn Go, PA-C Follow up on 12/15/2022.   Specialty: Orthopedic Surgery Why: at 10:15am Contact information: [address] [place] [address]  [phone]         Donato Heinz, MD Follow up on 01/19/2023.   Specialty: Orthopedic Surgery Why: at 10:15am Contact information: [address] [facility] [place] [address] [phone]                 Signed: Gean Birchwood 12/02/2022, 8:04 AM   Objective: Vital signs in last 24 hours: Temp:  [97.5 F (36.4 C)-97.9 F (36.6 C)] 97.8 F (36.6 C) (08/01 2330) Pulse Rate:  [53-73] 67 (08/01 2330) Resp:  [16-18] 17 (08/01 2330) BP: (96-141)/(46-73) 141/53 (08/01 2330) SpO2:  [100 %] 100 % (08/01 2330)  Intake/Output from previous day:  Intake/Output Summary (Last 24 hours) at 12/02/2022 0804 Last data filed at 12/02/2022 0147 Gross per 24 hour  Intake 2055.79 ml  Output 1265 ml  Net 790.79 ml    Intake/Output this shift: No intake/output data recorded.  Labs: Recent Labs    11/30/22 0650  HGB 12.9   Recent Labs    11/30/22 0650  HCT 38.0   Recent Labs    11/30/22 0650  NA 133*  K 3.7  CL 98  BUN 14  CREATININE 1.20*  GLUCOSE 156*   No results for input(s): "LABPT", "INR" in the last 72 hours.  EXAM: General - Patient is  Alert, Appropriate, and Oriented, improved sedation from yesterday Extremity - Neurologically intact ABD soft Neurovascular intact Sensation intact distally Intact pulses distally Dorsiflexion/Plantar flexion intact No cellulitis present Compartment soft Dressing -mild drainage appreciated at the proximal aspect of the Aquacel bandage this morning.  Bandage remains in place.  Bandage covering the JP drain site is clean without any apparent signs of drainage. Motor Function - intact, moving foot and toes well on exam.  Patient able to plantar and dorsiflex with good range of motion and strength, 5/5.  Patient is neurovascularly intact down her right lower extremity to all dermatomes.  Posterior tibial pulses appreciated.  Assessment/Plan: 2 Days Post-Op Procedure(s) (LRB): COMPUTER ASSISTED TOTAL KNEE ARTHROPLASTY (Right) Procedure(s) (LRB): COMPUTER ASSISTED TOTAL KNEE ARTHROPLASTY (Right) Past Medical History:  Diagnosis Date   Anemia    Arthritis    Basal cell carcinoma (BCC) of lower leg    Bilateral leg edema    Calcaneal bursitis (heel), unspecified laterality    Chronic pain syndrome    CKD (chronic kidney disease) stage 3, GFR 30-59 ml/min ([name])    COVID-19 04/2021   DDD (degenerative disc disease), lumbar    Diastolic dysfunction    a.) TTE 12/19/2016: EF >55%, mod LVH, triv AR, mild MR/TR/PR, RVSP 57.5; b.) TTE 02/10/2017: EF >55%, mild BAE, mild RVE, triv PR, mild MR, mod TR, RVSP 69.9, G1DD; c.) TTE 07/21/2022: EF >55%, triv MR, mild TR, RVSP 40.3, G1DD   DOE (dyspnea on exertion)    Ductal carcinoma in situ (DCIS) of right breast 2018   a.) s/p partial mastectomy + adjuvant XRT   Heart murmur    History of bilateral cataract extraction    Hyperlipidemia    Hypertension    Hypoalbuminemia    Hypomagnesemia    Hypothyroidism    Low serum vitamin B12    Lumbar facet arthropathy    Lumbar foraminal stenosis    Lumbar nerve root compression    Lumbar spondylosis     Lymphedema    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    OA (osteoarthritis)    Obesity    Protrusion of lumbar intervertebral disc  Pulmonary hypertension ([name])    a.) TTE 12/19/2016: RVSP 57.5; b.) TTE 02/10/2017: RVSP 69.9; c.) TTE 07/21/2022: RVSP 40.3   PVC's (premature ventricular contractions)    Sacroiliac joint dysfunction of left side    T2DM (type 2 diabetes mellitus) ([name])    Vitamin D deficiency    Principal Problem:   Total knee replacement status  Estimated body mass index is 30.81 kg/m as calculated from the following:   Height as of this encounter: 5\' 3"  (1.6 m).   Weight as of this encounter: 78.9 kg. Advance diet Up with therapy  Patient has passed her PT protocols with stable vital signs.  Patient will look to transition to home health physical therapy.  Will transition to outpatient physical therapy at 2 weeks.   Discussed with the patient continuing to utilize [employer]   Patient will use bone foam in 20-30 minute intervals   Patient will wear TED hose bilaterally to help prevent DVT and clot formation   Discussed the Aquacel bandage.  This bandage will stay in place 7 days postoperatively.  Can be replaced with honeycomb bandages that will be sent home with the patient, especially if she continues to have output/drainage appreciated   Discussed sending the patient home with tramadol and oxycodone for as needed pain management.  Patient will take an 81 mg aspirin twice daily for DVT prophylaxis   Weight-Bearing as tolerated to right leg   Patient will plan to follow-up with [facility] clinic orthopedics in 2 weeks for staple removal and reevaluation  Diet - Diabetic diet Follow up - in 2 weeks Activity - WBAT Disposition - Home Condition Upon Discharge - Fair DVT Prophylaxis - Aspirin and TED hose  Danise Edge, PA-C Orthopaedic Surgery 12/02/2022, 8:04 AM

## 2022-12-02 NOTE — Progress Notes (Signed)
Subjective: 2 Days Post-Op Procedure(s) (LRB): COMPUTER ASSISTED TOTAL KNEE ARTHROPLASTY (Right) Patient reports pain as 3/10 this morning.  States that her right knee is not hurting too bad.  States that her left knee and foot seem to be bothering her more from her TED hose.  Provider was in room during nursing visit.   Patient remains on telemetry on the floor for monitoring of asymptomatic bradycardia. Patient seen in rounds with Dr. Ernest Pine. Patient is  fair, states that her pain is increased, but denies any CP, SOB, fevers or chills.  States that she has not had any feelings of nausea since yesterday. We will continue to progress with therapy today.  Plan is to go Home after hospital stay.  Objective: Vital signs in last 24 hours: Temp:  [97.5 F (36.4 C)-97.9 F (36.6 C)] 97.8 F (36.6 C) (08/01 2330) Pulse Rate:  [53-73] 67 (08/01 2330) Resp:  [16-18] 17 (08/01 2330) BP: (96-144)/(46-73) 141/53 (08/01 2330) SpO2:  [100 %] 100 % (08/01 2330)  Intake/Output from previous day:  Intake/Output Summary (Last 24 hours) at 12/02/2022 0735 Last data filed at 12/02/2022 0147 Gross per 24 hour  Intake 2055.79 ml  Output 1425 ml  Net 630.79 ml    Intake/Output this shift: No intake/output data recorded.  Labs: Recent Labs    11/30/22 0650  HGB 12.9   Recent Labs    11/30/22 0650  HCT 38.0   Recent Labs    11/30/22 0650  NA 133*  K 3.7  CL 98  BUN 14  CREATININE 1.20*  GLUCOSE 156*   No results for input(s): "LABPT", "INR" in the last 72 hours.  EXAM General - Patient is Alert, Appropriate, and Oriented Extremity - Neurologically intact ABD soft Neurovascular intact Sensation intact distally Intact pulses distally Dorsiflexion/Plantar flexion intact No cellulitis present Compartment soft Dressing -mild drainage appreciated at the proximal aspect of the Aquacel bandage this morning.  Bandage remains in place.  Bandage covering the JP drain site is clean without  any apparent signs of drainage. Motor Function - intact, moving foot and toes well on exam.  Patient able to plantar and dorsiflex with good range of motion and strength, 5/5.  Patient is neurovascularly intact down her right lower extremity to all dermatomes.  Posterior tibial pulses appreciated.   Past Medical History:  Diagnosis Date   Anemia    Arthritis    Basal cell carcinoma (BCC) of lower leg    Bilateral leg edema    Calcaneal bursitis (heel), unspecified laterality    Chronic pain syndrome    CKD (chronic kidney disease) stage 3, GFR 30-59 ml/min ([name])    COVID-19 04/2021   DDD (degenerative disc disease), lumbar    Diastolic dysfunction    a.) TTE 12/19/2016: EF >55%, mod LVH, triv AR, mild MR/TR/PR, RVSP 57.5; b.) TTE 02/10/2017: EF >55%, mild BAE, mild RVE, triv PR, mild MR, mod TR, RVSP 69.9, G1DD; c.) TTE 07/21/2022: EF >55%, triv MR, mild TR, RVSP 40.3, G1DD   DOE (dyspnea on exertion)    Ductal carcinoma in situ (DCIS) of right breast 2018   a.) s/p partial mastectomy + adjuvant XRT   Heart murmur    History of bilateral cataract extraction    Hyperlipidemia    Hypertension    Hypoalbuminemia    Hypomagnesemia    Hypothyroidism    Low serum vitamin B12    Lumbar facet arthropathy    Lumbar foraminal stenosis    Lumbar nerve root  compression    Lumbar spondylosis    Lymphedema    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    OA (osteoarthritis)    Obesity    Protrusion of lumbar intervertebral disc    Pulmonary hypertension ([name])    a.) TTE 12/19/2016: RVSP 57.5; b.) TTE 02/10/2017: RVSP 69.9; c.) TTE 07/21/2022: RVSP 40.3   PVC's (premature ventricular contractions)    Sacroiliac joint dysfunction of left side    T2DM (type 2 diabetes mellitus) ([name])    Vitamin D deficiency     Assessment/Plan: 2 Days Post-Op Procedure(s) (LRB): COMPUTER ASSISTED TOTAL KNEE ARTHROPLASTY (Right) Principal Problem:   Total knee replacement status  Estimated body mass  index is 30.81 kg/m as calculated from the following:   Height as of this encounter: 5\' 3"  (1.6 m).   Weight as of this encounter: 78.9 kg. Advance diet Up with therapy  Patient will continue to work with physical therapy to pass postoperative PT protocols, ROM and strengthening.  Continue to monitor blood pressure and heart rate with physical therapy interventions  Continue on telemetry for monitor of heart rate  Discussed with the patient continuing to utilize Polar Care  Patient will use bone foam in 20-30 minute intervals  Patient will wear TED hose bilaterally to help prevent DVT and clot formation  Discussed the Aquacel bandage.  This bandage will stay in place 7 days postoperatively.  Can be replaced with honeycomb bandages that will be sent home with the patient  Discussed sending the patient home with tramadol and oxycodone for as needed pain management.  Patient will also be sent home with Celebrex to help with swelling and inflammation.  Patient will take an 81 mg aspirin twice daily for DVT prophylaxis  JP drain and Jones dressing removed yesterday, patient still experiencing some drainage although improved around her Aquacel bandage.  Weight-Bearing as tolerated to right leg  Patient will plan to follow-up with [facility] clinic orthopedics in 2 weeks for staple removal and reevaluation  Rayburn Go, PA-C [facility] 12/02/2022, 7:35 AM

## 2022-12-02 NOTE — TOC Transition Note (Signed)
Transition of Care [facility]) - CM/SW Discharge Note   Patient Details  Name: Carol Ramsey MRN: [id number] Date of Birth: February 23, 1946  [facility]) CM/SW Contact:  Garret Reddish, RN Phone Number: 12/02/2022, 10:48 AM   Clinical Narrative:   Chart reviewed.  Patient was admitted with Total knee replacement.  Noted that patient had a Total Knee Arthroplasty of right knee.    I have spoken with Mrs. Danielski at bedside today.  She would like to discharge to SNF prior to going home. I have explained the SNF process.  Mrs. Graham reports that she has been to [facility] before for short term rehab.  I have informed her that [facility] is under new management and only accept residents from their community.  Mrs. Meinhart was agreeable to Bed Search in the [place] area facilities.   Patient reports that she has a rolling walker and a BSC at home.    TOC will continue to follow for discharge planning.   Final next level of care: Home w Home Health Services Barriers to Discharge: No Barriers Identified   Patient Goals and CMS Choice CMS Medicare.gov Compare Post Acute Care list provided to:: Patient Choice offered to / list presented to : Patient  Discharge Placement                    Name of family member notified: Aljean Horiuchi Patient and family notified of of transfer: 12/02/22  Discharge Plan and Services Additional resources added to the After Visit Summary for                  DME Arranged:  (Patient reports that she has all needed DME)         HH Arranged: PT, OT HH Agency: [facility] (Patient is pre-arranged with [facility]) Date HH Agency Contacted: 12/02/22   Representative spoke with at [facility] Agency: [place]  Social Determinants of Health (SDOH) Interventions SDOH Screenings   Food Insecurity: Food Insecurity Present (11/30/2022)  Housing: Low Risk  (11/30/2022)  Transportation Needs: No Transportation Needs (11/30/2022)  Utilities: Not At Risk  (11/30/2022)  Alcohol Screen: Low Risk  (06/08/2022)  Depression (PHQ2-9): Low Risk  (06/08/2022)  Financial Resource Strain: Low Risk  (06/08/2022)  Physical Activity: Inactive (06/08/2022)  Social Connections: Socially Isolated (06/08/2022)  Stress: Stress Concern Present (06/08/2022)  Tobacco Use: Low Risk  (11/30/2022)     Readmission Risk Interventions     No data to display

## 2022-12-02 NOTE — Progress Notes (Signed)
Physical Therapy Treatment Patient Details Name: Carol Ramsey MRN: [id number] DOB: December 21, 1945 Today's Date: 12/02/2022   History of Present Illness Carol Ramsey is a 77yoF who presents to [facility] for elective Rt TKA. PTA pt lives at home with husband, also have support planned from son who works from home. Pt was a household AMB with SPC >1 year, fluent with modified technique for 6 entry stairs    PT Comments  Pt in bed on arrival, agreeable to session, appears more lively this date. HR on arrival in 110s to 120s, per RN has not gotten BB therapy yet. HEP reviewed in greater detail with handout. Pain is at goal, ROM much better tolerated compared to previous day. Pt still requires heavy  effort to perform STS transfers, very slight minA from EOB, supervision level from [facility]. Pt AMB for first time today to BR and back, HR in 150s, CCMD calls during session. Pt assisted back to bed, then recliner, set up for visit with family. Will plan to advance AMB in PM session pending vitals. Also needs to commence stair training.      If plan is discharge home, recommend the following: A little help with walking and/or transfers;Help with stairs or ramp for entrance;Assist for transportation   Can travel by private vehicle     Yes  Equipment Recommendations  Rolling walker (2 wheels);BSC/3in1    Recommendations for Other Services       Precautions / Restrictions Precautions Precautions: Fall;Knee Precaution Booklet Issued: Yes (comment) Restrictions Weight Bearing Restrictions: Yes RLE Weight Bearing: Weight bearing as tolerated     Mobility  Bed Mobility Overal bed mobility: Modified Independent Bed Mobility: Supine to Sit     Supine to sit: Modified independent (Device/Increase time)     General bed mobility comments: no diziness or nausea today    Transfers Overall transfer level: Needs assistance Equipment used: Rolling walker (2 wheels) Transfers: Sit to/from Stand Sit to Stand:  Min guard   Step pivot transfers: Min guard       General transfer comment: heavy effort, but able to perform from EOB and from toilet in session.    Ambulation/Gait Ambulation/Gait assistance: Supervision Gait Distance (Feet): 12 Feet Assistive device: Rolling walker (2 wheels)         General Gait Details: AMB from EOB To bathroom toilet; HR 154bpm (BB therapy scheduled but not received)   Stairs             Wheelchair Mobility     Tilt Bed    Modified Rankin (Stroke Patients Only)       Balance                                            Cognition Arousal/Alertness: Awake/alert Behavior During Therapy: WFL for tasks assessed/performed Overall Cognitive Status: Within Functional Limits for tasks assessed                                          Exercises Total Joint Exercises Ankle Circles/Pumps: AROM, Both, Supine, 10 reps Quad Sets: AROM, Right, 10 reps, Supine Gluteal Sets: AROM, Right, 10 reps, Supine Short Arc Quad: Right, 10 reps, Supine, AROM Heel Slides: AAROM, Right, Supine, Limitations, 15 reps Heel Slides Limitations: self assist with bed sheet  Hip ABduction/ADduction: AROM, Right, 10 reps, Supine Long Arc Quad: AROM, Right, 5 reps Knee Flexion: AROM, Right, 5 reps Goniometric ROM: 0-85 degrees    General Comments        Pertinent Vitals/Pain Pain Assessment Pain Assessment: 0-10 Pain Score: 7  Pain Location: operative knee Pain Descriptors / Indicators: Operative site guarding, Aching Pain Intervention(s): Limited activity within patient's tolerance, Monitored during session, Premedicated before session, Repositioned    Home Living                          Prior Function            PT Goals (current goals can now be found in the care plan section) Acute Rehab PT Goals Patient Stated Goal: pain control, nausea control PT Goal Formulation: With patient Time For Goal  Achievement: 12/15/22 Potential to Achieve Goals: Good Progress towards PT goals: Progressing toward goals    Frequency    BID      PT Plan Discharge plan needs to be updated    Co-evaluation              AM-PAC PT "6 Clicks" Mobility   Outcome Measure  Help needed turning from your back to your side while in a flat bed without using bedrails?: None Help needed moving from lying on your back to sitting on the side of a flat bed without using bedrails?: None Help needed moving to and from a bed to a chair (including a wheelchair)?: A Lot Help needed standing up from a chair using your arms (e.g., wheelchair or bedside chair)?: A Lot Help needed to walk in hospital room?: A Little Help needed climbing 3-5 steps with a railing? : A Lot 6 Click Score: 17    End of Session Equipment Utilized During Treatment: Gait belt Activity Tolerance: Treatment limited secondary to medical complications (Comment);No increased pain (HR 150s after AMB to BR) Patient left: in chair;with family/visitor present;with call bell/phone within reach;with chair alarm set Nurse Communication:  (elevated HR) PT Visit Diagnosis: Difficulty in walking, not elsewhere classified (R26.2);Other abnormalities of gait and mobility (R26.89);Muscle weakness (generalized) (M62.81)     Time: 3086-5784 PT Time Calculation (min) (ACUTE ONLY): 43 min  Charges:    $Therapeutic Activity: 38-52 mins PT General Charges $$ ACUTE PT VISIT: 1 Visit                    1:05 PM, 12/02/22 [name], PT, DPT Physical Therapist - [facility]  [phone] (ASCOM)    , C 12/02/2022, 12:59 PM

## 2022-12-03 LAB — GLUCOSE, CAPILLARY
Glucose-Capillary: 114 mg/dL — ABNORMAL HIGH (ref 70–99)
Glucose-Capillary: 185 mg/dL — ABNORMAL HIGH (ref 70–99)
Glucose-Capillary: 85 mg/dL (ref 70–99)
Glucose-Capillary: 134 mg/dL — ABNORMAL HIGH (ref 70–99)

## 2022-12-03 MED ORDER — LACTULOSE 10 GM/15ML PO SOLN: 20.0000 g | Freq: Every day | ORAL | Status: DC | PRN

## 2022-12-03 NOTE — Progress Notes (Signed)
Pt refused lactulose. Pt expressed she does not want to end up having diarrhea. Offered and provided prune juice at this time.

## 2022-12-03 NOTE — TOC Progression Note (Signed)
Transition of Care [facility]) - Progression Note    Patient Details  Name: Carol Ramsey MRN: [id number] Date of Birth: Jul 25, 1945  [facility]) CM/SW Contact  Garret Reddish, RN Phone Number: 12/03/2022, 10:04 AM  Clinical Narrative:   Chart reviewed.  Continue to awaiting bed offers for SNF placement.  TOC will continue to follow progress of patient.        Barriers to Discharge: No Barriers Identified  Expected Discharge Plan and Services         Expected Discharge Date: 12/02/22               DME Arranged:  (Patient reports that she has all needed DME)         HH Arranged: PT, OT HH Agency: [facility] (Patient is pre-arranged with [facility]) Date HH Agency Contacted: 12/02/22   Representative spoke with at [facility] Agency: [place]   Social Determinants of Health (SDOH) Interventions SDOH Screenings   Food Insecurity: Food Insecurity Present (11/30/2022)  Housing: Low Risk  (11/30/2022)  Transportation Needs: No Transportation Needs (11/30/2022)  Utilities: Not At Risk (11/30/2022)  Alcohol Screen: Low Risk  (06/08/2022)  Depression (PHQ2-9): Low Risk  (06/08/2022)  Financial Resource Strain: Low Risk  (06/08/2022)  Physical Activity: Inactive (06/08/2022)  Social Connections: Socially Isolated (06/08/2022)  Stress: Stress Concern Present (06/08/2022)  Tobacco Use: Low Risk  (11/30/2022)    Readmission Risk Interventions     No data to display

## 2022-12-03 NOTE — Plan of Care (Signed)
  Problem: Activity: Goal: Ability to avoid complications of mobility impairment will improve Outcome: Progressing   Problem: Pain Management: Goal: Pain level will decrease with appropriate interventions Outcome: Progressing   Problem: Skin Integrity: Goal: Will show signs of wound healing Outcome: Progressing   Problem: Education: Goal: Knowledge of General Education information will improve Description: Including pain rating scale, medication(s)/side effects and non-pharmacologic comfort measures Outcome: Progressing   Problem: Activity: Goal: Risk for activity intolerance will decrease Outcome: Progressing   Problem: Safety: Goal: Ability to remain free from injury will improve Outcome: Progressing

## 2022-12-03 NOTE — Progress Notes (Signed)
Physical Therapy Treatment Patient Details Name: Carol Ramsey MRN: [id number] DOB: 1946/04/10 Today's Date: 12/03/2022   History of Present Illness Carol Ramsey is a 77yoF who presents to [facility] for elective Rt TKA. PTA pt lives at home with husband, also have support planned from son who works from home. Pt was a household AMB with SPC >1 year, fluent with modified technique for 6 entry stairs    PT Comments  Pt is progressing with mobility demonstrating steady balance with tranfers, gait, and functional standing activities with RW at min guard to close supervision level. Pt ambulated recliner<>bathroom. Seated Resting HR 107bpm, after walking HR 153bpm. RN notified and RN stated that pt had not had medication yet this morning and was coming to give them to her which would help her HR.  Continued PT will assist pt towards greater R LE strength/ROM and independence with functional mobility.   If plan is discharge home, recommend the following: A little help with walking and/or transfers;Help with stairs or ramp for entrance;Assist for transportation   Can travel by private vehicle     Yes  Equipment Recommendations  Rolling walker (2 wheels);BSC/3in1    Recommendations for Other Services       Precautions / Restrictions Precautions Precautions: Fall;Knee Precaution Booklet Issued: Yes (comment) Restrictions Weight Bearing Restrictions: No RLE Weight Bearing: Weight bearing as tolerated     Mobility  Bed Mobility               General bed mobility comments: Pt up in recliner.    Transfers Overall transfer level: Needs assistance Equipment used: Rolling walker (2 wheels) Transfers: Sit to/from Stand, Bed to chair/wheelchair/BSC Sit to Stand: Independent   Step pivot transfers: Min guard       General transfer comment: slow but steady, safe hand positioning and RW management.    Ambulation/Gait Ambulation/Gait assistance: Supervision Gait Distance (Feet): 20  Feet Assistive device: Rolling walker (2 wheels) Gait Pattern/deviations: Step-to pattern Gait velocity: decreased     General Gait Details: ambulated recliner<>bathroom.  Resting HR 107, after walking HR 153. RN notified and RN stated that pt had not had medication yet this morning and was coming to give them to her which would help her HR.   Stairs Stairs: Yes       General stair comments: NT due to high HR.   Wheelchair Mobility     Tilt Bed    Modified Rankin (Stroke Patients Only)       Balance Overall balance assessment: Needs assistance Sitting-balance support: Feet supported Sitting balance-Leahy Scale: Good     Standing balance support: Bilateral upper extremity supported, During functional activity, Reliant on assistive device for balance Standing balance-Leahy Scale: Fair Standing balance comment: very slow, deliberate movements in standing                            Cognition Arousal/Alertness: Awake/alert Behavior During Therapy: WFL for tasks assessed/performed                                            Exercises Other Exercises Other Exercises: Seated AAROM for R knee: -2-80 degrees.    General Comments        Pertinent Vitals/Pain Pain Assessment Pain Score: 8  Pain Location: operative knee, and back Pain Descriptors / Indicators: Operative site guarding,  Aching Pain Intervention(s): Monitored during session    Home Living                          Prior Function            PT Goals (current goals can now be found in the care plan section) Acute Rehab PT Goals Patient Stated Goal: pain control, nausea control PT Goal Formulation: With patient Time For Goal Achievement: 12/15/22 Potential to Achieve Goals: Good Progress towards PT goals: Progressing toward goals    Frequency    BID      PT Plan Current plan remains appropriate    Co-evaluation              AM-PAC PT "6  Clicks" Mobility   Outcome Measure  Help needed turning from your back to your side while in a flat bed without using bedrails?: None Help needed moving from lying on your back to sitting on the side of a flat bed without using bedrails?: None Help needed moving to and from a bed to a chair (including a wheelchair)?: A Little Help needed standing up from a chair using your arms (e.g., wheelchair or bedside chair)?: A Little Help needed to walk in hospital room?: A Little Help needed climbing 3-5 steps with a railing? : A Lot 6 Click Score: 19    End of Session Equipment Utilized During Treatment: Gait belt Activity Tolerance: Treatment limited secondary to medical complications (Comment);No increased pain (high increase in HR.) Patient left: in chair;with family/visitor present;with call bell/phone within reach;with chair alarm set   PT Visit Diagnosis: Difficulty in walking, not elsewhere classified (R26.2);Other abnormalities of gait and mobility (R26.89);Muscle weakness (generalized) (M62.81)     Time: 5621-3086 PT Time Calculation (min) (ACUTE ONLY): 27 min  Charges:    $Therapeutic Activity: 23-37 mins PT General Charges $$ ACUTE PT VISIT: 1 Visit                     Hortencia Conradi, PTA  12/03/22, 9:26 AM

## 2022-12-03 NOTE — Progress Notes (Signed)
Physical Therapy Treatment Patient Details Name: Carol Ramsey MRN: [id number] DOB: Apr 24, 1946 Today's Date: 12/03/2022   History of Present Illness Carol Ramsey is a 77yoF who presents to [facility] for elective Rt TKA. PTA pt lives at home with husband, also have support planned from son who works from home. Pt was a household AMB with SPC >1 year, fluent with modified technique for 6 entry stairs    PT Comments  Pt making gradual progress with mobility performing transfers with supervision and gait with RW (50') at min guard level.  This pm session, pt had greater R LE step length during gait. Resting HR 87bpm, after gait H R 135bpm; pt had no SOB. Continued PT will assist pt towards greater functional mobility and independence.   If plan is discharge home, recommend the following: A little help with walking and/or transfers;Help with stairs or ramp for entrance;Assist for transportation   Can travel by private vehicle     Yes  Equipment Recommendations       Recommendations for Other Services       Precautions / Restrictions Precautions Precautions: Fall;Knee Restrictions Weight Bearing Restrictions: No RLE Weight Bearing: Weight bearing as tolerated     Mobility  Bed Mobility   Bed Mobility: Sit to Supine       Sit to supine: Mod assist   General bed mobility comments: Mod A to manage LEs    Transfers Overall transfer level: Needs assistance Equipment used: Rolling walker (2 wheels) Transfers: Sit to/from Stand, Bed to chair/wheelchair/BSC Sit to Stand: Supervision   Step pivot transfers: Supervision       General transfer comment: slow but steady, safe hand positioning and RW management.    Ambulation/Gait Ambulation/Gait assistance: Min guard Gait Distance (Feet): 50 Feet Assistive device: Rolling walker (2 wheels) Gait Pattern/deviations: Step-through pattern Gait velocity: decreased     General Gait Details: greater R LE step length.  Resting HR 87bpm,  after gait H R 135bpm; pt had no SOB.   Stairs Stairs: Yes       General stair comments: NT due to high HR.   Wheelchair Mobility     Tilt Bed    Modified Rankin (Stroke Patients Only)       Balance Overall balance assessment: Needs assistance Sitting-balance support: Feet supported Sitting balance-Leahy Scale: Good     Standing balance support: Bilateral upper extremity supported, During functional activity, Reliant on assistive device for balance Standing balance-Leahy Scale: Fair Standing balance comment: very slow, deliberate movements in standing                            Cognition Arousal/Alertness: Awake/alert Behavior During Therapy: WFL for tasks assessed/performed Overall Cognitive Status: Within Functional Limits for tasks assessed                                          Exercises Other Exercises Other Exercises: Seated AAROM for R knee: -2-80 degrees.    General Comments        Pertinent Vitals/Pain Pain Assessment Pain Score: 5  Pain Location: operative knee Pain Descriptors / Indicators: Operative site guarding, Aching Pain Intervention(s): Monitored during session    Home Living                          Prior  Function            PT Goals (current goals can now be found in the care plan section) Acute Rehab PT Goals Patient Stated Goal: pain control, nausea control PT Goal Formulation: With patient Time For Goal Achievement: 12/15/22 Potential to Achieve Goals: Good Progress towards PT goals: Progressing toward goals    Frequency    BID      PT Plan      Co-evaluation              AM-PAC PT "6 Clicks" Mobility   Outcome Measure  Help needed turning from your back to your side while in a flat bed without using bedrails?: None Help needed moving from lying on your back to sitting on the side of a flat bed without using bedrails?: None Help needed moving to and from a bed to  a chair (including a wheelchair)?: A Little   Help needed to walk in hospital room?: A Little Help needed climbing 3-5 steps with a railing? : A Lot 6 Click Score: 16    End of Session Equipment Utilized During Treatment: Gait belt Activity Tolerance: Patient tolerated treatment well Patient left: in bed;with bed alarm set;with family/visitor present Nurse Communication: Mobility status PT Visit Diagnosis: Difficulty in walking, not elsewhere classified (R26.2);Other abnormalities of gait and mobility (R26.89);Muscle weakness (generalized) (M62.81)     Time: 2956-2130 PT Time Calculation (min) (ACUTE ONLY): 17 min  Charges:    $Gait Training: 8-22 mins PT General Charges $$ ACUTE PT VISIT: 1 Visit                    Hortencia Conradi, PTA  12/03/22, 1:48 PM

## 2022-12-03 NOTE — Progress Notes (Signed)
Subjective: 3 Days Post-Op Procedure(s) (LRB): COMPUTER ASSISTED TOTAL KNEE ARTHROPLASTY (Right) Patient reports mild pain this AM.  Eating breakfast. Patient has been experiencing asymptomatic bradycardia.  Tele d/c yesterday per Dr. Ernest Pine. We will continue to progress with therapy today.  Plan is to go Skilled nursing facility after hospital stay. She reports she has not had a BM yet.  Objective: Vital signs in last 24 hours: Temp:  [98.1 F (36.7 C)-98.6 F (37 C)] 98.4 F (36.9 C) (08/03 0745) Pulse Rate:  [88-103] 100 (08/03 0745) Resp:  [17-18] 18 (08/03 0745) BP: (128-157)/(64-73) 156/73 (08/03 0745) SpO2:  [100 %] 100 % (08/03 0745)  Intake/Output from previous day:  Intake/Output Summary (Last 24 hours) at 12/03/2022 0838 Last data filed at 12/02/2022 1851 Gross per 24 hour  Intake 480 ml  Output 700 ml  Net -220 ml    Intake/Output this shift: No intake/output data recorded.  Labs: No results for input(s): "HGB" in the last 72 hours.  No results for input(s): "WBC", "RBC", "HCT", "PLT" in the last 72 hours.  No results for input(s): "NA", "K", "CL", "CO2", "BUN", "CREATININE", "GLUCOSE", "CALCIUM" in the last 72 hours.  No results for input(s): "LABPT", "INR" in the last 72 hours.  EXAM General - Patient is Alert, Appropriate, and Oriented Extremity - Neurologically intact ABD soft Neurovascular intact Sensation intact distally Intact pulses distally Dorsiflexion/Plantar flexion intact No cellulitis present Compartment soft Dressing -mild drainage appreciated at the proximal aspect of the Aquacel bandage this morning.  Bandage covering the JP drain site is clean without any apparent signs of drainage. Motor Function - intact, moving foot and toes well on exam.  Patient able to plantar and dorsiflex with good range of motion and strength, 5/5.  Patient is neurovascularly intact down her right lower extremity to all dermatomes.  Posterior tibial pulses  appreciated.   Past Medical History:  Diagnosis Date   Anemia    Arthritis    Basal cell carcinoma (BCC) of lower leg    Bilateral leg edema    Calcaneal bursitis (heel), unspecified laterality    Chronic pain syndrome    CKD (chronic kidney disease) stage 3, GFR 30-59 ml/min ([name])    COVID-19 04/2021   DDD (degenerative disc disease), lumbar    Diastolic dysfunction    a.) TTE 12/19/2016: EF >55%, mod LVH, triv AR, mild MR/TR/PR, RVSP 57.5; b.) TTE 02/10/2017: EF >55%, mild BAE, mild RVE, triv PR, mild MR, mod TR, RVSP 69.9, G1DD; c.) TTE 07/21/2022: EF >55%, triv MR, mild TR, RVSP 40.3, G1DD   DOE (dyspnea on exertion)    Ductal carcinoma in situ (DCIS) of right breast 2018   a.) s/p partial mastectomy + adjuvant XRT   Heart murmur    History of bilateral cataract extraction    Hyperlipidemia    Hypertension    Hypoalbuminemia    Hypomagnesemia    Hypothyroidism    Low serum vitamin B12    Lumbar facet arthropathy    Lumbar foraminal stenosis    Lumbar nerve root compression    Lumbar spondylosis    Lymphedema    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    OA (osteoarthritis)    Obesity    Protrusion of lumbar intervertebral disc    Pulmonary hypertension ([name])    a.) TTE 12/19/2016: RVSP 57.5; b.) TTE 02/10/2017: RVSP 69.9; c.) TTE 07/21/2022: RVSP 40.3   PVC's (premature ventricular contractions)    Sacroiliac joint dysfunction of left  side    T2DM (type 2 diabetes mellitus) ([name])    Vitamin D deficiency     Assessment/Plan: 3 Days Post-Op Procedure(s) (LRB): COMPUTER ASSISTED TOTAL KNEE ARTHROPLASTY (Right) Principal Problem:   Total knee replacement status  Estimated body mass index is 30.81 kg/m as calculated from the following:   Height as of this encounter: 5\' 3"  (1.6 m).   Weight as of this encounter: 78.9 kg. Advance diet Up with therapy  Vitals reviewed this AM.  No longer on telemetry. Up with therapy today.  Currently plan is for d/c to SNF  however will continue to see how the patient performs with PT. Continue to work on BM.  Lactulose added to regimen.  Weight-Bearing as tolerated to right leg  Patient will plan to follow-up with [facility] orthopedics in 2 weeks for staple removal and reevaluation  J. Horris Latino, PA-C [facility] 12/03/2022, 8:38 AM

## 2022-12-04 LAB — GLUCOSE, CAPILLARY
Glucose-Capillary: 116 mg/dL — ABNORMAL HIGH (ref 70–99)
Glucose-Capillary: 161 mg/dL — ABNORMAL HIGH (ref 70–99)
Glucose-Capillary: 233 mg/dL — ABNORMAL HIGH (ref 70–99)
Glucose-Capillary: 68 mg/dL — ABNORMAL LOW (ref 70–99)
Glucose-Capillary: 77 mg/dL (ref 70–99)

## 2022-12-04 NOTE — Progress Notes (Signed)
Physical Therapy Treatment Patient Details Name: Carol Ramsey MRN: [id number] DOB: 11-Dec-1945 Today's Date: 12/04/2022   History of Present Illness Carol Ramsey is a 77yoF who presents to [facility] for elective Rt TKA. PTA pt lives at home with husband, also have support planned from son who works from home. Pt was a household AMB with SPC >1 year, fluent with modified technique for 6 entry stairs    PT Comments  Pt ready for session.  She declined offer for pain meds prior to session.  Stand with contact guard/supervision and is able to walk to bathroom with slow steady gait to void.  Manages her own self care.  Is able to progress gait 60' with RW and slow gait with contact guard and several self initiated rest breaks.  Reports arm fatigue.  Pediatric walker is brought to room for her to try tomorrow as kyphosis does limit her ability to stand fully upright.  Seated AROM and stretching.  Returns to supine with min a x 1 for LE.    HR at rest 102 HR after gait 155 HR after 2 minutes rest 118 Pt did receive cardiac meds 30 minutes prior to start of session.  Stated she does not feel increase in HR or other symtoms    If plan is discharge home, recommend the following: A little help with walking and/or transfers;Help with stairs or ramp for entrance;Assist for transportation   Can travel by private vehicle        Equipment Recommendations  Rolling walker (2 wheels);BSC/3in1    Recommendations for Other Services       Precautions / Restrictions Precautions Precautions: Fall;Knee Restrictions Weight Bearing Restrictions: No RLE Weight Bearing: Weight bearing as tolerated     Mobility  Bed Mobility Overal bed mobility: Needs Assistance Bed Mobility: Sit to Supine       Sit to supine: Min assist   General bed mobility comments: for LE's Patient Response: Cooperative  Transfers Overall transfer level: Needs assistance Equipment used: Rolling walker (2 wheels) Transfers: Sit  to/from Stand Sit to Stand: Supervision                Ambulation/Gait Ambulation/Gait assistance: Min guard Gait Distance (Feet): 65 Feet Assistive device: Rolling walker (2 wheels) Gait Pattern/deviations: Step-through pattern Gait velocity: decreased     General Gait Details: HR uo to 155 - did receive cardicac meds earlier this am.  decreases when sitting   Stairs             Wheelchair Mobility     Tilt Bed Tilt Bed Patient Response: Cooperative  Modified Rankin (Stroke Patients Only)       Balance Overall balance assessment: Needs assistance Sitting-balance support: Feet supported Sitting balance-Leahy Scale: Good     Standing balance support: Bilateral upper extremity supported, During functional activity, Reliant on assistive device for balance Standing balance-Leahy Scale: Fair Standing balance comment: very slow, deliberate movements in standing                            Cognition Arousal/Alertness: Awake/alert Behavior During Therapy: WFL for tasks assessed/performed Overall Cognitive Status: Within Functional Limits for tasks assessed                                          Exercises Total Joint Exercises Goniometric ROM: 0-89 Other  Exercises Other Exercises: seated AROM and AAROM for stretching    General Comments        Pertinent Vitals/Pain Pain Assessment Pain Assessment: Faces Faces Pain Scale: Hurts a little bit Pain Location: operative knee - declined pain meds Pain Descriptors / Indicators: Operative site guarding, Aching Pain Intervention(s): Limited activity within patient's tolerance, Monitored during session, Repositioned, Ice applied    Home Living                          Prior Function            PT Goals (current goals can now be found in the care plan section) Progress towards PT goals: Progressing toward goals    Frequency    BID      PT Plan Current  plan remains appropriate    Co-evaluation              AM-PAC PT "6 Clicks" Mobility   Outcome Measure  Help needed turning from your back to your side while in a flat bed without using bedrails?: None Help needed moving from lying on your back to sitting on the side of a flat bed without using bedrails?: None Help needed moving to and from a bed to a chair (including a wheelchair)?: A Little Help needed standing up from a chair using your arms (e.g., wheelchair or bedside chair)?: A Little Help needed to walk in hospital room?: A Little Help needed climbing 3-5 steps with a railing? : A Lot 6 Click Score: 19    End of Session Equipment Utilized During Treatment: Gait belt Activity Tolerance: Patient tolerated treatment well Patient left: in bed;with bed alarm set;with family/visitor present;with call bell/phone within reach Nurse Communication: Mobility status PT Visit Diagnosis: Difficulty in walking, not elsewhere classified (R26.2);Other abnormalities of gait and mobility (R26.89);Muscle weakness (generalized) (M62.81)     Time: 3244-0102 PT Time Calculation (min) (ACUTE ONLY): 24 min  Charges:    $Gait Training: 8-22 mins $Therapeutic Exercise: 8-22 mins PT General Charges $$ ACUTE PT VISIT: 1 Visit                   Danielle Dess, PTA 12/04/22, 10:56 AM

## 2022-12-04 NOTE — Progress Notes (Signed)
Subjective: 4 Days Post-Op Procedure(s) (LRB): COMPUTER ASSISTED TOTAL KNEE ARTHROPLASTY (Right) Patient reports mild pain this AM.  Eating breakfast. Patient developed tachycardia with PT yesterday, resolved following and with Metoprolol We will continue to progress with therapy today.  Plan is to go Skilled nursing facility after hospital stay. She reports she has had a BM at this time.  Objective: Vital signs in last 24 hours: Temp:  [97.8 F (36.6 C)] 97.8 F (36.6 C) (08/03 2358) Pulse Rate:  [72-108] 92 (08/03 2358) Resp:  [18] 18 (08/03 2358) BP: (134-148)/(53-71) 148/71 (08/03 2358) SpO2:  [89 %-100 %] 100 % (08/03 2358)  Intake/Output from previous day:  Intake/Output Summary (Last 24 hours) at 12/04/2022 0757 Last data filed at 12/03/2022 1052 Gross per 24 hour  Intake 120 ml  Output 100 ml  Net 20 ml    Intake/Output this shift: No intake/output data recorded.  Labs: No results for input(s): "HGB" in the last 72 hours.  No results for input(s): "WBC", "RBC", "HCT", "PLT" in the last 72 hours.  No results for input(s): "NA", "K", "CL", "CO2", "BUN", "CREATININE", "GLUCOSE", "CALCIUM" in the last 72 hours.  No results for input(s): "LABPT", "INR" in the last 72 hours.  EXAM General - Patient is Alert, Appropriate, and Oriented Extremity - Neurologically intact ABD soft Neurovascular intact Sensation intact distally Intact pulses distally Dorsiflexion/Plantar flexion intact No cellulitis present Compartment soft Dressing -mild drainage appreciated at the proximal aspect of the Aquacel bandage this morning.  Bandage covering the JP drain site is clean without any apparent signs of drainage. Motor Function - intact, moving foot and toes well on exam.  Patient able to plantar and dorsiflex with good range of motion and strength, 5/5.  Patient is neurovascularly intact down her right lower extremity to all dermatomes.  Posterior tibial pulses appreciated.   Past  Medical History:  Diagnosis Date   Anemia    Arthritis    Basal cell carcinoma (BCC) of lower leg    Bilateral leg edema    Calcaneal bursitis (heel), unspecified laterality    Chronic pain syndrome    CKD (chronic kidney disease) stage 3, GFR 30-59 ml/min ([name])    COVID-19 04/2021   DDD (degenerative disc disease), lumbar    Diastolic dysfunction    a.) TTE 12/19/2016: EF >55%, mod LVH, triv AR, mild MR/TR/PR, RVSP 57.5; b.) TTE 02/10/2017: EF >55%, mild BAE, mild RVE, triv PR, mild MR, mod TR, RVSP 69.9, G1DD; c.) TTE 07/21/2022: EF >55%, triv MR, mild TR, RVSP 40.3, G1DD   DOE (dyspnea on exertion)    Ductal carcinoma in situ (DCIS) of right breast 2018   a.) s/p partial mastectomy + adjuvant XRT   Heart murmur    History of bilateral cataract extraction    Hyperlipidemia    Hypertension    Hypoalbuminemia    Hypomagnesemia    Hypothyroidism    Low serum vitamin B12    Lumbar facet arthropathy    Lumbar foraminal stenosis    Lumbar nerve root compression    Lumbar spondylosis    Lymphedema    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    OA (osteoarthritis)    Obesity    Protrusion of lumbar intervertebral disc    Pulmonary hypertension ([name])    a.) TTE 12/19/2016: RVSP 57.5; b.) TTE 02/10/2017: RVSP 69.9; c.) TTE 07/21/2022: RVSP 40.3   PVC's (premature ventricular contractions)    Sacroiliac joint dysfunction of left side  T2DM (type 2 diabetes mellitus) ([name])    Vitamin D deficiency     Assessment/Plan: 4 Days Post-Op Procedure(s) (LRB): COMPUTER ASSISTED TOTAL KNEE ARTHROPLASTY (Right) Principal Problem:   Total knee replacement status  Estimated body mass index is 30.81 kg/m as calculated from the following:   Height as of this encounter: 5\' 3"  (1.6 m).   Weight as of this encounter: 78.9 kg. Advance diet Up with therapy  Vitals reviewed this AM.  No longer on telemetry. Up with therapy today.  Currently plan is for d/c to SNF however will continue to  see how the patient performs with PT. She has had a BM.  Weight-Bearing as tolerated to right leg  Patient will plan to follow-up with [facility] clinic orthopedics in 2 weeks for staple removal and reevaluation  J. Horris Latino, PA-C [facility] 12/04/2022, 7:57 AM

## 2022-12-05 LAB — GLUCOSE, CAPILLARY
Glucose-Capillary: 148 mg/dL — ABNORMAL HIGH (ref 70–99)
Glucose-Capillary: 171 mg/dL — ABNORMAL HIGH (ref 70–99)

## 2022-12-05 NOTE — Progress Notes (Signed)
Physical Therapy Treatment Patient Details Name: Carol Ramsey MRN: [id number] DOB: 17-Nov-1945 Today's Date: 12/05/2022   History of Present Illness Carol Ramsey is a 77yoF who presents to [facility] for elective Rt TKA. PTA pt lives at home with husband, also have support planned from son who works from home. Pt was a household AMB with SPC >1 year, fluent with modified technique for 6 entry stairs    PT Comments  Pt's initial plan was for STR due to HR concerns and increased assistance post surgery. However pt has made significant improvement since previous session, demonstrating various transfers and gait with RW on level surface with Supervision. Pt able to negotiate 4 steps B hand rails with CGA and good understanding of technique. HEP and teaching packet given. Pt states she feels comfortable returning home with assist from family, and will benefit from continued PT services post d/c. No DME needs.   If plan is discharge home, recommend the following: A little help with walking and/or transfers;Help with stairs or ramp for entrance;Assist for transportation   Can travel by private vehicle     Yes  Equipment Recommendations  None recommended by PT    Recommendations for Other Services       Precautions / Restrictions Precautions Precautions: Fall;Knee Precaution Booklet Issued: Yes (comment) Precaution Comments: Pt given Precaution/exercise booklet this session with good understanding Restrictions Weight Bearing Restrictions: Yes RLE Weight Bearing: Weight bearing as tolerated     Mobility  Bed Mobility               General bed mobility comments: NT, in recliner at start and end of session    Transfers Overall transfer level: Needs assistance Equipment used: Rolling walker (2 wheels) Transfers: Sit to/from Stand, Bed to chair/wheelchair/BSC Sit to Stand: Supervision           General transfer comment: Able to stand from chair without arm rests with CGA for safety     Ambulation/Gait Ambulation/Gait assistance: Supervision Gait Distance (Feet): 50 Feet Assistive device: Rolling walker (2 wheels) Gait Pattern/deviations: Step-through pattern Gait velocity: decreased     General Gait Details:  (HR 93-97 throughout session)   Stairs Stairs: Yes Stairs assistance: Min guard Stair Management: Two rails, Step to pattern Number of Stairs: 4 General stair comments:  (HR stable upon exertion, good safety awareness on stairs, husband present)   Wheelchair Mobility     Tilt Bed    Modified Rankin (Stroke Patients Only)       Balance Overall balance assessment: Needs assistance Sitting-balance support: Feet supported Sitting balance-Leahy Scale: Good     Standing balance support: Bilateral upper extremity supported, During functional activity, Reliant on assistive device for balance, Single extremity supported Standing balance-Leahy Scale: Fair Standing balance comment: No LOB with gait training and RW                            Cognition Arousal/Alertness: Awake/alert Behavior During Therapy: WFL for tasks assessed/performed Overall Cognitive Status: Within Functional Limits for tasks assessed                                 General Comments:  (Very pleasant and cooperative, supportive family present)        Exercises Total Joint Exercises Ankle Circles/Pumps: AROM, Both, 10 reps, Seated Heel Slides: AROM, Right, 10 reps, Seated Long Arc Quad: AROM, Right, 10  reps, Seated Knee Flexion: AROM, Right, 5 reps Goniometric ROM: 0-90 Other Exercises Other Exercises: Educ re: polar care and benefits of increasing mobility    General Comments General comments (skin integrity, edema, etc.):  (Pt voices feeling more secure with returning home and bypassing STR at this time.)      Pertinent Vitals/Pain Pain Assessment Pain Assessment: 0-10 Pain Score: 3  Pain Location: 3/10 with mobility in knee,  premedicated Pain Descriptors / Indicators: Aching Pain Intervention(s): Monitored during session    Home Living                          Prior Function            PT Goals (current goals can now be found in the care plan section) Acute Rehab PT Goals Patient Stated Goal:  (Go home) Progress towards PT goals: Progressing toward goals    Frequency    BID      PT Plan Current plan remains appropriate    Co-evaluation              AM-PAC PT "6 Clicks" Mobility   Outcome Measure  Help needed turning from your back to your side while in a flat bed without using bedrails?: None Help needed moving from lying on your back to sitting on the side of a flat bed without using bedrails?: None Help needed moving to and from a bed to a chair (including a wheelchair)?: A Little Help needed standing up from a chair using your arms (e.g., wheelchair or bedside chair)?: A Little Help needed to walk in hospital room?: A Little Help needed climbing 3-5 steps with a railing? : A Little 6 Click Score: 20    End of Session Equipment Utilized During Treatment: Gait belt Activity Tolerance: Patient tolerated treatment well Patient left: in chair;with call bell/phone within reach;with chair alarm set;with family/visitor present Nurse Communication: Mobility status PT Visit Diagnosis: Difficulty in walking, not elsewhere classified (R26.2);Other abnormalities of gait and mobility (R26.89);Muscle weakness (generalized) (M62.81)     Time: 1250-1310 PT Time Calculation (min) (ACUTE ONLY): 20 min  Charges:    $Gait Training: 8-22 mins $Therapeutic Exercise: 8-22 mins PT General Charges $$ ACUTE PT VISIT: 1 Visit                    Zadie Cleverly, PTA  Jannet Askew 12/05/2022, 1:12 PM

## 2022-12-05 NOTE — Consult Note (Signed)
[facility] Customer service manager [facility]) Accountable Care Organization (ACO) [facility] Liaison Note  12/05/2022  Carol Ramsey 06/22/1945 [id number]  Location: [facility] Liaison screened the patient remotely at [facility].  Insurance:  Medicare   Carol Ramsey is a 77 y.o. female who is a Primary Care Patient of Bosie Clos, MD. The patient was screened for readmission hospitalization with noted low risk score for unplanned readmission risk with 1 IP in 6 months.  The patient was assessed for potential [facility]) Care Management service needs for post hospital transition for care coordination. Review of patient's electronic medical record reveals patient was admitted for total knee replacement. Pt was discharged with [facility] services with [facility]. No anticipated needs at this time.   [facility] Care Management/Population Health does not replace or interfere with any arrangements made by the Inpatient Transition of Care team.   For questions contact:   Elliot Cousin, RN, [facility] Liaison [place]   Population Health Office Hours MTWF  8:00 am-6:00 pm Off on Thursday [phone] mobile [phone] [Office toll free line] Office Hours are M-F 8:30 - 5 pm 24 hour nurse advise line [phone] [email or web address]

## 2022-12-05 NOTE — Plan of Care (Signed)

## 2022-12-05 NOTE — Care Management Important Message (Signed)
Important Message  Patient Details  Name: Carol Ramsey MRN: [id number] Date of Birth: Mar 03, 1946   Medicare Important Message Given:  Yes     [name] 12/05/2022, 11:41 AM

## 2022-12-05 NOTE — Discharge Summary (Signed)
Physician Discharge Summary  Subjective: 5 Days Post-Op Procedure(s) (LRB): COMPUTER ASSISTED TOTAL KNEE ARTHROPLASTY (Right) Patient reports pain as mild.   Patient seen in rounds with Dr. Ernest Pine. Patient is well, and has had no acute complaints or problems.  States that she is doing better with PT.  Denies having any chest pain, shortness of breath, N/V, fevers or chills. Patient is ready to go to a rehab facility  Physician Discharge Summary  Patient ID: Berdine L Twersky MRN: [id number] DOB/AGE: 04-24-1946 77 y.o.  Admit date: 11/30/2022 Discharge date: 12/05/2022  Admission Diagnoses:  Discharge Diagnoses:  Principal Problem:   Total knee replacement status   Discharged Condition: fair  Hospital Course: Hospital Course: Patient presented to the hospital on 11/30/2022 for an elective right total knee arthroplasty performed by Dr. Ernest Pine.  Patient was given TXA and Ancef perioperatively.  Patient tolerated surgery well.  Lateral releases and pie crusting of IT band were performed.  See operative details below.  Postoperatively the patient was experiencing some asymptomatic bradycardia, was placed on telemetry and sent to the orthopedic floor for further evaluation and monitoring.  Patient had some difficulty working with physical therapy during postop day 1 and was feeling lightheaded and nauseous.  Patient her postop day 1 was also feeling fatigued and tired, believe this to be due to medications, received Dilaudid over her first night after surgery.  Patient was also having some drainage, JP drain was left in until the afternoon of postop day 1, when it was removed, intact.  Aquacel bandage was changed out given that she had drainage appreciated at the proximal aspect of her bandage.  Postoperatively, patient was having some difficulties meeting her PT protocols.  Found it best to transition to instead of going home rather a SNF.  TOC has found patient placement.  Patient's vital signs are  stable at rest and up with working with PT.  Patient is stable for discharge.   PROCEDURE:  Right total knee arthroplasty using computer-assisted navigation   SURGEON:  Jena Gauss. M.D.   ASSISTANT:  Gean Birchwood, PA-C (present and scrubbed throughout the case, critical for assistance with exposure, retraction, instrumentation, and closure)   ANESTHESIA: spinal   ESTIMATED BLOOD LOSS: 50 mL   FLUIDS REPLACED: 1200 mL of crystalloid   TOURNIQUET TIME: 96 minutes   DRAINS: 2 medium Hemovac drains   SOFT TISSUE RELEASES: Anterior cruciate ligament, posterior cruciate ligament, deep medial collateral ligament, patellofemoral ligament, posterolateral corner, and pie crusting of the IT band   IMPLANTS UTILIZED: DePuy Attune size 5 posterior stabilized femoral component (cemented), size 5 rotating platform tibial component (cemented), 35 mm medialized dome patella (cemented), and a 7 mm stabilized rotating platform polyethylene insert.  Treatments: In the hospital, changed to metoprolol and stopped glipizide  Discharge Exam: Blood pressure (!) 143/55, pulse 86, temperature 98.5 F (36.9 C), resp. rate 16, height 5\' 3"  (1.6 m), weight 78.9 kg, SpO2 100%.   Disposition: To SNF   Allergies as of 12/05/2022       Reactions   Gabapentin Swelling   Quinine Nausea Only, Nausea And Vomiting   Amoxicillin Itching, Rash   IgE = 12 (WNL) on 11/22/2022   Dilaudid  [hydromorphone Hcl] Rash   Etodolac Rash   Hydrochlorothiazide Rash   Hydrocodone Rash   Hydromorphone Rash   Sulfa Antibiotics Rash        Medication List     STOP taking these medications    metoprolol succinate 100 MG  24 hr tablet Commonly known as: TOPROL-XL       TAKE these medications    acetaminophen 500 MG tablet Commonly known as: TYLENOL Take 500 mg by mouth every 6 (six) hours as needed for mild pain or moderate pain.   ascorbic acid 500 MG tablet Commonly known as: VITAMIN C Take 500 mg by  mouth daily.   aspirin 81 MG chewable tablet Chew 1 tablet (81 mg total) by mouth 2 (two) times daily.   atorvastatin 10 MG tablet Commonly known as: LIPITOR Take 10 mg by mouth daily at 6 PM.   dapagliflozin propanediol 10 MG Tabs tablet Commonly known as: Farxiga Take 1 tablet (10 mg total) by mouth daily. Patient receives through [name] Patient [place]   furosemide 20 MG tablet Commonly known as: LASIX Take 20 mg by mouth as needed.   glipiZIDE 10 MG 24 hr tablet Commonly known as: GLUCOTROL XL Take 1 tablet (10 mg total) by mouth daily with breakfast. TAKE 1 TABLET BY MOUTH DAILY   insulin glargine 100 UNIT/ML injection Commonly known as: LANTUS Inject 15 Units into the skin as needed.   lisinopril 20 MG tablet Commonly known as: ZESTRIL Take 20 mg by mouth daily. QAM   OneTouch Verio test strip Generic drug: glucose blood TEST BLOOD SUGAR TWICE DAILY AS INSTRUCTED   OVER THE COUNTER MEDICATION cinnamon bark 500 mg 1 tab daily   OVER THE COUNTER MEDICATION Omega XL-supplement   OVER THE COUNTER MEDICATION CALCIUM 600-D3 PLUS, MAG-ZINC   oxyCODONE 5 MG immediate release tablet Commonly known as: Oxy IR/ROXICODONE Take 1 tablet (5 mg total) by mouth every 4 (four) hours as needed for moderate pain (pain score 4-6).   Rybelsus 7 MG Tabs Generic drug: Semaglutide Take 1 tablet by mouth daily.   traMADol 50 MG tablet Commonly known as: ULTRAM Take 1-2 tablets (50-100 mg total) by mouth every 4 (four) hours as needed for moderate pain.   Vitamin B-12 5000 MCG Subl Place 5,000 mcg under the tongue daily.   Vitamin D 125 MCG (5000 UT) Caps Take 5,000 Units by mouth daily.               Durable Medical Equipment  (From admission, onward)           Start     Ordered   11/30/22 1648  DME Walker rolling  Once       Question:  Patient needs a walker to treat with the following condition  Answer:  Total knee replacement status   11/30/22 1647    11/30/22 1648  DME Bedside commode  Once       Comments: Patient is not able to walk the distance required to go the bathroom, or he/she is unable to safely negotiate stairs required to access the bathroom.  A 3in1 BSC will alleviate this problem  Question:  Patient needs a bedside commode to treat with the following condition  Answer:  Total knee replacement status   11/30/22 1647            Follow-up Information     Rayburn Go, PA-C Follow up on 12/15/2022.   Specialty: Orthopedic Surgery Why: at 10:15am Contact information: [address] [place] [address] [phone]         Donato Heinz, MD Follow up on 01/19/2023.   Specialty: Orthopedic Surgery Why: at 10:15am Contact information: [address] [facility] [place] [address] [phone]  Signed: Gean Birchwood 12/05/2022, 11:11 AM   Objective: Vital signs in last 24 hours: Temp:  [98.1 F (36.7 C)-98.6 F (37 C)] 98.5 F (36.9 C) (08/05 0747) Pulse Rate:  [86-98] 86 (08/05 0747) Resp:  [16-20] 16 (08/05 0747) BP: (127-163)/(47-76) 143/55 (08/05 0747) SpO2:  [100 %] 100 % (08/05 0747)  Intake/Output from previous day:  Intake/Output Summary (Last 24 hours) at 12/05/2022 1111 Last data filed at 12/05/2022 1000 Gross per 24 hour  Intake 600 ml  Output --  Net 600 ml    Intake/Output this shift: Total I/O In: 240 [P.O.:240] Out: -   Labs: No results for input(s): "HGB" in the last 72 hours. No results for input(s): "WBC", "RBC", "HCT", "PLT" in the last 72 hours. No results for input(s): "NA", "K", "CL", "CO2", "BUN", "CREATININE", "GLUCOSE", "CALCIUM" in the last 72 hours. No results for input(s): "LABPT", "INR" in the last 72 hours.  EXAM: General - Patient is Alert, Appropriate, and Oriented Extremity - Neurologically intact ABD soft Neurovascular intact Sensation intact distally Intact pulses distally Dorsiflexion/Plantar flexion  intact No cellulitis present Compartment soft Dressing -mild drainage appreciated at the proximal aspect of the Aquacel bandage this morning.  Bandage covering the JP drain site is clean without any apparent signs of drainage. Motor Function - intact, moving foot and toes well on exam.  Patient able to plantar and dorsiflex with good range of motion and strength, 5/5.  Patient is neurovascularly intact down her right lower extremity to all dermatomes.  Posterior tibial pulses appreciated.  Assessment/Plan: 5 Days Post-Op Procedure(s) (LRB): COMPUTER ASSISTED TOTAL KNEE ARTHROPLASTY (Right) Procedure(s) (LRB): COMPUTER ASSISTED TOTAL KNEE ARTHROPLASTY (Right) Past Medical History:  Diagnosis Date   Anemia    Arthritis    Basal cell carcinoma (BCC) of lower leg    Bilateral leg edema    Calcaneal bursitis (heel), unspecified laterality    Chronic pain syndrome    CKD (chronic kidney disease) stage 3, GFR 30-59 ml/min ([name])    COVID-19 04/2021   DDD (degenerative disc disease), lumbar    Diastolic dysfunction    a.) TTE 12/19/2016: EF >55%, mod LVH, triv AR, mild MR/TR/PR, RVSP 57.5; b.) TTE 02/10/2017: EF >55%, mild BAE, mild RVE, triv PR, mild MR, mod TR, RVSP 69.9, G1DD; c.) TTE 07/21/2022: EF >55%, triv MR, mild TR, RVSP 40.3, G1DD   DOE (dyspnea on exertion)    Ductal carcinoma in situ (DCIS) of right breast 2018   a.) s/p partial mastectomy + adjuvant XRT   Heart murmur    History of bilateral cataract extraction    Hyperlipidemia    Hypertension    Hypoalbuminemia    Hypomagnesemia    Hypothyroidism    Low serum vitamin B12    Lumbar facet arthropathy    Lumbar foraminal stenosis    Lumbar nerve root compression    Lumbar spondylosis    Lymphedema    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    OA (osteoarthritis)    Obesity    Protrusion of lumbar intervertebral disc    Pulmonary hypertension ([name])    a.) TTE 12/19/2016: RVSP 57.5; b.) TTE 02/10/2017: RVSP 69.9;  c.) TTE 07/21/2022: RVSP 40.3   PVC's (premature ventricular contractions)    Sacroiliac joint dysfunction of left side    T2DM (type 2 diabetes mellitus) ([name])    Vitamin D deficiency    Principal Problem:   Total knee replacement status  Estimated body mass index is 30.81 kg/m  as calculated from the following:   Height as of this encounter: 5\' 3"  (1.6 m).   Weight as of this encounter: 78.9 kg. Advance diet Up with therapy Patient will be discharged to an SNF.  Continue to work with physical therapy on strength, range of motion and ambulation.  Look to transition to outpatient physical therapy   Discussed with the patient continuing to utilize Polar Care   Patient will use bone foam in 20-30 minute intervals   Patient will wear TED hose bilaterally to help prevent DVT and clot formation   Discussed the Aquacel bandage.  This bandage will stay in place 7 days postoperatively.  Can be replaced with honeycomb bandages that will be sent home with the patient   Discussed sending the patient home with tramadol and oxycodone for as needed pain management.  Patient will take an 81 mg aspirin twice daily for DVT prophylaxis   Weight-Bearing as tolerated to right leg   Patient will plan to follow-up with [facility] clinic orthopedics 2 weeks postoperatively for staple removal and reevaluation  Diet - Diabetic diet Follow up - in 9 days Activity - WBAT Disposition - Skilled nursing facility Condition Upon Discharge - Fair DVT Prophylaxis - Aspirin and TED hose  Danise Edge, PA-C Orthopaedic Surgery 12/05/2022, 11:11 AM

## 2022-12-05 NOTE — TOC Transition Note (Signed)
Transition of Care [facility]) - CM/SW Discharge Note   Patient Details  Name: Carol Ramsey MRN: [id number] Date of Birth: [birth year]  Transition of Care [facility]) CM/SW Contact:  Garret Reddish, RN Phone Number: 12/05/2022, 1:28 PM   Clinical Narrative:   Chart reviewed.  I have reviewed bed offers with [name].  She has chosen [facility].  I have informed Sue Lush with [facility] that patient has accepted bed offer.  Sue Lush reports that she will not have a bed for the patient until tomorrow.  I have made [name] aware and she would like to go home.  [name] reports that she has a 2 wheeled rolling walker at home and a BSC at home.  She would like to use [facility] for Home Health PT.    PT will work with patient today on stairs and patient will discharge after PT session.  Patient's husband will transport patient home today.    I have informed provider and staff nurse of the above information.  I  have also made Sue Lush with [facility] aware that patient will discharge home today.      Final next level of care: Home w Home Health Services Barriers to Discharge: No Barriers Identified   Patient Goals and CMS Choice CMS Medicare.gov Compare Post Acute Care list provided to:: Patient Choice offered to / list presented to : Patient  Discharge Placement                    Name of family member notified: Louberta Heldreth Patient and family notified of of transfer: 12/05/22  Discharge Plan and Services Additional resources added to the After Visit Summary for                  DME Arranged:  (Patient has a 2 wheeled rolling walker and  BSC at home.)         HH Arranged: PT HH Agency: [facility] Date [facility] Agency Contacted: 12/05/22 Time HH Agency Contacted: 1200 Representative spoke with at [facility] Agency: [place]  Social Determinants of Health (SDOH) Interventions SDOH Screenings   Food Insecurity: Food Insecurity Present (11/30/2022)  Housing: Low  Risk  (11/30/2022)  Transportation Needs: No Transportation Needs (11/30/2022)  Utilities: Not At Risk (11/30/2022)  Alcohol Screen: Low Risk  (06/08/2022)  Depression (PHQ2-9): Low Risk  (06/08/2022)  Financial Resource Strain: Low Risk  (06/08/2022)  Physical Activity: Inactive (06/08/2022)  Social Connections: Socially Isolated (06/08/2022)  Stress: Stress Concern Present (06/08/2022)  Tobacco Use: Low Risk  (11/30/2022)     Readmission Risk Interventions     No data to display

## 2022-12-05 NOTE — Inpatient Diabetes Management (Signed)
Inpatient Diabetes Program Recommendations  AACE/ADA: New Consensus Statement on Inpatient Glycemic Control   Target Ranges:  Prepandial:   less than 140 mg/dL      Peak postprandial:   less than 180 mg/dL (1-2 hours)      Critically ill patients:  140 - 180 mg/dL    Latest Reference Range & Units 12/04/22 08:10 12/04/22 11:40 12/04/22 16:50 12/04/22 21:03 12/04/22 23:35  Glucose-Capillary 70 - 99 mg/dL 77 742 (H) 595 (H) 68 (L) 116 (H)   Review of Glycemic Control  Diabetes history: DM2 Outpatient Diabetes medications: Lantus 15 units as needed, Farxiga 10 mg daily, Glipizide 10 mg daily, Rybelsus 7 mg daily Current orders for Inpatient glycemic control: Semglee 5 units at bedtime, Novolog 0-15 units TID with meals, Novolog 0-5 units at bedtime, Glipizide XL 10 mg QAM, Farxiga 10 mg daily, Rybelsus 7 mg daily  Inpatient Diabetes Program Recommendations:    Oral DM medication: CBG 77 yesterday morning and down to 68 mg/dl at 63:87 on 09/04/41.  Please consider discontinuing Glipizide XL 10 mg daily while inpatient.  Thanks, Orlando Penner, RN, MSN, CDCES Diabetes Coordinator Inpatient Diabetes Program [phone] (Team Pager from 8am to 5pm)

## 2022-12-05 NOTE — Progress Notes (Signed)
Physical Therapy Treatment Patient Details Name: Carol Ramsey MRN: [id number] DOB: 03/22/1946 Today's Date: 12/05/2022   History of Present Illness Carol Ramsey is a 77yoF who presents to [facility] for elective Rt TKA. PTA pt lives at home with husband, also have support planned from son who works from home. Pt was a household AMB with SPC >1 year, fluent with modified technique for 6 entry stairs    PT Comments  Pt received up in chair, family present and supportive. Pt pre-medicated prior to session. Good demonstration of transfers with Supervision. Gait training with RW ~135ft with CGA for safety. HR seems to be improved from previous session. 5/10 R knee pain with wt bearing, however able to work through discomfort and progress. R knee AROM  0-90, +SLR and LAQ, improved from previous day. Pt remains motivated and anxious to transition to short term rehab prior to returning home. Continue PT per POC.    If plan is discharge home, recommend the following: A little help with walking and/or transfers;Help with stairs or ramp for entrance;Assist for transportation   Can travel by private vehicle     Yes  Equipment Recommendations  None recommended by PT (TBD at next facility. Pt has a SPC and RW at home)    Recommendations for Other Services       Precautions / Restrictions Precautions Precautions: Fall;Knee Precaution Booklet Issued: Yes (comment) Precaution Comments: Pt given Precaution/exercise booklet this session with good understanding Restrictions Weight Bearing Restrictions: Yes RLE Weight Bearing: Weight bearing as tolerated     Mobility  Bed Mobility               General bed mobility comments: NT    Transfers Overall transfer level: Needs assistance Equipment used: Rolling walker (2 wheels) Transfers: Sit to/from Stand Sit to Stand: Supervision           General transfer comment: slow but steady, safe hand positioning and RW management.     Ambulation/Gait Ambulation/Gait assistance: Min guard Gait Distance (Feet):  (100) Assistive device: Rolling walker (2 wheels) Gait Pattern/deviations: Step-through pattern Gait velocity: decreased     General Gait Details:  (Increased tolerance for exertion)   Stairs Stairs: Yes       General stair comments:  (To be addressed at STR prior to returning home)   Wheelchair Mobility     Tilt Bed    Modified Rankin (Stroke Patients Only)       Balance Overall balance assessment: Needs assistance Sitting-balance support: Feet supported Sitting balance-Leahy Scale: Good     Standing balance support: Bilateral upper extremity supported, During functional activity, Reliant on assistive device for balance Standing balance-Leahy Scale: Fair Standing balance comment: No LOB during gait training                            Cognition Arousal/Alertness: Awake/alert Behavior During Therapy: WFL for tasks assessed/performed Overall Cognitive Status: Within Functional Limits for tasks assessed                                 General Comments:  (Very pleasant and cooperative, supportive family present)        Exercises Total Joint Exercises Ankle Circles/Pumps: AROM, Both, 10 reps, Seated Heel Slides: AROM, Right, 10 reps, Seated Long Arc Quad: AROM, Right, 10 reps, Seated Knee Flexion: AROM, Right, 5 reps Goniometric ROM: 0-90 Other Exercises  Other Exercises: Educ re: polar care and benefits of increasing mobility    General Comments General comments (skin integrity, edema, etc.):  (Pt educated on Precaution and R LE exercises packet with good understanding. R knee incision dressing intact.)      Pertinent Vitals/Pain Pain Assessment Pain Assessment: 0-10 Pain Score: 5  Pain Location:  (R knee) Pain Descriptors / Indicators: Aching, Sore, Discomfort Pain Intervention(s): Premedicated before session, Ice applied    Home Living                           Prior Function            PT Goals (current goals can now be found in the care plan section) Acute Rehab PT Goals Patient Stated Goal: pain control, nausea control Progress towards PT goals: Progressing toward goals    Frequency    BID      PT Plan Current plan remains appropriate    Co-evaluation              AM-PAC PT "6 Clicks" Mobility   Outcome Measure  Help needed turning from your back to your side while in a flat bed without using bedrails?: None Help needed moving from lying on your back to sitting on the side of a flat bed without using bedrails?: None Help needed moving to and from a bed to a chair (including a wheelchair)?: A Little Help needed standing up from a chair using your arms (e.g., wheelchair or bedside chair)?: A Little Help needed to walk in hospital room?: A Little Help needed climbing 3-5 steps with a railing? : A Lot 6 Click Score: 19    End of Session Equipment Utilized During Treatment: Gait belt Activity Tolerance: Patient tolerated treatment well Patient left: in chair;with call bell/phone within reach;with chair alarm set;with family/visitor present Nurse Communication: Mobility status PT Visit Diagnosis: Difficulty in walking, not elsewhere classified (R26.2);Other abnormalities of gait and mobility (R26.89);Muscle weakness (generalized) (M62.81)     Time: 4098-1191 PT Time Calculation (min) (ACUTE ONLY): 24 min  Charges:    $Gait Training: 8-22 mins $Therapeutic Exercise: 8-22 mins PT General Charges $$ ACUTE PT VISIT: 1 Visit                    Zadie Cleverly, PTA  Jannet Askew 12/05/2022, 12:16 PM

## 2022-12-05 NOTE — Plan of Care (Signed)
  Problem: Education: Goal: Knowledge of the prescribed therapeutic regimen will improve Outcome: Progressing   Problem: Activity: Goal: Ability to avoid complications of mobility impairment will improve Outcome: Progressing Goal: Range of joint motion will improve Outcome: Progressing   Problem: Clinical Measurements: Goal: Postoperative complications will be avoided or minimized Outcome: Progressing   Problem: Pain Management: Goal: Pain level will decrease with appropriate interventions Outcome: Progressing   Problem: Skin Integrity: Goal: Will show signs of wound healing Outcome: Progressing   Problem: Education: Goal: Knowledge of General Education information will improve Description: Including pain rating scale, medication(s)/side effects and non-pharmacologic comfort measures Outcome: Progressing   Problem: Health Behavior/Discharge Planning: Goal: Ability to manage health-related needs will improve Outcome: Progressing   Problem: Clinical Measurements: Goal: Ability to maintain clinical measurements within normal limits will improve Outcome: Progressing Goal: Will remain free from infection Outcome: Progressing Goal: Diagnostic test results will improve Outcome: Progressing Goal: Respiratory complications will improve Outcome: Progressing Goal: Cardiovascular complication will be avoided Outcome: Progressing   Problem: Activity: Goal: Risk for activity intolerance will decrease Outcome: Progressing   Problem: Nutrition: Goal: Adequate nutrition will be maintained Outcome: Progressing   Problem: Coping: Goal: Level of anxiety will decrease Outcome: Progressing   Problem: Elimination: Goal: Will not experience complications related to bowel motility Outcome: Progressing Goal: Will not experience complications related to urinary retention Outcome: Progressing   Problem: Pain Managment: Goal: General experience of comfort will improve Outcome:  Progressing   Problem: Safety: Goal: Ability to remain free from injury will improve Outcome: Progressing   Problem: Skin Integrity: Goal: Risk for impaired skin integrity will decrease Outcome: Progressing   

## 2022-12-05 NOTE — Progress Notes (Signed)
Occupational Therapy Treatment Patient Details Name: Mariell L Zarcone MRN: [id number] DOB: 02-03-1946 Today's Date: 12/05/2022   History of present illness Cordelia Busbin is a 77yoF who presents to [facility] for elective Rt TKA. PTA pt lives at home with husband, also have support planned from son who works from home. Pt was a household AMB with SPC >1 year, fluent with modified technique for 6 entry stairs   OT comments  Pt seen for OT tx. Pt pleasant, agreeable. Endorses 2/10 knee pain at rest increasing to 3/10 with mobility/ADL. Premedicated prior to session for pain. Pt required MIN A with vC for hand and foot placement to stand from the recliner with RW and CGA from guest chair without arm rests. Pt ambulated ~10' + CGA and RW to sink where she completed grooming tasks for ~68min with intermittent UE support on the counter before requiring brief seated rest break. Pt/family educated in polar care, AE for LB ADL tasks, falls prevention strategies, and positioning of surgical leg for optimal knee extension. Rolled towel placed under surgical ankle. Pt continues to benefit from skilled OT Services.    Recommendations for follow up therapy are one component of a multi-disciplinary discharge planning process, led by the attending physician.  Recommendations may be updated based on patient status, additional functional criteria and insurance authorization.    Assistance Recommended at Discharge Frequent or constant Supervision/Assistance  Patient can return home with the following  A little help with walking and/or transfers;A lot of help with bathing/dressing/bathroom;Assistance with cooking/housework;Help with stairs or ramp for entrance;Assist for transportation   Equipment Recommendations  None recommended by OT    Recommendations for Other Services      Precautions / Restrictions Precautions Precautions: Fall;Knee Restrictions Weight Bearing Restrictions: Yes RLE Weight Bearing: Weight bearing as  tolerated       Mobility Bed Mobility               General bed mobility comments: NT, in recliner at start and end of session    Transfers Overall transfer level: Needs assistance Equipment used: Rolling walker (2 wheels) Transfers: Sit to/from Stand Sit to Stand: Min assist           General transfer comment: MIN A from recliner, CGA from guest chair in room with VC for hand placement both times     Balance Overall balance assessment: Needs assistance Sitting-balance support: Feet supported Sitting balance-Leahy Scale: Good     Standing balance support: Bilateral upper extremity supported, During functional activity, Reliant on assistive device for balance, Single extremity supported Standing balance-Leahy Scale: Fair Standing balance comment: UE support on the counter while completing grooming tasks at sink                           ADL either performed or assessed with clinical judgement   ADL Overall ADL's : Needs assistance/impaired     Grooming: Standing;Set up;Wash/dry face;Wash/dry hands;Oral care;Supervision/safety Grooming Details (indicate cue type and reason): tolerated standing for grooming tasks for ~41min before needing a brief seated rest break prior to walking back over to the recliner ~10' away with RW.                             Functional mobility during ADLs: Min guard;Rolling walker (2 wheels)      Extremity/Trunk Assessment  Vision       Perception     Praxis      Cognition Arousal/Alertness: Awake/alert Behavior During Therapy: WFL for tasks assessed/performed Overall Cognitive Status: Within Functional Limits for tasks assessed                                          Exercises Other Exercises Other Exercises: Pt/family educated in polar care, AE for LB ADL tasks, falls prevention strategies, and positioning of surgical leg for optimal knee extension    Shoulder  Instructions       General Comments      Pertinent Vitals/ Pain       Pain Assessment Pain Assessment: 0-10 Pain Score: 3  Pain Location: 3/10 with mobility in knee, premedicated Pain Descriptors / Indicators: Aching Pain Intervention(s): Limited activity within patient's tolerance, Monitored during session, Premedicated before session, Repositioned, Ice applied  Home Living                                          Prior Functioning/Environment              Frequency  Min 1X/week        Progress Toward Goals  OT Goals(current goals can now be found in the care plan section)  Progress towards OT goals: Progressing toward goals  Acute Rehab OT Goals Patient Stated Goal: get stronger OT Goal Formulation: With patient/family Potential to Achieve Goals: Good  Plan Discharge plan remains appropriate;Frequency remains appropriate    Co-evaluation                 AM-PAC OT "6 Clicks" Daily Activity     Outcome Measure   Help from another person eating meals?: None Help from another person taking care of personal grooming?: A Little Help from another person toileting, which includes using toliet, bedpan, or urinal?: A Little Help from another person bathing (including washing, rinsing, drying)?: A Lot Help from another person to put on and taking off regular upper body clothing?: A Little Help from another person to put on and taking off regular lower body clothing?: A Lot 6 Click Score: 17    End of Session Equipment Utilized During Treatment: Rolling walker (2 wheels);Gait belt  OT Visit Diagnosis: Unsteadiness on feet (R26.81);Other abnormalities of gait and mobility (R26.89)   Activity Tolerance Patient tolerated treatment well   Patient Left in chair;with call bell/phone within reach;with chair alarm set;with family/visitor present;with nursing/sitter in room   Nurse Communication          Time: 1610-9604 OT Time Calculation  (min): 25 min  Charges: OT General Charges $OT Visit: 1 Visit OT Treatments $Self Care/Home Management : 23-37 mins  Arman Filter., MPH, MS, OTR/L ascom [phone] 12/05/22, 12:13 PM

## 2022-12-05 NOTE — Progress Notes (Signed)
Subjective: 5 Days Post-Op Procedure(s) (LRB): COMPUTER ASSISTED TOTAL KNEE ARTHROPLASTY (Right) Patient reports mild pain this AM. Patient developed tachycardia with PT this weekend, resolved following and continuing with Metoprolol We will continue to progress with therapy today.  Plan is to go Skilled nursing facility after hospital stay. She reports she has had a BM at this time.  Denies any CP, SOB, N/V, fevers or chills.  Objective: Vital signs in last 24 hours: Temp:  [98.1 F (36.7 C)-98.6 F (37 C)] 98.5 F (36.9 C) (08/05 0747) Pulse Rate:  [86-98] 86 (08/05 0747) Resp:  [16-20] 16 (08/05 0747) BP: (127-163)/(47-76) 143/55 (08/05 0747) SpO2:  [100 %] 100 % (08/05 0747)  Intake/Output from previous day:  Intake/Output Summary (Last 24 hours) at 12/05/2022 1027 Last data filed at 12/05/2022 1000 Gross per 24 hour  Intake 600 ml  Output --  Net 600 ml    Intake/Output this shift: Total I/O In: 240 [P.O.:240] Out: -   Labs: No results for input(s): "HGB" in the last 72 hours.  No results for input(s): "WBC", "RBC", "HCT", "PLT" in the last 72 hours.  No results for input(s): "NA", "K", "CL", "CO2", "BUN", "CREATININE", "GLUCOSE", "CALCIUM" in the last 72 hours.  No results for input(s): "LABPT", "INR" in the last 72 hours.  EXAM General - Patient is Alert, Appropriate, and Oriented Extremity - Neurologically intact ABD soft Neurovascular intact Sensation intact distally Intact pulses distally Dorsiflexion/Plantar flexion intact No cellulitis present Compartment soft Dressing -mild drainage appreciated at the proximal aspect of the Aquacel bandage this morning.  Bandage covering the JP drain site is clean without any apparent signs of drainage. Motor Function - intact, moving foot and toes well on exam.  Patient able to plantar and dorsiflex with good range of motion and strength, 5/5.  Patient is neurovascularly intact down her right lower extremity to all  dermatomes.  Posterior tibial pulses appreciated.   Past Medical History:  Diagnosis Date   Anemia    Arthritis    Basal cell carcinoma (BCC) of lower leg    Bilateral leg edema    Calcaneal bursitis (heel), unspecified laterality    Chronic pain syndrome    CKD (chronic kidney disease) stage 3, GFR 30-59 ml/min ([name])    COVID-19 04/2021   DDD (degenerative disc disease), lumbar    Diastolic dysfunction    a.) TTE 12/19/2016: EF >55%, mod LVH, triv AR, mild MR/TR/PR, RVSP 57.5; b.) TTE 02/10/2017: EF >55%, mild BAE, mild RVE, triv PR, mild MR, mod TR, RVSP 69.9, G1DD; c.) TTE 07/21/2022: EF >55%, triv MR, mild TR, RVSP 40.3, G1DD   DOE (dyspnea on exertion)    Ductal carcinoma in situ (DCIS) of right breast 2018   a.) s/p partial mastectomy + adjuvant XRT   Heart murmur    History of bilateral cataract extraction    Hyperlipidemia    Hypertension    Hypoalbuminemia    Hypomagnesemia    Hypothyroidism    Low serum vitamin B12    Lumbar facet arthropathy    Lumbar foraminal stenosis    Lumbar nerve root compression    Lumbar spondylosis    Lymphedema    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    OA (osteoarthritis)    Obesity    Protrusion of lumbar intervertebral disc    Pulmonary hypertension ([name])    a.) TTE 12/19/2016: RVSP 57.5; b.) TTE 02/10/2017: RVSP 69.9; c.) TTE 07/21/2022: RVSP 40.3   PVC's (premature  ventricular contractions)    Sacroiliac joint dysfunction of left side    T2DM (type 2 diabetes mellitus) ([name])    Vitamin D deficiency     Assessment/Plan: 5 Days Post-Op Procedure(s) (LRB): COMPUTER ASSISTED TOTAL KNEE ARTHROPLASTY (Right) Principal Problem:   Total knee replacement status  Estimated body mass index is 30.81 kg/m as calculated from the following:   Height as of this encounter: 5\' 3"  (1.6 m).   Weight as of this encounter: 78.9 kg. Advance diet Up with therapy  Blood sugars have been running low, inpatient diabetes coordinator  contact, discontinued glipizide while remaining inpatient.  Vitals reviewed this AM.  No longer on telemetry. Up with therapy today.  Currently plan is for d/c to SNF however will continue to see how the patient performs with PT. TOC has report found a SNF for this patient to go to.  Waiting bed placement. She has had a BM.  Weight-Bearing as tolerated to right leg  Patient will plan to follow-up with [facility] clinic orthopedics in 2 weeks for staple removal and reevaluation  Danise Edge, PA-C [facility] 12/05/2022, 10:27 AM

## 2022-12-05 NOTE — Progress Notes (Signed)
Discharge instructions given to patient and family. Two aquacell dressings and prescriptions placed in discharge packet. Family verbalized understanding of instructions

## 2022-12-06 DIAGNOSIS — E1122 Type 2 diabetes mellitus with diabetic chronic kidney disease: Secondary | ICD-10-CM | POA: Diagnosis not present

## 2022-12-06 DIAGNOSIS — N184 Chronic kidney disease, stage 4 (severe): Secondary | ICD-10-CM | POA: Diagnosis not present

## 2022-12-09 ENCOUNTER — Emergency Department
Admission: EM | Admit: 2022-12-09 | Discharge: 2022-12-09 | Disposition: A | Discharge status: Home / Self Care | Payer: Medicare Other | Attending: Emergency Medicine | Admitting: Emergency Medicine

## 2022-12-09 ENCOUNTER — Other Ambulatory Visit: Payer: Self-pay

## 2022-12-09 DIAGNOSIS — R58 Hemorrhage, not elsewhere classified: Secondary | ICD-10-CM | POA: Diagnosis not present

## 2022-12-09 DIAGNOSIS — R0902 Hypoxemia: Secondary | ICD-10-CM | POA: Diagnosis not present

## 2022-12-09 DIAGNOSIS — I10 Essential (primary) hypertension: Secondary | ICD-10-CM | POA: Diagnosis not present

## 2022-12-09 DIAGNOSIS — G8918 Other acute postprocedural pain: Secondary | ICD-10-CM

## 2022-12-09 DIAGNOSIS — M25561 Pain in right knee: Secondary | ICD-10-CM | POA: Diagnosis not present

## 2022-12-09 DIAGNOSIS — Z96651 Presence of right artificial knee joint: Secondary | ICD-10-CM | POA: Diagnosis not present

## 2022-12-09 LAB — CBC
HCT: 29.9 % — ABNORMAL LOW (ref 36.0–46.0)
Hemoglobin: 9.7 g/dL — ABNORMAL LOW (ref 12.0–15.0)
MCH: 27 pg (ref 26.0–34.0)
MCHC: 32.4 g/dL (ref 30.0–36.0)
MCV: 83.3 fL (ref 80.0–100.0)
Platelets: 431 10*3/uL — ABNORMAL HIGH (ref 150–400)
RBC: 3.59 MIL/uL — ABNORMAL LOW (ref 3.87–5.11)
RDW: 15 % (ref 11.5–15.5)
WBC: 9.4 10*3/uL (ref 4.0–10.5)
nRBC: 0 % (ref 0.0–0.2)

## 2022-12-09 LAB — BASIC METABOLIC PANEL
Anion gap: 9 (ref 5–15)
BUN: 19 mg/dL (ref 8–23)
CO2: 27 mmol/L (ref 22–32)
Calcium: 9.1 mg/dL (ref 8.9–10.3)
Chloride: 97 mmol/L — ABNORMAL LOW (ref 98–111)
Creatinine, Ser: 1.11 mg/dL — ABNORMAL HIGH (ref 0.44–1.00)
GFR, Estimated: 52 mL/min — ABNORMAL LOW (ref >60)
Glucose, Bld: 157 mg/dL — ABNORMAL HIGH (ref 70–99)
Potassium: 4 mmol/L (ref 3.5–5.1)
Sodium: 133 mmol/L — ABNORMAL LOW (ref 135–145)

## 2022-12-09 NOTE — ED Provider Notes (Signed)
[facility] Provider Note    Event Date/Time   First MD Initiated Contact with Patient 12/09/22 1730     (approximate)   History   Post-op Problem   HPI Carol Ramsey is a 77 y.o. female with recent right total knee replacement 7 days ago who presents today for postoperative pain and swelling.  Patient has noticed bloody drainage from her incision site as well as worsening swelling in that right knee.  She has had pain with ambulation.  Although, she has not used hardly any of her prescribed pain medicines at home to try and treat this pain.  She otherwise denies fevers, redness at the area, pus drainage, weakness or numbness to that leg.     Physical Exam   Triage Vital Signs: ED Triage Vitals [12/09/22 1515]  Encounter Vitals Group     BP (!) 154/71     Systolic BP Percentile      Diastolic BP Percentile      Pulse Rate 81     Resp 16     Temp 98.7 F (37.1 C)     Temp src      SpO2 100 %     Weight 171 lb 15.3 oz (78 kg)     Height 5\' 3"  (1.6 m)     Head Circumference      Peak Flow      Pain Score 2     Pain Loc      Pain Education      Exclude from Growth Chart     Most recent vital signs: Vitals:   12/09/22 1515  BP: (!) 154/71  Pulse: 81  Resp: 16  Temp: 98.7 F (37.1 C)  SpO2: 100%    I have reviewed the vital signs. General:  Awake, alert, no acute distress. Head:  Normocephalic, Atraumatic. EENT:  PERRL, EOMI, Oral mucosa pink and moist, Neck is supple. Cardiovascular: Regular rate, 2+ distal pulses. Respiratory:  Normal respiratory effort, symmetrical expansion, no distress.   Extremities: Midline surgical incision over the right knee healthy other by staples.  No pustular drainage or erythema around the site.  Edema to the right knee which is expected postoperatively.  Bruising also noted around the right knee.  1 area of scant bloody drainage from an opening in the incision site. Neuro:  Alert and oriented.   Interacting appropriately.   Skin:  Warm, dry, no rash.   Psych: Appropriate affect.     ED Results / Procedures / Treatments   Labs (all labs ordered are listed, but only abnormal results are displayed) Labs Reviewed  CBC - Abnormal; Notable for the following components:      Result Value   RBC 3.59 (*)    Hemoglobin 9.7 (*)    HCT 29.9 (*)    Platelets 431 (*)    All other components within normal limits  BASIC METABOLIC PANEL - Abnormal; Notable for the following components:   Sodium 133 (*)    Chloride 97 (*)    Glucose, Bld 157 (*)    Creatinine, Ser 1.11 (*)    GFR, Estimated 52 (*)    All other components within normal limits     EKG    RADIOLOGY    PROCEDURES:  Critical Care performed: No  Procedures   MEDICATIONS ORDERED IN ED: Medications - No data to display   IMPRESSION / MDM / ASSESSMENT AND PLAN / ED COURSE  I reviewed the triage vital  signs and the nursing notes.                              Differential diagnosis includes, but is not limited to, postoperative hematoma, postoperative infection, wound dehiscence.  Patient's presentation is most consistent with severe exacerbation of chronic illness.  Patient is a 77 year old female presenting today for postoperative pain, swelling, and drainage from her right knee total replacement.  See media tab for photo of knee.  There is slight serosanguineous drainage from the site but otherwise incision looks well-healed with no signs of infection.  Laboratory workup is reassuring at this time.  Orthopedic surgery was consulted and evaluated patient at the bedside.  They assume this is most likely just a postoperative hematoma with no further issues.  They have rebandaged the site and patient is safe for outpatient follow-up with them.  Patient was stable for discharge and given strict return precautions.     FINAL CLINICAL IMPRESSION(S) / ED DIAGNOSES   Final diagnoses:  Post-operative pain      Rx / DC Orders   ED Discharge Orders     None        Note:  This document was prepared using Dragon voice recognition software and may include unintentional dictation errors.   Janith Lima, MD 12/09/22 [phone]

## 2022-12-09 NOTE — Discharge Instructions (Addendum)
You were seen in the emergency department today for your postoperative pain on your right knee.  Orthopedic surgery evaluated you and has rewrapped at this time.  Please follow-up for your PT visits on Monday and Wednesday next week as well as your scheduled orthopedic follow-up.  Please return for any worsening symptoms.

## 2022-12-09 NOTE — Progress Notes (Signed)
ORTHOPAEDICS; Patient presented to ED via EMS. Family member had removed dressing and noted significant amount of bloody drainage.  Large degree of swelling to the right LE. Moderate ecchymosis to the knee. Homan's test was negative. Incision well approximated with staples. Focal area of bloody drainage at the midsection of the incision at a staple. No erythema.  New bulky dressing applied using 4x4's, ABDs, cast padding and 6 inch ACE wraps. Patient instructed to keep the legs elevated and resume use of the PolarCare. Reinforce bandage as necessary.  She has a PT appointment at the clinic on Monday. Will reassess at that time.   P. Angie Fava M.D.

## 2022-12-09 NOTE — ED Notes (Signed)
Patient was assisted to wheelchair and then to go to the bathroom. Patient was able to do most of the transfer independently. Patient was assisted to car and was taken home by family.

## 2022-12-09 NOTE — ED Triage Notes (Signed)
Pt to ED [facility] from home for post op swelling and bleeding to right knee after changing bandage today. Denies fevers.  Knee replacement right knee a week ago

## 2022-12-12 ENCOUNTER — Other Ambulatory Visit: Payer: Self-pay | Admitting: Orthopedic Surgery

## 2022-12-12 ENCOUNTER — Ambulatory Visit
Admission: RE | Admit: 2022-12-12 | Discharge: 2022-12-12 | Disposition: A | Discharge status: Home / Self Care | Payer: Medicare Other | Source: Ambulatory Visit | Attending: Orthopedic Surgery | Admitting: Orthopedic Surgery

## 2022-12-12 DIAGNOSIS — M25561 Pain in right knee: Secondary | ICD-10-CM | POA: Diagnosis not present

## 2022-12-12 DIAGNOSIS — R531 Weakness: Secondary | ICD-10-CM | POA: Diagnosis not present

## 2022-12-12 DIAGNOSIS — Z96651 Presence of right artificial knee joint: Secondary | ICD-10-CM | POA: Diagnosis not present

## 2022-12-12 DIAGNOSIS — M7989 Other specified soft tissue disorders: Secondary | ICD-10-CM | POA: Diagnosis not present

## 2022-12-12 DIAGNOSIS — L539 Erythematous condition, unspecified: Secondary | ICD-10-CM

## 2022-12-12 DIAGNOSIS — M25661 Stiffness of right knee, not elsewhere classified: Secondary | ICD-10-CM | POA: Diagnosis not present

## 2022-12-14 DIAGNOSIS — R531 Weakness: Secondary | ICD-10-CM | POA: Diagnosis not present

## 2022-12-14 DIAGNOSIS — Z96651 Presence of right artificial knee joint: Secondary | ICD-10-CM | POA: Diagnosis not present

## 2022-12-14 DIAGNOSIS — M25661 Stiffness of right knee, not elsewhere classified: Secondary | ICD-10-CM | POA: Diagnosis not present

## 2022-12-14 DIAGNOSIS — M25561 Pain in right knee: Secondary | ICD-10-CM | POA: Diagnosis not present

## 2022-12-15 ENCOUNTER — Telehealth: Payer: Self-pay

## 2022-12-15 NOTE — Telephone Encounter (Signed)
Transition Care Management Follow-up Telephone Call Date of discharge and from where:  8/9 How have you been since you were released from the hospital? Doing ok but in pain. Getting stitches out today 8/15 Any questions or concerns? No  Items Reviewed: Did the pt receive and understand the discharge instructions provided? Yes  Medications obtained and verified? Yes  Other? No  Any new allergies since your discharge? No  Dietary orders reviewed? No Do you have support at home? Yes     Follow up appointments reviewed:  PCP Hospital f/u appt confirmed? Yes  Scheduled to see PCP on 8/16 @ . Specialist Hospital f/u appt confirmed? Yes  Scheduled to see  on  @ . Are transportation arrangements needed? No  If their condition worsens, is the pt aware to call PCP or go to the Emergency Dept.? Yes Was the patient provided with contact information for the PCP's office or ED? Yes Was to pt encouraged to call back with questions or concerns? Yes

## 2022-12-21 DIAGNOSIS — M25561 Pain in right knee: Secondary | ICD-10-CM | POA: Diagnosis not present

## 2022-12-21 DIAGNOSIS — Z96651 Presence of right artificial knee joint: Secondary | ICD-10-CM | POA: Diagnosis not present

## 2022-12-21 DIAGNOSIS — R531 Weakness: Secondary | ICD-10-CM | POA: Diagnosis not present

## 2022-12-21 DIAGNOSIS — M25661 Stiffness of right knee, not elsewhere classified: Secondary | ICD-10-CM | POA: Diagnosis not present

## 2022-12-22 DIAGNOSIS — E782 Mixed hyperlipidemia: Secondary | ICD-10-CM | POA: Diagnosis not present

## 2022-12-26 DIAGNOSIS — Z96651 Presence of right artificial knee joint: Secondary | ICD-10-CM | POA: Diagnosis not present

## 2022-12-26 DIAGNOSIS — M25661 Stiffness of right knee, not elsewhere classified: Secondary | ICD-10-CM | POA: Diagnosis not present

## 2022-12-26 DIAGNOSIS — M25561 Pain in right knee: Secondary | ICD-10-CM | POA: Diagnosis not present

## 2022-12-26 DIAGNOSIS — R531 Weakness: Secondary | ICD-10-CM | POA: Diagnosis not present

## 2022-12-28 DIAGNOSIS — M25661 Stiffness of right knee, not elsewhere classified: Secondary | ICD-10-CM | POA: Diagnosis not present

## 2022-12-28 DIAGNOSIS — Z96651 Presence of right artificial knee joint: Secondary | ICD-10-CM | POA: Diagnosis not present

## 2022-12-28 DIAGNOSIS — R531 Weakness: Secondary | ICD-10-CM | POA: Diagnosis not present

## 2022-12-28 DIAGNOSIS — M25561 Pain in right knee: Secondary | ICD-10-CM | POA: Diagnosis not present

## 2023-01-03 DIAGNOSIS — Z96651 Presence of right artificial knee joint: Secondary | ICD-10-CM | POA: Diagnosis not present

## 2023-01-03 DIAGNOSIS — M25561 Pain in right knee: Secondary | ICD-10-CM | POA: Diagnosis not present

## 2023-01-03 DIAGNOSIS — R531 Weakness: Secondary | ICD-10-CM | POA: Diagnosis not present

## 2023-01-03 DIAGNOSIS — M25661 Stiffness of right knee, not elsewhere classified: Secondary | ICD-10-CM | POA: Diagnosis not present

## 2023-01-09 DIAGNOSIS — R531 Weakness: Secondary | ICD-10-CM | POA: Diagnosis not present

## 2023-01-09 DIAGNOSIS — Z96651 Presence of right artificial knee joint: Secondary | ICD-10-CM | POA: Diagnosis not present

## 2023-01-09 DIAGNOSIS — M25661 Stiffness of right knee, not elsewhere classified: Secondary | ICD-10-CM | POA: Diagnosis not present

## 2023-01-09 DIAGNOSIS — M25561 Pain in right knee: Secondary | ICD-10-CM | POA: Diagnosis not present

## 2023-01-10 DIAGNOSIS — E1122 Type 2 diabetes mellitus with diabetic chronic kidney disease: Secondary | ICD-10-CM | POA: Diagnosis not present

## 2023-01-10 DIAGNOSIS — I890 Lymphedema, not elsewhere classified: Secondary | ICD-10-CM | POA: Diagnosis not present

## 2023-01-10 DIAGNOSIS — Z96653 Presence of artificial knee joint, bilateral: Secondary | ICD-10-CM | POA: Diagnosis not present

## 2023-01-10 DIAGNOSIS — L03119 Cellulitis of unspecified part of limb: Secondary | ICD-10-CM | POA: Diagnosis not present

## 2023-01-10 DIAGNOSIS — L02419 Cutaneous abscess of limb, unspecified: Secondary | ICD-10-CM | POA: Diagnosis not present

## 2023-01-10 DIAGNOSIS — I129 Hypertensive chronic kidney disease with stage 1 through stage 4 chronic kidney disease, or unspecified chronic kidney disease: Secondary | ICD-10-CM | POA: Diagnosis not present

## 2023-01-10 DIAGNOSIS — N1831 Chronic kidney disease, stage 3a: Secondary | ICD-10-CM | POA: Diagnosis not present

## 2023-01-11 DIAGNOSIS — M25661 Stiffness of right knee, not elsewhere classified: Secondary | ICD-10-CM | POA: Diagnosis not present

## 2023-01-11 DIAGNOSIS — R531 Weakness: Secondary | ICD-10-CM | POA: Diagnosis not present

## 2023-01-11 DIAGNOSIS — Z96651 Presence of right artificial knee joint: Secondary | ICD-10-CM | POA: Diagnosis not present

## 2023-01-11 DIAGNOSIS — M25561 Pain in right knee: Secondary | ICD-10-CM | POA: Diagnosis not present

## 2023-01-12 DIAGNOSIS — Z96651 Presence of right artificial knee joint: Secondary | ICD-10-CM | POA: Diagnosis not present

## 2023-01-16 DIAGNOSIS — R531 Weakness: Secondary | ICD-10-CM | POA: Diagnosis not present

## 2023-01-16 DIAGNOSIS — Z96651 Presence of right artificial knee joint: Secondary | ICD-10-CM | POA: Diagnosis not present

## 2023-01-16 DIAGNOSIS — M25661 Stiffness of right knee, not elsewhere classified: Secondary | ICD-10-CM | POA: Diagnosis not present

## 2023-01-20 DIAGNOSIS — I272 Pulmonary hypertension, unspecified: Secondary | ICD-10-CM | POA: Diagnosis not present

## 2023-01-20 DIAGNOSIS — I10 Essential (primary) hypertension: Secondary | ICD-10-CM | POA: Diagnosis not present

## 2023-01-20 DIAGNOSIS — I071 Rheumatic tricuspid insufficiency: Secondary | ICD-10-CM | POA: Diagnosis not present

## 2023-01-20 DIAGNOSIS — I493 Ventricular premature depolarization: Secondary | ICD-10-CM | POA: Diagnosis not present

## 2023-01-20 DIAGNOSIS — N1832 Chronic kidney disease, stage 3b: Secondary | ICD-10-CM | POA: Diagnosis not present

## 2023-01-20 DIAGNOSIS — E782 Mixed hyperlipidemia: Secondary | ICD-10-CM | POA: Diagnosis not present

## 2023-01-20 DIAGNOSIS — I119 Hypertensive heart disease without heart failure: Secondary | ICD-10-CM | POA: Diagnosis not present

## 2023-01-24 DIAGNOSIS — M7918 Myalgia, other site: Secondary | ICD-10-CM | POA: Diagnosis not present

## 2023-01-24 DIAGNOSIS — M9903 Segmental and somatic dysfunction of lumbar region: Secondary | ICD-10-CM | POA: Diagnosis not present

## 2023-01-24 DIAGNOSIS — M5136 Other intervertebral disc degeneration, lumbar region: Secondary | ICD-10-CM | POA: Diagnosis not present

## 2023-01-24 DIAGNOSIS — M9905 Segmental and somatic dysfunction of pelvic region: Secondary | ICD-10-CM | POA: Diagnosis not present

## 2023-01-24 DIAGNOSIS — M5451 Vertebrogenic low back pain: Secondary | ICD-10-CM | POA: Diagnosis not present

## 2023-01-24 DIAGNOSIS — M25552 Pain in left hip: Secondary | ICD-10-CM | POA: Diagnosis not present

## 2023-01-24 DIAGNOSIS — M5416 Radiculopathy, lumbar region: Secondary | ICD-10-CM | POA: Diagnosis not present

## 2023-02-14 DIAGNOSIS — M9905 Segmental and somatic dysfunction of pelvic region: Secondary | ICD-10-CM | POA: Diagnosis not present

## 2023-02-14 DIAGNOSIS — M5136 Other intervertebral disc degeneration, lumbar region with discogenic back pain only: Secondary | ICD-10-CM | POA: Diagnosis not present

## 2023-02-14 DIAGNOSIS — M9903 Segmental and somatic dysfunction of lumbar region: Secondary | ICD-10-CM | POA: Diagnosis not present

## 2023-02-14 DIAGNOSIS — M5416 Radiculopathy, lumbar region: Secondary | ICD-10-CM | POA: Diagnosis not present

## 2023-02-14 DIAGNOSIS — M5451 Vertebrogenic low back pain: Secondary | ICD-10-CM | POA: Diagnosis not present

## 2023-02-14 DIAGNOSIS — M25552 Pain in left hip: Secondary | ICD-10-CM | POA: Diagnosis not present

## 2023-02-14 DIAGNOSIS — M7918 Myalgia, other site: Secondary | ICD-10-CM | POA: Diagnosis not present

## 2023-03-07 DIAGNOSIS — D485 Neoplasm of uncertain behavior of skin: Secondary | ICD-10-CM | POA: Diagnosis not present

## 2023-03-07 DIAGNOSIS — D2261 Melanocytic nevi of right upper limb, including shoulder: Secondary | ICD-10-CM | POA: Diagnosis not present

## 2023-03-07 DIAGNOSIS — L821 Other seborrheic keratosis: Secondary | ICD-10-CM | POA: Diagnosis not present

## 2023-03-07 DIAGNOSIS — L570 Actinic keratosis: Secondary | ICD-10-CM | POA: Diagnosis not present

## 2023-03-07 DIAGNOSIS — D2272 Melanocytic nevi of left lower limb, including hip: Secondary | ICD-10-CM | POA: Diagnosis not present

## 2023-03-07 DIAGNOSIS — Z85828 Personal history of other malignant neoplasm of skin: Secondary | ICD-10-CM | POA: Diagnosis not present

## 2023-03-07 DIAGNOSIS — D2262 Melanocytic nevi of left upper limb, including shoulder: Secondary | ICD-10-CM | POA: Diagnosis not present

## 2023-03-07 DIAGNOSIS — D225 Melanocytic nevi of trunk: Secondary | ICD-10-CM | POA: Diagnosis not present

## 2023-03-21 DIAGNOSIS — M5416 Radiculopathy, lumbar region: Secondary | ICD-10-CM | POA: Diagnosis not present

## 2023-03-21 DIAGNOSIS — M7918 Myalgia, other site: Secondary | ICD-10-CM | POA: Diagnosis not present

## 2023-03-21 DIAGNOSIS — M25552 Pain in left hip: Secondary | ICD-10-CM | POA: Diagnosis not present

## 2023-03-21 DIAGNOSIS — M5451 Vertebrogenic low back pain: Secondary | ICD-10-CM | POA: Diagnosis not present

## 2023-03-21 DIAGNOSIS — M5136 Other intervertebral disc degeneration, lumbar region with discogenic back pain only: Secondary | ICD-10-CM | POA: Diagnosis not present

## 2023-03-21 DIAGNOSIS — M9903 Segmental and somatic dysfunction of lumbar region: Secondary | ICD-10-CM | POA: Diagnosis not present

## 2023-03-21 DIAGNOSIS — M9905 Segmental and somatic dysfunction of pelvic region: Secondary | ICD-10-CM | POA: Diagnosis not present

## 2023-04-03 DIAGNOSIS — E113293 Type 2 diabetes mellitus with mild nonproliferative diabetic retinopathy without macular edema, bilateral: Secondary | ICD-10-CM | POA: Diagnosis not present

## 2023-04-03 DIAGNOSIS — Z961 Presence of intraocular lens: Secondary | ICD-10-CM | POA: Diagnosis not present

## 2023-04-03 DIAGNOSIS — Z9842 Cataract extraction status, left eye: Secondary | ICD-10-CM | POA: Diagnosis not present

## 2023-04-03 DIAGNOSIS — Z9841 Cataract extraction status, right eye: Secondary | ICD-10-CM | POA: Diagnosis not present

## 2023-04-03 DIAGNOSIS — H524 Presbyopia: Secondary | ICD-10-CM | POA: Diagnosis not present

## 2023-04-06 DIAGNOSIS — D044 Carcinoma in situ of skin of scalp and neck: Secondary | ICD-10-CM | POA: Diagnosis not present

## 2023-04-07 DIAGNOSIS — E785 Hyperlipidemia, unspecified: Secondary | ICD-10-CM | POA: Diagnosis not present

## 2023-04-07 DIAGNOSIS — I152 Hypertension secondary to endocrine disorders: Secondary | ICD-10-CM | POA: Diagnosis not present

## 2023-04-07 DIAGNOSIS — E1165 Type 2 diabetes mellitus with hyperglycemia: Secondary | ICD-10-CM | POA: Diagnosis not present

## 2023-04-07 DIAGNOSIS — E11319 Type 2 diabetes mellitus with unspecified diabetic retinopathy without macular edema: Secondary | ICD-10-CM | POA: Diagnosis not present

## 2023-04-07 DIAGNOSIS — N1831 Chronic kidney disease, stage 3a: Secondary | ICD-10-CM | POA: Diagnosis not present

## 2023-04-07 DIAGNOSIS — E1159 Type 2 diabetes mellitus with other circulatory complications: Secondary | ICD-10-CM | POA: Diagnosis not present

## 2023-04-07 DIAGNOSIS — E1169 Type 2 diabetes mellitus with other specified complication: Secondary | ICD-10-CM | POA: Diagnosis not present

## 2023-04-07 DIAGNOSIS — E1122 Type 2 diabetes mellitus with diabetic chronic kidney disease: Secondary | ICD-10-CM | POA: Diagnosis not present

## 2023-04-11 DIAGNOSIS — C4441 Basal cell carcinoma of skin of scalp and neck: Secondary | ICD-10-CM | POA: Diagnosis not present

## 2023-04-12 DIAGNOSIS — N189 Chronic kidney disease, unspecified: Secondary | ICD-10-CM | POA: Diagnosis not present

## 2023-04-12 DIAGNOSIS — E1122 Type 2 diabetes mellitus with diabetic chronic kidney disease: Secondary | ICD-10-CM | POA: Diagnosis not present

## 2023-04-12 DIAGNOSIS — I10 Essential (primary) hypertension: Secondary | ICD-10-CM | POA: Diagnosis not present

## 2023-04-12 DIAGNOSIS — R600 Localized edema: Secondary | ICD-10-CM | POA: Diagnosis not present

## 2023-04-12 DIAGNOSIS — N1831 Chronic kidney disease, stage 3a: Secondary | ICD-10-CM | POA: Diagnosis not present

## 2023-04-17 DIAGNOSIS — I10 Essential (primary) hypertension: Secondary | ICD-10-CM | POA: Diagnosis not present

## 2023-04-17 DIAGNOSIS — N189 Chronic kidney disease, unspecified: Secondary | ICD-10-CM | POA: Diagnosis not present

## 2023-04-17 DIAGNOSIS — R600 Localized edema: Secondary | ICD-10-CM | POA: Diagnosis not present

## 2023-04-17 DIAGNOSIS — N1831 Chronic kidney disease, stage 3a: Secondary | ICD-10-CM | POA: Diagnosis not present

## 2023-04-17 DIAGNOSIS — E1122 Type 2 diabetes mellitus with diabetic chronic kidney disease: Secondary | ICD-10-CM | POA: Diagnosis not present

## 2023-04-18 DIAGNOSIS — M25552 Pain in left hip: Secondary | ICD-10-CM | POA: Diagnosis not present

## 2023-04-18 DIAGNOSIS — M5136 Other intervertebral disc degeneration, lumbar region with discogenic back pain only: Secondary | ICD-10-CM | POA: Diagnosis not present

## 2023-04-18 DIAGNOSIS — M9903 Segmental and somatic dysfunction of lumbar region: Secondary | ICD-10-CM | POA: Diagnosis not present

## 2023-04-18 DIAGNOSIS — M5416 Radiculopathy, lumbar region: Secondary | ICD-10-CM | POA: Diagnosis not present

## 2023-04-18 DIAGNOSIS — M7918 Myalgia, other site: Secondary | ICD-10-CM | POA: Diagnosis not present

## 2023-04-18 DIAGNOSIS — M5451 Vertebrogenic low back pain: Secondary | ICD-10-CM | POA: Diagnosis not present

## 2023-04-18 DIAGNOSIS — M9905 Segmental and somatic dysfunction of pelvic region: Secondary | ICD-10-CM | POA: Diagnosis not present

## 2023-05-26 ENCOUNTER — Other Ambulatory Visit: Payer: Self-pay | Admitting: Family Medicine

## 2023-05-26 DIAGNOSIS — Z1231 Encounter for screening mammogram for malignant neoplasm of breast: Secondary | ICD-10-CM

## 2023-07-10 ENCOUNTER — Ambulatory Visit
Admission: RE | Admit: 2023-07-10 | Discharge: 2023-07-10 | Disposition: A | Discharge status: Home / Self Care | Payer: Medicare Other | Source: Ambulatory Visit | Attending: Family Medicine | Admitting: Family Medicine

## 2023-07-10 DIAGNOSIS — Z1231 Encounter for screening mammogram for malignant neoplasm of breast: Secondary | ICD-10-CM | POA: Insufficient documentation

## 2024-03-21 DIAGNOSIS — I10 Essential (primary) hypertension: Secondary | ICD-10-CM | POA: Diagnosis not present

## 2024-03-21 DIAGNOSIS — I890 Lymphedema, not elsewhere classified: Secondary | ICD-10-CM | POA: Diagnosis not present

## 2024-03-21 DIAGNOSIS — G894 Chronic pain syndrome: Secondary | ICD-10-CM | POA: Diagnosis not present

## 2024-03-21 DIAGNOSIS — E1122 Type 2 diabetes mellitus with diabetic chronic kidney disease: Secondary | ICD-10-CM | POA: Diagnosis not present

## 2024-03-21 DIAGNOSIS — Z1159 Encounter for screening for other viral diseases: Secondary | ICD-10-CM | POA: Diagnosis not present

## 2024-03-21 DIAGNOSIS — Z1331 Encounter for screening for depression: Secondary | ICD-10-CM | POA: Diagnosis not present

## 2024-03-21 DIAGNOSIS — E2839 Other primary ovarian failure: Secondary | ICD-10-CM | POA: Diagnosis not present

## 2024-03-21 DIAGNOSIS — E782 Mixed hyperlipidemia: Secondary | ICD-10-CM | POA: Diagnosis not present

## 2024-03-21 DIAGNOSIS — N184 Chronic kidney disease, stage 4 (severe): Secondary | ICD-10-CM | POA: Diagnosis not present

## 2024-03-21 DIAGNOSIS — M47816 Spondylosis without myelopathy or radiculopathy, lumbar region: Secondary | ICD-10-CM | POA: Diagnosis not present

## 2024-03-21 DIAGNOSIS — E538 Deficiency of other specified B group vitamins: Secondary | ICD-10-CM | POA: Diagnosis not present

## 2024-03-21 DIAGNOSIS — I272 Pulmonary hypertension, unspecified: Secondary | ICD-10-CM | POA: Diagnosis not present

## 2024-04-02 DIAGNOSIS — Z9841 Cataract extraction status, right eye: Secondary | ICD-10-CM | POA: Diagnosis not present

## 2024-04-02 DIAGNOSIS — Z9842 Cataract extraction status, left eye: Secondary | ICD-10-CM | POA: Diagnosis not present

## 2024-04-02 DIAGNOSIS — H524 Presbyopia: Secondary | ICD-10-CM | POA: Diagnosis not present

## 2024-04-02 DIAGNOSIS — E113293 Type 2 diabetes mellitus with mild nonproliferative diabetic retinopathy without macular edema, bilateral: Secondary | ICD-10-CM | POA: Diagnosis not present

## 2024-04-02 DIAGNOSIS — Z961 Presence of intraocular lens: Secondary | ICD-10-CM | POA: Diagnosis not present

## 2024-04-08 ENCOUNTER — Other Ambulatory Visit: Payer: Self-pay | Admitting: Family Medicine

## 2024-04-08 DIAGNOSIS — E2839 Other primary ovarian failure: Secondary | ICD-10-CM

## 2024-04-30 ENCOUNTER — Other Ambulatory Visit: Payer: Self-pay | Admitting: Family Medicine

## 2024-04-30 DIAGNOSIS — Z1231 Encounter for screening mammogram for malignant neoplasm of breast: Secondary | ICD-10-CM

## 2024-07-10 ENCOUNTER — Ambulatory Visit
